# Patient Record
Sex: Male | Born: 1935
Health system: Southern US, Community
[De-identification: ages and names within clinical notes are randomized; demographics above are authoritative.]

## PROBLEM LIST (undated history)

## (undated) DIAGNOSIS — R590 Localized enlarged lymph nodes: Secondary | ICD-10-CM

## (undated) DIAGNOSIS — I421 Obstructive hypertrophic cardiomyopathy: Secondary | ICD-10-CM

## (undated) DIAGNOSIS — E7211 Homocystinuria: Secondary | ICD-10-CM

## (undated) DIAGNOSIS — I639 Cerebral infarction, unspecified: Secondary | ICD-10-CM

## (undated) DIAGNOSIS — I2510 Atherosclerotic heart disease of native coronary artery without angina pectoris: Secondary | ICD-10-CM

## (undated) DIAGNOSIS — Z9889 Other specified postprocedural states: Secondary | ICD-10-CM

## (undated) DIAGNOSIS — N2889 Other specified disorders of kidney and ureter: Secondary | ICD-10-CM

## (undated) DIAGNOSIS — I700 Atherosclerosis of aorta: Secondary | ICD-10-CM

## (undated) DIAGNOSIS — R519 Headache, unspecified: Secondary | ICD-10-CM

## (undated) DIAGNOSIS — R51 Headache: Secondary | ICD-10-CM

## (undated) DIAGNOSIS — I350 Nonrheumatic aortic (valve) stenosis: Secondary | ICD-10-CM

## (undated) DIAGNOSIS — I340 Nonrheumatic mitral (valve) insufficiency: Secondary | ICD-10-CM

## (undated) DIAGNOSIS — T753XXA Motion sickness, initial encounter: Secondary | ICD-10-CM

## (undated) DIAGNOSIS — K264 Chronic or unspecified duodenal ulcer with hemorrhage: Secondary | ICD-10-CM

## (undated) DIAGNOSIS — R7983 Abnormal findings of blood amino-acid level: Secondary | ICD-10-CM

## (undated) DIAGNOSIS — I442 Atrioventricular block, complete: Secondary | ICD-10-CM

## (undated) DIAGNOSIS — R7989 Other specified abnormal findings of blood chemistry: Secondary | ICD-10-CM

## (undated) DIAGNOSIS — C679 Malignant neoplasm of bladder, unspecified: Secondary | ICD-10-CM

## (undated) DIAGNOSIS — G4733 Obstructive sleep apnea (adult) (pediatric): Secondary | ICD-10-CM

## (undated) DIAGNOSIS — R2681 Unsteadiness on feet: Secondary | ICD-10-CM

## (undated) DIAGNOSIS — H8301 Labyrinthitis, right ear: Secondary | ICD-10-CM

## (undated) DIAGNOSIS — I4891 Unspecified atrial fibrillation: Secondary | ICD-10-CM

## (undated) DIAGNOSIS — G4730 Sleep apnea, unspecified: Secondary | ICD-10-CM

## (undated) DIAGNOSIS — R110 Nausea: Secondary | ICD-10-CM

## (undated) DIAGNOSIS — E1140 Type 2 diabetes mellitus with diabetic neuropathy, unspecified: Secondary | ICD-10-CM

## (undated) DIAGNOSIS — R748 Abnormal levels of other serum enzymes: Secondary | ICD-10-CM

## (undated) DIAGNOSIS — S0990XA Unspecified injury of head, initial encounter: Secondary | ICD-10-CM

## (undated) DIAGNOSIS — I739 Peripheral vascular disease, unspecified: Secondary | ICD-10-CM

## (undated) DIAGNOSIS — I951 Orthostatic hypotension: Secondary | ICD-10-CM

## (undated) DIAGNOSIS — I779 Disorder of arteries and arterioles, unspecified: Secondary | ICD-10-CM

## (undated) DIAGNOSIS — R61 Generalized hyperhidrosis: Secondary | ICD-10-CM

## (undated) DIAGNOSIS — K922 Gastrointestinal hemorrhage, unspecified: Secondary | ICD-10-CM

## (undated) DIAGNOSIS — E7800 Pure hypercholesterolemia, unspecified: Secondary | ICD-10-CM

## (undated) DIAGNOSIS — I10 Essential (primary) hypertension: Secondary | ICD-10-CM

## (undated) HISTORY — DX: Atherosclerotic heart disease of native coronary artery without angina pectoris: I25.10

## (undated) HISTORY — DX: Nausea: R11.0

## (undated) HISTORY — DX: Nonrheumatic aortic (valve) stenosis: I35.0

## (undated) HISTORY — DX: Localized enlarged lymph nodes: R59.0

## (undated) HISTORY — DX: Sleep apnea, unspecified: G47.30

## (undated) HISTORY — DX: Labyrinthitis, right ear: H83.01

## (undated) HISTORY — DX: Atherosclerosis of aorta: I70.0

## (undated) HISTORY — PX: INSERT / REPLACE / REMOVE PACEMAKER: SUR710

## (undated) HISTORY — DX: Chronic or unspecified duodenal ulcer with hemorrhage: K26.4

## (undated) HISTORY — DX: Gastrointestinal hemorrhage, unspecified: K92.2

## (undated) HISTORY — DX: Obstructive sleep apnea (adult) (pediatric): G47.33

## (undated) HISTORY — DX: Atrioventricular block, complete: I44.2

## (undated) HISTORY — DX: Obstructive hypertrophic cardiomyopathy: I42.1

## (undated) HISTORY — DX: Malignant neoplasm of bladder, unspecified: C67.9

## (undated) HISTORY — PX: BLADDER SURGERY: SHX569

## (undated) HISTORY — DX: Pure hypercholesterolemia, unspecified: E78.00

## (undated) HISTORY — DX: Unsteadiness on feet: R26.81

## (undated) HISTORY — DX: Unspecified injury of head, initial encounter: S09.90XA

## (undated) HISTORY — DX: Other specified abnormal findings of blood chemistry: R79.89

## (undated) HISTORY — DX: Abnormal levels of other serum enzymes: R74.8

## (undated) HISTORY — DX: Peripheral vascular disease, unspecified: I73.9

## (undated) HISTORY — PX: TONSILLECTOMY: SUR1361

## (undated) HISTORY — DX: Other specified disorders of kidney and ureter: N28.89

## (undated) HISTORY — DX: Nonrheumatic mitral (valve) insufficiency: I34.0

## (undated) HISTORY — PX: KIDNEY SURGERY: SHX687

## (undated) HISTORY — DX: Cerebral infarction, unspecified: I63.9

## (undated) HISTORY — PX: VALVE REPLACEMENT: SUR13

## (undated) HISTORY — DX: Unspecified atrial fibrillation: I48.91

## (undated) HISTORY — DX: Homocystinuria: E72.11

## (undated) HISTORY — DX: Abnormal findings of blood amino-acid level: R79.83

## (undated) HISTORY — DX: Other specified postprocedural states: Z98.890

## (undated) HISTORY — DX: Disorder of arteries and arterioles, unspecified: I77.9

## (undated) HISTORY — DX: Orthostatic hypotension: I95.1

## (undated) HISTORY — DX: Generalized hyperhidrosis: R61

---

## 1999-06-22 ENCOUNTER — Inpatient Hospital Stay (HOSPITAL_COMMUNITY): Admission: AD | Admit: 1999-06-22 | Discharge: 1999-06-24 | Payer: Self-pay | Admitting: Cardiology

## 2002-09-09 ENCOUNTER — Ambulatory Visit (HOSPITAL_COMMUNITY): Admission: RE | Admit: 2002-09-09 | Discharge: 2002-09-09 | Payer: Self-pay | Admitting: Cardiology

## 2002-11-14 ENCOUNTER — Inpatient Hospital Stay (HOSPITAL_COMMUNITY): Admission: AD | Admit: 2002-11-14 | Discharge: 2002-11-20 | Payer: Self-pay | Admitting: Neurosurgery

## 2002-11-15 ENCOUNTER — Encounter: Payer: Self-pay | Admitting: Neurosurgery

## 2002-11-17 ENCOUNTER — Encounter: Payer: Self-pay | Admitting: Neurosurgery

## 2002-12-08 ENCOUNTER — Ambulatory Visit (HOSPITAL_COMMUNITY): Admission: RE | Admit: 2002-12-08 | Discharge: 2002-12-08 | Payer: Self-pay | Admitting: Neurosurgery

## 2002-12-08 ENCOUNTER — Encounter: Payer: Self-pay | Admitting: Neurosurgery

## 2004-08-17 ENCOUNTER — Ambulatory Visit: Payer: Self-pay | Admitting: Internal Medicine

## 2004-08-25 ENCOUNTER — Ambulatory Visit: Payer: Self-pay | Admitting: Cardiology

## 2004-09-18 ENCOUNTER — Ambulatory Visit: Payer: Self-pay | Admitting: Cardiology

## 2004-10-03 ENCOUNTER — Ambulatory Visit: Payer: Self-pay | Admitting: Internal Medicine

## 2004-10-18 ENCOUNTER — Ambulatory Visit: Payer: Self-pay | Admitting: Cardiology

## 2004-11-07 ENCOUNTER — Ambulatory Visit: Payer: Self-pay | Admitting: Cardiology

## 2004-11-29 ENCOUNTER — Ambulatory Visit: Payer: Self-pay | Admitting: Cardiovascular Disease

## 2004-12-27 ENCOUNTER — Ambulatory Visit: Payer: Self-pay | Admitting: *Deleted

## 2005-01-09 ENCOUNTER — Ambulatory Visit: Payer: Self-pay | Admitting: Cardiology

## 2005-01-24 ENCOUNTER — Ambulatory Visit: Payer: Self-pay | Admitting: Internal Medicine

## 2005-02-21 ENCOUNTER — Ambulatory Visit: Payer: Self-pay | Admitting: Cardiology

## 2005-03-27 ENCOUNTER — Ambulatory Visit: Payer: Self-pay | Admitting: Internal Medicine

## 2005-05-01 ENCOUNTER — Ambulatory Visit: Payer: Self-pay | Admitting: Cardiology

## 2005-05-29 ENCOUNTER — Ambulatory Visit: Payer: Self-pay | Admitting: Cardiology

## 2005-06-19 ENCOUNTER — Ambulatory Visit: Payer: Self-pay | Admitting: Cardiology

## 2005-07-18 ENCOUNTER — Ambulatory Visit: Payer: Self-pay | Admitting: Cardiology

## 2005-08-22 ENCOUNTER — Ambulatory Visit: Payer: Self-pay | Admitting: Cardiology

## 2005-09-17 ENCOUNTER — Ambulatory Visit: Payer: Self-pay | Admitting: Cardiology

## 2005-10-10 ENCOUNTER — Ambulatory Visit: Payer: Self-pay | Admitting: *Deleted

## 2005-10-24 ENCOUNTER — Ambulatory Visit: Payer: Self-pay | Admitting: *Deleted

## 2005-11-21 ENCOUNTER — Ambulatory Visit: Payer: Self-pay | Admitting: Cardiology

## 2005-12-12 ENCOUNTER — Ambulatory Visit: Payer: Self-pay | Admitting: Internal Medicine

## 2006-01-02 ENCOUNTER — Ambulatory Visit: Payer: Self-pay | Admitting: Cardiology

## 2006-01-30 ENCOUNTER — Ambulatory Visit: Payer: Self-pay | Admitting: *Deleted

## 2006-02-27 ENCOUNTER — Ambulatory Visit: Payer: Self-pay | Admitting: Cardiology

## 2006-03-27 ENCOUNTER — Ambulatory Visit: Payer: Self-pay | Admitting: Cardiovascular Disease

## 2006-04-24 ENCOUNTER — Ambulatory Visit: Payer: Self-pay | Admitting: Internal Medicine

## 2006-05-03 ENCOUNTER — Ambulatory Visit: Payer: Self-pay | Admitting: Cardiology

## 2006-05-15 ENCOUNTER — Ambulatory Visit: Payer: Self-pay | Admitting: Cardiology

## 2006-05-24 ENCOUNTER — Ambulatory Visit: Payer: Self-pay | Admitting: Cardiology

## 2006-06-21 ENCOUNTER — Ambulatory Visit: Payer: Self-pay | Admitting: Cardiology

## 2006-07-12 ENCOUNTER — Ambulatory Visit: Payer: Self-pay | Admitting: Cardiology

## 2006-07-26 ENCOUNTER — Ambulatory Visit: Payer: Self-pay | Admitting: Cardiology

## 2006-08-26 ENCOUNTER — Ambulatory Visit: Payer: Self-pay | Admitting: Cardiovascular Disease

## 2006-09-18 ENCOUNTER — Ambulatory Visit: Payer: Self-pay | Admitting: Cardiology

## 2006-10-16 ENCOUNTER — Ambulatory Visit: Payer: Self-pay | Admitting: *Deleted

## 2006-11-13 ENCOUNTER — Ambulatory Visit: Payer: Self-pay | Admitting: Internal Medicine

## 2006-11-27 ENCOUNTER — Ambulatory Visit: Payer: Self-pay | Admitting: *Deleted

## 2006-12-25 ENCOUNTER — Ambulatory Visit: Payer: Self-pay | Admitting: Cardiology

## 2007-01-01 ENCOUNTER — Ambulatory Visit: Payer: Self-pay | Admitting: Cardiovascular Disease

## 2007-01-27 ENCOUNTER — Ambulatory Visit: Payer: Self-pay | Admitting: Cardiology

## 2007-03-06 ENCOUNTER — Ambulatory Visit: Payer: Self-pay | Admitting: Cardiology

## 2007-03-21 ENCOUNTER — Ambulatory Visit: Payer: Self-pay | Admitting: Cardiology

## 2007-04-08 ENCOUNTER — Ambulatory Visit: Payer: Self-pay | Admitting: Cardiology

## 2007-05-06 ENCOUNTER — Ambulatory Visit: Payer: Self-pay | Admitting: Internal Medicine

## 2007-06-04 ENCOUNTER — Ambulatory Visit: Payer: Self-pay | Admitting: Cardiology

## 2007-07-02 ENCOUNTER — Ambulatory Visit: Payer: Self-pay | Admitting: Internal Medicine

## 2007-07-16 ENCOUNTER — Ambulatory Visit: Payer: Self-pay | Admitting: Cardiology

## 2007-08-12 ENCOUNTER — Ambulatory Visit: Payer: Self-pay | Admitting: Cardiology

## 2007-08-20 ENCOUNTER — Ambulatory Visit: Payer: Self-pay | Admitting: Cardiology

## 2007-09-09 ENCOUNTER — Ambulatory Visit: Payer: Self-pay | Admitting: Internal Medicine

## 2007-10-06 ENCOUNTER — Ambulatory Visit: Payer: Self-pay | Admitting: Cardiology

## 2007-11-03 ENCOUNTER — Ambulatory Visit: Payer: Self-pay | Admitting: Cardiology

## 2007-12-01 ENCOUNTER — Ambulatory Visit: Payer: Self-pay | Admitting: Cardiovascular Disease

## 2007-12-22 ENCOUNTER — Ambulatory Visit: Payer: Self-pay | Admitting: Cardiology

## 2008-01-19 ENCOUNTER — Ambulatory Visit: Payer: Self-pay | Admitting: Cardiovascular Disease

## 2008-02-09 ENCOUNTER — Ambulatory Visit: Payer: Self-pay | Admitting: Cardiovascular Disease

## 2008-02-16 ENCOUNTER — Ambulatory Visit: Payer: Self-pay | Admitting: Cardiology

## 2008-03-05 ENCOUNTER — Ambulatory Visit: Payer: Self-pay | Admitting: Cardiovascular Disease

## 2008-04-13 ENCOUNTER — Ambulatory Visit: Payer: Self-pay | Admitting: Cardiology

## 2008-05-07 ENCOUNTER — Ambulatory Visit: Payer: Self-pay | Admitting: Cardiology

## 2008-06-04 ENCOUNTER — Ambulatory Visit: Payer: Self-pay | Admitting: Internal Medicine

## 2008-06-28 ENCOUNTER — Ambulatory Visit: Payer: Self-pay | Admitting: Cardiology

## 2008-07-23 ENCOUNTER — Ambulatory Visit: Payer: Self-pay | Admitting: Internal Medicine

## 2008-08-19 ENCOUNTER — Ambulatory Visit: Payer: Self-pay | Admitting: Cardiology

## 2008-08-27 ENCOUNTER — Encounter: Payer: Self-pay | Admitting: Cardiology

## 2008-08-27 ENCOUNTER — Ambulatory Visit: Payer: Self-pay

## 2008-09-15 ENCOUNTER — Ambulatory Visit: Payer: Self-pay | Admitting: Internal Medicine

## 2008-10-13 ENCOUNTER — Ambulatory Visit: Payer: Self-pay | Admitting: Internal Medicine

## 2008-11-08 ENCOUNTER — Ambulatory Visit: Payer: Self-pay | Admitting: Cardiology

## 2008-12-06 ENCOUNTER — Ambulatory Visit: Payer: Self-pay | Admitting: Cardiology

## 2009-01-03 ENCOUNTER — Ambulatory Visit: Payer: Self-pay | Admitting: Cardiology

## 2009-01-28 ENCOUNTER — Ambulatory Visit: Payer: Self-pay | Admitting: Internal Medicine

## 2009-02-25 ENCOUNTER — Ambulatory Visit: Payer: Self-pay | Admitting: Cardiology

## 2009-03-05 DIAGNOSIS — E119 Type 2 diabetes mellitus without complications: Secondary | ICD-10-CM | POA: Insufficient documentation

## 2009-03-05 DIAGNOSIS — I4891 Unspecified atrial fibrillation: Secondary | ICD-10-CM | POA: Insufficient documentation

## 2009-03-05 DIAGNOSIS — I4892 Unspecified atrial flutter: Secondary | ICD-10-CM

## 2009-03-05 DIAGNOSIS — E785 Hyperlipidemia, unspecified: Secondary | ICD-10-CM | POA: Insufficient documentation

## 2009-03-05 DIAGNOSIS — I482 Chronic atrial fibrillation, unspecified: Secondary | ICD-10-CM

## 2009-03-05 DIAGNOSIS — I080 Rheumatic disorders of both mitral and aortic valves: Secondary | ICD-10-CM | POA: Insufficient documentation

## 2009-03-07 ENCOUNTER — Ambulatory Visit: Payer: Self-pay | Admitting: Cardiology

## 2009-03-07 DIAGNOSIS — E663 Overweight: Secondary | ICD-10-CM | POA: Insufficient documentation

## 2009-03-15 ENCOUNTER — Encounter: Payer: Self-pay | Admitting: *Deleted

## 2009-03-25 ENCOUNTER — Ambulatory Visit: Payer: Self-pay | Admitting: Cardiovascular Disease

## 2009-03-25 LAB — CONVERTED CEMR LAB
POC INR: 2.2
Protime: 18.1

## 2009-03-31 ENCOUNTER — Encounter: Payer: Self-pay | Admitting: Cardiology

## 2009-04-20 ENCOUNTER — Encounter: Payer: Self-pay | Admitting: *Deleted

## 2009-04-20 ENCOUNTER — Ambulatory Visit: Payer: Self-pay | Admitting: Internal Medicine

## 2009-04-20 LAB — CONVERTED CEMR LAB
POC INR: 1.7
Prothrombin Time: 15.9 s

## 2009-05-11 ENCOUNTER — Ambulatory Visit: Payer: Self-pay | Admitting: Cardiology

## 2009-05-11 LAB — CONVERTED CEMR LAB
POC INR: 1.8
Prothrombin Time: 16.5 s

## 2009-05-30 ENCOUNTER — Ambulatory Visit: Payer: Self-pay | Admitting: Cardiovascular Disease

## 2009-05-30 LAB — CONVERTED CEMR LAB: POC INR: 2.1

## 2009-06-24 ENCOUNTER — Ambulatory Visit: Payer: Self-pay | Admitting: Internal Medicine

## 2009-06-24 LAB — CONVERTED CEMR LAB: POC INR: 2.1

## 2009-07-22 ENCOUNTER — Ambulatory Visit: Payer: Self-pay | Admitting: Internal Medicine

## 2009-07-22 LAB — CONVERTED CEMR LAB: POC INR: 2.5

## 2009-08-19 ENCOUNTER — Ambulatory Visit: Payer: Self-pay | Admitting: Cardiovascular Disease

## 2009-08-19 LAB — CONVERTED CEMR LAB: POC INR: 3.1

## 2009-08-25 ENCOUNTER — Encounter: Payer: Self-pay | Admitting: Cardiology

## 2009-08-26 ENCOUNTER — Ambulatory Visit: Payer: Self-pay | Admitting: Cardiology

## 2009-08-26 LAB — CONVERTED CEMR LAB: POC INR: 2.5

## 2009-09-05 ENCOUNTER — Encounter: Payer: Self-pay | Admitting: Cardiology

## 2009-09-23 ENCOUNTER — Ambulatory Visit: Payer: Self-pay | Admitting: Cardiology

## 2009-09-23 LAB — CONVERTED CEMR LAB: POC INR: 3

## 2009-09-30 ENCOUNTER — Ambulatory Visit: Payer: Self-pay | Admitting: Cardiovascular Disease

## 2009-09-30 LAB — CONVERTED CEMR LAB: POC INR: 2.9

## 2009-10-13 ENCOUNTER — Ambulatory Visit: Payer: Self-pay | Admitting: Internal Medicine

## 2009-10-13 LAB — CONVERTED CEMR LAB: POC INR: 3.2

## 2009-10-21 ENCOUNTER — Ambulatory Visit: Payer: Self-pay | Admitting: Cardiology

## 2009-11-14 ENCOUNTER — Ambulatory Visit: Payer: Self-pay | Admitting: Cardiology

## 2009-12-02 ENCOUNTER — Ambulatory Visit: Payer: Self-pay | Admitting: Cardiology

## 2009-12-02 LAB — CONVERTED CEMR LAB: POC INR: 2.2

## 2009-12-30 ENCOUNTER — Ambulatory Visit: Payer: Self-pay | Admitting: Cardiovascular Disease

## 2010-01-27 ENCOUNTER — Ambulatory Visit: Payer: Self-pay | Admitting: Internal Medicine

## 2010-02-17 ENCOUNTER — Ambulatory Visit: Payer: Self-pay | Admitting: Cardiology

## 2010-02-24 ENCOUNTER — Ambulatory Visit: Payer: Self-pay | Admitting: Cardiology

## 2010-02-26 ENCOUNTER — Emergency Department: Payer: Self-pay | Admitting: Emergency Medicine

## 2010-03-01 ENCOUNTER — Emergency Department: Payer: Self-pay | Admitting: Emergency Medicine

## 2010-03-05 ENCOUNTER — Emergency Department: Payer: Self-pay

## 2010-03-12 ENCOUNTER — Emergency Department: Payer: Self-pay | Admitting: Emergency Medicine

## 2010-03-24 ENCOUNTER — Ambulatory Visit: Payer: Self-pay | Admitting: Cardiovascular Disease

## 2010-04-20 ENCOUNTER — Ambulatory Visit: Payer: Self-pay | Admitting: Internal Medicine

## 2010-04-20 LAB — CONVERTED CEMR LAB: POC INR: 2.6

## 2010-05-18 ENCOUNTER — Ambulatory Visit: Payer: Self-pay | Admitting: Cardiovascular Disease

## 2010-06-16 ENCOUNTER — Ambulatory Visit: Payer: Self-pay | Admitting: Cardiology

## 2010-06-16 LAB — CONVERTED CEMR LAB: POC INR: 2.6

## 2010-06-30 ENCOUNTER — Ambulatory Visit: Payer: Self-pay | Admitting: Cardiology

## 2010-06-30 LAB — CONVERTED CEMR LAB: POC INR: 2.3

## 2010-07-28 ENCOUNTER — Ambulatory Visit: Payer: Self-pay | Admitting: Cardiology

## 2010-07-28 LAB — CONVERTED CEMR LAB: POC INR: 2.4

## 2010-08-28 ENCOUNTER — Ambulatory Visit: Payer: Self-pay | Admitting: Cardiovascular Disease

## 2010-08-28 ENCOUNTER — Observation Stay: Payer: Self-pay | Admitting: *Deleted

## 2010-09-01 ENCOUNTER — Ambulatory Visit: Payer: Self-pay | Admitting: Cardiology

## 2010-09-01 DIAGNOSIS — Z8679 Personal history of other diseases of the circulatory system: Secondary | ICD-10-CM | POA: Insufficient documentation

## 2010-09-29 ENCOUNTER — Ambulatory Visit: Payer: Self-pay | Admitting: Cardiology

## 2010-09-29 LAB — CONVERTED CEMR LAB: POC INR: 1.9

## 2010-10-11 ENCOUNTER — Ambulatory Visit: Payer: Self-pay | Admitting: Cardiology

## 2010-10-27 ENCOUNTER — Ambulatory Visit: Admission: RE | Admit: 2010-10-27 | Discharge: 2010-10-27 | Payer: Self-pay | Source: Home / Self Care

## 2010-10-27 LAB — CONVERTED CEMR LAB
INR: 2.6
POC INR: 2.6

## 2010-11-15 ENCOUNTER — Encounter: Payer: Self-pay | Admitting: Cardiology

## 2010-11-15 ENCOUNTER — Ambulatory Visit: Payer: Self-pay | Admitting: Ophthalmology

## 2010-11-16 NOTE — Medication Information (Signed)
Summary: rov/ewj  Anticoagulant Therapy  Managed by: Cloyde Reams, RN Referring MD: Willa Rough MD PCP: DR Donnetta Hutching MD: Shirlee Latch MD, Omarri Eich Indication 1: Atrial Fibrillation (ICD-427.31) Indication 2: Cardiomyopathy (ICD-425) Lab Used: [facility] Site: [facility] INR POC 2.2 INR RANGE 2 - 2.5    Bleeding/hemorrhagic complications: no    Recent/future hospitalizations: no    Any changes in medication regimen? yes       Details: Took prednisone 5 day taper for gout.  Completed  Recent/future dental: no  Any missed doses?: no       Is patient compliant with meds? yes       Allergies (verified): 1)  ! * Mycians  Anticoagulation Management History:      The patient is taking warfarin and comes in today for a routine follow up visit.  Positive risk factors for bleeding include an age of 75 years or older and presence of serious comorbidities.  The bleeding index is 'intermediate risk'.  Positive CHADS2 values include History of Diabetes.  Negative CHADS2 values include Age > 52 years old.  The start date was 02/15/1998.  His last INR was 3.  Anticoagulation responsible provider: Shirlee Latch MD, Sumaiya Arruda.  INR POC: 2.2.  Cuvette Lot#: [id number].  Exp: 01/2011.    Anticoagulation Management Assessment/Plan:      The patient's current anticoagulation dose is Coumadin 5 mg tabs: take as directed.  The target INR is 2 - 2.5.  The next INR is due 12/30/2009.  Anticoagulation instructions were given to patient/spouse.  Results were reviewed/authorized by Cloyde Reams, RN.  He was notified by Cloyde Reams RN.         Prior Anticoagulation Instructions: INR 2.2  Continue on same dosage 1 tablet daily except 1/2 tablet on Sundays, Wednesdays, and Fridays.  Recheck in 3 weeks.    Current Anticoagulation Instructions: INR 2.2  Continue on same dosage 1 tablet daily except 1/2 tablet on Sundays, Wednesdays, and Fridays.  Recheck in 4 weeks.

## 2010-11-16 NOTE — Medication Information (Signed)
Summary: rov/ewj  Anticoagulant Therapy  Managed by: Eda Keys, PharmD Referring MD: Willa Rough MD PCP: DR Donnetta Hutching MD: Clifton James MD, Cristal Deer Indication 1: Atrial Fibrillation (ICD-427.31) Indication 2: Cardiomyopathy (ICD-425) Lab Used: [facility] Site: [facility] INR RANGE 2 - 2.5  Dietary changes: no    Health status changes: no    Bleeding/hemorrhagic complications: no    Recent/future hospitalizations: no    Any changes in medication regimen? yes       Details: px dose of abx before dental procedure.  Recent/future dental: no  Any missed doses?: no       Is patient compliant with meds? yes       Allergies: 1)  ! * Mycians  Anticoagulation Management History:      The patient is taking warfarin and comes in today for a routine follow up visit.  Positive risk factors for bleeding include an age of 75 years or older and presence of serious comorbidities.  The bleeding index is 'intermediate risk'.  Positive CHADS2 values include History of Diabetes.  Negative CHADS2 values include Age > 75 years old.  The start date was 02/15/1998.  His last INR was 3.  Anticoagulation responsible provider: Clifton James MD, Cristal Deer.  Cuvette Lot#: [id number].  Exp: 02/2011.    Anticoagulation Management Assessment/Plan:      The patient's current anticoagulation dose is Coumadin 5 mg tabs: take as directed.  The target INR is 2 - 2.5.  The next INR is due 01/27/2010.  Anticoagulation instructions were given to patient/spouse.  Results were reviewed/authorized by Eda Keys, PharmD.  He was notified by Eda Keys.         Prior Anticoagulation Instructions: INR 2.2  Continue on same dosage 1 tablet daily except 1/2 tablet on Sundays, Wednesdays, and Fridays.  Recheck in 4 weeks.    Current Anticoagulation Instructions: INR 2.5  Continue taking 1/2 tablet on Sunday, Wednesday, and Friday, and 1 tablet all other days.  Return to clinic in 4 weeks.

## 2010-11-16 NOTE — Medication Information (Signed)
Summary: rov/ewj  Anticoagulant Therapy  Managed by: Weston Brass, PharmD Referring MD: Willa Rough MD PCP: DR Donnetta Hutching MD: Tenny Craw MD, Gunnar Fusi Indication 1: Atrial Fibrillation (ICD-427.31) Indication 2: Cardiomyopathy (ICD-425) Lab Used: [facility] Site: [facility] INR POC 2.6 INR RANGE 2 - 2.5  Dietary changes: no    Health status changes: no    Bleeding/hemorrhagic complications: no    Recent/future hospitalizations: no    Any changes in medication regimen? no    Recent/future dental: no  Any missed doses?: no       Is patient compliant with meds? yes       Allergies: 1)  ! * Mycians  Anticoagulation Management History:      Positive risk factors for bleeding include an age of 75 years or older and presence of serious comorbidities.  The bleeding index is 'intermediate risk'.  Positive CHADS2 values include History of Diabetes.  Negative CHADS2 values include Age > 71 years old.  The start date was 02/15/1998.  His last INR was 3.  Anticoagulation responsible provider: Tenny Craw MD, Gunnar Fusi.  INR POC: 2.6.  Cuvette Lot#: [id number].  Exp: 07/2011.    Anticoagulation Management Assessment/Plan:      The patient's current anticoagulation dose is Coumadin 5 mg tabs: take as directed.  The target INR is 2 - 2.5.  The next INR is due 06/30/2010.  Anticoagulation instructions were given to patient/spouse.  Results were reviewed/authorized by Weston Brass, PharmD.  He was notified by Kennieth Francois.         Prior Anticoagulation Instructions: INR 1.9  Take 1 tablet tomorrow, then resume same dosage 1 tablet daily except 1/2 tablet on Sundays, Wednesdays, and Fridays.  Recheck in 4 weeks.    Current Anticoagulation Instructions: INR 2.6  Continue taking one tablet every day except for one-half tablet on Sunday, Wendesday, and Friday.  Because you are going on vacation, we will recheck your INR in two weeks.

## 2010-11-16 NOTE — Medication Information (Signed)
Summary: ROV/LB  Anticoagulant Therapy  Managed by: Cloyde Reams, RN Referring MD: Willa Rough MD PCP: DR Donnetta Hutching MD: Antoine Poche MD, Fayrene Fearing Indication 1: Atrial Fibrillation (ICD-427.31) Indication 2: Cardiomyopathy (ICD-425) Lab Used: [facility] Site: [facility] INR POC 2.2 INR RANGE 2 - 2.5  Dietary changes: no    Health status changes: no    Bleeding/hemorrhagic complications: no    Recent/future hospitalizations: no    Any changes in medication regimen? yes       Details: Has not taken his usual vitamins and supplements in the past 3 weeks but is going to restart.  Recent/future dental: no  Any missed doses?: no       Is patient compliant with meds? yes       Allergies (verified): 1)  ! * Mycians  Anticoagulation Management History:      The patient is taking warfarin and comes in today for a routine follow up visit.  Positive risk factors for bleeding include an age of 19 years or older and presence of serious comorbidities.  The bleeding index is 'intermediate risk'.  Positive CHADS2 values include History of Diabetes.  Negative CHADS2 values include Age > 72 years old.  The start date was 02/15/1998.  His last INR was 3.  Anticoagulation responsible provider: Antoine Poche MD, Fayrene Fearing.  INR POC: 2.2.  Cuvette Lot#: [id number].  Exp: 11/2010.    Anticoagulation Management Assessment/Plan:      The patient's current anticoagulation dose is Coumadin 5 mg tabs: take as directed.  The target INR is 2 - 2.5.  The next INR is due 11/14/2009.  Anticoagulation instructions were given to patient/spouse.  Results were reviewed/authorized by Cloyde Reams, RN.  He was notified by Lew Dawes, PharmD Candidate.         Prior Anticoagulation Instructions: INR 3.2  NO COUMADIN TONIGHT THEN START TAKING 1 TABLET EVERYDAY EXCEPT TAKE 0.5 TABLETS ON SUNDAYS, WEDNESDAYS, AND FRIDAYS.  Current Anticoagulation Instructions: INR 2.2  Continue same dose of 1 tablet daily except  Sundays, Wednesdays, and Fridays. Patient is going out of town and will not return until 1/31. Recheck 1/31.

## 2010-11-16 NOTE — Assessment & Plan Note (Addendum)
Summary: 6wk f/u sl   Visit Type:  Follow-up Primary Provider:  [name], MD  CC:  orthostatic hypotension.  History of Present Illness: Patient is seen for followup his episode of orthostatic hypotension.  I saw him last September 01, 2010.  He had an episode and was assessed at [facility].  He had a CT scan and MRI that showed 2 old small strokes on the right side.  I have been trying to get these studies and we will try again.  I will then have them compared to his prior study at [facility].  He is feeling well.  He is now having recurrent symptoms.  I have encouraged him to liberalize his salt intake.  In the meantime he has gained some real weight and fluid weight.  Current Medications (verified): 1)  Co Q-10 30 Mg  Caps (Coenzyme Q10) .[name].. 1 Tab Once Daily 2)  Fish Oil   Oil (Fish Oil) .[name].. 1 Tab Two Times A Day 3)  Vitamin E 600 Unit  Caps (Vitamin E) .[name].. 1 Tab Qd 4)  Grape Seed Capsule .[name].. 1 Two Times A Day 5)  Gelatin Capsule .[name].. 4 Tabs Two Times A Day 6)  Calcium 600/vitamin D 600-400 Mg-Unit Tabs (Calcium Carbonate-Vitamin D) .[name].. 1 Tab Once Daily 7)  Glucosamine Chrondrotin .... Daily 8)  Vitamin B-12 500 Mcg  Tabs (Cyanocobalamin) .[name].. 1 Tab Once Daily 9)  Otc Potassium .[name].. 1 Once Daily 10)  Vitamin C 500 Mg  Tabs (Ascorbic Acid) .[name].. 1 Tab Once Daily 11)  Multivitamins   Tabs (Multiple Vitamin) .[name].. 1 Tab Qd 12)  Simvastatin 40 Mg Tabs (Simvastatin) .... Take One-Half Tablet By Mouth Daily At Bedtime 13)  Metformin Hcl 500 Mg Tabs (Metformin Hcl) .... Take 1 Tab Once Daily 14)  Verapamil Hcl Cr 240 Mg Tbcr (Verapamil Hcl) .[name].. 1 By Mouth Two Times A Day 15)  Coumadin 5 Mg Tabs (Warfarin Sodium) .... Take As Directed 16)  Levothroid 50 Mcg Tabs (Levothyroxine Sodium) .... Once Daily 17)  Lumigan Eye Drops .Marland KitchenMarland Kitchen. 1 Drop Once Daily 18)  Testosterone Shot 200mg  .... Every 3 Weeks 19)  Meclizine Hcl 25 Mg Tabs (Meclizine Hcl) .... As Needed  Allergies (verified): 1)   ! * Mycians  Past History:  Past Medical History:   Hypertrophic obstructive cardiomyopathy. .mild SAM of the mitral valve, but no LVOT obstruction.. echo.. November, 2009  mitral regurgitation, mild...echo... November, 2009.[name]  Coumadin, longterm.  head injury when he slipped on ice in the winter of 2004     to 2005.  He stabilized, and he is back on Coumadin. 5. Hypercholesterolemia, treated. 6. Atrial fibrillation and flutter.  He has a left atrial circuit that     is not ablated.  He was on amiodarone, but we stopped it, and we     use rate control.   7. History of some liver enzymes over time. 8. History of bladder cancer, treated with BCG in the past. 9. Diabetes. 10.TSH elevation on Synthroid historically. 11.Elevated homocystine that was mild. 12.Glaucoma. 13.Gout. 14.Status post laparoscopic surgery with cryoablation of a mass     outside the kidney [facility]. 15.Some atherosclerosis of the aorta by CT scan in the past. Orthostatic hypotension      dehydration... hospitalization.... 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  Review of Systems       Patient denies fever, chills, headache, sweats, rash, vision, change  in hearing, chest pain, cough, nausea vomiting, urinary symptoms.  All other systems are reviewed and are negative.  Vital Signs:  Patient profile:   77 year old male Height:      70 inches Weight:      218.50 pounds BMI:     31.46 Pulse rate:   76 / minute BP sitting:   124 / 76  (left arm) Cuff size:   regular  Vitals Entered By: Haze Boyden, CMA (October 11, 2010 9:45 AM)  Physical Exam  General:  patient is stable today. Eyes:  no xanthelasma. Neck:  no jugular venous distention. Lungs:  lungs are clear.  Respiratory effort is nonlabored. Heart:  cardiac exam reveals S1-S2.  No clicks or significant murmurs. Abdomen:  abdomen is soft. Extremities:  no peripheral edema. Psych:  patient  is oriented to person time and place.  Affect is normal.   Impression & Recommendations:  Problem # 1:  * "2 OLD STROKES" NOVEMBER, 2011 We will continue to look for his CT scan and MRI and carotid Dopplers from [facility].  Problem # 2:  ORTHOSTATIC HYPOTENSION, HX OF (ICD-V12.50) He is not having any recurrent symptoms.  He probably has a little too much salt on board and he will find an intermediate range for his intake.  When the patient was seen last time there was a comment that he appeared to have a reddish in appearance.  Carboxyhemoglobin was checked to be sure that there was no problem from carbon monoxide and this was normal.  No further workup.  Problem # 3:  ATRIAL FIBRILLATION (ICD-427.31)  His updated medication list for this problem includes:    Coumadin 5 Mg Tabs (Warfarin sodium) .[name]... Take as directed Atrial fib rate is controlled.  No change in therapy.  Patient Instructions: 1)  Your physician has requested that you limit the intake of sodium (salt) in your diet. Please see [facility] handout. 2)  Follow up in 3 months.  Appended Document: 6wk f/u sl  Heather to discuss with Pearson Forster, please talk to me about getting [facility] data.

## 2010-11-16 NOTE — Medication Information (Signed)
Summary: [name] [name]  Anticoagulant Therapy  Managed by: Cloyde Reams, RN, BSN Referring MD: Willa Rough MD PCP: DR Donnetta Hutching MD: Riley Kill MD, Maisie Fus Indication 1: Atrial Fibrillation (ICD-427.31) Indication 2: Cardiomyopathy (ICD-425) Lab Used: [facility] Site: [facility] INR POC 2.4 INR RANGE 2 - 2.5  Dietary changes: yes       Details: Diet has varied secondary to being out of town x 2 weeks.   Health status changes: no    Bleeding/hemorrhagic complications: no    Recent/future hospitalizations: no    Any changes in medication regimen? no    Recent/future dental: no  Any missed doses?: no       Is patient compliant with meds? yes       Allergies: 1)  ! * Mycians  Anticoagulation Management History:      The patient is taking warfarin and comes in today for a routine follow up visit.  Positive risk factors for bleeding include an age of 75 years or older and presence of serious comorbidities.  The bleeding index is 'intermediate risk'.  Positive CHADS2 values include History of Diabetes.  Negative CHADS2 values include Age > 39 years old.  The start date was 02/15/1998.  His last INR was 3.  Anticoagulation responsible provider: Riley Kill MD, Maisie Fus.  INR POC: 2.4.  Cuvette Lot#: [id number].  Exp: 08/2011.    Anticoagulation Management Assessment/Plan:      The patient's current anticoagulation dose is Coumadin 5 mg tabs: take as directed.  The target INR is 2 - 2.5.  The next INR is due 09/01/2010.  Anticoagulation instructions were given to patient/spouse.  Results were reviewed/authorized by Cloyde Reams, RN, BSN.  He was notified by Cloyde Reams RN.         Prior Anticoagulation Instructions: INR 2.3 Continue taking 1 tablet on monday, tuesday, thursday, and saturday. Take a half of a tablet on sunday, wednesday, and friday. See you in 4 weeks.  Current Anticoagulation Instructions: INR 2.4  Continue on same dosage 1 tablet daily except 1/2 tablet on  Sundays, Wednesdays, and Fridays.  Recheck in 4 weeks.

## 2010-11-16 NOTE — Medication Information (Signed)
Summary: rov/ewj  Anticoagulant Therapy  Managed by: Bethena Midget, RN, BSN Referring MD: Willa Rough MD PCP: DR Donnetta Hutching MD: Antoine Poche MD, Fayrene Fearing Indication 1: Atrial Fibrillation (ICD-427.31) Indication 2: Cardiomyopathy (ICD-425) Lab Used: [facility] Site: [facility] INR POC 2.1 INR RANGE 2 - 2.5  Dietary changes: no    Health status changes: no    Bleeding/hemorrhagic complications: no    Recent/future hospitalizations: yes       Details: Was admitted to [facility] from Sun-Wed for dizziness.   Any changes in medication regimen? yes       Details: Was given ASA while admitted in hosp.   Recent/future dental: no  Any missed doses?: no       Is patient compliant with meds? yes      Comments: Seeing Dr Myrtis Ser today.   Allergies: 1)  ! * Mycians  Anticoagulation Management History:      The patient is taking warfarin and comes in today for a routine follow up visit.  Positive risk factors for bleeding include an age of 75 years or older and presence of serious comorbidities.  The bleeding index is 'intermediate risk'.  Positive CHADS2 values include History of Diabetes.  Negative CHADS2 values include Age > 82 years old.  The start date was 02/15/1998.  His last INR was 3.  Anticoagulation responsible provider: Antoine Poche MD, Fayrene Fearing.  INR POC: 2.1.  Cuvette Lot#: [id number].  Exp: 09/2011.    Anticoagulation Management Assessment/Plan:      The patient's current anticoagulation dose is Coumadin 5 mg tabs: take as directed.  The target INR is 2 - 2.5.  The next INR is due 09/29/2010.  Anticoagulation instructions were given to patient/spouse.  Results were reviewed/authorized by Bethena Midget, RN, BSN.  He was notified by Bethena Midget, RN, BSN.         Prior Anticoagulation Instructions: INR 2.4  Continue on same dosage 1 tablet daily except 1/2 tablet on Sundays, Wednesdays, and Fridays.  Recheck in 4 weeks.    Current Anticoagulation Instructions: INR  2.1 Continue 5mg s everyday except 2.5mg s on Sundays, Wednesdays,  and Fridays. Recheck in 4 weeks.

## 2010-11-16 NOTE — Assessment & Plan Note (Signed)
Summary: F6M/DFG   Visit Type:  Follow-up Primary [name] [name]:  [name] Manson, MD  CC:  orthostatic hypotension.  History of Present Illness: The patient is seen for followup of atrial fibrillation.  He has a history of hypertrophic obstructive cardiomyopathy.  There is family mitral valve but he has never had significant left ventricular outflow tract obstruction.  Recently he felt poorly and quite dizzy.  He was assessed at [facility] as an inpatient.  This occurred last week.  He describes having had a CT scan and an MRI that showed 2 old small strokes on the right side.  The patient's prior neurosurgical evaluation had been at [facility].  He describes having had an echo and was told that it looked good.  He also describes having had carotid Dopplers.  All of this data will be obtained.  Patient's wife says that he looked very "red with this.  He denies excess carbon monoxide.  Ultimately he received IV fluids and he felt much better.  He is not on a diuretic.  However he feels that he was dehydrated when he went to the hospital.  Historically he has been very careful with his salt intake although hypertension has never been a significant problem.  I have spent an extensive amount of time obtaining information from the patient is a wife about his recent hospitalization.  We're in the process of obtaining all of the records for review.  Current Medications (verified): 1)  Co Q-10 30 Mg  Caps (Coenzyme Q10) .[name].. 1 Tab Once Daily 2)  Fish Oil   Oil (Fish Oil) .[name].. 1 Tab Two Times A Day 3)  Vitamin E 600 Unit  Caps (Vitamin E) .[name].. 1 Tab Qd 4)  Grape Seed Capsule .[name].. 1 Two Times A Day 5)  Gelatin Capsule .[name].. 4 Tabs Two Times A Day 6)  Calcium 600/vitamin D 600-400 Mg-Unit Tabs (Calcium Carbonate-Vitamin D) .[name].. 1 Tab Once Daily 7)  Glucosamine Chrondrotin .... Daily 8)  Vitamin B-12 500 Mcg  Tabs (Cyanocobalamin) .[name].. 1 Tab Once Daily 9)  Otc Potassium .[name].. 1 Once  Daily 10)  Vitamin C 500 Mg  Tabs (Ascorbic Acid) .[name].. 1 Tab Once Daily 11)  Multivitamins   Tabs (Multiple Vitamin) .[name].. 1 Tab Qd 12)  Simvastatin 40 Mg Tabs (Simvastatin) .... Take One-Half Tablet By Mouth Daily At Bedtime 13)  Metformin Hcl 500 Mg Tabs (Metformin Hcl) .... Take 1 Tab Once Daily 14)  Verapamil Hcl Cr 240 Mg Tbcr (Verapamil Hcl) .[name].. 1 By Mouth Two Times A Day 15)  Coumadin 5 Mg Tabs (Warfarin Sodium) .... Take As Directed 16)  Levothroid 50 Mcg Tabs (Levothyroxine Sodium) .... Once Daily 17)  Lumigan Eye Drops .Marland KitchenMarland Kitchen. 1 Drop Once Daily 18)  Testosterone Shot 200mg  .... Every 3 Weeks  Allergies (verified): 1)  ! * Mycians  Past History:  Past Medical History:   Hypertrophic obstructive cardiomyopathy. .mild SAM of the mitral valve, but no LVOT obstruction.. echo.. November, 2009  mitral regurgitation, mild...echo... November, 2009  Coumadin, longterm.  head injury when he slipped on ice in the winter of 2004     to 2005.  He stabilized, and he is back on Coumadin. 5. Hypercholesterolemia, treated. 6. Atrial fibrillation and flutter.  He has a left atrial circuit that     is not ablated.  He was on amiodarone, but we stopped it, and we     use rate control.   7. History of some liver enzymes  over time. 8. History of bladder cancer, treated with BCG in the past. 9. Diabetes. 10.TSH elevation on Synthroid historically. 11.Elevated homocystine that was mild. 12.Glaucoma. 13.Gout. 14.Status post laparoscopic surgery with cryoablation of a mass     outside the kidney followed at [facility]. 15.Some atherosclerosis of the aorta by CT scan in the past.  Orthostatic hypotension      dehydration... hospitalization.... 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, [facility]  Review of Systems       Patient denies fever, chills, headache, sweats, rash, change in vision hearing, chest pain, cough, nausea vomiting, urinary symptoms.  All other systems  are reviewed and are negative.  Vital Signs:  Patient profile:   77 year old male Height:      70 inches Weight:      212 pounds BMI:     30.53 Pulse rate:   102 / minute BP sitting:   126 / 74  (left arm) Cuff size:   regular  Vitals Entered By: Hardin Negus, RMA (September 01, 2010 10:27 AM)  Physical Exam  General:  he looks good today. Head:  head is atraumatic. Eyes:  no xanthelasma. Neck:  no jugular venous distention. Chest Wall:  no chest wall tenderness. Lungs:  lungs are clear.  Respiratory effort is not labored. Heart:  cardiac exam reveals S1-S2.  No clicks or significant murmurs Abdomen:  abdomen is soft. Msk:  no musculoskeletal deformities. Extremities:  no frontal edema. Skin:  no skin rashes. Psych:  patient is oriented to person place affect is normal.   Impression & Recommendations:  Problem # 1:  * "2 OLD STROKES" NOVEMBER, 2011 Was hospitalized recently CT and MRI revealed "2 old strokes.  We will need data from [place] to compare with the prior information we have at [facility].  The patient is on Coumadin and this is the treatment of choice for his atrial fibrillation.  No other medications are needed  Problem # 2:  ORTHOSTATIC HYPOTENSION, HX OF (ICD-V12.50)  The patient had significant orthostatic hypotension recently.  He is not on a diuretic.  He has been careful not to be much salt.  I've encouraged him to liberalize his salt and his fluid intake.  I feel we should not add any other medications at this time.  Because the patient wife repeats multiple times how "red he looks" we will obtain carbon monoxide level.  Orders: Fransico Michael Blood Carbon Monoxide [phone])  Problem # 3:  ATRIAL FIBRILLATION (ICD-427.31)  His updated medication list for this problem includes:    Coumadin 5 Mg Tabs (Warfarin sodium) .[name]... Take as directed  Orders: EKG w/ Interpretation (93000) Atrial fibrillation is chronic.  EKG is done today reviewed by me.  He  has old poor anterior R wave progression.  No significant change is seen.  No change in therapy at this time.  Problem # 4:  * COUMADIN RX Coumadin is discontinued.  No change in therapy.  Patient Instructions: 1)  We have sent in a prescription for Meclizine 25mg  for you to take when needed 2)  Lab today 3)  Follow up in 6 weeks Prescriptions: MECLIZINE HCL 25 MG TABS (MECLIZINE HCL) as needed  #30 x 3   Entered by:   Meredith Staggers, RN   Authorized by:   Talitha Givens, MD, [name]   Signed by:   Meredith Staggers, RN on 09/01/2010   Method used:   Electronically to  [facility]  [address] (retail)       [address], [address]       [place], [place]  [address]       Ph: [phone]       Fax: [phone]   RxID:   [id number]

## 2010-11-16 NOTE — Medication Information (Signed)
Summary: rov/tm  Anticoagulant Therapy  Managed by: Cloyde Reams, RN, BSN Referring MD: Willa Rough MD PCP: DR Donnetta Hutching MD: Ladona Ridgel MD, Sharlot Gowda Indication 1: Atrial Fibrillation (ICD-427.31) Indication 2: Cardiomyopathy (ICD-425) Lab Used: [facility] Site: [facility] INR POC 2.6 INR RANGE 2 - 2.5  Dietary changes: no    Health status changes: no    Bleeding/hemorrhagic complications: no    Recent/future hospitalizations: no    Any changes in medication regimen? no    Recent/future dental: no  Any missed doses?: no       Is patient compliant with meds? yes       Allergies: 1)  ! * Mycians  Anticoagulation Management History:      The patient is taking warfarin and comes in today for a routine follow up visit.  Positive risk factors for bleeding include an age of 35 years or older and presence of serious comorbidities.  The bleeding index is 'intermediate risk'.  Positive CHADS2 values include History of Diabetes.  Negative CHADS2 values include Age > 13 years old.  The start date was 02/15/1998.  His last INR was 3.  Anticoagulation responsible provider: Ladona Ridgel MD, Sharlot Gowda.  INR POC: 2.6.  Cuvette Lot#: [id number].  Exp: 06/2011.    Anticoagulation Management Assessment/Plan:      The patient's current anticoagulation dose is Coumadin 5 mg tabs: take as directed.  The target INR is 2 - 2.5.  The next INR is due 05/18/2010.  Anticoagulation instructions were given to patient/spouse.  Results were reviewed/authorized by Cloyde Reams, RN, BSN.  He was notified by Cloyde Reams RN.         Prior Anticoagulation Instructions: INR 1.8 Today take 5mg s then resume 5mg s everyday except 2.5mg s on Sundays, Wednesdays and Fridays. Recheck in 4 weeks.   Current Anticoagulation Instructions: INR 2.6  Continue on same dosage 5mg  daily except 2.5mg  on Sundays, Wednesdays and Fridays.  Recheck in 4 weeks.

## 2010-11-16 NOTE — Medication Information (Signed)
Summary: rov/jm  Anticoagulant Therapy  Managed by: Lyna Poser, PharmD Referring MD: Willa Rough MD PCP: DR Donnetta Hutching MD: Myrtis Ser MD,Rorie Delmore Indication 1: Atrial Fibrillation (ICD-427.31) Indication 2: Cardiomyopathy (ICD-425) Lab Used: [facility] Site: [facility] INR POC 2.3 INR RANGE 2 - 2.5  Dietary changes: no    Health status changes: no    Bleeding/hemorrhagic complications: no    Recent/future hospitalizations: no    Any changes in medication regimen? no    Recent/future dental: no  Any missed doses?: no       Is patient compliant with meds? yes       Allergies: 1)  ! * Mycians  Anticoagulation Management History:      The patient is taking warfarin and comes in today for a routine follow up visit.  Positive risk factors for bleeding include an age of 9 years or older and presence of serious comorbidities.  The bleeding index is 'intermediate risk'.  Positive CHADS2 values include History of Diabetes.  Negative CHADS2 values include Age > 75 years old.  The start date was 02/15/1998.  His last INR was 3.  Anticoagulation responsible provider: Myrtis Ser MD,Verne Lanuza.  INR POC: 2.3.  Cuvette Lot#: [id number].  Exp: 07/2011.    Anticoagulation Management Assessment/Plan:      The patient's current anticoagulation dose is Coumadin 5 mg tabs: take as directed.  The target INR is 2 - 2.5.  The next INR is due 07/28/2010.  Anticoagulation instructions were given to patient/spouse.  Results were reviewed/authorized by Lyna Poser, PharmD.         Prior Anticoagulation Instructions: INR 2.6  Continue taking one tablet every day except for one-half tablet on Sunday, Wendesday, and Friday.  Because you are going on vacation, we will recheck your INR in two weeks.  Current Anticoagulation Instructions: INR 2.3 Continue taking 1 tablet on monday, tuesday, thursday, and saturday. Take a half of a tablet on sunday, wednesday, and friday. See you in 4 weeks.

## 2010-11-16 NOTE — Medication Information (Signed)
Summary: rov/eh  Anticoagulant Therapy  Managed by: Cloyde Reams, RN Referring MD: Willa Rough MD PCP: DR Donnetta Hutching MD: Shirlee Latch MD, Mishon Blubaugh Indication 1: Atrial Fibrillation (ICD-427.31) Indication 2: Cardiomyopathy (ICD-425) Lab Used: [facility] Site: [facility] INR POC 2.2 INR RANGE 2 - 2.5  Dietary changes: no    Health status changes: no    Bleeding/hemorrhagic complications: no    Recent/future hospitalizations: no    Any changes in medication regimen? yes       Details: Taking prednisone taper, started 4 days ago, tapering off in next 3 days.    Recent/future dental: no  Any missed doses?: no       Is patient compliant with meds? yes       Allergies (verified): 1)  ! * Mycians  Anticoagulation Management History:      The patient is taking warfarin and comes in today for a routine follow up visit.  Positive risk factors for bleeding include an age of 89 years or older and presence of serious comorbidities.  The bleeding index is 'intermediate risk'.  Positive CHADS2 values include History of Diabetes.  Negative CHADS2 values include Age > 13 years old.  The start date was 02/15/1998.  His last INR was 3.  Anticoagulation responsible provider: Shirlee Latch MD, Rejeana Fadness.  INR POC: 2.2.  Cuvette Lot#: [id number].  Exp: 01/2011.    Anticoagulation Management Assessment/Plan:      The patient's current anticoagulation dose is Coumadin 5 mg tabs: take as directed.  The target INR is 2 - 2.5.  The next INR is due 12/05/2009.  Anticoagulation instructions were given to patient/spouse.  Results were reviewed/authorized by Cloyde Reams, RN.  He was notified by Cloyde Reams RN.         Prior Anticoagulation Instructions: INR 2.2  Continue same dose of 1 tablet daily except Sundays, Wednesdays, and Fridays. Patient is going out of town and will not return until 1/31. Recheck 1/31.  Current Anticoagulation Instructions: INR 2.2  Continue on same dosage 1 tablet daily  except 1/2 tablet on Sundays, Wednesdays, and Fridays.  Recheck in 3 weeks.

## 2010-11-16 NOTE — Medication Information (Signed)
Summary: rov/sp  Anticoagulant Therapy  Managed by: Bethena Midget, RN, BSN Referring MD: Willa Rough MD PCP: DR Donnetta Hutching MD: Antoine Poche MD, Fayrene Fearing Indication 1: Atrial Fibrillation (ICD-427.31) Indication 2: Cardiomyopathy (ICD-425) Lab Used: [facility] Site: [facility] INR POC 2.2 INR RANGE 2 - 2.5  Dietary changes: no    Health status changes: no    Bleeding/hemorrhagic complications: no    Recent/future hospitalizations: no    Any changes in medication regimen? no    Recent/future dental: no  Any missed doses?: no       Is patient compliant with meds? yes       Allergies: 1)  ! * Mycians  Anticoagulation Management History:      The patient is taking warfarin and comes in today for a routine follow up visit.  Positive risk factors for bleeding include an age of 47 years or older and presence of serious comorbidities.  The bleeding index is 'intermediate risk'.  Positive CHADS2 values include History of Diabetes.  Negative CHADS2 values include Age > 70 years old.  The start date was 02/15/1998.  His last INR was 3.  Anticoagulation responsible provider: Antoine Poche MD, Fayrene Fearing.  INR POC: 2.2.  Cuvette Lot#: [id number].  Exp: 05/2011.    Anticoagulation Management Assessment/Plan:      The patient's current anticoagulation dose is Coumadin 5 mg tabs: take as directed.  The target INR is 2 - 2.5.  The next INR is due 03/24/2010.  Anticoagulation instructions were given to patient/spouse.  Results were reviewed/authorized by Bethena Midget, RN, BSN.  He was notified by Bethena Midget, RN, BSN.         Prior Anticoagulation Instructions: INR 2.3  Continue same dose of 1 tablet every day except 1/2 tablet on Sunday, Wednesday and Friday   Current Anticoagulation Instructions: INR 2.2 Continue 5mg  daily except 2.5mg s on Sundays, Wednesdays and Fridays. Recheck in 4 weeks.

## 2010-11-16 NOTE — Medication Information (Signed)
Summary: rov/tm  Anticoagulant Therapy  Managed by: Louann Sjogren, PharmD Referring MD: Willa Rough MD PCP: [name], MD Supervising MD: Patty Sermons MD, Maisie Fus Indication 1: Atrial Fibrillation (ICD-427.31) Indication 2: Cardiomyopathy (ICD-425) Lab Used: [facility] Site: [facility] INR POC 2.6 INR RANGE 2 - 2.5  Dietary changes: no    Health status changes: no    Bleeding/hemorrhagic complications: no    Recent/future hospitalizations: no    Any changes in medication regimen? no    Recent/future dental: no  Any missed doses?: no       Is patient compliant with meds? yes       Allergies: 1)  ! * Mycians  Anticoagulation Management History:      The patient is taking warfarin and comes in today for a routine follow up visit.  Positive risk factors for bleeding include an age of 75 years or older and presence of serious comorbidities.  The bleeding index is 'intermediate risk'.  Positive CHADS2 values include History of Diabetes.  Negative CHADS2 values include Age > 75 years old.  The start date was 02/15/1998.  His last INR was 3 and today's INR is 2.6.  Anticoagulation responsible provider: Patty Sermons MD, Maisie Fus.  INR POC: 2.6.  Cuvette Lot#: [id number].  Exp: 11/2011.    Anticoagulation Management Assessment/Plan:      The patient's current anticoagulation dose is Coumadin 5 mg tabs: take as directed.  The target INR is 2 - 2.5.  The next INR is due 11/24/2010.  Anticoagulation instructions were given to patient/spouse.  Results were reviewed/authorized by Louann Sjogren, PharmD.         Prior Anticoagulation Instructions: INR 1.9 Today take 5mg s then Continue 5mg s daily except 2.5mg s on Sundays, Wednesdays and Fridays. Recheck in 4 weeks.   Current Anticoagulation Instructions: INR 2.6 (goal 2-3)  Continue taking 1 tablet on Mondays, Tuesdays, Thursdays, and Saturdays except take 1/2 tablet on Sundays, Wednesdays, and Fridays. Return in 4 weeks for INR  check: Friday, Feb. 10th at 11:15AM.

## 2010-11-16 NOTE — Medication Information (Signed)
Summary: rov/ewj  Anticoagulant Therapy  Managed by: Cloyde Reams, RN, BSN Referring MD: Willa Rough MD PCP: DR Donnetta Hutching MD: Clifton James MD, Cristal Deer Indication 1: Atrial Fibrillation (ICD-427.31) Indication 2: Cardiomyopathy (ICD-425) Lab Used: [facility] Site: [facility] INR POC 1.9 INR RANGE 2 - 2.5  Dietary changes: no    Health status changes: no    Bleeding/hemorrhagic complications: no    Recent/future hospitalizations: no    Any changes in medication regimen? no    Recent/future dental: no  Any missed doses?: no       Is patient compliant with meds? yes       Allergies: 1)  ! * Mycians  Anticoagulation Management History:      The patient is taking warfarin and comes in today for a routine follow up visit.  Positive risk factors for bleeding include an age of 75 years or older and presence of serious comorbidities.  The bleeding index is 'intermediate risk'.  Positive CHADS2 values include History of Diabetes.  Negative CHADS2 values include Age > 75 years old.  The start date was 02/15/1998.  His last INR was 3.  Anticoagulation responsible provider: Clifton James MD, Cristal Deer.  INR POC: 1.9.  Cuvette Lot#: [id number].  Exp: 07/2011.    Anticoagulation Management Assessment/Plan:      The patient's current anticoagulation dose is Coumadin 5 mg tabs: take as directed.  The target INR is 2 - 2.5.  The next INR is due 06/16/2010.  Anticoagulation instructions were given to patient/spouse.  Results were reviewed/authorized by Cloyde Reams, RN, BSN.  He was notified by Cloyde Reams RN.         Prior Anticoagulation Instructions: INR 2.6  Continue on same dosage 5mg  daily except 2.5mg  on Sundays, Wednesdays and Fridays.  Recheck in 4 weeks.   Current Anticoagulation Instructions: INR 1.9  Take 1 tablet tomorrow, then resume same dosage 1 tablet daily except 1/2 tablet on Sundays, Wednesdays, and Fridays.  Recheck in 4 weeks.

## 2010-11-16 NOTE — Medication Information (Signed)
Summary: rov/tm  Anticoagulant Therapy  Managed by: Bethena Midget, RN, BSN Referring MD: Willa Rough MD PCP: DR Donnetta Hutching MD: Clifton James MD, Cristal Deer Indication 1: Atrial Fibrillation (ICD-427.31) Indication 2: Cardiomyopathy (ICD-425) Lab Used: [facility] Site: [facility] INR POC 1.8 INR RANGE 2 - 2.5  Dietary changes: no    Health status changes: no    Bleeding/hemorrhagic complications: no    Recent/future hospitalizations: no    Any changes in medication regimen? yes       Details: Received 9 Rabies injections, completed series.   Recent/future dental: no  Any missed doses?: yes     Details: missed 2.5mg s less last week  Is patient compliant with meds? yes       Allergies: 1)  ! * Mycians  Anticoagulation Management History:      The patient is taking warfarin and comes in today for a routine follow up visit.  Positive risk factors for bleeding include an age of 75 years or older and presence of serious comorbidities.  The bleeding index is 'intermediate risk'.  Positive CHADS2 values include History of Diabetes.  Negative CHADS2 values include Age > 42 years old.  The start date was 02/15/1998.  His last INR was 3.  Anticoagulation responsible provider: Clifton James MD, Cristal Deer.  INR POC: 1.8.  Cuvette Lot#: [id number].  Exp: 05/2011.    Anticoagulation Management Assessment/Plan:      The patient's current anticoagulation dose is Coumadin 5 mg tabs: take as directed.  The target INR is 2 - 2.5.  The next INR is due 04/21/2010.  Anticoagulation instructions were given to patient/spouse.  Results were reviewed/authorized by Bethena Midget, RN, BSN.  He was notified by Bethena Midget, RN, BSN.         Prior Anticoagulation Instructions: INR 2.2 Continue 5mg  daily except 2.5mg s on Sundays, Wednesdays and Fridays. Recheck in 4 weeks.   Current Anticoagulation Instructions: INR 1.8 Today take 5mg s then resume 5mg s everyday except 2.5mg s on Sundays, Wednesdays and  Fridays. Recheck in 4 weeks.

## 2010-11-16 NOTE — Assessment & Plan Note (Signed)
Summary: [name] [name]   Visit Type:  Follow-up Primary Provider:  DR Sullivan Lone  CC:  atrial fibrillation.  History of Present Illness: The patient is seen today for followup of atrial fibrillation.  He also has hypertrophic obstructive cardiomyopathy.  There is mild SAM of the mitral valve but no significant outflow tract obstruction.  Last echo was done in 2009 and it does not need to be repeated at this time.  He had fallen and had a major head injury in the winter of 2004 2005.[name]  He was off Coumadin for a while but eventually be restarted unfortunately he's done well.  His atrial fib is stable clinically.  Current Medications (verified): 1)  Co Q-10 30 Mg  Caps (Coenzyme Q10) .[name].. 1 Tab Once Daily 2)  Fish Oil   Oil (Fish Oil) .[name].. 1 Tab Two Times A Day 3)  Vitamin E 600 Unit  Caps (Vitamin E) .[name].. 1 Tab Qd 4)  Grape Seed Capsule .[name].. 1 Two Times A Day 5)  Gelatin Capsule .[name].. 4 Tabs Two Times A Day 6)  Calcium 600/vitamin D 600-400 Mg-Unit Tabs (Calcium Carbonate-Vitamin D) .[name].. 1 Tab Once Daily 7)  Glucosamine Chrondrotin .... Daily 8)  Vitamin B-12 500 Mcg  Tabs (Cyanocobalamin) .[name].. 1 Tab Once Daily 9)  Otc Potassium .[name].. 1 Once Daily 10)  Vitamin C 500 Mg  Tabs (Ascorbic Acid) .[name].. 1 Tab Once Daily 11)  Multivitamins   Tabs (Multiple Vitamin) .[name].. 1 Tab Qd 12)  Simvastatin 40 Mg Tabs (Simvastatin) .... Take One-Half Tablet By Mouth Daily At Bedtime 13)  Metformin Hcl 500 Mg Tabs (Metformin Hcl) .... Take 1 Tab Once Daily 14)  Verapamil Hcl Cr 240 Mg Tbcr (Verapamil Hcl) .[name].. 1 By Mouth Two Times A Day 15)  Coumadin 5 Mg Tabs (Warfarin Sodium) .... Take As Directed 16)  Levothroid 50 Mcg Tabs (Levothyroxine Sodium) .... Once Daily 17)  Lumigan Eye Drops .Marland KitchenMarland Kitchen. 1 Drop Once Daily  Allergies (verified): 1)  ! * Mycians  Past History:  Past Medical History: Last updated: 08/25/2009   Hypertrophic obstructive cardiomyopathy. .mild SAM of the mitral valve, but no LVOT obstruction.. echo..  November, 2009  mitral regurgitation, mild...echo... November, 2009  Coumadin, longterm.  head injury when he slipped on ice in the winter of 2004     to 2005.  He stabilized, and he is back on Coumadin. 5. Hypercholesterolemia, treated. 6. Atrial fibrillation and flutter.  He has a left atrial circuit that     is not ablated.  He was on amiodarone, but we stopped it, and we     use rate control.   7. History of some liver enzymes over time. 8. History of bladder cancer, treated with BCG in the past. 9. Diabetes. 10.TSH elevation on Synthroid historically. 11.Elevated homocystine that was mild. 12.Glaucoma. 13.Gout. 14.Status post laparoscopic surgery with cryoablation of a mass     outside the kidney followed at [facility]. 15.Some atherosclerosis of the aorta by CT scan in the past.   Review of Systems       Patient denies fever, chills, headache, sweats change in vision, change in hearing, chest pain, cough, nausea vomiting, urinary symptoms.  All of the systems are reviewed and are negative.  Vital Signs:  Patient profile:   77 year old male Height:      70 inches Weight:      211 pounds BMI:     30.38 Pulse rate:   70 / minute BP sitting:  126 / 68  (left arm) Cuff size:   regular  Vitals Entered By: Hardin Negus, RMA (Feb 17, 2010 10:15 AM)  Physical Exam  General:  he is quite stable today. Eyes:  no xanthelasma. Neck:  no jugular venous distention. Lungs:  lungs are clear progress however is not labored. Heart:  cardiac exam reveals S1-S2.  There is a soft murmur.  The rhythm is irregularly irregular. Abdomen:  abdomen is soft. Extremities:  no peripheral edema. Psych:  patient is oriented to person place affect is normal.   Impression & Recommendations:  Problem # 1:  OVERWEIGHT (ICD-278.02) The patient has lost a few pounds since his last visit.  Problem # 2:  * RENAL MASS..S/P CRYOABLATION The patient's renal mass with cryoablation.  He has followup and  this has remained stable.  Problem # 3:  ATRIAL FIBRILLATION (ICD-427.31)  His updated medication list for this problem includes:    Coumadin 5 Mg Tabs (Warfarin sodium) .[name]... Take as directed The patient's atrial fib/flutter is stable.  He is coumadinized.  He has a left atrial circumflex could not be ablated.  We have had him on amiodarone in the past but this was stopped.  His rate is controlled on his current medicines.  No further workup.  Problem # 4:  HYPERLIPIDEMIA (ICD-272.4)  The following medications were removed from the medication list:    Zocor 40 Mg Tabs (Simvastatin) His updated medication list for this problem includes:    Simvastatin 40 Mg Tabs (Simvastatin) .[name]... Take one-half tablet by mouth daily at bedtime Hyperlipidemia is treated.  Patient Instructions: 1)  Your physician recommends that you schedule a follow-up appointment in: WITH DR. Kaiana Marion IN 6 MONTHS 2)  Your physician recommends that you continue on your current medications as directed. Please refer to the Current Medication list given to you today.

## 2010-11-16 NOTE — Medication Information (Signed)
Summary: rov/tm  Anticoagulant Therapy  Managed by: Bethena Midget, RN, BSN Referring MD: Willa Rough MD PCP: [name], MD Supervising MD: Daleen Squibb MD, Maisie Fus Indication 1: Atrial Fibrillation (ICD-427.31) Indication 2: Cardiomyopathy (ICD-425) Lab Used: [facility] Site: [facility] INR POC 1.9 INR RANGE 2 - 2.5  Dietary changes: no    Health status changes: no    Bleeding/hemorrhagic complications: no    Recent/future hospitalizations: no    Any changes in medication regimen? no    Recent/future dental: no  Any missed doses?: no       Is patient compliant with meds? yes       Allergies: 1)  ! * Mycians  Anticoagulation Management History:      The patient is taking warfarin and comes in today for a routine follow up visit.  Positive risk factors for bleeding include an age of 22 years or older and presence of serious comorbidities.  The bleeding index is 'intermediate risk'.  Positive CHADS2 values include History of Diabetes.  Negative CHADS2 values include Age > 17 years old.  The start date was 02/15/1998.  His last INR was 3.  Anticoagulation responsible provider: Daleen Squibb MD, Maisie Fus.  INR POC: 1.9.  Cuvette Lot#: [id number].  Exp: 11/2011.    Anticoagulation Management Assessment/Plan:      The patient's current anticoagulation dose is Coumadin 5 mg tabs: take as directed.  The target INR is 2 - 2.5.  The next INR is due 10/27/2010.  Anticoagulation instructions were given to patient/spouse.  Results were reviewed/authorized by Bethena Midget, RN, BSN.  He was notified by Bethena Midget, RN, BSN.         Prior Anticoagulation Instructions: INR 2.1 Continue 5mg s everyday except 2.5mg s on Sundays, Wednesdays,  and Fridays. Recheck in 4 weeks.   Current Anticoagulation Instructions: INR 1.9 Today take 5mg s then Continue 5mg s daily except 2.5mg s on Sundays, Wednesdays and Fridays. Recheck in 4 weeks.

## 2010-11-16 NOTE — Medication Information (Signed)
Summary: rov/eac  Anticoagulant Therapy  Managed by: Weston Brass, PharmD Referring MD: Willa Rough MD PCP: DR Donnetta Hutching MD: Graciela Husbands MD, Viviann Spare Indication 1: Atrial Fibrillation (ICD-427.31) Indication 2: Cardiomyopathy (ICD-425) Lab Used: [facility] Site: [facility] INR POC 2.3 INR RANGE 2 - 2.5  Dietary changes: no    Health status changes: no    Bleeding/hemorrhagic complications: no    Recent/future hospitalizations: no    Any changes in medication regimen? no    Recent/future dental: no  Any missed doses?: no       Is patient compliant with meds? yes       Allergies: 1)  ! * Mycians  Anticoagulation Management History:      The patient is taking warfarin and comes in today for a routine follow up visit.  Positive risk factors for bleeding include an age of 75 years or older and presence of serious comorbidities.  The bleeding index is 'intermediate risk'.  Positive CHADS2 values include History of Diabetes.  Negative CHADS2 values include Age > 32 years old.  The start date was 02/15/1998.  His last INR was 3.  Anticoagulation responsible provider: Graciela Husbands MD, Viviann Spare.  INR POC: 2.3.  Cuvette Lot#: [id number].  Exp: 02/2011.    Anticoagulation Management Assessment/Plan:      The patient's current anticoagulation dose is Coumadin 5 mg tabs: take as directed.  The target INR is 2 - 2.5.  The next INR is due 02/24/2010.  Anticoagulation instructions were given to patient/spouse.  Results were reviewed/authorized by Weston Brass, PharmD.  He was notified by Weston Brass PharmD.         Prior Anticoagulation Instructions: INR 2.5  Continue taking 1/2 tablet on Sunday, Wednesday, and Friday, and 1 tablet all other days.  Return to clinic in 4 weeks.   Current Anticoagulation Instructions: INR 2.3  Continue same dose of 1 tablet every day except 1/2 tablet on Sunday, Wednesday and Friday

## 2010-11-22 ENCOUNTER — Ambulatory Visit: Payer: Self-pay | Admitting: Ophthalmology

## 2010-11-22 NOTE — Miscellaneous (Signed)
  Clinical Lists Changes  Observations: Added new observation of PAST MED HX:   Hypertrophic obstructive cardiomyopathy. .mild SAM of the mitral valve, but no LVOT obstruction.. echo.. November, 2009  /  EF 55%... echo.[facility].. November, 2011  mitral regurgitation, mild...echo... November, 2009.[name]  Coumadin, longterm.  head injury when he slipped on ice in the winter of 2004     to 2005.  He stabilized, and he is back on Coumadin. 5. Hypercholesterolemia, treated. 6. Atrial fibrillation and flutter.  He has a left atrial circuit that     is not ablated.  He was on amiodarone, but we stopped it, and we     use rate control.   7. History of some liver enzymes over time. 8. History of bladder cancer, treated with BCG in the past. 9. Diabetes. 10.TSH elevation on Synthroid historically. 11.Elevated homocystine that was mild. 12.Glaucoma. 13.Gout. kidney mass       laparoscopic surgery with cryoablation of a mass outside the kidney followed at [facility]. atherosclerosis of the aorta by CT scan in the past.  Orthostatic hypotension      dehydration... hospitalization.... 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 [place]  hospital... November, 2011 ( This data reported to me by the patient from hospitalization.  The diagnosis was dehydration.  I do not have actual radiology reports)  (11/15/2010 16:06) Added new observation of PRIMARY MD: [name], MD (11/15/2010 16:06)       Past History:  Past Medical History:   Hypertrophic obstructive cardiomyopathy. .mild SAM of the mitral valve, but no LVOT obstruction.. echo.. November, 2009  /  EF 55%... echo.[facility].. November, 2011  mitral regurgitation, mild...echo... November, 2009.[name]  Coumadin, longterm.  head injury when he slipped on ice in the winter of 2004     to 2005.  He stabilized, and he is back on Coumadin. 5. Hypercholesterolemia, treated. 6. Atrial fibrillation and flutter.  He has a left  atrial circuit that     is not ablated.  He was on amiodarone, but we stopped it, and we     use rate control.   7. History of some liver enzymes over time. 8. History of bladder cancer, treated with BCG in the past. 9. Diabetes. 10.TSH elevation on Synthroid historically. 11.Elevated homocystine that was mild. 12.Glaucoma. 13.Gout. kidney mass       laparoscopic surgery with cryoablation of a mass outside the kidney followed at [facility]. atherosclerosis of the aorta by CT scan in the past.  Orthostatic hypotension      dehydration... hospitalization.... 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 [place]  hospital... November, 2011 ( This data reported to me by the patient from hospitalization.  The diagnosis was dehydration.  I do not have actual radiology reports)

## 2010-11-24 ENCOUNTER — Encounter (INDEPENDENT_AMBULATORY_CARE_PROVIDER_SITE_OTHER): Payer: MEDICARE

## 2010-11-24 ENCOUNTER — Encounter: Payer: Self-pay | Admitting: Cardiology

## 2010-11-24 DIAGNOSIS — I4891 Unspecified atrial fibrillation: Secondary | ICD-10-CM

## 2010-11-24 DIAGNOSIS — Z7901 Long term (current) use of anticoagulants: Secondary | ICD-10-CM

## 2010-11-30 NOTE — Medication Information (Signed)
Summary: [facility]  Anticoagulant Therapy  Managed by: Weston Brass, PharmD Referring MD: Willa Rough MD PCP: [name], MD Supervising MD: Daleen Squibb MD, Maisie Fus Indication 1: Atrial Fibrillation (ICD-427.31) Indication 2: Cardiomyopathy (ICD-425) Lab Used: [facility]  Site: [facility] INR POC 2.3 INR RANGE 2 - 2.5  Dietary changes: no    Health status changes: yes       Details: had cataract surgery 11/23/10 on right eye  Bleeding/hemorrhagic complications: no    Recent/future hospitalizations: no    Any changes in medication regimen? no    Recent/future dental: no  Any missed doses?: no       Is patient compliant with meds? yes      Comments: Patient had cataract surgery 2/8 and they stopped the metformin and fish oil for the procedure  Allergies: 1)  ! * Mycians  Anticoagulation Management History:      The patient is taking warfarin and comes in today for a routine follow up visit.  Positive risk factors for bleeding include an age of 75 years or older and presence of serious comorbidities.  The bleeding index is 'intermediate risk'.  Positive CHADS2 values include History of Diabetes.  Negative CHADS2 values include Age > 75 years old.  The start date was 02/15/1998.  His last INR was 2.6.  Anticoagulation responsible provider: Daleen Squibb MD, Maisie Fus.  INR POC: 2.3.  Cuvette Lot#: [id number].  Exp: 10/2011.    Anticoagulation Management Assessment/Plan:      The patient's current anticoagulation dose is Coumadin 5 mg tabs: take as directed.  The target INR is 2 - 2.5.  The next INR is due 12/22/2010.  Anticoagulation instructions were given to patient/spouse.  Results were reviewed/authorized by Weston Brass, PharmD.  He was notified by Margot Chimes PharmD Candidate.         Prior Anticoagulation Instructions: INR 2.6 (goal 2-3)  Continue taking 1 tablet on Mondays, Tuesdays, Thursdays, and Saturdays except take 1/2 tablet on Sundays, Wednesdays, and Fridays. Return in  4 weeks for INR check: Friday, Feb. 10th at 11:15AM.  Current Anticoagulation Instructions: INR 2.3  Continue your current dose 1 tablet everyday except on Sundays, Wednesdays, and Fridays when you take 1/2 tablet.  Recheck your INR in 4 weeks.

## 2010-12-22 ENCOUNTER — Encounter: Payer: Self-pay | Admitting: Cardiology

## 2010-12-22 ENCOUNTER — Encounter (INDEPENDENT_AMBULATORY_CARE_PROVIDER_SITE_OTHER): Payer: MEDICARE

## 2010-12-22 DIAGNOSIS — I4891 Unspecified atrial fibrillation: Secondary | ICD-10-CM

## 2010-12-22 DIAGNOSIS — I4892 Unspecified atrial flutter: Secondary | ICD-10-CM

## 2010-12-22 DIAGNOSIS — Z7901 Long term (current) use of anticoagulants: Secondary | ICD-10-CM

## 2010-12-22 LAB — CONVERTED CEMR LAB: POC INR: 2.1

## 2010-12-26 NOTE — Medication Information (Signed)
Summary: rov/tm  Anticoagulant Therapy  Managed by: Cloyde Reams, RN, BSN Referring MD: Willa Rough MD PCP: [name], MD Supervising MD: Riley Kill MD, Maisie Fus Indication 1: Atrial Fibrillation (ICD-427.31) Indication 2: Cardiomyopathy (ICD-425) Lab Used: [facility] Site: [facility] INR POC 2.1 INR RANGE 2 - 2.5  Dietary changes: no    Health status changes: no    Bleeding/hemorrhagic complications: no    Recent/future hospitalizations: no    Any changes in medication regimen? no    Recent/future dental: no  Any missed doses?: no       Is patient compliant with meds? yes       Allergies: 1)  ! * Mycians  Anticoagulation Management History:      The patient is taking warfarin and comes in today for a routine follow up visit.  Positive risk factors for bleeding include an age of 30 years or older and presence of serious comorbidities.  The bleeding index is 'intermediate risk'.  Positive CHADS2 values include History of Diabetes.  Negative CHADS2 values include Age > 45 years old.  The start date was 02/15/1998.  His last INR was 2.6.  Anticoagulation responsible provider: Riley Kill MD, Maisie Fus.  INR POC: 2.1.  Cuvette Lot#: [id number].  Exp: 12/2011.    Anticoagulation Management Assessment/Plan:      The patient's current anticoagulation dose is Coumadin 5 mg tabs: take as directed.  The target INR is 2 - 2.5.  The next INR is due 01/22/2011.  Anticoagulation instructions were given to patient/spouse.  Results were reviewed/authorized by Cloyde Reams, RN, BSN.  He was notified by Cloyde Reams RN.         Prior Anticoagulation Instructions: INR 2.3  Continue your current dose 1 tablet everyday except on Sundays, Wednesdays, and Fridays when you take 1/2 tablet.  Recheck your INR in 4 weeks.    Current Anticoagulation Instructions: INR 2.1  Continue on same dosage 1 tablet daily except 1/2 tablet on Sundays, Wednesdays, and Fridays.  Recheck in 4 weeks.

## 2010-12-28 ENCOUNTER — Ambulatory Visit (INDEPENDENT_AMBULATORY_CARE_PROVIDER_SITE_OTHER): Payer: MEDICARE | Admitting: Cardiology

## 2010-12-28 ENCOUNTER — Encounter: Payer: Self-pay | Admitting: Cardiology

## 2010-12-28 DIAGNOSIS — I4891 Unspecified atrial fibrillation: Secondary | ICD-10-CM

## 2011-01-02 NOTE — Assessment & Plan Note (Signed)
Summary: follow up/per ck out/saf/kl   Visit Type:  Follow-up Primary Provider:  [name], MD  CC:  atrial fibrillation.  History of Present Illness: The patient is seen for follow up of atrial fibrillation.  I saw him last December, 2011.  After that time I reviewed more information from [facility] and updated his past medical history.  He had an event in November, 2011 at [facility].  Most likely was from dehydration.  He has been stable since then.  His 2-D echo there showed good LV function.  He has had no recurrent symptoms.  He recently had cataract surgery.  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  This is to be followed.  Preventive Screening-Counseling & Management  Alcohol-Tobacco     Smoking Status: current  Caffeine-Diet-Exercise     Does Patient Exercise: yes      Drug Use:  no.    Current Medications (verified): 1)  Co Q-10 30 Mg  Caps (Coenzyme Q10) .[name].. 1 Tab Once Daily 2)  Fish Oil   Oil (Fish Oil) .[name].. 1 Tab Two Times A Day 3)  Vitamin E 600 Unit  Caps (Vitamin E) .[name].. 1 Tab Qd 4)  Grape Seed Capsule .[name].. 1 Two Times A Day 5)  Gelatin Capsule .[name].. 4 Tabs Two Times A Day 6)  Calcium 600/vitamin D 600-400 Mg-Unit Tabs (Calcium Carbonate-Vitamin D) .[name].. 1 Tab Once Daily 7)  Glucosamine Chrondrotin .... Two Times A Day 8)  Vitamin B-12 500 Mcg  Tabs (Cyanocobalamin) .[name].. 1 Tab Once Daily 9)  Otc Potassium .[name].. 1 Once Daily 10)  Vitamin C 500 Mg  Tabs (Ascorbic Acid) .[name].. 1 Tab Once Daily 11)  Multivitamins   Tabs (Multiple Vitamin) .[name].. 1 Tab Qd 12)  Simvastatin 40 Mg Tabs (Simvastatin) .... Take One-Half Tablet By Mouth Daily At Bedtime 13)  Metformin Hcl 500 Mg Tabs (Metformin Hcl) .... Take 1 Tab Once Daily 14)  Verapamil Hcl Cr 240 Mg Tbcr (Verapamil Hcl) .[name].. 1 By Mouth Two Times A Day 15)  Coumadin 5 Mg Tabs (Warfarin Sodium) .... Take As Directed 16)  Levothroid 50 Mcg Tabs (Levothyroxine  Sodium) .... Once Daily 17)  Lumigan Eye Drops .Marland KitchenMarland Kitchen. 1 Drop Once Daily 18)  Testosterone Shot 200mg  .... Every 4 Weeks 19)  Meclizine Hcl 25 Mg Tabs (Meclizine Hcl) .... As Needed  Allergies (verified): 1)  ! * Mycians  Past History:  Past Medical History:   Hypertrophic obstructive cardiomyopathy. .mild SAM of the mitral valve, but no LVOT obstruction.. echo.. November, 2009  /  EF 55%... echo.[facility].. November, 2011  mitral regurgitation, mild...echo... November, 2009.[name]  Coumadin, longterm.  head injury when he slipped on ice in the winter of 2004     to 2005.  He stabilized, and he is back on Coumadin. 5. Hypercholesterolemia, treated. 6. Atrial fibrillation and flutter.  He has a left atrial circuit that     is not ablated.  He was on amiodarone, but we stopped it, and we     use rate control.   7. History of some liver enzymes over time. 8. History of bladder cancer, treated with BCG in the past. 9. Diabetes. 10.TSH elevation on Synthroid historically. 11.Elevated homocystine that was mild.. 12.Glaucoma. 13.Gout. kidney mass       laparoscopic surgery with cryoablation of a mass outside the kidney followed at [facility]. atherosclerosis of the aorta by CT scan in the  past. Orthostatic hypotension      dehydration... hospitalization.... 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 [place]  hospital... November, 2011 ( This data reported to me by the patient from hospitalization.  The diagnosis was dehydration.  I do not have actual radiology reports)  Social History: Retired  Married  Tobacco Use - Yes. cigars Alcohol Use - no Regular Exercise - yes Drug Use - no Smoking Status:  current Does Patient Exercise:  yes Drug Use:  no  Review of Systems       Patient denies fever, chills, headache, sweats, rash, change in vision, change in hearing, chest pain, cough, nausea vomiting, urinary symptoms.  All of the systems are reviewed and are negative.  Vital  Signs:  Patient profile:   77 year old male Height:      70 inches Weight:      217 pounds BMI:     31.25 Pulse rate:   98 / minute BP sitting:   120 / 74  (left arm) Cuff size:   regular  Vitals Entered By: Caralee Ates CMA (December 28, 2010 8:36 AM)  Physical Exam  General:  he looks quite good today. Eyes:  no xanthelasma. Neck:  no jugular venous distention. Lungs:  lungs are clear.  Respiratory effort is nonlabored. Heart:  cardiac exam reveals an S1-S2.  Soft systolic murmur. Abdomen:  abdomen soft. Extremities:  no peripheral edema. Psych:  patient is oriented to person time and place affect is normal.   Impression & Recommendations:  Problem # 1:  * "2 OLD STROKES" NOVEMBER, 2011 This was noted by the MRI that was done at [facility].  No further workup is needed.  Patient is on Coumadin.  There was a period of time when we could not have him on Coumadin when he fell and hit his head several years ago.  It is possible that he could have had some small bits at that time.  Fortunately he has no neurologic deficits.  Problem # 2:  ORTHOSTATIC HYPOTENSION, HX OF (ICD-V12.50)  no recurrent symptoms.  No change in therapy.  Problem # 3:  ATRIAL FIBRILLATION (ICD-427.31)  Patient is on Coumadin.  Rate is controlled.  EKG is done today and reviewed by me.  Atrial fib is seen and the rate is controlled.  No change in therapy.  Orders: EKG w/ Interpretation ([id number])  Problem # 4:  * COUMADIN RX Coumadin is to be continued.  No change in therapy.  6 month followup.  Patient Instructions: 1)  Your physician wants you to follow-up in:  6 months.  You will receive a reminder letter in the mail two months in advance. If you don't receive a letter, please call our office to schedule the follow-up appointment.

## 2011-01-17 ENCOUNTER — Other Ambulatory Visit: Payer: Self-pay | Admitting: Cardiology

## 2011-01-22 ENCOUNTER — Ambulatory Visit (INDEPENDENT_AMBULATORY_CARE_PROVIDER_SITE_OTHER): Payer: Medicare Other | Admitting: *Deleted

## 2011-01-22 DIAGNOSIS — I4891 Unspecified atrial fibrillation: Secondary | ICD-10-CM

## 2011-01-22 DIAGNOSIS — I4892 Unspecified atrial flutter: Secondary | ICD-10-CM

## 2011-01-22 LAB — POCT INR: INR: 2.3

## 2011-01-23 ENCOUNTER — Other Ambulatory Visit: Payer: Self-pay | Admitting: *Deleted

## 2011-01-23 MED ORDER — VERAPAMIL HCL ER 240 MG PO TBCR: 240.0000 mg | EXTENDED_RELEASE_TABLET | Freq: Two times a day (BID) | ORAL | Status: DC

## 2011-02-23 ENCOUNTER — Ambulatory Visit (INDEPENDENT_AMBULATORY_CARE_PROVIDER_SITE_OTHER): Payer: Medicare Other | Admitting: *Deleted

## 2011-02-23 DIAGNOSIS — I4891 Unspecified atrial fibrillation: Secondary | ICD-10-CM

## 2011-02-23 DIAGNOSIS — I4892 Unspecified atrial flutter: Secondary | ICD-10-CM

## 2011-02-27 NOTE — Assessment & Plan Note (Signed)
 HEALTHCARE                            CARDIOLOGY OFFICE NOTE   NAME:Leverett, JAREMY NOSAL                    MRN:          [id number]  DATE:08/19/2008                            DOB:          1934/06/09    Mr. Zakrzewski is doing well.  He has atrial fib.  He has hypertrophic  cardiomyopathy.  Fortunately, he is doing well.  He has not had any  syncope or presyncope.  After careful review, I see that his last echo  was in 2005.  I have rechecked the computer and he has not had any in  the [facility] systems since then.  It is time to repeat his echo.  He is  going about full activities.  He is doing water aerobics.  He does not  have chest pain or shortness of breath.  He does not feel as  palpitations.   PAST MEDICAL HISTORY:   ALLERGIES:  MYCIN, ANTIBIOTICS.   MEDICATIONS:  See the flow sheet.   REVIEW OF SYSTEMS:  He is not having any GI or GU symptoms.  He has no  headaches, fevers.  Otherwise, his review of systems is negative.   PHYSICAL EXAMINATION:  VITAL SIGNS:  Blood pressure is 118/74.  His  weight is up a few pounds to 212 and we talked about weight loss.  Heart  rate is 86.  GENERAL:  The patient is oriented to person, time and place.  Affect is  normal.  He is here with his wife today.  HEENT:  No xanthelasma.  He has normal extraocular motion.  NECK:  There are no carotid bruits.  There is no jugular venous  distention.  LUNGS:  Clear.  Respiratory effort is not labored.  CARDIAC:  An S1 with an S2.  There is a soft systolic murmur.  ABDOMEN:  Soft.  EXTREMITIES:  He has no significant peripheral edema.   EKG reveals atrial fib with a controlled ventricular response.   Problems include those listed on my note of August 20, 2007.  Hypertrophic obstructive cardiomyopathy.  It is now time for follow-up 2-  D echo and this will be arranged.  1. Atrial fib and flutter.  His left atrial circuit cannot be ablated.      We eventually  stopped his amiodarone.  He has rate control and he      is doing very well.  We will obtain a 2-D echo and be in touch with      him.  Otherwise, I will see him in 6 months.    Luis Abed, MD, [facility]  Electronically Signed   JDK/MedQ  DD: 08/19/2008  DT: 08/19/2008  Job #: [id number]   cc:   [name]

## 2011-02-27 NOTE — Assessment & Plan Note (Signed)
[facility]                            CARDIOLOGY OFFICE NOTE   NAME:[name] [name], [name] [name]                    MRN:          [id number]  DATE:08/20/2007                            DOB:          11-Jan-1934    [name] is doing very well.  He has traveled to [place], and he  has done well.  He has had no syncope or presyncope.  He does note that  if he has had some extra salt, he may get some swelling during the day.  This comes down at nighttime without the use of any diuretic.  I talked  to him about watching his salt.  We do not want to put him on a diuretic  unless it is absolutely necessary because of his hypertrophic  obstructive cardiomyopathy.  He has atrial fibrillation that is now  chronic, and he is on Coumadin, and stable.   PAST MEDICAL HISTORY:   ALLERGIES:  MYCIN ANTIBIOTICS.   MEDICATIONS:  Zocor, eye drops, calcium, metformin, verapamil, Coumadin,  thyroid, AndroGel, fish oil, and other supplements.   OTHER MEDICAL PROBLEMS:  See the list below.   REVIEW OF SYSTEMS:  He is feeling well, and having no significant  complaints.   PHYSICAL EXAMINATION:  Weight is 206 pounds.  Blood pressure is 126/82.  His pulse is 95 today.  Patient is oriented to person, time, and place.  Affect is normal.  He  is here with his wife today.  HEENT:  Reveals no xanthelasma.  He has normal extraocular motion.  There are no carotid bruits.  There is no jugular venous distention.  LUNGS:  Clear.  Respiratory effort is not labored.  CARDIAC:  Exam reveals an S1 with an S2.  There are no clicks or  significant murmurs.  The rhythm is irregularly irregular.  ABDOMEN:  Soft.  There are no masses or bruits.  He has mild peripheral edema today.   His lipids are reviewed.  EKG reveals atrial fibrillation.  His LDL is  68.  HDL is up to 36.  Triglycerides are mildly elevated, but better  than before at 210.  Total cholesterol is 148.   IMPRESSION:   Problems include:  1. Hypertrophic obstructive cardiomyopathy.  He has some anterior      motion of the mitral valve, but no significant outflow tract      obstruction.  We talked again about being careful with his salt and      fluid intake, because I do not want him to have edema.  However, we      need to be careful not to diurese him too far, and he is not on a      diuretic.  2. History of mitral regurgitation, mild.  3. History of Coumadin, longterm.  4. History of head injury when he slipped on ice in the winter of 2004      to 2005.  He stabilized, and he is back on Coumadin.  5. Hypercholesterolemia, treated.  6. Atrial fibrillation and flutter.  He has a left atrial circuit that      is  not ablated.  He was on amiodarone, but we stopped it, and we      use rate control.  He is stable.  7. History of some liver enzymes over time.  8. History of bladder cancer, treated with BCG in the past.  9. Diabetes.  10.TSH elevation on Synthroid historically.  11.Elevated homocystine that was mild.  12.Glaucoma.  13.Gout.  14.Status post laparoscopic surgery with cryoablation of a mass      outside the kidney followed at [facility].  15.Some atherosclerosis of the aorta by CT scan in the past.   Overall cardiac status is stable.  We will not change his medications.  I will see him back in 6 months for followup.     [name] Abed, MD, [facility]  Electronically Signed    JDK/MedQ  DD: 08/20/2007  DT: 08/20/2007  Job #: [id number]   cc:   [name] [name]

## 2011-02-27 NOTE — Assessment & Plan Note (Signed)
[facility]                            CARDIOLOGY OFFICE NOTE   NAME:[name] [name] KUBITZ                    MRN:          [id number]  DATE:03/06/2007                            DOB:          05/30/1934    [name] [name] is doing very well.  He has been away on a nice  vacation.  He has gained 5-6 pounds since he was here last, he is aware  of this, and he is beginning to lose some weight.  He has not had any  chest pain.  He has had no syncope or presyncope.  He has been active.  He does admit that he has forgotten to take his Verapamil on some  occasions and I encouraged him to try to get 2 doses in each day if  possible.  In fact he may have forgotten to take it this morning and his  resting rate is higher than I would like.  In general however we have  had good rate control with his current medications.  He has hypertrophic  obstructive cardiomyopathy and MR and atrial fibrillation.   PAST MEDICAL HISTORY:  ALLERGIES:  MYCIN ANTIBIOTICS.   MEDICATIONS:  1. Zocor 20.  2. Eye drops.  3. Vitamins.  4. Metformin.  5. Verapamil 240 b.i.d.  6. Coumadin.  7. Thyroid.  8. AndroGel.  9. Fish oil.   OTHER MEDICAL PROBLEMS:  See the list on my note of May 15, 2006.   REVIEW OF SYSTEMS:  He has no complaints.   His review of systems otherwise is negative.   PHYSICAL EXAMINATION:  Weight is 209 pounds, blood pressure 140/88 with  a pulse of 90 (no Verapamil this morning).  The patient is oriented to  person, time, and place and his affect is normal.  HEENT:  Reveals no xanthelasma.  He has normal extraocular motion.  There are no carotid bruits.  There is no jugular venous distention.  LUNGS:  Clear.  Respiratory effort is not labored.  CARDIAC:  Exam reveals that his rhythm is irregularly irregular.  There  are no significant murmurs.  ABDOMEN:  Soft.  He has no peripheral edema.  There are no musculoskeletal deformities.   EKG reveals atrial  fibrillation and his rate is faster than I would like  as he did not take his morning Verapamil.   Problems are listed on my note of May 15, 2006.  He is stable.  He  fortunately has been able to tolerate his Coumadin after it was  restarted after he had a bleed when falling on the ice in the past.  He  is not having symptoms from his heart rate.  He is stable.  He needs  fasting lipid profile and I will see him back in 6 months.     Luis Abed, MD, [facility]  Electronically Signed    JDK/MedQ  DD: 03/06/2007  DT: 03/06/2007  Job #: [id number]   cc:   [name]

## 2011-02-27 NOTE — Assessment & Plan Note (Signed)
[facility]                            CARDIOLOGY OFFICE NOTE   NAME:[name], [name] HUNLEY                    MRN:          [id number]  DATE:02/16/2008                            DOB:          08/13/1934    [name] [name] is doing very well.  He has no symptoms from his atrial  fibrillation.  He has no symptoms from his hypertrophic cardiomyopathy.  He is doing well.  He has had no syncope or presyncope.  He is  tolerating his Coumadin.   PAST MEDICAL HISTORY:  ALLERGIES:  MYCIN ANTIBIOTICS.   MEDICATIONS:  1. Coenzyme Q.  2. Fish oil.  3. Vitamin E.  4. Grape seed capsule gelatin.  5. Glucosamine chondroitin.  6. Zocor.  7. Eye drops.  8. Calcium.  9. Metformin.  10.Verapamil 240 b.i.d.  11.Coumadin.  12.Levothroid.  13.AndroGel.   OTHER MEDICAL PROBLEMS:  See the complete list on the note of August 20, 2007.   REVIEW OF SYSTEMS:  He looks and feels fine and has no significant  problems.   PHYSICAL EXAMINATION:  Blood pressure is 118/74 with a pulse of 95.  The  patient is oriented to person, time and place.  Affect is normal.  He is  here with his wife today.  HEENT:  Reveals no xanthelasma.  He has normal extraocular motion.  There are no carotid bruits.  There is no jugular venous distention.  LUNGS:  Are clear.  Respiratory effort is not labored.  CARDIAC EXAM:  Reveals S1-S2.  There are no clicks or significant  murmurs heard today.  The abdomen is soft.  There is no peripheral edema.   EKG reveals atrial fibrillation.  His rate is reasonably controlled.  He  has no symptoms from his rate.  I have chosen not to adjust his  medicines further.   Problems are listed on the note of August 20, 2007.  He is stable.  No  change in his therapy.  His atrial fibrillation and flutter are under  control.  There is no sign of heart failure.  Six-month follow-up.     Luis Abed, MD, [facility]  Electronically Signed    JDK/MedQ   DD: 02/16/2008  DT: 02/16/2008  Job #: [id number]   cc:   [name]

## 2011-03-02 NOTE — Consult Note (Signed)
NAME:  [name] [name], [name] [name]                       ACCOUNT NO.:  [id number]   MEDICAL RECORD NO.:  [id number]                   PATIENT TYPE:  INP   LOCATION:  3111                                 FACILITY:  [facility]   PHYSICIAN:  Hubbard Hartshorn, M.D. [facility]              DATE OF BIRTH:  1934-08-14   DATE OF CONSULTATION:  11/14/2002  DATE OF DISCHARGE:                                   CONSULTATION   HISTORY OF PRESENT ILLNESS:  The patient is a 77 year old white male with a  history of Holcome, hypertension, and atrial fibrillation for four years,  who was initially treated with amiodarone, but had a recurrent in November  of 2003 and had cardioversion with initial success, but then returned to  atrial fibrillation, so he was placed on verapamil for rate control and  Coumadin.  He was walking his dog today when he slipped on some ice and hit  the back of his head.  He did not have any loss of consciousness, but was  found to have a subarachnoid hemorrhage on CT scan at an outside hospital  with a small occipital fracture as well.  His INR was 1.9.  He was given  vitamin K.  We have been asked to consult for his Coumadin management.  He  has no other cardiac history.   PAST MEDICAL HISTORY:  Significant for:  1. Holcome.  2. Atrial fibrillation for four years.  3. Diabetes.  4. Hypertension.  5. Asthma.  6. GERD.  7. Glaucoma.  8. Bladder cancer.   ALLERGIES:  MYCIN ANTIBIOTIC GROUP.   FAMILY HISTORY:  His father had cardiomyopathy and he had two aunts with  cardiomyopathy of unknown etiology.   SOCIAL HISTORY:  He is married with three children.  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.  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  He quit tobacco in 1995.  He denies any alcohol  use.   REVIEW OF SYSTEMS:  Positive for feet numbness for the past 15 years.   PHYSICAL EXAMINATION:  VITAL SIGNS:  Temperature 98.3 degrees, blood  pressure 110/60,  heart rate 80, respiratory rate 16.  GENERAL APPEARANCE:  No acute distress.  He is lying flat.  NECK:  There is no JVP or hepatojugular reflux.  HEENT:  He does have positive raccoon eyes (hematoma around both orbits).  LUNGS:  Clear to auscultation anterolaterally.  CARDIAC:  His PMI was not palpable.  S1 and S2 irregularly irregular with a  3/6 systolic ejection murmur at the left upper sternal border radiating to  the apex.  ABDOMEN:  Soft, nontender, and nondistended.  No hepatosplenomegaly.  EXTREMITIES:  There is no cyanosis, clubbing, or edema.   LABORATORY DATA:  The EKG showed atrial fibrillation with a rate of 90, an  intraventricular conduction delay, and nondiagnostic ST changes.   ASSESSMENT AND PLAN:  A 77 year old white male with atrial  fibrillation,  Holcome, and hypertension on Coumadin status post a fall with occipital  fracture and subarachnoid hemorrhage.  He was given vitamin K.  The plan by  neurosurgery is to observe for any progression of his intracranial bleed.  I  agree with vitamin K and holding his Coumadin.  The patient is at fairly low  risk for stroke in the short term off Coumadin.  Will discuss with  neurosurgery when they feel safe that he can restart this.  There is no  evidence of that he had a syncopal episode and there is no reason to think  that he is at further risk of falling.                                               Hubbard Hartshorn, M.D. [facility]    JH/MEDQ  D:  11/14/2002  T:  11/14/2002  Job:  [id number]

## 2011-03-02 NOTE — Assessment & Plan Note (Signed)
[facility]                              CARDIOLOGY OFFICE NOTE   NAME:Mckim, KEN BONN                    MRN:          [id number]  DATE:05/15/2006                            DOB:          01/01/1934    HISTORY OF PRESENT ILLNESS:  Mr. Swart is doing great.  He has a good  report from his urology team at [facility].  He is not having any chest pain or  shortness of breath.  He has had no presyncope or syncope.  He had one  episode of dizziness after sitting for 30 minutes and standing rapidly.  I  doubt this is clinically significant.   ALLERGIES:  MYCIN ANTIBIOTIC GROUP.   MEDICATIONS:  1.  Vitamins.  2.  Zocor.  3.  Eye drops.  4.  Metformin.  5.  Verapamil 240 mg b.i.d.  6.  Coumadin as directed.  7.  Levothroid.  8.  AndroGel.  9.  Fish oil.   OTHER MEDICAL PROBLEMS:  See the complete list below.   REVIEW OF SYSTEMS:  He feels fine.  He is having some sweating in the  evening, and he has had this in the past and no workup is need.  Otherwise,  his review of systems is negative.   PHYSICAL EXAMINATION:  VITAL SIGNS:  Blood pressure 118/72, pulse 85.  The  patient is oriented to person, place and time and his affect is normal.  LUNGS:  Clear.  Respiratory effort is not labored.  HEENT:  No xanthelasma.  He has normal extraocular motion.  He has no  carotid bruits.  There is no jugular venous distention.  CARDIAC:  Soft, systolic murmur.  ABDOMEN:  Soft.  No significant peripheral edema.  He does have an unusual  skin sensation which is fullness in the mid abdomen.  I do not believe this  is clinically significant.   PROBLEMS:  1.  Hypertrophic obstructive cardiomyopathy with some anterior motion of the      mitral valve but no significant outflow tract obstruction.  We have to      be sure that he does not become significantly dehydrated.  2.  History of mild MR.  3.  Need for long-term Coumadin.  4.  Status post head injury  while slipping on ice in the winter of 2004 and      2005.  He is stabilized and back on Coumadin.  5.  Hypercholesterolemia.  6.  Atrial fibrillation and flutter.  He has a left atrial circuit that has      not been ablated.  He was on Amiodarone at one point, but we stopped it,      and now use rate control.  7.  History of some liver enzyme increase over time.  8.  History of bladder cancer treated with BCG in the past.  9.  Diabetes.  10. TSH elevation on Synthroid.  11. Elevated homocysteine in the past that was mild.  12. Glaucoma.  13. Gout.  14. Status post laparoscopic surgery with Cryoablation of a mass outside his  kidneys, and this is followed at [facility], and he has been told that he is      disease free of one year.  15. Some atherosclerosis of the aorta by CT scan.   He is stable, and there is no need for any changes or studies.  I will see  him back in nine months.                               Luis Abed, MD, [facility]    JDK/MedQ  DD:  05/15/2006  DT:  05/15/2006  Job #:  [id number]   cc:   Jule Ser Polascik

## 2011-03-02 NOTE — H&P (Signed)
NAME:  [name] [name], [name] [name]                       ACCOUNT NO.:  [id number]   MEDICAL RECORD NO.:  [id number]                   PATIENT TYPE:  INP   LOCATION:  3111                                 FACILITY:  [facility]   PHYSICIAN:  Danae Orleans. Venetia Maxon, M.D.               DATE OF BIRTH:  11/20/1933   DATE OF ADMISSION:  11/14/2002  DATE OF DISCHARGE:                                HISTORY & PHYSICAL   REASON FOR ADMISSION:  Intracerebral hemorrhage following traumatic head  injury.   HISTORY OF PRESENT ILLNESS:  The patient is a 77 year old man who is on  Coumadin for atrial fibrillation with cardiomyopathy, who fell upon the ice  and struck his right occiput today.  He denied loss of consciousness.  He  did complain of dizziness.  He went to [facility], where head CT was  obtained which demonstrates a small occipital ______ skull fracture and  small right on the right and a small left parietal subarachnoid hemorrhage  with bifrontal confusions, left greater than right.  His INR at that time  was 1.9.  He was given Vitamin K 10 mg IM and transferred to [facility].  In addition the patient has a history of type 2 diabetes.  Dr.  Myrtis Ser and Dr. Ladona Ridgel are his cardiologists.   PAST MEDICAL HISTORY:  1. Significant for atrial fibrillation.  2. Cardiomyopathy.  3. Diabetes.  4. Glaucoma.  5. Asthma.  6. Gastroesophageal reflux disease.   CURRENT MEDICATIONS:  1. Verapamil SR 240 mg 1-1/2 p.o. daily.  2. Zocor 20 mg p.o. daily.  3. Coumadin 2.5 mg 1-1/2 Monday, Wednesday, Friday.  4. Bumetanide 0.5 mg daily.  5. AndroGel 5 mg daily.  6. Lumigan one drop each eye daily.  7. Metformin.  8. Hydrochlorothiazide 500 mg daily.  9. Co-Q 10, 50 mg daily.  10.      Vitamin C 500 mg daily.  11.      Vitamin E 400 international units daily.  12.      Fish oil.  13.      Calcium D.  14.      Vitamin D.  15.      Multivitamin.  16.      Gelatin capsules.   ALLERGIES:  1. IVP  DYE.  2. MYCINS.   PHYSICAL EXAMINATION:  VITAL SIGNS:  Temperature is 98.9, pulse is 98,  respiratory rate 14, blood pressure 133/64.  CBG is 199.  GENERAL:  The patient is awake, alert, and conversant.  He complains of  dizziness with extremes of vision.  HEENT:  Pupils are equal, round, and reactive to light.  Extraocular  movements are intact.  He has some ecchymosis of his right periorbital  lesion.  He face is symmetric, both in sensation and motor function.  He has  tenderness over the right side of the occiput.  He has no pronator drift.  He  is full power in both upper and lower extremities bilaterally.  Reflexes  are symmetric, diminished in the upper and lower extremities, 1 at the  knees, absent in the ankles.  Great toes downgoing to plantar stimulation.  CHEST:  Clear to auscultation.  HEART:  Regular rate and rhythm with systolic ejection murmur appreciated.  ABDOMEN:  Soft, obese, nontender.  EXTREMITIES:  Without edema, clubbing, or cyanosis.   IMPRESSION:  The patient is a 78 year old man with status post fall on  Coumadin with small intraventricular hemorrhage.  He is to be observed in  the intensive care unit.  Vitamin K for three days.  No fresh frozen plasma  unless hematoma enlarges.  No Dilantin unless seizure.  Cardiology was  notified that the patient was admitted.                                                  Danae Orleans. Venetia Maxon, M.D.    JDS/MEDQ  D:  11/14/2002  T:  11/14/2002  Job:  [id number]

## 2011-03-05 ENCOUNTER — Ambulatory Visit (INDEPENDENT_AMBULATORY_CARE_PROVIDER_SITE_OTHER): Payer: Medicare Other | Admitting: *Deleted

## 2011-03-05 DIAGNOSIS — I4892 Unspecified atrial flutter: Secondary | ICD-10-CM

## 2011-03-05 DIAGNOSIS — I4891 Unspecified atrial fibrillation: Secondary | ICD-10-CM

## 2011-03-05 NOTE — Patient Instructions (Signed)
Please call Coumadin Clinic with any problems. If will be gone more than 4 weeks, please contact clinic for order to have PT/INR draw in [place].

## 2011-04-13 ENCOUNTER — Ambulatory Visit (INDEPENDENT_AMBULATORY_CARE_PROVIDER_SITE_OTHER): Payer: Medicare Other | Admitting: *Deleted

## 2011-04-13 DIAGNOSIS — I4892 Unspecified atrial flutter: Secondary | ICD-10-CM

## 2011-04-13 DIAGNOSIS — I4891 Unspecified atrial fibrillation: Secondary | ICD-10-CM

## 2011-04-13 LAB — POCT INR: INR: 1.6

## 2011-04-20 ENCOUNTER — Encounter: Payer: Self-pay | Admitting: Cardiology

## 2011-04-27 ENCOUNTER — Encounter: Payer: Self-pay | Admitting: Cardiology

## 2011-04-30 ENCOUNTER — Ambulatory Visit: Payer: Self-pay | Admitting: Family Medicine

## 2011-04-30 ENCOUNTER — Encounter: Payer: Medicare Other | Admitting: *Deleted

## 2011-05-01 ENCOUNTER — Ambulatory Visit (INDEPENDENT_AMBULATORY_CARE_PROVIDER_SITE_OTHER): Payer: Self-pay | Admitting: Cardiovascular Disease

## 2011-05-01 DIAGNOSIS — R0989 Other specified symptoms and signs involving the circulatory and respiratory systems: Secondary | ICD-10-CM

## 2011-05-16 ENCOUNTER — Other Ambulatory Visit: Payer: Self-pay | Admitting: Pharmacist

## 2011-05-16 ENCOUNTER — Ambulatory Visit (INDEPENDENT_AMBULATORY_CARE_PROVIDER_SITE_OTHER): Payer: Medicare Other | Admitting: *Deleted

## 2011-05-16 DIAGNOSIS — I4891 Unspecified atrial fibrillation: Secondary | ICD-10-CM

## 2011-05-16 DIAGNOSIS — I4892 Unspecified atrial flutter: Secondary | ICD-10-CM

## 2011-05-16 MED ORDER — WARFARIN SODIUM 5 MG PO TABS: ORAL_TABLET | ORAL | Status: DC

## 2011-06-05 ENCOUNTER — Ambulatory Visit: Payer: Self-pay | Admitting: Otolaryngology

## 2011-06-13 ENCOUNTER — Encounter: Payer: Medicare Other | Admitting: *Deleted

## 2011-06-15 ENCOUNTER — Telehealth: Payer: Self-pay | Admitting: Cardiology

## 2011-06-15 ENCOUNTER — Ambulatory Visit (INDEPENDENT_AMBULATORY_CARE_PROVIDER_SITE_OTHER): Payer: Medicare Other | Admitting: Cardiology

## 2011-06-15 ENCOUNTER — Encounter: Payer: Self-pay | Admitting: Cardiology

## 2011-06-15 ENCOUNTER — Ambulatory Visit (INDEPENDENT_AMBULATORY_CARE_PROVIDER_SITE_OTHER): Payer: Medicare Other | Admitting: *Deleted

## 2011-06-15 VITALS — BP 122/76 | HR 92 | Ht 69.0 in | Wt 221.0 lb

## 2011-06-15 DIAGNOSIS — I951 Orthostatic hypotension: Secondary | ICD-10-CM

## 2011-06-15 DIAGNOSIS — I4891 Unspecified atrial fibrillation: Secondary | ICD-10-CM

## 2011-06-15 DIAGNOSIS — R2681 Unsteadiness on feet: Secondary | ICD-10-CM | POA: Insufficient documentation

## 2011-06-15 DIAGNOSIS — R269 Unspecified abnormalities of gait and mobility: Secondary | ICD-10-CM

## 2011-06-15 DIAGNOSIS — R61 Generalized hyperhidrosis: Secondary | ICD-10-CM | POA: Insufficient documentation

## 2011-06-15 DIAGNOSIS — E119 Type 2 diabetes mellitus without complications: Secondary | ICD-10-CM

## 2011-06-15 DIAGNOSIS — G4730 Sleep apnea, unspecified: Secondary | ICD-10-CM | POA: Insufficient documentation

## 2011-06-15 DIAGNOSIS — R110 Nausea: Secondary | ICD-10-CM | POA: Insufficient documentation

## 2011-06-15 DIAGNOSIS — I4892 Unspecified atrial flutter: Secondary | ICD-10-CM

## 2011-06-15 DIAGNOSIS — Z8679 Personal history of other diseases of the circulatory system: Secondary | ICD-10-CM

## 2011-06-15 LAB — POCT INR: INR: 2.2

## 2011-06-15 MED ORDER — FLUDROCORTISONE ACETATE 0.1 MG PO TABS: 0.1000 mg | ORAL_TABLET | Freq: Every day | ORAL | Status: DC

## 2011-06-15 MED ORDER — DIGOXIN 125 MCG PO TABS: 125.0000 ug | ORAL_TABLET | Freq: Every day | ORAL | Status: DC

## 2011-06-15 NOTE — Assessment & Plan Note (Signed)
There is no evidence that the patient's symptoms are related to hypoglycemia.

## 2011-06-15 NOTE — Assessment & Plan Note (Signed)
The patient's atrial fibrillation rate may be slightly increased.  He is on verapamil for rate control.  It is possible that this is causing his blood pressure to be somewhat lower than we want.  I decided to lower his verapamil dose and add digoxin for rate control.

## 2011-06-15 NOTE — Telephone Encounter (Signed)
Dr. Myrtis Ser MD aware, pt  will be seen in the office as soon as he gets in, per Dr. Myrtis Ser. Patient aware.

## 2011-06-15 NOTE — Assessment & Plan Note (Signed)
The patient has a new diagnosis of significant sleep apnea.  It is possible that some of his symptoms may improve when he has CPAP.  He noted that he felt great after he slept CPAP on one occasion.

## 2011-06-15 NOTE — Assessment & Plan Note (Signed)
Very careful orthostatic blood pressure check was done today.  There was no evidence of significant orthostasis today.  When he went from lying to sitting there was slight decrease in the systolic pressure.  However when he began to stand his pressure returned to a lying pressure.  It remained that way for 2 minutes and 5 minutes.  When his pressure went down slightly from lying to sitting there was very slight dizziness.  This symptom is not a symptom that he gets of "unsteady gait."  I feel that his unsteady gait is not from orthostasis.  However we have continued to be sure that he remains hydrated so that he doesn't have orthostatic dizziness.  He is not on a diuretic.  He has never had evidence of congestive heart failure.  I will start Florinef 0.1 mg daily.  This is a very small dose.  If he develops evidence of fluid overload, he will stop this.

## 2011-06-15 NOTE — Assessment & Plan Note (Signed)
The etiology of his unsteady gait is unclear to me.  This is a new problem for my evaluation.  We know that he has had some neurologic problems in the past.  He had a significant head injury in the past when he fell on the ice.  He was also noted with his last hospitalization in [facility] for that he had "2 old strokes noted on CT scan and MRI.  I wonder if the gait problem could be neurologic .  I will look into the possibility of full neurology evaluation.

## 2011-06-15 NOTE — Telephone Encounter (Signed)
Spoke with patient's wife, she states in January 2012 pt was admitted to the hospital with dehydration. Two months ago started  having problems with his balance, some  dizziness. Dr. Sullivan Lone ENT did an EKG which it was normal. Two  Weeks ago  ENT did a Cristal test which it was normal. A sleep apnea test also was done, results were given to patient per ENT yesterday in which pt said he stop breathing 13 time in one hour and saturation was 70 %. A C-pap mask  was recommended. This morning patient C/O of nauseas some cold sweats ( not now) and off balance. Patient denies SOB or chest pain. Patient wants to be seen today.

## 2011-06-15 NOTE — Assessment & Plan Note (Signed)
I cannot explain the patient's increased sweating.  I believe that this is not a cardiovascular issue.  This will be followed.

## 2011-06-15 NOTE — Telephone Encounter (Signed)
Dr Shirlee Latch had nurse all re this pt, they called to [place], dr Shirlee Latch was triage to call asap, someone may need to see pt today, dr Shirlee Latch full in [place]

## 2011-06-15 NOTE — Assessment & Plan Note (Signed)
The etiology of the patient's nausea this morning is not clear.  It is not a regular part of his symptomatology.  He does not become nauseated with his slight dizziness if there is any orthostasis.  He does not become dizzy when he has gait problems.  No further evaluation today.  This is a new complaint today to me

## 2011-06-15 NOTE — Telephone Encounter (Signed)
Patient wife calling C/O dizzy , nausea, balance been off. Sweaty.

## 2011-06-15 NOTE — Patient Instructions (Addendum)
Decrease Verapamil HCL  to 240mg  daily.  Start Florinef 0.1mg  daily.  Start Lanoxin(digoxin) --take TWO  0.125mg  tablets every 6 hours for 4 DOSES, then decrease to one 0.125mg  tablet  daily.  Keep the appointment with Dr Myrtis Ser Friday September 21,2012 at 11:15am  Dr Myrtis Ser has referred you to Dr Avie Echevaria at [facility].

## 2011-06-15 NOTE — Progress Notes (Signed)
HPI The patient's wife called today to explain that the patient was having continued problems with sweating, nausea, unsteady gait.  He was added onto my schedule today.  As documented in the chart there is a long-standing history of atrial fibrillation.  His rate has been relatively well controlled.  He does have hypertrophic cardiomyopathy with mild SAM of the mitral valve but he has never had a left ventricular outflow tract gradient.  He recently had a very nice ENT evaluation to be sure that he was not having significant ENT problems as the basis of his unsteady gait.  At times the symptom has been described as dizziness but the patient does not feel dizzy.  He can be up walking and then began to have unsteady gait.  There is not a true presyncope.We know that there has been a component of orthostasis.  Recently when this was documented in January, 2012 decision was made to be sure that he stayed well hydrated and this has been done.  It is also important to note that commonly his symptoms do not occur when he first stands up but rather gait  instability when walking That occurs randomly.  The patient also has a new diagnosis of obstructive sleep apnea.  His sleep study showed marked hypoxia and he will be getting CPAP very soon.  Some of his complex of symptoms may be related to this.  In addition the patient had some nausea this morning.  Etiology is not clear.  He went back to sleep and woke up feeling much improved.  This occurred after her night time with very poor sleep.  Once again his sleep apnea may be playing a role here.  In general the patient has noted that over time he has some night sweats.  This is not a new finding and there's been no evidence of fever with this.  He now has this sweating at times during the day.  He does not become clammy with nor does it make him feel ill.  As part of this evaluation today I have reviewed the patient's old records.  I saw him last in the office on  December 28, 2010.  He has a complex list of medical problems.  I have completely updated the new EMR.  In addition I have reviewed records that I have received from his ENT physician in [place].  As part of his evaluation today I have spent greater than one hour with the chart and the patient Allergies  Allergen Reactions  . Mycinettes     Current Outpatient Prescriptions  Medication Sig Dispense Refill  . Bimatoprost (LUMIGAN OP) Apply 1 drop to eye daily.        . Calcium-Vitamin D 600-200 MG-UNIT per tablet Take 1 tablet by mouth daily.        [name] co-enzyme Q-10 30 MG capsule Take 30 mg by mouth daily.        . fish oil-omega-3 fatty acids 1000 MG capsule Take 2 g by mouth 2 (two) times daily.        . Gelatin Capsules, Empty, CAPS 4 capsules by Does not apply route 2 (two) times daily.        . Glucosamine 500 MG CAPS Take 1 capsule by mouth daily.        [name] GRAPE SEED CR PO Take 1 capsule by mouth 2 (two) times daily.        [name] levothyroxine (SYNTHROID, LEVOTHROID) 50 MCG tablet Take 50 mcg by  mouth daily.        . metFORMIN (GLUCOPHAGE) 500 MG tablet Take 500 mg by mouth daily.        . Multiple Vitamin (MULTIVITAMIN) tablet Take 1 tablet by mouth daily.        [name] POTASSIUM PO Take 1 tablet by mouth daily.        . simvastatin (ZOCOR) 40 MG tablet Take 20 mg by mouth at bedtime.        [name] testosterone cypionate (DEPOTESTOTERONE CYPIONATE) 200 MG/ML injection Inject 200 mg into the muscle every 28 (twenty-eight) days.        . verapamil (CALAN-SR) 240 MG CR tablet Take 1 tablet (240 mg total) by mouth 2 (two) times daily.  180 tablet  3  . vitamin B-12 (CYANOCOBALAMIN) 500 MCG tablet Take 500 mcg by mouth daily.        . vitamin C (ASCORBIC ACID) 500 MG tablet Take 500 mg by mouth daily.        . vitamin E 600 UNIT capsule Take 600 Units by mouth daily.        [name] warfarin (COUMADIN) 5 MG tablet Take as directed by Anticoagulation clinic   90 tablet  0    History   Social History  .  Marital Status: Married    Spouse Name: N/A    Number of Children: N/A  . Years of Education: N/A   Occupational History  . Not on file.   Social History Main Topics  . Smoking status: Current Everyday Smoker    Types: Cigars  . Smokeless tobacco: Not on file  . Alcohol Use: No  . Drug Use: No  . Sexually Active: Not on file   Other Topics Concern  . Not on file   Social History Narrative   Married, retired, gets regular exercise.     No family history on file.  Past Medical History  Diagnosis Date  . Hypertrophic obstructive cardiomyopathy     mild SAM of the mitral valve, but no LVOT obstrcution. echo 11/09 EF 55%. [facility] -cho-11/11. mild regurgitation - echo - ``/09  . Head injury     when slipped on ice 2004-2005. stablaized and back on Coumadin   . Hypercholesterolemia     treated.   . Atrial fibrillation     and aflutter. pt has a left atrial circuit that is not ablated. was on amiodarone-stopped, now use rate control.   . Elevated liver enzymes     over time; hx  . Bladder cancer     hx; treated with BCG in past   . Diabetes mellitus     type not specified  . TSH elevation     on synthroid historically   . Homocystinemia     elevated, mild   . Glaucoma   . Gout   . Kidney mass     laproscopic surgery woth cryoablation of a mass outside kidney followed at [facility].   [name] Atherosclerosis of aorta     by CT scan in past  . Hypotension     orthostatic. dehydration. hospitalized 11/11  . Stroke     "2 old strokes" CT and MRI.  hospital 11/11. diagnosis was dehydration, no acutal reports.   Waymon Budge gait     August, 2012  . Sleep apnea     Significant obstructive sleep apnea diagnosed in August, 2012, the patient is to receive CPAP   . Excessive sweating   . Nausea  No past surgical history on file.  ROS  Patient denies fever, chills, headache, , rash, change in vision, change in hearing, chest pain, Cough, urinary symptoms.  All  other systems are reviewed and are negative other than the history of present illness  PHYSICAL EXAM Patient is here with his wife today.  He is oriented to person time and place.  Affect is normal.  His face is slightly flushed.  He is slightly warm and moist to touch.  He feels good at this time.  Head is atraumatic.  There is no xanthelasma.  There is no jugular venous distention.  Lungs are clear.  Respiratory effort is nonlabored.  Cardiac exam reveals S1-S2.  There is a soft systolic murmur.  The abdomen is soft.  There is no peripheral edema.  There are no musculoskeletal deformities.  There no skin rashes.  The rhythm is irregularly irregular. Filed Vitals:   06/15/11 1450  BP: 144/76  Pulse: 100  Height: 5\' 9"  (1.753 m)  Weight: 221 lb (100.245 kg)    EKG is done today and reviewed by me.  He has old atrial fibrillation.  Her resting rate is 100 which is increased for him. ASSESSMENT & PLAN

## 2011-07-04 ENCOUNTER — Ambulatory Visit: Payer: Medicare Other | Admitting: Cardiology

## 2011-07-04 DIAGNOSIS — I959 Hypotension, unspecified: Secondary | ICD-10-CM | POA: Insufficient documentation

## 2011-07-04 DIAGNOSIS — Z8673 Personal history of transient ischemic attack (TIA), and cerebral infarction without residual deficits: Secondary | ICD-10-CM | POA: Insufficient documentation

## 2011-07-04 DIAGNOSIS — R748 Abnormal levels of other serum enzymes: Secondary | ICD-10-CM | POA: Insufficient documentation

## 2011-07-04 DIAGNOSIS — N2889 Other specified disorders of kidney and ureter: Secondary | ICD-10-CM | POA: Insufficient documentation

## 2011-07-04 DIAGNOSIS — I639 Cerebral infarction, unspecified: Secondary | ICD-10-CM | POA: Insufficient documentation

## 2011-07-04 DIAGNOSIS — I700 Atherosclerosis of aorta: Secondary | ICD-10-CM | POA: Insufficient documentation

## 2011-07-04 DIAGNOSIS — S0990XA Unspecified injury of head, initial encounter: Secondary | ICD-10-CM | POA: Insufficient documentation

## 2011-07-04 DIAGNOSIS — R7989 Other specified abnormal findings of blood chemistry: Secondary | ICD-10-CM | POA: Insufficient documentation

## 2011-07-06 ENCOUNTER — Ambulatory Visit (INDEPENDENT_AMBULATORY_CARE_PROVIDER_SITE_OTHER): Payer: Medicare Other | Admitting: Cardiology

## 2011-07-06 ENCOUNTER — Encounter (INDEPENDENT_AMBULATORY_CARE_PROVIDER_SITE_OTHER): Payer: Medicare Other | Admitting: *Deleted

## 2011-07-06 ENCOUNTER — Encounter: Payer: Self-pay | Admitting: Cardiology

## 2011-07-06 VITALS — BP 120/74 | HR 103 | Wt 219.4 lb

## 2011-07-06 DIAGNOSIS — R269 Unspecified abnormalities of gait and mobility: Secondary | ICD-10-CM

## 2011-07-06 DIAGNOSIS — Z8679 Personal history of other diseases of the circulatory system: Secondary | ICD-10-CM

## 2011-07-06 DIAGNOSIS — R110 Nausea: Secondary | ICD-10-CM

## 2011-07-06 DIAGNOSIS — R2681 Unsteadiness on feet: Secondary | ICD-10-CM

## 2011-07-06 DIAGNOSIS — R61 Generalized hyperhidrosis: Secondary | ICD-10-CM

## 2011-07-06 DIAGNOSIS — I4891 Unspecified atrial fibrillation: Secondary | ICD-10-CM

## 2011-07-06 DIAGNOSIS — I6350 Cerebral infarction due to unspecified occlusion or stenosis of unspecified cerebral artery: Secondary | ICD-10-CM

## 2011-07-06 DIAGNOSIS — I639 Cerebral infarction, unspecified: Secondary | ICD-10-CM

## 2011-07-06 DIAGNOSIS — I4892 Unspecified atrial flutter: Secondary | ICD-10-CM

## 2011-07-06 LAB — POCT INR: INR: 2

## 2011-07-06 NOTE — Patient Instructions (Signed)
Your physician recommends that you schedule a follow-up appointment in:  3 MONTHS WITH DR KATZ Your physician recommends that you continue on your current medications as directed. Please refer to the Current Medication list given to you today. 

## 2011-07-06 NOTE — Assessment & Plan Note (Signed)
The nausea that he was having at the time of his last visit appears to be improved.

## 2011-07-06 NOTE — Progress Notes (Signed)
HPI The patient is seen today for followup of multiple cardiac issues.  See my note of June 15, 2011.  At that time the patient was having spells with sweating, nausea, and unsteady gait.  The exact etiology was not clear to me.  His atrial fib rate has been controlled.  He has hypertrophic cardiomyopathy, but he does not have a significant left ventricular outflow tract gradient.  He had been seen by ENT and it was felt he was not having significant ENT problems.  He was in the process of having his CPAP started for sleep apnea.  This has been started since that visit and he is sleeping better and feeling better.  The etiology of his unstable gait was unclear.  As noted he had had HTN MRI in [place] showing 2 old strokes.  He will be seeing neurology for further evaluation.  He was not orthostatic in the office.  He did have slight decrease in pressure from the lying to sitting position.  There was slight dizziness but this was not the feeling that he gets with his "unsteady gait.  I felt that orthostasis was not the basis of his unsteady gait.  However we have been making sure that he was volume loaded over the past weeks.  I decided at that last visit to put him on a small dose of Florinef.  However I was concerned that he might become fluid overloaded.  He did start the Florinef and in fact he did develop some increased fluid in shortness of breath.  He knew to stop the Florinef and he is stable.  Today he returns for followup. Allergies  Allergen Reactions  . Mycinettes     Current Outpatient Prescriptions  Medication Sig Dispense Refill  . Bimatoprost (LUMIGAN OP) Apply 1 drop to eye daily.        . Calcium-Vitamin D 600-200 MG-UNIT per tablet Take 1 tablet by mouth daily.        [name] co-enzyme Q-10 30 MG capsule Take 30 mg by mouth daily.        . digoxin (LANOXIN) 0.125 MG tablet Take 1 tablet (125 mcg total) by mouth daily.  34 tablet  6  . fish oil-omega-3 fatty acids 1000 MG capsule  Take 2 g by mouth 2 (two) times daily.        . fludrocortisone (FLORINEF) 0.1 MG tablet Take 1 tablet (0.1 mg total) by mouth daily.  30 tablet  6  . Gelatin Capsules, Empty, CAPS 4 capsules by Does not apply route 2 (two) times daily.        . Glucosamine 500 MG CAPS Take 1 capsule by mouth daily.        [name] GRAPE SEED CR PO Take 1 capsule by mouth 2 (two) times daily.        [name] levothyroxine (SYNTHROID, LEVOTHROID) 50 MCG tablet Take 50 mcg by mouth daily.        . metFORMIN (GLUCOPHAGE) 500 MG tablet Take 500 mg by mouth daily.        . Multiple Vitamin (MULTIVITAMIN) tablet Take 1 tablet by mouth daily.        [name] POTASSIUM PO Take 1 tablet by mouth daily.        . simvastatin (ZOCOR) 40 MG tablet Take 20 mg by mouth at bedtime.        [name] testosterone cypionate (DEPOTESTOTERONE CYPIONATE) 200 MG/ML injection Inject 200 mg into the muscle every 28 (twenty-eight) days.        [name]  verapamil (COVERA HS) 240 MG (CO) 24 hr tablet Take 1 tablet (240 mg total) by mouth at bedtime.      . vitamin B-12 (CYANOCOBALAMIN) 500 MCG tablet Take 500 mcg by mouth daily.        . vitamin C (ASCORBIC ACID) 500 MG tablet Take 500 mg by mouth daily.        . vitamin E 600 UNIT capsule Take 600 Units by mouth daily.        [name] warfarin (COUMADIN) 5 MG tablet Take as directed by Anticoagulation clinic   90 tablet  0    History   Social History  . Marital Status: Married    Spouse Name: N/A    Number of Children: N/A  . Years of Education: N/A   Occupational History  . Not on file.   Social History Main Topics  . Smoking status: Former Smoker    Types: Cigars  . Smokeless tobacco: Not on file  . Alcohol Use: No  . Drug Use: No  . Sexually Active: Not on file   Other Topics Concern  . Not on file   Social History Narrative   Married, retired, gets regular exercise.     No family history on file.  Past Medical History  Diagnosis Date  . Hypertrophic obstructive cardiomyopathy     mild SAM of the  mitral valve, but no LVOT obstrcution. echo 11/09 EF 55%. [facility] -cho-11/11. mild regurgitation - echo - ``/09  . Head injury     when slipped on ice 2004-2005. stablaized and back on Coumadin   . Hypercholesterolemia     treated.   . Atrial fibrillation     and aflutter. pt has a left atrial circuit that is not ablated. was on amiodarone-stopped, now use rate control.   . Elevated liver enzymes     over time; hx  . Bladder cancer     hx; treated with BCG in past   . Diabetes mellitus     type not specified  . TSH elevation     on synthroid historically   . Homocystinemia     elevated, mild   . Glaucoma   . Gout   . Kidney mass     laproscopic surgery woth cryoablation of a mass outside kidney followed at [facility]   [name] Atherosclerosis of aorta     by CT scan in past  . Hypotension     orthostatic. dehydration. hospitalized 11/11  . Stroke     "2 old strokes" CT and MRI. [place] [facility] 11/11. diagnosis was dehydration, no acutal reports.   Waymon Budge gait     August, 2012  . Sleep apnea     Significant obstructive sleep apnea diagnosed in August, 2012, the patient is to receive CPAP   . Excessive sweating   . Nausea     No past surgical history on file.  ROS  Patient denies fever, chills, headache, sweats, rash, change in vision, change in hearing, chest pain, cough, nausea vomiting, urinary symptoms.  All other systems are reviewed and are negative.  PHYSICAL EXAM Patient is here with his wife today.  He is oriented to person time and place.  Affect is normal.  There is no jugular venous distention.  Lungs are clear.  Respiratory effort is nonlabored.  Cardiac exam reveals S1 and S2.  There is a soft systolic murmur.  The rhythm is irregularly irregular.  The abdomen is soft.  There is no peripheral  edema. Filed Vitals:   07/06/11 1123  BP: 120/74  Pulse: 103  Weight: 219 lb 6.4 oz (99.519 kg)    EKG is done today and again shows atrial  fibrillation.  ASSESSMENT & PLAN

## 2011-07-06 NOTE — Assessment & Plan Note (Signed)
I have liberalized the patient's salt intake.  He knows to keep himself hydrated.  A small dose of Florinef was too much for him and he was becoming fluid overloaded.  This has been stopped.  He actually has had some mild shortness of breath and this is probably on the basis of excess fluid.  I do not believe he is having significant orthostatic symptoms at this time.

## 2011-07-06 NOTE — Assessment & Plan Note (Signed)
I do not have an etiology for his excess sweating.  I feel it is not cardiac in origin.

## 2011-07-06 NOTE — Assessment & Plan Note (Signed)
We know that the patient's CT and MRI done in [facility] November, 2011 had suggested to old strokes.  Patient will be seen by neurology for more complete evaluation in the near future.

## 2011-07-06 NOTE — Assessment & Plan Note (Signed)
He has not had significant unsteady gait since the last visit.  With Florinef he became fluid overloaded and we'll stop this.  He is scheduled to see neurology for further workup.  He mentions to me that his wife has been diagnosed Tape Worm.  It seems that she may have gotten this with some other travel.  He will be checking with his primary physician to see if any type of testing should be done.  We do not know this could play a role with his neurologic status.

## 2011-07-06 NOTE — Assessment & Plan Note (Signed)
His atrial fibrillation rate is controlled. No change in therapy. 

## 2011-07-11 ENCOUNTER — Emergency Department: Payer: Self-pay | Admitting: Unknown Physician Specialty

## 2011-07-13 ENCOUNTER — Encounter: Payer: Medicare Other | Admitting: *Deleted

## 2011-08-03 ENCOUNTER — Ambulatory Visit (INDEPENDENT_AMBULATORY_CARE_PROVIDER_SITE_OTHER): Payer: Medicare Other | Admitting: *Deleted

## 2011-08-03 DIAGNOSIS — I4892 Unspecified atrial flutter: Secondary | ICD-10-CM

## 2011-08-03 DIAGNOSIS — I4891 Unspecified atrial fibrillation: Secondary | ICD-10-CM

## 2011-08-03 LAB — POCT INR: INR: 2.5

## 2011-08-31 ENCOUNTER — Ambulatory Visit (INDEPENDENT_AMBULATORY_CARE_PROVIDER_SITE_OTHER): Payer: Self-pay | Admitting: Cardiology

## 2011-08-31 ENCOUNTER — Encounter: Payer: Medicare Other | Admitting: *Deleted

## 2011-08-31 DIAGNOSIS — I4892 Unspecified atrial flutter: Secondary | ICD-10-CM

## 2011-08-31 DIAGNOSIS — R0989 Other specified symptoms and signs involving the circulatory and respiratory systems: Secondary | ICD-10-CM

## 2011-08-31 DIAGNOSIS — I4891 Unspecified atrial fibrillation: Secondary | ICD-10-CM

## 2011-10-01 ENCOUNTER — Ambulatory Visit: Payer: Medicare Other | Admitting: Cardiology

## 2011-10-01 ENCOUNTER — Encounter: Payer: Medicare Other | Admitting: *Deleted

## 2011-10-02 ENCOUNTER — Other Ambulatory Visit: Payer: Self-pay | Admitting: Cardiology

## 2011-10-12 ENCOUNTER — Ambulatory Visit (INDEPENDENT_AMBULATORY_CARE_PROVIDER_SITE_OTHER): Payer: Medicare Other | Admitting: *Deleted

## 2011-10-12 ENCOUNTER — Encounter: Payer: Self-pay | Admitting: Cardiology

## 2011-10-12 ENCOUNTER — Ambulatory Visit (INDEPENDENT_AMBULATORY_CARE_PROVIDER_SITE_OTHER): Payer: Medicare Other | Admitting: Cardiology

## 2011-10-12 VITALS — BP 130/80 | HR 90 | Ht 69.0 in | Wt 218.0 lb

## 2011-10-12 DIAGNOSIS — R2681 Unsteadiness on feet: Secondary | ICD-10-CM

## 2011-10-12 DIAGNOSIS — I4891 Unspecified atrial fibrillation: Secondary | ICD-10-CM

## 2011-10-12 DIAGNOSIS — I4892 Unspecified atrial flutter: Secondary | ICD-10-CM

## 2011-10-12 DIAGNOSIS — Z8679 Personal history of other diseases of the circulatory system: Secondary | ICD-10-CM

## 2011-10-12 DIAGNOSIS — R269 Unspecified abnormalities of gait and mobility: Secondary | ICD-10-CM

## 2011-10-12 DIAGNOSIS — I495 Sick sinus syndrome: Secondary | ICD-10-CM

## 2011-10-12 LAB — POCT INR: INR: 1.9

## 2011-10-12 NOTE — Assessment & Plan Note (Signed)
Atrial fibrillation rate is controlled. No change in therapy. 

## 2011-10-12 NOTE — Assessment & Plan Note (Signed)
Etiology of the patient's episodes of unsteady gait with some dizziness and nausea is not clear.This may be multifactorial. I am not pursuing any further cardiac workup. He says that he feels better off testosterone. We will follow him. As mentioned his neurologic workup did not lead to any results.

## 2011-10-12 NOTE — Assessment & Plan Note (Signed)
It is possible that the patient's history of orthostatic hypotension play some role in his intermittent unsteady gait. However his volume and pressures are stable now. No change in therapy.

## 2011-10-12 NOTE — Progress Notes (Signed)
HPI Patient is seen today for followup of atrial fibrillation. I saw him last in September, 2012. He had some problems with unsteady gait and some orthostatic hypotension. At one point I used a small dose of Florinef. He became volume overloaded and this was stopped. He is careful to be sure that he keeps himself hydrated.  I had arranged for him to have a neurology evaluation for the question of his unsteady gait. He was seen on July 16, 2011. There is a copy of the neurology report scanned into the medical record. It mentions some peripheral neuropathy probably related to his diabetes. It mentions the possibility of some other studies that were not arranged. There was no followup made. Later on the same day the patient had more problems with unsteady gait and nausea. He was assessed fully in the emergency room at [facility]. EKG was unchanged and he tells me that an MRI showed no significant change.  The patient also tells me that he decided that some of his problems might be related to testosterone. He stopped taking this and he feels much better. He has not had any recurrent spells.  As part of today's evaluation I have spent an excessive amount of time reviewing the neurology report and reviewing the history with the patient concerning all of the events that occurred on July 16, 2011. Allergies  Allergen Reactions  . Mycinettes     Current Outpatient Prescriptions  Medication Sig Dispense Refill  . Bimatoprost (LUMIGAN OP) Apply 1 drop to eye daily.        . Calcium-Vitamin D 600-200 MG-UNIT per tablet Take 1 tablet by mouth daily.        [name] co-enzyme Q-10 30 MG capsule Take 30 mg by mouth daily.        . digoxin (LANOXIN) 0.125 MG tablet Take 1 tablet (125 mcg total) by mouth daily.  34 tablet  6  . fish oil-omega-3 fatty acids 1000 MG capsule Take 2 g by mouth 2 (two) times daily.        . Gelatin Capsules, Empty, CAPS 4 capsules by Does not apply route 2 (two) times daily.        .  Glucosamine 500 MG CAPS Take 1 capsule by mouth daily.        [name] GRAPE SEED CR PO Take 1 capsule by mouth 2 (two) times daily.        [name] levothyroxine (SYNTHROID, LEVOTHROID) 50 MCG tablet Take 50 mcg by mouth daily.        . metFORMIN (GLUCOPHAGE) 500 MG tablet Take 500 mg by mouth daily.        . Multiple Vitamin (MULTIVITAMIN) tablet Take 1 tablet by mouth daily.        [name] POTASSIUM PO Take 1 tablet by mouth daily.        . simvastatin (ZOCOR) 40 MG tablet Take 20 mg by mouth at bedtime.        . verapamil (COVERA HS) 240 MG (CO) 24 hr tablet Take 1 tablet (240 mg total) by mouth at bedtime.      . vitamin B-12 (CYANOCOBALAMIN) 500 MCG tablet Take 500 mcg by mouth daily.        . vitamin C (ASCORBIC ACID) 500 MG tablet Take 500 mg by mouth daily.        . vitamin E 600 UNIT capsule Take 600 Units by mouth daily.        [name] warfarin (COUMADIN) 5 MG tablet  TAKE AS DIRECTED BY ANTICOAGULATION CLINIC  90 tablet  2    History   Social History  . Marital Status: Married    Spouse Name: N/A    Number of Children: N/A  . Years of Education: N/A   Occupational History  . Not on file.   Social History Main Topics  . Smoking status: Current Some Day Smoker    Types: Cigarettes, Cigars  . Smokeless tobacco: Not on file  . Alcohol Use: No  . Drug Use: No  . Sexually Active: Not on file   Other Topics Concern  . Not on file   Social History Narrative   Married, retired, gets regular exercise.     No family history on file.  Past Medical History  Diagnosis Date  . Hypertrophic obstructive cardiomyopathy     mild SAM of the mitral valve, but no LVOT obstrcution. echo 11/09 EF 55%. [place] [facility] -cho-11/11. mild regurgitation - echo - ``/09  . Head injury     when slipped on ice 2004-2005. stablaized and back on Coumadin   . Hypercholesterolemia     treated.   . Atrial fibrillation     and aflutter. pt has a left atrial circuit that is not ablated. was on amiodarone-stopped, now  use rate control.   . Elevated liver enzymes     over time; hx  . Bladder cancer     hx; treated with BCG in past   . Diabetes mellitus     type not specified  . TSH elevation     on synthroid historically   . Homocystinemia     elevated, mild   . Glaucoma   . Gout   . Kidney mass     laproscopic surgery woth cryoablation of a mass outside kidney followed at [facility].   [name] Atherosclerosis of aorta     by CT scan in past  . Hypotension     orthostatic. dehydration. hospitalized 11/11  . Stroke     "2 old strokes" CT and MRI. [place] [facility] 11/11. diagnosis was dehydration, no acutal reports.   Waymon Budge gait     August, 2012  . Sleep apnea     Significant obstructive sleep apnea diagnosed in August, 2012, the patient is to receive CPAP   . Excessive sweating   . Nausea     No past surgical history on file.  ROS  Patient denies fever, chills, headache, sweats, rash, change in vision, change in hearing, chest pain, cough, urinary symptoms. All other systems are reviewed and are negative.  PHYSICAL EXAM  He actually feels well today. He's here with his wife. He is oriented to person time and place. Affect is normal. There is no jugulovenous distention. Lungs are clear. Respiratory effort is not labored. Cardiac exam reveals S1 and S2. There is a soft systolic murmur. The abdomen is soft. There is no peripheral edema. There no musculoskeletal deformities. There no skin rashes.  Filed Vitals:   10/12/11 0953  BP: 130/80  Pulse: 90  Height: 5\' 9"  (1.753 m)  Weight: 218 lb (98.884 kg)    EKG  EKG is done and reviewed by me. There is old atrial fibrillation. The rate is controlled. There are nonspecific ST-T wave changes.  ASSESSMENT & PLAN

## 2011-10-12 NOTE — Patient Instructions (Signed)
Your physician recommends that you schedule a follow-up appointment in: 3 months.  

## 2011-10-17 ENCOUNTER — Other Ambulatory Visit: Payer: Self-pay

## 2011-10-17 DIAGNOSIS — I4891 Unspecified atrial fibrillation: Secondary | ICD-10-CM

## 2011-10-17 DIAGNOSIS — R2681 Unsteadiness on feet: Secondary | ICD-10-CM

## 2011-10-17 DIAGNOSIS — R110 Nausea: Secondary | ICD-10-CM

## 2011-10-17 DIAGNOSIS — G4730 Sleep apnea, unspecified: Secondary | ICD-10-CM

## 2011-10-17 DIAGNOSIS — R61 Generalized hyperhidrosis: Secondary | ICD-10-CM

## 2011-10-17 DIAGNOSIS — Z8679 Personal history of other diseases of the circulatory system: Secondary | ICD-10-CM

## 2011-10-17 DIAGNOSIS — E119 Type 2 diabetes mellitus without complications: Secondary | ICD-10-CM

## 2011-10-17 MED ORDER — DIGOXIN 125 MCG PO TABS: 125.0000 ug | ORAL_TABLET | Freq: Every day | ORAL | Status: DC

## 2011-11-09 ENCOUNTER — Ambulatory Visit (INDEPENDENT_AMBULATORY_CARE_PROVIDER_SITE_OTHER): Payer: Medicare Other | Admitting: *Deleted

## 2011-11-09 DIAGNOSIS — I4891 Unspecified atrial fibrillation: Secondary | ICD-10-CM

## 2011-11-09 DIAGNOSIS — I4892 Unspecified atrial flutter: Secondary | ICD-10-CM

## 2011-11-09 LAB — POCT INR: INR: 2.1

## 2011-12-04 ENCOUNTER — Ambulatory Visit (INDEPENDENT_AMBULATORY_CARE_PROVIDER_SITE_OTHER): Payer: Medicare Other

## 2011-12-04 DIAGNOSIS — I4891 Unspecified atrial fibrillation: Secondary | ICD-10-CM

## 2011-12-04 DIAGNOSIS — I4892 Unspecified atrial flutter: Secondary | ICD-10-CM

## 2011-12-04 LAB — POCT INR: INR: 2.1

## 2011-12-07 ENCOUNTER — Encounter: Payer: Medicare Other | Admitting: *Deleted

## 2011-12-31 ENCOUNTER — Ambulatory Visit: Payer: Medicare Other | Admitting: Cardiology

## 2011-12-31 ENCOUNTER — Ambulatory Visit (INDEPENDENT_AMBULATORY_CARE_PROVIDER_SITE_OTHER): Payer: Medicare Other

## 2011-12-31 DIAGNOSIS — I4891 Unspecified atrial fibrillation: Secondary | ICD-10-CM

## 2011-12-31 DIAGNOSIS — I4892 Unspecified atrial flutter: Secondary | ICD-10-CM

## 2011-12-31 LAB — POCT INR: INR: 2.4

## 2012-01-09 ENCOUNTER — Other Ambulatory Visit: Payer: Self-pay | Admitting: Cardiology

## 2012-02-12 ENCOUNTER — Encounter: Payer: Self-pay | Admitting: Cardiology

## 2012-02-12 ENCOUNTER — Ambulatory Visit (INDEPENDENT_AMBULATORY_CARE_PROVIDER_SITE_OTHER): Payer: Medicare Other | Admitting: Cardiology

## 2012-02-12 ENCOUNTER — Ambulatory Visit (INDEPENDENT_AMBULATORY_CARE_PROVIDER_SITE_OTHER): Payer: Medicare Other | Admitting: *Deleted

## 2012-02-12 VITALS — BP 104/54 | HR 70 | Ht 69.0 in | Wt 209.0 lb

## 2012-02-12 DIAGNOSIS — I4892 Unspecified atrial flutter: Secondary | ICD-10-CM

## 2012-02-12 DIAGNOSIS — I4891 Unspecified atrial fibrillation: Secondary | ICD-10-CM

## 2012-02-12 DIAGNOSIS — Z8679 Personal history of other diseases of the circulatory system: Secondary | ICD-10-CM

## 2012-02-12 DIAGNOSIS — N2889 Other specified disorders of kidney and ureter: Secondary | ICD-10-CM

## 2012-02-12 DIAGNOSIS — N289 Disorder of kidney and ureter, unspecified: Secondary | ICD-10-CM

## 2012-02-12 LAB — POCT INR: INR: 2.2

## 2012-02-12 NOTE — Assessment & Plan Note (Signed)
He's doing very well with this. In general he's not bothered. Rarely he stands up too rapidly and has some dizziness. He knows to be careful.

## 2012-02-12 NOTE — Progress Notes (Signed)
HPI Patient is doing very well. His atrial fib is under good control. He is losing weight colectomy and he is more active and he is exercising and looks much better. He's not had any chest pain.  Allergies  Allergen Reactions  . Mycinettes     Current Outpatient Prescriptions  Medication Sig Dispense Refill  . Bimatoprost (LUMIGAN OP) Apply 1 drop to eye daily.        . Calcium-Vitamin D 600-200 MG-UNIT per tablet Take 1 tablet by mouth daily.        [name] co-enzyme Q-10 30 MG capsule Take 30 mg by mouth daily.        . digoxin (LANOXIN) 0.125 MG tablet Take 1 tablet (125 mcg total) by mouth daily.  90 tablet  1  . fish oil-omega-3 fatty acids 1000 MG capsule Take 2 g by mouth 2 (two) times daily.        . Gelatin Capsules, Empty, CAPS 4 capsules by Does not apply route 2 (two) times daily.        . Glucosamine 500 MG CAPS Take 1 capsule by mouth daily.        [name] GRAPE SEED CR PO Take 1 capsule by mouth 2 (two) times daily.        [name] levothyroxine (SYNTHROID, LEVOTHROID) 50 MCG tablet Take 50 mcg by mouth daily.        . metFORMIN (GLUCOPHAGE) 500 MG tablet Take 500 mg by mouth daily.        . Multiple Vitamin (MULTIVITAMIN) tablet Take 1 tablet by mouth daily.        [name] POTASSIUM PO Take 1 tablet by mouth daily.        . simvastatin (ZOCOR) 40 MG tablet TAKE ONE-HALF TABLET BY MOUTH EVERY DAY AT BEDTIME  30 tablet  6  . verapamil (COVERA HS) 240 MG (CO) 24 hr tablet Take 1 tablet (240 mg total) by mouth at bedtime.      . vitamin B-12 (CYANOCOBALAMIN) 500 MCG tablet Take 500 mcg by mouth daily.        . vitamin C (ASCORBIC ACID) 500 MG tablet Take 500 mg by mouth daily.        . vitamin E 600 UNIT capsule Take 600 Units by mouth daily.        [name] warfarin (COUMADIN) 5 MG tablet TAKE AS DIRECTED BY ANTICOAGULATION CLINIC  90 tablet  2    History   Social History  . Marital Status: Married    Spouse Name: N/A    Number of Children: N/A  . Years of Education: N/A   Occupational History   . Not on file.   Social History Main Topics  . Smoking status: Current Some Day Smoker    Types: Cigars  . Smokeless tobacco: Not on file  . Alcohol Use: No  . Drug Use: No  . Sexually Active: Not on file   Other Topics Concern  . Not on file   Social History Narrative   Married, retired, gets regular exercise.     No family history on file.  Past Medical History  Diagnosis Date  . Hypertrophic obstructive cardiomyopathy     mild SAM of the mitral valve, but no LVOT obstrcution. echo 11/09 EF 55%. [place] [facility] -cho-11/11. mild regurgitation - echo - ``/09  . Head injury     when slipped on ice 2004-2005. stablaized and back on Coumadin   . Hypercholesterolemia     treated.   [name]  Atrial fibrillation     and aflutter. pt has a left atrial circuit that is not ablated. was on amiodarone-stopped, now use rate control.   . Elevated liver enzymes     over time; hx  . Bladder cancer     hx; treated with BCG in past   . Diabetes mellitus     type not specified  . TSH elevation     on synthroid historically   . Homocystinemia     elevated, mild   . Glaucoma   . Gout   . Kidney mass     laproscopic surgery woth cryoablation of a mass outside kidney followed at [facility].   [name] Atherosclerosis of aorta     by CT scan in past  . Hypotension     orthostatic. dehydration. hospitalized 11/11  . Stroke     "2 old strokes" CT and MRI.  hospital 11/11. diagnosis was dehydration, no acutal reports.   Waymon Budge gait     August, 2012  . Sleep apnea     Significant obstructive sleep apnea diagnosed in August, 2012, the patient is to receive CPAP   . Excessive sweating   . Nausea     No past surgical history on file.  ROS   Patient denies fever, chills, headache, sweats, rash, change in vision, change in hearing, chest pain, cough, nausea vomiting, urinary symptoms. All of the systems are reviewed and are negative.  PHYSICAL EXAM  Patient is oriented to person time and  place. Affect is normal. He's here with his wife. There is no jugulovenous distention. Lungs are clear. Respiratory effort is nonlabored. Cardiac exam reveals S1 and S2. The rhythm is irregularly irregular. The abdomen is soft. There is no peripheral edema.  Filed Vitals:   02/12/12 1545  BP: 104/54  Pulse: 70  Height: 5\' 9"  (1.753 m)  Weight: 209 lb (94.802 kg)     ASSESSMENT & PLAN

## 2012-02-12 NOTE — Assessment & Plan Note (Signed)
Recently he was seen at [facility]. He has now been cleared for a three-year followup. It appears that he is had definitive treatment of the mass in his kidney.

## 2012-02-12 NOTE — Patient Instructions (Signed)
Your physician wants you to follow-up in:  6 months. You will receive a reminder letter in the mail two months in advance. If you don't receive a letter, please call our office to schedule the follow-up appointment.   

## 2012-02-12 NOTE — Assessment & Plan Note (Signed)
Atrial fibrillation rate is controlled. No change in therapy. 

## 2012-02-25 ENCOUNTER — Other Ambulatory Visit: Payer: Self-pay | Admitting: Cardiology

## 2012-03-25 ENCOUNTER — Ambulatory Visit (INDEPENDENT_AMBULATORY_CARE_PROVIDER_SITE_OTHER): Payer: Medicare Other

## 2012-03-25 DIAGNOSIS — I4892 Unspecified atrial flutter: Secondary | ICD-10-CM

## 2012-03-25 DIAGNOSIS — I4891 Unspecified atrial fibrillation: Secondary | ICD-10-CM

## 2012-03-27 ENCOUNTER — Encounter: Payer: Self-pay | Admitting: Cardiology

## 2012-04-19 ENCOUNTER — Other Ambulatory Visit: Payer: Self-pay | Admitting: Cardiology

## 2012-04-21 NOTE — Telephone Encounter (Signed)
Refilled digoxin 

## 2012-04-29 ENCOUNTER — Ambulatory Visit (INDEPENDENT_AMBULATORY_CARE_PROVIDER_SITE_OTHER): Payer: Medicare Other

## 2012-04-29 DIAGNOSIS — I4891 Unspecified atrial fibrillation: Secondary | ICD-10-CM

## 2012-04-29 DIAGNOSIS — I4892 Unspecified atrial flutter: Secondary | ICD-10-CM

## 2012-06-09 DIAGNOSIS — Z87448 Personal history of other diseases of urinary system: Secondary | ICD-10-CM | POA: Insufficient documentation

## 2012-06-09 DIAGNOSIS — N400 Enlarged prostate without lower urinary tract symptoms: Secondary | ICD-10-CM | POA: Insufficient documentation

## 2012-06-09 DIAGNOSIS — Z8603 Personal history of neoplasm of uncertain behavior: Secondary | ICD-10-CM | POA: Insufficient documentation

## 2012-06-10 ENCOUNTER — Ambulatory Visit (INDEPENDENT_AMBULATORY_CARE_PROVIDER_SITE_OTHER): Payer: Medicare Other | Admitting: *Deleted

## 2012-06-10 DIAGNOSIS — I4892 Unspecified atrial flutter: Secondary | ICD-10-CM

## 2012-06-10 DIAGNOSIS — I4891 Unspecified atrial fibrillation: Secondary | ICD-10-CM

## 2012-06-10 LAB — POCT INR: INR: 1.8

## 2012-07-08 ENCOUNTER — Ambulatory Visit (INDEPENDENT_AMBULATORY_CARE_PROVIDER_SITE_OTHER): Payer: Medicare Other | Admitting: *Deleted

## 2012-07-08 DIAGNOSIS — I4892 Unspecified atrial flutter: Secondary | ICD-10-CM

## 2012-07-08 DIAGNOSIS — I4891 Unspecified atrial fibrillation: Secondary | ICD-10-CM

## 2012-07-08 LAB — POCT INR: INR: 1.9

## 2012-07-22 ENCOUNTER — Ambulatory Visit (INDEPENDENT_AMBULATORY_CARE_PROVIDER_SITE_OTHER): Payer: Medicare Other | Admitting: *Deleted

## 2012-07-22 DIAGNOSIS — I4891 Unspecified atrial fibrillation: Secondary | ICD-10-CM

## 2012-07-22 DIAGNOSIS — I4892 Unspecified atrial flutter: Secondary | ICD-10-CM

## 2012-07-22 LAB — POCT INR: INR: 2.2

## 2012-08-08 ENCOUNTER — Other Ambulatory Visit: Payer: Self-pay | Admitting: Cardiology

## 2012-08-18 ENCOUNTER — Ambulatory Visit (INDEPENDENT_AMBULATORY_CARE_PROVIDER_SITE_OTHER): Payer: Medicare Other | Admitting: Cardiology

## 2012-08-18 ENCOUNTER — Ambulatory Visit (INDEPENDENT_AMBULATORY_CARE_PROVIDER_SITE_OTHER): Payer: Medicare Other | Admitting: *Deleted

## 2012-08-18 ENCOUNTER — Encounter: Payer: Self-pay | Admitting: Cardiology

## 2012-08-18 VITALS — BP 110/60 | HR 94 | Ht 69.5 in | Wt 198.4 lb

## 2012-08-18 DIAGNOSIS — R269 Unspecified abnormalities of gait and mobility: Secondary | ICD-10-CM

## 2012-08-18 DIAGNOSIS — Z8679 Personal history of other diseases of the circulatory system: Secondary | ICD-10-CM

## 2012-08-18 DIAGNOSIS — I4891 Unspecified atrial fibrillation: Secondary | ICD-10-CM

## 2012-08-18 DIAGNOSIS — R2681 Unsteadiness on feet: Secondary | ICD-10-CM

## 2012-08-18 DIAGNOSIS — I4892 Unspecified atrial flutter: Secondary | ICD-10-CM

## 2012-08-18 LAB — POCT INR: INR: 2.1

## 2012-08-18 NOTE — Assessment & Plan Note (Signed)
He's not having any problems as long as he stands up carefully after sitting.

## 2012-08-18 NOTE — Patient Instructions (Addendum)
Your physician wants you to follow-up in:  6 months. You will receive a reminder letter in the mail two months in advance. If you don't receive a letter, please call our office to schedule the follow-up appointment.   

## 2012-08-18 NOTE — Progress Notes (Signed)
Patient ID: [name] [name], male   DOB: 05-15-1934, 78 y.o.   MRN: [id number]   HPI   Patient is here for follow up of his atrial fibrillation. He's doing well. He's not having any orthostatic symptoms. He is careful to stand up slowly if he's been sitting for a long period of time. He is exercising the muscles in his legs and getting some improvement in the feeling. He says that he has some generalized vascular screening that was good and he will send the results to me.  Allergies  Allergen Reactions  . Mycinettes     Current Outpatient Prescriptions  Medication Sig Dispense Refill  . Bimatoprost (LUMIGAN OP) Apply 1 drop to eye daily.        . Calcium-Vitamin D 600-200 MG-UNIT per tablet Take 1 tablet by mouth daily.        [name] co-enzyme Q-10 30 MG capsule Take 30 mg by mouth daily.        . digoxin (LANOXIN) 0.125 MG tablet TAKE ONE TABLET BY MOUTH EVERY DAY  90 tablet  3  . fish oil-omega-3 fatty acids 1000 MG capsule Take 2 g by mouth 2 (two) times daily.        . Gelatin Capsules, Empty, CAPS 4 capsules by Does not apply route 2 (two) times daily.        . Glucosamine 500 MG CAPS Take 1 capsule by mouth daily.        [name] GRAPE SEED CR PO Take 1 capsule by mouth 2 (two) times daily.        [name] levothyroxine (SYNTHROID, LEVOTHROID) 50 MCG tablet Take 50 mcg by mouth daily.        . metFORMIN (GLUCOPHAGE) 500 MG tablet Take 500 mg by mouth daily.        . Multiple Vitamin (MULTIVITAMIN) tablet Take 1 tablet by mouth daily.        [name] POTASSIUM PO Take 1 tablet by mouth daily.        . simvastatin (ZOCOR) 40 MG tablet TAKE ONE-HALF TABLET BY MOUTH EVERY DAY AT BEDTIME  30 tablet  6  . verapamil (COVERA HS) 240 MG (CO) 24 hr tablet Take 1 tablet (240 mg total) by mouth at bedtime.      . vitamin B-12 (CYANOCOBALAMIN) 500 MCG tablet Take 500 mcg by mouth daily.        . vitamin C (ASCORBIC ACID) 500 MG tablet Take 500 mg by mouth daily.        . vitamin E 600 UNIT capsule Take 600 Units by  mouth daily.        [name] warfarin (COUMADIN) 5 MG tablet TAKE AS DIRECTED BY ANTICOAGULATION CLINIC  90 tablet  1  . [DISCONTINUED] verapamil (CALAN-SR) 240 MG CR tablet TAKE ONE TABLET BY MOUTH TWICE DAILY  180 tablet  1    History   Social History  . Marital Status: Married    Spouse Name: N/A    Number of Children: N/A  . Years of Education: N/A   Occupational History  . Not on file.   Social History Main Topics  . Smoking status: Current Some Day Smoker    Types: Cigars  . Smokeless tobacco: Not on file  . Alcohol Use: No  . Drug Use: No  . Sexually Active: Not on file   Other Topics Concern  . Not on file   Social History Narrative   Married, retired, gets regular exercise.  History reviewed. No pertinent family history.  Past Medical History  Diagnosis Date  . Hypertrophic obstructive cardiomyopathy     mild SAM of the mitral valve, but no LVOT obstrcution. echo 11/09 EF 55%. [place] [facility] -cho-11/11. mild regurgitation - echo - ``/09  . Head injury     when slipped on ice 2004-2005. stablaized and back on Coumadin   . Hypercholesterolemia     treated.   . Atrial fibrillation     and aflutter. pt has a left atrial circuit that is not ablated. was on amiodarone-stopped, now use rate control.   . Elevated liver enzymes     over time; hx  . Bladder cancer     hx; treated with BCG in past   . Diabetes mellitus     type not specified  . TSH elevation     on synthroid historically   . Homocystinemia     elevated, mild   . Glaucoma(365)   . Gout   . Kidney mass     laproscopic surgery woth cryoablation of a mass outside kidney followed at [facility].   [name] Atherosclerosis of aorta     by CT scan in past  . Hypotension     orthostatic. dehydration. hospitalized 11/11  . Stroke     "2 old strokes" CT and MRI. [place] [facility] 11/11. diagnosis was dehydration, no acutal reports.   Waymon Budge gait     August, 2012  . Sleep apnea     Significant obstructive  sleep apnea diagnosed in August, 2012, the patient is to receive CPAP   . Excessive sweating   . Nausea     History reviewed. No pertinent past surgical history.  Patient Active Problem List  Diagnosis  . AODM  . HYPERLIPIDEMIA  . OVERWEIGHT  . MITRAL REGURGITATION  . HOCM  . ATRIAL FIBRILLATION  . ATRIAL FLUTTER  . ORTHOSTATIC HYPOTENSION, HX OF  . Unsteady gait  . Sleep apnea  . Excessive sweating  . Nausea  . Head injury  . Elevated liver enzymes  . TSH elevation  . Kidney mass  . Atherosclerosis of aorta  . Hypotension  . Stroke    ROS   Patient denies fever, chills, headache, sweats, rash, change in vision, change in hearing, chest pain, cough, nausea vomiting, urinary symptoms. All other systems are reviewed and are negative.  PHYSICAL EXAM  Patient is stable. He is oriented to person time and place. Affect is normal. He's here with his wife. He has lost several pounds. He looks good. He seems committed to try to continue to lose more. There is no jugulovenous distention. Lungs are clear. Respiratory effort is nonlabored. Cardiac exam reveals S1 and S2. There no clicks. There no significant murmurs. The abdomen is soft. There is no peripheral edema. There no musculoskeletal deformities. There are no skin rashes.  Filed Vitals:   08/18/12 1202  BP: 110/60  Pulse: 94  Height: 5' 9.5" (1.765 m)  Weight: 198 lb 6.4 oz (89.994 kg)  SpO2: 99%   EKG is done today and reviewed by me. He has atrial fibrillation with a controlled rate. There are nonspecific ST-T wave changes that are old. He has decreased R wave in lead V2 that is also old.  ASSESSMENT & PLAN

## 2012-08-18 NOTE — Assessment & Plan Note (Signed)
Atrial fibrillation rate is controlled. No change in therapy. 

## 2012-08-18 NOTE — Assessment & Plan Note (Signed)
He's not having any further problems with his gait. No change in therapy.

## 2012-09-02 ENCOUNTER — Other Ambulatory Visit: Payer: Self-pay | Admitting: Cardiology

## 2012-09-02 DIAGNOSIS — R61 Generalized hyperhidrosis: Secondary | ICD-10-CM

## 2012-09-02 DIAGNOSIS — R2681 Unsteadiness on feet: Secondary | ICD-10-CM

## 2012-09-02 DIAGNOSIS — G4730 Sleep apnea, unspecified: Secondary | ICD-10-CM

## 2012-09-02 DIAGNOSIS — R110 Nausea: Secondary | ICD-10-CM

## 2012-09-02 DIAGNOSIS — Z8679 Personal history of other diseases of the circulatory system: Secondary | ICD-10-CM

## 2012-09-02 DIAGNOSIS — I4891 Unspecified atrial fibrillation: Secondary | ICD-10-CM

## 2012-09-02 MED ORDER — VERAPAMIL HCL 240 MG (CO) PO TB24: 240.0000 mg | ORAL_TABLET | Freq: Every day | ORAL | Status: DC

## 2012-09-02 NOTE — Telephone Encounter (Signed)
New Problem:    Patient called in needing a temporary refill of his verapamil (COVERA HS) 240 MG (CO) 24 hr tablet sent into the [facility] in [place] (phone [phone], fax- [phone]).

## 2012-09-02 NOTE — Telephone Encounter (Signed)
F/u   Pt called at 10:58am regarding RX, I will click the reorder button to expedite refill, Pt is in [address] [place], [place] , plz call into [facility]   Pt need Verapamil

## 2012-09-23 ENCOUNTER — Encounter: Payer: Self-pay | Admitting: Cardiology

## 2012-09-23 DIAGNOSIS — I779 Disorder of arteries and arterioles, unspecified: Secondary | ICD-10-CM | POA: Insufficient documentation

## 2012-09-23 DIAGNOSIS — I739 Peripheral vascular disease, unspecified: Secondary | ICD-10-CM

## 2012-09-23 NOTE — Progress Notes (Signed)
    Patient had vascular screening done at [facility] October, 2013. ABI was 0.9 on the right and 1.2 in the left. Carotid screening revealed some calcified plaque but no stenosis. Abdominal aorta screening revealed no aneurysm. I've recorded this data in the problem list under the appropriate areas. No further workup is needed.

## 2012-10-01 ENCOUNTER — Ambulatory Visit (INDEPENDENT_AMBULATORY_CARE_PROVIDER_SITE_OTHER): Payer: Medicare Other | Admitting: *Deleted

## 2012-10-01 DIAGNOSIS — I4892 Unspecified atrial flutter: Secondary | ICD-10-CM

## 2012-10-01 DIAGNOSIS — I4891 Unspecified atrial fibrillation: Secondary | ICD-10-CM

## 2012-10-01 LAB — POCT INR: INR: 2.8

## 2012-10-28 ENCOUNTER — Ambulatory Visit (INDEPENDENT_AMBULATORY_CARE_PROVIDER_SITE_OTHER): Payer: Medicare Other | Admitting: *Deleted

## 2012-10-28 DIAGNOSIS — I4892 Unspecified atrial flutter: Secondary | ICD-10-CM

## 2012-10-28 DIAGNOSIS — I4891 Unspecified atrial fibrillation: Secondary | ICD-10-CM

## 2012-10-28 LAB — POCT INR: INR: 1.8

## 2012-11-25 ENCOUNTER — Ambulatory Visit (INDEPENDENT_AMBULATORY_CARE_PROVIDER_SITE_OTHER): Payer: Medicare Other | Admitting: *Deleted

## 2012-11-25 DIAGNOSIS — I4892 Unspecified atrial flutter: Secondary | ICD-10-CM

## 2012-11-25 DIAGNOSIS — I4891 Unspecified atrial fibrillation: Secondary | ICD-10-CM

## 2012-11-25 LAB — POCT INR: INR: 2.1

## 2012-12-22 ENCOUNTER — Ambulatory Visit (INDEPENDENT_AMBULATORY_CARE_PROVIDER_SITE_OTHER): Payer: Medicare Other | Admitting: *Deleted

## 2012-12-22 DIAGNOSIS — I4892 Unspecified atrial flutter: Secondary | ICD-10-CM

## 2012-12-22 DIAGNOSIS — I4891 Unspecified atrial fibrillation: Secondary | ICD-10-CM

## 2012-12-22 LAB — POCT INR: INR: 2.7

## 2013-01-19 ENCOUNTER — Ambulatory Visit (INDEPENDENT_AMBULATORY_CARE_PROVIDER_SITE_OTHER): Payer: Medicare Other | Admitting: *Deleted

## 2013-01-19 DIAGNOSIS — I4891 Unspecified atrial fibrillation: Secondary | ICD-10-CM

## 2013-01-19 DIAGNOSIS — I4892 Unspecified atrial flutter: Secondary | ICD-10-CM

## 2013-01-19 LAB — POCT INR: INR: 2.4

## 2013-02-09 ENCOUNTER — Other Ambulatory Visit: Payer: Self-pay

## 2013-02-09 MED ORDER — SIMVASTATIN 40 MG PO TABS: 20.0000 mg | ORAL_TABLET | Freq: Every day | ORAL | Status: DC

## 2013-02-09 NOTE — Telephone Encounter (Signed)
Patient Instructions    Your physician wants you to follow-up in: 6 months.   You will receive a reminder letter in the mail two months in advance. If you don't receive a letter, please call our office to schedule the follow-up appointment.      Patient Instructions History Recorded     Previous Visit      Provider Department Encounter #    08/08/2012  8:58 AM Willa Rough, MD [facility] [id number]

## 2013-02-12 ENCOUNTER — Other Ambulatory Visit: Payer: Self-pay

## 2013-02-12 MED ORDER — SIMVASTATIN 40 MG PO TABS: 20.0000 mg | ORAL_TABLET | Freq: Every day | ORAL | Status: DC

## 2013-02-12 NOTE — Telephone Encounter (Signed)
Pt called in wanting his simvastatin refilled and sent to [facility] [place]

## 2013-02-16 ENCOUNTER — Ambulatory Visit (INDEPENDENT_AMBULATORY_CARE_PROVIDER_SITE_OTHER): Payer: Medicare Other | Admitting: *Deleted

## 2013-02-16 DIAGNOSIS — I4891 Unspecified atrial fibrillation: Secondary | ICD-10-CM

## 2013-02-16 DIAGNOSIS — I4892 Unspecified atrial flutter: Secondary | ICD-10-CM

## 2013-03-10 ENCOUNTER — Other Ambulatory Visit: Payer: Self-pay

## 2013-03-10 MED ORDER — DIGOXIN 125 MCG PO TABS: 0.1250 mg | ORAL_TABLET | Freq: Every day | ORAL | Status: DC

## 2013-03-10 NOTE — Telephone Encounter (Signed)
digoxin (LANOXIN) 0.125 MG tablet  TAKE ONE TABLET BY MOUTH EVERY DAY   90 tablet   Patient Instructions  Your physician wants you to follow-up in: 6 months.   You will receive a reminder letter in the mail two months in advance. If you don't receive a letter, please call our office to schedule the follow-up appointment. Patient Instructions History Recorded  Previous Visit  Provider Department Encounter #  08/08/2012  8:58 AM Willa Rough, MD [facility] [id number]

## 2013-03-13 ENCOUNTER — Ambulatory Visit: Payer: Medicare Other | Admitting: Cardiology

## 2013-03-13 ENCOUNTER — Ambulatory Visit (INDEPENDENT_AMBULATORY_CARE_PROVIDER_SITE_OTHER): Payer: Medicare Other | Admitting: *Deleted

## 2013-03-13 DIAGNOSIS — I4891 Unspecified atrial fibrillation: Secondary | ICD-10-CM

## 2013-03-13 DIAGNOSIS — I4892 Unspecified atrial flutter: Secondary | ICD-10-CM

## 2013-03-28 ENCOUNTER — Other Ambulatory Visit: Payer: Self-pay | Admitting: Cardiology

## 2013-04-11 ENCOUNTER — Other Ambulatory Visit: Payer: Self-pay | Admitting: Cardiology

## 2013-04-26 ENCOUNTER — Encounter: Payer: Self-pay | Admitting: Cardiology

## 2013-04-28 ENCOUNTER — Ambulatory Visit: Payer: Medicare Other | Admitting: Cardiology

## 2013-04-28 ENCOUNTER — Ambulatory Visit (INDEPENDENT_AMBULATORY_CARE_PROVIDER_SITE_OTHER): Payer: Medicare Other | Admitting: *Deleted

## 2013-04-28 ENCOUNTER — Ambulatory Visit (INDEPENDENT_AMBULATORY_CARE_PROVIDER_SITE_OTHER): Payer: Medicare Other | Admitting: Cardiology

## 2013-04-28 ENCOUNTER — Encounter: Payer: Self-pay | Admitting: Cardiology

## 2013-04-28 VITALS — BP 118/64 | HR 92 | Ht 69.0 in | Wt 195.0 lb

## 2013-04-28 DIAGNOSIS — I4891 Unspecified atrial fibrillation: Secondary | ICD-10-CM

## 2013-04-28 DIAGNOSIS — Z8679 Personal history of other diseases of the circulatory system: Secondary | ICD-10-CM

## 2013-04-28 DIAGNOSIS — R2681 Unsteadiness on feet: Secondary | ICD-10-CM

## 2013-04-28 DIAGNOSIS — R269 Unspecified abnormalities of gait and mobility: Secondary | ICD-10-CM

## 2013-04-28 DIAGNOSIS — G4733 Obstructive sleep apnea (adult) (pediatric): Secondary | ICD-10-CM | POA: Insufficient documentation

## 2013-04-28 DIAGNOSIS — I4892 Unspecified atrial flutter: Secondary | ICD-10-CM

## 2013-04-28 LAB — POCT INR: INR: 2.2

## 2013-04-28 NOTE — Assessment & Plan Note (Signed)
Atrial flutter rate is controlled. He is on Coumadin. No change in therapy. 

## 2013-04-28 NOTE — Assessment & Plan Note (Signed)
He's not having any significant symptoms from orthostatic hypotension. No change in therapy.

## 2013-04-28 NOTE — Patient Instructions (Signed)
Your physician recommends that you continue on your current medications as directed. Please refer to the Current Medication list given to you today.  Your physician wants you to follow-up in: 6 months. You will receive a reminder letter in the mail two months in advance. If you don't receive a letter, please call our office to schedule the follow-up appointment.  

## 2013-04-28 NOTE — Assessment & Plan Note (Signed)
He is actually doing well in this regard.

## 2013-04-28 NOTE — Assessment & Plan Note (Signed)
The patient has been diagnosed with significant obstructive sleep apnea. He is wearing CPAP and he is doing well with that.

## 2013-04-28 NOTE — Progress Notes (Signed)
HPI  Patient returns for followup of atrial fibrillation. He's doing very well. I saw him last November, 2013. After that time I had reviewed Doppler screening that he had at [facility]. I recorded the data in the problem list. Carotid screening revealed some plaque but no stenosis. There was no abnormal aortic aneurysm. ABIs were reasonable.  He is exercising and has lost some weight and is doing well. He has no symptoms.  Allergies  Allergen Reactions  . Mycinettes     Current Outpatient Prescriptions  Medication Sig Dispense Refill  . Bimatoprost (LUMIGAN OP) Apply 1 drop to eye daily.        . Calcium-Vitamin D 600-200 MG-UNIT per tablet Take 1 tablet by mouth daily.        [name] co-enzyme Q-10 30 MG capsule Take 30 mg by mouth daily.        . digoxin (LANOXIN) 0.125 MG tablet Take 1 tablet (0.125 mg total) by mouth daily.  90 tablet  1  . fish oil-omega-3 fatty acids 1000 MG capsule Take 2 g by mouth 2 (two) times daily.        . Gelatin Capsules, Empty, CAPS 4 capsules by Does not apply route 2 (two) times daily.        . Glucosamine 500 MG CAPS Take 1 capsule by mouth daily.        [name] GRAPE SEED CR PO Take 1 capsule by mouth 2 (two) times daily.        [name] levothyroxine (SYNTHROID, LEVOTHROID) 50 MCG tablet Take 50 mcg by mouth daily.        . metFORMIN (GLUCOPHAGE) 500 MG tablet Take 500 mg by mouth daily.        . Multiple Vitamin (MULTIVITAMIN) tablet Take 1 tablet by mouth daily.        [name] POTASSIUM PO Take 1 tablet by mouth daily.        . simvastatin (ZOCOR) 40 MG tablet Take 0.5 tablets (20 mg total) by mouth at bedtime.  30 tablet  6  . verapamil (CALAN-SR) 240 MG CR tablet TAKE ONE TABLET BY MOUTH TWICE DAILY  180 tablet  0  . vitamin B-12 (CYANOCOBALAMIN) 500 MCG tablet Take 500 mcg by mouth daily.        . vitamin C (ASCORBIC ACID) 500 MG tablet Take 500 mg by mouth daily.        . vitamin E 600 UNIT capsule Take 600 Units by mouth daily.        [name] warfarin  (COUMADIN) 5 MG tablet TAKE AS DIRECTED BY ANTICOAGULATION CLINIC  90 tablet  0   No current facility-administered medications for this visit.    History   Social History  . Marital Status: Married    Spouse Name: N/A    Number of Children: N/A  . Years of Education: N/A   Occupational History  . Not on file.   Social History Main Topics  . Smoking status: Current Some Day Smoker    Types: Cigars  . Smokeless tobacco: Not on file  . Alcohol Use: No  . Drug Use: No  . Sexually Active: Not on file   Other Topics Concern  . Not on file   Social History Narrative   Married, retired, gets regular exercise.     No family history on file.  Past Medical History  Diagnosis Date  . Hypertrophic obstructive cardiomyopathy     mild SAM of the mitral valve,  but no LVOT obstrcution. echo 11/09 EF 55%. [place] [facility] -cho-11/11. mild regurgitation - echo - ``/09  . Head injury     when slipped on ice 2004-2005. stablaized and back on Coumadin   . Hypercholesterolemia     treated.   . Atrial fibrillation     and aflutter. pt has a left atrial circuit that is not ablated. was on amiodarone-stopped, now use rate control.   . Elevated liver enzymes     over time; hx  . Bladder cancer     hx; treated with BCG in past   . Diabetes mellitus     type not specified  . TSH elevation     on synthroid historically   . Homocystinemia     elevated, mild   . Glaucoma   . Gout   . Kidney mass     laproscopic surgery woth cryoablation of a mass outside kidney followed at [facility].   [name] Atherosclerosis of aorta     by CT scan in past  . Hypotension     orthostatic. dehydration. hospitalized 11/11  . Stroke     "2 old strokes" CT and MRI. [place] [facility] 11/11. diagnosis was dehydration, no acutal reports.   Waymon Budge gait     August, 2012  . Sleep apnea     Significant obstructive sleep apnea diagnosed in August, 2012, the patient is to receive CPAP   . Excessive sweating   .  Nausea   . Carotid artery disease     There was calcified plaque but no stenosis by carotid artery screening done at [facility] October, 2013    History reviewed. No pertinent past surgical history.  Patient Active Problem List   Diagnosis Date Noted  . Carotid artery disease   . Head injury   . Elevated liver enzymes   . TSH elevation   . Kidney mass   . Atherosclerosis of aorta   . Hypotension   . Stroke   . Unsteady gait   . Sleep apnea   . Excessive sweating   . Nausea   . ORTHOSTATIC HYPOTENSION, HX OF 09/01/2010  . OVERWEIGHT 03/07/2009  . AODM 03/05/2009  . HYPERLIPIDEMIA 03/05/2009  . MITRAL REGURGITATION 03/05/2009  . HOCM 03/05/2009  . ATRIAL FIBRILLATION 03/05/2009  . ATRIAL FLUTTER 03/05/2009    ROS   Patient denies fever, chills, headache, sweats, rash, change in vision, change in hearing, chest pain, cough, nausea vomiting, urinary symptoms. All other systems are reviewed and are negative.  PHYSICAL EXAM   Patient's here with his wife. He is oriented to person time and place. Affect is normal. No jugular venous distention. Lungs are clear. Respiratory effort is nonlabored. Cardiac exam her vitals S1 and S2. The rhythm is irregularly irregular. The rate is controlled. The abdomen is soft. Is no peripheral edema.  Filed Vitals:   04/28/13 0957  BP: 118/64  Pulse: 92  Height: 5\' 9"  (1.753 m)  Weight: 195 lb (88.451 kg)   EKG is done today and reviewed by me. There is old and a ventricular conduction delay and old decreased R wave in V1. There is a liter fibrillation and the rate is controlled.  ASSESSMENT & PLAN

## 2013-06-16 ENCOUNTER — Ambulatory Visit (INDEPENDENT_AMBULATORY_CARE_PROVIDER_SITE_OTHER): Payer: Medicare Other | Admitting: *Deleted

## 2013-06-16 DIAGNOSIS — I4892 Unspecified atrial flutter: Secondary | ICD-10-CM

## 2013-06-16 DIAGNOSIS — I4891 Unspecified atrial fibrillation: Secondary | ICD-10-CM

## 2013-06-16 LAB — POCT INR: INR: 1.5

## 2013-07-06 ENCOUNTER — Ambulatory Visit (INDEPENDENT_AMBULATORY_CARE_PROVIDER_SITE_OTHER): Payer: Medicare Other | Admitting: *Deleted

## 2013-07-06 DIAGNOSIS — I4891 Unspecified atrial fibrillation: Secondary | ICD-10-CM

## 2013-07-06 DIAGNOSIS — I4892 Unspecified atrial flutter: Secondary | ICD-10-CM

## 2013-07-06 LAB — POCT INR: INR: 2.3

## 2013-07-31 ENCOUNTER — Ambulatory Visit (INDEPENDENT_AMBULATORY_CARE_PROVIDER_SITE_OTHER): Payer: Medicare Other | Admitting: Pharmacist

## 2013-07-31 DIAGNOSIS — I4892 Unspecified atrial flutter: Secondary | ICD-10-CM

## 2013-07-31 DIAGNOSIS — I4891 Unspecified atrial fibrillation: Secondary | ICD-10-CM

## 2013-08-05 ENCOUNTER — Other Ambulatory Visit: Payer: Self-pay | Admitting: Cardiology

## 2013-08-17 ENCOUNTER — Ambulatory Visit: Payer: Self-pay | Admitting: Family Medicine

## 2013-08-31 ENCOUNTER — Encounter (INDEPENDENT_AMBULATORY_CARE_PROVIDER_SITE_OTHER): Payer: Self-pay

## 2013-08-31 ENCOUNTER — Telehealth: Payer: Self-pay | Admitting: Cardiology

## 2013-08-31 ENCOUNTER — Ambulatory Visit (INDEPENDENT_AMBULATORY_CARE_PROVIDER_SITE_OTHER): Payer: Medicare Other | Admitting: *Deleted

## 2013-08-31 DIAGNOSIS — I4892 Unspecified atrial flutter: Secondary | ICD-10-CM

## 2013-08-31 DIAGNOSIS — I4891 Unspecified atrial fibrillation: Secondary | ICD-10-CM

## 2013-08-31 LAB — POCT INR: INR: 1.8

## 2013-08-31 NOTE — Telephone Encounter (Signed)
Walk in pt Form " pt Dropped off " Vascualar Report" For Dr.katz review Larita Fife back on Tuesday  Will Hold Onto until she comes back 08/31/13/KM

## 2013-09-16 ENCOUNTER — Other Ambulatory Visit: Payer: Self-pay | Admitting: Cardiology

## 2013-09-21 ENCOUNTER — Ambulatory Visit (INDEPENDENT_AMBULATORY_CARE_PROVIDER_SITE_OTHER): Payer: Medicare Other | Admitting: Pharmacist

## 2013-09-21 DIAGNOSIS — I4891 Unspecified atrial fibrillation: Secondary | ICD-10-CM

## 2013-09-21 DIAGNOSIS — I4892 Unspecified atrial flutter: Secondary | ICD-10-CM

## 2013-09-21 LAB — POCT INR: INR: 2.2

## 2013-09-30 ENCOUNTER — Other Ambulatory Visit: Payer: Self-pay | Admitting: Cardiology

## 2013-10-16 ENCOUNTER — Ambulatory Visit (INDEPENDENT_AMBULATORY_CARE_PROVIDER_SITE_OTHER): Payer: Medicare Other | Admitting: Pharmacist

## 2013-10-16 DIAGNOSIS — I4891 Unspecified atrial fibrillation: Secondary | ICD-10-CM

## 2013-10-16 DIAGNOSIS — I4892 Unspecified atrial flutter: Secondary | ICD-10-CM

## 2013-10-16 LAB — POCT INR: INR: 2.1

## 2013-11-16 ENCOUNTER — Encounter: Payer: Self-pay | Admitting: Cardiology

## 2013-11-16 ENCOUNTER — Ambulatory Visit (INDEPENDENT_AMBULATORY_CARE_PROVIDER_SITE_OTHER): Payer: Medicare Other | Admitting: Cardiology

## 2013-11-16 ENCOUNTER — Ambulatory Visit (INDEPENDENT_AMBULATORY_CARE_PROVIDER_SITE_OTHER): Payer: Medicare Other | Admitting: *Deleted

## 2013-11-16 VITALS — BP 128/64 | HR 92 | Ht 69.0 in | Wt 196.0 lb

## 2013-11-16 DIAGNOSIS — I421 Obstructive hypertrophic cardiomyopathy: Secondary | ICD-10-CM

## 2013-11-16 DIAGNOSIS — I4892 Unspecified atrial flutter: Secondary | ICD-10-CM

## 2013-11-16 DIAGNOSIS — I4891 Unspecified atrial fibrillation: Secondary | ICD-10-CM

## 2013-11-16 DIAGNOSIS — I080 Rheumatic disorders of both mitral and aortic valves: Secondary | ICD-10-CM

## 2013-11-16 DIAGNOSIS — Z5181 Encounter for therapeutic drug level monitoring: Secondary | ICD-10-CM

## 2013-11-16 DIAGNOSIS — I779 Disorder of arteries and arterioles, unspecified: Secondary | ICD-10-CM

## 2013-11-16 DIAGNOSIS — I739 Peripheral vascular disease, unspecified: Secondary | ICD-10-CM

## 2013-11-16 DIAGNOSIS — Z8679 Personal history of other diseases of the circulatory system: Secondary | ICD-10-CM

## 2013-11-16 LAB — POCT INR: INR: 3.1

## 2013-11-16 NOTE — Assessment & Plan Note (Signed)
He does not have any significant symptoms as long as he stands carefully after sitting

## 2013-11-16 NOTE — Progress Notes (Signed)
HPI  Patient is seen today to followup atrial fibrillation. His rate is controlled. He is anticoagulated. He's doing well. He has some mild orthostasis. However when these careful this is not a problem. We know that his carotids have been screened elsewhere. There is no significant stenosis.  Allergies  Allergen Reactions  . Mycinettes     Current Outpatient Prescriptions  Medication Sig Dispense Refill  . Calcium-Vitamin D 600-200 MG-UNIT per tablet Take 1 tablet by mouth daily.        [name] co-enzyme Q-10 30 MG capsule Take 30 mg by mouth daily.        . digoxin (LANOXIN) 0.125 MG tablet TAKE ONE TABLET BY MOUTH ONCE DAILY  90 tablet  0  . fish oil-omega-3 fatty acids 1000 MG capsule Take 2 g by mouth 2 (two) times daily.        . Gelatin Capsules, Empty, CAPS 4 capsules by Does not apply route 2 (two) times daily.        . Glucosamine 500 MG CAPS Take 1 capsule by mouth daily.        [name] GRAPE SEED CR PO Take 1 capsule by mouth 2 (two) times daily.        [name] latanoprost (XALATAN) 0.005 % ophthalmic solution       . levothyroxine (SYNTHROID, LEVOTHROID) 50 MCG tablet Take 50 mcg by mouth daily.        . metFORMIN (GLUCOPHAGE) 500 MG tablet Take 500 mg by mouth daily.        . Multiple Vitamin (MULTIVITAMIN) tablet Take 1 tablet by mouth daily.        . ONE TOUCH ULTRA TEST test strip       . ONETOUCH DELICA LANCETS 76P MISC       . POTASSIUM PO Take 1 tablet by mouth daily.        . simvastatin (ZOCOR) 40 MG tablet Take 0.5 tablets (20 mg total) by mouth at bedtime.  30 tablet  6  . verapamil (CALAN-SR) 240 MG CR tablet TAKE ONE TABLET BY MOUTH TWICE DAILY *NEEDS TO SCHEDULE A FOLLOW UP VISIT*  180 tablet  0  . vitamin B-12 (CYANOCOBALAMIN) 500 MCG tablet Take 500 mcg by mouth daily.        . vitamin C (ASCORBIC ACID) 500 MG tablet Take 500 mg by mouth daily.        . vitamin E 600 UNIT capsule Take 600 Units by mouth daily.        [name] warfarin (COUMADIN) 5 MG tablet TAKE AS DIRECTED BY  ANTICOAGULATION CLINIC  90 tablet  1   No current facility-administered medications for this visit.    History   Social History  . Marital Status: Married    Spouse Name: N/A    Number of Children: N/A  . Years of Education: N/A   Occupational History  . Not on file.   Social History Main Topics  . Smoking status: Current Some Day Smoker    Types: Cigars  . Smokeless tobacco: Not on file  . Alcohol Use: No  . Drug Use: No  . Sexual Activity: Not on file   Other Topics Concern  . Not on file   Social History Narrative   Married, retired, gets regular exercise.     No family history on file.  Past Medical History  Diagnosis Date  . Hypertrophic obstructive cardiomyopathy     mild SAM of the mitral valve, but  no LVOT obstrcution. echo 11/09 EF 55%. [place] [facility] -cho-11/11. mild regurgitation - echo - ``/09  . Head injury     when slipped on ice 2004-2005. stablaized and back on Coumadin   . Hypercholesterolemia     treated.   . Atrial fibrillation     and aflutter. pt has a left atrial circuit that is not ablated. was on amiodarone-stopped, now use rate control.   . Elevated liver enzymes     over time; hx  . Bladder cancer     hx; treated with BCG in past   . Diabetes mellitus     type not specified  . TSH elevation     on synthroid historically   . Homocystinemia     elevated, mild   . Glaucoma   . Gout   . Kidney mass     laproscopic surgery woth cryoablation of a mass outside kidney followed at [facility].   [name] Atherosclerosis of aorta     by CT scan in past  . Hypotension     orthostatic. dehydration. hospitalized 11/11  . Stroke     "2 old strokes" CT and MRI. [place] [facility] 11/11. diagnosis was dehydration, no acutal reports.   Kerman Passey gait     August, 2012  . Sleep apnea     Significant obstructive sleep apnea diagnosed in August, 2012, the patient is to receive CPAP   . Excessive sweating   . Nausea   . Carotid artery disease     There  was calcified plaque but no stenosis by carotid artery screening done at [facility] October, 2013  . Obstructive sleep apnea     CPAP started successfully 2014    History reviewed. No pertinent past surgical history.  Patient Active Problem List   Diagnosis Date Noted  . Obstructive sleep apnea   . Carotid artery disease   . Head injury   . Elevated liver enzymes   . TSH elevation   . Kidney mass   . Atherosclerosis of aorta   . Hypotension   . Stroke   . Unsteady gait   . Sleep apnea   . Excessive sweating   . Nausea   . ORTHOSTATIC HYPOTENSION, HX OF 09/01/2010  . OVERWEIGHT 03/07/2009  . AODM 03/05/2009  . HYPERLIPIDEMIA 03/05/2009  . MITRAL REGURGITATION 03/05/2009  . HOCM 03/05/2009  . ATRIAL FIBRILLATION 03/05/2009  . ATRIAL FLUTTER 03/05/2009    ROS   Patient denies fever, chills, headache, sweats, rash, change in vision, change in hearing, chest pain, cough, nausea vomiting, urinary symptoms. All other systems are reviewed and are negative.  PHYSICAL EXAM  Patient is here with his wife. He is oriented to person time and place. Affect is normal. There is no jugulovenous distention. Lungs are clear. Respiratory effort is nonlabored. Cardiac exam reveals S1 and S2.  There is a systolic murmur.. The abdomen is soft. There is no peripheral edema.  Filed Vitals:   11/16/13 0942  BP: 128/64  Pulse: 92  Height: 5\' 9"  (1.753 m)  Weight: 196 lb (88.905 kg)    ASSESSMENT & PLAN

## 2013-11-16 NOTE — Assessment & Plan Note (Signed)
In the past we have used the term hypertrophic obstructive cardiomyopathy. However he is actually never had significant obstruction. It is now time to reassess with an echo.

## 2013-11-16 NOTE — Patient Instructions (Signed)

## 2013-11-16 NOTE — Assessment & Plan Note (Signed)
Carotid artery disease is stable. No further workup.

## 2013-11-16 NOTE — Assessment & Plan Note (Signed)
There is a history of mild mitral regurgitation. The patient has not had an echo for greater than 5 years. Two-dimensional echo will be scheduled.

## 2013-11-16 NOTE — Assessment & Plan Note (Signed)
Chronic atrial fibrillation rate is controlled. No change in therapy.

## 2013-11-17 ENCOUNTER — Ambulatory Visit (HOSPITAL_COMMUNITY): Payer: Medicare Other | Attending: Cardiology | Admitting: Radiology

## 2013-11-17 DIAGNOSIS — R011 Cardiac murmur, unspecified: Secondary | ICD-10-CM | POA: Insufficient documentation

## 2013-11-17 DIAGNOSIS — I4892 Unspecified atrial flutter: Secondary | ICD-10-CM | POA: Insufficient documentation

## 2013-11-17 DIAGNOSIS — C639 Malignant neoplasm of male genital organ, unspecified: Secondary | ICD-10-CM | POA: Insufficient documentation

## 2013-11-17 DIAGNOSIS — I059 Rheumatic mitral valve disease, unspecified: Secondary | ICD-10-CM | POA: Insufficient documentation

## 2013-11-17 DIAGNOSIS — I359 Nonrheumatic aortic valve disorder, unspecified: Secondary | ICD-10-CM | POA: Insufficient documentation

## 2013-11-17 DIAGNOSIS — I421 Obstructive hypertrophic cardiomyopathy: Secondary | ICD-10-CM

## 2013-11-17 DIAGNOSIS — F17200 Nicotine dependence, unspecified, uncomplicated: Secondary | ICD-10-CM | POA: Insufficient documentation

## 2013-11-17 DIAGNOSIS — I080 Rheumatic disorders of both mitral and aortic valves: Secondary | ICD-10-CM

## 2013-11-17 DIAGNOSIS — I4891 Unspecified atrial fibrillation: Secondary | ICD-10-CM

## 2013-11-17 DIAGNOSIS — E119 Type 2 diabetes mellitus without complications: Secondary | ICD-10-CM | POA: Insufficient documentation

## 2013-11-17 DIAGNOSIS — I079 Rheumatic tricuspid valve disease, unspecified: Secondary | ICD-10-CM | POA: Insufficient documentation

## 2013-11-17 NOTE — Progress Notes (Signed)
Echocardiogram performed.  

## 2013-11-20 ENCOUNTER — Encounter: Payer: Self-pay | Admitting: Cardiology

## 2013-11-20 DIAGNOSIS — R9430 Abnormal result of cardiovascular function study, unspecified: Secondary | ICD-10-CM | POA: Insufficient documentation

## 2013-11-20 DIAGNOSIS — I421 Obstructive hypertrophic cardiomyopathy: Secondary | ICD-10-CM | POA: Insufficient documentation

## 2013-11-24 NOTE — Progress Notes (Signed)
Quick Note:  LMTCB 2/10 - PE ______

## 2013-12-04 ENCOUNTER — Ambulatory Visit (INDEPENDENT_AMBULATORY_CARE_PROVIDER_SITE_OTHER): Payer: Medicare Other | Admitting: *Deleted

## 2013-12-04 DIAGNOSIS — I4891 Unspecified atrial fibrillation: Secondary | ICD-10-CM

## 2013-12-04 DIAGNOSIS — Z5181 Encounter for therapeutic drug level monitoring: Secondary | ICD-10-CM

## 2013-12-04 DIAGNOSIS — I4892 Unspecified atrial flutter: Secondary | ICD-10-CM

## 2013-12-04 LAB — POCT INR: INR: 2.6

## 2013-12-26 ENCOUNTER — Other Ambulatory Visit: Payer: Self-pay | Admitting: Cardiology

## 2013-12-31 ENCOUNTER — Ambulatory Visit (INDEPENDENT_AMBULATORY_CARE_PROVIDER_SITE_OTHER): Payer: Medicare Other | Admitting: *Deleted

## 2013-12-31 DIAGNOSIS — Z5181 Encounter for therapeutic drug level monitoring: Secondary | ICD-10-CM

## 2013-12-31 DIAGNOSIS — I4891 Unspecified atrial fibrillation: Secondary | ICD-10-CM

## 2013-12-31 DIAGNOSIS — I4892 Unspecified atrial flutter: Secondary | ICD-10-CM

## 2013-12-31 LAB — POCT INR: INR: 2.2

## 2014-01-28 ENCOUNTER — Ambulatory Visit (INDEPENDENT_AMBULATORY_CARE_PROVIDER_SITE_OTHER): Payer: Medicare Other | Admitting: Pharmacist Clinician (PhC)/ Clinical Pharmacy Specialist

## 2014-01-28 DIAGNOSIS — I4892 Unspecified atrial flutter: Secondary | ICD-10-CM

## 2014-01-28 DIAGNOSIS — I4891 Unspecified atrial fibrillation: Secondary | ICD-10-CM

## 2014-01-28 DIAGNOSIS — Z5181 Encounter for therapeutic drug level monitoring: Secondary | ICD-10-CM

## 2014-01-28 LAB — POCT INR: INR: 2.6

## 2014-03-01 ENCOUNTER — Ambulatory Visit (INDEPENDENT_AMBULATORY_CARE_PROVIDER_SITE_OTHER): Payer: Medicare Other | Admitting: *Deleted

## 2014-03-01 DIAGNOSIS — I4892 Unspecified atrial flutter: Secondary | ICD-10-CM

## 2014-03-01 DIAGNOSIS — I4891 Unspecified atrial fibrillation: Secondary | ICD-10-CM

## 2014-03-01 DIAGNOSIS — Z5181 Encounter for therapeutic drug level monitoring: Secondary | ICD-10-CM

## 2014-03-01 LAB — POCT INR: INR: 2.8

## 2014-03-08 ENCOUNTER — Other Ambulatory Visit: Payer: Self-pay | Admitting: Cardiology

## 2014-03-22 ENCOUNTER — Ambulatory Visit (INDEPENDENT_AMBULATORY_CARE_PROVIDER_SITE_OTHER): Payer: Medicare Other

## 2014-03-22 ENCOUNTER — Other Ambulatory Visit: Payer: Self-pay | Admitting: Cardiology

## 2014-03-22 DIAGNOSIS — Z5181 Encounter for therapeutic drug level monitoring: Secondary | ICD-10-CM

## 2014-03-22 DIAGNOSIS — I4892 Unspecified atrial flutter: Secondary | ICD-10-CM

## 2014-03-22 DIAGNOSIS — I4891 Unspecified atrial fibrillation: Secondary | ICD-10-CM

## 2014-03-22 LAB — POCT INR: INR: 2.5

## 2014-04-08 ENCOUNTER — Other Ambulatory Visit: Payer: Self-pay | Admitting: Cardiology

## 2014-04-19 ENCOUNTER — Ambulatory Visit (INDEPENDENT_AMBULATORY_CARE_PROVIDER_SITE_OTHER): Payer: Medicare Other | Admitting: Pharmacist

## 2014-04-19 DIAGNOSIS — I4891 Unspecified atrial fibrillation: Secondary | ICD-10-CM

## 2014-04-19 DIAGNOSIS — Z5181 Encounter for therapeutic drug level monitoring: Secondary | ICD-10-CM

## 2014-04-19 DIAGNOSIS — I4892 Unspecified atrial flutter: Secondary | ICD-10-CM

## 2014-04-19 LAB — POCT INR: INR: 2.4

## 2014-05-21 ENCOUNTER — Ambulatory Visit (INDEPENDENT_AMBULATORY_CARE_PROVIDER_SITE_OTHER): Payer: Medicare Other | Admitting: Pharmacist

## 2014-05-21 ENCOUNTER — Encounter: Payer: Self-pay | Admitting: Cardiology

## 2014-05-21 ENCOUNTER — Ambulatory Visit (INDEPENDENT_AMBULATORY_CARE_PROVIDER_SITE_OTHER): Payer: Medicare Other | Admitting: Cardiology

## 2014-05-21 VITALS — BP 127/68 | HR 87 | Ht 69.0 in | Wt 192.8 lb

## 2014-05-21 DIAGNOSIS — I4891 Unspecified atrial fibrillation: Secondary | ICD-10-CM

## 2014-05-21 DIAGNOSIS — I482 Chronic atrial fibrillation, unspecified: Secondary | ICD-10-CM

## 2014-05-21 DIAGNOSIS — Z5181 Encounter for therapeutic drug level monitoring: Secondary | ICD-10-CM

## 2014-05-21 DIAGNOSIS — I421 Obstructive hypertrophic cardiomyopathy: Secondary | ICD-10-CM

## 2014-05-21 DIAGNOSIS — Z8679 Personal history of other diseases of the circulatory system: Secondary | ICD-10-CM

## 2014-05-21 DIAGNOSIS — I4892 Unspecified atrial flutter: Secondary | ICD-10-CM

## 2014-05-21 LAB — POCT INR: INR: 2.6

## 2014-05-21 NOTE — Assessment & Plan Note (Signed)
The patient has hypertrophic disease. However he is stable. He does not need a followup echo at this time.

## 2014-05-21 NOTE — Assessment & Plan Note (Signed)
He has not had any significant orthostatic symptoms. No change in therapy.

## 2014-05-21 NOTE — Assessment & Plan Note (Signed)
Atrial fibrillation is controlled. No change in therapy. Anticoagulation was continued.

## 2014-05-21 NOTE — Progress Notes (Signed)
Patient ID: [name] [name], male   DOB: 1934/04/04, 80 y.o.   MRN: [id number]    HPI  Patient is seen for cardiology followup of his atrial fibrillation. He is stable. He is anticoagulated. His rate is adequately controlled.  Allergies  Allergen Reactions  . Erythromycin     Other reaction(s): UNKNOWN  . Mycinettes   . Nitrofuran Derivatives Other (See Comments)    Current Outpatient Prescriptions  Medication Sig Dispense Refill  . BETA CAROTENE PO Take 1 capsule by mouth daily.      [name] BIMATOPROST OP Apply 1 drop to eye every evening.      . Calcium-Vitamin D 600-200 MG-UNIT per tablet Take 1 tablet by mouth daily.        [name] co-enzyme Q-10 30 MG capsule Take 30 mg by mouth daily.        . digoxin (LANOXIN) 0.125 MG tablet TAKE ONE TABLET BY MOUTH ONCE DAILY  90 tablet  0  . fish oil-omega-3 fatty acids 1000 MG capsule Take 2 g by mouth 2 (two) times daily.        . Gelatin Capsules, Empty, CAPS 4 capsules by Does not apply route 2 (two) times daily.        . Glucosamine 500 MG CAPS Take 1 capsule by mouth daily.        [name] GRAPE SEED CR PO Take 1 capsule by mouth 2 (two) times daily.        [name] latanoprost (XALATAN) 0.005 % ophthalmic solution Place 1 drop into both eyes at bedtime.       [name] levothyroxine (SYNTHROID, LEVOTHROID) 50 MCG tablet Take 50 mcg by mouth daily.        . metFORMIN (GLUCOPHAGE) 500 MG tablet Take 500 mg by mouth daily.        . Multiple Vitamin (MULTIVITAMIN) tablet Take 1 tablet by mouth daily.        [name] POTASSIUM PO Take 1 tablet by mouth daily.        . simvastatin (ZOCOR) 40 MG tablet TAKE ONE-HALF TABLET BY MOUTH AT BEDTIME  45 tablet  1  . verapamil (CALAN-SR) 240 MG CR tablet Take 1 tablet (240 mg total) by mouth daily.  180 tablet  1  . vitamin B-12 (CYANOCOBALAMIN) 500 MCG tablet Take 500 mcg by mouth daily.        . vitamin C (ASCORBIC ACID) 500 MG tablet Take 500 mg by mouth daily.        . vitamin E 600 UNIT capsule Take 600 Units by mouth daily.         [name] warfarin (COUMADIN) 5 MG tablet 1 pill everyday except 1/2 pill on Sundays and Wednesdays or as directed  90 tablet  1   No current facility-administered medications for this visit.    History   Social History  . Marital Status: Married    Spouse Name: N/A    Number of Children: N/A  . Years of Education: N/A   Occupational History  . Not on file.   Social History Main Topics  . Smoking status: Current Some Day Smoker    Types: Cigars  . Smokeless tobacco: Not on file  . Alcohol Use: No  . Drug Use: No  . Sexual Activity: Not on file   Other Topics Concern  . Not on file   Social History Narrative   Married, retired, gets regular exercise.     No family history on file.  Past  Medical History  Diagnosis Date  . HOCM (hypertrophic obstructive cardiomyopathy)     mild SAM of the mitral valve, but no LVOT obstrcution. echo 11/09 EF 55%. [place] [facility] -cho-11/11. mild regurgitation - echo - ``/09  . Head injury     when slipped on ice 2004-2005. stablaized and back on Coumadin   . Hypercholesterolemia     treated.   . Atrial fibrillation     and aflutter. pt has a left atrial circuit that is not ablated. was on amiodarone-stopped, now use rate control.   . Elevated liver enzymes     over time; hx  . Bladder cancer     hx; treated with BCG in past   . Diabetes mellitus     type not specified  . TSH elevation     on synthroid historically   . Homocystinemia     elevated, mild   . Glaucoma   . Gout   . Kidney mass     laproscopic surgery woth cryoablation of a mass outside kidney followed at [facility].   [name] Atherosclerosis of aorta     by CT scan in past  . Hypotension     orthostatic. dehydration. hospitalized 11/11  . Stroke     "2 old strokes" CT and MRI. [place] [facility] 11/11. diagnosis was dehydration, no acutal reports.   Kerman Passey gait     August, 2012  . Sleep apnea     Significant obstructive sleep apnea diagnosed in August, 2012, the patient is  to receive CPAP   . Excessive sweating   . Nausea   . Carotid artery disease     There was calcified plaque but no stenosis by carotid artery screening done at [facility] October, 2013  . Obstructive sleep apnea     CPAP started successfully 2014  . Ejection fraction     History reviewed. No pertinent past surgical history.  Patient Active Problem List   Diagnosis Date Noted  . Ejection fraction   . HOCM (hypertrophic obstructive cardiomyopathy)   . Encounter for therapeutic drug monitoring 11/16/2013  . Obstructive sleep apnea   . Carotid artery disease   . Head injury   . Elevated liver enzymes   . TSH elevation   . Kidney mass   . Atherosclerosis of aorta   . Hypotension   . Stroke   . Unsteady gait   . Sleep apnea   . Excessive sweating   . Nausea   . ORTHOSTATIC HYPOTENSION, HX OF 09/01/2010  . OVERWEIGHT 03/07/2009  . AODM 03/05/2009  . HYPERLIPIDEMIA 03/05/2009  . MITRAL REGURGITATION 03/05/2009  . ATRIAL FIBRILLATION 03/05/2009  . ATRIAL FLUTTER 03/05/2009    ROS   Patient denies fever, chills, headache, sweats, rash, change in vision, change in hearing, chest pain, cough, nausea or vomiting, urinary symptoms. All other systems are reviewed and are negative.  PHYSICAL EXAM  Patient is oriented to person time and place. Affect is normal. He's here with his wife. Head is atraumatic. He has lost more weight and he looks good. Lungs are clear. Respiratory effort is nonlabored. Cardiac exam reveals an S1. He has a systolic murmur. His rhythm is irregularly irregular. The rate is controlled. Abdomen is soft. There is no peripheral edema.  Filed Vitals:   05/21/14 1040  BP: 127/68  Pulse: 87  Height: 5\' 9"  (1.753 m)  Weight: 192 lb 12.8 oz (87.454 kg)   EKG is done today and reviewed by me. He has diffuse  ST-T wave changes that are old. He has atrial fibrillation that is old with a controlled rate.  ASSESSMENT & PLAN

## 2014-05-21 NOTE — Patient Instructions (Signed)
Your physician recommends that you continue on your current medications as directed. Please refer to the Current Medication list given to you today.  Your physician wants you to follow-up in: 6 months. You will receive a reminder letter in the mail two months in advance. If you don't receive a letter, please call our office to schedule the follow-up appointment.  

## 2014-07-02 ENCOUNTER — Ambulatory Visit (INDEPENDENT_AMBULATORY_CARE_PROVIDER_SITE_OTHER): Payer: Medicare Other | Admitting: Pharmacist

## 2014-07-02 DIAGNOSIS — Z5181 Encounter for therapeutic drug level monitoring: Secondary | ICD-10-CM

## 2014-07-02 DIAGNOSIS — I4891 Unspecified atrial fibrillation: Secondary | ICD-10-CM

## 2014-07-02 DIAGNOSIS — I4892 Unspecified atrial flutter: Secondary | ICD-10-CM

## 2014-07-02 LAB — POCT INR: INR: 2.1

## 2014-07-09 ENCOUNTER — Other Ambulatory Visit: Payer: Self-pay | Admitting: Cardiology

## 2014-07-12 DIAGNOSIS — N2889 Other specified disorders of kidney and ureter: Secondary | ICD-10-CM | POA: Insufficient documentation

## 2014-07-12 DIAGNOSIS — I779 Disorder of arteries and arterioles, unspecified: Secondary | ICD-10-CM | POA: Insufficient documentation

## 2014-07-12 DIAGNOSIS — R0989 Other specified symptoms and signs involving the circulatory and respiratory systems: Secondary | ICD-10-CM | POA: Insufficient documentation

## 2014-07-12 DIAGNOSIS — I421 Obstructive hypertrophic cardiomyopathy: Secondary | ICD-10-CM | POA: Insufficient documentation

## 2014-07-12 DIAGNOSIS — G4733 Obstructive sleep apnea (adult) (pediatric): Secondary | ICD-10-CM | POA: Insufficient documentation

## 2014-07-12 DIAGNOSIS — I639 Cerebral infarction, unspecified: Secondary | ICD-10-CM | POA: Insufficient documentation

## 2014-07-12 DIAGNOSIS — R748 Abnormal levels of other serum enzymes: Secondary | ICD-10-CM | POA: Insufficient documentation

## 2014-07-12 DIAGNOSIS — R7989 Other specified abnormal findings of blood chemistry: Secondary | ICD-10-CM | POA: Insufficient documentation

## 2014-07-12 DIAGNOSIS — I739 Peripheral vascular disease, unspecified: Secondary | ICD-10-CM

## 2014-07-12 DIAGNOSIS — S0990XA Unspecified injury of head, initial encounter: Secondary | ICD-10-CM | POA: Insufficient documentation

## 2014-07-12 DIAGNOSIS — I700 Atherosclerosis of aorta: Secondary | ICD-10-CM | POA: Insufficient documentation

## 2014-08-06 ENCOUNTER — Ambulatory Visit (INDEPENDENT_AMBULATORY_CARE_PROVIDER_SITE_OTHER): Payer: Medicare Other

## 2014-08-06 DIAGNOSIS — I4891 Unspecified atrial fibrillation: Secondary | ICD-10-CM

## 2014-08-06 DIAGNOSIS — I4892 Unspecified atrial flutter: Secondary | ICD-10-CM

## 2014-08-06 DIAGNOSIS — Z5181 Encounter for therapeutic drug level monitoring: Secondary | ICD-10-CM

## 2014-08-06 LAB — POCT INR: INR: 2.2

## 2014-09-17 ENCOUNTER — Ambulatory Visit (INDEPENDENT_AMBULATORY_CARE_PROVIDER_SITE_OTHER): Payer: Medicare Other | Admitting: Pharmacist

## 2014-09-17 DIAGNOSIS — Z5181 Encounter for therapeutic drug level monitoring: Secondary | ICD-10-CM

## 2014-09-17 DIAGNOSIS — I4891 Unspecified atrial fibrillation: Secondary | ICD-10-CM

## 2014-09-17 DIAGNOSIS — I4892 Unspecified atrial flutter: Secondary | ICD-10-CM

## 2014-09-17 LAB — POCT INR: INR: 2.1

## 2014-09-19 ENCOUNTER — Other Ambulatory Visit: Payer: Self-pay | Admitting: Cardiology

## 2014-10-10 ENCOUNTER — Other Ambulatory Visit: Payer: Self-pay | Admitting: Cardiology

## 2014-10-11 ENCOUNTER — Other Ambulatory Visit: Payer: Self-pay

## 2014-10-11 ENCOUNTER — Telehealth: Payer: Self-pay | Admitting: Cardiology

## 2014-10-11 MED ORDER — WARFARIN SODIUM 5 MG PO TABS: ORAL_TABLET | ORAL | Status: DC

## 2014-10-11 NOTE — Telephone Encounter (Signed)
Rx(s) sent to pharmacy electronically.  

## 2014-10-11 NOTE — Telephone Encounter (Signed)
[facility] calling, would like to switch pt prescription from Warfarin to Citron and will need a call to make sure this is ok to do.   May contact [place] at pharmacy at [phone].

## 2014-10-11 NOTE — Telephone Encounter (Signed)
**Note De-Identified  Obfuscation** [name] [name] states that they have already received okay form someone at this office (does not know name of person who gave okay) for the pt to switch to Citron from Warfarin.

## 2014-10-18 ENCOUNTER — Encounter: Payer: Self-pay | Admitting: Cardiology

## 2014-10-29 ENCOUNTER — Ambulatory Visit (INDEPENDENT_AMBULATORY_CARE_PROVIDER_SITE_OTHER): Payer: Medicare Other | Admitting: Pharmacist

## 2014-10-29 DIAGNOSIS — I4892 Unspecified atrial flutter: Secondary | ICD-10-CM | POA: Diagnosis not present

## 2014-10-29 DIAGNOSIS — Z5181 Encounter for therapeutic drug level monitoring: Secondary | ICD-10-CM | POA: Diagnosis not present

## 2014-10-29 DIAGNOSIS — I4891 Unspecified atrial fibrillation: Secondary | ICD-10-CM

## 2014-10-29 LAB — POCT INR: INR: 2.1

## 2014-11-24 DIAGNOSIS — E119 Type 2 diabetes mellitus without complications: Secondary | ICD-10-CM | POA: Diagnosis not present

## 2014-11-24 DIAGNOSIS — B351 Tinea unguium: Secondary | ICD-10-CM | POA: Diagnosis not present

## 2014-11-24 DIAGNOSIS — L851 Acquired keratosis [keratoderma] palmaris et plantaris: Secondary | ICD-10-CM | POA: Diagnosis not present

## 2014-11-30 ENCOUNTER — Other Ambulatory Visit: Payer: Self-pay

## 2014-11-30 MED ORDER — SIMVASTATIN 40 MG PO TABS
20.0000 mg | ORAL_TABLET | Freq: Every day | ORAL | Status: DC
Start: 2014-11-30 — End: 2015-08-26

## 2014-12-13 ENCOUNTER — Encounter: Payer: Self-pay | Admitting: Cardiology

## 2014-12-13 ENCOUNTER — Ambulatory Visit (INDEPENDENT_AMBULATORY_CARE_PROVIDER_SITE_OTHER): Payer: Medicare Other | Admitting: Cardiology

## 2014-12-13 ENCOUNTER — Ambulatory Visit (INDEPENDENT_AMBULATORY_CARE_PROVIDER_SITE_OTHER): Payer: Medicare Other | Admitting: *Deleted

## 2014-12-13 VITALS — BP 118/66 | HR 107 | Ht 69.0 in | Wt 192.0 lb

## 2014-12-13 DIAGNOSIS — Z5181 Encounter for therapeutic drug level monitoring: Secondary | ICD-10-CM

## 2014-12-13 DIAGNOSIS — I482 Chronic atrial fibrillation, unspecified: Secondary | ICD-10-CM

## 2014-12-13 DIAGNOSIS — I4891 Unspecified atrial fibrillation: Secondary | ICD-10-CM

## 2014-12-13 DIAGNOSIS — I421 Obstructive hypertrophic cardiomyopathy: Secondary | ICD-10-CM | POA: Diagnosis not present

## 2014-12-13 DIAGNOSIS — I4892 Unspecified atrial flutter: Secondary | ICD-10-CM

## 2014-12-13 DIAGNOSIS — G250 Essential tremor: Secondary | ICD-10-CM | POA: Insufficient documentation

## 2014-12-13 LAB — POCT INR: INR: 2.3

## 2014-12-13 NOTE — Assessment & Plan Note (Signed)
There is mild aortic stenosis and mild outflow tract obstruction. He does not have symptoms. No further workup.

## 2014-12-13 NOTE — Patient Instructions (Signed)
**Note De-Identified  Obfuscation** Your physician recommends that you continue on your current medications as directed. Please refer to the Current Medication list given to you today.  Your physician wants you to follow-up in: about 7 months. You will receive a reminder letter in the mail two months in advance. If you don't receive a letter, please call our office to schedule the follow-up appointment.

## 2014-12-13 NOTE — Progress Notes (Signed)
Cardiology Office Note   Date:  12/13/2014   ID:  [place], [place] [birth year], MRN [id number]  PCP:  Miguel Aschoff, MD  Cardiologist:  Dola Argyle, MD   Chief Complaint  Patient presents with  . Appointment    Follow-up atrial fibrillation      History of Present Illness: [name] [name] is a 81 y.o. male who presents today to follow up atrial fibrillation. I saw him last August, 2015. He feels fine. His wife has noted a mild tremor of his head. I didn't see it as I'm speaking with him. He thinks that it is not real. He does not have any hand tremor. He is not having any significant palpitations. He has had no syncope or presyncope.    Past Medical History  Diagnosis Date  . HOCM (hypertrophic obstructive cardiomyopathy)     mild SAM of the mitral valve, but no LVOT obstrcution. echo 11/09 EF 55%. [facility] -cho-11/11. mild regurgitation - echo - ``/09  . Head injury     when slipped on ice 2004-2005. stablaized and back on Coumadin   . Hypercholesterolemia     treated.   . Atrial fibrillation     and aflutter. pt has a left atrial circuit that is not ablated. was on amiodarone-stopped, now use rate control.   . Elevated liver enzymes     over time; hx  . Bladder cancer     hx; treated with BCG in past   . Diabetes mellitus     type not specified  . TSH elevation     on synthroid historically   . Homocystinemia     elevated, mild   . Glaucoma   . Gout   . Kidney mass     laproscopic surgery woth cryoablation of a mass outside kidney followed at [facility].   [name] Atherosclerosis of aorta     by CT scan in past  . Hypotension     orthostatic. dehydration. hospitalized 11/11  . Stroke     "2 old strokes" CT and MRI. [place] [facility] 11/11. diagnosis was dehydration, no acutal reports.   Kerman Passey gait     August, 2012  . Sleep apnea     Significant obstructive sleep apnea diagnosed in August, 2012, the patient is to receive CPAP   . Excessive  sweating   . Nausea   . Carotid artery disease     There was calcified plaque but no stenosis by carotid artery screening done at [facility] October, 2013  . Obstructive sleep apnea     CPAP started successfully 2014  . Ejection fraction     History reviewed. No pertinent past surgical history.  Patient Active Problem List   Diagnosis Date Noted  . Benign head tremor 12/13/2014  . Ejection fraction   . HOCM (hypertrophic obstructive cardiomyopathy)   . Encounter for therapeutic drug monitoring 11/16/2013  . Obstructive sleep apnea   . Carotid artery disease   . Head injury   . Elevated liver enzymes   . TSH elevation   . Kidney mass   . Atherosclerosis of aorta   . Hypotension   . Stroke   . Unsteady gait   . Sleep apnea   . Excessive sweating   . Nausea   . ORTHOSTATIC HYPOTENSION, HX OF 09/01/2010  . OVERWEIGHT 03/07/2009  . AODM 03/05/2009  . HYPERLIPIDEMIA 03/05/2009  . MITRAL REGURGITATION 03/05/2009  . ATRIAL FIBRILLATION 03/05/2009  . ATRIAL FLUTTER 03/05/2009  Current Outpatient Prescriptions  Medication Sig Dispense Refill  . BETA CAROTENE PO Take 1 capsule by mouth daily.    [name] BIMATOPROST OP Apply 1 drop to eye every evening.    . Calcium-Vitamin D 600-200 MG-UNIT per tablet Take 1 tablet by mouth daily.      [name] co-enzyme Q-10 30 MG capsule Take 30 mg by mouth daily.      . digoxin (LANOXIN) 0.125 MG tablet TAKE ONE TABLET BY MOUTH ONCE DAILY 90 tablet 1  . fish oil-omega-3 fatty acids 1000 MG capsule Take 2 g by mouth 2 (two) times daily.      . Gelatin Capsules, Empty, CAPS 4 capsules by Does not apply route 2 (two) times daily.      . Glucosamine 500 MG CAPS Take 1 capsule by mouth daily.      [name] latanoprost (XALATAN) 0.005 % ophthalmic solution Place 1 drop into both eyes at bedtime.     [name] levothyroxine (SYNTHROID, LEVOTHROID) 50 MCG tablet Take 50 mcg by mouth daily.      . metFORMIN (GLUCOPHAGE) 500 MG tablet Take 500 mg by mouth  daily.      . Multiple Vitamin (MULTIVITAMIN) tablet Take 1 tablet by mouth daily.      [name] POTASSIUM PO Take 1 tablet by mouth daily.      . simvastatin (ZOCOR) 40 MG tablet Take 0.5 tablets (20 mg total) by mouth at bedtime. 45 tablet 3  . verapamil (CALAN-SR) 240 MG CR tablet Take 1 tablet (240 mg total) by mouth daily. 180 tablet 1  . vitamin B-12 (CYANOCOBALAMIN) 500 MCG tablet Take 500 mcg by mouth daily.      . vitamin C (ASCORBIC ACID) 500 MG tablet Take 500 mg by mouth daily.      . vitamin E 600 UNIT capsule Take 600 Units by mouth daily.      [name] warfarin (COUMADIN) 5 MG tablet 1 pill everyday except 1/2 pill on Sundays and Wednesdays or as directed 90 tablet 1   No current facility-administered medications for this visit.    Allergies:   Erythromycin; Mycinettes; and Nitrofuran derivatives    Social History:  The patient  reports that he has been smoking Cigars.  He does not have any smokeless tobacco history on file. He reports that he does not drink alcohol or use illicit drugs.   Family History:  There is no significant family history of coronary disease or atrial fibrillation.   ROS:  Please see the history of present illness.      Patient denies fever, chills, headache, sweats, rash, change in vision, change in hearing, chest pain, cough, nausea or vomiting, urinary symptoms. All other systems are reviewed and are negative.   PHYSICAL EXAM: VS:  BP 118/66 mmHg  Pulse 107  Ht 5\' 9"  (1.753 m)  Wt 192 lb (87.091 kg)  BMI 28.34 kg/m2 , Patient is oriented to person, place and affect is normal. He is here with his wife. He does have a mild intermittent tremor of his head. There is no tremor seen elsewhere. Head is atraumatic. Sclera and conjunctiva are normal. There is no jugulovenous distention. Lungs are clear. Respiratory effort is nonlabored. Cardiac exam reveals S1 and S2. There is a systolic murmur. The abdomen is soft. There is no peripheral edema. There are no  musculoskeletal deformities. There are no skin rashes.  EKG:   EKG is not done today.   Recent Labs: No results found for requested labs within  last 365 days.    Lipid Panel No results found for: CHOL, TRIG, HDL, CHOLHDL, VLDL, LDLCALC, LDLDIRECT    Wt Readings from Last 3 Encounters:  12/13/14 192 lb (87.091 kg)  05/21/14 192 lb 12.8 oz (87.454 kg)  11/16/13 196 lb (88.905 kg)      Current medicines are reviewed  The patient has a very good understanding of his medications.     ASSESSMENT AND PLAN:

## 2014-12-13 NOTE — Assessment & Plan Note (Signed)
He appears to have a mild head tremor. This is new for him. His wife is well aware. The patient cannot sense it. He will follow-up with his primary physician.

## 2014-12-13 NOTE — Assessment & Plan Note (Signed)
He is anticoagulated. His rate is controlled. No further workup.

## 2014-12-22 DIAGNOSIS — H4011X1 Primary open-angle glaucoma, mild stage: Secondary | ICD-10-CM | POA: Diagnosis not present

## 2014-12-28 DIAGNOSIS — Z111 Encounter for screening for respiratory tuberculosis: Secondary | ICD-10-CM | POA: Diagnosis not present

## 2014-12-28 DIAGNOSIS — H4011X1 Primary open-angle glaucoma, mild stage: Secondary | ICD-10-CM | POA: Diagnosis not present

## 2015-01-18 DIAGNOSIS — Z872 Personal history of diseases of the skin and subcutaneous tissue: Secondary | ICD-10-CM | POA: Diagnosis not present

## 2015-01-18 DIAGNOSIS — L821 Other seborrheic keratosis: Secondary | ICD-10-CM | POA: Diagnosis not present

## 2015-01-18 DIAGNOSIS — Z1283 Encounter for screening for malignant neoplasm of skin: Secondary | ICD-10-CM | POA: Diagnosis not present

## 2015-01-18 DIAGNOSIS — L918 Other hypertrophic disorders of the skin: Secondary | ICD-10-CM | POA: Diagnosis not present

## 2015-01-18 DIAGNOSIS — L570 Actinic keratosis: Secondary | ICD-10-CM | POA: Diagnosis not present

## 2015-01-24 ENCOUNTER — Ambulatory Visit (INDEPENDENT_AMBULATORY_CARE_PROVIDER_SITE_OTHER): Payer: Medicare Other | Admitting: *Deleted

## 2015-01-24 DIAGNOSIS — I482 Chronic atrial fibrillation, unspecified: Secondary | ICD-10-CM

## 2015-01-24 DIAGNOSIS — Z5181 Encounter for therapeutic drug level monitoring: Secondary | ICD-10-CM | POA: Diagnosis not present

## 2015-01-24 DIAGNOSIS — I4891 Unspecified atrial fibrillation: Secondary | ICD-10-CM

## 2015-01-24 DIAGNOSIS — I4892 Unspecified atrial flutter: Secondary | ICD-10-CM

## 2015-01-24 LAB — POCT INR: INR: 4.6

## 2015-02-07 ENCOUNTER — Ambulatory Visit (INDEPENDENT_AMBULATORY_CARE_PROVIDER_SITE_OTHER): Payer: Medicare Other | Admitting: *Deleted

## 2015-02-07 DIAGNOSIS — I10 Essential (primary) hypertension: Secondary | ICD-10-CM | POA: Diagnosis not present

## 2015-02-07 DIAGNOSIS — I4892 Unspecified atrial flutter: Secondary | ICD-10-CM

## 2015-02-07 DIAGNOSIS — E119 Type 2 diabetes mellitus without complications: Secondary | ICD-10-CM | POA: Diagnosis not present

## 2015-02-07 DIAGNOSIS — I4891 Unspecified atrial fibrillation: Secondary | ICD-10-CM

## 2015-02-07 DIAGNOSIS — I482 Chronic atrial fibrillation, unspecified: Secondary | ICD-10-CM

## 2015-02-07 DIAGNOSIS — I2510 Atherosclerotic heart disease of native coronary artery without angina pectoris: Secondary | ICD-10-CM | POA: Diagnosis not present

## 2015-02-07 DIAGNOSIS — Z5181 Encounter for therapeutic drug level monitoring: Secondary | ICD-10-CM

## 2015-02-07 LAB — POCT INR: INR: 2.7

## 2015-02-21 ENCOUNTER — Ambulatory Visit (INDEPENDENT_AMBULATORY_CARE_PROVIDER_SITE_OTHER): Payer: Medicare Other

## 2015-02-21 DIAGNOSIS — I482 Chronic atrial fibrillation, unspecified: Secondary | ICD-10-CM

## 2015-02-21 DIAGNOSIS — I4892 Unspecified atrial flutter: Secondary | ICD-10-CM

## 2015-02-21 DIAGNOSIS — Z5181 Encounter for therapeutic drug level monitoring: Secondary | ICD-10-CM

## 2015-02-21 LAB — POCT INR: INR: 2

## 2015-02-23 DIAGNOSIS — B351 Tinea unguium: Secondary | ICD-10-CM | POA: Diagnosis not present

## 2015-02-23 DIAGNOSIS — E119 Type 2 diabetes mellitus without complications: Secondary | ICD-10-CM | POA: Diagnosis not present

## 2015-03-15 ENCOUNTER — Ambulatory Visit (INDEPENDENT_AMBULATORY_CARE_PROVIDER_SITE_OTHER): Payer: Medicare Other | Admitting: *Deleted

## 2015-03-15 DIAGNOSIS — I4891 Unspecified atrial fibrillation: Secondary | ICD-10-CM

## 2015-03-15 DIAGNOSIS — I4892 Unspecified atrial flutter: Secondary | ICD-10-CM

## 2015-03-15 DIAGNOSIS — I482 Chronic atrial fibrillation, unspecified: Secondary | ICD-10-CM

## 2015-03-15 DIAGNOSIS — Z5181 Encounter for therapeutic drug level monitoring: Secondary | ICD-10-CM

## 2015-03-15 LAB — POCT INR: INR: 2.4

## 2015-03-22 ENCOUNTER — Other Ambulatory Visit: Payer: Self-pay | Admitting: Cardiology

## 2015-03-23 ENCOUNTER — Other Ambulatory Visit: Payer: Self-pay | Admitting: *Deleted

## 2015-03-23 MED ORDER — WARFARIN SODIUM 5 MG PO TABS
ORAL_TABLET | ORAL | Status: DC
Start: 2015-03-23 — End: 2015-11-01

## 2015-04-12 ENCOUNTER — Ambulatory Visit (INDEPENDENT_AMBULATORY_CARE_PROVIDER_SITE_OTHER): Payer: Medicare Other | Admitting: *Deleted

## 2015-04-12 DIAGNOSIS — I4892 Unspecified atrial flutter: Secondary | ICD-10-CM

## 2015-04-12 DIAGNOSIS — Z5181 Encounter for therapeutic drug level monitoring: Secondary | ICD-10-CM | POA: Diagnosis not present

## 2015-04-12 DIAGNOSIS — I482 Chronic atrial fibrillation, unspecified: Secondary | ICD-10-CM

## 2015-04-12 DIAGNOSIS — I4891 Unspecified atrial fibrillation: Secondary | ICD-10-CM | POA: Diagnosis not present

## 2015-04-12 LAB — POCT INR: INR: 2.6

## 2015-05-03 DIAGNOSIS — H2512 Age-related nuclear cataract, left eye: Secondary | ICD-10-CM | POA: Diagnosis not present

## 2015-05-05 DIAGNOSIS — H2512 Age-related nuclear cataract, left eye: Secondary | ICD-10-CM | POA: Diagnosis not present

## 2015-05-06 ENCOUNTER — Ambulatory Visit (INDEPENDENT_AMBULATORY_CARE_PROVIDER_SITE_OTHER): Payer: Medicare Other

## 2015-05-06 ENCOUNTER — Encounter: Payer: Self-pay | Admitting: *Deleted

## 2015-05-06 DIAGNOSIS — I4892 Unspecified atrial flutter: Secondary | ICD-10-CM | POA: Diagnosis not present

## 2015-05-06 DIAGNOSIS — Z5181 Encounter for therapeutic drug level monitoring: Secondary | ICD-10-CM

## 2015-05-06 DIAGNOSIS — I482 Chronic atrial fibrillation, unspecified: Secondary | ICD-10-CM

## 2015-05-06 DIAGNOSIS — I4891 Unspecified atrial fibrillation: Secondary | ICD-10-CM | POA: Diagnosis not present

## 2015-05-06 LAB — POCT INR: INR: 2.8

## 2015-05-06 NOTE — Discharge Instructions (Signed)

## 2015-05-11 ENCOUNTER — Ambulatory Visit
Admission: RE | Admit: 2015-05-11 | Discharge: 2015-05-11 | Disposition: A | Discharge status: Home / Self Care | Payer: Medicare Other | Source: Ambulatory Visit | Attending: Ophthalmology | Admitting: Ophthalmology

## 2015-05-11 ENCOUNTER — Encounter: Payer: Self-pay | Admitting: Anesthesiology

## 2015-05-11 ENCOUNTER — Ambulatory Visit: Payer: Medicare Other | Admitting: Anesthesiology

## 2015-05-11 ENCOUNTER — Encounter
Admission: RE | Disposition: A | Discharge status: Home / Self Care | Payer: Self-pay | Source: Ambulatory Visit | Attending: Ophthalmology

## 2015-05-11 DIAGNOSIS — E079 Disorder of thyroid, unspecified: Secondary | ICD-10-CM | POA: Insufficient documentation

## 2015-05-11 DIAGNOSIS — G4730 Sleep apnea, unspecified: Secondary | ICD-10-CM | POA: Diagnosis not present

## 2015-05-11 DIAGNOSIS — Z85528 Personal history of other malignant neoplasm of kidney: Secondary | ICD-10-CM | POA: Insufficient documentation

## 2015-05-11 DIAGNOSIS — Z87891 Personal history of nicotine dependence: Secondary | ICD-10-CM | POA: Insufficient documentation

## 2015-05-11 DIAGNOSIS — H269 Unspecified cataract: Secondary | ICD-10-CM | POA: Diagnosis present

## 2015-05-11 DIAGNOSIS — H409 Unspecified glaucoma: Secondary | ICD-10-CM | POA: Diagnosis not present

## 2015-05-11 DIAGNOSIS — Z7901 Long term (current) use of anticoagulants: Secondary | ICD-10-CM | POA: Diagnosis not present

## 2015-05-11 DIAGNOSIS — I4891 Unspecified atrial fibrillation: Secondary | ICD-10-CM | POA: Diagnosis not present

## 2015-05-11 DIAGNOSIS — E1140 Type 2 diabetes mellitus with diabetic neuropathy, unspecified: Secondary | ICD-10-CM | POA: Diagnosis not present

## 2015-05-11 DIAGNOSIS — H2512 Age-related nuclear cataract, left eye: Secondary | ICD-10-CM | POA: Diagnosis not present

## 2015-05-11 DIAGNOSIS — Z8551 Personal history of malignant neoplasm of bladder: Secondary | ICD-10-CM | POA: Diagnosis not present

## 2015-05-11 DIAGNOSIS — I429 Cardiomyopathy, unspecified: Secondary | ICD-10-CM | POA: Diagnosis not present

## 2015-05-11 DIAGNOSIS — I10 Essential (primary) hypertension: Secondary | ICD-10-CM | POA: Diagnosis not present

## 2015-05-11 DIAGNOSIS — Z881 Allergy status to other antibiotic agents status: Secondary | ICD-10-CM | POA: Diagnosis not present

## 2015-05-11 DIAGNOSIS — Z79899 Other long term (current) drug therapy: Secondary | ICD-10-CM | POA: Diagnosis not present

## 2015-05-11 HISTORY — DX: Type 2 diabetes mellitus with diabetic neuropathy, unspecified: E11.40

## 2015-05-11 HISTORY — DX: Headache: R51

## 2015-05-11 HISTORY — DX: Essential (primary) hypertension: I10

## 2015-05-11 HISTORY — PX: CATARACT EXTRACTION W/PHACO: SHX586

## 2015-05-11 HISTORY — DX: Headache, unspecified: R51.9

## 2015-05-11 HISTORY — DX: Motion sickness, initial encounter: T75.3XXA

## 2015-05-11 LAB — GLUCOSE, CAPILLARY
GLUCOSE-CAPILLARY: 128 mg/dL — AB (ref 65–99)
GLUCOSE-CAPILLARY: 123 mg/dL — AB (ref 65–99)

## 2015-05-11 SURGERY — PHACOEMULSIFICATION, CATARACT, WITH IOL INSERTION
Anesthesia: Monitor Anesthesia Care | Laterality: Left | Wound class: Clean

## 2015-05-11 MED ORDER — ARMC OPHTHALMIC DILATING GEL
1.0000 "application " | OPHTHALMIC | Status: DC | PRN
  Administered 2015-05-11 (×2): 1 via OPHTHALMIC

## 2015-05-11 MED ORDER — BSS IO SOLN
INTRAOCULAR | Status: DC | PRN
  Administered 2015-05-11: 65 mL via OPHTHALMIC

## 2015-05-11 MED ORDER — TIMOLOL MALEATE 0.5 % OP SOLN
OPHTHALMIC | Status: DC | PRN
  Administered 2015-05-11: 1 [drp] via OPHTHALMIC

## 2015-05-11 MED ORDER — TETRACAINE HCL 0.5 % OP SOLN
1.0000 [drp] | OPHTHALMIC | Status: DC | PRN
  Administered 2015-05-11: 1 [drp] via OPHTHALMIC

## 2015-05-11 MED ORDER — MIDAZOLAM HCL 2 MG/2ML IJ SOLN
INTRAMUSCULAR | Status: DC | PRN
  Administered 2015-05-11: 2 mg via INTRAVENOUS

## 2015-05-11 MED ORDER — POVIDONE-IODINE 5 % OP SOLN
1.0000 "application " | OPHTHALMIC | Status: DC | PRN
  Administered 2015-05-11: 1 via OPHTHALMIC

## 2015-05-11 MED ORDER — NA HYALUR & NA CHOND-NA HYALUR 0.4-0.35 ML IO KIT
PACK | INTRAOCULAR | Status: DC | PRN
  Administered 2015-05-11: 1 mL via INTRAOCULAR

## 2015-05-11 MED ORDER — LACTATED RINGERS IV SOLN: INTRAVENOUS | Status: DC

## 2015-05-11 MED ORDER — CEFUROXIME OPHTHALMIC INJECTION 1 MG/0.1 ML
INJECTION | OPHTHALMIC | Status: DC | PRN
  Administered 2015-05-11: 0.1 mL via INTRACAMERAL

## 2015-05-11 MED ORDER — BRIMONIDINE TARTRATE 0.2 % OP SOLN
OPHTHALMIC | Status: DC | PRN
  Administered 2015-05-11: 1 [drp] via OPHTHALMIC

## 2015-05-11 MED ORDER — FENTANYL CITRATE (PF) 100 MCG/2ML IJ SOLN
INTRAMUSCULAR | Status: DC | PRN
  Administered 2015-05-11: 50 ug via INTRAVENOUS

## 2015-05-11 SURGICAL SUPPLY — 28 items
CANNULA ANT/CHMB 27G (MISCELLANEOUS) ×1 IMPLANT
CANNULA ANT/CHMB 27GA (MISCELLANEOUS) ×3 IMPLANT
GLOVE SURG LX 7.5 STRW (GLOVE) ×2
GLOVE SURG LX STRL 7.5 STRW (GLOVE) ×1 IMPLANT
GLOVE SURG TRIUMPH 8.0 PF LTX (GLOVE) ×3 IMPLANT
GOWN STRL REUS W/ TWL LRG LVL3 (GOWN DISPOSABLE) ×2 IMPLANT
GOWN STRL REUS W/TWL LRG LVL3 (GOWN DISPOSABLE) ×6
LENS IOL ACRSF IQ PC 19.0 (Intraocular Lens) IMPLANT
LENS IOL ACRYSOF IQ POST 19.0 (Intraocular Lens) ×3 IMPLANT
MARKER SKIN SURG W/RULER VIO (MISCELLANEOUS) ×3 IMPLANT
NDL FILTER BLUNT 18X1 1/2 (NEEDLE) ×1 IMPLANT
NDL RETROBULBAR .5 NSTRL (NEEDLE) IMPLANT
NEEDLE FILTER BLUNT 18X 1/2SAF (NEEDLE) ×2
NEEDLE FILTER BLUNT 18X1 1/2 (NEEDLE) ×1 IMPLANT
PACK CATARACT BRASINGTON (MISCELLANEOUS) ×3 IMPLANT
PACK EYE AFTER SURG (MISCELLANEOUS) ×3 IMPLANT
PACK OPTHALMIC (MISCELLANEOUS) ×3 IMPLANT
RING MALYGIN 7.0 (MISCELLANEOUS) IMPLANT
SUT ETHILON 10-0 CS-B-6CS-B-6 (SUTURE)
SUT VICRYL  9 0 (SUTURE)
SUT VICRYL 9 0 (SUTURE) IMPLANT
SUTURE EHLN 10-0 CS-B-6CS-B-6 (SUTURE) IMPLANT
SYR 3ML LL SCALE MARK (SYRINGE) ×3 IMPLANT
SYR 5ML LL (SYRINGE) IMPLANT
SYR TB 1ML LUER SLIP (SYRINGE) ×3 IMPLANT
WATER STERILE IRR 250ML POUR (IV SOLUTION) ×3 IMPLANT
WATER STERILE IRR 500ML POUR (IV SOLUTION) IMPLANT
WIPE NON LINTING 3.25X3.25 (MISCELLANEOUS) ×3 IMPLANT

## 2015-05-11 NOTE — H&P (Signed)
  The History and Physical notes were scanned in.  The patient remains stable and unchanged from the H&P.   Previous H&P reviewed, patient examined, and there are no changes.  [name] [name] 05/11/2015 7:29 AM

## 2015-05-11 NOTE — Anesthesia Preprocedure Evaluation (Addendum)
Anesthesia Evaluation  Patient identified by MRN, date of birth, ID band Patient awake    Reviewed: Allergy & Precautions, H&P , NPO status , Patient's Chart, lab work & pertinent test results, reviewed documented beta blocker date and time   Airway Mallampati: II  TM Distance: >3 FB Neck ROM: full    Dental no notable dental hx.    Pulmonary sleep apnea and Continuous Positive Airway Pressure Ventilation , former smoker,  breath sounds clear to auscultation  Pulmonary exam normal       Cardiovascular Exercise Tolerance: Good hypertension, + dysrhythmias Atrial Fibrillation Rhythm:regular Rate:Normal     Neuro/Psych  Headaches, CVA, No Residual Symptoms negative psych ROS   GI/Hepatic negative GI ROS, Neg liver ROS,   Endo/Other  negative endocrine ROSdiabetes, Type 2  Renal/GU negative Renal ROS  negative genitourinary   Musculoskeletal   Abdominal   Peds  Hematology negative hematology ROS (+)   Anesthesia Other Findings   Reproductive/Obstetrics negative OB ROS                            Anesthesia Physical Anesthesia Plan  ASA: III  Anesthesia Plan: MAC   Post-op Pain Management:    Induction:   Airway Management Planned:   Additional Equipment:   Intra-op Plan:   Post-operative Plan:   Informed Consent: I have reviewed the patients History and Physical, chart, labs and discussed the procedure including the risks, benefits and alternatives for the proposed anesthesia with the patient or authorized representative who has indicated his/her understanding and acceptance.   Dental Advisory Given  Plan Discussed with: CRNA  Anesthesia Plan Comments:       Anesthesia Quick Evaluation

## 2015-05-11 NOTE — Anesthesia Postprocedure Evaluation (Signed)
  Anesthesia Post-op Note  Patient: [name] [name]  Procedure(s) Performed: Procedure(s) with comments: CATARACT EXTRACTION PHACO AND INTRAOCULAR LENS PLACEMENT (IOC) (Left) - DIABETIC - oral meds, CPAP  Anesthesia type:MAC  Patient location: PACU  Post pain: Pain level controlled  Post assessment: Post-op Vital signs reviewed, Patient's Cardiovascular Status Stable, Respiratory Function Stable, Patent Airway and No signs of Nausea or vomiting  Post vital signs: Reviewed and stable  Last Vitals:  Filed Vitals:   05/11/15 0853  BP: 124/73  Pulse: 74  Temp: 36.7 C  Resp: 16    Level of consciousness: awake, alert  and patient cooperative  Complications: No apparent anesthesia complications

## 2015-05-11 NOTE — Anesthesia Procedure Notes (Signed)
Procedure Name: MAC Performed by: Rumeal Cullipher Pre-anesthesia Checklist: Patient identified, Emergency Drugs available, Suction available, Timeout performed and Patient being monitored Patient Re-evaluated:Patient Re-evaluated prior to inductionOxygen Delivery Method: Nasal cannula Placement Confirmation: positive ETCO2       

## 2015-05-11 NOTE — Op Note (Signed)
OPERATIVE NOTE  [name] [name] [id number] 05/11/2015   PREOPERATIVE DIAGNOSIS:  Nuclear sclerotic cataract left eye. H25.12   POSTOPERATIVE DIAGNOSIS:    Nuclear sclerotic cataract left eye.     PROCEDURE:  Phacoemusification with posterior chamber intraocular lens placement of the left eye   LENS:   Implant Name Type Inv. Item Serial No. Manufacturer Lot No. LRB No. Used  IMPLANT LENS - [id number] Intraocular Lens IMPLANT LENS [id number] ALCON   Left 1     [id number] 20.0 D   ULTRASOUND TIME: 17  % of 1 minutes 31 seconds, CDE 15.8  SURGEON:  Wyonia Hough, MD   ANESTHESIA:  Topical with tetracaine drops and 2% Xylocaine jelly.   COMPLICATIONS:  None.   DESCRIPTION OF PROCEDURE:  The patient was identified in the holding room and transported to the operating room and placed in the supine position under the operating microscope.  The left eye was identified as the operative eye and it was prepped and draped in the usual sterile ophthalmic fashion.   A 1 millimeter clear-corneal paracentesis was made at the 1:30 position.  The anterior chamber was filled with Viscoat viscoelastic.  A 2.4 millimeter keratome was used to make a near-clear corneal incision at the 10:30 position.  .  A curvilinear capsulorrhexis was made with a cystotome and capsulorrhexis forceps.  Balanced salt solution was used to hydrodissect and hydrodelineate the nucleus.   Phacoemulsification was then used in stop and chop fashion to remove the lens nucleus and epinucleus.  The remaining cortex was then removed using the irrigation and aspiration handpiece. Provisc was then placed into the capsular bag to distend it for lens placement.  A lens was then injected into the capsular bag.  The remaining viscoelastic was aspirated.   Wounds were hydrated with balanced salt solution.  The anterior chamber was inflated to a physiologic pressure with balanced salt solution.  No wound leaks were noted. Cefuroxime 0.1 ml  of a 10mg /ml solution was injected into the anterior chamber for a dose of 1 mg of intracameral antibiotic at the completion of the case.   Timolol and Brimonidine drops were applied to the eye.  The patient was taken to the recovery room in stable condition without complications of anesthesia or surgery.  [name] [name] 05/11/2015, 8:46 AM

## 2015-05-11 NOTE — Transfer of Care (Signed)
Immediate Anesthesia Transfer of Care Note  Patient: [name] [name]  Procedure(s) Performed: Procedure(s) with comments: CATARACT EXTRACTION PHACO AND INTRAOCULAR LENS PLACEMENT (IOC) (Left) - DIABETIC - oral meds, CPAP  Patient Location: PACU  Anesthesia Type: MAC  Level of Consciousness: awake, alert  and patient cooperative  Airway and Oxygen Therapy: Patient Spontanous Breathing and Patient connected to supplemental oxygen  Post-op Assessment: Post-op Vital signs reviewed, Patient's Cardiovascular Status Stable, Respiratory Function Stable, Patent Airway and No signs of Nausea or vomiting  Post-op Vital Signs: Reviewed and stable  Complications: No apparent anesthesia complications

## 2015-05-12 ENCOUNTER — Encounter: Payer: Self-pay | Admitting: Ophthalmology

## 2015-05-18 ENCOUNTER — Other Ambulatory Visit: Payer: Self-pay | Admitting: Family Medicine

## 2015-05-25 DIAGNOSIS — E119 Type 2 diabetes mellitus without complications: Secondary | ICD-10-CM | POA: Diagnosis not present

## 2015-05-25 DIAGNOSIS — B351 Tinea unguium: Secondary | ICD-10-CM | POA: Diagnosis not present

## 2015-05-25 DIAGNOSIS — L851 Acquired keratosis [keratoderma] palmaris et plantaris: Secondary | ICD-10-CM | POA: Diagnosis not present

## 2015-06-03 ENCOUNTER — Ambulatory Visit (INDEPENDENT_AMBULATORY_CARE_PROVIDER_SITE_OTHER): Payer: Medicare Other

## 2015-06-03 DIAGNOSIS — I4891 Unspecified atrial fibrillation: Secondary | ICD-10-CM | POA: Diagnosis not present

## 2015-06-03 DIAGNOSIS — I4892 Unspecified atrial flutter: Secondary | ICD-10-CM

## 2015-06-03 DIAGNOSIS — I482 Chronic atrial fibrillation, unspecified: Secondary | ICD-10-CM

## 2015-06-03 DIAGNOSIS — Z5181 Encounter for therapeutic drug level monitoring: Secondary | ICD-10-CM

## 2015-06-03 LAB — POCT INR: INR: 3.1

## 2015-06-06 ENCOUNTER — Ambulatory Visit: Payer: Self-pay | Admitting: Family Medicine

## 2015-06-13 ENCOUNTER — Emergency Department
Admission: EM | Admit: 2015-06-13 | Discharge: 2015-06-13 | Disposition: A | Discharge status: Home / Self Care | Payer: Medicare Other | Attending: Emergency Medicine | Admitting: Emergency Medicine

## 2015-06-13 ENCOUNTER — Ambulatory Visit (INDEPENDENT_AMBULATORY_CARE_PROVIDER_SITE_OTHER): Payer: Medicare Other | Admitting: Family Medicine

## 2015-06-13 ENCOUNTER — Encounter: Payer: Self-pay | Admitting: Family Medicine

## 2015-06-13 ENCOUNTER — Encounter: Payer: Self-pay | Admitting: Emergency Medicine

## 2015-06-13 VITALS — BP 138/70 | HR 72 | Temp 98.1°F | Resp 16 | Wt 196.8 lb

## 2015-06-13 DIAGNOSIS — R0989 Other specified symptoms and signs involving the circulatory and respiratory systems: Secondary | ICD-10-CM

## 2015-06-13 DIAGNOSIS — J029 Acute pharyngitis, unspecified: Secondary | ICD-10-CM | POA: Diagnosis not present

## 2015-06-13 DIAGNOSIS — I10 Essential (primary) hypertension: Secondary | ICD-10-CM | POA: Diagnosis not present

## 2015-06-13 DIAGNOSIS — R42 Dizziness and giddiness: Secondary | ICD-10-CM | POA: Insufficient documentation

## 2015-06-13 DIAGNOSIS — Z79899 Other long term (current) drug therapy: Secondary | ICD-10-CM | POA: Insufficient documentation

## 2015-06-13 DIAGNOSIS — F458 Other somatoform disorders: Secondary | ICD-10-CM | POA: Insufficient documentation

## 2015-06-13 DIAGNOSIS — I159 Secondary hypertension, unspecified: Secondary | ICD-10-CM

## 2015-06-13 DIAGNOSIS — M25569 Pain in unspecified knee: Secondary | ICD-10-CM | POA: Insufficient documentation

## 2015-06-13 DIAGNOSIS — Z87891 Personal history of nicotine dependence: Secondary | ICD-10-CM | POA: Insufficient documentation

## 2015-06-13 DIAGNOSIS — E1140 Type 2 diabetes mellitus with diabetic neuropathy, unspecified: Secondary | ICD-10-CM | POA: Insufficient documentation

## 2015-06-13 DIAGNOSIS — E119 Type 2 diabetes mellitus without complications: Secondary | ICD-10-CM

## 2015-06-13 DIAGNOSIS — Z125 Encounter for screening for malignant neoplasm of prostate: Secondary | ICD-10-CM | POA: Insufficient documentation

## 2015-06-13 DIAGNOSIS — Z7901 Long term (current) use of anticoagulants: Secondary | ICD-10-CM | POA: Insufficient documentation

## 2015-06-13 DIAGNOSIS — J189 Pneumonia, unspecified organism: Secondary | ICD-10-CM

## 2015-06-13 DIAGNOSIS — H409 Unspecified glaucoma: Secondary | ICD-10-CM | POA: Insufficient documentation

## 2015-06-13 DIAGNOSIS — E291 Testicular hypofunction: Secondary | ICD-10-CM | POA: Insufficient documentation

## 2015-06-13 DIAGNOSIS — E7800 Pure hypercholesterolemia, unspecified: Secondary | ICD-10-CM | POA: Insufficient documentation

## 2015-06-13 DIAGNOSIS — R6889 Other general symptoms and signs: Secondary | ICD-10-CM | POA: Insufficient documentation

## 2015-06-13 DIAGNOSIS — D090 Carcinoma in situ of bladder: Secondary | ICD-10-CM | POA: Insufficient documentation

## 2015-06-13 DIAGNOSIS — E039 Hypothyroidism, unspecified: Secondary | ICD-10-CM

## 2015-06-13 DIAGNOSIS — E1159 Type 2 diabetes mellitus with other circulatory complications: Secondary | ICD-10-CM | POA: Insufficient documentation

## 2015-06-13 DIAGNOSIS — C649 Malignant neoplasm of unspecified kidney, except renal pelvis: Secondary | ICD-10-CM | POA: Insufficient documentation

## 2015-06-13 DIAGNOSIS — I2510 Atherosclerotic heart disease of native coronary artery without angina pectoris: Secondary | ICD-10-CM | POA: Insufficient documentation

## 2015-06-13 DIAGNOSIS — I152 Hypertension secondary to endocrine disorders: Secondary | ICD-10-CM | POA: Insufficient documentation

## 2015-06-13 DIAGNOSIS — R5383 Other fatigue: Secondary | ICD-10-CM | POA: Insufficient documentation

## 2015-06-13 DIAGNOSIS — M1990 Unspecified osteoarthritis, unspecified site: Secondary | ICD-10-CM | POA: Insufficient documentation

## 2015-06-13 HISTORY — DX: Pneumonia, unspecified organism: J18.9

## 2015-06-13 LAB — POCT GLYCOSYLATED HEMOGLOBIN (HGB A1C): HEMOGLOBIN A1C: 6.5

## 2015-06-13 MED ORDER — LEVOTHYROXINE SODIUM 50 MCG PO TABS: 50.0000 ug | ORAL_TABLET | Freq: Every day | ORAL | Status: DC

## 2015-06-13 MED ORDER — LIDOCAINE VISCOUS 2 % MT SOLN
15.0000 mL | Freq: Once | OROMUCOSAL | Status: AC
  Administered 2015-06-13: 15 mL via OROMUCOSAL
  Filled 2015-06-13: qty 15

## 2015-06-13 MED ORDER — LIDOCAINE VISCOUS 2 % MT SOLN: 20.0000 mL | OROMUCOSAL | Status: DC | PRN

## 2015-06-13 NOTE — Discharge Instructions (Signed)
Globus Syndrome Globus Syndrome is a feeling of a lump or a sensation of something caught in your throat. Eating food or drinking fluids does not seem to get rid of it. Yet it is not noticeable during the actual act of swallowing food or liquids. Usually there is nothing physically wrong. It is troublesome because it is an unpleasant sensation which is sometimes difficult to ignore and at times may seem to worsen. The syndrome is quite common. It is estimated 45% of the population experiences features of the condition at some stage during their lives. The symptoms are usually temporary. The largest group of people who feel the need to seek medical treatment is females between the ages of 44 to 62.  CAUSES  Globus Syndrome appears to be triggered by or aggravated by stress, anxiety and depression.  Tension related to stress could product abnormal muscle spasms in the esophagus which would account for the sensation of a lump or ball in your throat.  Frequent swallowing or drying of the throat caused by anxiety or other strong emotions can also produce this uncomfortable sensation in your throat.  Fear and sadness can be expressed by the body in many ways. For instance, if you had a relative with throat cancer you might become overly concerned about your own health and develop uncomfortable sensations in your throat.  The reaction to a crisis or a trauma event in your life can take the form of a lump in your throat. It is as if you are indirectly saying you can not handle or "swallow" one more thing. DIAGNOSIS  Usually your caregiver will know what is wrong by talking to you and examining you. If the condition persists for several days, more testing may be done to make sure there is not another problem present. This is usually not the case. TREATMENT   Reassurance is often the best treatment available. Usually the problem leaves without treatment over several days.  Sometimes anti-anxiety medications  may be prescribed.  Counseling or talk therapy can also help with strong underlying emotions.  Note that in most cases this is not something that keeps coming back and you should not be concerned or worried. Document Released: 12/22/2003 Document Revised: 12/24/2011 Document Reviewed: 05/20/2008 [facility] Patient Information 2015 [place], [place]. This information is not intended to replace advice given to you by your health care provider. Make sure you discuss any questions you have with your health care provider.  Pharyngitis Pharyngitis is a sore throat (pharynx). There is redness, pain, and swelling of your throat. HOME CARE   Drink enough fluids to keep your pee (urine) clear or pale yellow.  Only take medicine as told by your doctor.  You may get sick again if you do not take medicine as told. Finish your medicines, even if you start to feel better.  Do not take aspirin.  Rest.  Rinse your mouth (gargle) with salt water ( tsp of salt per 1 qt of water) every 1-2 hours. This will help the pain.  If you are not at risk for choking, you can suck on hard candy or sore throat lozenges. GET HELP IF:  You have large, tender lumps on your neck.  You have a rash.  You cough up green, yellow-brown, or bloody spit. GET HELP RIGHT AWAY IF:   You have a stiff neck.  You drool or cannot swallow liquids.  You throw up (vomit) or are not able to keep medicine or liquids down.  You have  very bad pain that does not go away with medicine.  You have problems breathing (not from a stuffy nose). MAKE SURE YOU:   Understand these instructions.  Will watch your condition.  Will get help right away if you are not doing well or get worse. Document Released: 03/19/2008 Document Revised: 07/22/2013 Document Reviewed: 06/08/2013 [facility] Patient Information 2015 [place], [place]. This information is not intended to replace advice given to you by your health care provider. Make sure you  discuss any questions you have with your health care provider.  Use the lidocaine gargle as needed for sore throat pain relief. Follow-up with Dr. Rosanna Randy for further care & management. Consider dosing an OTC allergy medicine (antihistamine) for sinus drainage relief.

## 2015-06-13 NOTE — Progress Notes (Signed)
Patient ID: [name] [name], male   DOB: [birth year], 81 y.o.   MRN: [id number]       Patient: [name] [name] Male    DOB: 07-Dec-1933   81 y.o.   MRN: [id number] Visit Date: 06/13/2015  Today's Provider: [name], MD   Chief Complaint  Patient presents with  . Hypertension    follow-up, last OV was on 02/07/2015  . Hypothyroidism    follow-up, on levothyroxine 50 mcg   . Diabetes    follow-up, last A1c was 6.4 on 02/07/2015  . Shortness of Breath    went to the ER this am, PA did not think it was viral. pt had some sputum coughed up color yellowish/green   Subjective:    Hypertension This is a chronic problem. The current episode started more than 1 year ago. The problem is controlled. Associated symptoms include shortness of breath. Pertinent negatives include no anxiety, blurred vision, chest pain, headaches or palpitations.  Diabetes He presents for his follow-up diabetic visit. He has type 2 diabetes mellitus. His disease course has been stable. There are no hypoglycemic associated symptoms. Pertinent negatives for hypoglycemia include no dizziness, headaches or hunger. Pertinent negatives for diabetes include no blurred vision, no chest pain, no polydipsia and no polyphagia. (Cataract surgery on left eye ) There are no hypoglycemic complications. His weight is stable. He is following a generally healthy diet. Meal planning includes avoidance of concentrated sweets. His breakfast blood glucose is taken between 6-7 am. His breakfast blood glucose range is generally 110-130 mg/dl.  Shortness of Breath This is a new problem. The current episode started today. The problem has been unchanged (no breathibng problems but i am having trouble swallowing" pt stated). Associated symptoms include a fever and a sore throat. Pertinent negatives include no chest pain, headaches, hemoptysis, leg swelling or wheezing. Associated symptoms comments: Temp was 99.3 last night. Treatments tried:  went to the ER this am. they prescriibed lidocane for the sore throat        Allergies  Allergen Reactions  . Macrolides And Ketolides Other (See Comments)  . Mycinettes   . Nitrofuran Derivatives Other (See Comments)  . Nitrofurantoin Other (See Comments)  . Erythromycin Itching and Rash    Other reaction(s): UNKNOWN  And red skin   Previous Medications   BETA CAROTENE PO    Take 1 capsule by mouth daily.   BIMATOPROST OP    Apply 1 drop to eye every evening.   CALCIUM-VITAMIN D 600-200 MG-UNIT PER TABLET    Take 1 tablet by mouth daily.     CO-ENZYME Q-10 30 MG CAPSULE    Take 30 mg by mouth daily.     DIGOXIN (LANOXIN) 0.125 MG TABLET    TAKE ONE TABLET BY MOUTH ONCE DAILY   FISH OIL-OMEGA-3 FATTY ACIDS 1000 MG CAPSULE    Take 2 g by mouth 2 (two) times daily.     GELATIN CAPSULES, EMPTY, CAPS    4 capsules by Does not apply route 2 (two) times daily.     GLUCOSAMINE 500 MG CAPS    Take 1 capsule by mouth daily.     LATANOPROST (XALATAN) 0.005 % OPHTHALMIC SOLUTION    Place 1 drop into both eyes at bedtime.    LEVOTHYROXINE (SYNTHROID, LEVOTHROID) 50 MCG TABLET    TAKE ONE TABLET BY MOUTH ONCE DAILY   LIDOCAINE (XYLOCAINE) 2 % SOLUTION    Use as directed 20 mLs in the mouth or throat  as needed for mouth pain.   METFORMIN (GLUCOPHAGE) 500 MG TABLET    Take 500 mg by mouth daily.     MULTIPLE VITAMIN (MULTIVITAMIN) TABLET    Take 1 tablet by mouth daily.     POTASSIUM PO    Take 1 tablet by mouth daily.     SIMVASTATIN (ZOCOR) 40 MG TABLET    Take 0.5 tablets (20 mg total) by mouth at bedtime.   VERAPAMIL (CALAN-SR) 240 MG CR TABLET    TAKE ONE TABLET BY MOUTH ONCE DAILY   VITAMIN B-12 (CYANOCOBALAMIN) 500 MCG TABLET    Take 500 mcg by mouth daily.     VITAMIN C (ASCORBIC ACID) 500 MG TABLET    Take 500 mg by mouth daily.     VITAMIN E 600 UNIT CAPSULE    Take 600 Units by mouth daily.     WARFARIN (COUMADIN) 5 MG TABLET    1 pill everyday except 1/2 pill on Sundays and Wednesdays  or as directed    Review of Systems  Constitutional: Positive for fever.  HENT: Positive for sore throat.   Eyes: Negative for blurred vision.  Respiratory: Positive for shortness of breath. Negative for hemoptysis and wheezing.   Cardiovascular: Negative for chest pain, palpitations and leg swelling.  Endocrine: Negative for polydipsia and polyphagia.  Allergic/Immunologic: Negative.   Neurological: Negative for dizziness and headaches.  Psychiatric/Behavioral: Negative.     Social History  Substance Use Topics  . Smoking status: Former Smoker    Types: Cigars    Quit date: 10/02/1974  . Smokeless tobacco: Not on file  . Alcohol Use: Yes     Comment: rare - 1 glass wine/month   Objective:   BP 138/70 mmHg  Pulse 72  Temp(Src) 98.1 F (36.7 C) (Oral)  Resp 16  Wt 196 lb 12.8 oz (89.268 kg)  Physical Exam  Constitutional: He is oriented to person, place, and time. He appears well-developed and well-nourished.  HENT:  Head: Normocephalic and atraumatic.  Right Ear: External ear normal.  Left Ear: External ear normal.  Nose: Nose normal.  Eyes: Conjunctivae are normal.  Neck: Neck supple.  Cardiovascular: Normal rate, regular rhythm and normal heart sounds.   Pulmonary/Chest: Effort normal and breath sounds normal.  Neurological: He is alert and oriented to person, place, and time.  Skin: Skin is warm and dry.  Psychiatric: He has a normal mood and affect. His behavior is normal. Judgment and thought content normal.        Assessment & Plan:     1. Secondary hypertension, unspecified Controlled  2. Hypothyroidism, unspecified hypothyroidism type  - levothyroxine (SYNTHROID, LEVOTHROID) 50 MCG tablet; Take 1 tablet (50 mcg total) by mouth daily.  Dispense: 90 tablet; Refill: 3  3. Type 2 diabetes mellitus without complication Good control. - POCT glycosylated hemoglobin (Hb A1C)--6.5 today 4.Viral Pharyngitis  I have done the exam and reviewed the above  chart and it is accurate to the best of my knowledge.       Milo Solana Cranford Mon, MD  [facility]

## 2015-06-13 NOTE — ED Provider Notes (Signed)
[facility] Emergency Department Provider Note ____________________________________________  Time seen: 0855  I have reviewed the triage vital signs and the nursing notes.  HISTORY  Chief Complaint  Sore Throat  HPI [name] [name] is a 81 y.o. male reports to the ED with complaints of sore throat since Saturday. He describes symptoms have worsened today. He denies fever, chills, sweats, or difficulty breathing.He reports onset of right-sided sore throat over the weekend. This morning he had a difficult time coughing up phlegm, feeling as if it were stuck in his throat. He denies choking, gagging, or dysphagia. He describes raspy mouth-breathing, but unobstructed nasal breathing. He is without fevers, chills, sweat, or URI symptoms.    Past Medical History  Diagnosis Date  . HOCM (hypertrophic obstructive cardiomyopathy)     mild SAM of the mitral valve, but no LVOT obstrcution. echo 11/09 EF 55%. [facility] -cho-11/11. mild regurgitation - echo - ``/09  . Head injury     when slipped on ice 2004-2005. stablaized and back on Coumadin   . Hypercholesterolemia     treated.   . Atrial fibrillation     and aflutter. pt has a left atrial circuit that is not ablated. was on amiodarone-stopped, now use rate control.   . Elevated liver enzymes     over time; hx  . Bladder cancer     hx; treated with BCG in past   . Diabetes mellitus     type not specified  . TSH elevation     on synthroid historically   . Homocystinemia     elevated, mild   . Glaucoma   . Gout   . Kidney mass     laproscopic surgery woth cryoablation of a mass outside kidney followed at [facility].   [name] Atherosclerosis of aorta     by CT scan in past  . Hypotension     orthostatic. dehydration. hospitalized 11/11  . Stroke     "2 old strokes" CT and MRI. [place] [facility] 11/11. diagnosis was dehydration, no acutal reports.   [place] Passey gait     August, 2012  . Sleep apnea    Significant obstructive sleep apnea diagnosed in August, 2012, the patient is to receive CPAP   . Excessive sweating   . Nausea   . Carotid artery disease     There was calcified plaque but no stenosis by carotid artery screening done at [facility] October, 2013  . Obstructive sleep apnea     CPAP started successfully 2014  . Ejection fraction   . Hypertension   . Dysrhythmia     A-Fib - Cardio visit 12/13/14 - chart review  . Headache     migraines - distant past  . Diabetic neuropathy     feet  . Motion sickness     ocean boats    Patient Active Problem List   Diagnosis Date Noted  . Benign head tremor 12/13/2014  . Ejection fraction   . HOCM (hypertrophic obstructive cardiomyopathy)   . Encounter for therapeutic drug monitoring 11/16/2013  . Obstructive sleep apnea   . Carotid artery disease   . Head injury   . Elevated liver enzymes   . TSH elevation   . Kidney mass   . Atherosclerosis of aorta   . Hypotension   . Stroke   . Unsteady gait   . Sleep apnea   . Excessive sweating   . Nausea   . ORTHOSTATIC HYPOTENSION, HX OF 09/01/2010  .  OVERWEIGHT 03/07/2009  . AODM 03/05/2009  . HYPERLIPIDEMIA 03/05/2009  . MITRAL REGURGITATION 03/05/2009  . Chronic atrial fibrillation 03/05/2009  . ATRIAL FLUTTER 03/05/2009    Past Surgical History  Procedure Laterality Date  . Tonsillectomy    . Bladder surgery    . Kidney surgery      "froze mass"  . Cataract extraction w/phaco Left 05/11/2015    Procedure: CATARACT EXTRACTION PHACO AND INTRAOCULAR LENS PLACEMENT (IOC);  Surgeon: Leandrew Koyanagi, MD;  Location: [place];  Service: Ophthalmology;  Laterality: Left;  DIABETIC - oral meds, CPAP    Current Outpatient Rx  Name  Route  Sig  Dispense  Refill  . digoxin (LANOXIN) 0.125 MG tablet      TAKE ONE TABLET BY MOUTH ONCE DAILY   90 tablet   1   . latanoprost (XALATAN) 0.005 % ophthalmic solution   Both Eyes   Place 1 drop into  both eyes at bedtime.          [name] levothyroxine (SYNTHROID, LEVOTHROID) 50 MCG tablet      TAKE ONE TABLET BY MOUTH ONCE DAILY   30 tablet   12   . metFORMIN (GLUCOPHAGE) 500 MG tablet   Oral   Take 500 mg by mouth daily.           . simvastatin (ZOCOR) 40 MG tablet   Oral   Take 0.5 tablets (20 mg total) by mouth at bedtime.   45 tablet   3   . verapamil (CALAN-SR) 240 MG CR tablet      TAKE ONE TABLET BY MOUTH ONCE DAILY   180 tablet   0   . warfarin (COUMADIN) 5 MG tablet      1 pill everyday except 1/2 pill on Sundays and Wednesdays or as directed   90 tablet   1   . BETA CAROTENE PO   Oral   Take 1 capsule by mouth daily.         [name] BIMATOPROST OP   Ophthalmic   Apply 1 drop to eye every evening.         . Calcium-Vitamin D 600-200 MG-UNIT per tablet   Oral   Take 1 tablet by mouth daily.           [name] co-enzyme Q-10 30 MG capsule   Oral   Take 30 mg by mouth daily.           . fish oil-omega-3 fatty acids 1000 MG capsule   Oral   Take 2 g by mouth 2 (two) times daily.           . Gelatin Capsules, Empty, CAPS   Does not apply   4 capsules by Does not apply route 2 (two) times daily.           . Glucosamine 500 MG CAPS   Oral   Take 1 capsule by mouth daily.           [name] lidocaine (XYLOCAINE) 2 % solution   Mouth/Throat   Use as directed 20 mLs in the mouth or throat as needed for mouth pain.   100 mL   0   . Multiple Vitamin (MULTIVITAMIN) tablet   Oral   Take 1 tablet by mouth daily.           [name] POTASSIUM PO   Oral   Take 1 tablet by mouth daily.           . vitamin B-12 (  CYANOCOBALAMIN) 500 MCG tablet   Oral   Take 500 mcg by mouth daily.           . vitamin C (ASCORBIC ACID) 500 MG tablet   Oral   Take 500 mg by mouth daily.           . vitamin E 600 UNIT capsule   Oral   Take 600 Units by mouth daily.            Allergies Erythromycin; Mycinettes; and Nitrofuran derivatives  No family history on  file.  Social History Social History  Substance Use Topics  . Smoking status: Former Smoker    Types: Cigars    Quit date: 10/02/1974  . Smokeless tobacco: None  . Alcohol Use: Yes     Comment: rare - 1 glass wine/month   Review of Systems  Constitutional: Negative for fever. Eyes: Negative for visual changes. ENT: Positive for sore throat. Cardiovascular: Negative for chest pain. Respiratory: Negative for shortness of breath. Gastrointestinal: Negative for abdominal pain, vomiting and diarrhea. Genitourinary: Negative for dysuria. Musculoskeletal: Negative for back pain. Skin: Negative for rash. Neurological: Negative for headaches, focal weakness or numbness. ____________________________________________  PHYSICAL EXAM:  VITAL SIGNS: ED Triage Vitals  Enc Vitals Group     BP 06/13/15 0828 171/78 mmHg     Pulse Rate 06/13/15 0828 98     Resp 06/13/15 0828 20     Temp --      Temp src --      SpO2 06/13/15 0828 96 %     Weight 06/13/15 0828 190 lb (86.183 kg)     Height 06/13/15 0828 5\' 9"  (1.753 m)     Head Cir --      Peak Flow --      Pain Score 06/13/15 0830 7     Pain Loc --      Pain Edu? --      Excl. in [place]? --    Constitutional: Alert and oriented. Well appearing and in no distress. Eyes: Conjunctivae are normal. PERRL. Normal extraocular movements. Ears: Canals clear and TMs intact.   Head: Normocephalic and atraumatic.   Nose: No congestion/rhinnorhea.   Mouth/Throat: Mucous membranes are moist. Uvula midline and tonsils absent. Posterior oropharynx with cobblestone appearance.    Neck: Supple. No thyromegaly. Hematological/Lymphatic/Immunilogical: No cervical lymphadenopathy. Cardiovascular: Normal rate, regular rhythm.  Respiratory: Normal respiratory effort. No wheezes/rales/rhonchi. Gastrointestinal: Soft and nontender. No distention. Musculoskeletal: Nontender with normal range of motion in all extremities.  Neurologic:  Normal gait  without ataxia. Normal speech and language. No gross focal neurologic deficits are appreciated. Skin:  Skin is warm, dry and intact. No rash noted. Psychiatric: Mood and affect are normal. Patient exhibits appropriate insight and judgment. ____________________________________________  PROCEDURES  2% viscous lidocaine gargle. ____________________________________________  INITIAL IMPRESSION / ASSESSMENT AND PLAN / ED COURSE  Pharyngitis without indication of infectious process. Globus sensation without dysphagia or aspiration. Will treat with allergic etiology in mind. Suggest OTC allergy medicine along with prescription for 2% viscous lidocaine for pain relief. Follow-up with Dr. Rosanna Randy this afternoon as scheduled.  ____________________________________________  FINAL CLINICAL IMPRESSION(S) / ED DIAGNOSES  Final diagnoses:  Globus sensation  Sore throat     Melvenia Needles, PA-C 06/13/15 [place], MD 06/13/15 [phone]

## 2015-06-13 NOTE — ED Notes (Signed)
Pt presents with sore throat since Saturday, worse today. States feels throat is very swollen and having trouble swallowing.

## 2015-06-30 ENCOUNTER — Ambulatory Visit (INDEPENDENT_AMBULATORY_CARE_PROVIDER_SITE_OTHER): Payer: Medicare Other | Admitting: *Deleted

## 2015-06-30 ENCOUNTER — Telehealth: Payer: Self-pay

## 2015-06-30 DIAGNOSIS — I482 Chronic atrial fibrillation, unspecified: Secondary | ICD-10-CM

## 2015-06-30 DIAGNOSIS — I4892 Unspecified atrial flutter: Secondary | ICD-10-CM | POA: Diagnosis not present

## 2015-06-30 DIAGNOSIS — I4891 Unspecified atrial fibrillation: Secondary | ICD-10-CM | POA: Diagnosis not present

## 2015-06-30 DIAGNOSIS — Z5181 Encounter for therapeutic drug level monitoring: Secondary | ICD-10-CM

## 2015-06-30 LAB — POCT INR: INR: 2.4

## 2015-06-30 NOTE — Telephone Encounter (Signed)
**Note De-Identified  Obfuscation** The pt was in the office today for a coumadin check and asked to schedule his f/u with Dr Ron Parker while he was here. He was advised that Dr Ron Parker is retiring at the end of the month and that we will need to discuss his f/u with Dr Ron Parker before scheduling.  At his last OV with Dr Ron Parker on 2/29 the pt was advised to f/u in late August to early September.  He is advised that I will forward this message to Dr Ron Parker and call him back with Dr Ron Parker recommendation concerning his f/u. He verbalized understanding. Please advise.

## 2015-07-01 NOTE — Telephone Encounter (Signed)
Please arrange for him to come into the office on September 21. He could come either 8:45 in the morning or at 11:45 in the morning

## 2015-07-01 NOTE — Telephone Encounter (Signed)
The pt accepted the 8:45 am appointment with Dr Ron Parker on 07/06/15.

## 2015-07-06 ENCOUNTER — Encounter: Payer: Self-pay | Admitting: Cardiology

## 2015-07-06 ENCOUNTER — Ambulatory Visit (INDEPENDENT_AMBULATORY_CARE_PROVIDER_SITE_OTHER): Payer: Medicare Other | Admitting: Cardiology

## 2015-07-06 VITALS — BP 118/60 | HR 84 | Ht 69.0 in | Wt 198.0 lb

## 2015-07-06 DIAGNOSIS — I481 Persistent atrial fibrillation: Secondary | ICD-10-CM

## 2015-07-06 DIAGNOSIS — Z7901 Long term (current) use of anticoagulants: Secondary | ICD-10-CM | POA: Diagnosis not present

## 2015-07-06 DIAGNOSIS — I421 Obstructive hypertrophic cardiomyopathy: Secondary | ICD-10-CM | POA: Diagnosis not present

## 2015-07-06 DIAGNOSIS — I4819 Other persistent atrial fibrillation: Secondary | ICD-10-CM

## 2015-07-06 NOTE — Assessment & Plan Note (Signed)
Historically the patient does have SAM of the mitral valve. His most recent echo in 2015 revealed a mild resting gradient. This is difficult to separate from his mild aortic stenosis. I've chosen not to do a follow-up echo at this time. When the patient is seen by the cardiologist taking over his care from me in 6 months, echo can be considered at that time.

## 2015-07-06 NOTE — Assessment & Plan Note (Signed)
The patient continues on Coumadin. No change in therapy.

## 2015-07-06 NOTE — Progress Notes (Signed)
Cardiology Office Note   Date:  07/06/2015   ID:  [name] [name], [name] [name] 06-03-1934, MRN [id number]  PCP:  [name], MD  Cardiologist:  Dola Argyle, MD   Chief Complaint  Patient presents with  . Appointment    Follow-up atrial fibrillation      History of Present Illness: [name] [name] is a 81 y.o. male who presents today to follow-up atrial fibrillation. He also has a history of some hypertrophic obstructive cardiomyopathy and mild aortic stenosis. He has never had significant symptoms from this. He is active. He volunteers regularly at [facility]. He is not having any chest pain or shortness of breath.  Past Medical History  Diagnosis Date  . HOCM (hypertrophic obstructive cardiomyopathy)     mild SAM of the mitral valve, but no LVOT obstrcution. echo 11/09 EF 55%. [place] [facility] -cho-11/11. mild regurgitation - echo - ``/09  . Head injury     when slipped on ice 2004-2005. stablaized and back on Coumadin   . Hypercholesterolemia     treated.   . Atrial fibrillation     and aflutter. pt has a left atrial circuit that is not ablated. was on amiodarone-stopped, now use rate control.   . Elevated liver enzymes     over time; hx  . Bladder cancer     hx; treated with BCG in past   . Diabetes mellitus     type not specified  . TSH elevation     on synthroid historically   . Homocystinemia     elevated, mild   . Glaucoma   . Gout   . Kidney mass     laproscopic surgery woth cryoablation of a mass outside kidney followed at [facility].   [name] Atherosclerosis of aorta     by CT scan in past  . Hypotension     orthostatic. dehydration. hospitalized 11/11  . Stroke     "2 old strokes" CT and MRI. [place] [facility] 11/11. diagnosis was dehydration, no acutal reports.   Kerman Passey gait     August, 2012  . Sleep apnea     Significant obstructive sleep apnea diagnosed in August, 2012, the patient is to receive CPAP   . Excessive sweating   . Nausea   .  Carotid artery disease     There was calcified plaque but no stenosis by carotid artery screening done at [facility] October, 2013  . Obstructive sleep apnea     CPAP started successfully 2014  . Ejection fraction   . Hypertension   . Dysrhythmia     A-Fib - Cardio visit 12/13/14 - chart review  . Headache     migraines - distant past  . Diabetic neuropathy     feet  . Motion sickness     ocean boats    Past Surgical History  Procedure Laterality Date  . Tonsillectomy    . Bladder surgery    . Kidney surgery      "froze mass"  . Cataract extraction w/phaco Left 05/11/2015    Procedure: CATARACT EXTRACTION PHACO AND INTRAOCULAR LENS PLACEMENT (IOC);  Surgeon: Leandrew Koyanagi, MD;  Location: [place];  Service: Ophthalmology;  Laterality: Left;  DIABETIC - oral meds, CPAP    Patient Active Problem List   Diagnosis Date Noted  . Arteriosclerosis of coronary artery 06/13/2015  . Carcinoma in situ of bladder 06/13/2015  . Diabetes mellitus, type 2 06/13/2015  . Diabetic neuropathy 06/13/2015  .  Dizziness 06/13/2015  . Polypharmacy 06/13/2015  . Essential (primary) hypertension 06/13/2015  . Fatigue 06/13/2015  . Glaucoma 06/13/2015  . Hypercholesteremia 06/13/2015  . Male hypogonadism 06/13/2015  . Adult hypothyroidism 06/13/2015  . Gonalgia 06/13/2015  . Does not feel right 06/13/2015  . Arthritis, degenerative 06/13/2015  . PNA (pneumonia) 06/13/2015  . Encounter for screening for malignant neoplasm of prostate 06/13/2015  . Adenocarcinoma, renal cell 06/13/2015  . Benign head tremor 12/13/2014  . Aortic atherosclerosis 07/12/2014  . Abnormal liver enzymes 07/12/2014  . Carotid arterial disease 07/12/2014  . Cerebral vascular accident 07/12/2014  . Decreased cardiac output 07/12/2014  . Cardiomyopathy, hypertrophic obstructive 07/12/2014  . Elevated TSH 07/12/2014  . Head injuries 07/12/2014  . Obstructive apnea 07/12/2014  . Kidney  lump 07/12/2014  . Ejection fraction   . HOCM (hypertrophic obstructive cardiomyopathy)   . Encounter for therapeutic drug monitoring 11/16/2013  . Obstructive sleep apnea   . Carotid artery disease   . Benign prostatic hypertrophy without urinary obstruction 06/09/2012  . H/O urinary disorder 06/09/2012  . History of neoplasm of bladder 06/09/2012  . Head injury   . Elevated liver enzymes   . TSH elevation   . Kidney mass   . Atherosclerosis of aorta   . Hypotension   . Stroke   . Unsteady gait   . Sleep apnea   . Excessive sweating   . Nausea   . ORTHOSTATIC HYPOTENSION, HX OF 09/01/2010  . H/O cardiovascular disorder 09/01/2010  . Overweight(278.02) 03/07/2009  . AODM 03/05/2009  . HYPERLIPIDEMIA 03/05/2009  . MITRAL REGURGITATION 03/05/2009  . Chronic atrial fibrillation 03/05/2009  . Atrial flutter 03/05/2009  . A-fib 03/05/2009  . Mitral and aortic incompetence 03/05/2009      Current Outpatient Prescriptions  Medication Sig Dispense Refill  . BETA CAROTENE PO Take 1 capsule by mouth daily.    [name] BIMATOPROST OP Apply 1 drop to eye every evening.    . Calcium-Vitamin D 600-200 MG-UNIT per tablet Take 1 tablet by mouth daily.      [name] co-enzyme Q-10 30 MG capsule Take 30 mg by mouth daily.      . digoxin (LANOXIN) 0.125 MG tablet TAKE ONE TABLET BY MOUTH ONCE DAILY 90 tablet 1  . fish oil-omega-3 fatty acids 1000 MG capsule Take 2 g by mouth 2 (two) times daily.      . Gelatin Capsules, Empty, CAPS 4 capsules by Does not apply route 2 (two) times daily.      . Glucosamine 500 MG CAPS Take 1 capsule by mouth daily.      [name] glucose blood test strip     . latanoprost (XALATAN) 0.005 % ophthalmic solution Place 1 drop into both eyes at bedtime.     [name] levothyroxine (SYNTHROID, LEVOTHROID) 50 MCG tablet Take 1 tablet (50 mcg total) by mouth daily. 90 tablet 3  . lidocaine (XYLOCAINE) 2 % solution Use as directed 20 mLs in the mouth or throat as needed for mouth pain. 100 mL 0    . metFORMIN (GLUCOPHAGE) 500 MG tablet Take 500 mg by mouth daily.      . Multiple Vitamin (MULTIVITAMIN) tablet Take 1 tablet by mouth daily.      . ONE TOUCH ULTRA TEST test strip     . ONETOUCH DELICA LANCETS 67M MISC     . POTASSIUM PO Take 1 tablet by mouth daily.      . simvastatin (ZOCOR) 40 MG tablet Take 0.5 tablets (20 mg  total) by mouth at bedtime. 45 tablet 3  . verapamil (CALAN-SR) 240 MG CR tablet TAKE ONE TABLET BY MOUTH ONCE DAILY 180 tablet 0  . vitamin B-12 (CYANOCOBALAMIN) 500 MCG tablet Take 500 mcg by mouth daily.      . vitamin C (ASCORBIC ACID) 500 MG tablet Take 500 mg by mouth daily.      . vitamin E 600 UNIT capsule Take 600 Units by mouth daily.      [name] warfarin (COUMADIN) 5 MG tablet 1 pill everyday except 1/2 pill on Sundays and Wednesdays or as directed 90 tablet 1   No current facility-administered medications for this visit.    Allergies:   Macrolides and ketolides; Mycinettes; Nitrofuran derivatives; Nitrofurantoin; and Erythromycin    Social History:  The patient  reports that he quit smoking about 40 years ago. His smoking use included Cigars. He does not have any smokeless tobacco history on file. He reports that he drinks alcohol. He reports that he does not use illicit drugs.   Family History:  The patient's family history is negative for Heart attack, Hypertension, and Stroke.    ROS:  Please see the history of present illness.     Patient denies fever, chills, headache, sweats, rash, change in vision, change in hearing, chest pain, cough, nausea vomiting, urinary symptoms. All other systems are reviewed and are negative.   PHYSICAL EXAM: VS:  BP 118/60 mmHg  Pulse 84  Ht 5\' 9"  (1.753 m)  Wt 198 lb (89.812 kg)  BMI 29.23 kg/m2 , Patient is oriented to person time and place. Affect is normal. Head is atraumatic. Sclera and conjunctiva are normal. There is no jugular venous distention. Lungs are clear. Respiratory effort is not labored. Cardiac  exam reveals a 3/6 crescendo decrescendo systolic murmur. The abdomen is soft. There is no peripheral edema. There are no musculoskeletal deformities. There are no skin rashes. Neurologic is grossly intact.   EKG:   EKG is done today and reviewed by me. There is atrial fibrillation with a controlled rate. There is an interventricular conduction delay. There are diffuse ST-T wave changes that are unchanged from the past.   Recent Labs: No results found for requested labs within last 365 days.    Lipid Panel No results found for: CHOL, TRIG, HDL, CHOLHDL, VLDL, LDLCALC, LDLDIRECT    Wt Readings from Last 3 Encounters:  07/06/15 198 lb (89.812 kg)  06/13/15 196 lb 12.8 oz (89.268 kg)  06/13/15 190 lb (86.183 kg)      Current medicines are reviewed       ASSESSMENT AND PLAN:

## 2015-07-06 NOTE — Assessment & Plan Note (Signed)
The patient has chronic atrial fibrillation. Rate is controlled. He is anticoagulated. 

## 2015-07-06 NOTE — Patient Instructions (Signed)
**Note De-Identified  Obfuscation** Medication Instructions:  Same-no change  Labwork: None  Testing/Procedures: None  Follow-Up: Your physician wants you to follow-up in: 6 months with Dr Acie Fredrickson in the [place] office. You will receive a reminder letter in the mail two months in advance. If you don't receive a letter, please call our office to schedule the follow-up appointment.

## 2015-07-20 ENCOUNTER — Ambulatory Visit (INDEPENDENT_AMBULATORY_CARE_PROVIDER_SITE_OTHER): Payer: Medicare Other

## 2015-07-20 DIAGNOSIS — I4891 Unspecified atrial fibrillation: Secondary | ICD-10-CM | POA: Diagnosis not present

## 2015-07-20 DIAGNOSIS — I482 Chronic atrial fibrillation, unspecified: Secondary | ICD-10-CM

## 2015-07-20 DIAGNOSIS — Z5181 Encounter for therapeutic drug level monitoring: Secondary | ICD-10-CM | POA: Diagnosis not present

## 2015-07-20 DIAGNOSIS — I4892 Unspecified atrial flutter: Secondary | ICD-10-CM | POA: Diagnosis not present

## 2015-07-20 LAB — POCT INR: INR: 2.2

## 2015-08-09 DIAGNOSIS — Z8551 Personal history of malignant neoplasm of bladder: Secondary | ICD-10-CM | POA: Diagnosis not present

## 2015-08-17 ENCOUNTER — Ambulatory Visit (INDEPENDENT_AMBULATORY_CARE_PROVIDER_SITE_OTHER): Payer: Medicare Other

## 2015-08-17 DIAGNOSIS — Z5181 Encounter for therapeutic drug level monitoring: Secondary | ICD-10-CM

## 2015-08-17 DIAGNOSIS — I4891 Unspecified atrial fibrillation: Secondary | ICD-10-CM | POA: Diagnosis not present

## 2015-08-17 DIAGNOSIS — I482 Chronic atrial fibrillation, unspecified: Secondary | ICD-10-CM

## 2015-08-17 DIAGNOSIS — I4892 Unspecified atrial flutter: Secondary | ICD-10-CM

## 2015-08-17 LAB — POCT INR: INR: 2.1

## 2015-08-24 DIAGNOSIS — B351 Tinea unguium: Secondary | ICD-10-CM | POA: Diagnosis not present

## 2015-08-24 DIAGNOSIS — E119 Type 2 diabetes mellitus without complications: Secondary | ICD-10-CM | POA: Diagnosis not present

## 2015-08-26 ENCOUNTER — Other Ambulatory Visit: Payer: Self-pay

## 2015-08-26 MED ORDER — SIMVASTATIN 40 MG PO TABS: 20.0000 mg | ORAL_TABLET | Freq: Every day | ORAL | Status: DC

## 2015-09-21 ENCOUNTER — Telehealth: Payer: Self-pay | Admitting: Cardiology

## 2015-09-21 ENCOUNTER — Other Ambulatory Visit: Payer: Self-pay | Admitting: Cardiology

## 2015-09-21 NOTE — Telephone Encounter (Signed)
Pt needs refill of verapamil called to [address] [place], only has two pills left has been out of town-was referred to Dr Fletcher Anon but appt not until March, if any questions call pt  [phone]

## 2015-09-28 ENCOUNTER — Ambulatory Visit (INDEPENDENT_AMBULATORY_CARE_PROVIDER_SITE_OTHER): Payer: Medicare Other

## 2015-09-28 DIAGNOSIS — I4891 Unspecified atrial fibrillation: Secondary | ICD-10-CM | POA: Diagnosis not present

## 2015-09-28 DIAGNOSIS — I4892 Unspecified atrial flutter: Secondary | ICD-10-CM

## 2015-09-28 DIAGNOSIS — Z5181 Encounter for therapeutic drug level monitoring: Secondary | ICD-10-CM | POA: Diagnosis not present

## 2015-09-28 DIAGNOSIS — I482 Chronic atrial fibrillation, unspecified: Secondary | ICD-10-CM

## 2015-09-28 LAB — POCT INR: INR: 2

## 2015-10-04 ENCOUNTER — Other Ambulatory Visit: Payer: Self-pay | Admitting: Family Medicine

## 2015-10-31 ENCOUNTER — Telehealth: Payer: Self-pay

## 2015-10-31 NOTE — Telephone Encounter (Signed)
OK to reschedule 6 week follow-up appt to 11/02/15.  Attempted to call and notify pt, LMOM OK to reschedule call [place] office to r/s.

## 2015-10-31 NOTE — Telephone Encounter (Signed)
Patient wants to go out of town .  Can He move his Coum check up a week to 18th?  Please call or let me know if ok and I will call to reschedule.

## 2015-11-01 ENCOUNTER — Other Ambulatory Visit: Payer: Self-pay | Admitting: Cardiology

## 2015-11-02 ENCOUNTER — Ambulatory Visit (INDEPENDENT_AMBULATORY_CARE_PROVIDER_SITE_OTHER): Payer: PPO | Admitting: *Deleted

## 2015-11-02 DIAGNOSIS — I482 Chronic atrial fibrillation, unspecified: Secondary | ICD-10-CM

## 2015-11-02 DIAGNOSIS — Z5181 Encounter for therapeutic drug level monitoring: Secondary | ICD-10-CM | POA: Diagnosis not present

## 2015-11-02 DIAGNOSIS — I4891 Unspecified atrial fibrillation: Secondary | ICD-10-CM

## 2015-11-02 DIAGNOSIS — I4892 Unspecified atrial flutter: Secondary | ICD-10-CM | POA: Diagnosis not present

## 2015-11-02 LAB — POCT INR: INR: 2

## 2015-11-23 DIAGNOSIS — B351 Tinea unguium: Secondary | ICD-10-CM | POA: Diagnosis not present

## 2015-11-23 DIAGNOSIS — E119 Type 2 diabetes mellitus without complications: Secondary | ICD-10-CM | POA: Diagnosis not present

## 2015-11-23 DIAGNOSIS — L851 Acquired keratosis [keratoderma] palmaris et plantaris: Secondary | ICD-10-CM | POA: Diagnosis not present

## 2015-12-14 ENCOUNTER — Ambulatory Visit (INDEPENDENT_AMBULATORY_CARE_PROVIDER_SITE_OTHER): Payer: PPO

## 2015-12-14 DIAGNOSIS — I4892 Unspecified atrial flutter: Secondary | ICD-10-CM | POA: Diagnosis not present

## 2015-12-14 DIAGNOSIS — Z5181 Encounter for therapeutic drug level monitoring: Secondary | ICD-10-CM

## 2015-12-14 DIAGNOSIS — I4891 Unspecified atrial fibrillation: Secondary | ICD-10-CM

## 2015-12-14 DIAGNOSIS — I482 Chronic atrial fibrillation, unspecified: Secondary | ICD-10-CM

## 2015-12-14 LAB — POCT INR: INR: 1.9

## 2015-12-15 ENCOUNTER — Other Ambulatory Visit: Payer: Self-pay | Admitting: Family Medicine

## 2015-12-28 DIAGNOSIS — H401131 Primary open-angle glaucoma, bilateral, mild stage: Secondary | ICD-10-CM | POA: Diagnosis not present

## 2016-01-02 ENCOUNTER — Encounter: Payer: Self-pay | Admitting: Cardiovascular Disease

## 2016-01-02 ENCOUNTER — Ambulatory Visit (INDEPENDENT_AMBULATORY_CARE_PROVIDER_SITE_OTHER): Payer: PPO | Admitting: Cardiovascular Disease

## 2016-01-02 VITALS — BP 110/62 | HR 72 | Ht 69.0 in | Wt 195.0 lb

## 2016-01-02 DIAGNOSIS — I482 Chronic atrial fibrillation, unspecified: Secondary | ICD-10-CM

## 2016-01-02 DIAGNOSIS — I350 Nonrheumatic aortic (valve) stenosis: Secondary | ICD-10-CM

## 2016-01-02 DIAGNOSIS — I2510 Atherosclerotic heart disease of native coronary artery without angina pectoris: Secondary | ICD-10-CM | POA: Diagnosis not present

## 2016-01-02 DIAGNOSIS — I10 Essential (primary) hypertension: Secondary | ICD-10-CM | POA: Diagnosis not present

## 2016-01-02 NOTE — Progress Notes (Signed)
Cardiology Office Note   Date:  01/02/2016   ID:  [name] [name], [name] [name] 1934/02/03, MRN [id number]  PCP:  [name], MD  Cardiologist:   Kathlyn Sacramento, MD   Chief Complaint  Patient presents with  . other    Former patient of Dr. Ron Parker; pt. establish care locally. Meds reviewed by the patient verblaly. "doing well."       History of Present Illness: [name] [name] is a 82 y.o. male who presents for For a follow-up visit and to establish with me as he is a former patient of Dr. Ron Parker. He has known history of chronic atrial fibrillation on long-term anticoagulation ,mild hypertrophic obstructive cardiomyopathy and mild aortic stenosis.  He is active. He volunteers regularly at [facility].  He has been doing very well and denies any chest pain, palpitations, dizziness or syncope. He reports stable exertional dyspnea.  Past Medical History  Diagnosis Date  . HOCM (hypertrophic obstructive cardiomyopathy) (HCC)     mild SAM of the mitral valve, but no LVOT obstrcution. echo 11/09 EF 55%. [place] [facility] -cho-11/11. mild regurgitation - echo - ``/09  . Head injury     when slipped on ice 2004-2005. stablaized and back on Coumadin   . Hypercholesterolemia     treated.   . Atrial fibrillation ([place])     and aflutter. pt has a left atrial circuit that is not ablated. was on amiodarone-stopped, now use rate control.   . Elevated liver enzymes     over time; hx  . Bladder cancer (HCC)     hx; treated with BCG in past   . Diabetes mellitus     type not specified  . TSH elevation     on synthroid historically   . Homocystinemia ([place])     elevated, mild   . Glaucoma   . Gout   . Kidney mass     laproscopic surgery woth cryoablation of a mass outside kidney followed at [facility].   [name] Atherosclerosis of aorta ([place])     by CT scan in past  . Hypotension     orthostatic. dehydration. hospitalized 11/11  . Stroke [facility])     "2 old strokes" CT and MRI. [place]  [facility] 11/11. diagnosis was dehydration, no acutal reports.   Kerman Passey gait     August, 2012  . Sleep apnea     Significant obstructive sleep apnea diagnosed in August, 2012, the patient is to receive CPAP   . Excessive sweating   . Nausea   . Carotid artery disease (HCC)     There was calcified plaque but no stenosis by carotid artery screening done at [facility] October, 2013  . Obstructive sleep apnea     CPAP started successfully 2014  . Ejection fraction   . Hypertension   . Dysrhythmia     A-Fib - Cardio visit 12/13/14 - chart review  . Headache     migraines - distant past  . Diabetic neuropathy (HCC)     feet  . Motion sickness     ocean boats    Past Surgical History  Procedure Laterality Date  . Tonsillectomy    . Bladder surgery    . Kidney surgery      "froze mass"  . Cataract extraction w/phaco Left 05/11/2015    Procedure: CATARACT EXTRACTION PHACO AND INTRAOCULAR LENS PLACEMENT (IOC);  Surgeon: Leandrew Koyanagi, MD;  Location: [place];  Service: Ophthalmology;  Laterality: Left;  DIABETIC - oral meds, CPAP     Current Outpatient Prescriptions  Medication Sig Dispense Refill  . BETA CAROTENE PO Take 1 capsule by mouth daily.    [name] BIMATOPROST OP Apply 1 drop to eye every evening.    . Calcium-Vitamin D 600-200 MG-UNIT per tablet Take 1 tablet by mouth daily.      [name] co-enzyme Q-10 30 MG capsule Take 30 mg by mouth daily.      . digoxin (LANOXIN) 0.125 MG tablet TAKE ONE TABLET BY MOUTH ONCE DAILY 90 tablet 1  . fish oil-omega-3 fatty acids 1000 MG capsule Take 2 g by mouth 2 (two) times daily.      . Gelatin Capsules, Empty, CAPS 4 capsules by Does not apply route 2 (two) times daily.      . Glucosamine 500 MG CAPS Take 1 capsule by mouth daily.      [name] glucose blood test strip     . latanoprost (XALATAN) 0.005 % ophthalmic solution Place 1 drop into both eyes at bedtime.     [name] levothyroxine (SYNTHROID, LEVOTHROID) 50 MCG  tablet Take 1 tablet (50 mcg total) by mouth daily. 90 tablet 3  . lidocaine (XYLOCAINE) 2 % solution Use as directed 20 mLs in the mouth or throat as needed for mouth pain. 100 mL 0  . metFORMIN (GLUCOPHAGE) 500 MG tablet TAKE ONE TABLET BY MOUTH ONCE DAILY 90 tablet 0  . Multiple Vitamin (MULTIVITAMIN) tablet Take 1 tablet by mouth daily.      . ONE TOUCH ULTRA TEST test strip     . ONETOUCH DELICA LANCETS 99991111 MISC USE TO TEST BLOOD SUGAR ONCE DAILY 100 each 12  . POTASSIUM PO Take 1 tablet by mouth daily.      . simvastatin (ZOCOR) 40 MG tablet Take 0.5 tablets (20 mg total) by mouth at bedtime. 45 tablet 3  . verapamil (CALAN-SR) 240 MG CR tablet TAKE ONE TABLET BY MOUTH ONCE DAILY 180 tablet 1  . vitamin B-12 (CYANOCOBALAMIN) 500 MCG tablet Take 500 mcg by mouth daily.      . vitamin C (ASCORBIC ACID) 500 MG tablet Take 500 mg by mouth daily.      . vitamin E 600 UNIT capsule Take 600 Units by mouth daily.      [name] warfarin (COUMADIN) 5 MG tablet TAKE ONE TABLET BY MOUTH ONCE DAILY EXCEPT TAKE ONE-HALF TABLET BY MOUTH ON SUNDAYS AND WEDNESDAYS AS DIRECTED 90 tablet 0   No current facility-administered medications for this visit.    Allergies:   Macrolides and ketolides; Mycinettes; Nitrofuran derivatives; Nitrofurantoin; and Erythromycin    Social History:  The patient  reports that he quit smoking about 41 years ago. His smoking use included Cigars. He does not have any smokeless tobacco history on file. He reports that he drinks alcohol. He reports that he does not use illicit drugs.   Family History:  The patient's family history includes Arrhythmia in his brother and father. There is no history of Heart attack, Hypertension, or Stroke.    ROS:  Please see the history of present illness.   Otherwise, review of systems are positive for none.   All other systems are reviewed and negative.    PHYSICAL EXAM: VS:  BP 110/62 mmHg  Pulse 72  Ht 5\' 9"  (1.753 m)  Wt 195 lb (88.451 kg)   BMI 28.78 kg/m2 , BMI Body mass index is 28.78 kg/(m^2). GEN: Well nourished, well developed, in no acute distress  HEENT: normal Neck: no JVD, carotid bruits, or masses Cardiac: Irregularly irregular; no rubs, or gallops,no edema . There is 3/6 crescendo decrescendo murmur in the aortic area which is mid peaking with mildly diminished S2. Respiratory:  clear to auscultation bilaterally, normal work of breathing GI: soft, nontender, nondistended, + BS MS: no deformity or atrophy Skin: warm and dry, no rash Neuro:  Strength and sensation are intact Psych: euthymic mood, full affect   EKG:  EKG is ordered today. The ekg ordered today demonstrates atrial fibrillation, left ventricular hypertrophy with repolarization abnormalities.   Recent Labs: No results found for requested labs within last 365 days.    Lipid Panel No results found for: CHOL, TRIG, HDL, CHOLHDL, VLDL, LDLCALC, LDLDIRECT    Wt Readings from Last 3 Encounters:  01/02/16 195 lb (88.451 kg)  07/06/15 198 lb (89.812 kg)  06/13/15 196 lb 12.8 oz (89.268 kg)        ASSESSMENT AND PLAN:  1.  Chronic atrial fibrillation: He is doing well overall with no significant symptoms related to this. Continue rate control with verapamil and small dose digoxin. He is on long-term anticoagulation with warfarin for many years. He is going to have a visit with Dr. Rosanna Randy in the near future. He will probably get labs done there. We need to make sure that his renal function and CBC are unremarkable given that he is on anticoagulation and on digoxin.  2. Aortic stenosis: He had previous echocardiogram in 2015 which showed mild aortic stenosis. The murmur today is suggestive of at least moderate aortic stenosis and thus I requested an echocardiogram.  3. Type 2 diabetes: Managed by Dr. Rosanna Randy  4. Hyperlipidemia currently on simvastatin 20 mg daily. Recommend a target LDL of less than 70 given that he is diabetic.    Disposition:    FU with me in 6 months  Signed,  Kathlyn Sacramento, MD  01/02/2016 6:28 PM    [place]

## 2016-01-02 NOTE — Patient Instructions (Signed)
Medication Instructions:  Your physician recommends that you continue on your current medications as directed. Please refer to the Current Medication list given to you today.  Labwork: none  Testing/Procedures: Your physician has requested that you have an echocardiogram. Echocardiography is a painless test that uses sound waves to create images of your heart. It provides your doctor with information about the size and shape of your heart and how well your heart's chambers and valves are working. This procedure takes approximately one hour. There are no restrictions for this procedure.    Follow-Up: Your physician wants you to follow-up in: six months with Dr. Arida.  You will receive a reminder letter in the mail two months in advance. If you don't receive a letter, please call our office to schedule the follow-up appointment.   Any Other Special Instructions Will Be Listed Below (If Applicable).     If you need a refill on your cardiac medications before your next appointment, please call your pharmacy.  Echocardiogram An echocardiogram, or echocardiography, uses sound waves (ultrasound) to produce an image of your heart. The echocardiogram is simple, painless, obtained within a short period of time, and offers valuable information to your health care provider. The images from an echocardiogram can provide information such as:  Evidence of coronary artery disease (CAD).  Heart size.  Heart muscle function.  Heart valve function.  Aneurysm detection.  Evidence of a past heart attack.  Fluid buildup around the heart.  Heart muscle thickening.  Assess heart valve function. LET YOUR HEALTH CARE PROVIDER KNOW ABOUT:  Any allergies you have.  All medicines you are taking, including vitamins, herbs, eye drops, creams, and over-the-counter medicines.  Previous problems you or members of your family have had with the use of anesthetics.  Any blood disorders you  have.  Previous surgeries you have had.  Medical conditions you have.  Possibility of pregnancy, if this applies. BEFORE THE PROCEDURE  No special preparation is needed. Eat and drink normally.  PROCEDURE   In order to produce an image of your heart, gel will be applied to your chest and a wand-like tool (transducer) will be moved over your chest. The gel will help transmit the sound waves from the transducer. The sound waves will harmlessly bounce off your heart to allow the heart images to be captured in real-time motion. These images will then be recorded.  You may need an IV to receive a medicine that improves the quality of the pictures. AFTER THE PROCEDURE You may return to your normal schedule including diet, activities, and medicines, unless your health care provider tells you otherwise.   This information is not intended to replace advice given to you by your health care provider. Make sure you discuss any questions you have with your health care provider.   Document Released: 09/28/2000 Document Revised: 10/22/2014 Document Reviewed: 06/08/2013 Elsevier Interactive Patient Education 2016 [employer] Inc[employer]  

## 2016-01-04 ENCOUNTER — Other Ambulatory Visit: Payer: Self-pay | Admitting: Family Medicine

## 2016-01-04 ENCOUNTER — Other Ambulatory Visit: Payer: Self-pay | Admitting: Cardiology

## 2016-01-06 DIAGNOSIS — H401131 Primary open-angle glaucoma, bilateral, mild stage: Secondary | ICD-10-CM | POA: Diagnosis not present

## 2016-01-06 LAB — HM DIABETES EYE EXAM

## 2016-01-20 ENCOUNTER — Other Ambulatory Visit: Payer: Self-pay

## 2016-01-20 ENCOUNTER — Ambulatory Visit (INDEPENDENT_AMBULATORY_CARE_PROVIDER_SITE_OTHER): Payer: PPO

## 2016-01-20 DIAGNOSIS — I350 Nonrheumatic aortic (valve) stenosis: Secondary | ICD-10-CM

## 2016-01-21 ENCOUNTER — Ambulatory Visit (INDEPENDENT_AMBULATORY_CARE_PROVIDER_SITE_OTHER): Payer: PPO | Admitting: Family Medicine

## 2016-01-21 ENCOUNTER — Encounter: Payer: Self-pay | Admitting: Family Medicine

## 2016-01-21 VITALS — BP 138/52 | HR 98 | Temp 99.5°F | Resp 18 | Wt 192.2 lb

## 2016-01-21 DIAGNOSIS — R509 Fever, unspecified: Secondary | ICD-10-CM

## 2016-01-21 DIAGNOSIS — B349 Viral infection, unspecified: Secondary | ICD-10-CM | POA: Diagnosis not present

## 2016-01-21 LAB — POCT INFLUENZA A/B
INFLUENZA B, POC: NEGATIVE
Influenza A, POC: NEGATIVE

## 2016-01-21 MED ORDER — OSELTAMIVIR PHOSPHATE 75 MG PO CAPS: 75.0000 mg | ORAL_CAPSULE | Freq: Two times a day (BID) | ORAL | Status: DC

## 2016-01-21 NOTE — Patient Instructions (Addendum)
Discussed use of Delsym and mucinex  for cough.

## 2016-01-21 NOTE — Progress Notes (Signed)
Subjective:     Patient ID: [name] [name], male   DOB: 02-13-1934, 82 y.o.   MRN: [id number]  HPI  Chief Complaint  Patient presents with  . Fever    Patient comes in office today with conerns of flu like symptoms for less than 24hrs. Patient reports that his temperature last night got up to a high of 100.1, he also states associated he had body aches and productive cough. Patient has taken otc Tylenol for fever reducer and body ache  States he had the flu shot this season. Accompanied by his wife today.   Review of Systems     Objective:   Physical Exam  Constitutional: He appears well-developed and well-nourished. No distress.  Ears: T.M's intact without inflammation/excessive cerumen in the right ear Throat: tonsils absent Neck: no cervical adenopathy Lungs: clear     Assessment:    1. Fever, unspecified fever cause - POCT Influenza A/B  2. Viral syndrome: will cover for influenza - oseltamivir (TAMIFLU) 75 MG capsule; Take 1 capsule (75 mg total) by mouth 2 (two) times daily.  Dispense: 10 capsule; Refill: 0    Plan:    Discussed use of Delsym and Mucinex.

## 2016-01-22 ENCOUNTER — Other Ambulatory Visit: Payer: Self-pay | Admitting: Family Medicine

## 2016-01-24 DIAGNOSIS — Z1283 Encounter for screening for malignant neoplasm of skin: Secondary | ICD-10-CM | POA: Diagnosis not present

## 2016-01-24 DIAGNOSIS — L821 Other seborrheic keratosis: Secondary | ICD-10-CM | POA: Diagnosis not present

## 2016-01-25 ENCOUNTER — Ambulatory Visit (INDEPENDENT_AMBULATORY_CARE_PROVIDER_SITE_OTHER): Payer: PPO

## 2016-01-25 DIAGNOSIS — I4892 Unspecified atrial flutter: Secondary | ICD-10-CM | POA: Diagnosis not present

## 2016-01-25 DIAGNOSIS — I4891 Unspecified atrial fibrillation: Secondary | ICD-10-CM

## 2016-01-25 DIAGNOSIS — I482 Chronic atrial fibrillation, unspecified: Secondary | ICD-10-CM

## 2016-01-25 DIAGNOSIS — Z5181 Encounter for therapeutic drug level monitoring: Secondary | ICD-10-CM

## 2016-01-25 LAB — POCT INR: INR: 2.1

## 2016-02-09 ENCOUNTER — Telehealth: Payer: Self-pay | Admitting: Cardiovascular Disease

## 2016-02-09 NOTE — Telephone Encounter (Signed)
Stable echo . Aortic stenosis is worse than before but still moderate. Continue same medications.         Left message on machine for patient to contact the office.

## 2016-02-12 DIAGNOSIS — H6121 Impacted cerumen, right ear: Secondary | ICD-10-CM | POA: Diagnosis not present

## 2016-02-13 NOTE — Telephone Encounter (Signed)
Reviewed results and recommendations w/pt who verbalized understanding. Pt had no further questions.

## 2016-02-22 DIAGNOSIS — B351 Tinea unguium: Secondary | ICD-10-CM | POA: Diagnosis not present

## 2016-02-22 DIAGNOSIS — E119 Type 2 diabetes mellitus without complications: Secondary | ICD-10-CM | POA: Diagnosis not present

## 2016-02-27 ENCOUNTER — Other Ambulatory Visit: Payer: Self-pay | Admitting: Cardiology

## 2016-03-07 ENCOUNTER — Ambulatory Visit (INDEPENDENT_AMBULATORY_CARE_PROVIDER_SITE_OTHER): Payer: PPO | Admitting: *Deleted

## 2016-03-07 DIAGNOSIS — Z5181 Encounter for therapeutic drug level monitoring: Secondary | ICD-10-CM | POA: Diagnosis not present

## 2016-03-07 DIAGNOSIS — I482 Chronic atrial fibrillation, unspecified: Secondary | ICD-10-CM

## 2016-03-07 DIAGNOSIS — I4891 Unspecified atrial fibrillation: Secondary | ICD-10-CM | POA: Diagnosis not present

## 2016-03-07 DIAGNOSIS — I4892 Unspecified atrial flutter: Secondary | ICD-10-CM | POA: Diagnosis not present

## 2016-03-07 LAB — POCT INR: INR: 1.9

## 2016-04-25 ENCOUNTER — Ambulatory Visit (INDEPENDENT_AMBULATORY_CARE_PROVIDER_SITE_OTHER): Payer: PPO | Admitting: *Deleted

## 2016-04-25 DIAGNOSIS — I4891 Unspecified atrial fibrillation: Secondary | ICD-10-CM

## 2016-04-25 DIAGNOSIS — Z5181 Encounter for therapeutic drug level monitoring: Secondary | ICD-10-CM

## 2016-04-25 DIAGNOSIS — I4892 Unspecified atrial flutter: Secondary | ICD-10-CM

## 2016-04-25 DIAGNOSIS — I482 Chronic atrial fibrillation, unspecified: Secondary | ICD-10-CM

## 2016-04-25 LAB — POCT INR: INR: 2.2

## 2016-05-30 ENCOUNTER — Ambulatory Visit: Payer: PPO | Admitting: Family Medicine

## 2016-05-30 DIAGNOSIS — L851 Acquired keratosis [keratoderma] palmaris et plantaris: Secondary | ICD-10-CM | POA: Diagnosis not present

## 2016-05-30 DIAGNOSIS — B351 Tinea unguium: Secondary | ICD-10-CM | POA: Diagnosis not present

## 2016-05-30 DIAGNOSIS — E119 Type 2 diabetes mellitus without complications: Secondary | ICD-10-CM | POA: Diagnosis not present

## 2016-05-30 DIAGNOSIS — K1120 Sialoadenitis, unspecified: Secondary | ICD-10-CM | POA: Diagnosis not present

## 2016-06-13 ENCOUNTER — Ambulatory Visit (INDEPENDENT_AMBULATORY_CARE_PROVIDER_SITE_OTHER): Payer: PPO | Admitting: *Deleted

## 2016-06-13 DIAGNOSIS — I4891 Unspecified atrial fibrillation: Secondary | ICD-10-CM | POA: Diagnosis not present

## 2016-06-13 DIAGNOSIS — I482 Chronic atrial fibrillation, unspecified: Secondary | ICD-10-CM

## 2016-06-13 DIAGNOSIS — Z5181 Encounter for therapeutic drug level monitoring: Secondary | ICD-10-CM

## 2016-06-13 DIAGNOSIS — I4892 Unspecified atrial flutter: Secondary | ICD-10-CM | POA: Diagnosis not present

## 2016-06-13 LAB — POCT INR: INR: 2.1

## 2016-06-14 ENCOUNTER — Other Ambulatory Visit: Payer: Self-pay | Admitting: Family Medicine

## 2016-06-14 DIAGNOSIS — E039 Hypothyroidism, unspecified: Secondary | ICD-10-CM

## 2016-06-26 ENCOUNTER — Other Ambulatory Visit: Payer: Self-pay | Admitting: Cardiovascular Disease

## 2016-06-26 NOTE — Telephone Encounter (Signed)
Review for refill, Thank you. 

## 2016-07-09 ENCOUNTER — Encounter: Payer: Self-pay | Admitting: Cardiovascular Disease

## 2016-07-09 ENCOUNTER — Other Ambulatory Visit
Admission: RE | Admit: 2016-07-09 | Discharge: 2016-07-09 | Disposition: A | Discharge status: Home / Self Care | Payer: PPO | Source: Ambulatory Visit | Attending: Cardiovascular Disease | Admitting: Cardiovascular Disease

## 2016-07-09 ENCOUNTER — Ambulatory Visit (INDEPENDENT_AMBULATORY_CARE_PROVIDER_SITE_OTHER): Payer: PPO | Admitting: Cardiovascular Disease

## 2016-07-09 VITALS — BP 120/58 | HR 76 | Ht 69.0 in | Wt 193.5 lb

## 2016-07-09 DIAGNOSIS — E785 Hyperlipidemia, unspecified: Secondary | ICD-10-CM | POA: Diagnosis not present

## 2016-07-09 DIAGNOSIS — I481 Persistent atrial fibrillation: Secondary | ICD-10-CM | POA: Diagnosis not present

## 2016-07-09 DIAGNOSIS — I421 Obstructive hypertrophic cardiomyopathy: Secondary | ICD-10-CM | POA: Diagnosis not present

## 2016-07-09 DIAGNOSIS — H35371 Puckering of macula, right eye: Secondary | ICD-10-CM | POA: Diagnosis not present

## 2016-07-09 DIAGNOSIS — I359 Nonrheumatic aortic valve disorder, unspecified: Secondary | ICD-10-CM | POA: Diagnosis not present

## 2016-07-09 DIAGNOSIS — I482 Chronic atrial fibrillation, unspecified: Secondary | ICD-10-CM

## 2016-07-09 DIAGNOSIS — I4819 Other persistent atrial fibrillation: Secondary | ICD-10-CM

## 2016-07-09 LAB — BASIC METABOLIC PANEL
Anion gap: 6 (ref 5–15)
BUN: 18 mg/dL (ref 6–20)
CALCIUM: 8.8 mg/dL — AB (ref 8.9–10.3)
CO2: 25 mmol/L (ref 22–32)
CREATININE: 1.04 mg/dL (ref 0.61–1.24)
Chloride: 107 mmol/L (ref 101–111)
GFR calc Af Amer: >60 mL/min (ref >60)
GFR calc non Af Amer: >60 mL/min (ref >60)
GLUCOSE: 103 mg/dL — AB (ref 65–99)
Potassium: 4.3 mmol/L (ref 3.5–5.1)
SODIUM: 138 mmol/L (ref 135–145)

## 2016-07-09 LAB — CBC
HEMATOCRIT: 39.9 % — AB (ref 40.0–52.0)
HEMOGLOBIN: 14 g/dL (ref 13.0–18.0)
MCH: 31.8 pg (ref 26.0–34.0)
MCHC: 35 g/dL (ref 32.0–36.0)
MCV: 90.6 fL (ref 80.0–100.0)
Platelets: 182 10*3/uL (ref 150–440)
RBC: 4.4 MIL/uL (ref 4.40–5.90)
RDW: 14.1 % (ref 11.5–14.5)
WBC: 9.3 10*3/uL (ref 3.8–10.6)

## 2016-07-09 LAB — DIGOXIN LEVEL: Digoxin Level: 0.5 ng/mL — ABNORMAL LOW (ref 0.8–2.0)

## 2016-07-09 LAB — TSH: TSH: 2.764 u[IU]/mL (ref 0.350–4.500)

## 2016-07-09 NOTE — Progress Notes (Signed)
Cardiology Office Note   Date:  07/09/2016   ID:  [name], [name] 1934-07-02, MRN [id number]  PCP:  [name] Durie, MD  Cardiologist:   Kathlyn Sacramento, MD   Chief Complaint  Patient presents with  . other    6 month follow up. Meds reviewed by the pt. verbally. "doing well."       History of Present Illness: [name] [name] is a 82 y.o. male who presents for for a follow-up visit regarding chronic atrial fibrillation, hypertrophic obstructive cardiomyopathy and aortic stenosis.   He is active. He volunteers regularly at [facility].  He has been doing very well and denies any chest pain, palpitations, dizziness or syncope. He reports stable exertional dyspnea. Echocardiogram in April 2017 showed an ejection fraction of 65-70%, significant LVOT obstruction with a peak gradient of 80-85 mmHg and moderate aortic stenosis. Left atrium was severely dilated with moderate pulmonary hypertension. There was mild mitral stenosis.  Past Medical History:  Diagnosis Date  . Atherosclerosis of aorta ([place])    by CT scan in past  . Atrial fibrillation ([place])    and aflutter. pt has a left atrial circuit that is not ablated. was on amiodarone-stopped, now use rate control.   . Bladder cancer ([place])    hx; treated with BCG in past   . Carotid artery disease ([place])    There was calcified plaque but no stenosis by carotid artery screening done at [facility] October, 2013  . Diabetes mellitus    type not specified  . Diabetic neuropathy (HCC)    feet  . Dysrhythmia    A-Fib - Cardio visit 12/13/14 - chart review  . Ejection fraction   . Elevated liver enzymes    over time; hx  . Excessive sweating   . Glaucoma   . Gout   . Head injury    when slipped on ice 2004-2005. stablaized and back on Coumadin   . Headache    migraines - distant past  . HOCM (hypertrophic obstructive cardiomyopathy) (HCC)    mild SAM of the mitral valve, but no LVOT obstrcution.  echo 11/09 EF 55%. [facility] -cho-11/11. mild regurgitation - echo - ``/09  . Homocystinemia ([place])    elevated, mild   . Hypercholesterolemia    treated.   . Hypertension   . Hypotension    orthostatic. dehydration. hospitalized 11/11  . Kidney mass    laproscopic surgery woth cryoablation of a mass outside kidney followed at [facility].   . Motion sickness    ocean boats  . Nausea   . Obstructive sleep apnea    CPAP started successfully 2014  . Sleep apnea    Significant obstructive sleep apnea diagnosed in August, 2012, the patient is to receive CPAP   . Stroke [facility])    "2 old strokes" CT and MRI. [place] [facility] 11/11. diagnosis was dehydration, no acutal reports.   . TSH elevation    on synthroid historically   . Unsteady gait    August, 2012    Past Surgical History:  Procedure Laterality Date  . BLADDER SURGERY    . CATARACT EXTRACTION W/PHACO Left 05/11/2015   Procedure: CATARACT EXTRACTION PHACO AND INTRAOCULAR LENS PLACEMENT (IOC);  Surgeon: Leandrew Koyanagi, MD;  Location: [place];  Service: Ophthalmology;  Laterality: Left;  DIABETIC - oral meds, CPAP  . KIDNEY SURGERY     "froze mass"  . TONSILLECTOMY       Current Outpatient  Prescriptions  Medication Sig Dispense Refill  . BETA CAROTENE PO Take 1 capsule by mouth daily.    [name] BIMATOPROST OP Apply 1 drop to eye every evening.    . Calcium-Vitamin D 600-200 MG-UNIT per tablet Take 1 tablet by mouth daily.      [name] co-enzyme Q-10 30 MG capsule Take 30 mg by mouth daily.      . digoxin (LANOXIN) 0.125 MG tablet TAKE ONE TABLET BY MOUTH ONCE DAILY 90 tablet 3  . fish oil-omega-3 fatty acids 1000 MG capsule Take 2 g by mouth 2 (two) times daily.      . Gelatin Capsules, Empty, CAPS 4 capsules by Does not apply route 2 (two) times daily.      . Glucosamine 500 MG CAPS Take 1 capsule by mouth daily.      [name] glucose blood test strip     . latanoprost (XALATAN) 0.005 % ophthalmic solution Place 1 drop  into both eyes at bedtime.     [name] levothyroxine (SYNTHROID, LEVOTHROID) 50 MCG tablet TAKE ONE TABLET BY MOUTH ONCE DAILY 90 tablet 3  . lidocaine (XYLOCAINE) 2 % solution Use as directed 20 mLs in the mouth or throat as needed for mouth pain. 100 mL 0  . metFORMIN (GLUCOPHAGE) 500 MG tablet TAKE ONE TABLET BY MOUTH ONCE DAILY 90 tablet 3  . Multiple Vitamin (MULTIVITAMIN) tablet Take 1 tablet by mouth daily.      . ONE TOUCH ULTRA TEST test strip USE ONE STRIP TO CHECK GLUCOSE ONCE DAILY 100 each 12  . ONETOUCH DELICA LANCETS 99991111 MISC USE TO TEST BLOOD SUGAR ONCE DAILY 100 each 12  . oseltamivir (TAMIFLU) 75 MG capsule Take 1 capsule (75 mg total) by mouth 2 (two) times daily. 10 capsule 0  . POTASSIUM PO Take 1 tablet by mouth daily.      . simvastatin (ZOCOR) 40 MG tablet Take 0.5 tablets (20 mg total) by mouth at bedtime. 45 tablet 3  . verapamil (CALAN-SR) 240 MG CR tablet TAKE ONE TABLET BY MOUTH ONCE DAILY 180 tablet 1  . vitamin B-12 (CYANOCOBALAMIN) 500 MCG tablet Take 500 mcg by mouth daily.      . vitamin C (ASCORBIC ACID) 500 MG tablet Take 500 mg by mouth daily.      . vitamin E 600 UNIT capsule Take 600 Units by mouth daily.      [name] warfarin (COUMADIN) 5 MG tablet TAKE ONE TABLET BY MOUTH ONCE DAILY EXCEPT  1/2  TABLET  BY  MOUTH  ON  SUNDAY  AND  WEDNESDAY  AS  DIRECTED. 90 tablet 0   No current facility-administered medications for this visit.     Allergies:   Macrolides and ketolides; Mycinettes; Nitrofuran derivatives; Nitrofurantoin; and Erythromycin    Social History:  The patient  reports that he quit smoking about 41 years ago. His smoking use included Cigars. He has never used smokeless tobacco. He reports that he drinks alcohol. He reports that he does not use drugs.   Family History:  The patient's family history includes Arrhythmia in his brother and father.    ROS:  Please see the history of present illness.   Otherwise, review of systems are positive for none.    All other systems are reviewed and negative.    PHYSICAL EXAM: VS:  BP (!) 120/58 (BP Location: Left Arm, Patient Position: Sitting, Cuff Size: Normal)   Pulse 76   Ht 5\' 9"  (1.753 m)   Wt  193 lb 8 oz (87.8 kg)   BMI 28.57 kg/m  , BMI Body mass index is 28.57 kg/m. GEN: Well nourished, well developed, in no acute distress  HEENT: normal  Neck: no JVD, carotid bruits, or masses Cardiac: Irregularly irregular; no rubs, or gallops,no edema . There is 3/6 crescendo decrescendo murmur in the aortic area which is mid peaking with mildly diminished S2. Respiratory:  clear to auscultation bilaterally, normal work of breathing GI: soft, nontender, nondistended, + BS MS: no deformity or atrophy  Skin: warm and dry, no rash Neuro:  Strength and sensation are intact Psych: euthymic mood, full affect   EKG:  EKG is ordered today. The ekg ordered today demonstrates atrial fibrillation, left ventricular hypertrophy with repolarization abnormalities.   Recent Labs: 07/09/2016: BUN 18; Creatinine, Ser 1.04; Hemoglobin 14.0; Platelets 182; Potassium 4.3; Sodium 138; TSH 2.764    Lipid Panel No results found for: CHOL, TRIG, HDL, CHOLHDL, VLDL, LDLCALC, LDLDIRECT    Wt Readings from Last 3 Encounters:  07/09/16 193 lb 8 oz (87.8 kg)  01/21/16 192 lb 3.2 oz (87.2 kg)  01/02/16 195 lb (88.5 kg)        ASSESSMENT AND PLAN:  1.  Chronic atrial fibrillation: He is doing well overall with no significant symptoms related to this. Continue rate control with verapamil and small dose digoxin. He is on long-term anticoagulation with warfarin for many years. I requested routine labs today including digoxin level.  2. Aortic stenosis:  Moderate on most recent echocardiogram. Repeat echo in 2 year.   3. Hypertrophic obstructive cardiomyopathy: Significant LVOT gradient was noted on most recent echocardiogram. I might consider increasing the dose of verapamil in the future to 180 mg twice  daily   4. Hyperlipidemia currently on simvastatin 20 mg daily. Recommend a target LDL of less than 70 given that he is diabetic.    Disposition:   FU with me in 6 months  Signed,  Kathlyn Sacramento, MD  07/09/2016 4:38 PM    [employer]

## 2016-07-09 NOTE — Patient Instructions (Signed)
Medication Instructions:  Your physician recommends that you continue on your current medications as directed. Please refer to the Current Medication list given to you today.   Labwork: BMET, CBC, digoxin, and TSH  Testing/Procedures: none  Follow-Up: Your physician wants you to follow-up in: six months with Dr. Fletcher Anon.  You will receive a reminder letter in the mail two months in advance. If you don't receive a letter, please call our office to schedule the follow-up appointment.   Any Other Special Instructions Will Be Listed Below (If Applicable).     If you need a refill on your cardiac medications before your next appointment, please call your pharmacy.

## 2016-07-12 ENCOUNTER — Telehealth: Payer: Self-pay | Admitting: Cardiovascular Disease

## 2016-07-12 ENCOUNTER — Other Ambulatory Visit: Payer: Self-pay

## 2016-07-12 DIAGNOSIS — I482 Chronic atrial fibrillation, unspecified: Secondary | ICD-10-CM

## 2016-07-12 DIAGNOSIS — I4891 Unspecified atrial fibrillation: Secondary | ICD-10-CM

## 2016-07-12 NOTE — Telephone Encounter (Signed)
Pharmacist calling to change Rx of digoxin (LANOXIN) 0.125 MG tablet. They are changing manufactures and need approval  [facility] on [address] [phone] f [phone]

## 2016-07-12 NOTE — Telephone Encounter (Signed)
Please see note below. 

## 2016-07-12 NOTE — Telephone Encounter (Signed)
[facility] Jaegers pharmacist,  states there is a Best boy for digoxin and needs MD approval.  Reviewed w/Ryan Idolina Primer, PA-C who is agreeable. Pt will need labs 2 days after starting taking it. Informed Colletta [place] of Ryan's approval.   Left message on pt home VM.  Lab order placed.

## 2016-07-13 NOTE — Telephone Encounter (Signed)
Spoke with patient and let him know that we would need to check his digoxin level 2 days after starting the new prescription due to it being a new manufacturer. He verbalized understanding and stated that he had enough of the old to last until Sunday. Scheduled him to come in on 07/18/16 at 09:50AM. He verbalized understanding of our conversation and had no further questions at this time.

## 2016-07-16 ENCOUNTER — Other Ambulatory Visit: Payer: PPO

## 2016-07-18 ENCOUNTER — Other Ambulatory Visit (INDEPENDENT_AMBULATORY_CARE_PROVIDER_SITE_OTHER): Payer: PPO | Admitting: *Deleted

## 2016-07-18 ENCOUNTER — Other Ambulatory Visit: Payer: PPO

## 2016-07-18 DIAGNOSIS — I482 Chronic atrial fibrillation, unspecified: Secondary | ICD-10-CM

## 2016-07-19 ENCOUNTER — Encounter: Payer: Self-pay | Admitting: Family Medicine

## 2016-07-19 LAB — DIGOXIN LEVEL: DIGOXIN, SERUM: 0.5 ng/mL (ref 0.5–0.9)

## 2016-07-25 ENCOUNTER — Ambulatory Visit (INDEPENDENT_AMBULATORY_CARE_PROVIDER_SITE_OTHER): Payer: PPO

## 2016-07-25 DIAGNOSIS — I4892 Unspecified atrial flutter: Secondary | ICD-10-CM

## 2016-07-25 DIAGNOSIS — I482 Chronic atrial fibrillation, unspecified: Secondary | ICD-10-CM

## 2016-07-25 DIAGNOSIS — I4891 Unspecified atrial fibrillation: Secondary | ICD-10-CM | POA: Diagnosis not present

## 2016-07-25 DIAGNOSIS — Z5181 Encounter for therapeutic drug level monitoring: Secondary | ICD-10-CM | POA: Diagnosis not present

## 2016-07-25 LAB — POCT INR: INR: 2.1

## 2016-08-06 ENCOUNTER — Other Ambulatory Visit: Payer: Self-pay | Admitting: Cardiology

## 2016-08-07 DIAGNOSIS — Z08 Encounter for follow-up examination after completed treatment for malignant neoplasm: Secondary | ICD-10-CM | POA: Diagnosis not present

## 2016-08-07 DIAGNOSIS — Z8551 Personal history of malignant neoplasm of bladder: Secondary | ICD-10-CM | POA: Diagnosis not present

## 2016-08-14 ENCOUNTER — Telehealth: Payer: Self-pay | Admitting: Family Medicine

## 2016-08-14 NOTE — Telephone Encounter (Signed)
Called Pt to schedule AWV with [facility] for both husband and wife [id number] & [id number] Nov 22 in P.M.  - knb

## 2016-08-29 ENCOUNTER — Ambulatory Visit (INDEPENDENT_AMBULATORY_CARE_PROVIDER_SITE_OTHER): Payer: PPO

## 2016-08-29 DIAGNOSIS — I4891 Unspecified atrial fibrillation: Secondary | ICD-10-CM

## 2016-08-29 DIAGNOSIS — I4892 Unspecified atrial flutter: Secondary | ICD-10-CM | POA: Diagnosis not present

## 2016-08-29 DIAGNOSIS — Z5181 Encounter for therapeutic drug level monitoring: Secondary | ICD-10-CM

## 2016-08-29 DIAGNOSIS — I482 Chronic atrial fibrillation, unspecified: Secondary | ICD-10-CM

## 2016-08-29 LAB — POCT INR: INR: 2.5

## 2016-08-30 DIAGNOSIS — E119 Type 2 diabetes mellitus without complications: Secondary | ICD-10-CM | POA: Diagnosis not present

## 2016-08-30 DIAGNOSIS — B351 Tinea unguium: Secondary | ICD-10-CM | POA: Diagnosis not present

## 2016-08-31 ENCOUNTER — Other Ambulatory Visit: Payer: Self-pay | Admitting: Cardiology

## 2016-09-14 ENCOUNTER — Other Ambulatory Visit: Payer: Self-pay

## 2016-09-14 MED ORDER — VERAPAMIL HCL ER 240 MG PO TBCR: 240.0000 mg | EXTENDED_RELEASE_TABLET | Freq: Every day | ORAL | 3 refills | Status: DC

## 2016-10-16 ENCOUNTER — Other Ambulatory Visit: Payer: Self-pay | Admitting: Cardiovascular Disease

## 2016-10-16 NOTE — Telephone Encounter (Signed)
Review for refill. 

## 2016-10-17 ENCOUNTER — Ambulatory Visit (INDEPENDENT_AMBULATORY_CARE_PROVIDER_SITE_OTHER): Payer: PPO

## 2016-10-17 DIAGNOSIS — I4892 Unspecified atrial flutter: Secondary | ICD-10-CM

## 2016-10-17 DIAGNOSIS — I4891 Unspecified atrial fibrillation: Secondary | ICD-10-CM

## 2016-10-17 DIAGNOSIS — Z5181 Encounter for therapeutic drug level monitoring: Secondary | ICD-10-CM

## 2016-10-17 DIAGNOSIS — I482 Chronic atrial fibrillation, unspecified: Secondary | ICD-10-CM

## 2016-10-17 LAB — POCT INR: INR: 2.3

## 2016-10-30 ENCOUNTER — Encounter: Payer: Self-pay | Admitting: Family Medicine

## 2016-10-30 ENCOUNTER — Ambulatory Visit: Payer: PPO | Admitting: Family Medicine

## 2016-10-30 ENCOUNTER — Ambulatory Visit (INDEPENDENT_AMBULATORY_CARE_PROVIDER_SITE_OTHER): Payer: PPO

## 2016-10-30 VITALS — BP 129/60 | HR 84 | Temp 97.9°F | Ht 69.0 in | Wt 187.8 lb

## 2016-10-30 VITALS — BP 132/68 | HR 84 | Temp 97.9°F | Resp 16 | Ht 69.0 in | Wt 187.0 lb

## 2016-10-30 DIAGNOSIS — H9193 Unspecified hearing loss, bilateral: Secondary | ICD-10-CM | POA: Diagnosis not present

## 2016-10-30 DIAGNOSIS — I10 Essential (primary) hypertension: Secondary | ICD-10-CM | POA: Diagnosis not present

## 2016-10-30 DIAGNOSIS — H9313 Tinnitus, bilateral: Secondary | ICD-10-CM | POA: Diagnosis not present

## 2016-10-30 DIAGNOSIS — E119 Type 2 diabetes mellitus without complications: Secondary | ICD-10-CM | POA: Diagnosis not present

## 2016-10-30 DIAGNOSIS — Z0000 Encounter for general adult medical examination without abnormal findings: Secondary | ICD-10-CM

## 2016-10-30 DIAGNOSIS — R5383 Other fatigue: Secondary | ICD-10-CM | POA: Diagnosis not present

## 2016-10-30 DIAGNOSIS — C649 Malignant neoplasm of unspecified kidney, except renal pelvis: Secondary | ICD-10-CM

## 2016-10-30 DIAGNOSIS — H9319 Tinnitus, unspecified ear: Secondary | ICD-10-CM | POA: Insufficient documentation

## 2016-10-30 DIAGNOSIS — E7800 Pure hypercholesterolemia, unspecified: Secondary | ICD-10-CM

## 2016-10-30 DIAGNOSIS — I482 Chronic atrial fibrillation, unspecified: Secondary | ICD-10-CM

## 2016-10-30 LAB — POCT URINALYSIS DIPSTICK
Bilirubin, UA: NEGATIVE
Blood, UA: NEGATIVE
GLUCOSE UA: NEGATIVE
KETONES UA: NEGATIVE
Leukocytes, UA: NEGATIVE
Nitrite, UA: NEGATIVE
Protein, UA: NEGATIVE
SPEC GRAV UA: 1.02
Urobilinogen, UA: 0.2
pH, UA: 6

## 2016-10-30 LAB — POCT UA - MICROALBUMIN: Microalbumin Ur, POC: 50 mg/L

## 2016-10-30 NOTE — Progress Notes (Signed)
Subjective:   Rajvir M Enamorado is a 83 y.o. male who presents for an Initial Medicare Annual Wellness Visit.  Review of Systems  N/A  Cardiac Risk Factors include: advanced age (>6men, >56 women);diabetes mellitus;dyslipidemia;male gender;smoking/ tobacco exposure    Objective:    Today's Vitals   10/30/16 0939  BP: 129/60  Pulse: 84  Temp: 97.9 F (36.6 C)  TempSrc: Oral  Weight: 187 lb 12.8 oz (85.2 kg)  Height: 5\' 9"  (1.753 m)  PainSc: 0-No pain   Body mass index is 27.73 kg/m.  Current Medications (verified) Outpatient Encounter Prescriptions as of 10/30/2016  Medication Sig  . Calcium-Vitamin D 600-200 MG-UNIT per tablet Take 1 tablet by mouth daily.    [name] co-enzyme Q-10 30 MG capsule Take 30 mg by mouth daily.    . digoxin (LANOXIN) 0.125 MG tablet TAKE ONE TABLET BY MOUTH ONCE DAILY  . fish oil-omega-3 fatty acids 1000 MG capsule Take 2 g by mouth 2 (two) times daily.    . Gelatin Capsules, Empty, CAPS 2 capsules by Does not apply route 2 (two) times daily.   . Glucosamine 500 MG CAPS Take 1 capsule by mouth daily.    [name] glucose blood test strip   . latanoprost (XALATAN) 0.005 % ophthalmic solution Place 1 drop into both eyes at bedtime.   [name] levothyroxine (SYNTHROID, LEVOTHROID) 50 MCG tablet TAKE ONE TABLET BY MOUTH ONCE DAILY  . metFORMIN (GLUCOPHAGE) 500 MG tablet TAKE ONE TABLET BY MOUTH ONCE DAILY  . ONE TOUCH ULTRA TEST test strip USE ONE STRIP TO CHECK GLUCOSE ONCE DAILY  . ONETOUCH DELICA LANCETS 99991111 MISC USE TO TEST BLOOD SUGAR ONCE DAILY  . POTASSIUM PO Take 1 tablet by mouth daily.    . simvastatin (ZOCOR) 40 MG tablet TAKE ONE-HALF TABLET BY MOUTH AT BEDTIME  . verapamil (CALAN-SR) 240 MG CR tablet Take 1 tablet (240 mg total) by mouth daily.  . vitamin B-12 (CYANOCOBALAMIN) 500 MCG tablet Take 500 mcg by mouth daily.    . vitamin E 600 UNIT capsule Take 600 Units by mouth daily.    [name] warfarin (COUMADIN) 5 MG tablet Take as directed by Coumadin  Clinic (Patient taking differently: Take as directed by Coumadin Clinic)  . BETA CAROTENE PO Take 1 capsule by mouth daily.  [name] BIMATOPROST OP Apply 1 drop to eye every evening.  . lidocaine (XYLOCAINE) 2 % solution Use as directed 20 mLs in the mouth or throat as needed for mouth pain. (Patient not taking: Reported on 10/30/2016)  . Multiple Vitamin (MULTIVITAMIN) tablet Take 1 tablet by mouth daily.    [name] oseltamivir (TAMIFLU) 75 MG capsule Take 1 capsule (75 mg total) by mouth 2 (two) times daily. (Patient not taking: Reported on 10/30/2016)  . vitamin C (ASCORBIC ACID) 500 MG tablet Take 500 mg by mouth daily.     No facility-administered encounter medications on file as of 10/30/2016.     Allergies (verified) Macrolides and ketolides; Mycinettes; Nitrofuran derivatives; Nitrofurantoin; and Erythromycin   History: Past Medical History:  Diagnosis Date  . Atherosclerosis of aorta ([place])    by CT scan in past  . Atrial fibrillation ([place])    and aflutter. pt has a left atrial circuit that is not ablated. was on amiodarone-stopped, now use rate control.   . Bladder cancer ([place])    hx; treated with BCG in past   . Carotid artery disease ([place])    There was calcified plaque but no stenosis by  carotid artery screening done at [facility] October, 2013  . Diabetes mellitus    type not specified  . Diabetic neuropathy (HCC)    feet  . Dysrhythmia    A-Fib - Cardio visit 12/13/14 - chart review  . Ejection fraction   . Elevated liver enzymes    over time; hx  . Excessive sweating   . Glaucoma   . Gout   . Head injury    when slipped on ice 2004-2005. stablaized and back on Coumadin   . Headache    migraines - distant past  . HOCM (hypertrophic obstructive cardiomyopathy) (HCC)    mild SAM of the mitral valve, but no LVOT obstrcution. echo 11/09 EF 55%. [facility] -cho-11/11. mild regurgitation - echo - ``/09  . Homocystinemia ([place])    elevated, mild   .  Hypercholesterolemia    treated.   . Hypertension   . Hypotension    orthostatic. dehydration. hospitalized 11/11  . Kidney mass    laproscopic surgery woth cryoablation of a mass outside kidney followed at [facility].   . Motion sickness    ocean boats  . Nausea   . Obstructive sleep apnea    CPAP started successfully 2014  . Sleep apnea    Significant obstructive sleep apnea diagnosed in August, 2012, the patient is to receive CPAP   . Stroke [facility])    "2 old strokes" CT and MRI. [place] [facility] 11/11. diagnosis was dehydration, no acutal reports.   . TSH elevation    on synthroid historically   . Unsteady gait    August, 2012   Past Surgical History:  Procedure Laterality Date  . BLADDER SURGERY    . CATARACT EXTRACTION W/PHACO Left 05/11/2015   Procedure: CATARACT EXTRACTION PHACO AND INTRAOCULAR LENS PLACEMENT (IOC);  Surgeon: Leandrew Koyanagi, MD;  Location: [place];  Service: Ophthalmology;  Laterality: Left;  DIABETIC - oral meds, CPAP  . KIDNEY SURGERY     "froze mass"  . TONSILLECTOMY     Family History  Problem Relation Age of Onset  . Arrhythmia Father     A-Fib  . Arrhythmia Brother     A-Fib  . Heart attack Neg Hx   . Hypertension Neg Hx   . Stroke Neg Hx    Social History   Occupational History  . Not on file.   Social History Main Topics  . Smoking status: Former Smoker    Types: Cigars, Cigarettes    Quit date: 10/02/1974  . Smokeless tobacco: Never Used     Comment: still smokes cigars  . Alcohol use Yes     Comment: rare - 1 glass wine  . Drug use: No  . Sexual activity: Not on file   Tobacco Counseling Counseling given: Not Answered   Activities of Daily Living In your present state of health, do you have any difficulty performing the following activities: 10/30/2016  Hearing? Y  Vision? N  Difficulty concentrating or making decisions? N  Walking or climbing stairs? N  Dressing or bathing? N  Doing errands, shopping? N    Preparing Food and eating ? N  Using the Toilet? N  In the past six months, have you accidently leaked urine? Y  Do you have problems with loss of bowel control? N  Managing your Medications? N  Managing your Finances? N  Housekeeping or managing your Housekeeping? N  Some recent data might be hidden    Immunizations and Health Maintenance Immunization History  Administered Date(s) Administered  . Influenza-Unspecified 08/15/2013  . Pneumococcal Conjugate-13 04/08/2014  . Pneumococcal Polysaccharide-23 08/07/2003  . Td 11/23/2009  . Zoster 12/27/2008   Health Maintenance Due  Topic Date Due  . FOOT EXAM  11/28/1943  . URINE MICROALBUMIN  11/28/1943  . HEMOGLOBIN A1C  12/13/2015  . INFLUENZA VACCINE  05/15/2016    Patient Care Team: [name], MD as PCP - General (Unknown Physician Specialty) Penni Bombard, MD (Neurology) Margaretha Sheffield, MD (Otolaryngology) Leandrew Koyanagi, MD as Referring Physician (Ophthalmology)  Indicate any recent Medical Services you may have received from other than [facility] providers in the past year (date may be approximate).    Assessment:   This is a routine wellness examination for [place].   Hearing/Vision screen Vision Screening Comments: Pt sees Dr Wallace Going every 6 months for vision checks.   Dietary issues and exercise activities discussed: Current Exercise Habits: Home exercise routine, Type of exercise: walking, Time (Minutes): 15 (to 20 mins), Frequency (Times/Week): 7, Weekly Exercise (Minutes/Week): 105, Intensity: Mild  Goals    . Increase water intake          Starting 10/30/16, I will increase my water intake to 4 glasses a day.      Depression Screen PHQ 2/9 Scores 10/30/2016 06/13/2015  PHQ - 2 Score 0 0    Fall Risk Fall Risk  10/30/2016 06/13/2015  Falls in the past year? No Yes  Number falls in past yr: - 2 or more  Injury with Fall? - No  Risk Factor Category  - High Fall Risk    Cognitive  Function:        Screening Tests Health Maintenance  Topic Date Due  . FOOT EXAM  11/28/1943  . URINE MICROALBUMIN  [birth year]28/1943  . HEMOGLOBIN A1C  12/13/2015  . INFLUENZA VACCINE  05/15/2016  . OPHTHALMOLOGY EXAM  07/10/2017  . TETANUS/TDAP  11/24/2019  . ZOSTAVAX  Completed  . PNA vac Low Risk Adult  Completed        Plan:  I have personally reviewed and addressed the Medicare Annual Wellness questionnaire and have noted the following in the patient's chart:  A. Medical and social history B. Use of alcohol, tobacco or illicit drugs  C. Current medications and supplements D. Functional ability and status E.  Nutritional status F.  Physical activity G. Advance directives H. List of other physicians I.  Hospitalizations, surgeries, and ER visits in previous 12 months J.  [place] such as hearing and vision if needed, cognitive and depression L. Referrals and appointments - none  In addition, I have reviewed and discussed with patient certain preventive protocols, quality metrics, and best practice recommendations. A written personalized care plan for preventive services as well as general preventive health recommendations were provided to patient.  See attached scanned questionnaire for additional information.   Signed,  Fabio Neighbors, LPN Nurse Health Advisor   MD Recommendations: Follow up on Hgb A1c and urine microalbumin.  I have reviewed the health advisors note, was  available for consultation and I agree with documentation and plan. Miguel Aschoff MD [facility]

## 2016-10-30 NOTE — Patient Instructions (Addendum)

## 2016-10-30 NOTE — Progress Notes (Signed)
Patient: [name] [name], Male    DOB: [birth year], 83 y.o.   MRN: [id number] Visit Date: 10/30/2016  Today's Provider: [name], MD   Chief Complaint  Patient presents with  . Annual Exam   Subjective:    Annual physical exam [name] [name] is a 83 y.o. male who presents today for health maintenance and complete physical. He feels well. He reports exercising daily. Walks around his yard, and does yard work. He reports he is sleeping fairly well. He admits to feeling older recently. He is slowing down some. Nothing specific. No depression. Lats colonoscopy- 05/28/2002- Normal.   -----------------------------------------------------------------   Review of Systems  Constitutional: Positive for fatigue. Negative for activity change, appetite change, chills, diaphoresis, fever and unexpected weight change.  HENT: Positive for rhinorrhea, sneezing and tinnitus. Negative for congestion, dental problem, drooling, ear discharge, ear pain, facial swelling, hearing loss, mouth sores, nosebleeds, postnasal drip, sinus pain, sinus pressure, sore throat, trouble swallowing and voice change.   Eyes: Positive for photophobia. Negative for pain, discharge, redness, itching and visual disturbance.  Respiratory: Positive for cough. Negative for apnea, choking, chest tightness, shortness of breath, wheezing and stridor.   Cardiovascular: Negative.   Gastrointestinal: Negative.   Endocrine: Negative.   Genitourinary: Negative.   Musculoskeletal: Positive for arthralgias. Negative for back pain, gait problem, joint swelling, myalgias, neck pain and neck stiffness.  Skin: Negative.   Allergic/Immunologic: Negative.   Neurological: Negative.   Hematological: Negative.   Psychiatric/Behavioral: Negative.     Social History      He  reports that he quit smoking about 42 years ago. His smoking use included Cigars and Cigarettes. He has never used smokeless tobacco. He reports  that he drinks alcohol. He reports that he does not use drugs.       Social History   Social History  . Marital status: Married    Spouse name: N/A  . Number of children: N/A  . Years of education: N/A   Social History Main Topics  . Smoking status: Former Smoker    Types: Cigars, Cigarettes    Quit date: 10/02/1974  . Smokeless tobacco: Never Used     Comment: still smokes cigars  . Alcohol use Yes     Comment: rare - 1 glass wine  . Drug use: No  . Sexual activity: Not Asked   Other Topics Concern  . None   Social History Narrative   Married, retired, gets regular exercise.     Past Medical History:  Diagnosis Date  . Atherosclerosis of aorta ([place])    by CT scan in past  . Atrial fibrillation ([place])    and aflutter. pt has a left atrial circuit that is not ablated. was on amiodarone-stopped, now use rate control.   . Bladder cancer ([place])    hx; treated with BCG in past   . Carotid artery disease ([place])    There was calcified plaque but no stenosis by carotid artery screening done at [facility] October, 2013  . Diabetes mellitus    type not specified  . Diabetic neuropathy (HCC)    feet  . Dysrhythmia    A-Fib - Cardio visit 12/13/14 - chart review  . Ejection fraction   . Elevated liver enzymes    over time; hx  . Excessive sweating   . Glaucoma   . Gout   . Head injury    when slipped on ice 2004-2005. stablaized and  back on Coumadin   . Headache    migraines - distant past  . HOCM (hypertrophic obstructive cardiomyopathy) (HCC)    mild SAM of the mitral valve, but no LVOT obstrcution. echo 11/09 EF 55%. [place] [facility] -cho-11/11. mild regurgitation - echo - ``/09  . Homocystinemia ([place])    elevated, mild   . Hypercholesterolemia    treated.   . Hypertension   . Hypotension    orthostatic. dehydration. hospitalized 11/11  . Kidney mass    laproscopic surgery woth cryoablation of a mass outside kidney followed at [facility].   . Motion  sickness    ocean boats  . Nausea   . Obstructive sleep apnea    CPAP started successfully 2014  . Sleep apnea    Significant obstructive sleep apnea diagnosed in August, 2012, the patient is to receive CPAP   . Stroke [facility])    "2 old strokes" CT and MRI. [place] [facility] 11/11. diagnosis was dehydration, no acutal reports.   . TSH elevation    on synthroid historically   . Unsteady gait    August, 2012     Patient Active Problem List   Diagnosis Date Noted  . On warfarin therapy 07/06/2015  . Arteriosclerosis of coronary artery 06/13/2015  . Carcinoma in situ of bladder 06/13/2015  . Diabetes mellitus, type 2 ([place]) 06/13/2015  . Diabetic neuropathy ([place]) 06/13/2015  . Dizziness 06/13/2015  . Polypharmacy 06/13/2015  . Essential (primary) hypertension 06/13/2015  . Fatigue 06/13/2015  . Glaucoma 06/13/2015  . Hypercholesteremia 06/13/2015  . Male hypogonadism 06/13/2015  . Adult hypothyroidism 06/13/2015  . Gonalgia 06/13/2015  . Does not feel right 06/13/2015  . Arthritis, degenerative 06/13/2015  . PNA (pneumonia) 06/13/2015  . Encounter for screening for malignant neoplasm of prostate 06/13/2015  . Adenocarcinoma, renal cell ([place]) 06/13/2015  . Benign head tremor 12/13/2014  . Aortic atherosclerosis ([place]) 07/12/2014  . Abnormal liver enzymes 07/12/2014  . Carotid arterial disease ([place]) 07/12/2014  . Cerebral vascular accident ([place]) 07/12/2014  . Decreased cardiac output 07/12/2014  . Cardiomyopathy, hypertrophic obstructive ([place]) 07/12/2014  . Elevated TSH 07/12/2014  . Head injuries 07/12/2014  . Obstructive apnea 07/12/2014  . Kidney lump 07/12/2014  . Ejection fraction   . HOCM (hypertrophic obstructive cardiomyopathy) ([place])   . Encounter for therapeutic drug monitoring 11/16/2013  . Obstructive sleep apnea   . Carotid artery disease ([place])   . Benign prostatic hypertrophy without urinary obstruction 06/09/2012  . H/O urinary disorder 06/09/2012  . History  of neoplasm of bladder 06/09/2012  . Head injury   . Elevated liver enzymes   . TSH elevation   . Kidney mass   . Atherosclerosis of aorta ([place])   . Hypotension   . Stroke ([place])   . Unsteady gait   . Sleep apnea   . Excessive sweating   . Nausea   . ORTHOSTATIC HYPOTENSION, HX OF 09/01/2010  . H/O cardiovascular disorder 09/01/2010  . Overweight(278.02) 03/07/2009  . AODM 03/05/2009  . HYPERLIPIDEMIA 03/05/2009  . MITRAL REGURGITATION 03/05/2009  . Chronic atrial fibrillation ([place]) 03/05/2009  . Atrial flutter ([place]) 03/05/2009  . A-fib ([place]) 03/05/2009  . Mitral and aortic incompetence 03/05/2009    Past Surgical History:  Procedure Laterality Date  . BLADDER SURGERY    . CATARACT EXTRACTION W/PHACO Left 05/11/2015   Procedure: CATARACT EXTRACTION PHACO AND INTRAOCULAR LENS PLACEMENT (IOC);  Surgeon: Leandrew Koyanagi, MD;  Location: [facility];  Service: Ophthalmology;  Laterality: Left;  DIABETIC -  oral meds, CPAP  . KIDNEY SURGERY     "froze mass"  . TONSILLECTOMY      Family History        Family Status  Relation Status  . Father Deceased  . Mother Deceased  . Brother Deceased  . Neg Hx         His family history includes Arrhythmia in his brother and father.     Allergies  Allergen Reactions  . Macrolides And Ketolides Other (See Comments)  . Mycinettes   . Nitrofuran Derivatives Other (See Comments)  . Nitrofurantoin Other (See Comments)  . Erythromycin Itching and Rash    Other reaction(s): UNKNOWN  And red skin     Current Outpatient Prescriptions:  .  BETA CAROTENE PO, Take 1 capsule by mouth daily., Disp: , Rfl:  .  BIMATOPROST OP, Apply 1 drop to eye every evening., Disp: , Rfl:  .  Calcium-Vitamin D 600-200 MG-UNIT per tablet, Take 1 tablet by mouth daily.  , Disp: , Rfl:  .  co-enzyme Q-10 30 MG capsule, Take 30 mg by mouth daily.  , Disp: , Rfl:  .  digoxin (LANOXIN) 0.125 MG tablet, TAKE ONE TABLET BY MOUTH ONCE DAILY, Disp: 90  tablet, Rfl: 3 .  fish oil-omega-3 fatty acids 1000 MG capsule, Take 2 g by mouth 2 (two) times daily.  , Disp: , Rfl:  .  Gelatin Capsules, Empty, CAPS, 2 capsules by Does not apply route 2 (two) times daily. , Disp: , Rfl:  .  Glucosamine 500 MG CAPS, Take 1 capsule by mouth daily.  , Disp: , Rfl:  .  glucose blood test strip, , Disp: , Rfl:  .  latanoprost (XALATAN) 0.005 % ophthalmic solution, Place 1 drop into both eyes at bedtime. , Disp: , Rfl:  .  levothyroxine (SYNTHROID, LEVOTHROID) 50 MCG tablet, TAKE ONE TABLET BY MOUTH ONCE DAILY, Disp: 90 tablet, Rfl: 3 .  lidocaine (XYLOCAINE) 2 % solution, Use as directed 20 mLs in the mouth or throat as needed for mouth pain., Disp: 100 mL, Rfl: 0 .  metFORMIN (GLUCOPHAGE) 500 MG tablet, TAKE ONE TABLET BY MOUTH ONCE DAILY, Disp: 90 tablet, Rfl: 3 .  Multiple Vitamin (MULTIVITAMIN) tablet, Take 1 tablet by mouth daily.  , Disp: , Rfl:  .  ONE TOUCH ULTRA TEST test strip, USE ONE STRIP TO CHECK GLUCOSE ONCE DAILY, Disp: 100 each, Rfl: 12 .  ONETOUCH DELICA LANCETS 99991111 MISC, USE TO TEST BLOOD SUGAR ONCE DAILY, Disp: 100 each, Rfl: 12 .  oseltamivir (TAMIFLU) 75 MG capsule, Take 1 capsule (75 mg total) by mouth 2 (two) times daily., Disp: 10 capsule, Rfl: 0 .  POTASSIUM PO, Take 1 tablet by mouth daily.  , Disp: , Rfl:  .  simvastatin (ZOCOR) 40 MG tablet, TAKE ONE-HALF TABLET BY MOUTH AT BEDTIME, Disp: 45 tablet, Rfl: 3 .  verapamil (CALAN-SR) 240 MG CR tablet, Take 1 tablet (240 mg total) by mouth daily., Disp: 180 tablet, Rfl: 3 .  vitamin B-12 (CYANOCOBALAMIN) 500 MCG tablet, Take 500 mcg by mouth daily.  , Disp: , Rfl:  .  vitamin C (ASCORBIC ACID) 500 MG tablet, Take 500 mg by mouth daily.  , Disp: , Rfl:  .  vitamin E 600 UNIT capsule, Take 600 Units by mouth daily.  , Disp: , Rfl:  .  warfarin (COUMADIN) 5 MG tablet, Take as directed by [facility] (Patient taking differently: Take as directed by [facility]), Disp: 90  tablet, Rfl: 1     Patient Care Team: [name], MD as PCP - General (Unknown Physician Specialty) Margaretha Sheffield, MD (Otolaryngology) Leandrew Koyanagi, MD as Referring Physician (Ophthalmology) Wellington Hampshire, MD as Consulting Physician (Cardiology) Sharlotte Alamo, DPM as Consulting Physician (Podiatry) Abbie Sons, MD as Consulting Physician (Urology) Carmon Ginsberg, PA as Referring Physician (Family Medicine)      Objective:   Vitals: BP 132/68 (BP Location: Left Arm, Patient Position: Sitting, Cuff Size: Large)   Pulse 84   Temp 97.9 F (36.6 C) (Oral)   Resp 16   Ht 5\' 9"  (1.753 m)   Wt 187 lb (84.8 kg)   BMI 27.62 kg/m    Physical Exam  Constitutional: He appears well-developed and well-nourished. No distress.  HENT:  Head: Normocephalic and atraumatic.  Right Ear: Tympanic membrane and external ear normal.  Left Ear: Tympanic membrane and external ear normal.  Nose: Nose normal.  Mouth/Throat: Oropharynx is clear and moist. No oropharyngeal exudate.  Eyes: Conjunctivae and EOM are normal. Pupils are equal, round, and reactive to light. Right eye exhibits no discharge. Left eye exhibits no discharge. No scleral icterus.  Neck: Normal range of motion. Neck supple. No tracheal deviation present. No thyromegaly present.  Cardiovascular: Normal rate, regular rhythm and normal heart sounds.   Pulmonary/Chest: Effort normal and breath sounds normal. No respiratory distress.  Abdominal: Soft. Bowel sounds are normal. He exhibits no distension. There is no tenderness.  Lymphadenopathy:    He has no cervical adenopathy.  Skin: He is not diaphoretic.  Psychiatric: He has a normal mood and affect. His behavior is normal. Judgment and thought content normal.     Depression Screen PHQ 2/9 Scores 10/30/2016 06/13/2015  PHQ - 2 Score 0 0      Assessment & Plan:     Routine Health Maintenance and Physical Exam  Exercise Activities and Dietary recommendations Goals    .  Increase water intake          Starting 10/30/16, I will increase my water intake to 4 glasses a day.       Immunization History  Administered Date(s) Administered  . Influenza-Unspecified 08/15/2013  . Pneumococcal Conjugate-13 04/08/2014  . Pneumococcal Polysaccharide-23 08/07/2003  . Td 11/23/2009  . Zoster 12/27/2008    Health Maintenance  Topic Date Due  . URINE MICROALBUMIN  11/28/1945  . HEMOGLOBIN A1C  12/13/2015  . OPHTHALMOLOGY EXAM  07/10/2017  . FOOT EXAM  08/30/2017  . TETANUS/TDAP  11/24/2019  . INFLUENZA VACCINE  Completed  . ZOSTAVAX  Completed  . PNA vac Low Risk Adult  Completed     Discussed health benefits of physical activity, and encouraged him to engage in regular exercise appropriate for his age and condition.    -------------------------------------------------------------------- 1. Annual physical exam Stable. As above. Results for orders placed or performed in visit on 10/30/16  POCT urinalysis dipstick  Result Value Ref Range   Color, UA Amber    Clarity, UA Clear    Glucose, UA Negative    Bilirubin, UA Negative    Ketones, UA Negative    Spec Grav, UA 1.020    Blood, UA Negative    pH, UA 6.0    Protein, UA Negative    Urobilinogen, UA 0.2    Nitrite, UA Negative    Leukocytes, UA Negative Negative  POCT UA - Microalbumin  Result Value Ref Range   Microalbumin Ur, POC 50 mg/L  2. Other fatigue   3. Chronic atrial fibrillation ([place])   4. Essential (primary) hypertension Stable. - Comprehensive metabolic panel  5. Type 2 diabetes mellitus without complication, without long-term current use of insulin ([place]) FU pending lab results. -- Hemoglobin A1c - Urine Microalbumin  6. Hypercholesteremia FU pending lab results. - CBC with Differential/Platelet - Lipid panel - TSH  7. Renal cell carcinoma, unspecified laterality ([place]) Refer back to uro. - POCT urinalysis dipstick - Ambulatory referral to Urology  8.  Tinnitus of both ears Refer to ENT. - Ambulatory referral to ENT  9. Bilateral hearing loss, unspecified hearing loss type Refer to ENT. - Ambulatory referral to ENT    Patient seen and examined by Miguel Aschoff, MD, and note scribed by Renaldo Fiddler, CMA. I have done the exam and reviewed the above chart and it is accurate to the best of my knowledge. Development worker, community has been used in this note in any air is in the dictation or transcription are unintentional.  [name], MD  [place]

## 2016-12-05 ENCOUNTER — Ambulatory Visit (INDEPENDENT_AMBULATORY_CARE_PROVIDER_SITE_OTHER): Payer: PPO

## 2016-12-05 DIAGNOSIS — E119 Type 2 diabetes mellitus without complications: Secondary | ICD-10-CM | POA: Diagnosis not present

## 2016-12-05 DIAGNOSIS — I482 Chronic atrial fibrillation, unspecified: Secondary | ICD-10-CM

## 2016-12-05 DIAGNOSIS — I4891 Unspecified atrial fibrillation: Secondary | ICD-10-CM

## 2016-12-05 DIAGNOSIS — L851 Acquired keratosis [keratoderma] palmaris et plantaris: Secondary | ICD-10-CM | POA: Diagnosis not present

## 2016-12-05 DIAGNOSIS — Z5181 Encounter for therapeutic drug level monitoring: Secondary | ICD-10-CM

## 2016-12-05 DIAGNOSIS — B351 Tinea unguium: Secondary | ICD-10-CM | POA: Diagnosis not present

## 2016-12-05 DIAGNOSIS — I4892 Unspecified atrial flutter: Secondary | ICD-10-CM | POA: Diagnosis not present

## 2016-12-05 LAB — POCT INR: INR: 2.3

## 2016-12-10 DIAGNOSIS — I10 Essential (primary) hypertension: Secondary | ICD-10-CM | POA: Diagnosis not present

## 2016-12-10 DIAGNOSIS — H6123 Impacted cerumen, bilateral: Secondary | ICD-10-CM | POA: Diagnosis not present

## 2016-12-10 DIAGNOSIS — E119 Type 2 diabetes mellitus without complications: Secondary | ICD-10-CM | POA: Diagnosis not present

## 2016-12-10 DIAGNOSIS — G4733 Obstructive sleep apnea (adult) (pediatric): Secondary | ICD-10-CM | POA: Diagnosis not present

## 2016-12-10 DIAGNOSIS — H903 Sensorineural hearing loss, bilateral: Secondary | ICD-10-CM | POA: Diagnosis not present

## 2016-12-10 DIAGNOSIS — E7800 Pure hypercholesterolemia, unspecified: Secondary | ICD-10-CM | POA: Diagnosis not present

## 2016-12-11 DIAGNOSIS — H401131 Primary open-angle glaucoma, bilateral, mild stage: Secondary | ICD-10-CM | POA: Diagnosis not present

## 2016-12-11 LAB — CBC WITH DIFFERENTIAL/PLATELET
BASOS: 1 %
Basophils Absolute: 0.1 10*3/uL (ref 0.0–0.2)
EOS (ABSOLUTE): 0.3 10*3/uL (ref 0.0–0.4)
EOS: 4 %
HEMATOCRIT: 43.6 % (ref 37.5–51.0)
Hemoglobin: 14.8 g/dL (ref 13.0–17.7)
IMMATURE GRANULOCYTES: 0 %
Immature Grans (Abs): 0 10*3/uL (ref 0.0–0.1)
Lymphocytes Absolute: 3 10*3/uL (ref 0.7–3.1)
Lymphs: 36 %
MCH: 31.6 pg (ref 26.6–33.0)
MCHC: 33.9 g/dL (ref 31.5–35.7)
MCV: 93 fL (ref 79–97)
MONOS ABS: 0.7 10*3/uL (ref 0.1–0.9)
Monocytes: 8 %
NEUTROS PCT: 51 %
Neutrophils Absolute: 4.4 10*3/uL (ref 1.4–7.0)
Platelets: 189 10*3/uL (ref 150–379)
RBC: 4.68 x10E6/uL (ref 4.14–5.80)
RDW: 13.8 % (ref 12.3–15.4)
WBC: 8.5 10*3/uL (ref 3.4–10.8)

## 2016-12-11 LAB — COMPREHENSIVE METABOLIC PANEL
A/G RATIO: 1.5 (ref 1.2–2.2)
ALBUMIN: 4.4 g/dL (ref 3.5–4.7)
ALT: 27 IU/L (ref 0–44)
AST: 38 IU/L (ref 0–40)
Alkaline Phosphatase: 69 IU/L (ref 39–117)
BUN / CREAT RATIO: 16 (ref 10–24)
BUN: 16 mg/dL (ref 8–27)
Bilirubin Total: 0.5 mg/dL (ref 0.0–1.2)
CALCIUM: 9.7 mg/dL (ref 8.6–10.2)
CO2: 25 mmol/L (ref 18–29)
CREATININE: 1 mg/dL (ref 0.76–1.27)
Chloride: 99 mmol/L (ref 96–106)
GFR, EST AFRICAN AMERICAN: 81 mL/min/{1.73_m2} (ref >59)
GFR, EST NON AFRICAN AMERICAN: 70 mL/min/{1.73_m2} (ref >59)
GLOBULIN, TOTAL: 3 g/dL (ref 1.5–4.5)
Glucose: 128 mg/dL — ABNORMAL HIGH (ref 65–99)
POTASSIUM: 4.5 mmol/L (ref 3.5–5.2)
SODIUM: 140 mmol/L (ref 134–144)
TOTAL PROTEIN: 7.4 g/dL (ref 6.0–8.5)

## 2016-12-11 LAB — LIPID PANEL
Chol/HDL Ratio: 4.1 ratio units (ref 0.0–5.0)
Cholesterol, Total: 168 mg/dL (ref 100–199)
HDL: 41 mg/dL (ref >39)
LDL Calculated: 96 mg/dL (ref 0–99)
Triglycerides: 155 mg/dL — ABNORMAL HIGH (ref 0–149)
VLDL Cholesterol Cal: 31 mg/dL (ref 5–40)

## 2016-12-11 LAB — HEMOGLOBIN A1C
ESTIMATED AVERAGE GLUCOSE: 143 mg/dL
HEMOGLOBIN A1C: 6.6 % — AB (ref 4.8–5.6)

## 2016-12-11 LAB — TSH: TSH: 3.68 u[IU]/mL (ref 0.450–4.500)

## 2016-12-17 DIAGNOSIS — H401131 Primary open-angle glaucoma, bilateral, mild stage: Secondary | ICD-10-CM | POA: Diagnosis not present

## 2016-12-27 ENCOUNTER — Other Ambulatory Visit: Payer: Self-pay | Admitting: Family Medicine

## 2016-12-31 DIAGNOSIS — Z85528 Personal history of other malignant neoplasm of kidney: Secondary | ICD-10-CM | POA: Diagnosis not present

## 2016-12-31 DIAGNOSIS — Z8551 Personal history of malignant neoplasm of bladder: Secondary | ICD-10-CM | POA: Diagnosis not present

## 2017-01-02 ENCOUNTER — Other Ambulatory Visit: Payer: Self-pay | Admitting: Cardiovascular Disease

## 2017-01-16 ENCOUNTER — Ambulatory Visit (INDEPENDENT_AMBULATORY_CARE_PROVIDER_SITE_OTHER): Payer: PPO

## 2017-01-16 DIAGNOSIS — Z5181 Encounter for therapeutic drug level monitoring: Secondary | ICD-10-CM

## 2017-01-16 DIAGNOSIS — I4891 Unspecified atrial fibrillation: Secondary | ICD-10-CM | POA: Diagnosis not present

## 2017-01-16 DIAGNOSIS — I482 Chronic atrial fibrillation, unspecified: Secondary | ICD-10-CM

## 2017-01-16 DIAGNOSIS — I4892 Unspecified atrial flutter: Secondary | ICD-10-CM | POA: Diagnosis not present

## 2017-01-16 LAB — POCT INR: INR: 2.3

## 2017-01-18 DIAGNOSIS — G4733 Obstructive sleep apnea (adult) (pediatric): Secondary | ICD-10-CM | POA: Diagnosis not present

## 2017-02-19 DIAGNOSIS — Z86018 Personal history of other benign neoplasm: Secondary | ICD-10-CM | POA: Diagnosis not present

## 2017-02-19 DIAGNOSIS — Z872 Personal history of diseases of the skin and subcutaneous tissue: Secondary | ICD-10-CM | POA: Diagnosis not present

## 2017-02-19 DIAGNOSIS — L821 Other seborrheic keratosis: Secondary | ICD-10-CM | POA: Diagnosis not present

## 2017-02-19 DIAGNOSIS — L570 Actinic keratosis: Secondary | ICD-10-CM | POA: Diagnosis not present

## 2017-02-21 ENCOUNTER — Encounter: Payer: Self-pay | Admitting: Physician Assistant

## 2017-02-21 ENCOUNTER — Ambulatory Visit (INDEPENDENT_AMBULATORY_CARE_PROVIDER_SITE_OTHER): Payer: PPO | Admitting: Physician Assistant

## 2017-02-21 VITALS — BP 118/72 | HR 98 | Temp 99.0°F | Resp 20 | Wt 184.0 lb

## 2017-02-21 DIAGNOSIS — J40 Bronchitis, not specified as acute or chronic: Secondary | ICD-10-CM

## 2017-02-21 MED ORDER — HYDROCODONE-HOMATROPINE 5-1.5 MG/5ML PO SYRP: 5.0000 mL | ORAL_SOLUTION | Freq: Two times a day (BID) | ORAL | 0 refills | Status: DC | PRN

## 2017-02-21 MED ORDER — PREDNISONE 10 MG PO TABS: ORAL_TABLET | ORAL | 0 refills | Status: DC

## 2017-02-21 NOTE — Progress Notes (Signed)
Patient: [name] [name] Male    DOB: 03-16-1934   83 y.o.   MRN: [id number] Visit Date: 02/21/2017  Today's Provider: Mar Daring, PA-C   Chief Complaint  Patient presents with  . URI   Subjective:    URI   This is a new problem. Episode onset: x 3 days. The maximum temperature recorded prior to his arrival was 102 - 102.9 F (last night). Associated symptoms include abdominal pain (tenderness from coughing), coughing (productive of clear and thick sputum), rhinorrhea, sneezing and a sore throat (slight). Pertinent negatives include no chest pain, congestion, ear pain, headaches, nausea, neck pain, plugged ear sensation, sinus pain, swollen glands, vomiting or wheezing. Treatments tried: Pt is on Doxycycline BID x 2 weeks for tick bite.  Pt also took Tylenol, which helped break his fever.     Allergies  Allergen Reactions  . Macrolides And Ketolides Other (See Comments)  . Mycinettes   . Nitrofuran Derivatives Other (See Comments)  . Nitrofurantoin Other (See Comments)  . Erythromycin Itching and Rash    Other reaction(s): UNKNOWN  And red skin     Current Outpatient Prescriptions:  .  BETA CAROTENE PO, Take 1 capsule by mouth daily., Disp: , Rfl:  .  Calcium-Vitamin D 600-200 MG-UNIT per tablet, Take 1 tablet by mouth daily.  , Disp: , Rfl:  .  co-enzyme Q-10 30 MG capsule, Take 30 mg by mouth daily.  , Disp: , Rfl:  .  digoxin (LANOXIN) 0.125 MG tablet, TAKE ONE TABLET BY MOUTH ONCE DAILY, Disp: 90 tablet, Rfl: 3 .  fish oil-omega-3 fatty acids 1000 MG capsule, Take 2 g by mouth 2 (two) times daily.  , Disp: , Rfl:  .  Glucosamine 500 MG CAPS, Take 1 capsule by mouth daily.  , Disp: , Rfl:  .  glucose blood test strip, , Disp: , Rfl:  .  latanoprost (XALATAN) 0.005 % ophthalmic solution, Place 1 drop into both eyes at bedtime. , Disp: , Rfl:  .  levothyroxine (SYNTHROID, LEVOTHROID) 50 MCG tablet, TAKE ONE TABLET BY MOUTH ONCE DAILY, Disp: 90 tablet, Rfl:  3 .  metFORMIN (GLUCOPHAGE) 500 MG tablet, TAKE ONE TABLET BY MOUTH ONCE DAILY, Disp: 90 tablet, Rfl: 3 .  ONE TOUCH ULTRA TEST test strip, USE ONE STRIP TO CHECK GLUCOSE ONCE DAILY, Disp: 100 each, Rfl: 12 .  ONETOUCH DELICA LANCETS 65K MISC, USE TO TEST BLOOD SUGAR ONCE DAILY, Disp: 100 each, Rfl: 12 .  POTASSIUM PO, Take 1 tablet by mouth daily.  , Disp: , Rfl:  .  simvastatin (ZOCOR) 40 MG tablet, TAKE ONE-HALF TABLET BY MOUTH AT BEDTIME, Disp: 45 tablet, Rfl: 3 .  verapamil (CALAN-SR) 240 MG CR tablet, Take 1 tablet (240 mg total) by mouth daily., Disp: 180 tablet, Rfl: 3 .  vitamin B-12 (CYANOCOBALAMIN) 500 MCG tablet, Take 500 mcg by mouth daily.  , Disp: , Rfl:  .  vitamin C (ASCORBIC ACID) 500 MG tablet, Take 500 mg by mouth daily.  , Disp: , Rfl:  .  vitamin E 600 UNIT capsule, Take 600 Units by mouth daily.  , Disp: , Rfl:  .  warfarin (COUMADIN) 5 MG tablet, Take as directed by [facility] (Patient taking differently: Take as directed by [facility]), Disp: 90 tablet, Rfl: 1 .  doxycycline (VIBRA-TABS) 100 MG tablet, Take 1 tablet by mouth 2 (two) times daily. X 2 weeks for tick bite, Disp: , Rfl:  .  Gelatin Capsules, Empty, CAPS, 2 capsules by Does not apply route 2 (two) times daily. , Disp: , Rfl:   Review of Systems  HENT: Positive for postnasal drip, rhinorrhea, sinus pressure, sneezing and sore throat (slight). Negative for congestion, ear pain and sinus pain.   Respiratory: Positive for cough (productive of clear and thick sputum). Negative for chest tightness, shortness of breath and wheezing.   Cardiovascular: Negative for chest pain, palpitations and leg swelling.  Gastrointestinal: Positive for abdominal pain (tenderness from coughing). Negative for nausea and vomiting.  Musculoskeletal: Negative for neck pain.  Neurological: Negative for dizziness and headaches.    Social History  Substance Use Topics  . Smoking status: Former Smoker    Types: Cigars,  Cigarettes    Quit date: 10/02/1974  . Smokeless tobacco: Never Used     Comment: still smokes cigars  . Alcohol use Yes     Comment: rare - 1 beer   Objective:   BP 118/72 (BP Location: Left Arm, Patient Position: Sitting, Cuff Size: Large)   Pulse 98   Temp 99 F (37.2 C) (Oral)   Resp 20   Wt 184 lb (83.5 kg)   SpO2 94%   BMI 27.17 kg/m  Vitals:   02/21/17 1509  BP: 118/72  Pulse: 98  Resp: 20  Temp: 99 F (37.2 C)  TempSrc: Oral  SpO2: 94%  Weight: 184 lb (83.5 kg)     Physical Exam  Constitutional: He appears well-developed and well-nourished. No distress.  HENT:  Head: Normocephalic and atraumatic.  Right Ear: Hearing, tympanic membrane, external ear and ear canal normal. Tympanic membrane is not erythematous and not bulging. No middle ear effusion.  Left Ear: Hearing, tympanic membrane, external ear and ear canal normal. Tympanic membrane is not erythematous and not bulging.  No middle ear effusion.  Nose: Mucosal edema and rhinorrhea present. Right sinus exhibits no maxillary sinus tenderness and no frontal sinus tenderness. Left sinus exhibits no maxillary sinus tenderness and no frontal sinus tenderness.  Mouth/Throat: Uvula is midline, oropharynx is clear and moist and mucous membranes are normal. No oropharyngeal exudate, posterior oropharyngeal edema or posterior oropharyngeal erythema.  Eyes: Conjunctivae and EOM are normal. Pupils are equal, round, and reactive to light. Right eye exhibits no discharge. Left eye exhibits no discharge.  Neck: Normal range of motion. Neck supple. No tracheal deviation present. No Brudzinski's sign and no Kernig's sign noted. No thyromegaly present.  Cardiovascular: Normal rate, regular rhythm and normal heart sounds.  Exam reveals no gallop and no friction rub.   No murmur heard. Pulmonary/Chest: Effort normal. No stridor. No respiratory distress. He has wheezes (scattered wheezes heard throughout. No consolidation). He has no  rales.  Lymphadenopathy:    He has no cervical adenopathy.  Skin: Skin is warm and dry. He is not diaphoretic.  Vitals reviewed.      Assessment & Plan:     1. Bronchitis Worsening symptoms that have not responded to OTC medications. Will have him continue doxycycline, which he is on for tick bite for 12 more days. Prednisone given for wheezing. Worsening symptoms that has not responded to OTC medications. Will give Hycodan cough syrup as below for nighttime cough. Drowsiness precautions given to patient. Stay well hydrated. Use delsym, robitussin OR mucinex for daytime cough. - predniSONE (DELTASONE) 10 MG tablet; Take 6 tabs PO on day 1&2, 5 tabs PO on day 3&4, 4 tabs PO on day 5&6, 3 tabs PO on day 7&8, 2 tabs PO  on day 9&10, 1 tab PO on day 11&12.  Dispense: 42 tablet; Refill: 0 - HYDROcodone-homatropine (HYCODAN) 5-1.5 MG/5ML syrup; Take 5 mLs by mouth every 12 (twelve) hours as needed for cough.  Dispense: 180 mL; Refill: 0       Mar Daring, PA-C  [facility]

## 2017-02-21 NOTE — Patient Instructions (Signed)

## 2017-03-06 ENCOUNTER — Ambulatory Visit (INDEPENDENT_AMBULATORY_CARE_PROVIDER_SITE_OTHER): Payer: PPO

## 2017-03-06 DIAGNOSIS — I482 Chronic atrial fibrillation, unspecified: Secondary | ICD-10-CM

## 2017-03-06 DIAGNOSIS — I4891 Unspecified atrial fibrillation: Secondary | ICD-10-CM | POA: Diagnosis not present

## 2017-03-06 DIAGNOSIS — I4892 Unspecified atrial flutter: Secondary | ICD-10-CM | POA: Diagnosis not present

## 2017-03-06 DIAGNOSIS — B351 Tinea unguium: Secondary | ICD-10-CM | POA: Diagnosis not present

## 2017-03-06 DIAGNOSIS — E119 Type 2 diabetes mellitus without complications: Secondary | ICD-10-CM | POA: Diagnosis not present

## 2017-03-06 DIAGNOSIS — Z5181 Encounter for therapeutic drug level monitoring: Secondary | ICD-10-CM

## 2017-03-06 DIAGNOSIS — L851 Acquired keratosis [keratoderma] palmaris et plantaris: Secondary | ICD-10-CM | POA: Diagnosis not present

## 2017-03-06 LAB — POCT INR: INR: 5.6

## 2017-03-13 ENCOUNTER — Ambulatory Visit (INDEPENDENT_AMBULATORY_CARE_PROVIDER_SITE_OTHER): Payer: PPO

## 2017-03-13 DIAGNOSIS — I482 Chronic atrial fibrillation, unspecified: Secondary | ICD-10-CM

## 2017-03-13 DIAGNOSIS — I4891 Unspecified atrial fibrillation: Secondary | ICD-10-CM

## 2017-03-13 DIAGNOSIS — Z5181 Encounter for therapeutic drug level monitoring: Secondary | ICD-10-CM | POA: Diagnosis not present

## 2017-03-13 DIAGNOSIS — I4892 Unspecified atrial flutter: Secondary | ICD-10-CM

## 2017-03-13 LAB — POCT INR: INR: 2.2

## 2017-03-21 ENCOUNTER — Ambulatory Visit
Admission: RE | Admit: 2017-03-21 | Discharge: 2017-03-21 | Disposition: A | Discharge status: Home / Self Care | Payer: PPO | Source: Ambulatory Visit | Attending: Physician Assistant | Admitting: Physician Assistant

## 2017-03-21 ENCOUNTER — Ambulatory Visit (INDEPENDENT_AMBULATORY_CARE_PROVIDER_SITE_OTHER): Payer: PPO | Admitting: Physician Assistant

## 2017-03-21 ENCOUNTER — Encounter: Payer: Self-pay | Admitting: Physician Assistant

## 2017-03-21 ENCOUNTER — Telehealth: Payer: Self-pay | Admitting: *Deleted

## 2017-03-21 VITALS — BP 112/74 | HR 103 | Temp 98.2°F | Resp 16 | Wt 183.0 lb

## 2017-03-21 DIAGNOSIS — R509 Fever, unspecified: Secondary | ICD-10-CM

## 2017-03-21 DIAGNOSIS — J90 Pleural effusion, not elsewhere classified: Secondary | ICD-10-CM

## 2017-03-21 DIAGNOSIS — R1011 Right upper quadrant pain: Secondary | ICD-10-CM | POA: Diagnosis not present

## 2017-03-21 DIAGNOSIS — R05 Cough: Secondary | ICD-10-CM

## 2017-03-21 DIAGNOSIS — R059 Cough, unspecified: Secondary | ICD-10-CM

## 2017-03-21 DIAGNOSIS — W57XXXA Bitten or stung by nonvenomous insect and other nonvenomous arthropods, initial encounter: Secondary | ICD-10-CM

## 2017-03-21 DIAGNOSIS — Z8679 Personal history of other diseases of the circulatory system: Secondary | ICD-10-CM | POA: Insufficient documentation

## 2017-03-21 MED ORDER — LEVOFLOXACIN 500 MG PO TABS: 500.0000 mg | ORAL_TABLET | Freq: Every day | ORAL | 0 refills | Status: DC

## 2017-03-21 MED ORDER — WARFARIN SODIUM 5 MG PO TABS: ORAL_TABLET | ORAL | 1 refills | Status: DC

## 2017-03-21 NOTE — Addendum Note (Signed)
Addended by: Fenton Malling on: 03/21/2017 02:09 PM   Modules accepted: Orders

## 2017-03-21 NOTE — Progress Notes (Signed)
Patient: [name] [name] Male    DOB: Kareem 11, 1935   83 y.o.   MRN: [id number] Visit Date: 03/21/2017  Today's Provider: Mar Daring, PA-C   Chief Complaint  Patient presents with  . Cough   Subjective:    Cough  This is a new problem. Episode onset: x 2-3 weeks. The problem has been waxing and waning (pt reports he was feeling improved, but sx worsened last night). The cough is productive of sputum (clear). Associated symptoms include chills, a fever (102 this morning. Temperature is 98.2 in office, after taking 2 Tylenol), nasal congestion, postnasal drip, rhinorrhea and sweats. Pertinent negatives include no chest pain, ear congestion, ear pain, headaches, heartburn, hemoptysis, myalgias, sore throat, shortness of breath or wheezing. Associated symptoms comments: Pt is also c/o early satiety and decreased appetite. Nothing aggravates the symptoms. He has tried nothing for the symptoms. His past medical history is significant for bronchitis and environmental allergies.   Patient also has remote history of tick bites. He found 2 small deer tick on him approximately 4 weeks ago (see previous visit note for 02/21/17) but was treated for 2 weeks with doxycycline.    Allergies  Allergen Reactions  . Macrolides And Ketolides Other (See Comments)  . Mycinettes   . Nitrofuran Derivatives Other (See Comments)  . Nitrofurantoin Other (See Comments)  . Erythromycin Itching and Rash    Other reaction(s): UNKNOWN  And red skin     Current Outpatient Prescriptions:  .  BETA CAROTENE PO, Take 1 capsule by mouth daily., Disp: , Rfl:  .  Calcium-Vitamin D 600-200 MG-UNIT per tablet, Take 1 tablet by mouth daily.  , Disp: , Rfl:  .  co-enzyme Q-10 30 MG capsule, Take 30 mg by mouth daily.  , Disp: , Rfl:  .  digoxin (LANOXIN) 0.125 MG tablet, TAKE ONE TABLET BY MOUTH ONCE DAILY, Disp: 90 tablet, Rfl: 3 .  Glucosamine 500 MG CAPS, Take 1 capsule by mouth daily.  , Disp: , Rfl:  .   glucose blood test strip, , Disp: , Rfl:  .  latanoprost (XALATAN) 0.005 % ophthalmic solution, Place 1 drop into both eyes at bedtime. , Disp: , Rfl:  .  levothyroxine (SYNTHROID, LEVOTHROID) 50 MCG tablet, TAKE ONE TABLET BY MOUTH ONCE DAILY, Disp: 90 tablet, Rfl: 3 .  metFORMIN (GLUCOPHAGE) 500 MG tablet, TAKE ONE TABLET BY MOUTH ONCE DAILY, Disp: 90 tablet, Rfl: 3 .  ONE TOUCH ULTRA TEST test strip, USE ONE STRIP TO CHECK GLUCOSE ONCE DAILY, Disp: 100 each, Rfl: 12 .  ONETOUCH DELICA LANCETS 70W MISC, USE TO TEST BLOOD SUGAR ONCE DAILY, Disp: 100 each, Rfl: 12 .  POTASSIUM PO, Take 1 tablet by mouth daily.  , Disp: , Rfl:  .  simvastatin (ZOCOR) 40 MG tablet, TAKE ONE-HALF TABLET BY MOUTH AT BEDTIME, Disp: 45 tablet, Rfl: 3 .  verapamil (CALAN-SR) 240 MG CR tablet, Take 1 tablet (240 mg total) by mouth daily., Disp: 180 tablet, Rfl: 3 .  vitamin B-12 (CYANOCOBALAMIN) 500 MCG tablet, Take 500 mcg by mouth daily.  , Disp: , Rfl:  .  vitamin C (ASCORBIC ACID) 500 MG tablet, Take 500 mg by mouth daily.  , Disp: , Rfl:  .  warfarin (COUMADIN) 5 MG tablet, Take as directed by [facility] (Patient taking differently: Take as directed by [facility]), Disp: 90 tablet, Rfl: 1 .  fish oil-omega-3 fatty acids 1000 MG capsule, Take 2 g by  mouth 2 (two) times daily.  , Disp: , Rfl:  .  Gelatin Capsules, Empty, CAPS, 2 capsules by Does not apply route 2 (two) times daily. , Disp: , Rfl:   Review of Systems  Constitutional: Positive for appetite change (Reports feeling of satiety sooner than he used to), chills and fever (102 this morning. Temperature is 98.2 in office, after taking 2 Tylenol).  HENT: Positive for postnasal drip and rhinorrhea. Negative for congestion, ear pain, sinus pain, sinus pressure, sneezing, sore throat and tinnitus.   Respiratory: Positive for cough. Negative for hemoptysis, chest tightness, shortness of breath and wheezing.   Cardiovascular: Negative for chest pain,  palpitations and leg swelling.  Gastrointestinal: Negative for abdominal pain, constipation, diarrhea, heartburn and nausea.  Musculoskeletal: Negative for myalgias.  Allergic/Immunologic: Positive for environmental allergies.  Neurological: Negative for dizziness and headaches.    Social History  Substance Use Topics  . Smoking status: Former Smoker    Types: Cigars, Cigarettes    Quit date: 10/02/1974  . Smokeless tobacco: Never Used     Comment: still smokes cigars  . Alcohol use Yes     Comment: rare - 1 beer   Objective:   BP 112/74 (BP Location: Right Arm, Patient Position: Sitting, Cuff Size: Large)   Pulse (!) 103   Temp 98.2 F (36.8 C) (Oral)   Resp 16   Wt 183 lb (83 kg)   SpO2 94%   BMI 27.02 kg/m  Vitals:   03/21/17 1134  BP: 112/74  Pulse: (!) 103  Resp: 16  Temp: 98.2 F (36.8 C)  TempSrc: Oral  SpO2: 94%  Weight: 183 lb (83 kg)     Physical Exam  Constitutional: He appears well-developed and well-nourished. No distress.  HENT:  Head: Normocephalic and atraumatic.  Right Ear: Hearing, tympanic membrane, external ear and ear canal normal. Tympanic membrane is not erythematous and not bulging. No middle ear effusion.  Left Ear: Hearing, tympanic membrane, external ear and ear canal normal. Tympanic membrane is not erythematous and not bulging.  No middle ear effusion.  Nose: Nose normal. No mucosal edema or rhinorrhea. Right sinus exhibits no maxillary sinus tenderness and no frontal sinus tenderness. Left sinus exhibits no maxillary sinus tenderness and no frontal sinus tenderness.  Mouth/Throat: Uvula is midline, oropharynx is clear and moist and mucous membranes are normal. No oropharyngeal exudate, posterior oropharyngeal edema or posterior oropharyngeal erythema.  Eyes: Conjunctivae and EOM are normal. Pupils are equal, round, and reactive to light. Right eye exhibits no discharge. Left eye exhibits no discharge.  Neck: Normal range of motion. Neck  supple. No JVD present. No tracheal deviation present. No Brudzinski's sign and no Kernig's sign noted. No thyromegaly present.  Cardiovascular: Normal rate.  An irregularly irregular rhythm present. Exam reveals no gallop and no friction rub.   Murmur heard. Pulmonary/Chest: Effort normal and breath sounds normal. No stridor. No respiratory distress. He has no wheezes. He has no rales.  Abdominal: Soft. Normal appearance and bowel sounds are normal. He exhibits no shifting dullness, no distension, no fluid wave, no abdominal bruit, no ascites, no pulsatile midline mass and no mass. There is no hepatosplenomegaly. There is tenderness in the right upper quadrant. There is no rebound, no guarding and no CVA tenderness.  Lymphadenopathy:    He has no cervical adenopathy.  Skin: Skin is warm and dry. He is not diaphoretic.  Vitals reviewed.       Assessment & Plan:     1.  Cough Productive of clear sputum. No SOB sensation. Improved from previous visit. Patient states he does not feel this is a respiratory infection, unlike last time. Will check labs as below for WBC elevation. CXR to R/O pneumonia. I will f/u pending results.  - CBC with Differential - Comprehensive Metabolic Panel (CMET) - Lipase - DG Chest 2 View; Future  2. Fever, unspecified fever cause Unknown cause. Only had one fever of 102 with chills that broke this morning, patient reports awakening sweating and no fever since. Will check labs as below. Tick borne titers I am checking due to recent tick exposure even though patient was treated with doxycycline to make sure not a tick borne cause. CXR to R/O pneumonia. CMP to check alk phos for obstruction elevation and lipase to check pancreas since he had RUQ pain on exam. No bowel changes per patient. I will f/u with him pending lab results. If labs are normal may consider RUQ [place] to further evaluate for gallbladder involvement since he is having early satiety and RUQ pain. Gastritis is  also on differential and may consider trial PPI.  - CBC with Differential - Comprehensive Metabolic Panel (CMET) - Lipase - B. Burgdorfi Antibodies - [place] mtn spotted fvr abs pnl(IgG+IgM) - DG Chest 2 View; Future  3. Tick bite, initial encounter See above medical treatment plan. - B. Burgdorfi Antibodies - [place] mtn spotted fvr abs pnl(IgG+IgM)  4. RUQ pain See above medical treatment plan. - CBC with Differential - Comprehensive Metabolic Panel (CMET) - Lipase       Mar Daring, PA-C  [facility]

## 2017-03-21 NOTE — Patient Instructions (Signed)
Fever, Adult °A fever is an increase in the body’s temperature. It is usually defined as a temperature of 100°F (38°C) or higher. Brief mild or moderate fevers generally have no long-term effects, and they often do not require treatment. Moderate or high fevers may make you feel uncomfortable and can sometimes be a sign of a serious illness or disease. The sweating that may occur with repeated or prolonged fever may also cause dehydration. °Fever is confirmed by taking a temperature with a thermometer. A measured temperature can vary with: °· Age. °· Time of day. °· Location of the thermometer: °¨ Mouth (oral). °¨ Rectum (rectal). °¨ Ear (tympanic). °¨ Underarm (axillary). °¨ Forehead (temporal). °Follow these instructions at home: °Pay attention to any changes in your symptoms. Take these actions to help with your condition: °· Take over-the counter and prescription medicines only as told by your health care provider. Follow the dosing instructions carefully. °· If you were prescribed an antibiotic medicine, take it as told by your health care provider. Do not stop taking the antibiotic even if you start to feel better. °· Rest as needed. °· Drink enough fluid to keep your urine clear or pale yellow. This helps to prevent dehydration. °· Sponge yourself or bathe with room-temperature water to help reduce your body temperature as needed. Do not use ice water. °· Do not overbundle yourself in blankets or heavy clothes. °Contact a health care provider if: °· You vomit. °· You cannot eat or drink without vomiting. °· You have diarrhea. °· You have pain when you urinate. °· Your symptoms do not improve with treatment. °· You develop new symptoms. °· You develop excessive weakness. °Get help right away if: °· You have shortness of breath or have trouble breathing. °· You are dizzy or you faint. °· You are disoriented or confused. °· You develop signs of dehydration, such as a dry mouth, decreased urination, or  paleness. °· You develop severe pain in your abdomen. °· You have persistent vomiting or diarrhea. °· You develop a skin rash. °· Your symptoms suddenly get worse. °This information is not intended to replace advice given to you by your health care provider. Make sure you discuss any questions you have with your health care provider. °Document Released: 03/27/2001 Document Revised: 03/08/2016 Document Reviewed: 11/25/2014 °Elsevier Interactive Patient Education © 2017 Elsevier Inc. ° °

## 2017-03-21 NOTE — Telephone Encounter (Signed)
Refill sent to pharmacy as requested

## 2017-03-22 ENCOUNTER — Other Ambulatory Visit: Payer: Self-pay | Admitting: Family Medicine

## 2017-03-22 ENCOUNTER — Telehealth: Payer: Self-pay | Admitting: Family Medicine

## 2017-03-22 NOTE — Telephone Encounter (Signed)
Have results come back yet? Please advise. Thanks!

## 2017-03-22 NOTE — Telephone Encounter (Signed)
Pt 's wife called wanting to know [facility] lab results,  Her call back is [phone]  Thanks teri

## 2017-03-22 NOTE — Telephone Encounter (Signed)
Yes, they were notified of xray results yesterday. I will let them know labs have not come in yet.

## 2017-03-22 NOTE — Telephone Encounter (Signed)
Blood work is still pending. Did they get called about the xray results? If not it showed pleural effusion bilaterally and I have sent in [place]. He may need his INR checked sooner to adjust as this may make his warfarin more strong.

## 2017-03-24 ENCOUNTER — Other Ambulatory Visit: Payer: Self-pay | Admitting: Family Medicine

## 2017-03-25 ENCOUNTER — Other Ambulatory Visit: Payer: Self-pay

## 2017-03-25 ENCOUNTER — Telehealth: Payer: Self-pay

## 2017-03-25 DIAGNOSIS — E119 Type 2 diabetes mellitus without complications: Secondary | ICD-10-CM

## 2017-03-25 LAB — CBC WITH DIFFERENTIAL/PLATELET
BASOS ABS: 0 10*3/uL (ref 0.0–0.2)
Basos: 1 %
EOS (ABSOLUTE): 0.4 10*3/uL (ref 0.0–0.4)
Eos: 5 %
Hematocrit: 41 % (ref 37.5–51.0)
Hemoglobin: 13.8 g/dL (ref 13.0–17.7)
Immature Grans (Abs): 0.1 10*3/uL (ref 0.0–0.1)
Immature Granulocytes: 1 %
Lymphocytes Absolute: 2.7 10*3/uL (ref 0.7–3.1)
Lymphs: 33 %
MCH: 31.4 pg (ref 26.6–33.0)
MCHC: 33.7 g/dL (ref 31.5–35.7)
MCV: 93 fL (ref 79–97)
MONOCYTES: 8 %
Monocytes Absolute: 0.6 10*3/uL (ref 0.1–0.9)
NEUTROS ABS: 4.3 10*3/uL (ref 1.4–7.0)
NEUTROS PCT: 52 %
PLATELETS: 264 10*3/uL (ref 150–379)
RBC: 4.4 x10E6/uL (ref 4.14–5.80)
RDW: 14.7 % (ref 12.3–15.4)
WBC: 8.2 10*3/uL (ref 3.4–10.8)

## 2017-03-25 LAB — ROCKY MTN SPOTTED FVR ABS PNL(IGG+IGM)
RMSF IGG: NEGATIVE
RMSF IGM: 1.08 {index} — AB (ref 0.00–0.89)

## 2017-03-25 LAB — COMPREHENSIVE METABOLIC PANEL
ALK PHOS: 65 IU/L (ref 39–117)
ALT: 21 IU/L (ref 0–44)
AST: 29 IU/L (ref 0–40)
Albumin/Globulin Ratio: 1.2 (ref 1.2–2.2)
Albumin: 3.8 g/dL (ref 3.5–4.7)
BILIRUBIN TOTAL: 1.2 mg/dL (ref 0.0–1.2)
BUN/Creatinine Ratio: 16 (ref 10–24)
BUN: 15 mg/dL (ref 8–27)
CO2: 24 mmol/L (ref 18–29)
CREATININE: 0.93 mg/dL (ref 0.76–1.27)
Calcium: 9.1 mg/dL (ref 8.6–10.2)
Chloride: 101 mmol/L (ref 96–106)
GFR calc Af Amer: 89 mL/min/{1.73_m2} (ref >59)
GFR calc non Af Amer: 77 mL/min/{1.73_m2} (ref >59)
GLUCOSE: 202 mg/dL — AB (ref 65–99)
Globulin, Total: 3.1 g/dL (ref 1.5–4.5)
Potassium: 4.6 mmol/L (ref 3.5–5.2)
Sodium: 140 mmol/L (ref 134–144)
TOTAL PROTEIN: 6.9 g/dL (ref 6.0–8.5)

## 2017-03-25 LAB — B. BURGDORFI ANTIBODIES: Lyme IgG/IgM Ab: <0.91 {ISR} (ref 0.00–0.90)

## 2017-03-25 LAB — LIPASE: LIPASE: 50 U/L (ref 13–78)

## 2017-03-25 MED ORDER — ONETOUCH DELICA LANCETS 33G MISC: 12 refills | Status: DC

## 2017-03-25 MED ORDER — GLUCOSE BLOOD VI STRP: ORAL_STRIP | 12 refills | Status: DC

## 2017-03-25 NOTE — Telephone Encounter (Signed)
Pt advised. States he is aware of tx plan, as Dr. Rosanna Randy spoke with him yesterday about this. He also requested strips and lancets to be resent to pharmacy. Associated supplies and resent to [facility]. Renaldo Fiddler, CMA

## 2017-03-25 NOTE — Telephone Encounter (Signed)
lmtcb Emily Drozdowski, CMA  

## 2017-03-25 NOTE — Telephone Encounter (Signed)
-----   Message from Mar Daring, [place] sent at 03/25/2017 11:30 AM EDT ----- Labs are fairly unremarkable and stable. RMSF titer did test as equivocal which means you could have contracted RMSF initially but was treated with doxycycline appropriately. Lyme titer is still pending. I will notify you of results of that once they are received. I have spoken with Dr. Rosanna Randy and he agrees that continuing the levaquin for the pleural effusions and then following up in 2 weeks to recheck CXR and possibly rechecking RMSF labs. Please call if symptoms worsen however.

## 2017-03-26 NOTE — Telephone Encounter (Signed)
-----   Message from Mar Daring, [place] sent at 03/25/2017  4:56 PM EDT ----- Lyme is negative

## 2017-03-26 NOTE — Telephone Encounter (Signed)
Patient advised as below. Patient verbalizes understanding and is in agreement with treatment plan.  

## 2017-03-27 ENCOUNTER — Ambulatory Visit (INDEPENDENT_AMBULATORY_CARE_PROVIDER_SITE_OTHER): Payer: PPO

## 2017-03-27 DIAGNOSIS — I4891 Unspecified atrial fibrillation: Secondary | ICD-10-CM | POA: Diagnosis not present

## 2017-03-27 DIAGNOSIS — Z5181 Encounter for therapeutic drug level monitoring: Secondary | ICD-10-CM

## 2017-03-27 DIAGNOSIS — I482 Chronic atrial fibrillation, unspecified: Secondary | ICD-10-CM

## 2017-03-27 DIAGNOSIS — I4892 Unspecified atrial flutter: Secondary | ICD-10-CM | POA: Diagnosis not present

## 2017-03-27 LAB — POCT INR: INR: 3.6

## 2017-03-30 LAB — CBC AND DIFFERENTIAL
HEMATOCRIT: 39 — AB (ref 41–53)
HEMOGLOBIN: 13.5 (ref 13.5–17.5)
NEUTROS ABS: 5
PLATELETS: 319 (ref 150–399)
WBC: 9.5

## 2017-03-30 LAB — BASIC METABOLIC PANEL
BUN: 13 (ref 4–21)
Creatinine: 1 (ref 0.6–1.3)
Glucose: 111
Potassium: 4.2 (ref 3.4–5.3)
SODIUM: 140 (ref 137–147)

## 2017-03-30 LAB — HEPATIC FUNCTION PANEL
ALK PHOS: 84 (ref 25–125)
ALT: 27 (ref 10–40)
AST: 31 (ref 14–40)
Bilirubin, Total: 0.7

## 2017-03-30 LAB — LIPID PANEL
CHOLESTEROL: 137 (ref 0–200)
HDL: 36 (ref 35–70)
LDL CALC: 70
TRIGLYCERIDES: 153 (ref 40–160)

## 2017-03-30 LAB — HEMOGLOBIN A1C: Hemoglobin A1C: 7.4

## 2017-04-08 ENCOUNTER — Ambulatory Visit
Admission: RE | Admit: 2017-04-08 | Discharge: 2017-04-08 | Disposition: A | Discharge status: Home / Self Care | Payer: PPO | Source: Ambulatory Visit | Attending: Family Medicine | Admitting: Family Medicine

## 2017-04-08 ENCOUNTER — Ambulatory Visit (INDEPENDENT_AMBULATORY_CARE_PROVIDER_SITE_OTHER): Payer: PPO | Admitting: Family Medicine

## 2017-04-08 ENCOUNTER — Ambulatory Visit (INDEPENDENT_AMBULATORY_CARE_PROVIDER_SITE_OTHER): Payer: PPO | Admitting: *Deleted

## 2017-04-08 VITALS — BP 108/56 | HR 90 | Temp 97.7°F | Resp 14 | Wt 181.0 lb

## 2017-04-08 DIAGNOSIS — R5383 Other fatigue: Secondary | ICD-10-CM | POA: Insufficient documentation

## 2017-04-08 DIAGNOSIS — I482 Chronic atrial fibrillation, unspecified: Secondary | ICD-10-CM

## 2017-04-08 DIAGNOSIS — C649 Malignant neoplasm of unspecified kidney, except renal pelvis: Secondary | ICD-10-CM

## 2017-04-08 DIAGNOSIS — E119 Type 2 diabetes mellitus without complications: Secondary | ICD-10-CM | POA: Diagnosis not present

## 2017-04-08 DIAGNOSIS — I700 Atherosclerosis of aorta: Secondary | ICD-10-CM

## 2017-04-08 DIAGNOSIS — G4733 Obstructive sleep apnea (adult) (pediatric): Secondary | ICD-10-CM | POA: Diagnosis not present

## 2017-04-08 DIAGNOSIS — I4891 Unspecified atrial fibrillation: Secondary | ICD-10-CM

## 2017-04-08 DIAGNOSIS — R05 Cough: Secondary | ICD-10-CM | POA: Diagnosis not present

## 2017-04-08 DIAGNOSIS — I517 Cardiomegaly: Secondary | ICD-10-CM | POA: Insufficient documentation

## 2017-04-08 DIAGNOSIS — R0609 Other forms of dyspnea: Secondary | ICD-10-CM | POA: Diagnosis not present

## 2017-04-08 DIAGNOSIS — I4892 Unspecified atrial flutter: Secondary | ICD-10-CM

## 2017-04-08 DIAGNOSIS — I10 Essential (primary) hypertension: Secondary | ICD-10-CM | POA: Diagnosis not present

## 2017-04-08 DIAGNOSIS — J90 Pleural effusion, not elsewhere classified: Secondary | ICD-10-CM | POA: Diagnosis not present

## 2017-04-08 DIAGNOSIS — H9193 Unspecified hearing loss, bilateral: Secondary | ICD-10-CM | POA: Diagnosis not present

## 2017-04-08 LAB — POCT INR: INR: 3.6

## 2017-04-08 NOTE — Progress Notes (Signed)
[name] [name]  MRN: [id number] DOB: 1934/04/06  Subjective:  HPI  Patient is here to follow up from last office visit on 03/21/17 when he saw Fenton Malling, PA. Lab work was checked at that time-CBC, MetC, Lipase, RMSF, Lymes. All was stable except question with RMSF. Chest Xray was done and showed small bilateral pleural effusion and patient was started on Levaquin. Patient was to follow up today and repeat chest xray and possibly repeating RMSF levels. Patient states he is feeling better, he does still gets tired faster than usual but every day he is getting better. Slight cough, slight dyspnea. No fever or chills.  Wt Readings from Last 3 Encounters:  04/08/17 181 lb (82.1 kg)  03/21/17 183 lb (83 kg)  02/21/17 184 lb (83.5 kg)    Patient Active Problem List   Diagnosis Date Noted  . H/O: rheumatic fever 03/21/2017  . Tinnitus 10/30/2016  . On warfarin therapy 07/06/2015  . Arteriosclerosis of coronary artery 06/13/2015  . Carcinoma in situ of bladder 06/13/2015  . Diabetes mellitus, type 2 ([place]) 06/13/2015  . Diabetic neuropathy ([place]) 06/13/2015  . Dizziness 06/13/2015  . Essential (primary) hypertension 06/13/2015  . Fatigue 06/13/2015  . Glaucoma 06/13/2015  . Hypercholesteremia 06/13/2015  . Male hypogonadism 06/13/2015  . Adult hypothyroidism 06/13/2015  . Arthritis, degenerative 06/13/2015  . PNA (pneumonia) 06/13/2015  . Adenocarcinoma, renal cell ([place]) 06/13/2015  . Benign head tremor 12/13/2014  . Carotid arterial disease ([place]) 07/12/2014  . Decreased cardiac output 07/12/2014  . Cardiomyopathy, hypertrophic obstructive ([place]) 07/12/2014  . Head injuries 07/12/2014  . HOCM (hypertrophic obstructive cardiomyopathy) ([place])   . Encounter for therapeutic drug monitoring 11/16/2013  . Obstructive sleep apnea   . Benign prostatic hypertrophy without urinary obstruction 06/09/2012  . History of neoplasm of bladder 06/09/2012  . Head injury   . Elevated  liver enzymes   . Atherosclerosis of aorta ([place])   . Hypotension   . Stroke ([place])   . Unsteady gait   . Excessive sweating   . Nausea   . ORTHOSTATIC HYPOTENSION, HX OF 09/01/2010  . Overweight(278.02) 03/07/2009  . MITRAL REGURGITATION 03/05/2009  . Chronic atrial fibrillation ([place]) 03/05/2009  . Atrial flutter ([place]) 03/05/2009  . Mitral and aortic incompetence 03/05/2009    Past Medical History:  Diagnosis Date  . Atherosclerosis of aorta ([place])    by CT scan in past  . Atrial fibrillation ([place])    and aflutter. pt has a left atrial circuit that is not ablated. was on amiodarone-stopped, now use rate control.   . Bladder cancer ([place])    hx; treated with BCG in past   . Carotid artery disease ([place])    There was calcified plaque but no stenosis by carotid artery screening done at [facility] October, 2013  . Diabetes mellitus    type not specified  . Diabetic neuropathy (H[facility])    feet  . Dysrhythmia    A-Fib - Cardio visit 12/13/14 - chart review  . Ejection fraction   . Elevated liver enzymes    over time; hx  . Excessive sweating   . Glaucoma   . Gout   . Head injury    when slipped on ice 2004-2005. stablaized and back on Coumadin   . Headache    migraines - distant past  . HOCM (hypertrophic obstructive cardiomyopathy) (H[facility])    mild SAM of the mitral valve, but no LVOT obstrcution. echo 11/09 EF 55%. [facility] -cho-11/11. mild  regurgitation - echo - ``/09  . Homocystinemia ([place])    elevated, mild   . Hypercholesterolemia    treated.   . Hypertension   . Hypotension    orthostatic. dehydration. hospitalized 11/11  . Kidney mass    laproscopic surgery woth cryoablation of a mass outside kidney followed at [facility].   . Motion sickness    ocean boats  . Nausea   . Obstructive sleep apnea    CPAP started successfully 2014  . Sleep apnea    Significant obstructive sleep apnea diagnosed in August, 2012, the patient is to receive CPAP   . Stroke  [facility])    "2 old strokes" CT and MRI. [facility] 11/11. diagnosis was dehydration, no acutal reports.   . TSH elevation    on synthroid historically   . Unsteady gait    August, 2012    Social History   Social History  . Marital status: Married    Spouse name: N/A  . Number of children: N/A  . Years of education: N/A   Occupational History  . Not on file.   Social History Main Topics  . Smoking status: Former Smoker    Types: Cigars, Cigarettes    Quit date: 10/02/1974  . Smokeless tobacco: Never Used     Comment: still smokes cigars  . Alcohol use Yes     Comment: rare - 1 beer  . Drug use: No  . Sexual activity: Not on file   Other Topics Concern  . Not on file   Social History Narrative   Married, retired, gets regular exercise.     Outpatient Encounter Prescriptions as of 04/08/2017  Medication Sig Note  . BETA CAROTENE PO Take 1 capsule by mouth daily. 05/21/2014: Received from: [place]:   . Calcium-Vitamin D 600-200 MG-UNIT per tablet Take 1 tablet by mouth daily.     [name] co-enzyme Q-10 30 MG capsule Take 30 mg by mouth daily.     . digoxin (LANOXIN) 0.125 MG tablet TAKE ONE TABLET BY MOUTH ONCE DAILY   . fish oil-omega-3 fatty acids 1000 MG capsule Take 2 g by mouth 2 (two) times daily.     . Gelatin Capsules, Empty, CAPS 2 capsules by Does not apply route 2 (two) times daily.    . Glucosamine 500 MG CAPS Take 1 capsule by mouth daily.     [name] glucose blood (ONE TOUCH ULTRA TEST) test strip USE ONE STRIP TO CHECK GLUCOSE ONCE DAILY   . glucose blood test strip  06/13/2015: strips and lancets.  DX: E11.9 Received from: [facility]  . latanoprost (XALATAN) 0.005 % ophthalmic solution Place 1 drop into both eyes at bedtime.  11/16/2013: Received from: External Pharmacy  . levothyroxine (SYNTHROID, LEVOTHROID) 50 MCG tablet TAKE ONE TABLET BY MOUTH ONCE DAILY   . metFORMIN (GLUCOPHAGE) 500 MG tablet TAKE ONE TABLET BY MOUTH ONCE  DAILY   . ONETOUCH DELICA LANCETS 81X MISC USE ONE LANCET TO CHECK BLOOD GLUCOSE ONCE DAILY   . POTASSIUM PO Take 1 tablet by mouth daily.     . simvastatin (ZOCOR) 40 MG tablet TAKE ONE-HALF TABLET BY MOUTH AT BEDTIME   . verapamil (CALAN-SR) 240 MG CR tablet Take 1 tablet (240 mg total) by mouth daily.   . vitamin B-12 (CYANOCOBALAMIN) 500 MCG tablet Take 500 mcg by mouth daily.     . vitamin C (ASCORBIC ACID) 500 MG tablet Take 500 mg by mouth daily.     [name]  warfarin (COUMADIN) 5 MG tablet Take as directed by [facility] Clinic   . [DISCONTINUED] levofloxacin (LEVAQUIN) 500 MG tablet Take 1 tablet (500 mg total) by mouth daily.    No facility-administered encounter medications on file as of 04/08/2017.     Allergies  Allergen Reactions  . Macrolides And Ketolides Other (See Comments)  . Mycinettes   . Nitrofuran Derivatives Other (See Comments)  . Nitrofurantoin Other (See Comments)  . Erythromycin Itching and Rash    Other reaction(s): UNKNOWN  And red skin    Review of Systems  Constitutional: Positive for malaise/fatigue. Negative for chills and fever.  HENT: Positive for hearing loss and tinnitus.   Respiratory: Positive for cough and shortness of breath. Negative for sputum production and wheezing.   Cardiovascular: Negative.   Gastrointestinal: Negative.        Feels full after eating  Musculoskeletal:       Muscle cramps  Neurological: Negative.  Negative for weakness.    Objective:  BP (!) 108/56   Pulse 90   Temp 97.7 F (36.5 C)   Resp 14   Wt 181 lb (82.1 kg)   SpO2 96%   BMI 26.73 kg/m   Physical Exam  Constitutional: He is oriented to person, place, and time and well-developed, well-nourished, and in no distress.  HENT:  Head: Normocephalic and atraumatic.  Eyes: Conjunctivae are normal. Pupils are equal, round, and reactive to light.  Neck: Normal range of motion. Neck supple.  Cardiovascular: Normal rate, regular rhythm, normal heart sounds and intact  distal pulses.  Exam reveals no gallop.   No murmur heard. Pulmonary/Chest: Effort normal and breath sounds normal. No respiratory distress. He has no wheezes.  Neurological: He is alert and oriented to person, place, and time.  Psychiatric: Mood, memory, affect and judgment normal.    Assessment and Plan :  1. Pleural effusion Repeat chest xra today. Patient is feeling better. Since patient is feeling better will not repeat RMSF lab test at this time. Patient advised and agreed with treatment plan. - DG Chest 2 View; Future  2. Chronic atrial fibrillation [facility]) Follows cardiologist.  3. Essential (primary) hypertension Stable.  4. Type 2 diabetes mellitus without complication, without long-term current use of insulin ([place]) Had lab work done through [place] and this is updated in the chart.A1C 2 weeks ago was 7.4.  5. Bilateral hearing loss, unspecified hearing loss type Had work up and getting hearing aids in august 2018. 6.Renal cell carcinoma 7.ASCVD  HPI, Exam and A&P transcribed by Theressa Millard, RMA under direction and in the presence of Miguel Aschoff, MD.

## 2017-04-08 NOTE — Progress Notes (Signed)
Spoke with Eino Farber, PharmD in coumadin clinic. She completed the patient's instructions according to recorded dose.

## 2017-04-09 NOTE — Progress Notes (Signed)
Advised  ED 

## 2017-04-18 ENCOUNTER — Encounter: Payer: Self-pay | Admitting: Family Medicine

## 2017-04-24 ENCOUNTER — Ambulatory Visit (INDEPENDENT_AMBULATORY_CARE_PROVIDER_SITE_OTHER): Payer: PPO | Admitting: *Deleted

## 2017-04-24 DIAGNOSIS — I4891 Unspecified atrial fibrillation: Secondary | ICD-10-CM

## 2017-04-24 DIAGNOSIS — I4892 Unspecified atrial flutter: Secondary | ICD-10-CM | POA: Diagnosis not present

## 2017-04-24 DIAGNOSIS — Z5181 Encounter for therapeutic drug level monitoring: Secondary | ICD-10-CM

## 2017-04-24 DIAGNOSIS — I482 Chronic atrial fibrillation, unspecified: Secondary | ICD-10-CM

## 2017-04-24 LAB — POCT INR: INR: 3.1

## 2017-04-29 ENCOUNTER — Ambulatory Visit: Payer: PPO | Admitting: Family Medicine

## 2017-04-29 ENCOUNTER — Ambulatory Visit: Payer: PPO | Admitting: Cardiovascular Disease

## 2017-04-29 ENCOUNTER — Ambulatory Visit (INDEPENDENT_AMBULATORY_CARE_PROVIDER_SITE_OTHER): Payer: PPO | Admitting: Nurse Practitioner

## 2017-04-29 ENCOUNTER — Encounter: Payer: Self-pay | Admitting: Nurse Practitioner

## 2017-04-29 VITALS — BP 110/66 | HR 85 | Ht 69.0 in | Wt 182.8 lb

## 2017-04-29 DIAGNOSIS — I421 Obstructive hypertrophic cardiomyopathy: Secondary | ICD-10-CM | POA: Diagnosis not present

## 2017-04-29 DIAGNOSIS — I10 Essential (primary) hypertension: Secondary | ICD-10-CM | POA: Diagnosis not present

## 2017-04-29 DIAGNOSIS — I350 Nonrheumatic aortic (valve) stenosis: Secondary | ICD-10-CM | POA: Diagnosis not present

## 2017-04-29 DIAGNOSIS — E782 Mixed hyperlipidemia: Secondary | ICD-10-CM

## 2017-04-29 DIAGNOSIS — I482 Chronic atrial fibrillation, unspecified: Secondary | ICD-10-CM

## 2017-04-29 MED ORDER — SIMVASTATIN 40 MG PO TABS: 20.0000 mg | ORAL_TABLET | Freq: Every day | ORAL | 5 refills | Status: DC

## 2017-04-29 NOTE — Patient Instructions (Signed)
Medication Instructions:  Your physician recommends that you continue on your current medications as directed. Please refer to the Current Medication list given to you today.   Labwork: none  Testing/Procedures: nond  Follow-Up: Your physician wants you to follow-up in: 6 months with Dr. Fletcher Anon.  You will receive a reminder letter in the mail two months in advance. If you don't receive a letter, please call our office to schedule the follow-up appointment.   Any Other Special Instructions Will Be Listed Below (If Applicable).     If you need a refill on your cardiac medications before your next appointment, please call your pharmacy.

## 2017-04-29 NOTE — Progress Notes (Signed)
Office Visit    Patient Name: [name] [name] Date of Encounter: 04/29/2017  Primary Care Provider:  [name], MD Primary Cardiologist:  Jerilynn Mages. Fletcher Anon, MD   Chief Complaint    83 y/o ? with a h/o chronic afib, AS, HOCM, HTN, HL, DMII, OSA on CPAP, and hypothyroidism, who presents for routine follow-up.  Past Medical History    Past Medical History:  Diagnosis Date  . Atherosclerosis of aorta ([place])    by CT scan in past  . Atrial fibrillation ([place])    and aflutter. pt has a left atrial circuit that is not ablated. was on amiodarone-stopped, now use rate control.   . Bladder cancer ([place])    hx; treated with BCG in past   . Carotid artery disease ([place])    There was calcified plaque but no stenosis by carotid artery screening done at [facility] October, 2013  . Diabetes mellitus    type not specified  . Diabetic neuropathy (HCC)    feet  . Elevated liver enzymes    over time; hx  . Excessive sweating   . Glaucoma   . Gout   . Head injury    when slipped on ice 2004-2005. stablaized and back on Coumadin   . Headache    migraines - distant past  . HOCM (hypertrophic obstructive cardiomyopathy) ([place])    a. 01/2016 Echo: EF 65-70%, no rwma, LVOT gradient of 80-60mmHg, mod AS, SAM, sev dil LA, PASP 43mmHg.  [name] Homocystinemia ([place])    elevated, mild   . Hypercholesterolemia    treated.   . Hypertension   . Kidney mass    laproscopic surgery woth cryoablation of a mass outside kidney followed at [facility].   . Moderate aortic stenosis    a. 01/2016 echo: mod AS.   [name] Motion sickness    ocean boats  . Nausea   . Obstructive sleep apnea    CPAP started successfully 2014  . Orthostatic hypotension    a. orthostatic. dehydration. hospitalized 11/11  . Sleep apnea    Significant obstructive sleep apnea diagnosed in August, 2012, the patient is to receive CPAP   . Stroke [facility])    "2 old strokes" CT and MRI. [place] [facility] 11/11. diagnosis was  dehydration, no acutal reports.   . TSH elevation    on synthroid historically   . Unsteady gait    August, 2012   Past Surgical History:  Procedure Laterality Date  . BLADDER SURGERY    . CATARACT EXTRACTION W/PHACO Left 05/11/2015   Procedure: CATARACT EXTRACTION PHACO AND INTRAOCULAR LENS PLACEMENT (IOC);  Surgeon: Leandrew Koyanagi, MD;  Location: [place];  Service: Ophthalmology;  Laterality: Left;  DIABETIC - oral meds, CPAP  . KIDNEY SURGERY     "froze mass"  . TONSILLECTOMY      Allergies  Allergies  Allergen Reactions  . Macrolides And Ketolides Other (See Comments)  . Mycinettes   . Nitrofuran Derivatives Other (See Comments)  . Nitrofurantoin Other (See Comments)  . Erythromycin Itching and Rash    Other reaction(s): UNKNOWN  And red skin    History of Present Illness    83 y/o ? with the above complex PMH including chronic rate-controlled Afib, moderate AS, HOCM, HTN, HL, DM II, OSA, and hypothyroidism.  He was last seen in clinic in 06/2016 @ which time he was doing well.  Last echo was in 01/2016 showing nl EF with moderate AS and LVOT  gradient of 80-50mmHg.  Since his last visit, he has continued to do well.  He and his wife remain reasonably active and travel often.  They recently drove to and from [place], stopping frequently along the way and enjoying their time.  He did note some DOE once in [place] @ 7K feet elevation, esp with walking up stairs, but has since done well.  He remains active in and around his yard and walks often, though doesn't necessarily exercise per se.  He denies chest pain, palpitations, dyspnea, pnd, orthopnea, n, v, dizziness, syncope, edema, weight gain, or early satiety. His wife sometimes notes that he coughs after eating and then lying down.  He denies any issues with swallowing.  Home Medications    Prior to Admission medications   Medication Sig Start Date End Date Taking? Authorizing Provider  BETA CAROTENE PO Take 1  capsule by mouth daily.   Yes [provider]  Calcium-Vitamin D 600-200 MG-UNIT per tablet Take 1 tablet by mouth daily.     Yes [provider]  co-enzyme Q-10 30 MG capsule Take 30 mg by mouth daily.     Yes [provider]  digoxin (LANOXIN) 0.125 MG tablet TAKE ONE TABLET BY MOUTH ONCE DAILY 01/02/17  Yes Wellington Hampshire, MD  fish oil-omega-3 fatty acids 1000 MG capsule Take 2 g by mouth 2 (two) times daily.     Yes [provider]  Gelatin Capsules, Empty, CAPS 2 capsules by Does not apply route 2 (two) times daily.    Yes [provider]  Glucosamine 500 MG CAPS Take 1 capsule by mouth daily.     Yes [provider]  glucose blood (ONE TOUCH ULTRA TEST) test strip USE ONE STRIP TO CHECK GLUCOSE ONCE DAILY 03/25/17  Yes [name], MD  glucose blood test strip  12/08/14  Yes [provider]  latanoprost (XALATAN) 0.005 % ophthalmic solution Place 1 drop into both eyes at bedtime.  11/13/13  Yes [provider]  levothyroxine (SYNTHROID, LEVOTHROID) 50 MCG tablet TAKE ONE TABLET BY MOUTH ONCE DAILY 06/15/16  Yes [name], MD  metFORMIN (GLUCOPHAGE) 500 MG tablet TAKE ONE TABLET BY MOUTH ONCE DAILY 12/27/16  Yes [name], MD  [facility] DELICA LANCETS 93X MISC USE ONE LANCET TO CHECK BLOOD GLUCOSE ONCE DAILY 03/25/17  Yes [name], MD  POTASSIUM PO Take 1 tablet by mouth daily.     Yes [provider]  simvastatin (ZOCOR) 40 MG tablet Take 0.5 tablets (20 mg total) by mouth at bedtime. 04/29/17  Yes Wellington Hampshire, MD  verapamil (CALAN-SR) 240 MG CR tablet Take 1 tablet (240 mg total) by mouth daily. 09/14/16  Yes Wellington Hampshire, MD  vitamin B-12 (CYANOCOBALAMIN) 500 MCG tablet Take 500 mcg by mouth daily.     Yes [provider]  vitamin C (ASCORBIC ACID) 500 MG tablet Take 500 mg by mouth daily.     Yes [provider]  warfarin (COUMADIN) 5 MG  tablet Take as directed by Coumadin Clinic 03/21/17  Yes Wellington Hampshire, MD    Review of Systems    He denies chest pain, palpitations, dyspnea, pnd, orthopnea, n, v, dizziness, syncope, edema, weight gain, or early satiety.  All other systems reviewed and are otherwise negative except as noted above.  Physical Exam    VS:  BP 110/66 (BP Location: Left Arm, Patient Position: Sitting, Cuff Size: Normal)  Pulse 85   Ht 5\' 9"  (1.753 m)   Wt 182 lb 12 oz (82.9 kg)   BMI 26.99 kg/m  , BMI Body mass index is 26.99 kg/m. GEN: Well nourished, well developed, in no acute distress.  HEENT: normal.  Neck: Supple, no JVD, radiated murmur noted in neck, no masses. Cardiac: RRR, 2/6 SEM RUSB, 3/6 SEM LLSB  apex, no rubs, or gallops. No clubbing, cyanosis, edema.  Radials/DP/PT 2+ and equal bilaterally.  Respiratory:  Respirations regular and unlabored, clear to auscultation bilaterally. GI: Soft, nontender, nondistended, BS + x 4. MS: no deformity or atrophy. Skin: warm and dry, no rash. Neuro:  Strength and sensation are intact. Psych: Normal affect.  Accessory Clinical Findings    ECG - afib, 85, inc LBBB, LVH, lateral ST dep - no acute changes.  Assessment & Plan    1.  Chronic atrial fibrillation:  Well rate-controlled on verapamil and digoxin.  Asymptomatic.  Dig level was normal in 06/2016.  INR followed in coumadin clinic.  No changes today.  2.  Moderate AS:  Stable by echo in 01/2016.  Asymptomatic.  Plan to f/u in 01/2018.  3.  HOCM:  Peak LVOT gradient of 80-28mmHg by echo 01/2016.  He has been stable and w/o significant dyspnea.  No h/o presyncope/syncope.  Cont verapamil.  F/u echo 01/2018.  4.  Essential HTN: stable on verapamil.  5.  HL:  LDL 70 (03/2017) on simvastatin.  6.  Dispo:  F/u in 6 months.   Murray Hodgkins, NP 04/29/2017, 1:57 PM

## 2017-05-08 ENCOUNTER — Ambulatory Visit (INDEPENDENT_AMBULATORY_CARE_PROVIDER_SITE_OTHER): Payer: PPO

## 2017-05-08 DIAGNOSIS — I482 Chronic atrial fibrillation, unspecified: Secondary | ICD-10-CM

## 2017-05-08 DIAGNOSIS — I4891 Unspecified atrial fibrillation: Secondary | ICD-10-CM

## 2017-05-08 DIAGNOSIS — I4892 Unspecified atrial flutter: Secondary | ICD-10-CM

## 2017-05-08 DIAGNOSIS — Z5181 Encounter for therapeutic drug level monitoring: Secondary | ICD-10-CM | POA: Diagnosis not present

## 2017-05-08 LAB — POCT INR: INR: 1.8

## 2017-05-29 ENCOUNTER — Ambulatory Visit (INDEPENDENT_AMBULATORY_CARE_PROVIDER_SITE_OTHER): Payer: PPO

## 2017-05-29 DIAGNOSIS — I482 Chronic atrial fibrillation, unspecified: Secondary | ICD-10-CM

## 2017-05-29 DIAGNOSIS — Z5181 Encounter for therapeutic drug level monitoring: Secondary | ICD-10-CM

## 2017-05-29 LAB — POCT INR: INR: 2.2

## 2017-06-04 ENCOUNTER — Other Ambulatory Visit: Payer: Self-pay | Admitting: Cardiovascular Disease

## 2017-06-04 ENCOUNTER — Other Ambulatory Visit: Payer: Self-pay | Admitting: Family Medicine

## 2017-06-04 DIAGNOSIS — E039 Hypothyroidism, unspecified: Secondary | ICD-10-CM

## 2017-06-05 NOTE — Telephone Encounter (Signed)
Please review for refill, thanks ! 

## 2017-06-24 DIAGNOSIS — E119 Type 2 diabetes mellitus without complications: Secondary | ICD-10-CM | POA: Diagnosis not present

## 2017-06-24 DIAGNOSIS — H35371 Puckering of macula, right eye: Secondary | ICD-10-CM | POA: Diagnosis not present

## 2017-06-24 DIAGNOSIS — B351 Tinea unguium: Secondary | ICD-10-CM | POA: Diagnosis not present

## 2017-06-24 LAB — HM DIABETES EYE EXAM

## 2017-06-26 ENCOUNTER — Ambulatory Visit (INDEPENDENT_AMBULATORY_CARE_PROVIDER_SITE_OTHER): Payer: PPO

## 2017-06-26 DIAGNOSIS — I482 Chronic atrial fibrillation, unspecified: Secondary | ICD-10-CM

## 2017-06-26 DIAGNOSIS — I4892 Unspecified atrial flutter: Secondary | ICD-10-CM | POA: Diagnosis not present

## 2017-06-26 DIAGNOSIS — Z5181 Encounter for therapeutic drug level monitoring: Secondary | ICD-10-CM | POA: Diagnosis not present

## 2017-06-26 LAB — POCT INR: INR: 4.3

## 2017-07-03 ENCOUNTER — Encounter: Payer: Self-pay | Admitting: Family Medicine

## 2017-07-10 ENCOUNTER — Ambulatory Visit (INDEPENDENT_AMBULATORY_CARE_PROVIDER_SITE_OTHER): Payer: PPO

## 2017-07-10 DIAGNOSIS — I482 Chronic atrial fibrillation, unspecified: Secondary | ICD-10-CM

## 2017-07-10 DIAGNOSIS — I4892 Unspecified atrial flutter: Secondary | ICD-10-CM | POA: Diagnosis not present

## 2017-07-10 DIAGNOSIS — Z5181 Encounter for therapeutic drug level monitoring: Secondary | ICD-10-CM | POA: Diagnosis not present

## 2017-07-10 LAB — POCT INR: INR: 3.3

## 2017-07-12 DIAGNOSIS — H903 Sensorineural hearing loss, bilateral: Secondary | ICD-10-CM | POA: Diagnosis not present

## 2017-07-12 DIAGNOSIS — H6060 Unspecified chronic otitis externa, unspecified ear: Secondary | ICD-10-CM | POA: Diagnosis not present

## 2017-07-12 DIAGNOSIS — H9209 Otalgia, unspecified ear: Secondary | ICD-10-CM | POA: Diagnosis not present

## 2017-07-12 DIAGNOSIS — H6120 Impacted cerumen, unspecified ear: Secondary | ICD-10-CM | POA: Diagnosis not present

## 2017-07-24 ENCOUNTER — Ambulatory Visit (INDEPENDENT_AMBULATORY_CARE_PROVIDER_SITE_OTHER): Payer: PPO

## 2017-07-24 DIAGNOSIS — Z5181 Encounter for therapeutic drug level monitoring: Secondary | ICD-10-CM

## 2017-07-24 DIAGNOSIS — I482 Chronic atrial fibrillation, unspecified: Secondary | ICD-10-CM

## 2017-07-24 DIAGNOSIS — I4892 Unspecified atrial flutter: Secondary | ICD-10-CM | POA: Diagnosis not present

## 2017-07-24 LAB — POCT INR: INR: 2.8

## 2017-08-02 ENCOUNTER — Telehealth: Payer: Self-pay | Admitting: Cardiovascular Disease

## 2017-08-02 NOTE — Telephone Encounter (Signed)
No, I can't figure out why he would need unless he is requesting brand name.  This goes to Dr. Tyrell Antonio assist

## 2017-08-02 NOTE — Telephone Encounter (Signed)
[name] [name] from [employer] calling to ask if we can do a PA  On patients warfarin   Please advise.

## 2017-08-02 NOTE — Telephone Encounter (Signed)
This is Dr Jacklynn Ganong pt. Will forward to NL Triage.

## 2017-08-02 NOTE — Telephone Encounter (Signed)
Please review for PA on Warfarin.

## 2017-08-05 ENCOUNTER — Telehealth: Payer: Self-pay | Admitting: Family Medicine

## 2017-08-05 ENCOUNTER — Ambulatory Visit (INDEPENDENT_AMBULATORY_CARE_PROVIDER_SITE_OTHER): Payer: PPO

## 2017-08-05 ENCOUNTER — Ambulatory Visit (INDEPENDENT_AMBULATORY_CARE_PROVIDER_SITE_OTHER): Payer: PPO | Admitting: Family Medicine

## 2017-08-05 VITALS — BP 102/64 | HR 88 | Temp 98.2°F | Resp 18 | Wt 183.0 lb

## 2017-08-05 DIAGNOSIS — E119 Type 2 diabetes mellitus without complications: Secondary | ICD-10-CM

## 2017-08-05 DIAGNOSIS — I10 Essential (primary) hypertension: Secondary | ICD-10-CM

## 2017-08-05 DIAGNOSIS — Z5181 Encounter for therapeutic drug level monitoring: Secondary | ICD-10-CM

## 2017-08-05 DIAGNOSIS — R05 Cough: Secondary | ICD-10-CM

## 2017-08-05 DIAGNOSIS — I4892 Unspecified atrial flutter: Secondary | ICD-10-CM | POA: Diagnosis not present

## 2017-08-05 DIAGNOSIS — I482 Chronic atrial fibrillation, unspecified: Secondary | ICD-10-CM

## 2017-08-05 DIAGNOSIS — R059 Cough, unspecified: Secondary | ICD-10-CM

## 2017-08-05 DIAGNOSIS — H9193 Unspecified hearing loss, bilateral: Secondary | ICD-10-CM | POA: Diagnosis not present

## 2017-08-05 DIAGNOSIS — I700 Atherosclerosis of aorta: Secondary | ICD-10-CM

## 2017-08-05 LAB — POCT INR: INR: 2.5

## 2017-08-05 LAB — POCT GLYCOSYLATED HEMOGLOBIN (HGB A1C): Hemoglobin A1C: 6.5

## 2017-08-05 MED ORDER — LEVOFLOXACIN 500 MG PO TABS: 500.0000 mg | ORAL_TABLET | Freq: Every day | ORAL | 0 refills | Status: DC

## 2017-08-05 MED ORDER — WARFARIN SODIUM 5 MG PO TABS: ORAL_TABLET | ORAL | 1 refills | Status: DC

## 2017-08-05 NOTE — Telephone Encounter (Signed)
Pt has INR check today - I will discuss w/ him at that time.

## 2017-08-05 NOTE — Telephone Encounter (Signed)
Error/MW °

## 2017-08-05 NOTE — Telephone Encounter (Signed)
Spoke w/ pt.  He reports that he does not request brand name coumadin, but his ins co has denied payment in the past if a PA was not obtained. Attempted to contact [employer], but the # we have listed "will not go through as dialed". Will resubmit refill to [facility].

## 2017-08-05 NOTE — Progress Notes (Signed)
[name] [name]  MRN: [id number] DOB: 03-22-1934  Subjective:  HPI   Patient is here for follow up. Last A1C was done at [facility] clinic. Patient has not been back to [place], he only has to go once a year. He is checking his sugar in the morning and readings are usually around 130s-140s. Numbness in feet chronic. Lab Results  Component Value Date   HGBA1C 7.4 03/30/2017   Wt Readings from Last 3 Encounters:  08/05/17 183 lb (83 kg)  04/29/17 182 lb 12 oz (82.9 kg)  04/08/17 181 lb (82.1 kg)   Last routine lab work done on 03/30/17. Patient is having sneezing, runny nose and cough since yesterday. He worked in the yard yesterday moving flowers from outside to inside and this started.  Patient got hearing aids since last visit.  Patient Active Problem List   Diagnosis Date Noted  . H/O: rheumatic fever 03/21/2017  . Tinnitus 10/30/2016  . On warfarin therapy 07/06/2015  . Arteriosclerosis of coronary artery 06/13/2015  . Carcinoma in situ of bladder 06/13/2015  . Diabetes mellitus, type 2 ([place]) 06/13/2015  . Diabetic neuropathy ([place]) 06/13/2015  . Dizziness 06/13/2015  . Essential (primary) hypertension 06/13/2015  . Fatigue 06/13/2015  . Glaucoma 06/13/2015  . Hypercholesteremia 06/13/2015  . Male hypogonadism 06/13/2015  . Adult hypothyroidism 06/13/2015  . Arthritis, degenerative 06/13/2015  . PNA (pneumonia) 06/13/2015  . Adenocarcinoma, renal cell ([place]) 06/13/2015  . Benign head tremor 12/13/2014  . Carotid arterial disease ([place]) 07/12/2014  . Decreased cardiac output 07/12/2014  . Cardiomyopathy, hypertrophic obstructive ([place]) 07/12/2014  . Head injuries 07/12/2014  . HOCM (hypertrophic obstructive cardiomyopathy) ([place])   . Encounter for therapeutic drug monitoring 11/16/2013  . Obstructive sleep apnea   . Benign prostatic hypertrophy without urinary obstruction 06/09/2012  . History of neoplasm of bladder 06/09/2012  . Head injury   . Elevated liver enzymes   .  Atherosclerosis of aorta ([place])   . Hypotension   . Stroke ([place])   . Unsteady gait   . Excessive sweating   . Nausea   . ORTHOSTATIC HYPOTENSION, HX OF 09/01/2010  . Overweight(278.02) 03/07/2009  . MITRAL REGURGITATION 03/05/2009  . Chronic atrial fibrillation ([place]) 03/05/2009  . Atrial flutter ([place]) 03/05/2009  . Mitral and aortic incompetence 03/05/2009    Past Medical History:  Diagnosis Date  . Atherosclerosis of aorta ([place])    by CT scan in past  . Atrial fibrillation ([place])    and aflutter. pt has a left atrial circuit that is not ablated. was on amiodarone-stopped, now use rate control.   . Bladder cancer ([place])    hx; treated with BCG in past   . Carotid artery disease ([place])    There was calcified plaque but no stenosis by carotid artery screening done at [facility] October, 2013  . Diabetes mellitus    type not specified  . Diabetic neuropathy (HCC)    feet  . Elevated liver enzymes    over time; hx  . Excessive sweating   . Glaucoma   . Gout   . Head injury    when slipped on ice 2004-2005. stablaized and back on Coumadin   . Headache    migraines - distant past  . HOCM (hypertrophic obstructive cardiomyopathy) ([place])    a. 01/2016 Echo: EF 65-70%, no rwma, LVOT gradient of 80-57mmHg, mod AS, SAM, sev dil LA, PASP 63mmHg.  [name] Homocystinemia ([place])    elevated, mild   . Hypercholesterolemia  treated.   . Hypertension   . Kidney mass    laproscopic surgery woth cryoablation of a mass outside kidney followed at [facility].   . Moderate aortic stenosis    a. 01/2016 echo: mod AS.   [name] Motion sickness    ocean boats  . Nausea   . Obstructive sleep apnea    CPAP started successfully 2014  . Orthostatic hypotension    a. orthostatic. dehydration. hospitalized 11/11  . Sleep apnea    Significant obstructive sleep apnea diagnosed in August, 2012, the patient is to receive CPAP   . Stroke [facility])    "2 old strokes" CT and MRI. [place] [facility] 11/11.  diagnosis was dehydration, no acutal reports.   . TSH elevation    on synthroid historically   . Unsteady gait    August, 2012    Social History   Social History  . Marital status: Married    Spouse name: N/A  . Number of children: N/A  . Years of education: N/A   Occupational History  . Not on file.   Social History Main Topics  . Smoking status: Former Smoker    Types: Cigars, Cigarettes    Quit date: 10/02/1974  . Smokeless tobacco: Never Used     Comment: still smokes cigars once a day  . Alcohol use Yes     Comment: rare - 1 beer  . Drug use: No  . Sexual activity: Not on file   Other Topics Concern  . Not on file   Social History Narrative   Married, retired, gets regular exercise.     Outpatient Encounter Prescriptions as of 08/05/2017  Medication Sig Note  . BETA CAROTENE PO Take 1 capsule by mouth daily. 05/21/2014: Received from: [facility]:   . Calcium-Vitamin D 600-200 MG-UNIT per tablet Take 1 tablet by mouth daily.     [name] co-enzyme Q-10 30 MG capsule Take 30 mg by mouth daily.     . digoxin (LANOXIN) 0.125 MG tablet TAKE ONE TABLET BY MOUTH ONCE DAILY   . fish oil-omega-3 fatty acids 1000 MG capsule Take 2 g by mouth 2 (two) times daily.     . Gelatin Capsules, Empty, CAPS 2 capsules by Does not apply route 2 (two) times daily.    . Glucosamine 500 MG CAPS Take 1 capsule by mouth daily.     [name] glucose blood (ONE TOUCH ULTRA TEST) test strip USE ONE STRIP TO CHECK GLUCOSE ONCE DAILY   . glucose blood test strip  06/13/2015: strips and lancets.  DX: E11.9 Received from: [facility]  . latanoprost (XALATAN) 0.005 % ophthalmic solution Place 1 drop into both eyes at bedtime.  11/16/2013: Received from: External Pharmacy  . levothyroxine (SYNTHROID, LEVOTHROID) 50 MCG tablet TAKE ONE TABLET BY MOUTH ONCE DAILY   . metFORMIN (GLUCOPHAGE) 500 MG tablet TAKE ONE TABLET BY MOUTH ONCE DAILY   . ONETOUCH DELICA LANCETS 94R MISC USE ONE  LANCET TO CHECK BLOOD GLUCOSE ONCE DAILY   . POTASSIUM PO Take 1 tablet by mouth daily.     . simvastatin (ZOCOR) 40 MG tablet Take 0.5 tablets (20 mg total) by mouth at bedtime.   . verapamil (CALAN-SR) 240 MG CR tablet Take 1 tablet (240 mg total) by mouth daily.   . vitamin B-12 (CYANOCOBALAMIN) 500 MCG tablet Take 500 mcg by mouth daily.     . vitamin C (ASCORBIC ACID) 500 MG tablet Take 500 mg by mouth daily.     [name]  warfarin (COUMADIN) 5 MG tablet TAKE AS DIRECTED BY  COUMADIN  CLINIC    No facility-administered encounter medications on file as of 08/05/2017.     Allergies  Allergen Reactions  . Macrolides And Ketolides Other (See Comments)  . Mycinettes   . Nitrofuran Derivatives Other (See Comments)  . Nitrofurantoin Other (See Comments)  . Erythromycin Itching and Rash    Other reaction(s): UNKNOWN  And red skin    Review of Systems  Constitutional: Negative.   HENT: Positive for congestion and hearing loss.        Runny nose, sneezing  Eyes: Negative.   Respiratory: Positive for cough and sputum production.   Cardiovascular: Negative.   Gastrointestinal: Negative.   Musculoskeletal: Negative.   Neurological: Negative.   Endo/Heme/Allergies: Negative.   Psychiatric/Behavioral: Negative.     Objective:  BP 102/64   Pulse 88   Temp 98.2 F (36.8 C)   Resp 18   Wt 183 lb (83 kg)   SpO2 97%   BMI 27.02 kg/m   Physical Exam  Constitutional: He is oriented to person, place, and time and well-developed, well-nourished, and in no distress.  HENT:  Head: Normocephalic and atraumatic.  Right Ear: External ear normal.  Left Ear: External ear normal.  Nose: Nose normal.  Mouth/Throat: Oropharynx is clear and moist.  Eyes: Pupils are equal, round, and reactive to light. Conjunctivae are normal. No scleral icterus.  Neck: Normal range of motion. Neck supple. No thyromegaly present.  Cardiovascular: Normal rate, regular rhythm, normal heart sounds and intact distal  pulses.  Exam reveals no gallop.   No murmur heard. Pulmonary/Chest: Effort normal and breath sounds normal. No respiratory distress. He has no wheezes.  Abdominal: Soft.  Neurological: He is alert and oriented to person, place, and time. Gait normal. GCS score is 15.  Skin: Skin is warm and dry.  Psychiatric: Mood, memory, affect and judgment normal.   Assessment and Plan :  1. Type 2 diabetes mellitus without complication, without long-term current use of insulin (HCC) 6.5. Much better. Continue working on habits and continue current medication. - POCT HgB A1C  2. Chronic atrial fibrillation [facility]) Follows cardiologist. INR checked with cardiologist regularly.  3. Essential (primary) hypertension Stable.  4. Bilateral hearing loss, unspecified hearing loss type Doing well with hearing aids.  5. Atherosclerosis of aorta (HCC) Risk factors treated. 6. Cough I think this is allergy related from working in the yard. Advised patient to try Loratadine, Mucinex for the cough. Follow as needed. I do not think antibiotic is needed at this time but since patient is leaving out of town for 1 week will provide RX for Levaquin just in case. Patient advised that best not to take antibiotic due to him been on warfarin. 7.RCC HPI, Exam and A&P transcribed by Tiffany Kocher, RMA under direction and in the presence of Miguel Aschoff, MD. I have done the exam and reviewed the chart and it is accurate to the best of my knowledge. Development worker, community has been used and  any errors in dictation or transcription are unintentional. Miguel Aschoff M.D. [facility]

## 2017-08-21 ENCOUNTER — Encounter: Payer: PPO | Admitting: Urology

## 2017-08-21 ENCOUNTER — Encounter: Payer: Self-pay | Admitting: Urology

## 2017-08-27 NOTE — Progress Notes (Signed)
The patient was scheduled for an office visit and is due for annual surveillance cystoscopy.  He was unable to wait to have procedure done today and rescheduled.

## 2017-08-28 ENCOUNTER — Ambulatory Visit (INDEPENDENT_AMBULATORY_CARE_PROVIDER_SITE_OTHER): Payer: PPO

## 2017-08-28 DIAGNOSIS — Z5181 Encounter for therapeutic drug level monitoring: Secondary | ICD-10-CM | POA: Diagnosis not present

## 2017-08-28 DIAGNOSIS — I482 Chronic atrial fibrillation, unspecified: Secondary | ICD-10-CM

## 2017-08-28 DIAGNOSIS — I4892 Unspecified atrial flutter: Secondary | ICD-10-CM

## 2017-08-28 LAB — POCT INR: INR: 1.8

## 2017-08-30 ENCOUNTER — Ambulatory Visit: Payer: PPO | Admitting: Urology

## 2017-08-30 VITALS — BP 124/81 | HR 88 | Ht 69.0 in | Wt 183.3 lb

## 2017-08-30 DIAGNOSIS — Z8603 Personal history of neoplasm of uncertain behavior: Secondary | ICD-10-CM | POA: Diagnosis not present

## 2017-08-30 MED ORDER — CIPROFLOXACIN HCL 500 MG PO TABS
500.0000 mg | ORAL_TABLET | Freq: Once | ORAL | Status: AC
  Administered 2017-08-30: 500 mg via ORAL

## 2017-08-30 MED ORDER — LIDOCAINE HCL 2 % EX GEL
1.0000 "application " | Freq: Once | CUTANEOUS | Status: AC
  Administered 2017-08-30: 1 via URETHRAL

## 2017-08-31 LAB — MICROSCOPIC EXAMINATION
EPITHELIAL CELLS (NON RENAL): NONE SEEN /HPF (ref 0–10)
RBC MICROSCOPIC, UA: NONE SEEN /HPF (ref 0–?)

## 2017-08-31 LAB — URINALYSIS, COMPLETE
BILIRUBIN UA: NEGATIVE
Glucose, UA: NEGATIVE
LEUKOCYTES UA: NEGATIVE
NITRITE UA: NEGATIVE
PH UA: 6 (ref 5.0–7.5)
RBC UA: NEGATIVE
Specific Gravity, UA: 1.025 (ref 1.005–1.030)
Urobilinogen, Ur: 0.2 mg/dL (ref 0.2–1.0)

## 2017-08-31 NOTE — Progress Notes (Signed)
   08/31/17  CC:  Chief Complaint  Patient presents with  . Cysto    HAF:[id number] in situ of the bladder diagnosed in 2002. Received BCG and has had no recurrences. He has no complaints. Denies irritative voiding symptoms or hematuria.   Blood pressure 124/81, pulse 88, height 5\' 9"  (1.753 m), weight 183 lb 4.8 oz (83.1 kg).  Cystoscopy Procedure Note  Patient identification was confirmed, informed consent was obtained, and patient was prepped using Betadine solution.  Lidocaine jelly was administered per urethral meatus.    Preoperative abx where received prior to procedure.     Pre-Procedure: - Inspection reveals a normal caliber ureteral meatus.  Procedure: The flexible cystoscope was introduced without difficulty - No urethral strictures/lesions are present. - Enlarged prostate, moderate lateral lobes - Normal bladder neck - Bilateral ureteral orifices identified - Bladder mucosa  reveals no ulcers, tumors, or lesions - No bladder stones - No trabeculation  Retroflexion shows no abnormalities   Post-Procedure: - Patient tolerated the procedure well  Assessment/ Plan: F/U cysto 1 year

## 2017-09-03 ENCOUNTER — Other Ambulatory Visit: Payer: Self-pay | Admitting: Cardiovascular Disease

## 2017-09-18 ENCOUNTER — Other Ambulatory Visit: Payer: Self-pay

## 2017-09-18 ENCOUNTER — Encounter: Payer: Self-pay | Admitting: Emergency Medicine

## 2017-09-18 ENCOUNTER — Inpatient Hospital Stay
Admission: EM | Admit: 2017-09-18 | Discharge: 2017-09-21 | DRG: 194 | Disposition: A | Discharge status: Home Health | Payer: PPO | Attending: Internal Medicine | Admitting: Internal Medicine

## 2017-09-18 ENCOUNTER — Ambulatory Visit (INDEPENDENT_AMBULATORY_CARE_PROVIDER_SITE_OTHER): Payer: PPO

## 2017-09-18 ENCOUNTER — Telehealth: Payer: Self-pay | Admitting: Cardiovascular Disease

## 2017-09-18 ENCOUNTER — Emergency Department: Payer: PPO

## 2017-09-18 DIAGNOSIS — Z79890 Hormone replacement therapy (postmenopausal): Secondary | ICD-10-CM | POA: Diagnosis not present

## 2017-09-18 DIAGNOSIS — I4892 Unspecified atrial flutter: Secondary | ICD-10-CM | POA: Diagnosis present

## 2017-09-18 DIAGNOSIS — J189 Pneumonia, unspecified organism: Principal | ICD-10-CM | POA: Diagnosis present

## 2017-09-18 DIAGNOSIS — E1140 Type 2 diabetes mellitus with diabetic neuropathy, unspecified: Secondary | ICD-10-CM | POA: Diagnosis not present

## 2017-09-18 DIAGNOSIS — Z5181 Encounter for therapeutic drug level monitoring: Secondary | ICD-10-CM

## 2017-09-18 DIAGNOSIS — F17210 Nicotine dependence, cigarettes, uncomplicated: Secondary | ICD-10-CM | POA: Diagnosis present

## 2017-09-18 DIAGNOSIS — E785 Hyperlipidemia, unspecified: Secondary | ICD-10-CM | POA: Diagnosis present

## 2017-09-18 DIAGNOSIS — I482 Chronic atrial fibrillation, unspecified: Secondary | ICD-10-CM

## 2017-09-18 DIAGNOSIS — G4733 Obstructive sleep apnea (adult) (pediatric): Secondary | ICD-10-CM | POA: Diagnosis not present

## 2017-09-18 DIAGNOSIS — J90 Pleural effusion, not elsewhere classified: Secondary | ICD-10-CM | POA: Diagnosis not present

## 2017-09-18 DIAGNOSIS — Z8673 Personal history of transient ischemic attack (TIA), and cerebral infarction without residual deficits: Secondary | ICD-10-CM

## 2017-09-18 DIAGNOSIS — Z8551 Personal history of malignant neoplasm of bladder: Secondary | ICD-10-CM | POA: Diagnosis not present

## 2017-09-18 DIAGNOSIS — E119 Type 2 diabetes mellitus without complications: Secondary | ICD-10-CM | POA: Diagnosis not present

## 2017-09-18 DIAGNOSIS — E7800 Pure hypercholesterolemia, unspecified: Secondary | ICD-10-CM | POA: Diagnosis present

## 2017-09-18 DIAGNOSIS — I2510 Atherosclerotic heart disease of native coronary artery without angina pectoris: Secondary | ICD-10-CM | POA: Diagnosis present

## 2017-09-18 DIAGNOSIS — Z7901 Long term (current) use of anticoagulants: Secondary | ICD-10-CM

## 2017-09-18 DIAGNOSIS — H409 Unspecified glaucoma: Secondary | ICD-10-CM | POA: Diagnosis present

## 2017-09-18 DIAGNOSIS — R0602 Shortness of breath: Secondary | ICD-10-CM | POA: Diagnosis not present

## 2017-09-18 DIAGNOSIS — I10 Essential (primary) hypertension: Secondary | ICD-10-CM | POA: Diagnosis not present

## 2017-09-18 DIAGNOSIS — I4891 Unspecified atrial fibrillation: Secondary | ICD-10-CM | POA: Diagnosis present

## 2017-09-18 DIAGNOSIS — I700 Atherosclerosis of aorta: Secondary | ICD-10-CM | POA: Diagnosis present

## 2017-09-18 DIAGNOSIS — Z7984 Long term (current) use of oral hypoglycemic drugs: Secondary | ICD-10-CM | POA: Diagnosis not present

## 2017-09-18 DIAGNOSIS — I421 Obstructive hypertrophic cardiomyopathy: Secondary | ICD-10-CM | POA: Diagnosis not present

## 2017-09-18 DIAGNOSIS — Z79899 Other long term (current) drug therapy: Secondary | ICD-10-CM

## 2017-09-18 DIAGNOSIS — R0902 Hypoxemia: Secondary | ICD-10-CM | POA: Diagnosis not present

## 2017-09-18 DIAGNOSIS — I152 Hypertension secondary to endocrine disorders: Secondary | ICD-10-CM | POA: Diagnosis present

## 2017-09-18 DIAGNOSIS — E039 Hypothyroidism, unspecified: Secondary | ICD-10-CM | POA: Diagnosis not present

## 2017-09-18 LAB — CBC
HEMATOCRIT: 40.4 % (ref 40.0–52.0)
HEMOGLOBIN: 13.7 g/dL (ref 13.0–18.0)
MCH: 31.2 pg (ref 26.0–34.0)
MCHC: 33.8 g/dL (ref 32.0–36.0)
MCV: 92.1 fL (ref 80.0–100.0)
Platelets: 199 10*3/uL (ref 150–440)
RBC: 4.39 MIL/uL — ABNORMAL LOW (ref 4.40–5.90)
RDW: 14.1 % (ref 11.5–14.5)
WBC: 11 10*3/uL — ABNORMAL HIGH (ref 3.8–10.6)

## 2017-09-18 LAB — BASIC METABOLIC PANEL
Anion gap: 12 (ref 5–15)
BUN: 21 mg/dL — AB (ref 6–20)
CHLORIDE: 98 mmol/L — AB (ref 101–111)
CO2: 23 mmol/L (ref 22–32)
Calcium: 8.9 mg/dL (ref 8.9–10.3)
Creatinine, Ser: 1.19 mg/dL (ref 0.61–1.24)
GFR calc Af Amer: >60 mL/min (ref >60)
GFR calc non Af Amer: 55 mL/min — ABNORMAL LOW (ref >60)
GLUCOSE: 253 mg/dL — AB (ref 65–99)
POTASSIUM: 4.1 mmol/L (ref 3.5–5.1)
Sodium: 133 mmol/L — ABNORMAL LOW (ref 135–145)

## 2017-09-18 LAB — LACTIC ACID, PLASMA: Lactic Acid, Venous: 1.8 mmol/L (ref 0.5–1.9)

## 2017-09-18 LAB — URINALYSIS, ROUTINE W REFLEX MICROSCOPIC
Bacteria, UA: NONE SEEN
Bilirubin Urine: NEGATIVE
GLUCOSE, UA: 50 mg/dL — AB
KETONES UR: NEGATIVE mg/dL
LEUKOCYTES UA: NEGATIVE
NITRITE: NEGATIVE
PH: 5 (ref 5.0–8.0)
Protein, ur: 100 mg/dL — AB
SPECIFIC GRAVITY, URINE: 1.018 (ref 1.005–1.030)
SQUAMOUS EPITHELIAL / LPF: NONE SEEN

## 2017-09-18 LAB — PROTIME-INR
INR: 1.95
Prothrombin Time: 22.1 seconds — ABNORMAL HIGH (ref 11.4–15.2)

## 2017-09-18 LAB — INFLUENZA PANEL BY PCR (TYPE A & B)
INFLBPCR: NEGATIVE
Influenza A By PCR: NEGATIVE

## 2017-09-18 LAB — POCT INR: INR: 2.3

## 2017-09-18 LAB — TROPONIN I: Troponin I: 0.03 ng/mL (ref <0.03)

## 2017-09-18 LAB — GLUCOSE, CAPILLARY: GLUCOSE-CAPILLARY: 239 mg/dL — AB (ref 65–99)

## 2017-09-18 MED ORDER — DIGOXIN 125 MCG PO TABS
125.0000 ug | ORAL_TABLET | Freq: Every day | ORAL | Status: DC
  Administered 2017-09-19 – 2017-09-20 (×2): 125 ug via ORAL
  Filled 2017-09-18 (×3): qty 1

## 2017-09-18 MED ORDER — ONDANSETRON HCL 4 MG/2ML IJ SOLN: 4.0000 mg | Freq: Four times a day (QID) | INTRAMUSCULAR | Status: DC | PRN

## 2017-09-18 MED ORDER — ONDANSETRON HCL 4 MG PO TABS: 4.0000 mg | ORAL_TABLET | Freq: Four times a day (QID) | ORAL | Status: DC | PRN

## 2017-09-18 MED ORDER — DEXTROSE 5 % IV SOLN
2.0000 g | INTRAVENOUS | Status: DC
  Filled 2017-09-18: qty 2

## 2017-09-18 MED ORDER — GUAIFENESIN-DM 100-10 MG/5ML PO SYRP
5.0000 mL | ORAL_SOLUTION | ORAL | Status: DC | PRN
  Administered 2017-09-18: 5 mL via ORAL
  Filled 2017-09-18 (×2): qty 5

## 2017-09-18 MED ORDER — ACETAMINOPHEN 325 MG PO TABS
650.0000 mg | ORAL_TABLET | Freq: Four times a day (QID) | ORAL | Status: DC | PRN
  Administered 2017-09-19 (×2): 650 mg via ORAL
  Filled 2017-09-18 (×2): qty 2

## 2017-09-18 MED ORDER — VANCOMYCIN HCL IN DEXTROSE 1-5 GM/200ML-% IV SOLN
1000.0000 mg | Freq: Once | INTRAVENOUS | Status: AC
  Administered 2017-09-18: 1000 mg via INTRAVENOUS
  Filled 2017-09-18: qty 200

## 2017-09-18 MED ORDER — VANCOMYCIN HCL IN DEXTROSE 1-5 GM/200ML-% IV SOLN: 1000.0000 mg | Freq: Once | INTRAVENOUS | Status: DC

## 2017-09-18 MED ORDER — LEVOFLOXACIN IN D5W 750 MG/150ML IV SOLN: 750.0000 mg | Freq: Once | INTRAVENOUS | Status: DC

## 2017-09-18 MED ORDER — SIMVASTATIN 20 MG PO TABS
20.0000 mg | ORAL_TABLET | Freq: Every day | ORAL | Status: DC
  Administered 2017-09-19 – 2017-09-20 (×2): 20 mg via ORAL
  Filled 2017-09-18 (×2): qty 1

## 2017-09-18 MED ORDER — LEVOTHYROXINE SODIUM 50 MCG PO TABS
50.0000 ug | ORAL_TABLET | Freq: Every day | ORAL | Status: DC
  Administered 2017-09-19 – 2017-09-21 (×3): 50 ug via ORAL
  Filled 2017-09-18 (×3): qty 1

## 2017-09-18 MED ORDER — INSULIN ASPART 100 UNIT/ML ~~LOC~~ SOLN
0.0000 [IU] | Freq: Three times a day (TID) | SUBCUTANEOUS | Status: DC
  Administered 2017-09-19 – 2017-09-20 (×4): 2 [IU] via SUBCUTANEOUS
  Administered 2017-09-20 – 2017-09-21 (×3): 1 [IU] via SUBCUTANEOUS
  Filled 2017-09-18 (×7): qty 1

## 2017-09-18 MED ORDER — DEXTROSE 5 % IV SOLN
2.0000 g | Freq: Once | INTRAVENOUS | Status: AC
  Administered 2017-09-18: 2 g via INTRAVENOUS
  Filled 2017-09-18: qty 2

## 2017-09-18 MED ORDER — LATANOPROST 0.005 % OP SOLN
1.0000 [drp] | Freq: Every day | OPHTHALMIC | Status: DC
  Administered 2017-09-19 – 2017-09-20 (×2): 1 [drp] via OPHTHALMIC
  Filled 2017-09-18: qty 2.5

## 2017-09-18 MED ORDER — VERAPAMIL HCL ER 240 MG PO TBCR
240.0000 mg | EXTENDED_RELEASE_TABLET | Freq: Every day | ORAL | Status: DC
  Administered 2017-09-19 – 2017-09-20 (×2): 240 mg via ORAL
  Filled 2017-09-18 (×3): qty 1

## 2017-09-18 MED ORDER — IPRATROPIUM-ALBUTEROL 0.5-2.5 (3) MG/3ML IN SOLN: 3.0000 mL | RESPIRATORY_TRACT | Status: DC | PRN

## 2017-09-18 MED ORDER — ACETAMINOPHEN 650 MG RE SUPP: 650.0000 mg | Freq: Four times a day (QID) | RECTAL | Status: DC | PRN

## 2017-09-18 MED ORDER — VANCOMYCIN HCL IN DEXTROSE 750-5 MG/150ML-% IV SOLN
750.0000 mg | Freq: Two times a day (BID) | INTRAVENOUS | Status: DC
  Administered 2017-09-19: 06:00:00 750 mg via INTRAVENOUS
  Filled 2017-09-18 (×2): qty 150

## 2017-09-18 MED ORDER — INSULIN ASPART 100 UNIT/ML ~~LOC~~ SOLN
0.0000 [IU] | Freq: Every day | SUBCUTANEOUS | Status: DC
  Administered 2017-09-18: 2 [IU] via SUBCUTANEOUS
  Filled 2017-09-18: qty 1

## 2017-09-18 NOTE — ED Notes (Signed)
When pt falls asleep, oxygen levels drop to high 80's. Raised nasal cannula to 6 L. Will continue to monitor.

## 2017-09-18 NOTE — H&P (Signed)
[facility] NAME: [name] [name]    MR#:  [id number]  DATE OF BIRTH:  05-Aug-1934  DATE OF ADMISSION:  09/18/2017  PRIMARY CARE PHYSICIAN: [name], MD   REQUESTING/REFERRING PHYSICIAN: Clearnce Hasten, MD  CHIEF COMPLAINT:   Chief Complaint  Patient presents with  . Shortness of Breath    HISTORY OF PRESENT ILLNESS:  [name] [name]  is a 83 y.o. male who presents with several days of increasing cough, with acute onset shortness of breath today.  When he arrived to the ED he was initially hypoxic, requiring oxygen via nasal cannula.  Chest x-ray shows likely pneumonia.  Hospitalist were called for admission  PAST MEDICAL HISTORY:   Past Medical History:  Diagnosis Date  . Atherosclerosis of aorta ([place])    by CT scan in past  . Atrial fibrillation ([place])    and aflutter. pt has a left atrial circuit that is not ablated. was on amiodarone-stopped, now use rate control.   . Bladder cancer ([place])    hx; treated with BCG in past   . Carotid artery disease ([place])    There was calcified plaque but no stenosis by carotid artery screening done at [facility] October, 2013  . Diabetes mellitus    type not specified  . Diabetic neuropathy (HCC)    feet  . Elevated liver enzymes    over time; hx  . Excessive sweating   . Glaucoma   . Gout   . Head injury    when slipped on ice 2004-2005. stablaized and back on Coumadin   . Headache    migraines - distant past  . HOCM (hypertrophic obstructive cardiomyopathy) ([place])    a. 01/2016 Echo: EF 65-70%, no rwma, LVOT gradient of 80-39mmHg, mod AS, SAM, sev dil LA, PASP 26mmHg.  [name] Homocystinemia ([place])    elevated, mild   . Hypercholesterolemia    treated.   . Hypertension   . Kidney mass    laproscopic surgery woth cryoablation of a mass outside kidney followed at [facility].   . Moderate aortic stenosis    a. 01/2016 echo: mod AS.   [name] Motion sickness    ocean boats  . Nausea   . Obstructive sleep apnea    CPAP started successfully 2014  . Orthostatic hypotension    a. orthostatic. dehydration. hospitalized 11/11  . Sleep apnea    Significant obstructive sleep apnea diagnosed in August, 2012, the patient is to receive CPAP   . Stroke [facility])    "2 old strokes" CT and MRI.  hospital 11/11. diagnosis was dehydration, no acutal reports.   . TSH elevation    on synthroid historically   . Unsteady gait    August, 2012    PAST SURGICAL HISTORY:   Past Surgical History:  Procedure Laterality Date  . BLADDER SURGERY    . CATARACT EXTRACTION W/PHACO Left 05/11/2015   Procedure: CATARACT EXTRACTION PHACO AND INTRAOCULAR LENS PLACEMENT (IOC);  Surgeon: Leandrew Koyanagi, MD;  Location: [facility];  Service: Ophthalmology;  Laterality: Left;  DIABETIC - oral meds, CPAP  . KIDNEY SURGERY     "froze mass"  . TONSILLECTOMY      SOCIAL HISTORY:   Social History   Tobacco Use  . Smoking status: Current Every Day Smoker    Types: Cigars, Cigarettes    Last attempt to quit: 10/02/1974    Years since quitting: 42.9  . Smokeless tobacco: Never  Used  . Tobacco comment: still smokes cigars once a day  Substance Use Topics  . Alcohol use: Yes    Comment: rare - 1 beer    FAMILY HISTORY:   Family History  Problem Relation Age of Onset  . Arrhythmia Father        A-Fib  . Arrhythmia Brother        A-Fib  . Heart attack Neg Hx   . Hypertension Neg Hx   . Stroke Neg Hx     DRUG ALLERGIES:   Allergies  Allergen Reactions  . Macrolides And Ketolides Other (See Comments)  . Mycinettes   . Nitrofuran Derivatives Other (See Comments)  . Nitrofurantoin Other (See Comments)  . Erythromycin Itching and Rash    Other reaction(s): UNKNOWN  And red skin    MEDICATIONS AT HOME:   Prior to Admission medications   Medication Sig Start Date End Date Taking? Authorizing Provider  Calcium-Vitamin D 600-200 MG-UNIT per  tablet Take 1 tablet by mouth daily.     Yes [provider]  co-enzyme Q-10 30 MG capsule Take 30 mg by mouth daily.     Yes [provider]  digoxin (LANOXIN) 0.125 MG tablet TAKE ONE TABLET BY MOUTH ONCE DAILY 01/02/17  Yes Wellington Hampshire, MD  fish oil-omega-3 fatty acids 1000 MG capsule Take 2 g by mouth 2 (two) times daily.     Yes [provider]  latanoprost (XALATAN) 0.005 % ophthalmic solution Place 1 drop into both eyes at bedtime.  11/13/13  Yes [provider]  levothyroxine (SYNTHROID, LEVOTHROID) 50 MCG tablet TAKE ONE TABLET BY MOUTH ONCE DAILY 06/05/17  Yes [name], MD  metFORMIN (GLUCOPHAGE) 500 MG tablet TAKE ONE TABLET BY MOUTH ONCE DAILY 12/27/16  Yes [name], MD  POTASSIUM PO Take 1 tablet by mouth daily.     Yes [provider]  simvastatin (ZOCOR) 40 MG tablet Take 0.5 tablets (20 mg total) by mouth at bedtime. 04/29/17  Yes Wellington Hampshire, MD  verapamil (CALAN-SR) 240 MG CR tablet Take 1 tablet (240 mg total) by mouth daily. 09/14/16  Yes Wellington Hampshire, MD  vitamin B-12 (CYANOCOBALAMIN) 500 MCG tablet Take 500 mcg by mouth daily.     Yes [provider]  vitamin C (ASCORBIC ACID) 500 MG tablet Take 500 mg by mouth daily.     Yes [provider]  warfarin (COUMADIN) 2.5 MG tablet Take 2.5 mg by mouth as directed. On Sundays and Wednesdays   Yes [provider]  warfarin (COUMADIN) 5 MG tablet TAKE AS DIRECTED BY  COUMADIN  CLINIC Patient taking differently: Take 5 mg by mouth daily. TAKE AS DIRECTED BY  COUMADIN  CLINIC 08/05/17  Yes Wellington Hampshire, MD  Gelatin Capsules, Empty, CAPS 2 capsules by Does not apply route 2 (two) times daily.     [provider]  Glucosamine 500 MG CAPS Take 1 capsule by mouth daily.      [provider]  glucose blood (ONE TOUCH ULTRA TEST) test strip USE ONE STRIP TO CHECK GLUCOSE ONCE DAILY 03/25/17   [name], MD  glucose blood test strip  12/08/14   [provider]  [facility] DELICA LANCETS 62B MISC USE ONE LANCET TO CHECK BLOOD GLUCOSE ONCE DAILY 03/25/17   [name], MD  verapamil (CALAN-SR) 240 MG CR tablet TAKE ONE TABLET BY MOUTH ONCE DAILY Patient not taking:  Reported on 09/18/2017 09/03/17   Wellington Hampshire, MD    REVIEW OF SYSTEMS:  Review of Systems  Constitutional: Positive for malaise/fatigue. Negative for chills, fever and weight loss.  HENT: Negative for ear pain, hearing loss and tinnitus.   Eyes: Negative for blurred vision, double vision, pain and redness.  Respiratory: Positive for cough and shortness of breath. Negative for hemoptysis.   Cardiovascular: Negative for chest pain, palpitations, orthopnea and leg swelling.  Gastrointestinal: Negative for abdominal pain, constipation, diarrhea, nausea and vomiting.  Genitourinary: Negative for dysuria, frequency and hematuria.  Musculoskeletal: Negative for back pain, joint pain and neck pain.  Skin:       No acne, rash, or lesions  Neurological: Positive for weakness. Negative for dizziness, tremors and focal weakness.  Endo/Heme/Allergies: Negative for polydipsia. Does not bruise/bleed easily.  Psychiatric/Behavioral: Negative for depression. The patient is not nervous/anxious and does not have insomnia.      VITAL SIGNS:   Vitals:   09/18/17 2030 09/18/17 2103 09/18/17 2130 09/18/17 2200  BP: (!) 146/64 (!) 151/52 (!) 137/59 (!) 147/62  Pulse: 98 95 94 97  Resp: 19 (!) 37 (!) 34 (!) 32  Temp:      TempSrc:      SpO2: 94% 93% 93% 91%  Weight:      Height:       Wt Readings from Last 3 Encounters:  09/18/17 79.4 kg (175 lb)  08/30/17 83.1 kg (183 lb 4.8 oz)  08/21/17 79.4 kg (175 lb)    PHYSICAL EXAMINATION:  Physical Exam  Vitals reviewed. Constitutional: He is oriented to person, place, and time. He appears well-developed and well-nourished. No distress.  HENT:  Head: Normocephalic  and atraumatic.  Mouth/Throat: Oropharynx is clear and moist.  Eyes: Conjunctivae and EOM are normal. Pupils are equal, round, and reactive to light. No scleral icterus.  Neck: Normal range of motion. Neck supple. No JVD present. No thyromegaly present.  Cardiovascular: Normal rate, regular rhythm and intact distal pulses. Exam reveals no gallop and no friction rub.  No murmur heard. Respiratory: Effort normal. No respiratory distress. He has wheezes. He has no rales.  Rhonchi  GI: Soft. Bowel sounds are normal. He exhibits no distension. There is no tenderness.  Musculoskeletal: Normal range of motion. He exhibits no edema.  No arthritis, no gout  Lymphadenopathy:    He has no cervical adenopathy.  Neurological: He is alert and oriented to person, place, and time. No cranial nerve deficit.  No dysarthria, no aphasia  Skin: Skin is warm and dry. No rash noted. No erythema.  Psychiatric: He has a normal mood and affect. His behavior is normal. Judgment and thought content normal.    LABORATORY PANEL:   CBC Recent Labs  Lab 09/18/17 2022  WBC 11.0*  HGB 13.7  HCT 40.4  PLT 199   ------------------------------------------------------------------------------------------------------------------  Chemistries  Recent Labs  Lab 09/18/17 2022  NA 133*  K 4.1  CL 98*  CO2 23  GLUCOSE 253*  BUN 21*  CREATININE 1.19  CALCIUM 8.9   ------------------------------------------------------------------------------------------------------------------  Cardiac Enzymes Recent Labs  Lab 09/18/17 2022  TROPONINI 0.03*   ------------------------------------------------------------------------------------------------------------------  RADIOLOGY:  Dg Chest Portable 1 View  Result Date: 09/18/2017 CLINICAL DATA:  Shortness of breath.  History of atrial fibrillation EXAM: PORTABLE CHEST 1 VIEW COMPARISON:  April 08, 2017 FINDINGS: There is fibrotic change in the lung bases. There is no  frank edema or consolidation. Heart is upper normal in size with pulmonary  vascularity within normal limits. No adenopathy. No evident bone lesions. There is aortic atherosclerosis. IMPRESSION: Fibrotic change in the bases. No edema or consolidation. Heart upper normal in size. There is aortic atherosclerosis. Aortic Atherosclerosis (ICD10-I70.0). Electronically Signed   By: Lowella Grip III M.D.   On: 09/18/2017 20:38    EKG:   Orders placed or performed during the hospital encounter of 09/18/17  . ED EKG  . ED EKG  . EKG 12-Lead  . EKG 12-Lead  . ED EKG 12-Lead  . ED EKG 12-Lead    IMPRESSION AND PLAN:  Principal Problem:   CAP (community acquired pneumonia) -IV antibiotics, lactic acid normal, blood pressure stable, cultures sent from the ED Active Problems:   Atrial fibrillation and flutter (HCC) -home dose rate controlling medications   Arteriosclerosis of coronary artery -troponin mildly elevated, suspect this is due to demand from his tachycardia from his A. fib, we will trend his cardiac enzymes   Diabetes mellitus, type 2 (HCC) -sliding scale insulin with corresponding glucose checks   Essential (primary) hypertension -continue home meds   Obstructive sleep apnea -BiPAP nightly   Adult hypothyroidism -home dose thyroid replacement  All the records are reviewed and case discussed with ED provider. Management plans discussed with the patient and/or family.  DVT PROPHYLAXIS: Systemic anticoagulation  GI PROPHYLAXIS: None  ADMISSION STATUS: Inpatient  CODE STATUS: Full Code Status History    This patient does not have a recorded code status. Please follow your organizational policy for patients in this situation.    Advance Directive Documentation     Most Recent Value  Type of Advance Directive  Living will, Healthcare Power of Attorney  Pre-existing out of facility DNR order (yellow form or pink MOST form)  No data  "MOST" Form in Place?  No data      TOTAL  TIME TAKING CARE OF THIS PATIENT: 45 minutes.   Jannifer Franklin, Zona Pedro Mitchell 09/18/2017, 10:13 PM  [employer]  [phone]  CC: Primary care physician; [name], MD  Note:  This document was prepared using Dragon voice recognition software and may include unintentional dictation errors.

## 2017-09-18 NOTE — Patient Instructions (Signed)
Please continue dosage of  1 tablet everyday except 1/2 tablet on Sundays and Fridays.   Recheck in 4 weeks.

## 2017-09-18 NOTE — ED Notes (Signed)
2 IV attempts unsuccessful. Veins blew both time. Was able to collect blood at this time.

## 2017-09-18 NOTE — ED Provider Notes (Signed)
[facility] Emergency Department Provider Note  ____________________________________________   First MD Initiated Contact with Patient 09/18/17 2020     (approximate)  I have reviewed the triage vital signs and the nursing notes.   HISTORY  Chief Complaint Shortness of Breath   HPI [name] [name] is a 83 y.o. male with a history of atherosclerosis of the aorta as well as atrial flutter on Coumadin who is presenting to the emergency department with 1 day of chills as well as cough and Reiger's.  He states that he also had a fever at home of 100.  Denying any pain.  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   Past Medical History:  Diagnosis Date  . Atherosclerosis of aorta ([place])    by CT scan in past  . Atrial fibrillation ([place])    and aflutter. pt has a left atrial circuit that is not ablated. was on amiodarone-stopped, now use rate control.   . Bladder cancer ([place])    hx; treated with BCG in past   . Carotid artery disease ([place])    There was calcified plaque but no stenosis by carotid artery screening done at [facility] October, 2013  . Diabetes mellitus    type not specified  . Diabetic neuropathy (HCC)    feet  . Elevated liver enzymes    over time; hx  . Excessive sweating   . Glaucoma   . Gout   . Head injury    when slipped on ice 2004-2005. stablaized and back on Coumadin   . Headache    migraines - distant past  . HOCM (hypertrophic obstructive cardiomyopathy) ([place])    a. 01/2016 Echo: EF 65-70%, no rwma, LVOT gradient of 80-23mmHg, mod AS, SAM, sev dil LA, PASP 39mmHg.  [name] Homocystinemia ([place])    elevated, mild   . Hypercholesterolemia    treated.   . Hypertension   . Kidney mass    laproscopic surgery woth cryoablation of a mass outside kidney followed at [facility].   . Moderate aortic stenosis    a. 01/2016 echo: mod AS.   [name] Motion sickness    ocean boats  . Nausea   .  Obstructive sleep apnea    CPAP started successfully 2014  . Orthostatic hypotension    a. orthostatic. dehydration. hospitalized 11/11  . Sleep apnea    Significant obstructive sleep apnea diagnosed in August, 2012, the patient is to receive CPAP   . Stroke [facility])    "2 old strokes" CT and MRI. [place] [facility] 11/11. diagnosis was dehydration, no acutal reports.   . TSH elevation    on synthroid historically   . Unsteady gait    August, 2012    Patient Active Problem List   Diagnosis Date Noted  . H/O: rheumatic fever 03/21/2017  . Tinnitus 10/30/2016  . On warfarin therapy 07/06/2015  . Arteriosclerosis of coronary artery 06/13/2015  . Carcinoma in situ of bladder 06/13/2015  . Diabetes mellitus, type 2 ([place]) 06/13/2015  . Diabetic neuropathy ([place]) 06/13/2015  . Dizziness 06/13/2015  . Essential (primary) hypertension 06/13/2015  . Fatigue 06/13/2015  . Glaucoma 06/13/2015  . Hypercholesteremia 06/13/2015  . Male hypogonadism 06/13/2015  . Adult hypothyroidism 06/13/2015  . Arthritis, degenerative 06/13/2015  . PNA (pneumonia) 06/13/2015  . Adenocarcinoma, renal cell ([place]) 06/13/2015  . Benign head tremor 12/13/2014  . Carotid arterial disease ([place]) 07/12/2014  . Decreased cardiac output 07/12/2014  .  Cardiomyopathy, hypertrophic obstructive ([place]) 07/12/2014  . Head injuries 07/12/2014  . HOCM (hypertrophic obstructive cardiomyopathy) ([place])   . Encounter for therapeutic drug monitoring 11/16/2013  . Obstructive sleep apnea   . Benign prostatic hypertrophy without urinary obstruction 06/09/2012  . History of neoplasm of bladder 06/09/2012  . Head injury   . Elevated liver enzymes   . Atherosclerosis of aorta ([place])   . Hypotension   . Stroke ([place])   . Unsteady gait   . Excessive sweating   . Nausea   . ORTHOSTATIC HYPOTENSION, HX OF 09/01/2010  . Overweight(278.02) 03/07/2009  . MITRAL REGURGITATION 03/05/2009  . Chronic atrial fibrillation ([place]) 03/05/2009  .  Atrial flutter ([place]) 03/05/2009  . Mitral and aortic incompetence 03/05/2009    Past Surgical History:  Procedure Laterality Date  . BLADDER SURGERY    . CATARACT EXTRACTION W/PHACO Left 05/11/2015   Procedure: CATARACT EXTRACTION PHACO AND INTRAOCULAR LENS PLACEMENT (IOC);  Surgeon: Leandrew Koyanagi, MD;  Location: [facility];  Service: Ophthalmology;  Laterality: Left;  DIABETIC - oral meds, CPAP  . KIDNEY SURGERY     "froze mass"  . TONSILLECTOMY      Prior to Admission medications   Medication Sig Start Date End Date Taking? Authorizing Provider  Calcium-Vitamin D 600-200 MG-UNIT per tablet Take 1 tablet by mouth daily.      [provider]  co-enzyme Q-10 30 MG capsule Take 30 mg by mouth daily.      [provider]  digoxin (LANOXIN) 0.125 MG tablet TAKE ONE TABLET BY MOUTH ONCE DAILY 01/02/17   Wellington Hampshire, MD  fish oil-omega-3 fatty acids 1000 MG capsule Take 2 g by mouth 2 (two) times daily.      [provider]  Gelatin Capsules, Empty, CAPS 2 capsules by Does not apply route 2 (two) times daily.     [provider]  Glucosamine 500 MG CAPS Take 1 capsule by mouth daily.      [provider]  glucose blood (ONE TOUCH ULTRA TEST) test strip USE ONE STRIP TO CHECK GLUCOSE ONCE DAILY 03/25/17   [name], MD  glucose blood test strip  12/08/14   [provider]  latanoprost (XALATAN) 0.005 % ophthalmic solution Place 1 drop into both eyes at bedtime.  11/13/13   [provider]  levothyroxine (SYNTHROID, LEVOTHROID) 50 MCG tablet TAKE ONE TABLET BY MOUTH ONCE DAILY 06/05/17   [name], MD  metFORMIN (GLUCOPHAGE) 500 MG tablet TAKE ONE TABLET BY MOUTH ONCE DAILY 12/27/16   [name], MD  [facility] DELICA LANCETS 93O MISC USE ONE LANCET TO CHECK BLOOD GLUCOSE ONCE DAILY 03/25/17   [name], MD  POTASSIUM PO Take 1 tablet by mouth daily.      [provider]  simvastatin (ZOCOR) 40 MG tablet Take 0.5 tablets (20 mg total) by mouth at bedtime. 04/29/17   Wellington Hampshire, MD  verapamil (CALAN-SR) 240 MG CR tablet Take 1 tablet (240 mg total) by mouth daily. 09/14/16   Wellington Hampshire, MD  verapamil (CALAN-SR) 240 MG CR tablet TAKE ONE TABLET BY MOUTH ONCE DAILY 09/03/17   Wellington Hampshire, MD  vitamin B-12 (CYANOCOBALAMIN) 500 MCG tablet Take 500 mcg by mouth daily.      [provider]  vitamin C (ASCORBIC ACID) 500 MG tablet Take 500 mg by mouth daily.      [provider]  warfarin (COUMADIN)  5 MG tablet TAKE AS DIRECTED BY  COUMADIN  CLINIC 08/05/17   Wellington Hampshire, MD    Allergies Macrolides and ketolides; Mycinettes; Nitrofuran derivatives; Nitrofurantoin; and Erythromycin  Family History  Problem Relation Age of Onset  . Arrhythmia Father        A-Fib  . Arrhythmia Brother        A-Fib  . Heart attack Neg Hx   . Hypertension Neg Hx   . Stroke Neg Hx     Social History Social History   Tobacco Use  . Smoking status: Current Every Day Smoker    Types: Cigars, Cigarettes    Last attempt to quit: 10/02/1974    Years since quitting: 42.9  . Smokeless tobacco: Never Used  . Tobacco comment: still smokes cigars once a day  Substance Use Topics  . Alcohol use: Yes    Comment: rare - 1 beer  . Drug use: No    Review of Systems  Constitutional: As above Eyes: No visual changes. ENT: No sore throat. Cardiovascular: Denies chest pain. Respiratory: As above  gastrointestinal: No abdominal pain.  No nausea, no vomiting.  No diarrhea.  No constipation. Genitourinary: Negative for dysuria. Musculoskeletal: Negative for back pain. Skin: Negative for rash. Neurological: Negative for headaches, focal weakness or numbness.   ____________________________________________   PHYSICAL EXAM:  VITAL SIGNS: ED Triage Vitals  Enc Vitals Group     BP 09/18/17 2019 (!) 146/53     Pulse Rate  09/18/17 2017 (!) 101     Resp 09/18/17 2019 (!) 22     Temp 09/18/17 2019 99.4 F (37.4 C)     Temp Source 09/18/17 2019 Oral     SpO2 09/18/17 2017 (!) 86 %     Weight 09/18/17 2017 175 lb (79.4 kg)     Height 09/18/17 2017 5\' 9"  (1.753 m)     Head Circumference --      Peak Flow --      Pain Score --      Pain Loc --      Pain Edu? --      Excl. in [place]? --     Constitutional: Alert and oriented. Well appearing and in no acute distress. Eyes: Conjunctivae are normal.  Head: Atraumatic. Nose: No congestion/rhinnorhea. Mouth/Throat: Mucous membranes are moist.  Neck: No stridor.   Cardiovascular: Normal rate, irregularly irregular rhythm. Grossly normal heart sounds.   Respiratory: Normal respiratory effort.  No retractions.  Right lower field rails.  Wearing nasal cannula. Gastrointestinal: Soft and nontender. No distention. No CVA tenderness. Musculoskeletal: No lower extremity tenderness nor edema.  No joint effusions. Neurologic:  Normal speech and language. No gross focal neurologic deficits are appreciated. Skin:  Skin is warm, dry and intact. No rash noted. Psychiatric: Mood and affect are normal. Speech and behavior are normal.  ____________________________________________   LABS (all labs ordered are listed, but only abnormal results are displayed)  Labs Reviewed  CBC - Abnormal; Notable for the following components:      Result Value   WBC 11.0 (*)    RBC 4.39 (*)    All other components within normal limits  TROPONIN I - Abnormal; Notable for the following components:   Troponin I 0.03 (*)    All other components within normal limits  BASIC METABOLIC PANEL - Abnormal; Notable for the following components:   Sodium 133 (*)    Chloride 98 (*)    Glucose, Bld 253 (*)  BUN 21 (*)    GFR calc non Af Amer 55 (*)    All other components within normal limits  PROTIME-INR - Abnormal; Notable for the following components:   Prothrombin Time 22.1 (*)    All other  components within normal limits  CULTURE, BLOOD (ROUTINE X 2)  CULTURE, BLOOD (ROUTINE X 2)  URINALYSIS, ROUTINE W REFLEX MICROSCOPIC  LACTIC ACID, PLASMA  LACTIC ACID, PLASMA  INFLUENZA PANEL BY PCR (TYPE A & B)   ____________________________________________  EKG  ED ECG REPORT I, Doran Stabler, the attending physician, personally viewed and interpreted this ECG.   Date: 09/18/2017  EKG Time: 2019  Rate: 96  Rhythm: atrial fibrillation, rate 96  Axis: Normal  Intervals:none  ST&T Change: T wave inversions in 1, aVL, V4 through V6 with minimal depressions in V4 through V6 as well as 2 3 and aVF. No significant change from previous on the record ____________________________________________  RADIOLOGY  Fibrotic change in the bases no edema or consolidation noted on the official read ____________________________________________   PROCEDURES  Procedure(s) performed:   Procedures  Critical Care performed:   ____________________________________________   INITIAL IMPRESSION / ASSESSMENT AND PLAN / ED COURSE  Pertinent labs & imaging results that were available during my care of the patient were reviewed by me and considered in my medical decision making (see chart for details).  Differential includes, but is not limited to, viral syndrome, bronchitis including COPD exacerbation, pneumonia, reactive airway disease including asthma, CHF including exacerbation with or without pulmonary/interstitial edema, pneumothorax, ACS, thoracic trauma, and pulmonary embolism.  As part of my medical decision making, I reviewed the following data within the [facility] chart reviewed  ----------------------------------------- 9:22 PM on 09/18/2017 -----------------------------------------  Sepsis alert called.  Suspecting pneumonia.  However, flu swab ordered as well.  Patient is in the window for Tamiflu.  Patient with multiple allergies including macrolides.   We will not give a fluoroquinolone because the patient is on Coumadin.  Therefore the patient will receive Vanco and cefepime.  Will be treated for suspected pneumonia.  Patient hypoxic to 86% initially and now satting well on nasal cannula family as well as the patient understanding of the diagnosis and the plan.  Signed out to the hospitalist, Dr. Jannifer Franklin.        ____________________________________________   FINAL CLINICAL IMPRESSION(S) / ED DIAGNOSES  Community-acquired pneumonia.  Hypoxia.    NEW MEDICATIONS STARTED DURING THIS VISIT:  This SmartLink is deprecated. Use AVSMEDLIST instead to display the medication list for a patient.   Note:  This document was prepared using Dragon voice recognition software and may include unintentional dictation errors.     Orbie Pyo, MD 09/18/17 2123

## 2017-09-18 NOTE — ED Notes (Signed)
Urinal placed on side rail. Family at bedside. Pt attempting to sleep.

## 2017-09-18 NOTE — ED Triage Notes (Addendum)
Pt ambulatory to STAT desk without difficulty or distress noted; c/o SHOB today; pulse ox 89% RA; taken immed to room 6 via w/c to be placed on card monitor for EKG and further evaluationreports prod cough beige sputum; denies any c/o pain; +smoker; denies hx COPD; placed on O2 at 4l/minn via [place] to bring sat to 95%

## 2017-09-18 NOTE — Telephone Encounter (Signed)
Left message on pt's cell VM to contact the office. Attempted to reach patient at home number as well. No answer, no voice mail.

## 2017-09-18 NOTE — Progress Notes (Addendum)
Pharmacy Antibiotic Note  [name] [name] is a 83 y.o. male admitted on 09/18/2017 with pneumonia.  Pharmacy has been consulted for vanc/cefepime dosing.  Plan: Patient received vanc 1g and cefepime 2g IV x 1 in ED  Will f/u w/ vanc 750 mg IV q12h w/ 8 hr stack dose. Will draw vanc trough 12/7 @ 1700 prior to 4th dose. Will continue cefepime 2g IV q24h Will start levaquin 750 mg q48h per CrCl 20 - 49 ml/min QTc 448 on EKG  Ke 0.0448 T1/2 15 ~ increasing to 12 hrs and decreasing dose to 750 mg Css 18 mcg/mL Goal trough 15 - 20 mcg/mL  Height: 5\' 9"  (175.3 cm) Weight: 175 lb (79.4 kg) IBW/kg (Calculated) : 70.7  Temp (24hrs), Avg:98.7 F (37.1 C), Min:98 F (36.7 C), Max:99.4 F (37.4 C)  Recent Labs  Lab 09/18/17 2022 09/18/17 2049  WBC 11.0*  --   CREATININE 1.19  --   LATICACIDVEN  --  1.8    Estimated Creatinine Clearance: 48.7 mL/min (by C-G formula based on SCr of 1.19 mg/dL).    Allergies  Allergen Reactions  . Macrolides And Ketolides Other (See Comments)  . Mycinettes   . Nitrofuran Derivatives Other (See Comments)  . Nitrofurantoin Other (See Comments)  . Erythromycin Itching and Rash    Other reaction(s): UNKNOWN  And red skin    Thank you for allowing pharmacy to be a part of this patient's care.  Tobie Lords, PharmD, BCPS Clinical Pharmacist 09/19/2017

## 2017-09-18 NOTE — ED Notes (Addendum)
Pt just had massive coughing fit. Became red in face, felt like it was hard to get his breath. Placed a non-rebreather on pt at 10L d/t oxygen being mid 80's while coughing. Oxygen came up to 94%. Will continue to monitor.   Pt states he wears cpap at night at home.

## 2017-09-18 NOTE — Telephone Encounter (Signed)
Patient wife came by to discuss symptoms and wanted an appt ASAP Scheduled 12/7 with [facility] Anon but would like sooner  STAT if patient feels like he/she is going to faint   1) Are you dizzy now? Not sure, has seemed shaky and dizzy all day   2) Do you feel faint or have you passed out? No  3) Do you have any other symptoms? SOB, shaky, lethargic, a little dehydrated  4) Have you checked your HR and BP (record if available)?

## 2017-09-18 NOTE — ED Notes (Signed)
Called pharmacy to have maxipime sent to ED to give as first antibiotic.

## 2017-09-18 NOTE — ED Notes (Signed)
Date and time results received: 09/18/17 2110   Test: troponin Critical Value: 0.03  Name of Provider Notified: Dr. Clearnce Hasten

## 2017-09-18 NOTE — ED Notes (Addendum)
Pt wife states yesterday pt was having some difficulty breathing. Today pt had EMS come out to house d/t breathing harder. EMS checked pt out and stated everything was ok per family. Wife concerned about pt so brought him to ED. Wife states they worked together all day and pt seemed more weak and having difficulty breathing. Pt denies hx of COPD. Does NOT wear oxygen at home. States he feels more sleepy than normal. Alert, oriented, moving all extremities. Was 84% RA initially in room, placed on 2 L. Came up to 86%. Raised to 4 L nasal cannula, oxygen came up to 95%. Pt talking in complete sentences. Tachypenic.

## 2017-09-19 ENCOUNTER — Other Ambulatory Visit: Payer: Self-pay

## 2017-09-19 LAB — BASIC METABOLIC PANEL
ANION GAP: 9 (ref 5–15)
BUN: 20 mg/dL (ref 6–20)
CHLORIDE: 103 mmol/L (ref 101–111)
CO2: 21 mmol/L — ABNORMAL LOW (ref 22–32)
Calcium: 8.4 mg/dL — ABNORMAL LOW (ref 8.9–10.3)
Creatinine, Ser: 0.99 mg/dL (ref 0.61–1.24)
Glucose, Bld: 157 mg/dL — ABNORMAL HIGH (ref 65–99)
POTASSIUM: 3.8 mmol/L (ref 3.5–5.1)
SODIUM: 133 mmol/L — AB (ref 135–145)

## 2017-09-19 LAB — CBC
HEMATOCRIT: 35.3 % — AB (ref 40.0–52.0)
HEMOGLOBIN: 12.4 g/dL — AB (ref 13.0–18.0)
MCH: 31.7 pg (ref 26.0–34.0)
MCHC: 35.2 g/dL (ref 32.0–36.0)
MCV: 90.2 fL (ref 80.0–100.0)
Platelets: 169 10*3/uL (ref 150–440)
RBC: 3.91 MIL/uL — AB (ref 4.40–5.90)
RDW: 14.2 % (ref 11.5–14.5)
WBC: 9.8 10*3/uL (ref 3.8–10.6)

## 2017-09-19 LAB — PROTIME-INR
INR: 1.91
PROTHROMBIN TIME: 21.7 s — AB (ref 11.4–15.2)

## 2017-09-19 LAB — GLUCOSE, CAPILLARY
GLUCOSE-CAPILLARY: 174 mg/dL — AB (ref 65–99)
GLUCOSE-CAPILLARY: 181 mg/dL — AB (ref 65–99)
GLUCOSE-CAPILLARY: 129 mg/dL — AB (ref 65–99)
Glucose-Capillary: 151 mg/dL — ABNORMAL HIGH (ref 65–99)

## 2017-09-19 LAB — TROPONIN I
TROPONIN I: 0.15 ng/mL — AB (ref <0.03)
TROPONIN I: 0.11 ng/mL — AB (ref <0.03)
Troponin I: 0.1 ng/mL (ref <0.03)

## 2017-09-19 MED ORDER — LEVOFLOXACIN IN D5W 750 MG/150ML IV SOLN
750.0000 mg | INTRAVENOUS | Status: DC
  Administered 2017-09-19: 05:00:00 750 mg via INTRAVENOUS
  Filled 2017-09-19: qty 150

## 2017-09-19 MED ORDER — WARFARIN SODIUM 5 MG PO TABS
5.0000 mg | ORAL_TABLET | ORAL | Status: DC
  Administered 2017-09-19 – 2017-09-20 (×2): 5 mg via ORAL
  Filled 2017-09-19 (×3): qty 1

## 2017-09-19 MED ORDER — WARFARIN - PHARMACIST DOSING INPATIENT
Freq: Every day | Status: DC
  Administered 2017-09-20 (×2)

## 2017-09-19 MED ORDER — WARFARIN SODIUM 2.5 MG PO TABS: 2.5000 mg | ORAL_TABLET | ORAL | Status: DC

## 2017-09-19 MED ORDER — WARFARIN - PHARMACIST DOSING INPATIENT: Freq: Every day | Status: DC

## 2017-09-19 MED ORDER — LEVOFLOXACIN IN D5W 750 MG/150ML IV SOLN
750.0000 mg | INTRAVENOUS | Status: DC
  Administered 2017-09-20 – 2017-09-21 (×2): 750 mg via INTRAVENOUS
  Filled 2017-09-19 (×2): qty 150

## 2017-09-19 NOTE — Progress Notes (Addendum)
ANTICOAGULATION CONSULT NOTE - Initial Consult  Pharmacy Consult for warfarin Indication: atrial fibrillation  Allergies  Allergen Reactions  . Macrolides And Ketolides Other (See Comments)  . Mycinettes   . Nitrofuran Derivatives Other (See Comments)  . Nitrofurantoin Other (See Comments)  . Erythromycin Itching and Rash    Other reaction(s): UNKNOWN  And red skin    Patient Measurements: Height: 5\' 9"  (175.3 cm) Weight: 175 lb (79.4 kg) IBW/kg (Calculated) : 70.7 Heparin Dosing Weight: 79.4 kg  Vital Signs: Temp: 98 F (36.7 C) (12/05 2317) Temp Source: Oral (12/05 2317) BP: 161/80 (12/05 2317) Pulse Rate: 109 (12/05 2317)  Labs: Recent Labs    09/18/17 1002 09/18/17 2022  HGB  --  13.7  HCT  --  40.4  PLT  --  199  LABPROT  --  22.1*  INR 2.3 1.95  CREATININE  --  1.19  TROPONINI  --  0.03*    Estimated Creatinine Clearance: 48.7 mL/min (by C-G formula based on SCr of 1.19 mg/dL).   Medical History: Past Medical History:  Diagnosis Date  . Atherosclerosis of aorta ([place])    by CT scan in past  . Atrial fibrillation ([place])    and aflutter. pt has a left atrial circuit that is not ablated. was on amiodarone-stopped, now use rate control.   . Bladder cancer ([place])    hx; treated with BCG in past   . Carotid artery disease ([place])    There was calcified plaque but no stenosis by carotid artery screening done at [facility] October, 2013  . Diabetes mellitus    type not specified  . Diabetic neuropathy (HCC)    feet  . Elevated liver enzymes    over time; hx  . Excessive sweating   . Glaucoma   . Gout   . Head injury    when slipped on ice 2004-2005. stablaized and back on Coumadin   . Headache    migraines - distant past  . HOCM (hypertrophic obstructive cardiomyopathy) ([place])    a. 01/2016 Echo: EF 65-70%, no rwma, LVOT gradient of 80-69mmHg, mod AS, SAM, sev dil LA, PASP 29mmHg.  [name] Homocystinemia ([place])    elevated, mild   .  Hypercholesterolemia    treated.   . Hypertension   . Kidney mass    laproscopic surgery woth cryoablation of a mass outside kidney followed at [facility].   . Moderate aortic stenosis    a. 01/2016 echo: mod AS.   [name] Motion sickness    ocean boats  . Nausea   . Obstructive sleep apnea    CPAP started successfully 2014  . Orthostatic hypotension    a. orthostatic. dehydration. hospitalized 11/11  . Sleep apnea    Significant obstructive sleep apnea diagnosed in August, 2012, the patient is to receive CPAP   . Stroke [facility])    "2 old strokes" CT and MRI. [place] [facility] 11/11. diagnosis was dehydration, no acutal reports.   . TSH elevation    on synthroid historically   . Unsteady gait    August, 2012    Medications:  Scheduled:  . digoxin  125 mcg Oral Daily  . insulin aspart  0-5 Units Subcutaneous QHS  . insulin aspart  0-9 Units Subcutaneous TID WC  . latanoprost  1 drop Both Eyes QHS  . levothyroxine  50 mcg Oral QAC breakfast  . simvastatin  20 mg Oral QHS  . verapamil  240 mg Oral Daily  . [START ON  09/22/2017] warfarin  2.5 mg Oral Once per day on Sun Wed  . warfarin  5 mg Oral Once per day on Mon Tue Thu Fri Sat    Assessment: Patient admitted for SOB is anticoagulated PTA w/ warfarin for afib. Warfarin regimen: Warfarin 5 mg Mon-Tues-Thurs-Fri-Sat Warfarin 2.5 mg Sun-Wed  12/5 @ 1000 INR 2.3 12/5 @ 2022 INR 1.95 INR currently slightly subtherapeutic from goal -- monitor INR closely as patient is on levaquin 750 mg q48h for CAP  Goal of Therapy:  INR 2-3 Monitor platelets by anticoagulation protocol: Yes   Plan:  Will check another pt/INR w/ am labs. Will start patient back on her warfarin regimen as above. Will monitor daily CBC's and adjust per INR trends.  Tobie Lords, PharmD, BCPS Clinical Pharmacist 09/19/2017

## 2017-09-19 NOTE — Progress Notes (Signed)
[facility] [place] NAME: [name] [name]    MR#:  [id number]  DATE OF BIRTH:  Jun 12, 1934  SUBJECTIVE:  CHIEF COMPLAINT:  Pt is off bipap, on 6 lit o2 , still febrile, family bedside  REVIEW OF SYSTEMS:  CONSTITUTIONAL: No fever, fatigue or weakness.  EYES: No blurred or double vision.  EARS, NOSE, AND THROAT: No tinnitus or ear pain.  RESPIRATORY: improving  cough, shortness of breath, no wheezing or hemoptysis.  CARDIOVASCULAR: No chest pain, orthopnea, edema.  GASTROINTESTINAL: No nausea, vomiting, diarrhea or abdominal pain.  GENITOURINARY: No dysuria, hematuria.  ENDOCRINE: No polyuria, nocturia,  HEMATOLOGY: No anemia, easy bruising or bleeding SKIN: No rash or lesion. MUSCULOSKELETAL: No joint pain or arthritis.   NEUROLOGIC: No tingling, numbness, weakness.  PSYCHIATRY: No anxiety or depression.   DRUG ALLERGIES:   Allergies  Allergen Reactions  . Macrolides And Ketolides Other (See Comments)  . Mycinettes   . Nitrofuran Derivatives Other (See Comments)  . Nitrofurantoin Other (See Comments)  . Erythromycin Itching and Rash    Other reaction(s): UNKNOWN  And red skin    VITALS:  Blood pressure 119/60, pulse 77, temperature (!) 101.8 F (38.8 C), temperature source Oral, resp. rate 20, height 5\' 9"  (1.753 m), weight 79.4 kg (175 lb), SpO2 100 %.  PHYSICAL EXAMINATION:  GENERAL:  83 y.o.-year-old patient lying in the bed with no acute distress.  EYES: Pupils equal, round, reactive to light and accommodation. No scleral icterus. Extraocular muscles intact.  HEENT: Head atraumatic, normocephalic. Oropharynx and nasopharynx clear.  NECK:  Supple, no jugular venous distention. No thyroid enlargement, no tenderness.  LUNGS: Moderate breath sounds bilaterally, no wheezing, rales,rhonchi or crepitation. No use of accessory muscles of respiration.  CARDIOVASCULAR: S1, S2 normal. No murmurs, rubs, or gallops.  ABDOMEN:  Soft, nontender, nondistended. Bowel sounds present. No organomegaly or mass.  EXTREMITIES: No pedal edema, cyanosis, or clubbing.  NEUROLOGIC: Cranial nerves II through XII are intact. Muscle strength 5/5 in all extremities. Sensation intact. Gait not checked.  PSYCHIATRIC: The patient is alert and oriented x 3.  SKIN: No obvious rash, lesion, or ulcer.    LABORATORY PANEL:   CBC Recent Labs  Lab 09/19/17 0510  WBC 9.8  HGB 12.4*  HCT 35.3*  PLT 169   ------------------------------------------------------------------------------------------------------------------  Chemistries  Recent Labs  Lab 09/19/17 0510  NA 133*  K 3.8  CL 103  CO2 21*  GLUCOSE 157*  BUN 20  CREATININE 0.99  CALCIUM 8.4*   ------------------------------------------------------------------------------------------------------------------  Cardiac Enzymes Recent Labs  Lab 09/19/17 1105  TROPONINI 0.11*   ------------------------------------------------------------------------------------------------------------------  RADIOLOGY:  Dg Chest Portable 1 View  Result Date: 09/18/2017 CLINICAL DATA:  Shortness of breath.  History of atrial fibrillation EXAM: PORTABLE CHEST 1 VIEW COMPARISON:  April 08, 2017 FINDINGS: There is fibrotic change in the lung bases. There is no frank edema or consolidation. Heart is upper normal in size with pulmonary vascularity within normal limits. No adenopathy. No evident bone lesions. There is aortic atherosclerosis. IMPRESSION: Fibrotic change in the bases. No edema or consolidation. Heart upper normal in size. There is aortic atherosclerosis. Aortic Atherosclerosis (ICD10-I70.0). Electronically Signed   By: Lowella Grip III M.D.   On: 09/18/2017 20:38    EKG:   Orders placed or performed during the hospital encounter of 09/18/17  . ED EKG  . ED EKG  . EKG 12-Lead  . EKG 12-Lead  . ED EKG 12-Lead  . ED  EKG 12-Lead    ASSESSMENT AND PLAN:      # CAP  (community acquired pneumonia) - Clinically better. Off BiPAP  Still febrile though Incentive spirometry Mean of oxygen as tolerated Continue IV antibiotic levofloxacin Hemodynamically stable  lactic acid normal, blood pressure stable Blood cultures negative so far      #Atrial fibrillation and flutter ([place]) -continue digoxin. Check digoxin levels On Coumadin. PT/INR follow-up  INR 1.91 today    # Arteriosclerosis of coronary artery -troponin mildly elevated, suspect this is due to demand from his tachycardia from his A. Fib  Troponin 0.10-0.15-0.11 Patient is asymptomatic today    #  Diabetes mellitus, type 2 ([place]) -sliding scale insulin with corresponding glucose checks   # Essential (primary) hypertension -continue home meds  #  Obstructive sleep apnea -BiPAP nightly    # hypothyroidism -continue Synthroid   PT consult    All the records are reviewed and case discussed with Care Management/Social Workerr. Management plans discussed with the patient, family and they are in agreement.  CODE STATUS:   TOTAL TIME TAKING CARE OF THIS PATIENT: 35  minutes.   POSSIBLE D/C IN 1-2 DAYS, DEPENDING ON CLINICAL CONDITION.  Note: This dictation was prepared with Dragon dictation along with smaller phrase technology. Any transcriptional errors that result from this process are unintentional.   Nicholes Mango M.D on 09/19/2017 at 2:33 PM  Between 7am to 6pm - Pager - [phone] After 6pm go to [email or web address] - password [email or web address] [facility]  [place] [facility]  Office  [phone]  CC: Primary care physician; [name], MD

## 2017-09-19 NOTE — Progress Notes (Signed)
Inpatient Diabetes Program Recommendations  AACE/ADA: New Consensus Statement on Inpatient Glycemic Control (2015)  Target Ranges:  Prepandial:   less than 140 mg/dL      Peak postprandial:   less than 180 mg/dL (1-2 hours)      Critically ill patients:  140 - 180 mg/dL   Lab Results  Component Value Date   GLUCAP 174 (H) 09/19/2017   HGBA1C 6.5 08/05/2017    Review of Glycemic Control  Results for [name], [name] (MRN [id number]) as of 09/19/2017 08:53  Ref. Range 09/18/2017 23:14 09/19/2017 07:56  Glucose-Capillary Latest Ref Range: 65 - 99 mg/dL 239 (H) 174 (H)    Diabetes history: Type 2 Outpatient Diabetes medications: Glucophage 500mg  qday  Current orders for Inpatient glycemic control: Novolog 0-9 units tid, Novolog 0-5 units qhs  Inpatient Diabetes Program Recommendations: Consider adding low dose basal insulin- consider Lantus 8 units qhs (0.1 unit/kg)- fasting blood sugar 174mg /dl  Gentry Fitz, RN, BA, [place], CDE Diabetes Coordinator Inpatient Diabetes Program  [phone] (Team Pager) [phone] ([place]) 09/19/2017 8:55 AM

## 2017-09-19 NOTE — Progress Notes (Signed)
Noticed cough post medication administration; questioned patient and wife; patient stated that he occasionally coughs with meds; wife states that he often coughs, and with food; might benefit from speech/swallow evaluation; Barbaraann Faster, RN 8:50 PM 09/19/2017

## 2017-09-19 NOTE — Progress Notes (Signed)
Pharmacy Antibiotic Note  [name] [name] is a 83 y.o. male admitted on 09/18/2017 with pneumonia.  Pharmacy has been consulted for vanc/cefepime dosing.  Currently ordered Vancomycin 750mg  IV q12h, Cefepime 2g IV q24h, and Levaquin 750mg  IV q48h.  Plan: Patient with pneumonia and no risk factors for HCAP after discussion with Dr. Margaretmary Eddy will discontinue Vancomycin and Cefepime. Will continue with Levaquin but will increase to q24h dosing as renal function has improved.  Height: 5\' 9"  (175.3 cm) Weight: 175 lb (79.4 kg) IBW/kg (Calculated) : 70.7  Temp (24hrs), Avg:99.9 F (37.7 C), Min:98 F (36.7 C), Max:101.8 F (38.8 C)  Recent Labs  Lab 09/18/17 2022 09/18/17 2049 09/19/17 0510  WBC 11.0*  --  9.8  CREATININE 1.19  --  0.99  LATICACIDVEN  --  1.8  --     Estimated Creatinine Clearance: 58.5 mL/min (by C-G formula based on SCr of 0.99 mg/dL).    Allergies  Allergen Reactions  . Macrolides And Ketolides Other (See Comments)  . Mycinettes   . Nitrofuran Derivatives Other (See Comments)  . Nitrofurantoin Other (See Comments)  . Erythromycin Itching and Rash    Other reaction(s): UNKNOWN  And red skin    Thank you for allowing pharmacy to be a part of this patient's care.  Paulina Fusi, PharmD, BCPS 09/19/2017 2:11 PM

## 2017-09-19 NOTE — Telephone Encounter (Signed)
Pt admitted w/diagnosis of pneumonia. Will cancel tomorrow's appt.

## 2017-09-19 NOTE — Progress Notes (Signed)
Pt taken off bipap and placed on 6lpm [place], sats 95%, tolerating well at this time. Will continue to monitor

## 2017-09-20 ENCOUNTER — Inpatient Hospital Stay: Payer: PPO

## 2017-09-20 ENCOUNTER — Ambulatory Visit: Payer: PPO | Admitting: Cardiovascular Disease

## 2017-09-20 ENCOUNTER — Telehealth: Payer: Self-pay | Admitting: Family Medicine

## 2017-09-20 DIAGNOSIS — J189 Pneumonia, unspecified organism: Principal | ICD-10-CM

## 2017-09-20 LAB — BASIC METABOLIC PANEL
ANION GAP: 9 (ref 5–15)
BUN: 20 mg/dL (ref 6–20)
CALCIUM: 8.4 mg/dL — AB (ref 8.9–10.3)
CO2: 22 mmol/L (ref 22–32)
Chloride: 100 mmol/L — ABNORMAL LOW (ref 101–111)
Creatinine, Ser: 0.95 mg/dL (ref 0.61–1.24)
GFR calc non Af Amer: >60 mL/min (ref >60)
Glucose, Bld: 134 mg/dL — ABNORMAL HIGH (ref 65–99)
POTASSIUM: 4.1 mmol/L (ref 3.5–5.1)
SODIUM: 131 mmol/L — AB (ref 135–145)

## 2017-09-20 LAB — GLUCOSE, CAPILLARY
GLUCOSE-CAPILLARY: 135 mg/dL — AB (ref 65–99)
GLUCOSE-CAPILLARY: 121 mg/dL — AB (ref 65–99)
GLUCOSE-CAPILLARY: 114 mg/dL — AB (ref 65–99)
Glucose-Capillary: 180 mg/dL — ABNORMAL HIGH (ref 65–99)

## 2017-09-20 LAB — CBC
HCT: 36.5 % — ABNORMAL LOW (ref 40.0–52.0)
Hemoglobin: 12.7 g/dL — ABNORMAL LOW (ref 13.0–18.0)
MCH: 31.5 pg (ref 26.0–34.0)
MCHC: 34.9 g/dL (ref 32.0–36.0)
MCV: 90.3 fL (ref 80.0–100.0)
Platelets: 156 10*3/uL (ref 150–440)
RBC: 4.04 MIL/uL — AB (ref 4.40–5.90)
RDW: 14.1 % (ref 11.5–14.5)
WBC: 8.4 10*3/uL (ref 3.8–10.6)

## 2017-09-20 LAB — PROTIME-INR
INR: 1.87
PROTHROMBIN TIME: 21.4 s — AB (ref 11.4–15.2)

## 2017-09-20 MED ORDER — WARFARIN SODIUM 1 MG PO TABS
1.0000 mg | ORAL_TABLET | Freq: Once | ORAL | Status: AC
  Administered 2017-09-20: 18:00:00 1 mg via ORAL
  Filled 2017-09-20: qty 1

## 2017-09-20 MED ORDER — SODIUM CHLORIDE 0.9 % IV SOLN
INTRAVENOUS | Status: AC
  Administered 2017-09-20: 15:00:00 via INTRAVENOUS

## 2017-09-20 NOTE — Progress Notes (Signed)
*   [facility]  CT chest high-resolution ordered earlier was reviewed:  Impression: There is slight groundglass changes in mid zones as well as bibasilar pleural effusions, likely consistent with the patient's known history of hypertrophic obstructive cardiomyopathy and atrial fibrillation.  There are scattered interstitial changes, which appears mild, no significant interstitial lung disease is found. There do appear to be some enlarged mediastinal lymph nodes, though this is difficult to tell without the benefit of contrast.  Possible emphysematous changes.  - Can continue with antibiotics for a short course. -Wean down oxygen. -Outpatient follow-up for mediastinal lymphadenopathy and COPD.   Marda Stalker, MD.  [facility] Office Number: [phone]  Patricia Pesa, M.D.  Merton Border, M.D  09/20/2017

## 2017-09-20 NOTE — Evaluation (Signed)
Physical Therapy Evaluation Patient Details Name: [name] [name] MRN: [id number] DOB: [birth year] Today's Date: 09/20/2017   History of Present Illness  83 y.o. male who presents with several days of increasing cough, with acute onset shortness of breath.  Chest x-ray shows pneumonia.  Pt hypoxic on arrival, has required O2 since (down to 3 liters at time of eval from 6)  Clinical Impression  Pt is very confident with his ability to move/ambulate.  He did not have any overt LOBs, but did have some general unsteadiness that apparently is not his baseline.  Attempted to do some light activity in bed on room air but sats dropped relatively quickly from mid/upper 90s to ~90 and PT reapplied 3 liters O2.  Pt should be safe to go home once medically stable, but is not at his baseline and will benefit from HHPT.    Follow Up Recommendations Home health PT    Equipment Recommendations  None recommended by PT    Recommendations for Other Services       Precautions / Restrictions Precautions Precautions: Fall Restrictions Weight Bearing Restrictions: No      Mobility  Bed Mobility Overal bed mobility: Independent             General bed mobility comments: Pt able to get to EOB w/o hesitation  Transfers Overall transfer level: Independent Equipment used: None             General transfer comment: Safe and confident with sit to stand transition  Ambulation/Gait Ambulation/Gait assistance: Supervision Ambulation Distance (Feet): 200 Feet Assistive device: None       General Gait Details: Pt was able to walk with consistent cadence, though he did have occasional unsteadiness (including one stagger step) that did not require PT assist to arrest.  Pt on 3 liters O2 during ambulation (sats stayed in the mid 90s t/o)  Stairs            Wheelchair Mobility    Modified Rankin (Stroke Patients Only)       Balance Overall balance assessment: Modified Independent                                            Pertinent Vitals/Pain Pain Assessment: No/denies pain    Home Living Family/patient expects to be discharged to:: Private residence Living Arrangements: Spouse/significant other Available Help at Discharge: Family           Home Equipment: Kasandra Knudsen - single point      Prior Function Level of Independence: Independent         Comments: Pt able to be very active, no limitations     Hand Dominance        Extremity/Trunk Assessment   Upper Extremity Assessment Upper Extremity Assessment: Overall WFL for tasks assessed    Lower Extremity Assessment Lower Extremity Assessment: Overall WFL for tasks assessed       Communication   Communication: No difficulties  Cognition Arousal/Alertness: Awake/alert Behavior During Therapy: WFL for tasks assessed/performed Overall Cognitive Status: Within Functional Limits for tasks assessed                                        General Comments      Exercises     Assessment/Plan  PT Assessment Patient needs continued PT services  PT Problem List Decreased balance;Decreased coordination;Decreased activity tolerance;Decreased safety awareness;Cardiopulmonary status limiting activity       PT Treatment Interventions Gait training;Functional mobility training;Therapeutic activities;Therapeutic exercise;Balance training;Neuromuscular re-education;Patient/family education    PT Goals (Current goals can be found in the Care Plan section)  Acute Rehab PT Goals Patient Stated Goal: go home PT Goal Formulation: With patient Time For Goal Achievement: 10/04/17 Potential to Achieve Goals: Good    Frequency Min 2X/week   Barriers to discharge        Co-evaluation               AM-PAC PT "6 Clicks" Daily Activity  Outcome Measure Difficulty turning over in bed (including adjusting bedclothes, sheets and blankets)?: None Difficulty moving  from lying on back to sitting on the side of the bed? : None Difficulty sitting down on and standing up from a chair with arms (e.g., wheelchair, bedside commode, etc,.)?: None Help needed moving to and from a bed to chair (including a wheelchair)?: None Help needed walking in hospital room?: None Help needed climbing 3-5 steps with a railing? : A Little 6 Click Score: 23    End of Session Equipment Utilized During Treatment: Gait belt;Oxygen(3 liters, down to 2 liters post ambulation (sats stable)) Activity Tolerance: Patient tolerated treatment well Patient left: with chair alarm set;with call bell/phone within reach   PT Visit Diagnosis: Muscle weakness (generalized) (M62.81);Unsteadiness on feet (R26.81)    Time: 9983-3825 PT Time Calculation (min) (ACUTE ONLY): 26 min   Charges:   PT Evaluation $PT Eval Low Complexity: 1 Low     PT G Codes:   PT G-Codes **NOT FOR INPATIENT CLASS** Functional Assessment Tool Used: AM-PAC 6 Clicks Basic Mobility Functional Limitation: Mobility: Walking and moving around Mobility: Walking and Moving Around Current Status (K5397): At least 1 percent but less than 20 percent impaired, limited or restricted Mobility: Walking and Moving Around Goal Status [phone]): 0 percent impaired, limited or restricted    Kreg Shropshire, DPT 09/20/2017, 1:19 PM

## 2017-09-20 NOTE — Progress Notes (Signed)
Pharmacy Antibiotic Note  [name] [name] is a 83 y.o. male admitted on 09/18/2017 with pneumonia.  Pharmacy has been consulted for Levaquin.   Plan: 12/6: Patient with pneumonia and no risk factors for HCAP after discussion with Dr. Margaretmary Eddy will discontinue Vancomycin and Cefepime. Will continue with Levaquin but will increase to q24h dosing as renal function has improved.  12/7 Continue Levaquin 750 mg Q24h   Height: 5\' 9"  (175.3 cm) Weight: 175 lb (79.4 kg) IBW/kg (Calculated) : 70.7  Temp (24hrs), Avg:99.6 F (37.6 C), Min:98.3 F (36.8 C), Max:101.8 F (38.8 C)  Recent Labs  Lab 09/18/17 2022 09/18/17 2049 09/19/17 0510 09/20/17 0720  WBC 11.0*  --  9.8 8.4  CREATININE 1.19  --  0.99 0.95  LATICACIDVEN  --  1.8  --   --     Estimated Creatinine Clearance: 61 mL/min (by C-G formula based on SCr of 0.95 mg/dL).    Allergies  Allergen Reactions  . Macrolides And Ketolides Other (See Comments)  . Mycinettes   . Nitrofuran Derivatives Other (See Comments)  . Nitrofurantoin Other (See Comments)  . Erythromycin Itching and Rash    Other reaction(s): UNKNOWN  And red skin    Thank you for allowing pharmacy to be a part of this patient's care.  Chinita Greenland PharmD Clinical Pharmacist 09/20/2017

## 2017-09-20 NOTE — Care Management Important Message (Signed)
Important Message  Patient Details  Name: ADONIJAH BAENA MRN: [id number] Date of Birth: 1934-09-10   Medicare Important Message Given:  Yes  Signed IM notice given   Katrina Stack, RN 09/20/2017, 11:57 AM

## 2017-09-20 NOTE — Progress Notes (Signed)
[place] [facility] [place] NAME: [name] [name]    MR#:  [id number]  DATE OF BIRTH:  09/19/1934  SUBJECTIVE:  CHIEF COMPLAINT:  Pt is off bipap, on 3 lit o2 , still febrile, out of bed to chair family bedside  REVIEW OF SYSTEMS:  CONSTITUTIONAL: No fever, fatigue or weakness.  EYES: No blurred or double vision.  EARS, NOSE, AND THROAT: No tinnitus or ear pain.  RESPIRATORY: improving  cough, shortness of breath, no wheezing or hemoptysis.  CARDIOVASCULAR: No chest pain, orthopnea, edema.  GASTROINTESTINAL: No nausea, vomiting, diarrhea or abdominal pain.  GENITOURINARY: No dysuria, hematuria.  ENDOCRINE: No polyuria, nocturia,  HEMATOLOGY: No anemia, easy bruising or bleeding SKIN: No rash or lesion. MUSCULOSKELETAL: No joint pain or arthritis.   NEUROLOGIC: No tingling, numbness, weakness.  PSYCHIATRY: No anxiety or depression.   DRUG ALLERGIES:   Allergies  Allergen Reactions  . Macrolides And Ketolides Other (See Comments)  . Mycinettes   . Nitrofuran Derivatives Other (See Comments)  . Nitrofurantoin Other (See Comments)  . Erythromycin Itching and Rash    Other reaction(s): UNKNOWN  And red skin    VITALS:  Blood pressure (!) 93/45, pulse 85, temperature 99.2 F (37.3 C), resp. rate 18, height 5\' 9"  (1.753 m), weight 79.4 kg (175 lb), SpO2 96 %.  PHYSICAL EXAMINATION:  GENERAL:  83 y.o.-year-old patient lying in the bed with no acute distress.  EYES: Pupils equal, round, reactive to light and accommodation. No scleral icterus. Extraocular muscles intact.  HEENT: Head atraumatic, normocephalic. Oropharynx and nasopharynx clear.  NECK:  Supple, no jugular venous distention. No thyroid enlargement, no tenderness.  LUNGS: Moderate breath sounds bilaterally, no wheezing, rales,rhonchi or crepitation. No use of accessory muscles of respiration.  CARDIOVASCULAR: S1, S2 normal. No murmurs, rubs, or gallops.  ABDOMEN: Soft,  nontender, nondistended. Bowel sounds present. No organomegaly or mass.  EXTREMITIES: No pedal edema, cyanosis, or clubbing.  NEUROLOGIC: Cranial nerves II through XII are intact. Muscle strength 5/5 in all extremities. Sensation intact. Gait not checked.  PSYCHIATRIC: The patient is alert and oriented x 3.  SKIN: No obvious rash, lesion, or ulcer.    LABORATORY PANEL:   CBC Recent Labs  Lab 09/20/17 0720  WBC 8.4  HGB 12.7*  HCT 36.5*  PLT 156   ------------------------------------------------------------------------------------------------------------------  Chemistries  Recent Labs  Lab 09/20/17 0720  NA 131*  K 4.1  CL 100*  CO2 22  GLUCOSE 134*  BUN 20  CREATININE 0.95  CALCIUM 8.4*   ------------------------------------------------------------------------------------------------------------------  Cardiac Enzymes Recent Labs  Lab 09/19/17 1105  TROPONINI 0.11*   ------------------------------------------------------------------------------------------------------------------  RADIOLOGY:  Dg Chest Portable 1 View  Result Date: 09/18/2017 CLINICAL DATA:  Shortness of breath.  History of atrial fibrillation EXAM: PORTABLE CHEST 1 VIEW COMPARISON:  April 08, 2017 FINDINGS: There is fibrotic change in the lung bases. There is no frank edema or consolidation. Heart is upper normal in size with pulmonary vascularity within normal limits. No adenopathy. No evident bone lesions. There is aortic atherosclerosis. IMPRESSION: Fibrotic change in the bases. No edema or consolidation. Heart upper normal in size. There is aortic atherosclerosis. Aortic Atherosclerosis (ICD10-I70.0). Electronically Signed   By: Lowella Grip III M.D.   On: 09/18/2017 20:38    EKG:   Orders placed or performed during the hospital encounter of 09/18/17  . ED EKG  . ED EKG  . EKG 12-Lead  . EKG 12-Lead  . ED EKG 12-Lead  .  ED EKG 12-Lead    ASSESSMENT AND PLAN:      # CAP  (community acquired pneumonia) - Clinically better. Off BiPAP  Still febrile though Incentive spirometry wean of oxygen as tolerated Continue IV antibiotic levofloxacin Hemodynamically stable  lactic acid normal, blood pressure stable Blood cultures negative so far  #Hypertrophic obstructive cardiomyopathy Outpatient follow-up with cardiology  #Interstitial lung disease with mediastinal lymph nodes outpatient follow-up with pulmonology Appreciate pulmonology recommendations     #Atrial fibrillation and flutter ([place]) -continue digoxin. Check digoxin levels On Coumadin. PT/INR follow-up  INR 1.91 today    # Arteriosclerosis of coronary artery -troponin mildly elevated, suspect this is due to demand from his tachycardia from his A. Fib  Troponin 0.10-0.15-0.11 Patient is asymptomatic today  #  Diabetes mellitus, type 2 ([place]) -sliding scale insulin with corresponding glucose checks   # Essential (primary) hypertension -continue home meds  #  Obstructive sleep apnea -BiPAP nightly    # hypothyroidism -continue Synthroid   PT consult-home health PT     All the records are reviewed and case discussed with Care Management/Social Workerr. Management plans discussed with the patient, family and they are in agreement.  CODE STATUS:   TOTAL TIME TAKING CARE OF THIS PATIENT: 35  minutes.   POSSIBLE D/C IN 1-2 DAYS, DEPENDING ON CLINICAL CONDITION.  Note: This dictation was prepared with Dragon dictation along with smaller phrase technology. Any transcriptional errors that result from this process are unintentional.   Nicholes Mango M.D on 09/20/2017 at 4:42 PM  Between 7am to 6pm - Pager - [phone] After 6pm go to [email or web address] - password [email or web address] [facility]  [place] [facility]  Office  [phone]  CC: Primary care physician; [name], MD

## 2017-09-20 NOTE — Care Management (Signed)
Patient currently on 3-4 liters of oxygen. Physical therapy is pending.

## 2017-09-20 NOTE — Progress Notes (Signed)
ANTICOAGULATION CONSULT NOTE follow up  Pharmacy Consult for warfarin Indication: atrial fibrillation  Allergies  Allergen Reactions  . Macrolides And Ketolides Other (See Comments)  . Mycinettes   . Nitrofuran Derivatives Other (See Comments)  . Nitrofurantoin Other (See Comments)  . Erythromycin Itching and Rash    Other reaction(s): UNKNOWN  And red skin    Patient Measurements: Height: 5\' 9"  (175.3 cm) Weight: 175 lb (79.4 kg) IBW/kg (Calculated) : 70.7 Heparin Dosing Weight: 79.4 kg  Vital Signs: Temp: 98.4 F (36.9 C) (12/07 0434) Temp Source: Oral (12/07 0434) BP: 108/58 (12/07 0840) Pulse Rate: 90 (12/07 0840)  Labs: Recent Labs    09/18/17 2022 09/19/17 0237 09/19/17 0510 09/19/17 1105 09/20/17 0720  HGB 13.7  --  12.4*  --  12.7*  HCT 40.4  --  35.3*  --  36.5*  PLT 199  --  169  --  156  LABPROT 22.1*  --  21.7*  --  21.4*  INR 1.95  --  1.91  --  1.87  CREATININE 1.19  --  0.99  --  0.95  TROPONINI 0.03* 0.10* 0.15* 0.11*  --     Estimated Creatinine Clearance: 61 mL/min (by C-G formula based on SCr of 0.95 mg/dL).   Medical History: Past Medical History:  Diagnosis Date  . Atherosclerosis of aorta ([place])    by CT scan in past  . Atrial fibrillation ([place])    and aflutter. pt has a left atrial circuit that is not ablated. was on amiodarone-stopped, now use rate control.   . Bladder cancer ([place])    hx; treated with BCG in past   . Carotid artery disease ([place])    There was calcified plaque but no stenosis by carotid artery screening done at [facility] October, 2013  . Diabetes mellitus    type not specified  . Diabetic neuropathy (HCC)    feet  . Elevated liver enzymes    over time; hx  . Excessive sweating   . Glaucoma   . Gout   . Head injury    when slipped on ice 2004-2005. stablaized and back on Coumadin   . Headache    migraines - distant past  . HOCM (hypertrophic obstructive cardiomyopathy) ([place])    a. 01/2016  Echo: EF 65-70%, no rwma, LVOT gradient of 80-37mmHg, mod AS, SAM, sev dil LA, PASP 16mmHg.  [name] Homocystinemia ([place])    elevated, mild   . Hypercholesterolemia    treated.   . Hypertension   . Kidney mass    laproscopic surgery woth cryoablation of a mass outside kidney followed at [facility].   . Moderate aortic stenosis    a. 01/2016 echo: mod AS.   [name] Motion sickness    ocean boats  . Nausea   . Obstructive sleep apnea    CPAP started successfully 2014  . Orthostatic hypotension    a. orthostatic. dehydration. hospitalized 11/11  . Sleep apnea    Significant obstructive sleep apnea diagnosed in August, 2012, the patient is to receive CPAP   . Stroke [facility])    "2 old strokes" CT and MRI. [place] hospital 11/11. diagnosis was dehydration, no acutal reports.   . TSH elevation    on synthroid historically   . Unsteady gait    August, 2012    Medications:  Scheduled:  . digoxin  125 mcg Oral Daily  . insulin aspart  0-5 Units Subcutaneous QHS  . insulin aspart  0-9 Units Subcutaneous  TID WC  . latanoprost  1 drop Both Eyes QHS  . levothyroxine  50 mcg Oral QAC breakfast  . simvastatin  20 mg Oral QHS  . verapamil  240 mg Oral Daily  . [START ON 09/22/2017] warfarin  2.5 mg Oral Once per day on Sun Wed  . warfarin  5 mg Oral Once per day on Mon Tue Thu Fri Sat  . Warfarin - Pharmacist Dosing Inpatient   Does not apply q1800    Assessment: Patient admitted for SOB is anticoagulated PTA w/ warfarin for afib. Warfarin regimen: Warfarin 5 mg Mon-Tues-Thurs-Fri-Sat Warfarin 2.5 mg Sun-Wed  12/5 @ 1000 INR 2.3 12/5 @ 2022 INR 1.95  12/6  INR  1.91  Warfarin 5mg  12/7  INR  1.87   Goal of Therapy:  INR 2-3 Monitor platelets by anticoagulation protocol: Yes   Plan:  Will increase Warfarin to 6 mg for tonight only (5 mg and 1 mg tablet ordered). Then continue home regimen. Patient is on levaquin 750 mg for CAP Will monitor INR daily and CBC's per protocol and adjust per INR  trends.   Chinita Greenland PharmD Clinical Pharmacist 09/20/2017

## 2017-09-20 NOTE — Consult Note (Signed)
   [facility] CM Inpatient Consult   09/20/2017  [name] [name] 1934-02-24 [id number]   Patient screened for [place] Management program services. Patient eligible, however transition of care will be conducted by Primary Care Provider and referral will be sent from MD office if needs persist. Spoke with patient and family. [employer], water quality given. Needs identified this admit are  Hx of afib, stroke, and pneumonia. Notification sent to Provider office contact personnel [facility] LPN.  Made inpatient RNCM aware of the above. Please contact for further questions:  Gustie Bobb RN, [facility] Liaison  [phone]) [place] [phone]) Toll free office

## 2017-09-20 NOTE — Consult Note (Signed)
[facility] Consultation   ASSESSMENT/PLAN   Dyspnea.  Chest x-ray does show some interstitial prominence versus vascular prominence.  Does have a history of hypertrophic obstructive cardiomyopathy, atrial fibrillation.  Will obtain noncontrast high resolution CAT scan to get a further view of the parenchymal lung markings.  Would recommend treating as infectious, agree with Levaquin, supplemental oxygen as needed, short acting bronchodilators as needed.  Patient at this time looks comfortable.  Name: [name] [name] MRN: [id number] DOB: [birth year]    ADMISSION DATE:  09/18/2017 CONSULTATION DATE:  09/21/2107  REFERRING MD :  Dr. Margaretmary Eddy  CHIEF COMPLAINT: Shortness of breath   HISTORY OF PRESENT ILLNESS: [name] [name] is a very pleasant 83 year old Caucasian gentleman with a past medical history remarkable for hypertrophic obstructive cardiomyopathy, chronic atrial fibrillation, bladder cancer, treated with BCG in the past, carotid artery disease, diabetes, neuropathy, hypertension, hyperlipidemia, hypothyroidism, CVA, obstructive sleep apnea, presented with increasing shortness of breath, cough, yellow productive sputum, fever, shaking chills, was admitted to the hospital for probable pneumonia.  On chest x-ray however is also noted to have prominent interstitial markings, consulted to evaluate for possible interstitial lung disease.  PAST MEDICAL HISTORY :  Past Medical History:  Diagnosis Date  . Atherosclerosis of aorta ([place])    by CT scan in past  . Atrial fibrillation ([place])    and aflutter. pt has a left atrial circuit that is not ablated. was on amiodarone-stopped, now use rate control.   . Bladder cancer ([place])    hx; treated with BCG in past   . Carotid artery disease ([place])    There was calcified plaque but no stenosis by carotid artery screening done at [facility] October, 2013  . Diabetes mellitus    type not specified  . Diabetic  neuropathy (HCC)    feet  . Elevated liver enzymes    over time; hx  . Excessive sweating   . Glaucoma   . Gout   . Head injury    when slipped on ice 2004-2005. stablaized and back on Coumadin   . Headache    migraines - distant past  . HOCM (hypertrophic obstructive cardiomyopathy) ([place])    a. 01/2016 Echo: EF 65-70%, no rwma, LVOT gradient of 80-61mmHg, mod AS, SAM, sev dil LA, PASP 58mmHg.  [name] Homocystinemia ([place])    elevated, mild   . Hypercholesterolemia    treated.   . Hypertension   . Kidney mass    laproscopic surgery woth cryoablation of a mass outside kidney followed at [facility].   . Moderate aortic stenosis    a. 01/2016 echo: mod AS.   [name] Motion sickness    ocean boats  . Nausea   . Obstructive sleep apnea    CPAP started successfully 2014  . Orthostatic hypotension    a. orthostatic. dehydration. hospitalized 11/11  . Sleep apnea    Significant obstructive sleep apnea diagnosed in August, 2012, the patient is to receive CPAP   . Stroke [facility])    "2 old strokes" CT and MRI. [place] hospital 11/11. diagnosis was dehydration, no acutal reports.   . TSH elevation    on synthroid historically   . Unsteady gait    August, 2012   Past Surgical History:  Procedure Laterality Date  . BLADDER SURGERY    . CATARACT EXTRACTION W/PHACO Left 05/11/2015   Procedure: CATARACT EXTRACTION PHACO AND INTRAOCULAR LENS PLACEMENT (IOC);  Surgeon: Leandrew Koyanagi, MD;  Location: [facility];  Service:  Ophthalmology;  Laterality: Left;  DIABETIC - oral meds, CPAP  . KIDNEY SURGERY     "froze mass"  . TONSILLECTOMY     Prior to Admission medications   Medication Sig Start Date End Date Taking? Authorizing Provider  Calcium-Vitamin D 600-200 MG-UNIT per tablet Take 1 tablet by mouth daily.     Yes [provider]  co-enzyme Q-10 30 MG capsule Take 30 mg by mouth daily.     Yes [provider]  digoxin (LANOXIN) 0.125 MG tablet TAKE ONE TABLET BY MOUTH ONCE  DAILY 01/02/17  Yes Wellington Hampshire, MD  fish oil-omega-3 fatty acids 1000 MG capsule Take 2 g by mouth 2 (two) times daily.     Yes [provider]  latanoprost (XALATAN) 0.005 % ophthalmic solution Place 1 drop into both eyes at bedtime.  11/13/13  Yes [provider]  levothyroxine (SYNTHROID, LEVOTHROID) 50 MCG tablet TAKE ONE TABLET BY MOUTH ONCE DAILY 06/05/17  Yes [name], MD  metFORMIN (GLUCOPHAGE) 500 MG tablet TAKE ONE TABLET BY MOUTH ONCE DAILY 12/27/16  Yes [name], MD  POTASSIUM PO Take 1 tablet by mouth daily.     Yes [provider]  simvastatin (ZOCOR) 40 MG tablet Take 0.5 tablets (20 mg total) by mouth at bedtime. 04/29/17  Yes Wellington Hampshire, MD  verapamil (CALAN-SR) 240 MG CR tablet Take 1 tablet (240 mg total) by mouth daily. 09/14/16  Yes Wellington Hampshire, MD  vitamin B-12 (CYANOCOBALAMIN) 500 MCG tablet Take 500 mcg by mouth daily.     Yes [provider]  vitamin C (ASCORBIC ACID) 500 MG tablet Take 500 mg by mouth daily.     Yes [provider]  warfarin (COUMADIN) 2.5 MG tablet Take 2.5 mg by mouth as directed. On Sundays and Wednesdays   Yes [provider]  warfarin (COUMADIN) 5 MG tablet TAKE AS DIRECTED BY  COUMADIN  CLINIC Patient taking differently: Take 5 mg by mouth daily. TAKE AS DIRECTED BY  COUMADIN  CLINIC 08/05/17  Yes Wellington Hampshire, MD  Gelatin Capsules, Empty, CAPS 2 capsules by Does not apply route 2 (two) times daily.     [provider]  Glucosamine 500 MG CAPS Take 1 capsule by mouth daily.      [provider]  glucose blood (ONE TOUCH ULTRA TEST) test strip USE ONE STRIP TO CHECK GLUCOSE ONCE DAILY 03/25/17   [name], MD  glucose blood test strip  12/08/14   [provider]  [facility] DELICA LANCETS 94W MISC USE ONE LANCET TO CHECK BLOOD GLUCOSE ONCE DAILY 03/25/17   [name], MD  verapamil (CALAN-SR) 240 MG CR  tablet TAKE ONE TABLET BY MOUTH ONCE DAILY Patient not taking: Reported on 09/18/2017 09/03/17   Wellington Hampshire, MD   Allergies  Allergen Reactions  . Macrolides And Ketolides Other (See Comments)  . Mycinettes   . Nitrofuran Derivatives Other (See Comments)  . Nitrofurantoin Other (See Comments)  . Erythromycin Itching and Rash    Other reaction(s): UNKNOWN  And red skin    FAMILY HISTORY:  Family History  Problem Relation Age of Onset  . Arrhythmia Father        A-Fib  . Arrhythmia Brother        A-Fib  . Heart attack Neg Hx   . Hypertension Neg Hx   . Stroke Neg Hx    SOCIAL HISTORY:  reports that he has been smoking cigars and cigarettes.  he has never used smokeless tobacco. He reports that he drinks alcohol. He reports that he does not use drugs.  REVIEW OF SYSTEMS:   Constitutional: Feels well. Cardiovascular: No chest pain.  Pulmonary: Denies dyspnea.   The remainder of systems were reviewed and were found to be negative other than what is documented in the HPI.    VITAL SIGNS: Temp:  [98.3 F (36.8 C)-101.8 F (38.8 C)] 98.4 F (36.9 C) (12/07 0434) Pulse Rate:  [77-90] 90 (12/07 0840) Resp:  [16-20] 18 (12/07 0434) BP: (108-119)/(53-60) 108/58 (12/07 0840) SpO2:  [98 %-100 %] 98 % (12/07 0840) HEMODYNAMICS:   VENTILATOR SETTINGS:   INTAKE / OUTPUT:  Intake/Output Summary (Last 24 hours) at 09/20/2017 1133 Last data filed at 09/19/2017 2200 Gross per 24 hour  Intake -  Output 420 ml  Net -420 ml    Physical Examination:   VS: BP (!) 108/58 (BP Location: Right Arm)   Pulse 90   Temp 98.4 F (36.9 C) (Oral)   Resp 18   Ht 5\' 9"  (1.753 m)   Wt 79.4 kg (175 lb)   SpO2 98%   BMI 25.84 kg/m   General Appearance: No distress  Neuro:without focal findings, mental status, speech normal,. HEENT: PERRLA, EOM intact, no ptosis, no other lesions noticed;  Pulmonary: Scant crackles appreciated bilateral posterior lung sounds Cardiovascular  irregularly irregular rhythm, controlled ventricular response, harsh crescendo murmur appreciated at the mitral post, systolic Abdomen: Benign, Soft, non-tender, No masses, hepatosplenomegaly, No lymphadenopathy Renal:  No costovertebral tenderness  Endoc: No evident thyromegaly, no signs of acromegaly. Skin:   warm, no rashes, no ecchymosis  Extremities: normal, no cyanosis, clubbing, no edema, warm with normal capillary refill.    LABS: Reviewed   LABORATORY PANEL:   CBC Recent Labs  Lab 09/20/17 0720  WBC 8.4  HGB 12.7*  HCT 36.5*  PLT 156    Chemistries  Recent Labs  Lab 09/20/17 0720  NA 131*  K 4.1  CL 100*  CO2 22  GLUCOSE 134*  BUN 20  CREATININE 0.95  CALCIUM 8.4*    Recent Labs  Lab 09/18/17 2314 09/19/17 0756 09/19/17 1126 09/19/17 1636 09/19/17 2114 09/20/17 0752  GLUCAP 239* 174* 181* 151* 129* 135*   No results for input(s): PHART, PCO2ART, PO2ART in the last 168 hours. No results for input(s): AST, ALT, ALKPHOS, BILITOT, ALBUMIN in the last 168 hours.  Cardiac Enzymes Recent Labs  Lab 09/19/17 1105  TROPONINI 0.11*    RADIOLOGY:  Dg Chest Portable 1 View  Result Date: 09/18/2017 CLINICAL DATA:  Shortness of breath.  History of atrial fibrillation EXAM: PORTABLE CHEST 1 VIEW COMPARISON:  April 08, 2017 FINDINGS: There is fibrotic change in the lung bases. There is no frank edema or consolidation. Heart is upper normal in size with pulmonary vascularity within normal limits. No adenopathy. No evident bone lesions. There is aortic atherosclerosis. IMPRESSION: Fibrotic change in the bases. No edema or consolidation. Heart upper normal in size. There is aortic atherosclerosis. Aortic Atherosclerosis (ICD10-I70.0). Electronically Signed   By: Lowella Grip III M.D.   On: 09/18/2017 20:38    09/20/2017, 11:33 AM

## 2017-09-20 NOTE — Telephone Encounter (Signed)
Pt's wife stated she wanted to make sure Dr. Rosanna Randy knew pt was in the hospital with double pneumonia and they were going to do a CT scan. Thanks TNP

## 2017-09-21 LAB — PROTIME-INR
INR: 1.82
PROTHROMBIN TIME: 20.9 s — AB (ref 11.4–15.2)

## 2017-09-21 LAB — GLUCOSE, CAPILLARY
GLUCOSE-CAPILLARY: 102 mg/dL — AB (ref 65–99)
Glucose-Capillary: 141 mg/dL — ABNORMAL HIGH (ref 65–99)

## 2017-09-21 MED ORDER — ALBUTEROL SULFATE HFA 108 (90 BASE) MCG/ACT IN AERS: 2.0000 | INHALATION_SPRAY | Freq: Four times a day (QID) | RESPIRATORY_TRACT | 1 refills | Status: DC | PRN

## 2017-09-21 MED ORDER — GUAIFENESIN-DM 100-10 MG/5ML PO SYRP: 10.0000 mL | ORAL_SOLUTION | Freq: Four times a day (QID) | ORAL | 0 refills | Status: DC | PRN

## 2017-09-21 MED ORDER — LEVOFLOXACIN 500 MG PO TABS: 500.0000 mg | ORAL_TABLET | Freq: Every day | ORAL | 0 refills | Status: AC

## 2017-09-21 MED ORDER — WARFARIN SODIUM 1 MG PO TABS
1.0000 mg | ORAL_TABLET | Freq: Once | ORAL | Status: DC
  Filled 2017-09-21: qty 1

## 2017-09-21 MED ORDER — LEVOFLOXACIN 750 MG PO TABS: 750.0000 mg | ORAL_TABLET | Freq: Every day | ORAL | Status: DC

## 2017-09-21 MED ORDER — ACETAMINOPHEN 325 MG PO TABS: 650.0000 mg | ORAL_TABLET | Freq: Four times a day (QID) | ORAL | Status: DC | PRN

## 2017-09-21 NOTE — Discharge Summary (Addendum)
[facility] NAME: [name] [name]    MR#:  [id number]  DATE OF BIRTH:  03-19-1934  DATE OF ADMISSION:  09/18/2017 ADMITTING PHYSICIAN: Lance Coon, MD  DATE OF DISCHARGE:  09/21/17  PRIMARY CARE PHYSICIAN: [name], MD    ADMISSION DIAGNOSIS:  Hypoxia [R09.02] Community acquired pneumonia of right lung, unspecified part of lung [J18.9]  DISCHARGE DIAGNOSIS:  Principal Problem:   CAP (community acquired pneumonia) Active Problems:   Atrial fibrillation and flutter ([place])   Obstructive sleep apnea   Arteriosclerosis of coronary artery   Diabetes mellitus, type 2 ([place])   Essential (primary) hypertension   Adult hypothyroidism   SECONDARY DIAGNOSIS:   Past Medical History:  Diagnosis Date  . Atherosclerosis of aorta ([place])    by CT scan in past  . Atrial fibrillation ([place])    and aflutter. pt has a left atrial circuit that is not ablated. was on amiodarone-stopped, now use rate control.   . Bladder cancer ([place])    hx; treated with BCG in past   . Carotid artery disease ([place])    There was calcified plaque but no stenosis by carotid artery screening done at [facility] October, 2013  . Diabetes mellitus    type not specified  . Diabetic neuropathy (HCC)    feet  . Elevated liver enzymes    over time; hx  . Excessive sweating   . Glaucoma   . Gout   . Head injury    when slipped on ice 2004-2005. stablaized and back on Coumadin   . Headache    migraines - distant past  . HOCM (hypertrophic obstructive cardiomyopathy) ([place])    a. 01/2016 Echo: EF 65-70%, no rwma, LVOT gradient of 80-55mmHg, mod AS, SAM, sev dil LA, PASP 62mmHg.  [name] Homocystinemia ([place])    elevated, mild   . Hypercholesterolemia    treated.   . Hypertension   . Kidney mass    laproscopic surgery woth cryoablation of a mass outside kidney followed at [facility].   . Moderate aortic stenosis    a. 01/2016 echo: mod AS.    [name] Motion sickness    ocean boats  . Nausea   . Obstructive sleep apnea    CPAP started successfully 2014  . Orthostatic hypotension    a. orthostatic. dehydration. hospitalized 11/11  . Sleep apnea    Significant obstructive sleep apnea diagnosed in August, 2012, the patient is to receive CPAP   . Stroke [facility])    "2 old strokes" CT and MRI. [place] [facility] 11/11. diagnosis was dehydration, no acutal reports.   . TSH elevation    on synthroid historically   . Unsteady gait    August, 2012    HOSPITAL COURSE:   HPI  [name] [name]  is a 83 y.o. male who presents with several days of increasing cough, with acute onset shortness of breath today.  When he arrived to the ED he was initially hypoxic, requiring oxygen via nasal cannula.  Chest x-ray shows likely pneumonia.  Hospitalist were called for admission  # CAP (community acquired pneumonia) - Clinically better. Off BiPAP  AFEBRILE Incentive spirometry Weaned  off oxygen to RA  IV antibiotic levofloxacin to PO Hemodynamically stable  lactic acid normal, blood pressure stable Blood cultures negative so far  #Hypertrophic obstructive cardiomyopathy Outpatient follow-up with cardiology  #Interstitial lung disease with mediastinal lymph nodes outpatient follow-up with pulmonology Appreciate pulmonology recommendations   #  Atrial fibrillation and flutter ([place]) -continue digoxin.  On Coumadin. PT/INR follow-up in 2 days his doctor's office  INR 1.82 today   #Arteriosclerosis of coronary artery -troponin mildly elevated, suspect this is due to demand from his tachycardia from his A. Fib  Troponin 0.10-0.15-0.11 Patient is asymptomatic today  #Diabetes mellitus, type 2 ([place]) -sliding scale insulin with corresponding glucose checks  #Essential (primary) hypertension -continue home meds  #Obstructive sleep apnea -BiPAP nightly provided during the hospital course, outpatient follow-up with  pulmonology  # hypothyroidism -continue Synthroid   PT consult-home health PT     DISCHARGE CONDITIONS:   stable  CONSULTS OBTAINED:     PROCEDURES  None   DRUG ALLERGIES:   Allergies  Allergen Reactions  . Macrolides And Ketolides Other (See Comments)  . Mycinettes   . Nitrofuran Derivatives Other (See Comments)  . Nitrofurantoin Other (See Comments)  . Erythromycin Itching and Rash    Other reaction(s): UNKNOWN  And red skin    DISCHARGE MEDICATIONS:   Allergies as of 09/21/2017      Reactions   Macrolides And Ketolides Other (See Comments)   Mycinettes    Nitrofuran Derivatives Other (See Comments)   Nitrofurantoin Other (See Comments)   Erythromycin Itching, Rash   Other reaction(s): UNKNOWN  And red skin      Medication List    TAKE these medications   acetaminophen 325 MG tablet Commonly known as:  TYLENOL Take 2 tablets (650 mg total) by mouth every 6 (six) hours as needed for mild pain (or Fever >/= 101).   albuterol 108 (90 Base) MCG/ACT inhaler Commonly known as:  PROVENTIL HFA;VENTOLIN HFA Inhale 2 puffs into the lungs every 6 (six) hours as needed for wheezing or shortness of breath.   Calcium-Vitamin D 600-200 MG-UNIT tablet Take 1 tablet by mouth daily.   co-enzyme Q-10 30 MG capsule Take 30 mg by mouth daily.   digoxin 0.125 MG tablet Commonly known as:  LANOXIN TAKE ONE TABLET BY MOUTH ONCE DAILY   fish oil-omega-3 fatty acids 1000 MG capsule Take 2 g by mouth 2 (two) times daily.   Gelatin Capsules (Empty) Caps 2 capsules by Does not apply route 2 (two) times daily.   Glucosamine 500 MG Caps Take 1 capsule by mouth daily.   glucose blood test strip   glucose blood test strip Commonly known as:  ONE TOUCH ULTRA TEST USE ONE STRIP TO CHECK GLUCOSE ONCE DAILY   guaiFENesin-dextromethorphan 100-10 MG/5ML syrup Commonly known as:  ROBITUSSIN DM Take 10 mLs by mouth every 6 (six) hours as needed for cough.    latanoprost 0.005 % ophthalmic solution Commonly known as:  XALATAN Place 1 drop into both eyes at bedtime.   levofloxacin 500 MG tablet Commonly known as:  LEVAQUIN Take 1 tablet (500 mg total) by mouth daily for 5 days.   levothyroxine 50 MCG tablet Commonly known as:  SYNTHROID, LEVOTHROID TAKE ONE TABLET BY MOUTH ONCE DAILY   metFORMIN 500 MG tablet Commonly known as:  GLUCOPHAGE TAKE ONE TABLET BY MOUTH ONCE DAILY   ONETOUCH DELICA LANCETS 11B Misc USE ONE LANCET TO CHECK BLOOD GLUCOSE ONCE DAILY   POTASSIUM PO Take 1 tablet by mouth daily.   simvastatin 40 MG tablet Commonly known as:  ZOCOR Take 0.5 tablets (20 mg total) by mouth at bedtime.   verapamil 240 MG CR tablet Commonly known as:  CALAN-SR Take 1 tablet (240 mg total) by mouth daily. What changed:  Another medication  with the same name was removed. Continue taking this medication, and follow the directions you see here.   vitamin B-12 500 MCG tablet Commonly known as:  CYANOCOBALAMIN Take 500 mcg by mouth daily.   vitamin C 500 MG tablet Commonly known as:  ASCORBIC ACID Take 500 mg by mouth daily.   warfarin 5 MG tablet Commonly known as:  COUMADIN Take as directed. If you are unsure how to take this medication, talk to your nurse or doctor. Original instructions:  TAKE AS DIRECTED BY  COUMADIN  CLINIC What changed:    how much to take  how to take this  when to take this  additional instructions  Another medication with the same name was removed. Continue taking this medication, and follow the directions you see here.        DISCHARGE INSTRUCTIONS:  Follow-up with primary care physician in a week Follow-up with cardiology Dr. Fletcher Anon in 2 weeks Follow up with pulmonology Dr. Jamal Collin in  3-4 weeks Continue home health PT  Repeat PT/INR in 2 days at Coumadin clinic for further management of Coumadin by primary care physician OR cardiology  DIET:  Cardiac diet and Diabetic  diet  DISCHARGE CONDITION:  Stable  ACTIVITY:  Activity as tolerated  OXYGEN:  Home Oxygen: No.   Oxygen Delivery: room air  DISCHARGE LOCATION:  home   If you experience worsening of your admission symptoms, develop shortness of breath, life threatening emergency, suicidal or homicidal thoughts you must seek medical attention immediately by calling 911 or calling your MD immediately  if symptoms less severe.  You Must read complete instructions/literature along with all the possible adverse reactions/side effects for all the Medicines you take and that have been prescribed to you. Take any new Medicines after you have completely understood and accpet all the possible adverse reactions/side effects.   Please note  You were cared for by a hospitalist during your hospital stay. If you have any questions about your discharge medications or the care you received while you were in the hospital after you are discharged, you can call the unit and asked to speak with the hospitalist on call if the hospitalist that took care of you is not available. Once you are discharged, your primary care physician will handle any further medical issues. Please note that NO REFILLS for any discharge medications will be authorized once you are discharged, as it is imperative that you return to your primary care physician (or establish a relationship with a primary care physician if you do not have one) for your aftercare needs so that they can reassess your need for medications and monitor your lab values.     Today  Chief Complaint  Patient presents with  . Shortness of Breath   Patient is feeling much better today. Ambulated in the hallway on room air without any shortness of breath. Wants to go home. Family at bedside. No fever  ROS:  CONSTITUTIONAL: Denies fevers, chills. Denies any fatigue, weakness.  EYES: Denies blurry vision, double vision, eye pain. EARS, NOSE, THROAT: Denies tinnitus, ear  pain, hearing loss. RESPIRATORY: Denies cough, wheeze, shortness of breath.  CARDIOVASCULAR: Denies chest pain, palpitations, edema.  GASTROINTESTINAL: Denies nausea, vomiting, diarrhea, abdominal pain. Denies bright red blood per rectum. GENITOURINARY: Denies dysuria, hematuria. ENDOCRINE: Denies nocturia or thyroid problems. HEMATOLOGIC AND LYMPHATIC: Denies easy bruising or bleeding. SKIN: Denies rash or lesion. MUSCULOSKELETAL: Denies pain in neck, back, shoulder, knees, hips or arthritic symptoms.  NEUROLOGIC: Denies paralysis,  paresthesias.  PSYCHIATRIC: Denies anxiety or depressive symptoms.   VITAL SIGNS:  Blood pressure 105/63, pulse 94, temperature 97.7 F (36.5 C), temperature source Oral, resp. rate 18, height 5\' 9"  (1.753 m), weight 79.4 kg (175 lb), SpO2 98 %.  I/O:    Intake/Output Summary (Last 24 hours) at 09/21/2017 1229 Last data filed at 09/21/2017 0959 Gross per 24 hour  Intake 2121 ml  Output 250 ml  Net 1871 ml    PHYSICAL EXAMINATION:  GENERAL:  83 y.o.-year-old patient lying in the bed with no acute distress.  EYES: Pupils equal, round, reactive to light and accommodation. No scleral icterus. Extraocular muscles intact.  HEENT: Head atraumatic, normocephalic. Oropharynx and nasopharynx clear.  NECK:  Supple, no jugular venous distention. No thyroid enlargement, no tenderness.  LUNGS: Normal breath sounds bilaterally, no wheezing, rales,rhonchi or crepitation. No use of accessory muscles of respiration.  CARDIOVASCULAR: S1, S2 normal. No murmurs, rubs, or gallops.  ABDOMEN: Soft, non-tender, non-distended. Bowel sounds present.  EXTREMITIES: No pedal edema, cyanosis, or clubbing.  NEUROLOGIC: Cranial nerves II through XII are intact. Muscle strength 5/5 in all extremities. Sensation intact. Gait not checked.  PSYCHIATRIC: The patient is alert and oriented x 3.  SKIN: No obvious rash, lesion, or ulcer.   DATA REVIEW:   CBC Recent Labs  Lab  09/20/17 0720  WBC 8.4  HGB 12.7*  HCT 36.5*  PLT 156    Chemistries  Recent Labs  Lab 09/20/17 0720  NA 131*  K 4.1  CL 100*  CO2 22  GLUCOSE 134*  BUN 20  CREATININE 0.95  CALCIUM 8.4*    Cardiac Enzymes Recent Labs  Lab 09/19/17 1105  TROPONINI 0.11*    Microbiology Results  Results for orders placed or performed during the hospital encounter of 09/18/17  Blood Culture (routine x 2)     Status: None (Preliminary result)   Collection Time: 09/18/17  8:49 PM  Result Value Ref Range Status   Specimen Description BLOOD BLOOD RIGHT FOREARM  Final   Special Requests   Final    BOTTLES DRAWN AEROBIC AND ANAEROBIC Blood Culture adequate volume   Culture NO GROWTH 3 DAYS  Final   Report Status PENDING  Incomplete  Blood Culture (routine x 2)     Status: None (Preliminary result)   Collection Time: 09/18/17  8:49 PM  Result Value Ref Range Status   Specimen Description BLOOD LEFT ANTECUBITAL  Final   Special Requests   Final    BOTTLES DRAWN AEROBIC AND ANAEROBIC Blood Culture adequate volume   Culture NO GROWTH 3 DAYS  Final   Report Status PENDING  Incomplete    RADIOLOGY:  Dg Chest Portable 1 View  Result Date: 09/18/2017 CLINICAL DATA:  Shortness of breath.  History of atrial fibrillation EXAM: PORTABLE CHEST 1 VIEW COMPARISON:  April 08, 2017 FINDINGS: There is fibrotic change in the lung bases. There is no frank edema or consolidation. Heart is upper normal in size with pulmonary vascularity within normal limits. No adenopathy. No evident bone lesions. There is aortic atherosclerosis. IMPRESSION: Fibrotic change in the bases. No edema or consolidation. Heart upper normal in size. There is aortic atherosclerosis. Aortic Atherosclerosis (ICD10-I70.0). Electronically Signed   By: Lowella Grip III M.D.   On: 09/18/2017 20:38    EKG:   Orders placed or performed during the hospital encounter of 09/18/17  . ED EKG  . ED EKG  . EKG 12-Lead  . EKG 12-Lead  .  ED  EKG 12-Lead  . ED EKG 12-Lead      Management plans discussed with the patient, wife and daughter at bedside and they are in agreement.  CODE STATUS:     Code Status Orders  (From admission, onward)        Start     Ordered   09/18/17 2315  Full code  Continuous     09/18/17 2314    Code Status History    Date Active Date Inactive Code Status Order ID Comments User Context   This patient has a current code status but no historical code status.    Advance Directive Documentation     Most Recent Value  Type of Advance Directive  Living will, Healthcare Power of Attorney  Pre-existing out of facility DNR order (yellow form or pink MOST form)  No data  "MOST" Form in Place?  No data      TOTAL TIME TAKING CARE OF THIS PATIENT: 45  minutes.   Note: This dictation was prepared with Dragon dictation along with smaller phrase technology. Any transcriptional errors that result from this process are unintentional.   @MEC @  on 09/21/2017 at 12:29 PM  Between 7am to 6pm - Pager - [phone]  After 6pm go to [email or web address] - password [email or web address] [facility]  [facility]  Office  [phone]  CC: Primary care physician; [name], MD

## 2017-09-21 NOTE — Discharge Instructions (Signed)
Follow-up with primary care physician in a week Follow-up with cardiology Dr. Fletcher Anon in 2 weeks Follow up with pulmonology Dr. Jamal Collin in  3-4 weeks Continue home health PT Repeat PT/INR in 2 days at Coumadin clinic for further management of Coumadin by primary care physician OR cardiology

## 2017-09-21 NOTE — Progress Notes (Signed)
ANTICOAGULATION CONSULT NOTE follow up  Pharmacy Consult for warfarin Indication: atrial fibrillation  Allergies  Allergen Reactions  . Macrolides And Ketolides Other (See Comments)  . Mycinettes   . Nitrofuran Derivatives Other (See Comments)  . Nitrofurantoin Other (See Comments)  . Erythromycin Itching and Rash    Other reaction(s): UNKNOWN  And red skin    Patient Measurements: Height: 5\' 9"  (175.3 cm) Weight: 175 lb (79.4 kg) IBW/kg (Calculated) : 70.7 Heparin Dosing Weight: 79.4 kg  Vital Signs: Temp: 97.7 F (36.5 C) (12/08 0934) Temp Source: Oral (12/08 0934) BP: 105/63 (12/08 0934) Pulse Rate: 94 (12/08 0934)  Labs: Recent Labs    09/18/17 2022 09/19/17 0237 09/19/17 0510 09/19/17 1105 09/20/17 0720 09/21/17 0426  HGB 13.7  --  12.4*  --  12.7*  --   HCT 40.4  --  35.3*  --  36.5*  --   PLT 199  --  169  --  156  --   LABPROT 22.1*  --  21.7*  --  21.4* 20.9*  INR 1.95  --  1.91  --  1.87 1.82  CREATININE 1.19  --  0.99  --  0.95  --   TROPONINI 0.03* 0.10* 0.15* 0.11*  --   --     Estimated Creatinine Clearance: 61 mL/min (by C-G formula based on SCr of 0.95 mg/dL).   Medical History: Past Medical History:  Diagnosis Date  . Atherosclerosis of aorta ([place])    by CT scan in past  . Atrial fibrillation ([place])    and aflutter. pt has a left atrial circuit that is not ablated. was on amiodarone-stopped, now use rate control.   . Bladder cancer ([place])    hx; treated with BCG in past   . Carotid artery disease ([place])    There was calcified plaque but no stenosis by carotid artery screening done at [facility] October, 2013  . Diabetes mellitus    type not specified  . Diabetic neuropathy (HCC)    feet  . Elevated liver enzymes    over time; hx  . Excessive sweating   . Glaucoma   . Gout   . Head injury    when slipped on ice 2004-2005. stablaized and back on Coumadin   . Headache    migraines - distant past  . HOCM (hypertrophic  obstructive cardiomyopathy) ([place])    a. 01/2016 Echo: EF 65-70%, no rwma, LVOT gradient of 80-49mmHg, mod AS, SAM, sev dil LA, PASP 62mmHg.  [name] Homocystinemia ([place])    elevated, mild   . Hypercholesterolemia    treated.   . Hypertension   . Kidney mass    laproscopic surgery woth cryoablation of a mass outside kidney followed at [facility].   . Moderate aortic stenosis    a. 01/2016 echo: mod AS.   [name] Motion sickness    ocean boats  . Nausea   . Obstructive sleep apnea    CPAP started successfully 2014  . Orthostatic hypotension    a. orthostatic. dehydration. hospitalized 11/11  . Sleep apnea    Significant obstructive sleep apnea diagnosed in August, 2012, the patient is to receive CPAP   . Stroke [facility])    "2 old strokes" CT and MRI. [place] hospital 11/11. diagnosis was dehydration, no acutal reports.   . TSH elevation    on synthroid historically   . Unsteady gait    August, 2012    Medications:  Scheduled:  . digoxin  125 mcg  Oral Daily  . insulin aspart  0-5 Units Subcutaneous QHS  . insulin aspart  0-9 Units Subcutaneous TID WC  . latanoprost  1 drop Both Eyes QHS  . levothyroxine  50 mcg Oral QAC breakfast  . simvastatin  20 mg Oral QHS  . verapamil  240 mg Oral Daily  . [START ON 09/22/2017] warfarin  2.5 mg Oral Once per day on Sun Wed  . warfarin  5 mg Oral Once per day on Mon Tue Thu Fri Sat  . Warfarin - Pharmacist Dosing Inpatient   Does not apply q1800    Assessment: Patient admitted for SOB is anticoagulated PTA w/ warfarin for afib. Warfarin regimen: Warfarin 5 mg Mon-Tues-Thurs-Fri-Sat Warfarin 2.5 mg Sun-Wed  12/5 @ 1000 INR 2.3 12/5 @ 2022 INR 1.95  12/6  INR  1.91  Warfarin 5mg  12/7  INR  1.87  Warfarin 6mg  12/8  INR  1.82   Goal of Therapy:  INR 2-3 Monitor platelets by anticoagulation protocol: Yes   Plan:  Will increase Warfarin to 6 mg again tonight (5 mg and 1 mg tablet ordered). Then continue home regimen. Patient is on levaquin 750 mg  for CAP Will monitor INR daily and CBC's per protocol and adjust per INR trends.   Olivia Canter, [facility] Clinical Pharmacist 09/21/2017

## 2017-09-21 NOTE — Progress Notes (Signed)
Discharge instructions along with home medications and follow up gone over with patient and wife. Both verbalize that they understood instructions. 2 prescriptions given to patient. IV's removed. Pt being discharged home on room air, no distress noted. Ammie Dalton, RN

## 2017-09-21 NOTE — Care Management Note (Signed)
Case Management Note  Patient Details  Name: [name] [name] MRN: [id number] Date of Birth: 15-Dec-1933  Subjective/Objective:  Discussed discharge planning with [name]. She chose [place]. A referral for HH=PT was called to Melene Muller at [facility].                  Action/Plan:   Expected Discharge Date:  09/21/17               Expected Discharge Plan:  [place]  In-House Referral:     Discharge planning Services  CM Consult  Post Acute Care Choice:  Home Health Choice offered to:  Spouse  DME Arranged:  N/A DME Agency:  NA  HH Arranged:  PT [place] Agency:  [place]  Status of Service:  Completed, signed off  If discussed at [place] of Stay Meetings, dates discussed:    Additional Comments:  [name], RN 09/21/2017, 12:23 PM

## 2017-09-23 LAB — CULTURE, BLOOD (ROUTINE X 2)
CULTURE: NO GROWTH
Culture: NO GROWTH
SPECIAL REQUESTS: ADEQUATE
SPECIAL REQUESTS: ADEQUATE

## 2017-09-30 ENCOUNTER — Ambulatory Visit (INDEPENDENT_AMBULATORY_CARE_PROVIDER_SITE_OTHER): Payer: PPO

## 2017-09-30 ENCOUNTER — Encounter: Payer: Self-pay | Admitting: Family Medicine

## 2017-09-30 ENCOUNTER — Ambulatory Visit: Payer: PPO | Admitting: Family Medicine

## 2017-09-30 VITALS — BP 108/42 | HR 80 | Temp 97.7°F | Resp 18 | Wt 186.0 lb

## 2017-09-30 DIAGNOSIS — I482 Chronic atrial fibrillation, unspecified: Secondary | ICD-10-CM

## 2017-09-30 DIAGNOSIS — I10 Essential (primary) hypertension: Secondary | ICD-10-CM

## 2017-09-30 DIAGNOSIS — I4891 Unspecified atrial fibrillation: Secondary | ICD-10-CM

## 2017-09-30 DIAGNOSIS — Z09 Encounter for follow-up examination after completed treatment for conditions other than malignant neoplasm: Secondary | ICD-10-CM

## 2017-09-30 DIAGNOSIS — Z5181 Encounter for therapeutic drug level monitoring: Secondary | ICD-10-CM | POA: Diagnosis not present

## 2017-09-30 DIAGNOSIS — C649 Malignant neoplasm of unspecified kidney, except renal pelvis: Secondary | ICD-10-CM | POA: Diagnosis not present

## 2017-09-30 DIAGNOSIS — E119 Type 2 diabetes mellitus without complications: Secondary | ICD-10-CM | POA: Diagnosis not present

## 2017-09-30 DIAGNOSIS — J189 Pneumonia, unspecified organism: Secondary | ICD-10-CM | POA: Diagnosis not present

## 2017-09-30 DIAGNOSIS — I4892 Unspecified atrial flutter: Secondary | ICD-10-CM | POA: Diagnosis not present

## 2017-09-30 DIAGNOSIS — D090 Carcinoma in situ of bladder: Secondary | ICD-10-CM | POA: Diagnosis not present

## 2017-09-30 LAB — POCT INR: INR: 3

## 2017-09-30 NOTE — Patient Instructions (Signed)
Please take 1/2 tablet tonight, then continue dosage of  1 tablet everyday except 1/2 tablet on Sundays and Fridays.   Recheck in 3 weeks after you get back in town.

## 2017-09-30 NOTE — Patient Instructions (Signed)
Cut Verapamil in half.

## 2017-09-30 NOTE — Progress Notes (Signed)
[name] [name]  MRN: [id number] DOB: [birth year]  Subjective:  HPI  Patient is here for hospital follow up. [place] stay was 12/5-12/8/18. Diagnoses was Pneumonia. Patient had to be on oxygen supply when he was first admitted and then was weaned off. Patient was put on Levaquin.  Chest Xray and lab work was done, also blood culture-which was negative. Patient was given 2 or 3 antibiotics by IV and then discharged on Levaquin by mouth. Patient has finished this medication. Cough is better and breathing is better. Still feels fatigue, weakness, slight cough with some productivity. He has not had to use albuterol inhaler, took Robitussin 2 times since the hospital stay.  He was suppose to follow up with cardiologist and pulmonologist after hospital stay but could not get an appointment with them until late January 2019.  Patient Active Problem List   Diagnosis Date Noted  . H/O: rheumatic fever 03/21/2017  . Tinnitus 10/30/2016  . On warfarin therapy 07/06/2015  . Arteriosclerosis of coronary artery 06/13/2015  . Carcinoma in situ of bladder 06/13/2015  . Diabetes mellitus, type 2 ([place]) 06/13/2015  . Diabetic neuropathy ([place]) 06/13/2015  . Dizziness 06/13/2015  . Essential (primary) hypertension 06/13/2015  . Fatigue 06/13/2015  . Glaucoma 06/13/2015  . Hypercholesteremia 06/13/2015  . Male hypogonadism 06/13/2015  . Adult hypothyroidism 06/13/2015  . Arthritis, degenerative 06/13/2015  . CAP (community acquired pneumonia) 06/13/2015  . Adenocarcinoma, renal cell ([place]) 06/13/2015  . Benign head tremor 12/13/2014  . Carotid arterial disease ([place]) 07/12/2014  . Decreased cardiac output 07/12/2014  . Cardiomyopathy, hypertrophic obstructive ([place]) 07/12/2014  . Head injuries 07/12/2014  . HOCM (hypertrophic obstructive cardiomyopathy) ([place])   . Encounter for therapeutic drug monitoring 11/16/2013  . Obstructive sleep apnea   . Benign prostatic hypertrophy without urinary obstruction  06/09/2012  . History of neoplasm of bladder 06/09/2012  . Head injury   . Elevated liver enzymes   . Atherosclerosis of aorta ([place])   . Hypotension   . Stroke ([place])   . Unsteady gait   . Excessive sweating   . Nausea   . ORTHOSTATIC HYPOTENSION, HX OF 09/01/2010  . Overweight(278.02) 03/07/2009  . MITRAL REGURGITATION 03/05/2009  . Chronic atrial fibrillation ([place]) 03/05/2009  . Atrial fibrillation and flutter ([place]) 03/05/2009  . Mitral and aortic incompetence 03/05/2009    Past Medical History:  Diagnosis Date  . Atherosclerosis of aorta ([place])    by CT scan in past  . Atrial fibrillation ([place])    and aflutter. pt has a left atrial circuit that is not ablated. was on amiodarone-stopped, now use rate control.   . Bladder cancer ([place])    hx; treated with BCG in past   . Carotid artery disease ([place])    There was calcified plaque but no stenosis by carotid artery screening done at [facility] October, 2013  . Diabetes mellitus    type not specified  . Diabetic neuropathy (HCC)    feet  . Elevated liver enzymes    over time; hx  . Excessive sweating   . Glaucoma   . Gout   . Head injury    when slipped on ice 2004-2005. stablaized and back on Coumadin   . Headache    migraines - distant past  . HOCM (hypertrophic obstructive cardiomyopathy) ([place])    a. 01/2016 Echo: EF 65-70%, no rwma, LVOT gradient of 80-76mmHg, mod AS, SAM, sev dil LA, PASP 75mmHg.  [name] Homocystinemia ([place])    elevated, mild   .  Hypercholesterolemia    treated.   . Hypertension   . Kidney mass    laproscopic surgery woth cryoablation of a mass outside kidney followed at [facility].   . Moderate aortic stenosis    a. 01/2016 echo: mod AS.   [name] Motion sickness    ocean boats  . Nausea   . Obstructive sleep apnea    CPAP started successfully 2014  . Orthostatic hypotension    a. orthostatic. dehydration. hospitalized 11/11  . Sleep apnea    Significant obstructive sleep apnea diagnosed in  August, 2012, the patient is to receive CPAP   . Stroke [facility])    "2 old strokes" CT and MRI. [place] [facility] 11/11. diagnosis was dehydration, no acutal reports.   . TSH elevation    on synthroid historically   . Unsteady gait    August, 2012    Social History   Socioeconomic History  . Marital status: Married    Spouse name: Not on file  . Number of children: Not on file  . Years of education: Not on file  . Highest education level: Not on file  Social Needs  . Financial resource strain: Not hard at all  . Food insecurity - worry: Never true  . Food insecurity - inability: Never true  . Transportation needs - medical: No  . Transportation needs - non-medical: No  Occupational History  . Not on file  Tobacco Use  . Smoking status: Former Smoker    Types: Cigars, Cigarettes    Last attempt to quit: 10/02/1974    Years since quitting: 43.0  . Smokeless tobacco: Never Used  . Tobacco comment: still smokes cigars once a day  Substance and Sexual Activity  . Alcohol use: Yes    Comment: rare - 1 beer  . Drug use: No  . Sexual activity: Yes  Other Topics Concern  . Not on file  Social History Narrative   Married, retired, gets regular exercise.     Outpatient Encounter Medications as of 09/30/2017  Medication Sig Note  . acetaminophen (TYLENOL) 325 MG tablet Take 2 tablets (650 mg total) by mouth every 6 (six) hours as needed for mild pain (or Fever >/= 101).   [name] albuterol (PROVENTIL HFA;VENTOLIN HFA) 108 (90 Base) MCG/ACT inhaler Inhale 2 puffs into the lungs every 6 (six) hours as needed for wheezing or shortness of breath.   . Calcium-Vitamin D 600-200 MG-UNIT per tablet Take 1 tablet by mouth daily.     [name] co-enzyme Q-10 30 MG capsule Take 30 mg by mouth daily.     . digoxin (LANOXIN) 0.125 MG tablet TAKE ONE TABLET BY MOUTH ONCE DAILY   . fish oil-omega-3 fatty acids 1000 MG capsule Take 2 g by mouth 2 (two) times daily.     . Gelatin Capsules, Empty, CAPS 2  capsules by Does not apply route 2 (two) times daily.    . Glucosamine 500 MG CAPS Take 1 capsule by mouth daily.     [name] glucose blood (ONE TOUCH ULTRA TEST) test strip USE ONE STRIP TO CHECK GLUCOSE ONCE DAILY   . glucose blood test strip  06/13/2015: strips and lancets.  DX: E11.9 Received from: [employer]  . guaiFENesin-dextromethorphan (ROBITUSSIN DM) 100-10 MG/5ML syrup Take 10 mLs by mouth every 6 (six) hours as needed for cough.   . latanoprost (XALATAN) 0.005 % ophthalmic solution Place 1 drop into both eyes at bedtime.    [name] levothyroxine (SYNTHROID, LEVOTHROID) 50 MCG tablet  TAKE ONE TABLET BY MOUTH ONCE DAILY   . metFORMIN (GLUCOPHAGE) 500 MG tablet TAKE ONE TABLET BY MOUTH ONCE DAILY   . ONETOUCH DELICA LANCETS 42P MISC USE ONE LANCET TO CHECK BLOOD GLUCOSE ONCE DAILY   . POTASSIUM PO Take 1 tablet by mouth daily.     . simvastatin (ZOCOR) 40 MG tablet Take 0.5 tablets (20 mg total) by mouth at bedtime.   . verapamil (CALAN-SR) 240 MG CR tablet Take 1 tablet (240 mg total) by mouth daily.   . vitamin B-12 (CYANOCOBALAMIN) 500 MCG tablet Take 500 mcg by mouth daily.     . vitamin C (ASCORBIC ACID) 500 MG tablet Take 500 mg by mouth daily.     [name] warfarin (COUMADIN) 5 MG tablet TAKE AS DIRECTED BY  COUMADIN  CLINIC (Patient taking differently: Take 5 mg by mouth daily. TAKE AS DIRECTED BY  COUMADIN  CLINIC)    No facility-administered encounter medications on file as of 09/30/2017.     Allergies  Allergen Reactions  . Macrolides And Ketolides Other (See Comments)  . Mycinettes   . Nitrofuran Derivatives Other (See Comments)  . Nitrofurantoin Other (See Comments)  . Erythromycin Itching and Rash    Other reaction(s): UNKNOWN  And red skin    Review of Systems  Constitutional: Positive for malaise/fatigue. Negative for chills and fever.  Eyes: Negative.   Respiratory: Positive for cough, sputum production and shortness of breath.   Cardiovascular: Negative.     Gastrointestinal: Negative.   Musculoskeletal: Positive for joint pain (right knee and hip).  Skin: Negative.   Neurological: Positive for weakness. Negative for dizziness, tingling, tremors and headaches.  Endo/Heme/Allergies: Negative.   Psychiatric/Behavioral: Negative.     Objective:  BP (!) 108/42   Pulse 80   Temp 97.7 F (36.5 C)   Resp 18   Wt 186 lb (84.4 kg)   SpO2 97%   BMI 27.47 kg/m   Physical Exam  Constitutional: He is oriented to person, place, and time and well-developed, well-nourished, and in no distress.  HENT:  Head: Normocephalic and atraumatic.  Right Ear: External ear normal.  Left Ear: External ear normal.  Nose: Nose normal.  Eyes: Conjunctivae are normal. No scleral icterus.  Neck: No thyromegaly present.  Cardiovascular: Normal rate, regular rhythm and normal heart sounds.  Pulmonary/Chest: Effort normal and breath sounds normal.  Abdominal: Soft.  Musculoskeletal: He exhibits no edema.  Neurological: He is alert and oriented to person, place, and time. Gait normal. GCS score is 15.  Skin: Skin is warm and dry.  Psychiatric: Mood, memory, affect and judgment normal.    Assessment and Plan :  CAP Recheck 4-6 weeks and will repeat CXR then. Afib HTN Cut verapamil dose in half.  TIIDM ASCVD Cardiomyopathy Renal Cell Carcinoma OSA  I have done the exam and reviewed the chart and it is accurate to the best of my knowledge. Development worker, community has been used and  any errors in dictation or transcription are unintentional. Miguel Aschoff M.D. [facility]

## 2017-10-03 DIAGNOSIS — E119 Type 2 diabetes mellitus without complications: Secondary | ICD-10-CM | POA: Diagnosis not present

## 2017-10-03 DIAGNOSIS — L851 Acquired keratosis [keratoderma] palmaris et plantaris: Secondary | ICD-10-CM | POA: Diagnosis not present

## 2017-10-03 DIAGNOSIS — B351 Tinea unguium: Secondary | ICD-10-CM | POA: Diagnosis not present

## 2017-10-21 ENCOUNTER — Ambulatory Visit (INDEPENDENT_AMBULATORY_CARE_PROVIDER_SITE_OTHER): Payer: PPO

## 2017-10-21 DIAGNOSIS — I4892 Unspecified atrial flutter: Secondary | ICD-10-CM

## 2017-10-21 DIAGNOSIS — Z5181 Encounter for therapeutic drug level monitoring: Secondary | ICD-10-CM

## 2017-10-21 DIAGNOSIS — I4891 Unspecified atrial fibrillation: Secondary | ICD-10-CM

## 2017-10-21 DIAGNOSIS — I482 Chronic atrial fibrillation, unspecified: Secondary | ICD-10-CM

## 2017-10-21 LAB — POCT INR: INR: 3

## 2017-10-21 NOTE — Patient Instructions (Signed)
Please take 1/2 tablet tonight, then start new dosage of 1 tablet everyday except 1/2 tablet on SUNDAYS, [place]. Recheck in 2 weeks.

## 2017-10-23 ENCOUNTER — Telehealth: Payer: Self-pay | Admitting: Family Medicine

## 2017-10-29 ENCOUNTER — Ambulatory Visit
Admission: RE | Admit: 2017-10-29 | Discharge: 2017-10-29 | Disposition: A | Discharge status: Home / Self Care | Payer: PPO | Source: Ambulatory Visit | Attending: Family Medicine | Admitting: Family Medicine

## 2017-10-29 ENCOUNTER — Encounter: Payer: Self-pay | Admitting: Family Medicine

## 2017-10-29 ENCOUNTER — Ambulatory Visit (INDEPENDENT_AMBULATORY_CARE_PROVIDER_SITE_OTHER): Payer: PPO | Admitting: Family Medicine

## 2017-10-29 VITALS — BP 110/52 | HR 86 | Temp 97.9°F | Resp 16 | Wt 187.6 lb

## 2017-10-29 DIAGNOSIS — R011 Cardiac murmur, unspecified: Secondary | ICD-10-CM | POA: Diagnosis not present

## 2017-10-29 DIAGNOSIS — I482 Chronic atrial fibrillation, unspecified: Secondary | ICD-10-CM

## 2017-10-29 DIAGNOSIS — E119 Type 2 diabetes mellitus without complications: Secondary | ICD-10-CM

## 2017-10-29 DIAGNOSIS — I10 Essential (primary) hypertension: Secondary | ICD-10-CM | POA: Diagnosis not present

## 2017-10-29 DIAGNOSIS — J189 Pneumonia, unspecified organism: Secondary | ICD-10-CM

## 2017-10-29 NOTE — Progress Notes (Signed)
[name] [name]  MRN: [id number] DOB: 03/21/1934  Subjective:  HPI  Patient is here for 1 month follow up. Last office visit was on 09/30/17 for hospital follow up on pneumonia. Patient was also told on last visit to decrease Verapamil dose in half due to b/p reading at that time. Patient is doing this regimen, he has noticed that his b/p is getting higher every morning, this morning it was 148/80-90. BP Readings from Last 3 Encounters:  10/29/17 (!) 110/52  09/30/17 (!) 108/42  09/21/17 105/63   Patient is due for follow up on chest xray for pneumonia. Patient states he is doing better but still gives out of breath faster than before, some chest tightness but no pain present. Cough is off and on not constant. No fever, or chills.  Patient Active Problem List   Diagnosis Date Noted  . H/O: rheumatic fever 03/21/2017  . Tinnitus 10/30/2016  . On warfarin therapy 07/06/2015  . Arteriosclerosis of coronary artery 06/13/2015  . Carcinoma in situ of bladder 06/13/2015  . Diabetes mellitus, type 2 ([place]) 06/13/2015  . Diabetic neuropathy ([place]) 06/13/2015  . Dizziness 06/13/2015  . Essential (primary) hypertension 06/13/2015  . Fatigue 06/13/2015  . Glaucoma 06/13/2015  . Hypercholesteremia 06/13/2015  . Male hypogonadism 06/13/2015  . Adult hypothyroidism 06/13/2015  . Arthritis, degenerative 06/13/2015  . CAP (community acquired pneumonia) 06/13/2015  . Adenocarcinoma, renal cell ([place]) 06/13/2015  . Benign head tremor 12/13/2014  . Carotid arterial disease ([place]) 07/12/2014  . Decreased cardiac output 07/12/2014  . Cardiomyopathy, hypertrophic obstructive ([place]) 07/12/2014  . Head injuries 07/12/2014  . HOCM (hypertrophic obstructive cardiomyopathy) ([place])   . Encounter for therapeutic drug monitoring 11/16/2013  . Obstructive sleep apnea   . Benign prostatic hypertrophy without urinary obstruction 06/09/2012  . History of neoplasm of bladder 06/09/2012  . Head injury   .  Atherosclerosis of aorta ([place])   . Hypotension   . Stroke ([place])   . Unsteady gait   . Excessive sweating   . Nausea   . ORTHOSTATIC HYPOTENSION, HX OF 09/01/2010  . Overweight(278.02) 03/07/2009  . MITRAL REGURGITATION 03/05/2009  . Chronic atrial fibrillation ([place]) 03/05/2009  . Atrial fibrillation and flutter ([place]) 03/05/2009  . Mitral and aortic incompetence 03/05/2009    Past Medical History:  Diagnosis Date  . Atherosclerosis of aorta ([place])    by CT scan in past  . Atrial fibrillation ([place])    and aflutter. pt has a left atrial circuit that is not ablated. was on amiodarone-stopped, now use rate control.   . Bladder cancer ([place])    hx; treated with BCG in past   . Carotid artery disease ([place])    There was calcified plaque but no stenosis by carotid artery screening done at [facility] October, 2013  . Diabetes mellitus    type not specified  . Diabetic neuropathy (HCC)    feet  . Elevated liver enzymes    over time; hx  . Excessive sweating   . Glaucoma   . Gout   . Head injury    when slipped on ice 2004-2005. stablaized and back on Coumadin   . Headache    migraines - distant past  . HOCM (hypertrophic obstructive cardiomyopathy) ([place])    a. 01/2016 Echo: EF 65-70%, no rwma, LVOT gradient of 80-55mmHg, mod AS, SAM, sev dil LA, PASP 63mmHg.  [name] Homocystinemia ([place])    elevated, mild   . Hypercholesterolemia    treated.   [name]  Hypertension   . Kidney mass    laproscopic surgery woth cryoablation of a mass outside kidney followed at [facility].   . Moderate aortic stenosis    a. 01/2016 echo: mod AS.   [name] Motion sickness    ocean boats  . Nausea   . Obstructive sleep apnea    CPAP started successfully 2014  . Orthostatic hypotension    a. orthostatic. dehydration. hospitalized 11/11  . Sleep apnea    Significant obstructive sleep apnea diagnosed in August, 2012, the patient is to receive CPAP   . Stroke [facility])    "2 old strokes" CT and MRI. [place]  hospital 11/11. diagnosis was dehydration, no acutal reports.   . TSH elevation    on synthroid historically   . Unsteady gait    August, 2012    Social History   Socioeconomic History  . Marital status: Married    Spouse name: Not on file  . Number of children: Not on file  . Years of education: Not on file  . Highest education level: Not on file  Social Needs  . Financial resource strain: Not hard at all  . Food insecurity - worry: Never true  . Food insecurity - inability: Never true  . Transportation needs - medical: No  . Transportation needs - non-medical: No  Occupational History  . Not on file  Tobacco Use  . Smoking status: Former Smoker    Types: Cigars, Cigarettes    Last attempt to quit: 10/02/1974    Years since quitting: 43.1  . Smokeless tobacco: Never Used  . Tobacco comment: still smokes cigars once a day  Substance and Sexual Activity  . Alcohol use: Yes    Comment: rare - 1 beer  . Drug use: No  . Sexual activity: Yes  Other Topics Concern  . Not on file  Social History Narrative   Married, retired, gets regular exercise.     Outpatient Encounter Medications as of 10/29/2017  Medication Sig Note  . acetaminophen (TYLENOL) 325 MG tablet Take 2 tablets (650 mg total) by mouth every 6 (six) hours as needed for mild pain (or Fever >/= 101).   [name] albuterol (PROVENTIL HFA;VENTOLIN HFA) 108 (90 Base) MCG/ACT inhaler Inhale 2 puffs into the lungs every 6 (six) hours as needed for wheezing or shortness of breath.   . Calcium-Vitamin D 600-200 MG-UNIT per tablet Take 1 tablet by mouth daily.     [name] co-enzyme Q-10 30 MG capsule Take 30 mg by mouth daily.     . digoxin (LANOXIN) 0.125 MG tablet TAKE ONE TABLET BY MOUTH ONCE DAILY   . fish oil-omega-3 fatty acids 1000 MG capsule Take 2 g by mouth 2 (two) times daily.     . Gelatin Capsules, Empty, CAPS 2 capsules by Does not apply route 2 (two) times daily.    . Glucosamine 500 MG CAPS Take 1 capsule by mouth  daily.     [name] glucose blood (ONE TOUCH ULTRA TEST) test strip USE ONE STRIP TO CHECK GLUCOSE ONCE DAILY   . glucose blood test strip  06/13/2015: strips and lancets.  DX: E11.9 Received from: [employer]  . latanoprost (XALATAN) 0.005 % ophthalmic solution Place 1 drop into both eyes at bedtime.    [name] levothyroxine (SYNTHROID, LEVOTHROID) 50 MCG tablet TAKE ONE TABLET BY MOUTH ONCE DAILY   . metFORMIN (GLUCOPHAGE) 500 MG tablet TAKE ONE TABLET BY MOUTH ONCE DAILY   . ONETOUCH DELICA LANCETS 85U MISC  USE ONE LANCET TO CHECK BLOOD GLUCOSE ONCE DAILY   . POTASSIUM PO Take 1 tablet by mouth daily.     . simvastatin (ZOCOR) 40 MG tablet Take 0.5 tablets (20 mg total) by mouth at bedtime.   . verapamil (CALAN-SR) 240 MG CR tablet Take 1 tablet (240 mg total) by mouth daily.   . vitamin B-12 (CYANOCOBALAMIN) 500 MCG tablet Take 500 mcg by mouth daily.     . vitamin C (ASCORBIC ACID) 500 MG tablet Take 500 mg by mouth daily.     [name] warfarin (COUMADIN) 5 MG tablet TAKE AS DIRECTED BY  COUMADIN  CLINIC (Patient taking differently: Take 5 mg by mouth daily. TAKE AS DIRECTED BY  COUMADIN  CLINIC)   . [DISCONTINUED] guaiFENesin-dextromethorphan (ROBITUSSIN DM) 100-10 MG/5ML syrup Take 10 mLs by mouth every 6 (six) hours as needed for cough.    No facility-administered encounter medications on file as of 10/29/2017.     Allergies  Allergen Reactions  . Macrolides And Ketolides Other (See Comments)  . Mycinettes   . Nitrofuran Derivatives Other (See Comments)  . Nitrofurantoin Other (See Comments)  . Erythromycin Itching and Rash    Other reaction(s): UNKNOWN  And red skin    Review of Systems  Constitutional: Positive for malaise/fatigue (better).  Respiratory: Positive for cough (off and on) and shortness of breath (gives out of breath).   Cardiovascular: Negative.        Chest tightness  Gastrointestinal: Negative.   Musculoskeletal: Negative.   Skin: Negative.   Neurological:  Positive for dizziness (with quick movements) and weakness (better).  Endo/Heme/Allergies: Negative.   Psychiatric/Behavioral: Negative.     Objective:  BP (!) 110/52   Pulse 86   Temp 97.9 F (36.6 C)   Resp 16   Wt 187 lb 9.6 oz (85.1 kg)   SpO2 96%   BMI 27.70 kg/m   Physical Exam  Constitutional: He is oriented to person, place, and time and well-developed, well-nourished, and in no distress.  HENT:  Head: Normocephalic and atraumatic.  Eyes: Conjunctivae are normal. Pupils are equal, round, and reactive to light. No scleral icterus.  Neck: No thyromegaly present.  Cardiovascular: Normal rate, regular rhythm and intact distal pulses. Exam reveals no gallop.  Murmur heard.  Systolic murmur is present with a grade of 3/6. Pulmonary/Chest: Effort normal and breath sounds normal. No respiratory distress. He has no wheezes.  Abdominal: Soft.  Musculoskeletal: He exhibits no edema or tenderness.  Neurological: He is alert and oriented to person, place, and time. Gait normal. GCS score is 15.  Skin: Skin is warm and dry.  Psychiatric: Mood, memory, affect and judgment normal.    Assessment and Plan :  1. Community acquired pneumonia, unspecified laterality Feeling better. Re check chest xray. - DG Chest 2 View; Future  2. Chronic atrial fibrillation (HCC) Stable.  3. Type 2 diabetes mellitus without complication, without long-term current use of insulin ([place]) Check on next visit.  4. Essential (primary) hypertension Stable.  5. Heart murmur, systolic 6.Renal cell carcinoma  HPI, Exam and A&P transcribed by Tiffany Kocher, RMA under direction and in the presence of Miguel Aschoff, MD. I have done the exam and reviewed the chart and it is accurate to the best of my knowledge. Development worker, community has been used and  any errors in dictation or transcription are unintentional. Miguel Aschoff M.D. [facility]

## 2017-11-06 ENCOUNTER — Inpatient Hospital Stay: Payer: PPO | Admitting: Internal Medicine

## 2017-11-08 ENCOUNTER — Ambulatory Visit: Payer: PPO | Admitting: Pulmonary Disease

## 2017-11-08 ENCOUNTER — Encounter: Payer: Self-pay | Admitting: Pulmonary Disease

## 2017-11-08 VITALS — BP 128/68 | HR 90 | Ht 69.0 in | Wt 183.0 lb

## 2017-11-08 DIAGNOSIS — I482 Chronic atrial fibrillation, unspecified: Secondary | ICD-10-CM

## 2017-11-08 DIAGNOSIS — I421 Obstructive hypertrophic cardiomyopathy: Secondary | ICD-10-CM

## 2017-11-08 DIAGNOSIS — R918 Other nonspecific abnormal finding of lung field: Secondary | ICD-10-CM

## 2017-11-08 DIAGNOSIS — Z8701 Personal history of pneumonia (recurrent): Secondary | ICD-10-CM | POA: Diagnosis not present

## 2017-11-08 NOTE — Patient Instructions (Signed)
Recommend repeat CT scan of chest in 6 months or so.  This may be ordered by Dr. Alben Spittle office.  Follow-up as needed

## 2017-11-11 ENCOUNTER — Ambulatory Visit (INDEPENDENT_AMBULATORY_CARE_PROVIDER_SITE_OTHER): Payer: PPO | Admitting: Family Medicine

## 2017-11-11 ENCOUNTER — Other Ambulatory Visit: Payer: Self-pay

## 2017-11-11 ENCOUNTER — Ambulatory Visit (INDEPENDENT_AMBULATORY_CARE_PROVIDER_SITE_OTHER): Payer: PPO

## 2017-11-11 VITALS — BP 132/68 | HR 72 | Temp 97.5°F | Ht 69.0 in | Wt 189.2 lb

## 2017-11-11 VITALS — BP 132/68 | HR 72 | Temp 97.5°F | Resp 16 | Wt 189.0 lb

## 2017-11-11 DIAGNOSIS — I4892 Unspecified atrial flutter: Secondary | ICD-10-CM

## 2017-11-11 DIAGNOSIS — E119 Type 2 diabetes mellitus without complications: Secondary | ICD-10-CM | POA: Diagnosis not present

## 2017-11-11 DIAGNOSIS — I4891 Unspecified atrial fibrillation: Secondary | ICD-10-CM | POA: Diagnosis not present

## 2017-11-11 DIAGNOSIS — I482 Chronic atrial fibrillation, unspecified: Secondary | ICD-10-CM

## 2017-11-11 DIAGNOSIS — Z5181 Encounter for therapeutic drug level monitoring: Secondary | ICD-10-CM | POA: Diagnosis not present

## 2017-11-11 DIAGNOSIS — I10 Essential (primary) hypertension: Secondary | ICD-10-CM

## 2017-11-11 DIAGNOSIS — E7800 Pure hypercholesterolemia, unspecified: Secondary | ICD-10-CM | POA: Diagnosis not present

## 2017-11-11 DIAGNOSIS — Z0000 Encounter for general adult medical examination without abnormal findings: Secondary | ICD-10-CM

## 2017-11-11 DIAGNOSIS — R5383 Other fatigue: Secondary | ICD-10-CM

## 2017-11-11 LAB — POCT INR: INR: 2.7

## 2017-11-11 NOTE — Patient Instructions (Signed)
Please take 1/2 tablet tonight, then continue dosage of 1 tablet everyday except 1/2 tablet on SUNDAYS, [name] [name]. Recheck in 2 weeks.

## 2017-11-11 NOTE — Progress Notes (Signed)
Subjective:   [name] [name] is a 84 y.o. male who presents for Medicare Annual/Subsequent preventive examination.  Review of Systems:  N/A  Cardiac Risk Factors include: advanced age (>81men, >7 women);diabetes mellitus;hypertension;male gender     Objective:    Vitals: BP 132/68 (BP Location: Left Arm)   Pulse 72   Temp (!) 97.5 F (36.4 C) (Oral)   Ht 5\' 9"  (1.753 m)   Wt 189 lb 3.2 oz (85.8 kg)   BMI 27.94 kg/m   Body mass index is 27.94 kg/m.  Advanced Directives 11/11/2017 09/18/2017 09/18/2017 10/30/2016 10/30/2016 06/13/2015 06/13/2015  Does Patient Have a Medical Advance Directive? Yes Yes Yes Yes Yes No Yes  Type of Paramedic of [place];Living will Living will;Healthcare Power of Attorney Living will;Healthcare Power of [place];Living will Living will;Healthcare Power of Attorney - -  Does patient want to make changes to medical advance directive? - No - Patient declined No - Patient declined - - - -  Copy of [place] in Chart? Yes No - copy requested No - copy requested - No - copy requested - -  Would patient like information on creating a medical advance directive? - - - - - - -    Tobacco Social History   Tobacco Use  Smoking Status Former Smoker  . Types: Cigars, Cigarettes  . Last attempt to quit: 10/02/1974  . Years since quitting: 43.1  Smokeless Tobacco Never Used  Tobacco Comment   still smokes cigars once a day     Counseling given: Not Answered Comment: still smokes cigars once a day   Clinical Intake:  Pre-visit preparation completed: Yes  Pain : No/denies pain Pain Score: 0-No pain     Nutritional Status: BMI 25 -29 Overweight Nutritional Risks: None Diabetes: Yes, type 2 CBG done?: No Did pt. bring in CBG monitor from home?: No  How often do you need to have someone help you when you read instructions, pamphlets, or other written materials from your doctor  or pharmacy?: 1 - Never  Interpreter Needed?: No  Information entered by :: [facility], LPN  Past Medical History:  Diagnosis Date  . Atherosclerosis of aorta ([place])    by CT scan in past  . Atrial fibrillation ([place])    and aflutter. pt has a left atrial circuit that is not ablated. was on amiodarone-stopped, now use rate control.   . Bladder cancer ([place])    hx; treated with BCG in past   . Carotid artery disease ([place])    There was calcified plaque but no stenosis by carotid artery screening done at [facility] October, 2013  . Diabetes mellitus    type not specified  . Diabetic neuropathy (H[facility])    feet  . Elevated liver enzymes    over time; hx  . Excessive sweating   . Glaucoma   . Gout   . Head injury    when slipped on ice 2004-2005. stablaized and back on Coumadin   . Headache    migraines - distant past  . HOCM (hypertrophic obstructive cardiomyopathy) ([place])    a. 01/2016 Echo: EF 65-70%, no rwma, LVOT gradient of 80-29mmHg, mod AS, SAM, sev dil LA, PASP 51mmHg.  [name] Homocystinemia ([place])    elevated, mild   . Hypercholesterolemia    treated.   . Hypertension   . Kidney mass    laproscopic surgery woth cryoablation of a mass outside kidney followed at  [facility].   . Moderate aortic stenosis    a. 01/2016 echo: mod AS.   [name] Motion sickness    ocean boats  . Nausea   . Obstructive sleep apnea    CPAP started successfully 2014  . Orthostatic hypotension    a. orthostatic. dehydration. hospitalized 11/11  . Sleep apnea    Significant obstructive sleep apnea diagnosed in August, 2012, the patient is to receive CPAP   . Stroke [facility])    "2 old strokes" CT and MRI. [facility] 11/11. diagnosis was dehydration, no acutal reports.   . TSH elevation    on synthroid historically   . Unsteady gait    August, 2012   Past Surgical History:  Procedure Laterality Date  . BLADDER SURGERY    . CATARACT EXTRACTION W/PHACO Left 05/11/2015   Procedure: CATARACT  EXTRACTION PHACO AND INTRAOCULAR LENS PLACEMENT (IOC);  Surgeon: Leandrew Koyanagi, MD;  Location: [facility];  Service: Ophthalmology;  Laterality: Left;  DIABETIC - oral meds, CPAP  . KIDNEY SURGERY     "froze mass"  . TONSILLECTOMY     Family History  Problem Relation Age of Onset  . Arrhythmia Father        A-Fib  . Arrhythmia Brother        A-Fib  . Heart attack Neg Hx   . Hypertension Neg Hx   . Stroke Neg Hx    Social History   Socioeconomic History  . Marital status: Married    Spouse name: None  . Number of children: 1  . Years of education: None  . Highest education level: Bachelor's degree (e.g., BA, AB, BS)  Social Needs  . Financial resource strain: Not hard at all  . Food insecurity - worry: Never true  . Food insecurity - inability: Never true  . Transportation needs - medical: No  . Transportation needs - non-medical: No  Occupational History  . Occupation: retired  Tobacco Use  . Smoking status: Former Smoker    Types: Cigars, Cigarettes    Last attempt to quit: 10/02/1974    Years since quitting: 43.1  . Smokeless tobacco: Never Used  . Tobacco comment: still smokes cigars once a day  Substance and Sexual Activity  . Alcohol use: No    Frequency: Never  . Drug use: No  . Sexual activity: Yes  Other Topics Concern  . None  Social History Narrative   Married, retired, gets regular exercise.     Outpatient Encounter Medications as of 11/11/2017  Medication Sig  . acetaminophen (TYLENOL) 325 MG tablet Take 2 tablets (650 mg total) by mouth every 6 (six) hours as needed for mild pain (or Fever >/= 101).  [name] albuterol (PROVENTIL HFA;VENTOLIN HFA) 108 (90 Base) MCG/ACT inhaler Inhale 2 puffs into the lungs every 6 (six) hours as needed for wheezing or shortness of breath.  . Calcium-Vitamin D 600-200 MG-UNIT per tablet Take 1 tablet by mouth daily.    [name] co-enzyme Q-10 30 MG capsule Take 30 mg by mouth daily.    . digoxin (LANOXIN) 0.125 MG  tablet TAKE ONE TABLET BY MOUTH ONCE DAILY  . fish oil-omega-3 fatty acids 1000 MG capsule Take 2 g by mouth 2 (two) times daily.    . Gelatin Capsules, Empty, CAPS 2 capsules by Does not apply route 2 (two) times daily.   . Glucosamine 500 MG CAPS Take 1 capsule by mouth daily.    [name] glucose blood (ONE TOUCH ULTRA TEST) test strip  USE ONE STRIP TO CHECK GLUCOSE ONCE DAILY  . latanoprost (XALATAN) 0.005 % ophthalmic solution Place 1 drop into both eyes at bedtime.   [name] levothyroxine (SYNTHROID, LEVOTHROID) 50 MCG tablet TAKE ONE TABLET BY MOUTH ONCE DAILY  . metFORMIN (GLUCOPHAGE) 500 MG tablet TAKE ONE TABLET BY MOUTH ONCE DAILY  . ONETOUCH DELICA LANCETS 71G MISC USE ONE LANCET TO CHECK BLOOD GLUCOSE ONCE DAILY  . POTASSIUM PO Take 1 tablet by mouth daily.    . simvastatin (ZOCOR) 40 MG tablet Take 0.5 tablets (20 mg total) by mouth at bedtime.  . verapamil (CALAN-SR) 240 MG CR tablet Take 1 tablet (240 mg total) by mouth daily.  . vitamin B-12 (CYANOCOBALAMIN) 500 MCG tablet Take 500 mcg by mouth daily.    [name] warfarin (COUMADIN) 5 MG tablet TAKE AS DIRECTED BY  COUMADIN  CLINIC (Patient taking differently: Take 5 mg by mouth daily. TAKE AS DIRECTED BY  COUMADIN  CLINIC)   No facility-administered encounter medications on file as of 11/11/2017.     Activities of Daily Living In your present state of health, do you have any difficulty performing the following activities: 11/11/2017 09/18/2017  Hearing? Tempie Donning  Comment Pt wears bilateral hearing aids.  -  Vision? N N  Difficulty concentrating or making decisions? N N  Walking or climbing stairs? N N  Dressing or bathing? N N  Doing errands, shopping? N N  Preparing Food and eating ? N -  Using the Toilet? N -  In the past six months, have you accidently leaked urine? N -  Do you have problems with loss of bowel control? N -  Managing your Medications? N -  Managing your Finances? N -  Housekeeping or managing your Housekeeping? N -  Some  recent data might be hidden    Patient Care Team: [name], MD as PCP - General (Unknown Physician Specialty) Margaretha Sheffield, MD (Otolaryngology) Leandrew Koyanagi, MD as Referring Physician (Ophthalmology) Wellington Hampshire, MD as Consulting Physician (Cardiology) Sharlotte Alamo, DPM as Consulting Physician (Podiatry) Bernardo Heater, Ronda Fairly, MD as Consulting Physician (Urology) Carmon Ginsberg, [place] as Referring Physician (Family Medicine) Wilhelmina Mcardle, MD as Consulting Physician (Pulmonary Disease) Jannet Mantis, MD as Consulting Physician (Dermatology)   Assessment:   This is a routine wellness examination for [name] [name].  Exercise Activities and Dietary recommendations Current Exercise Habits: The patient does not participate in regular exercise at present, Intensity: Mild, Exercise limited by: None identified(Pt states he will start walking when the weather gets warmer. )  Goals    . DIET - INCREASE WATER INTAKE     Recommend increasing water intake to 6-8 glasses a day and cutting back on diet sodas.        Fall Risk Fall Risk  11/11/2017 10/30/2016 06/13/2015  Falls in the past year? Yes No Yes  Number falls in past yr: 1 - 2 or more  Injury with Fall? No - No  Comment bruising - -  Risk Factor Category  - - High Fall Risk  Follow up Falls prevention discussed - -   Is the patient's home free of loose throw rugs in walkways, pet beds, electrical cords, etc?   yes      Grab bars in the bathroom? no, pt to place one in shower soon      Handrails on the stairs?   yes      Adequate lighting?   yes  Timed Get Up and Go  Performed: N/A  Depression Screen PHQ 2/9 Scores 11/11/2017 11/11/2017 10/30/2016 06/13/2015  PHQ - 2 Score 0 0 0 0  PHQ- 9 Score 0 - - -    Cognitive Function     6CIT Screen 11/11/2017 10/30/2016  What Year? 0 points 0 points  What month? 0 points 0 points  What time? 0 points 0 points  Count back from 20 0 points 0 points  Months in reverse  2 points 0 points  Repeat phrase 2 points 0 points  Total Score 4 0    Immunization History  Administered Date(s) Administered  . Influenza, High Dose Seasonal PF 07/17/2017  . Influenza-Unspecified 08/15/2013  . Pneumococcal Conjugate-13 04/08/2014  . Pneumococcal Polysaccharide-23 08/07/2003  . Td 11/23/2009  . Zoster 12/27/2008    Qualifies for Shingles Vaccine? Due for Shingles vaccine. Declined my offer to administer today. Education has been provided regarding the importance of this vaccine. Pt has been advised to call her insurance company to determine her out of pocket expense. Advised she may also receive this vaccine at her local pharmacy or Health Dept. Verbalized acceptance and understanding.  Screening Tests Health Maintenance  Topic Date Due  . FOOT EXAM  08/30/2017  . URINE MICROALBUMIN  10/30/2017  . HEMOGLOBIN A1C  02/03/2018  . OPHTHALMOLOGY EXAM  06/24/2018  . TETANUS/TDAP  11/24/2019  . INFLUENZA VACCINE  Completed  . PNA vac Low Risk Adult  Completed   Cancer Screenings: Lung: Low Dose CT Chest recommended if Age 41-80 years, 30 pack-year currently smoking OR have quit w/in 15years. Patient does not qualify. Colorectal: N/A  Additional Screenings:  Hepatitis B/HIV/Syphillis: Pt declines today.  Hepatitis C Screening: Pt declines today.     Plan:  I have personally reviewed and addressed the Medicare Annual Wellness questionnaire and have noted the following in the patient's chart:  A. Medical and social history B. Use of alcohol, tobacco or illicit drugs  C. Current medications and supplements D. Functional ability and status E.  Nutritional status F.  Physical activity G. Advance directives H. List of other physicians I.  Hospitalizations, surgeries, and ER visits in previous 12 months J.  [place] such as hearing and vision if needed, cognitive and depression L. Referrals and appointments - none  In addition, I have reviewed and  discussed with patient certain preventive protocols, quality metrics, and best practice recommendations. A written personalized care plan for preventive services as well as general preventive health recommendations were provided to patient.  See attached scanned questionnaire for additional information.   Signed,  Fabio Neighbors, LPN Nurse Health Advisor   Nurse Recommendations: Pt needs a urine microalbumin and diabetic foot exam.

## 2017-11-11 NOTE — Patient Instructions (Signed)
[name] [name] , Thank you for taking time to come for your Medicare Wellness Visit. I appreciate your ongoing commitment to your health goals. Please review the following plan we discussed and let me know if I can assist you in the future.   Screening recommendations/referrals: Colonoscopy: No longer required Recommended yearly ophthalmology/optometry visit for glaucoma screening and checkup Recommended yearly dental visit for hygiene and checkup  Vaccinations: Influenza vaccine: Up to date Pneumococcal vaccine: Up to date Tdap vaccine: Up to date Shingles vaccine: Pt received Zostavax in 2010. Will check on insurance first before receiving the Shingrix.     Advanced directives: Already on file.  Conditions/risks identified: Recommend increasing water intake to 6-8 glasses a day and cutting back on diet sodas.   Next appointment: 1:30 PM today  Preventive Care 65 Years and Older, Male Preventive care refers to lifestyle choices and visits with your health care provider that can promote health and wellness. What does preventive care include?  A yearly physical exam. This is also called an annual well check.  Dental exams once or twice a year.  Routine eye exams. Ask your health care provider how often you should have your eyes checked.  Personal lifestyle choices, including:  Daily care of your teeth and gums.  Regular physical activity.  Eating a healthy diet.  Avoiding tobacco and drug use.  Limiting alcohol use.  Practicing safe sex.  Taking low doses of aspirin every day.  Taking vitamin and mineral supplements as recommended by your health care provider. What happens during an annual well check? The services and screenings done by your health care provider during your annual well check will depend on your age, overall health, lifestyle risk factors, and family history of disease. Counseling  Your health care provider may ask you questions about your:  Alcohol  use.  Tobacco use.  Drug use.  Emotional well-being.  Home and relationship well-being.  Sexual activity.  Eating habits.  History of falls.  Memory and ability to understand (cognition).  Work and work Statistician. Screening  You may have the following tests or measurements:  Height, weight, and BMI.  Blood pressure.  Lipid and cholesterol levels. These may be checked every 5 years, or more frequently if you are over 82 years old.  Skin check.  Lung cancer screening. You may have this screening every year starting at age 82 if you have a 30-pack-year history of smoking and currently smoke or have quit within the past 15 years.  Fecal occult blood test (FOBT) of the stool. You may have this test every year starting at age 82.  Flexible sigmoidoscopy or colonoscopy. You may have a sigmoidoscopy every 5 years or a colonoscopy every 10 years starting at age 82.  Prostate cancer screening. Recommendations will vary depending on your family history and other risks.  Hepatitis C blood test.  Hepatitis B blood test.  Sexually transmitted disease (STD) testing.  Diabetes screening. This is done by checking your blood sugar (glucose) after you have not eaten for a while (fasting). You may have this done every 1-3 years.  Abdominal aortic aneurysm (AAA) screening. You may need this if you are a current or former smoker.  Osteoporosis. You may be screened starting at age 82 if you are at high risk. Talk with your health care provider about your test results, treatment options, and if necessary, the need for more tests. Vaccines  Your health care provider may recommend certain vaccines, such as:  Influenza vaccine.  This is recommended every year.  Tetanus, diphtheria, and acellular pertussis (Tdap, Td) vaccine. You may need a Td booster every 10 years.  Zoster vaccine. You may need this after age 82.  Pneumococcal 13-valent conjugate (PCV13) vaccine. One dose is  recommended after age 82.  Pneumococcal polysaccharide (PPSV23) vaccine. One dose is recommended after age 82. Talk to your health care provider about which screenings and vaccines you need and how often you need them. This information is not intended to replace advice given to you by your health care provider. Make sure you discuss any questions you have with your health care provider. Document Released: 10/28/2015 Document Revised: 06/20/2016 Document Reviewed: 08/02/2015 Elsevier Interactive Patient Education  2017 [place] Prevention in the Home Falls can cause injuries. They can happen to people of all ages. There are many things you can do to make your home safe and to help prevent falls. What can I do on the outside of my home?  Regularly fix the edges of walkways and driveways and fix any cracks.  Remove anything that might make you trip as you walk through a door, such as a raised step or threshold.  Trim any bushes or trees on the path to your home.  Use bright outdoor lighting.  Clear any walking paths of anything that might make someone trip, such as rocks or tools.  Regularly check to see if handrails are loose or broken. Make sure that both sides of any steps have handrails.  Any raised decks and porches should have guardrails on the edges.  Have any leaves, snow, or ice cleared regularly.  Use sand or salt on walking paths during winter.  Clean up any spills in your garage right away. This includes oil or grease spills. What can I do in the bathroom?  Use night lights.  Install grab bars by the toilet and in the tub and shower. Do not use towel bars as grab bars.  Use non-skid mats or decals in the tub or shower.  If you need to sit down in the shower, use a plastic, non-slip stool.  Keep the floor dry. Clean up any water that spills on the floor as soon as it happens.  Remove soap buildup in the tub or shower regularly.  Attach bath mats  securely with double-sided non-slip rug tape.  Do not have throw rugs and other things on the floor that can make you trip. What can I do in the bedroom?  Use night lights.  Make sure that you have a light by your bed that is easy to reach.  Do not use any sheets or blankets that are too big for your bed. They should not hang down onto the floor.  Have a firm chair that has side arms. You can use this for support while you get dressed.  Do not have throw rugs and other things on the floor that can make you trip. What can I do in the kitchen?  Clean up any spills right away.  Avoid walking on wet floors.  Keep items that you use a lot in easy-to-reach places.  If you need to reach something above you, use a strong step stool that has a grab bar.  Keep electrical cords out of the way.  Do not use floor polish or wax that makes floors slippery. If you must use wax, use non-skid floor wax.  Do not have throw rugs and other things on the floor that can make  you trip. What can I do with my stairs?  Do not leave any items on the stairs.  Make sure that there are handrails on both sides of the stairs and use them. Fix handrails that are broken or loose. Make sure that handrails are as long as the stairways.  Check any carpeting to make sure that it is firmly attached to the stairs. Fix any carpet that is loose or worn.  Avoid having throw rugs at the top or bottom of the stairs. If you do have throw rugs, attach them to the floor with carpet tape.  Make sure that you have a light switch at the top of the stairs and the bottom of the stairs. If you do not have them, ask someone to add them for you. What else can I do to help prevent falls?  Wear shoes that:  Do not have high heels.  Have rubber bottoms.  Are comfortable and fit you well.  Are closed at the toe. Do not wear sandals.  If you use a stepladder:  Make sure that it is fully opened. Do not climb a closed  stepladder.  Make sure that both sides of the stepladder are locked into place.  Ask someone to hold it for you, if possible.  Clearly mark and make sure that you can see:  Any grab bars or handrails.  First and last steps.  Where the edge of each step is.  Use tools that help you move around (mobility aids) if they are needed. These include:  Canes.  Walkers.  Scooters.  Crutches.  Turn on the lights when you go into a dark area. Replace any light bulbs as soon as they burn out.  Set up your furniture so you have a clear path. Avoid moving your furniture around.  If any of your floors are uneven, fix them.  If there are any pets around you, be aware of where they are.  Review your medicines with your doctor. Some medicines can make you feel dizzy. This can increase your chance of falling. Ask your doctor what other things that you can do to help prevent falls. This information is not intended to replace advice given to you by your health care provider. Make sure you discuss any questions you have with your health care provider. Document Released: 07/28/2009 Document Revised: 03/08/2016 Document Reviewed: 11/05/2014 Elsevier Interactive Patient Education  2017 [employer].

## 2017-11-11 NOTE — Progress Notes (Signed)
[name] [name]  MRN: [id number] DOB: 18-Dec-1933  Subjective:  HPI   The patient is an 84 year old male who presents for follow up of chronic illness.  He was last seen 10/29/17.    Hypertension BP Readings from Last 3 Encounters:  11/11/17 132/68  11/11/17 132/68  11/08/17 128/68    Diabetes Patient's last A1C was done on 08/05/17 and it was 6.5.  He checks his glucose at home and his readings have been running about 130-150's.    The patient and his wife would like to discuss the visit the patient had with pulmonology.  They said that he was telling them that he was not sure he had pneumonia and thinks it may have been CHF.  He also said that we will need to order a follow up CT in 6 months to check the nodules in his lungs. Clinically improving.   Patient Active Problem List   Diagnosis Date Noted  . Heart murmur, systolic 28/41/3244  . H/O: rheumatic fever 03/21/2017  . Tinnitus 10/30/2016  . On warfarin therapy 07/06/2015  . Arteriosclerosis of coronary artery 06/13/2015  . Carcinoma in situ of bladder 06/13/2015  . Diabetes mellitus, type 2 ([place]) 06/13/2015  . Diabetic neuropathy ([place]) 06/13/2015  . Dizziness 06/13/2015  . Essential (primary) hypertension 06/13/2015  . Fatigue 06/13/2015  . Glaucoma 06/13/2015  . Hypercholesteremia 06/13/2015  . Male hypogonadism 06/13/2015  . Adult hypothyroidism 06/13/2015  . Arthritis, degenerative 06/13/2015  . CAP (community acquired pneumonia) 06/13/2015  . Adenocarcinoma, renal cell ([place]) 06/13/2015  . Benign head tremor 12/13/2014  . Carotid arterial disease ([place]) 07/12/2014  . Decreased cardiac output 07/12/2014  . Cardiomyopathy, hypertrophic obstructive ([place]) 07/12/2014  . Head injuries 07/12/2014  . HOCM (hypertrophic obstructive cardiomyopathy) ([place])   . Encounter for therapeutic drug monitoring 11/16/2013  . Obstructive sleep apnea   . Benign prostatic hypertrophy without urinary obstruction 06/09/2012  .  History of neoplasm of bladder 06/09/2012  . Head injury   . Atherosclerosis of aorta ([place])   . Hypotension   . Stroke ([place])   . Unsteady gait   . Excessive sweating   . Nausea   . ORTHOSTATIC HYPOTENSION, HX OF 09/01/2010  . Overweight(278.02) 03/07/2009  . MITRAL REGURGITATION 03/05/2009  . Chronic atrial fibrillation ([place]) 03/05/2009  . Atrial fibrillation and flutter ([place]) 03/05/2009  . Mitral and aortic incompetence 03/05/2009    Past Medical History:  Diagnosis Date  . Atherosclerosis of aorta ([place])    by CT scan in past  . Atrial fibrillation ([place])    and aflutter. pt has a left atrial circuit that is not ablated. was on amiodarone-stopped, now use rate control.   . Bladder cancer ([place])    hx; treated with BCG in past   . Carotid artery disease ([place])    There was calcified plaque but no stenosis by carotid artery screening done at [facility] October, 2013  . Diabetes mellitus    type not specified  . Diabetic neuropathy (HCC)    feet  . Elevated liver enzymes    over time; hx  . Excessive sweating   . Glaucoma   . Gout   . Head injury    when slipped on ice 2004-2005. stablaized and back on Coumadin   . Headache    migraines - distant past  . HOCM (hypertrophic obstructive cardiomyopathy) ([place])    a. 01/2016 Echo: EF 65-70%, no rwma, LVOT gradient of 80-78mmHg, mod AS, SAM, sev  dil LA, PASP 3mmHg.  [name] Homocystinemia ([place])    elevated, mild   . Hypercholesterolemia    treated.   . Hypertension   . Kidney mass    laproscopic surgery woth cryoablation of a mass outside kidney followed at [facility].   . Moderate aortic stenosis    a. 01/2016 echo: mod AS.   [name] Motion sickness    ocean boats  . Nausea   . Obstructive sleep apnea    CPAP started successfully 2014  . Orthostatic hypotension    a. orthostatic. dehydration. hospitalized 11/11  . Sleep apnea    Significant obstructive sleep apnea diagnosed in August, 2012, the patient is to receive CPAP    . Stroke [facility])    "2 old strokes" CT and MRI. [facility] 11/11. diagnosis was dehydration, no acutal reports.   . TSH elevation    on synthroid historically   . Unsteady gait    August, 2012    Social History   Socioeconomic History  . Marital status: Married    Spouse name: Not on file  . Number of children: 1  . Years of education: Not on file  . Highest education level: Bachelor's degree (e.g., BA, AB, BS)  Social Needs  . Financial resource strain: Not hard at all  . Food insecurity - worry: Never true  . Food insecurity - inability: Never true  . Transportation needs - medical: No  . Transportation needs - non-medical: No  Occupational History  . Occupation: retired  Tobacco Use  . Smoking status: Former Smoker    Types: Cigars, Cigarettes    Last attempt to quit: 10/02/1974    Years since quitting: 43.1  . Smokeless tobacco: Never Used  . Tobacco comment: still smokes cigars once a day  Substance and Sexual Activity  . Alcohol use: No    Frequency: Never  . Drug use: No  . Sexual activity: Yes  Other Topics Concern  . Not on file  Social History Narrative   Married, retired, gets regular exercise.     Outpatient Encounter Medications as of 11/11/2017  Medication Sig  . acetaminophen (TYLENOL) 325 MG tablet Take 2 tablets (650 mg total) by mouth every 6 (six) hours as needed for mild pain (or Fever >/= 101).  [name] albuterol (PROVENTIL HFA;VENTOLIN HFA) 108 (90 Base) MCG/ACT inhaler Inhale 2 puffs into the lungs every 6 (six) hours as needed for wheezing or shortness of breath.  . Calcium-Vitamin D 600-200 MG-UNIT per tablet Take 1 tablet by mouth daily.    [name] co-enzyme Q-10 30 MG capsule Take 30 mg by mouth daily.    . digoxin (LANOXIN) 0.125 MG tablet TAKE ONE TABLET BY MOUTH ONCE DAILY  . fish oil-omega-3 fatty acids 1000 MG capsule Take 2 g by mouth 2 (two) times daily.    . Gelatin Capsules, Empty, CAPS 2 capsules by Does not apply route 2 (two) times  daily.   . Glucosamine 500 MG CAPS Take 1 capsule by mouth daily.    [name] glucose blood (ONE TOUCH ULTRA TEST) test strip USE ONE STRIP TO CHECK GLUCOSE ONCE DAILY  . latanoprost (XALATAN) 0.005 % ophthalmic solution Place 1 drop into both eyes at bedtime.   [name] levothyroxine (SYNTHROID, LEVOTHROID) 50 MCG tablet TAKE ONE TABLET BY MOUTH ONCE DAILY  . metFORMIN (GLUCOPHAGE) 500 MG tablet TAKE ONE TABLET BY MOUTH ONCE DAILY  . ONETOUCH DELICA LANCETS 53G MISC USE ONE LANCET TO CHECK BLOOD GLUCOSE ONCE DAILY  .  POTASSIUM PO Take 1 tablet by mouth daily.    . simvastatin (ZOCOR) 40 MG tablet Take 0.5 tablets (20 mg total) by mouth at bedtime.  . verapamil (CALAN-SR) 240 MG CR tablet Take 1 tablet (240 mg total) by mouth daily.  . vitamin B-12 (CYANOCOBALAMIN) 500 MCG tablet Take 500 mcg by mouth daily.    [name] warfarin (COUMADIN) 5 MG tablet TAKE AS DIRECTED BY  COUMADIN  CLINIC (Patient taking differently: Take 5 mg by mouth daily. TAKE AS DIRECTED BY  COUMADIN  CLINIC)   No facility-administered encounter medications on file as of 11/11/2017.     Allergies  Allergen Reactions  . Macrolides And Ketolides Other (See Comments)  . Mycinettes   . Nitrofuran Derivatives Other (See Comments)  . Nitrofurantoin Other (See Comments)  . Erythromycin Itching and Rash    Other reaction(s): UNKNOWN  And red skin    Review of Systems  Constitutional: Negative.   HENT: Negative.   Eyes: Negative.   Respiratory: Negative.   Cardiovascular: Negative.   Gastrointestinal: Negative.   Genitourinary: Negative.   Musculoskeletal: Negative.   Skin: Negative.   Neurological: Negative.   Endo/Heme/Allergies: Negative.   Psychiatric/Behavioral: Negative.      MMSE - Mini Mental State Exam 11/11/2017  Orientation to time 4  Orientation to Place 5  Registration 3  Attention/ Calculation 5  Recall 0  Language- name 2 objects 2  Language- repeat 1  Language- follow 3 step command 3  Language- read & follow  direction 1  Write a sentence 1  Copy design 1  Total score 26     Objective:  BP 132/68   Pulse 72   Temp (!) 97.5 F (36.4 C) (Oral)   Resp 16   Wt 189 lb (85.7 kg)   BMI 27.91 kg/m   Physical Exam  Constitutional: He is oriented to person, place, and time and well-developed, well-nourished, and in no distress.  HENT:  Head: Normocephalic and atraumatic.  Right Ear: External ear normal.  Left Ear: External ear normal.  Nose: Nose normal.  Mouth/Throat: Oropharynx is clear and moist.  Eyes: Conjunctivae and EOM are normal. Pupils are equal, round, and reactive to light.  Neck: Normal range of motion. Neck supple.  Cardiovascular: Normal rate, regular rhythm, normal heart sounds and intact distal pulses.  Pulmonary/Chest: Effort normal and breath sounds normal.  Abdominal: Soft. Bowel sounds are normal.  Genitourinary: Rectum normal, prostate normal and penis normal.  Musculoskeletal: Normal range of motion.  Neurological: He is alert and oriented to person, place, and time. GCS score is 15.  Skin: Skin is warm and dry.  Psychiatric: Mood, memory, affect and judgment normal.    Assessment and Plan :  1. Type 2 diabetes mellitus without complication, without long-term current use of insulin (HCC)  - Comprehensive metabolic panel - HgB O3J  2. Essential (primary) hypertension  - Comprehensive metabolic panel  3. Chronic atrial fibrillation (HCC)  - CBC with Differential/Platelet  4. Hypercholesteremia  - Comprehensive metabolic panel - Lipid Panel With LDL/HDL Ratio  5. Other fatigue  - CBC with Differential/Platelet - TSH 6.h/o Renal Cell Carcinoma 7.COPD vs CHF 8.Pulmonary Nodules  Clinically pt recovering./improving.  I have done the exam and reviewed the chart and it is accurate to the best of my knowledge. Development worker, community has been used and  any errors in dictation or transcription are unintentional. Miguel Aschoff M.D. [facility]

## 2017-11-12 DIAGNOSIS — I482 Chronic atrial fibrillation: Secondary | ICD-10-CM | POA: Diagnosis not present

## 2017-11-12 DIAGNOSIS — I10 Essential (primary) hypertension: Secondary | ICD-10-CM | POA: Diagnosis not present

## 2017-11-12 DIAGNOSIS — R5383 Other fatigue: Secondary | ICD-10-CM | POA: Diagnosis not present

## 2017-11-12 DIAGNOSIS — E119 Type 2 diabetes mellitus without complications: Secondary | ICD-10-CM | POA: Diagnosis not present

## 2017-11-12 DIAGNOSIS — E7800 Pure hypercholesterolemia, unspecified: Secondary | ICD-10-CM | POA: Diagnosis not present

## 2017-11-13 LAB — COMPREHENSIVE METABOLIC PANEL
A/G RATIO: 1.2 (ref 1.2–2.2)
ALK PHOS: 68 IU/L (ref 39–117)
ALT: 23 IU/L (ref 0–44)
AST: 35 IU/L (ref 0–40)
Albumin: 4.3 g/dL (ref 3.5–4.7)
BILIRUBIN TOTAL: 0.7 mg/dL (ref 0.0–1.2)
BUN / CREAT RATIO: 17 (ref 10–24)
BUN: 17 mg/dL (ref 8–27)
CHLORIDE: 101 mmol/L (ref 96–106)
CO2: 22 mmol/L (ref 20–29)
Calcium: 9.6 mg/dL (ref 8.6–10.2)
Creatinine, Ser: 1 mg/dL (ref 0.76–1.27)
GFR calc Af Amer: 81 mL/min/{1.73_m2} (ref >59)
GFR calc non Af Amer: 70 mL/min/{1.73_m2} (ref >59)
GLUCOSE: 130 mg/dL — AB (ref 65–99)
Globulin, Total: 3.6 g/dL (ref 1.5–4.5)
POTASSIUM: 4.6 mmol/L (ref 3.5–5.2)
Sodium: 142 mmol/L (ref 134–144)
Total Protein: 7.9 g/dL (ref 6.0–8.5)

## 2017-11-13 LAB — CBC WITH DIFFERENTIAL/PLATELET
BASOS ABS: 0.1 10*3/uL (ref 0.0–0.2)
Basos: 1 %
EOS (ABSOLUTE): 0.4 10*3/uL (ref 0.0–0.4)
Eos: 5 %
Hematocrit: 39 % (ref 37.5–51.0)
Hemoglobin: 13.1 g/dL (ref 13.0–17.7)
Immature Grans (Abs): 0 10*3/uL (ref 0.0–0.1)
Immature Granulocytes: 0 %
LYMPHS ABS: 2.7 10*3/uL (ref 0.7–3.1)
Lymphs: 36 %
MCH: 30.3 pg (ref 26.6–33.0)
MCHC: 33.6 g/dL (ref 31.5–35.7)
MCV: 90 fL (ref 79–97)
MONOS ABS: 0.7 10*3/uL (ref 0.1–0.9)
Monocytes: 9 %
NEUTROS ABS: 3.7 10*3/uL (ref 1.4–7.0)
Neutrophils: 49 %
PLATELETS: 232 10*3/uL (ref 150–379)
RBC: 4.33 x10E6/uL (ref 4.14–5.80)
RDW: 15.1 % (ref 12.3–15.4)
WBC: 7.5 10*3/uL (ref 3.4–10.8)

## 2017-11-13 LAB — LIPID PANEL WITH LDL/HDL RATIO
Cholesterol, Total: 147 mg/dL (ref 100–199)
HDL: 41 mg/dL (ref >39)
LDL Calculated: 82 mg/dL (ref 0–99)
LDL/HDL RATIO: 2 ratio (ref 0.0–3.6)
TRIGLYCERIDES: 120 mg/dL (ref 0–149)
VLDL Cholesterol Cal: 24 mg/dL (ref 5–40)

## 2017-11-13 LAB — HEMOGLOBIN A1C
Est. average glucose Bld gHb Est-mCnc: 134 mg/dL
HEMOGLOBIN A1C: 6.3 % — AB (ref 4.8–5.6)

## 2017-11-13 LAB — TSH: TSH: 4.85 u[IU]/mL — AB (ref 0.450–4.500)

## 2017-11-14 ENCOUNTER — Encounter: Payer: Self-pay | Admitting: Cardiovascular Disease

## 2017-11-14 ENCOUNTER — Ambulatory Visit: Payer: PPO | Admitting: Cardiovascular Disease

## 2017-11-14 VITALS — BP 124/60 | HR 93 | Ht 69.0 in | Wt 197.5 lb

## 2017-11-14 DIAGNOSIS — I421 Obstructive hypertrophic cardiomyopathy: Secondary | ICD-10-CM | POA: Diagnosis not present

## 2017-11-14 DIAGNOSIS — I482 Chronic atrial fibrillation, unspecified: Secondary | ICD-10-CM

## 2017-11-14 DIAGNOSIS — R0602 Shortness of breath: Secondary | ICD-10-CM | POA: Diagnosis not present

## 2017-11-14 DIAGNOSIS — I359 Nonrheumatic aortic valve disorder, unspecified: Secondary | ICD-10-CM | POA: Diagnosis not present

## 2017-11-14 MED ORDER — METOPROLOL SUCCINATE ER 50 MG PO TB24: 50.0000 mg | ORAL_TABLET | Freq: Every day | ORAL | 3 refills | Status: DC

## 2017-11-14 MED ORDER — METOPROLOL TARTRATE 50 MG PO TABS: 50.0000 mg | ORAL_TABLET | Freq: Two times a day (BID) | ORAL | 3 refills | Status: DC

## 2017-11-14 NOTE — Progress Notes (Signed)
Cardiology Office Note   Date:  11/14/2017   ID:  [name], [name] May 16, 1934, MRN [id number]  PCP:  [name] [name]., MD  Cardiologist:   [name] Sacramento, MD   Chief Complaint  Patient presents with  . other    12 month follow up. Patient c/o SOB when walking long distance. Patient denies chest pain. Meds reviewed verbally with patient.       History of Present Illness: [name] [name] is a 84 y.o. male who presents for for a follow-up visit regarding chronic atrial fibrillation, hypertrophic obstructive cardiomyopathy and aortic stenosis.   He is active. He volunteers regularly at [name].  Echocardiogram in April 2017 showed an ejection fraction of 65-70%, significant LVOT obstruction with a peak gradient of 80-85 mmHg and moderate aortic stenosis. Left atrium was severely dilated with moderate pulmonary hypertension. There was mild mitral stenosis. He was hospitalized in December with pneumonia.  He improved with antibiotics.  I reviewed his chest x-ray and he had bilateral infiltrates with underlying heart failure cannot be completely excluded.  Nonetheless, his symptoms included productive cough and leukocytosis and was more consistent with pneumonia.  Repeat chest x-ray after hospitalization was back to normal. He had issues with tachycardia and hypotension while hospitalized. He feels better overall although he has not recovered completely.  No chest pain.  Past Medical History:  Diagnosis Date  . Atherosclerosis of aorta ([place])    by CT scan in past  . Atrial fibrillation ([name] [name])    and aflutter. pt has a left atrial circuit that is not ablated. was on amiodarone-stopped, now use rate control.   . Bladder cancer ([name] [name])    hx; treated with BCG in past   . Carotid artery disease ([name])    There was calcified plaque but no stenosis by carotid artery screening done at [facility] October, 2013  . Diabetes mellitus    type not specified  .  Diabetic neuropathy (HCC)    feet  . Elevated liver enzymes    over time; hx  . Excessive sweating   . Glaucoma   . Gout   . Head injury    when slipped on ice 2004-2005. stablaized and back on Coumadin   . Headache    migraines - distant past  . HOCM (hypertrophic obstructive cardiomyopathy) ([place])    a. 01/2016 Echo: EF 65-70%, no rwma, LVOT gradient of 80-91mmHg, mod AS, SAM, sev dil LA, PASP 71mmHg.  [name] [name] Homocystinemia ([place])    elevated, mild   . Hypercholesterolemia    treated.   . Hypertension   . Kidney mass    laproscopic surgery woth cryoablation of a mass outside kidney followed at [name].   . Moderate aortic stenosis    a. 01/2016 echo: mod AS.   [name] [name] Motion sickness    ocean boats  . Nausea   . Obstructive sleep apnea    CPAP started successfully 2014  . Orthostatic hypotension    a. orthostatic. dehydration. hospitalized 11/11  . Sleep apnea    Significant obstructive sleep apnea diagnosed in August, 2012, the patient is to receive CPAP   . Stroke [name] [name])    "2 old strokes" CT and MRI. [name] [name] 11/11. diagnosis was dehydration, no acutal reports.   . TSH elevation    on synthroid historically   . Unsteady gait    August, 2012    Past Surgical History:  Procedure Laterality Date  . BLADDER SURGERY    .  CATARACT EXTRACTION W/PHACO Left 05/11/2015   Procedure: CATARACT EXTRACTION PHACO AND INTRAOCULAR LENS PLACEMENT (IOC);  Surgeon: [name] Koyanagi, MD;  Location: [place];  Service: Ophthalmology;  Laterality: Left;  DIABETIC - oral meds, CPAP  . KIDNEY SURGERY     "froze mass"  . TONSILLECTOMY       Current Outpatient Medications  Medication Sig Dispense Refill  . acetaminophen (TYLENOL) 325 MG tablet Take 2 tablets (650 mg total) by mouth every 6 (six) hours as needed for mild pain (or Fever >/= 101).    [name] [name] albuterol (PROVENTIL HFA;VENTOLIN HFA) 108 (90 Base) MCG/ACT inhaler Inhale 2 puffs into the lungs every 6 (six) hours as needed for  wheezing or shortness of breath. 1 Inhaler 1  . Calcium-Vitamin D 600-200 MG-UNIT per tablet Take 1 tablet by mouth daily.      [name] [name] co-enzyme Q-10 30 MG capsule Take 30 mg by mouth daily.      . digoxin (LANOXIN) 0.125 MG tablet TAKE ONE TABLET BY MOUTH ONCE DAILY 90 tablet 3  . fish oil-omega-3 fatty acids 1000 MG capsule Take 2 g by mouth 2 (two) times daily.      . Gelatin Capsules, Empty, CAPS 2 capsules by Does not apply route 2 (two) times daily.     . Glucosamine 500 MG CAPS Take 1 capsule by mouth daily.      [name] [name] glucose blood (ONE TOUCH ULTRA TEST) test strip USE ONE STRIP TO CHECK GLUCOSE ONCE DAILY 100 each 12  . latanoprost (XALATAN) 0.005 % ophthalmic solution Place 1 drop into both eyes at bedtime.     [name] [name] levothyroxine (SYNTHROID, LEVOTHROID) 50 MCG tablet TAKE ONE TABLET BY MOUTH ONCE DAILY 90 tablet 3  . metFORMIN (GLUCOPHAGE) 500 MG tablet TAKE ONE TABLET BY MOUTH ONCE DAILY 90 tablet 3  . ONETOUCH DELICA LANCETS 59D MISC USE ONE LANCET TO CHECK BLOOD GLUCOSE ONCE DAILY 100 each 12  . POTASSIUM PO Take 1 tablet by mouth daily.      . simvastatin (ZOCOR) 40 MG tablet Take 0.5 tablets (20 mg total) by mouth at bedtime. 45 tablet 5  . vitamin B-12 (CYANOCOBALAMIN) 500 MCG tablet Take 500 mcg by mouth daily.      [name] [name] warfarin (COUMADIN) 5 MG tablet TAKE AS DIRECTED BY  COUMADIN  CLINIC (Patient taking differently: Take 5 mg by mouth daily. TAKE AS DIRECTED BY  COUMADIN  CLINIC) 90 tablet 1   No current facility-administered medications for this visit.     Allergies:   Macrolides and ketolides; Mycinettes; Nitrofuran derivatives; Nitrofurantoin; and Erythromycin    Social History:  The patient  reports that he quit smoking about 43 years ago. His smoking use included cigars and cigarettes. he has never used smokeless tobacco. He reports that he does not drink alcohol or use drugs.   Family History:  The patient's family history includes Arrhythmia in his brother and father.    ROS:   Please see the history of present illness.   Otherwise, review of systems are positive for none.   All other systems are reviewed and negative.    PHYSICAL EXAM: VS:  BP 124/60 (BP Location: Left Arm, Patient Position: Sitting, Cuff Size: Normal)   Pulse 93   Ht 5\' 9"  (1.753 m)   Wt 197 lb 8 oz (89.6 kg)   BMI 29.17 kg/m  , BMI Body mass index is 29.17 kg/m. GEN: Well nourished, well developed, in no acute distress  HEENT: normal  Neck: no JVD, carotid bruits, or masses Cardiac: Irregularly irregular; no rubs, or gallops,no edema . There is 3/6 crescendo decrescendo murmur in the aortic area which is mid peaking with mildly diminished S2. Respiratory:  clear to auscultation bilaterally, normal work of breathing GI: soft, nontender, nondistended, + BS MS: no deformity or atrophy  Skin: warm and dry, no rash Neuro:  Strength and sensation are intact Psych: euthymic mood, full affect   EKG:  EKG is ordered today. The ekg ordered today demonstrates atrial fibrillation, left ventricular hypertrophy with repolarization abnormalities.   Recent Labs: 11/12/2017: ALT 23; BUN 17; Creatinine, Ser 1.00; Hemoglobin 13.1; Platelets 232; Potassium 4.6; Sodium 142; TSH 4.850    Lipid Panel    Component Value Date/Time   CHOL 147 11/12/2017 0832   TRIG 120 11/12/2017 0832   HDL 41 11/12/2017 0832   CHOLHDL 4.1 12/10/2016 0953   LDLCALC 82 11/12/2017 0832      Wt Readings from Last 3 Encounters:  11/14/17 197 lb 8 oz (89.6 kg)  11/11/17 189 lb (85.7 kg)  11/11/17 189 lb 3.2 oz (85.8 kg)        ASSESSMENT AND PLAN:  1.  Chronic atrial fibrillation: Recent worsening of exertional dyspnea after hospitalization for pneumonia.  Ventricular rate is somewhat on the high side.  I elected to switch verapamil to metoprolol tartrate 50 mg twice daily.  Continue digoxin for now. He is on long-term anticoagulation with warfarin for many years.  2. Aortic stenosis:  Moderate on most recent  echocardiogram.  I requested a follow-up echocardiogram.  3. Hypertrophic obstructive cardiomyopathy: Significant LVOT gradient was noted on most recent echocardiogram.  His ventricular rate is on the high side which does not help with this condition.  I am hoping to be able to get his heart rate down more with metoprolol .  4. Hyperlipidemia currently on simvastatin 20 mg daily with a target LDL of less than 70 and diabetes.    Disposition:   FU with me in 1 months  Signed,  [name] Sacramento, MD  11/14/2017 2:41 PM    [place]

## 2017-11-14 NOTE — Patient Instructions (Addendum)
Medication Instructions:  Your physician has recommended you make the following change in your medication:  STOP taking verapamil START taking metoprolol 50mg  twice daily   Labwork: none  Testing/Procedures: Your physician has requested that you have an echocardiogram. Echocardiography is a painless test that uses sound waves to create images of your heart. It provides your doctor with information about the size and shape of your heart and how well your heart's chambers and valves are working. This procedure takes approximately one hour. There are no restrictions for this procedure.    Follow-Up: Your physician recommends that you schedule a follow-up appointment in: 1 month with Dr. Fletcher Anon.    Any Other Special Instructions Will Be Listed Below (If Applicable).     If you need a refill on your cardiac medications before your next appointment, please call your pharmacy.  Echocardiogram An echocardiogram, or echocardiography, uses sound waves (ultrasound) to produce an image of your heart. The echocardiogram is simple, painless, obtained within a short period of time, and offers valuable information to your health care provider. The images from an echocardiogram can provide information such as:  Evidence of coronary artery disease (CAD).  Heart size.  Heart muscle function.  Heart valve function.  Aneurysm detection.  Evidence of a past heart attack.  Fluid buildup around the heart.  Heart muscle thickening.  Assess heart valve function.  Tell a health care provider about:  Any allergies you have.  All medicines you are taking, including vitamins, herbs, eye drops, creams, and over-the-counter medicines.  Any problems you or family members have had with anesthetic medicines.  Any blood disorders you have.  Any surgeries you have had.  Any medical conditions you have.  Whether you are pregnant or may be pregnant. What happens before the procedure? No special  preparation is needed. Eat and drink normally. What happens during the procedure?  In order to produce an image of your heart, gel will be applied to your chest and a wand-like tool (transducer) will be moved over your chest. The gel will help transmit the sound waves from the transducer. The sound waves will harmlessly bounce off your heart to allow the heart images to be captured in real-time motion. These images will then be recorded.  You may need an IV to receive a medicine that improves the quality of the pictures. What happens after the procedure? You may return to your normal schedule including diet, activities, and medicines, unless your health care provider tells you otherwise. This information is not intended to replace advice given to you by your health care provider. Make sure you discuss any questions you have with your health care provider. Document Released: 09/28/2000 Document Revised: 05/19/2016 Document Reviewed: 06/08/2013 Elsevier Interactive Patient Education  2017 [employer].

## 2017-11-15 ENCOUNTER — Encounter: Payer: Self-pay | Admitting: Pulmonary Disease

## 2017-11-15 NOTE — Progress Notes (Signed)
PULMONARY CONSULT NOTE  Requesting MD/Service: post hospital Date of initial consultation: 11/08/80 Reason for consultation: Unclear  PT PROFILE: 84 y.o. male former smoker who continues to smoke one cigar daily admitted to [facility] 12/05-12/08/18 with cough, SOB and discharged with diagnosis of PNA.  DATA: HRCT chest 09/20/17: no significant pulmonary fibrosi. Small bilateral pleural effusions. Scattered pulmonary nodules measure 6 mm or less in size. Enlarged pulmonary arteries, suggestive of pulmonary arterial Hypertension. Mediastinal and upper abdominal adenopathy   HPI:  84 year old gentleman with a history of hypertrophic cardiomyopathy recently hospitalized as documented above.  He presented with exertional dyspnea and cough.  He never had fever.  He states that he is now back to his baseline.  It is a day for exercise.  He denies exertional dyspnea.  He has no chest pain.  He has no fevers.  He denies lower extremity edema and calf tenderness.  He has never had hemoptysis.   He is followed by Dr. Velva Harman for chronic atrial fibrillation and hypertrophic obstructive cardiomyopathy.  He does have a history of obstructive sleep apnea.  He has been on CPAP for 5 years care of Dr. Ayesha Mohair.   Past Medical History:  Diagnosis Date  . Atherosclerosis of aorta ([place])    by CT scan in past  . Atrial fibrillation ([place])    and aflutter. pt has a left atrial circuit that is not ablated. was on amiodarone-stopped, now use rate control.   . Bladder cancer ([place])    hx; treated with BCG in past   . Carotid artery disease ([place])    There was calcified plaque but no stenosis by carotid artery screening done at [facility] October, 2013  . Diabetes mellitus    type not specified  . Diabetic neuropathy (HCC)    feet  . Elevated liver enzymes    over time; hx  . Excessive sweating   . Glaucoma   . Gout   . Head injury    when slipped on ice 2004-2005. stablaized and back on Coumadin    . Headache    migraines - distant past  . HOCM (hypertrophic obstructive cardiomyopathy) ([place])    a. 01/2016 Echo: EF 65-70%, no rwma, LVOT gradient of 80-31mmHg, mod AS, SAM, sev dil LA, PASP 5mmHg.  [name] Homocystinemia ([place])    elevated, mild   . Hypercholesterolemia    treated.   . Hypertension   . Kidney mass    laproscopic surgery woth cryoablation of a mass outside kidney followed at [facility].   . Moderate aortic stenosis    a. 01/2016 echo: mod AS.   [name] Motion sickness    ocean boats  . Nausea   . Obstructive sleep apnea    CPAP started successfully 2014  . Orthostatic hypotension    a. orthostatic. dehydration. hospitalized 11/11  . Sleep apnea    Significant obstructive sleep apnea diagnosed in August, 2012, the patient is to receive CPAP   . Stroke [facility])    "2 old strokes" CT and MRI. [place] [facility] 11/11. diagnosis was dehydration, no acutal reports.   . TSH elevation    on synthroid historically   . Unsteady gait    August, 2012    Past Surgical History:  Procedure Laterality Date  . BLADDER SURGERY    . CATARACT EXTRACTION W/PHACO Left 05/11/2015   Procedure: CATARACT EXTRACTION PHACO AND INTRAOCULAR LENS PLACEMENT (IOC);  Surgeon: Leandrew Koyanagi, MD;  Location: [place];  Service: Ophthalmology;  Laterality: Left;  DIABETIC - oral meds, CPAP  . KIDNEY SURGERY     "froze mass"  . TONSILLECTOMY      MEDICATIONS: I have reviewed all medications and confirmed regimen as documented  Social History   Socioeconomic History  . Marital status: Married    Spouse name: Not on file  . Number of children: 1  . Years of education: Not on file  . Highest education level: Bachelor's degree (e.g., BA, AB, BS)  Social Needs  . Financial resource strain: Not hard at all  . Food insecurity - worry: Never true  . Food insecurity - inability: Never true  . Transportation needs - medical: No  . Transportation needs - non-medical: No  Occupational History  .  Occupation: retired  Tobacco Use  . Smoking status: Former Smoker    Types: Cigars, Cigarettes    Last attempt to quit: 10/02/1974    Years since quitting: 43.1  . Smokeless tobacco: Never Used  . Tobacco comment: still smokes cigars once a day  Substance and Sexual Activity  . Alcohol use: No    Frequency: Never  . Drug use: No  . Sexual activity: Yes  Other Topics Concern  . Not on file  Social History Narrative   Married, retired, gets regular exercise.     Family History  Problem Relation Age of Onset  . Arrhythmia Father        A-Fib  . Arrhythmia Brother        A-Fib  . Heart attack Neg Hx   . Hypertension Neg Hx   . Stroke Neg Hx     ROS: No fever, myalgias/arthralgias, unexplained weight loss or weight gain No new focal weakness or sensory deficits No otalgia, hearing loss, visual changes, nasal and sinus symptoms, mouth and throat problems No neck pain or adenopathy No abdominal pain, N/V/D, diarrhea, change in bowel pattern No dysuria, change in urinary pattern   Vitals:   11/08/17 0958  BP: 128/68  Pulse: 90  SpO2: 97%  Weight: 83 kg (183 lb)  Height: 5\' 9"  (1.753 m)  Room air   EXAM:  Gen: WDWN, No overt respiratory distress HEENT: NCAT, sclera white, oropharynx normal Neck: Supple without LAN, thyromegaly, JVD Lungs: breath sounds full without wheezes or other adventitious sounds Cardiovascular: Regular rate, mildly irregular, III/VI systolic murmur radiating towards neck Abdomen: Soft, nontender, normal BS Ext: without clubbing, cyanosis, edema Neuro: CNs grossly intact, motor and sensory intact Skin: Limited exam, no lesions noted  DATA:   BMP Latest Ref Rng & Units 11/12/2017 09/20/2017 09/19/2017  Glucose 65 - 99 mg/dL 130(H) 134(H) 157(H)  BUN 8 - 27 mg/dL 17 20 20   Creatinine 0.76 - 1.27 mg/dL 1.00 0.95 0.99  BUN/Creat Ratio 10 - 24 17 - -  Sodium 134 - 144 mmol/L 142 131(L) 133(L)  Potassium 3.5 - 5.2 mmol/L 4.6 4.1 3.8  Chloride  96 - 106 mmol/L 101 100(L) 103  CO2 20 - 29 mmol/L 22 22 21(L)  Calcium 8.6 - 10.2 mg/dL 9.6 8.4(L) 8.4(L)    CBC Latest Ref Rng & Units 11/12/2017 09/20/2017 09/19/2017  WBC 3.4 - 10.8 x10E3/uL 7.5 8.4 9.8  Hemoglobin 13.0 - 17.7 g/dL 13.1 12.7(L) 12.4(L)  Hematocrit 37.5 - 51.0 % 39.0 36.5(L) 35.3(L)  Platelets 150 - 379 x10E3/uL 232 156 169    CXR 10/29/17: No acute cardiac or pulmonary findings  IMPRESSION:     ICD-10-CM   1. Multiple pulmonary nodules R91.8   2. Hypertrophic obstructive  cardiomyopathy ([place]) I42.1   3. Chronic atrial fibrillation (HCC) I48.2   4.   Recent pneumonia Z87.01    It is not entirely clear to me whether he truly had pneumonia was admitted in December with an exacerbation of CHF on the basis of hypertrophic obstructive cardiomyopathy and atrial fibrillation.  Nonetheless, he is now back to his baseline that his exercise tolerance is excellent.  There are indeed a few scattered pulmonary nodules noted incidentally on th CT scan of the chest performed 09/20/18.  I think these are unlikely to represent malignancy.  Some of the nodules appear to be calcified suggesting a granulomatous process.   On the CT scan, there is also a suggestion of mediastinal adenopathy.  However, this is a noncontrasted study and it is extremely difficult to make that determination definitively.   PLAN:  We discussed the CT scan findings as noted above.  I reassured him that the findings are unlikely to represent malignancy.  At the most, I suggested that we could repeat a CT chest 3-6 months.  If the findings remain stable in that period of time, I do not think any further evaluation is indicated.  He does not appear to need any further pulmonary evaluation other than that.  I suggested that he could get the above CT scan scheduled through his primary care physician (Dr. Rosanna Randy)  Follow-up as needed   Merton Border, MD PCCM service Mobile [phone] Pager  [phone] 11/15/2017 4:32 PM

## 2017-11-25 ENCOUNTER — Ambulatory Visit (INDEPENDENT_AMBULATORY_CARE_PROVIDER_SITE_OTHER): Payer: PPO

## 2017-11-25 ENCOUNTER — Ambulatory Visit (INDEPENDENT_AMBULATORY_CARE_PROVIDER_SITE_OTHER): Payer: PPO | Admitting: Family Medicine

## 2017-11-25 VITALS — BP 122/56 | HR 88 | Temp 97.9°F | Resp 18 | Wt 191.0 lb

## 2017-11-25 DIAGNOSIS — J189 Pneumonia, unspecified organism: Secondary | ICD-10-CM

## 2017-11-25 DIAGNOSIS — I482 Chronic atrial fibrillation, unspecified: Secondary | ICD-10-CM

## 2017-11-25 DIAGNOSIS — R0602 Shortness of breath: Secondary | ICD-10-CM | POA: Diagnosis not present

## 2017-11-25 DIAGNOSIS — I4892 Unspecified atrial flutter: Secondary | ICD-10-CM | POA: Diagnosis not present

## 2017-11-25 DIAGNOSIS — Z5181 Encounter for therapeutic drug level monitoring: Secondary | ICD-10-CM

## 2017-11-25 DIAGNOSIS — I4891 Unspecified atrial fibrillation: Secondary | ICD-10-CM

## 2017-11-25 DIAGNOSIS — G4733 Obstructive sleep apnea (adult) (pediatric): Secondary | ICD-10-CM

## 2017-11-25 LAB — POCT INR: INR: 1.9

## 2017-11-25 NOTE — Progress Notes (Signed)
Patient: [name] [name] Male    DOB: 1933/11/11   83 y.o.   MRN: [id number] Visit Date: 11/25/2017  Today's Provider: [name], MD   Chief Complaint  Patient presents with  . Shortness of Breath   Subjective:    HPI Pt walked in today with shortness of breath. He denies chest pain. He does say that he has a burning sensation in his chest when he takes a breath outside when it is cold. Wife seems to have noticed this more than he has. No sweats, fevers, chills, or weakness      Allergies  Allergen Reactions  . Macrolides And Ketolides Other (See Comments)  . Mycinettes   . Nitrofuran Derivatives Other (See Comments)  . Nitrofurantoin Other (See Comments)  . Erythromycin Itching and Rash    Other reaction(s): UNKNOWN  And red skin     Current Outpatient Medications:  .  acetaminophen (TYLENOL) 325 MG tablet, Take 2 tablets (650 mg total) by mouth every 6 (six) hours as needed for mild pain (or Fever >/= 101)., Disp: , Rfl:  .  albuterol (PROVENTIL HFA;VENTOLIN HFA) 108 (90 Base) MCG/ACT inhaler, Inhale 2 puffs into the lungs every 6 (six) hours as needed for wheezing or shortness of breath., Disp: 1 Inhaler, Rfl: 1 .  Calcium-Vitamin D 600-200 MG-UNIT per tablet, Take 1 tablet by mouth daily.  , Disp: , Rfl:  .  co-enzyme Q-10 30 MG capsule, Take 30 mg by mouth daily.  , Disp: , Rfl:  .  digoxin (LANOXIN) 0.125 MG tablet, TAKE ONE TABLET BY MOUTH ONCE DAILY, Disp: 90 tablet, Rfl: 3 .  fish oil-omega-3 fatty acids 1000 MG capsule, Take 2 g by mouth 2 (two) times daily.  , Disp: , Rfl:  .  Gelatin Capsules, Empty, CAPS, 2 capsules by Does not apply route 2 (two) times daily. , Disp: , Rfl:  .  Glucosamine 500 MG CAPS, Take 1 capsule by mouth daily.  , Disp: , Rfl:  .  glucose blood (ONE TOUCH ULTRA TEST) test strip, USE ONE STRIP TO CHECK GLUCOSE ONCE DAILY, Disp: 100 each, Rfl: 12 .  latanoprost (XALATAN) 0.005 % ophthalmic solution, Place 1 drop into both  eyes at bedtime. , Disp: , Rfl:  .  levothyroxine (SYNTHROID, LEVOTHROID) 50 MCG tablet, TAKE ONE TABLET BY MOUTH ONCE DAILY, Disp: 90 tablet, Rfl: 3 .  metFORMIN (GLUCOPHAGE) 500 MG tablet, TAKE ONE TABLET BY MOUTH ONCE DAILY, Disp: 90 tablet, Rfl: 3 .  metoprolol tartrate (LOPRESSOR) 50 MG tablet, Take 1 tablet (50 mg total) by mouth 2 (two) times daily., Disp: 180 tablet, Rfl: 3 .  ONETOUCH DELICA LANCETS 26S MISC, USE ONE LANCET TO CHECK BLOOD GLUCOSE ONCE DAILY, Disp: 100 each, Rfl: 12 .  POTASSIUM PO, Take 1 tablet by mouth daily.  , Disp: , Rfl:  .  simvastatin (ZOCOR) 40 MG tablet, Take 0.5 tablets (20 mg total) by mouth at bedtime., Disp: 45 tablet, Rfl: 5 .  vitamin B-12 (CYANOCOBALAMIN) 500 MCG tablet, Take 500 mcg by mouth daily.  , Disp: , Rfl:  .  warfarin (COUMADIN) 5 MG tablet, TAKE AS DIRECTED BY  COUMADIN  CLINIC (Patient taking differently: Take 5 mg by mouth daily. TAKE AS DIRECTED BY  COUMADIN  CLINIC), Disp: 90 tablet, Rfl: 1  Review of Systems  Constitutional: Negative.   HENT: Negative.   Eyes: Negative.   Respiratory: Positive for shortness of breath.   Cardiovascular:  Negative.   Gastrointestinal: Negative.   Endocrine: Negative.   Genitourinary: Negative.   Musculoskeletal: Negative.   Skin: Negative.   Allergic/Immunologic: Negative.   Neurological: Negative.   Hematological: Negative.   Psychiatric/Behavioral: Negative.     Social History   Tobacco Use  . Smoking status: Former Smoker    Types: Cigars, Cigarettes    Last attempt to quit: 10/02/1974    Years since quitting: 43.1  . Smokeless tobacco: Never Used  . Tobacco comment: still smokes cigars once a day  Substance Use Topics  . Alcohol use: No    Frequency: Never   Objective:   BP (!) 122/56   Pulse 88   Temp 97.9 F (36.6 C) (Oral)   Resp 18   Wt 191 lb (86.6 kg)   SpO2 99%   BMI 28.21 kg/m  Vitals:   11/25/17 1242  BP: (!) 122/56  Pulse: 88  Resp: 18  Temp: 97.9 F (36.6 C)   TempSrc: Oral  SpO2: 99%  Weight: 191 lb (86.6 kg)     Physical Exam  Constitutional: He is oriented to person, place, and time. He appears well-developed and well-nourished.  HENT:  Head: Normocephalic and atraumatic.  Right Ear: External ear normal.  Left Ear: External ear normal.  Mouth/Throat: Oropharynx is clear and moist.  Eyes: Conjunctivae are normal. No scleral icterus.  Neck: No thyromegaly present.  Cardiovascular: Normal rate, regular rhythm and normal heart sounds.  Pulmonary/Chest: Effort normal and breath sounds normal.  Abdominal: Soft.  Musculoskeletal: He exhibits no edema.  Neurological: He is alert and oriented to person, place, and time.  Skin: Skin is warm and dry.  Psychiatric: He has a normal mood and affect. His behavior is normal. Judgment and thought content normal.        Assessment & Plan:     1. Shortness of breath Description fits mild reactive airway disease vs CHF or CAD.Add Breo inhaler. RTC 2-3 weeks. - EKG 12-Lead 2.Known Afib 3.Recent Pneumonia  I have done the exam and reviewed the chart and it is accurate to the best of my knowledge. Development worker, community has been used and  any errors in dictation or transcription are unintentional. Miguel Aschoff M.D. [place], MD  [facility]

## 2017-11-25 NOTE — Patient Instructions (Signed)
Please take extra 1/2 tablet tonight, then continue dosage of 1 tablet everyday except 1/2 tablet on SUNDAYS, [name] [name]. Please be consistent with your greens intake.  Recheck in 2 weeks.

## 2017-11-26 NOTE — Telephone Encounter (Signed)
Visit completed.

## 2017-11-29 ENCOUNTER — Encounter: Payer: Self-pay | Admitting: Family Medicine

## 2017-12-04 ENCOUNTER — Ambulatory Visit (INDEPENDENT_AMBULATORY_CARE_PROVIDER_SITE_OTHER): Payer: PPO

## 2017-12-04 DIAGNOSIS — I482 Chronic atrial fibrillation, unspecified: Secondary | ICD-10-CM

## 2017-12-04 DIAGNOSIS — I4891 Unspecified atrial fibrillation: Secondary | ICD-10-CM

## 2017-12-04 DIAGNOSIS — Z5181 Encounter for therapeutic drug level monitoring: Secondary | ICD-10-CM

## 2017-12-04 DIAGNOSIS — C649 Malignant neoplasm of unspecified kidney, except renal pelvis: Secondary | ICD-10-CM | POA: Diagnosis not present

## 2017-12-04 DIAGNOSIS — I4892 Unspecified atrial flutter: Secondary | ICD-10-CM | POA: Diagnosis not present

## 2017-12-04 LAB — POCT INR: INR: 3.4

## 2017-12-04 NOTE — Patient Instructions (Signed)
Please skip coumadin tonight and have some greens tonigth, then continue dosage of 1 tablet everyday except 1/2 tablet on SUNDAYS, [place]. Please be consistent with your greens intake.  Recheck in 2 weeks.

## 2017-12-09 ENCOUNTER — Ambulatory Visit: Payer: Self-pay | Admitting: Family Medicine

## 2017-12-12 ENCOUNTER — Other Ambulatory Visit: Payer: Self-pay

## 2017-12-12 ENCOUNTER — Ambulatory Visit (INDEPENDENT_AMBULATORY_CARE_PROVIDER_SITE_OTHER): Payer: PPO

## 2017-12-12 DIAGNOSIS — I482 Chronic atrial fibrillation, unspecified: Secondary | ICD-10-CM

## 2017-12-12 DIAGNOSIS — R0602 Shortness of breath: Secondary | ICD-10-CM | POA: Diagnosis not present

## 2017-12-12 LAB — ECHOCARDIOGRAM COMPLETE
AOASC: 36 cm
AV area mean vel ind: 1.46 cm2/m2
AV vel: 2.95
AVA: 2.95 cm2
AVAREAMEANV: 2.97 cm2
AVAREAVTIIND: 1.45 cm2/m2
AVCELMEANRAT: 0.86
AVG: 23 mmHg
CHL CUP AV VALUE AREA INDEX: 1.45
CHL CUP DOP CALC LVOT VTI: 51.7 cm
CHL CUP LVOT MV VTI INDEX: 1.68 cm2/m2
CHL CUP LVOT MV VTI: 3.41
CHL CUP MV DEC (S): 204
CHL CUP TV REG PEAK VELOCITY: 323 cm/s
DOP CAL AO MEAN VELOCITY: 203 cm/s
E decel time: 204 msec
EERAT: 34.05
FS: 58 % — AB (ref 28–44)
IVS/LV PW RATIO, ED: 1.13
LA ID, A-P, ES: 57 mm
LA diam end sys: 57 mm
LA diam index: 2.81 cm/m2
LA vol A4C: 164 ml
LA vol index: 81.8 mL/m2
LA vol: 166 mL
LV PW d: 16 mm — AB (ref 0.6–1.1)
LV TDI E'MEDIAL: 6.31
LV e' LATERAL: 5.55 cm/s
LVEEAVG: 34.05
LVEEMED: 34.05
LVOT area: 3.46 cm2
LVOT diameter: 21 mm
LVOTSV: 179 mL
LVOTVTI: 0.85 cm
Lateral S' vel: 7.94 cm/s
MV Annulus VTI: 52.4 cm
MV M vel: 120
MVA2D: 1.97 cm2
MVAP: 3.73 cm2
MVG: 8 mmHg
MVPG: 14 mmHg
MVPKEVEL: 189 m/s
P 1/2 time: 60 ms
TAPSE: 11.7 mm
TDI e' lateral: 5.55
TR max vel: 323 cm/s
VTI: 60.7 cm

## 2017-12-16 ENCOUNTER — Ambulatory Visit (INDEPENDENT_AMBULATORY_CARE_PROVIDER_SITE_OTHER): Payer: PPO

## 2017-12-16 ENCOUNTER — Telehealth: Payer: Self-pay | Admitting: Family Medicine

## 2017-12-16 DIAGNOSIS — I4891 Unspecified atrial fibrillation: Secondary | ICD-10-CM | POA: Diagnosis not present

## 2017-12-16 DIAGNOSIS — Z5181 Encounter for therapeutic drug level monitoring: Secondary | ICD-10-CM

## 2017-12-16 DIAGNOSIS — I4892 Unspecified atrial flutter: Secondary | ICD-10-CM

## 2017-12-16 DIAGNOSIS — I482 Chronic atrial fibrillation, unspecified: Secondary | ICD-10-CM

## 2017-12-16 LAB — POCT INR: INR: 2.3

## 2017-12-16 NOTE — Patient Instructions (Signed)
Please continue dosage of 1 tablet everyday except 1/2 tablet on SUNDAYS, [place]. Please be consistent with your greens intake.  Recheck in 3 weeks.

## 2017-12-16 NOTE — Telephone Encounter (Signed)
Pt informed

## 2017-12-16 NOTE — Telephone Encounter (Signed)
Tried to cal pt LMTCB. [facility] sent this over on Friday, Dr.Gilbert was not here but I have faxed it back over today with Dr. Alben Spittle approval.

## 2017-12-16 NOTE — Telephone Encounter (Signed)
Pt requesting a refill of levothyroxine 50 MCG.  States the manufacture has changed and pharmacy will not refill until Dr. Rosanna Randy gives the okay.  Pt is out of medication.   [place]

## 2017-12-20 ENCOUNTER — Ambulatory Visit: Payer: PPO | Admitting: Cardiovascular Disease

## 2017-12-20 ENCOUNTER — Encounter: Payer: Self-pay | Admitting: Cardiovascular Disease

## 2017-12-20 VITALS — BP 130/62 | HR 81 | Ht 69.0 in | Wt 189.0 lb

## 2017-12-20 DIAGNOSIS — I359 Nonrheumatic aortic valve disorder, unspecified: Secondary | ICD-10-CM | POA: Diagnosis not present

## 2017-12-20 DIAGNOSIS — I482 Chronic atrial fibrillation, unspecified: Secondary | ICD-10-CM

## 2017-12-20 DIAGNOSIS — E785 Hyperlipidemia, unspecified: Secondary | ICD-10-CM | POA: Diagnosis not present

## 2017-12-20 DIAGNOSIS — I421 Obstructive hypertrophic cardiomyopathy: Secondary | ICD-10-CM

## 2017-12-20 NOTE — Patient Instructions (Signed)
Medication Instructions: Continue same medications.   Labwork: None.   Procedures/Testing: None.   Follow-Up: 6 months with Dr. Khaya Theissen.   Any Additional Special Instructions Will Be Listed Below (If Applicable).     If you need a refill on your cardiac medications before your next appointment, please call your pharmacy.   

## 2017-12-20 NOTE — Progress Notes (Signed)
Cardiology Office Note   Date:  12/20/2017   ID:  [name], [name] 05/25/1934, MRN [id number]  PCP:  [name], MD  Cardiologist:   Kathlyn [place], MD   Chief Complaint  Patient presents with  . Other    1 month follow up from ECHO. Patient c/o SOB if he over does himself.  Meds reviewed verbally with patient.       History of Present Illness: [name] [name] is a 84 y.o. male who presents for for a follow-up visit regarding chronic atrial fibrillation, hypertrophic obstructive cardiomyopathy and aortic stenosis.   He is active. He volunteers regularly at [facility].  Echocardiogram in April 2017 showed an ejection fraction of 65-70%, significant LVOT obstruction with a peak gradient of 80-85 mmHg and moderate aortic stenosis. Left atrium was severely dilated with moderate pulmonary hypertension. There was mild mitral stenosis. He was hospitalized in December, 2018 with pneumonia.  He improved with antibiotics.  He had issues with tachycardia and hypotension while hospitalized.  During last visit, I switch him from verapamil to metoprolol as his ventricular rate was not optimally controlled.  He had an echocardiogram done which showed normal LV systolic function, stable moderate aortic stenosis, mild mitral stenosis and mild pulmonary hypertension with peak systolic pressure of 47 mmHg.  He is feeling better but reports that shortness of breath is not back to baseline.  Past Medical History:  Diagnosis Date  . Atherosclerosis of aorta ([place])    by CT scan in past  . Atrial fibrillation ([place])    and aflutter. pt has a left atrial circuit that is not ablated. was on amiodarone-stopped, now use rate control.   . Bladder cancer ([place])    hx; treated with BCG in past   . Carotid artery disease ([place])    There was calcified plaque but no stenosis by carotid artery screening done at [facility] October, 2013  . Diabetes mellitus    type not  specified  . Diabetic neuropathy (HCC)    feet  . Elevated liver enzymes    over time; hx  . Excessive sweating   . Glaucoma   . Gout   . Head injury    when slipped on ice 2004-2005. stablaized and back on Coumadin   . Headache    migraines - distant past  . HOCM (hypertrophic obstructive cardiomyopathy) ([place])    a. 01/2016 Echo: EF 65-70%, no rwma, LVOT gradient of 80-50mmHg, mod AS, SAM, sev dil LA, PASP 80mmHg.  [name] Homocystinemia ([place])    elevated, mild   . Hypercholesterolemia    treated.   . Hypertension   . Kidney mass    laproscopic surgery woth cryoablation of a mass outside kidney followed at [facility].   . Moderate aortic stenosis    a. 01/2016 echo: mod AS.   [name] Motion sickness    ocean boats  . Nausea   . Obstructive sleep apnea    CPAP started successfully 2014  . Orthostatic hypotension    a. orthostatic. dehydration. hospitalized 11/11  . Sleep apnea    Significant obstructive sleep apnea diagnosed in August, 2012, the patient is to receive CPAP   . Stroke [facility])    "2 old strokes" CT and MRI. [place] [facility] 11/11. diagnosis was dehydration, no acutal reports.   . TSH elevation    on synthroid historically   . Unsteady gait    August, 2012    Past Surgical History:  Procedure Laterality Date  . BLADDER SURGERY    . CATARACT EXTRACTION W/PHACO Left 05/11/2015   Procedure: CATARACT EXTRACTION PHACO AND INTRAOCULAR LENS PLACEMENT (IOC);  Surgeon: Leandrew Koyanagi, MD;  Location: [place];  Service: Ophthalmology;  Laterality: Left;  DIABETIC - oral meds, CPAP  . KIDNEY SURGERY     "froze mass"  . TONSILLECTOMY       Current Outpatient Medications  Medication Sig Dispense Refill  . acetaminophen (TYLENOL) 325 MG tablet Take 2 tablets (650 mg total) by mouth every 6 (six) hours as needed for mild pain (or Fever >/= 101).    [name] albuterol (PROVENTIL HFA;VENTOLIN HFA) 108 (90 Base) MCG/ACT inhaler Inhale 2 puffs into the lungs every 6 (six) hours as  needed for wheezing or shortness of breath. 1 Inhaler 1  . Calcium-Vitamin D 600-200 MG-UNIT per tablet Take 1 tablet by mouth daily.      [name] co-enzyme Q-10 30 MG capsule Take 30 mg by mouth daily.      . digoxin (LANOXIN) 0.125 MG tablet TAKE ONE TABLET BY MOUTH ONCE DAILY 90 tablet 3  . fish oil-omega-3 fatty acids 1000 MG capsule Take 2 g by mouth 2 (two) times daily.      . Gelatin Capsules, Empty, CAPS 2 capsules by Does not apply route 2 (two) times daily.     . Glucosamine 500 MG CAPS Take 1 capsule by mouth daily.      [name] glucose blood (ONE TOUCH ULTRA TEST) test strip USE ONE STRIP TO CHECK GLUCOSE ONCE DAILY 100 each 12  . latanoprost (XALATAN) 0.005 % ophthalmic solution Place 1 drop into both eyes at bedtime.     [name] levothyroxine (SYNTHROID, LEVOTHROID) 50 MCG tablet TAKE ONE TABLET BY MOUTH ONCE DAILY 90 tablet 3  . metFORMIN (GLUCOPHAGE) 500 MG tablet TAKE ONE TABLET BY MOUTH ONCE DAILY 90 tablet 3  . metoprolol tartrate (LOPRESSOR) 50 MG tablet Take 1 tablet (50 mg total) by mouth 2 (two) times daily. 180 tablet 3  . ONETOUCH DELICA LANCETS 56E MISC USE ONE LANCET TO CHECK BLOOD GLUCOSE ONCE DAILY 100 each 12  . POTASSIUM PO Take 1 tablet by mouth daily.      . simvastatin (ZOCOR) 40 MG tablet Take 0.5 tablets (20 mg total) by mouth at bedtime. 45 tablet 5  . vitamin B-12 (CYANOCOBALAMIN) 500 MCG tablet Take 500 mcg by mouth daily.      [name] warfarin (COUMADIN) 5 MG tablet TAKE AS DIRECTED BY  COUMADIN  CLINIC (Patient taking differently: Take 5 mg by mouth daily. TAKE AS DIRECTED BY  COUMADIN  CLINIC) 90 tablet 1   No current facility-administered medications for this visit.     Allergies:   Macrolides and ketolides; Mycinettes; Nitrofuran derivatives; Nitrofurantoin; and Erythromycin    Social History:  The patient  reports that he quit smoking about 43 years ago. His smoking use included cigars and cigarettes. he has never used smokeless tobacco. He reports that he does not drink  alcohol or use drugs.   Family History:  The patient's family history includes Arrhythmia in his brother and father.    ROS:  Please see the history of present illness.   Otherwise, review of systems are positive for none.   All other systems are reviewed and negative.    PHYSICAL EXAM: VS:  BP 130/62 (BP Location: Left Arm, Patient Position: Sitting, Cuff Size: Normal)   Pulse 81   Ht 5\' 9"  (1.753 m)  Wt 189 lb (85.7 kg)   BMI 27.91 kg/m  , BMI Body mass index is 27.91 kg/m. GEN: Well nourished, well developed, in no acute distress  HEENT: normal  Neck: no JVD, carotid bruits, or masses Cardiac: Irregularly irregular; no rubs, or gallops,no edema . There is 3/6 crescendo decrescendo murmur in the aortic area which is mid peaking with mildly diminished S2. Respiratory:  clear to auscultation bilaterally, normal work of breathing GI: soft, nontender, nondistended, + BS MS: no deformity or atrophy  Skin: warm and dry, no rash Neuro:  Strength and sensation are intact Psych: euthymic mood, full affect   EKG:  EKG is ordered today. The ekg ordered today demonstrates atrial fibrillation, left ventricular hypertrophy with repolarization abnormalities.  Ventricular rate is 81 bpm.   Recent Labs: 11/12/2017: ALT 23; BUN 17; Creatinine, Ser 1.00; Hemoglobin 13.1; Platelets 232; Potassium 4.6; Sodium 142; TSH 4.850    Lipid Panel    Component Value Date/Time   CHOL 147 11/12/2017 0832   TRIG 120 11/12/2017 0832   HDL 41 11/12/2017 0832   CHOLHDL 4.1 12/10/2016 0953   LDLCALC 82 11/12/2017 0832      Wt Readings from Last 3 Encounters:  12/20/17 189 lb (85.7 kg)  11/25/17 191 lb (86.6 kg)  11/14/17 197 lb 8 oz (89.6 kg)        ASSESSMENT AND PLAN:  1.  Chronic atrial fibrillation: Ventricular rate is more controlled after switching verapamil to metoprolol.  The dose can be increased in the future if needed.  Continue digoxin for now. He is on long-term anticoagulation  with warfarin for many years.  2. Aortic stenosis: Moderate on most recent echocardiogram.  This has been stable.  3. Hypertrophic obstructive cardiomyopathy: Significant LVOT gradient noted in the past.  The goal is to improve his resting heart rate and increase metoprolol as tolerated.  4. Hyperlipidemia currently on simvastatin 20 mg daily with a target LDL of less than 70 and diabetes.    Disposition:   FU with me in 6 months  Signed,  Kathlyn [place], MD  12/20/2017 1:36 PM    [place]

## 2017-12-23 DIAGNOSIS — H401131 Primary open-angle glaucoma, bilateral, mild stage: Secondary | ICD-10-CM | POA: Diagnosis not present

## 2017-12-24 ENCOUNTER — Other Ambulatory Visit: Payer: Self-pay | Admitting: Cardiovascular Disease

## 2017-12-25 ENCOUNTER — Other Ambulatory Visit: Payer: Self-pay | Admitting: Family Medicine

## 2017-12-30 DIAGNOSIS — H401131 Primary open-angle glaucoma, bilateral, mild stage: Secondary | ICD-10-CM | POA: Diagnosis not present

## 2018-01-06 ENCOUNTER — Ambulatory Visit (INDEPENDENT_AMBULATORY_CARE_PROVIDER_SITE_OTHER): Payer: PPO

## 2018-01-06 DIAGNOSIS — I482 Chronic atrial fibrillation, unspecified: Secondary | ICD-10-CM

## 2018-01-06 DIAGNOSIS — Z5181 Encounter for therapeutic drug level monitoring: Secondary | ICD-10-CM

## 2018-01-06 DIAGNOSIS — E119 Type 2 diabetes mellitus without complications: Secondary | ICD-10-CM | POA: Diagnosis not present

## 2018-01-06 DIAGNOSIS — I4891 Unspecified atrial fibrillation: Secondary | ICD-10-CM | POA: Diagnosis not present

## 2018-01-06 DIAGNOSIS — B351 Tinea unguium: Secondary | ICD-10-CM | POA: Diagnosis not present

## 2018-01-06 DIAGNOSIS — I4892 Unspecified atrial flutter: Secondary | ICD-10-CM | POA: Diagnosis not present

## 2018-01-06 LAB — POCT INR: INR: 3.7

## 2018-01-06 NOTE — Patient Instructions (Signed)
Please skip coumadin today, then continue dosage of 1 tablet everyday except 1/2 tablet on SUNDAYS, [place]. Please be consistent with your greens intake - pick a day each week to have your greens and have them on that same day each week. Recheck in 2 weeks.

## 2018-01-22 ENCOUNTER — Ambulatory Visit (INDEPENDENT_AMBULATORY_CARE_PROVIDER_SITE_OTHER): Payer: PPO

## 2018-01-22 DIAGNOSIS — I4892 Unspecified atrial flutter: Secondary | ICD-10-CM

## 2018-01-22 DIAGNOSIS — I4891 Unspecified atrial fibrillation: Secondary | ICD-10-CM | POA: Diagnosis not present

## 2018-01-22 DIAGNOSIS — Z5181 Encounter for therapeutic drug level monitoring: Secondary | ICD-10-CM | POA: Diagnosis not present

## 2018-01-22 DIAGNOSIS — I482 Chronic atrial fibrillation, unspecified: Secondary | ICD-10-CM

## 2018-01-22 LAB — POCT INR: INR: 3.1

## 2018-01-22 NOTE — Patient Instructions (Signed)
Please skip coumadin today, then START NEW dosage of 1/2 tablet everyday except 1 tablet on SUNDAYS, [place]. Please be consistent with your greens intake - pick a day each week to have your greens and have them on that same day each week. Recheck in 2 weeks.

## 2018-02-05 ENCOUNTER — Ambulatory Visit (INDEPENDENT_AMBULATORY_CARE_PROVIDER_SITE_OTHER): Payer: PPO

## 2018-02-05 DIAGNOSIS — I4892 Unspecified atrial flutter: Secondary | ICD-10-CM

## 2018-02-05 DIAGNOSIS — Z5181 Encounter for therapeutic drug level monitoring: Secondary | ICD-10-CM | POA: Diagnosis not present

## 2018-02-05 DIAGNOSIS — I4891 Unspecified atrial fibrillation: Secondary | ICD-10-CM | POA: Diagnosis not present

## 2018-02-05 DIAGNOSIS — I482 Chronic atrial fibrillation, unspecified: Secondary | ICD-10-CM

## 2018-02-05 LAB — POCT INR: INR: 2.2

## 2018-02-05 NOTE — Patient Instructions (Signed)
Please continue dosage of 1/2 tablet everyday except 1 tablet on SUNDAYS, [name] [name]. Please be consistent with your greens intake - pick a day each week to have your greens and have them on that same day each week. Recheck in 3 weeks.

## 2018-02-13 ENCOUNTER — Telehealth: Payer: Self-pay | Admitting: Cardiovascular Disease

## 2018-02-13 ENCOUNTER — Encounter: Payer: Self-pay | Admitting: Family Medicine

## 2018-02-13 ENCOUNTER — Ambulatory Visit (INDEPENDENT_AMBULATORY_CARE_PROVIDER_SITE_OTHER): Payer: PPO | Admitting: Family Medicine

## 2018-02-13 VITALS — BP 96/62 | HR 75 | Temp 97.5°F | Resp 16

## 2018-02-13 DIAGNOSIS — I952 Hypotension due to drugs: Secondary | ICD-10-CM | POA: Diagnosis not present

## 2018-02-13 DIAGNOSIS — I482 Chronic atrial fibrillation, unspecified: Secondary | ICD-10-CM

## 2018-02-13 DIAGNOSIS — E039 Hypothyroidism, unspecified: Secondary | ICD-10-CM | POA: Diagnosis not present

## 2018-02-13 NOTE — Patient Instructions (Signed)
Decrease Metoprolol to 25mg  twice daily (break the pills in half)  We will call about labs in the next few days  Watch blood pressure and pulse

## 2018-02-13 NOTE — Telephone Encounter (Signed)
Spoke with pt's wife who states the pt had pneumonia in dec but still is having c/o SOB and no energy. She reports he has no appetite and recently has started seeing yellow when out in the sun. Wife thinks it could be related to digoxin and requesting that pt be seen. Appointment scheduled for 02/19/18 at 1030 with Murray Hodgkins.

## 2018-02-13 NOTE — Progress Notes (Signed)
Patient: [name] [name] Male    DOB: Sep 25, 1934   84 y.o.   MRN: [id number] Visit Date: 02/13/2018  Today's Provider: Lavon Paganini, MD   I, Martha Clan, CMA, am acting as scribe for Lavon Paganini, MD.  Chief Complaint  Patient presents with  . Hypotension   Subjective:    HPI   Pt is c/o fatigue and weakness. This has been present for several months, since having pneumonia in December, 2018. Pt saw Dr. Fletcher Anon on 11/14/2017 for his a-fib, SOB, aortic valve disorder, and cardiomyopathy. Dr. Fletcher Anon D/C pt's verapamil at that time, and started him on Metoprolol. Pt states his sx have been worsening since making that med change. He is also c/o SOB, visual changes (I.e seeing yellow, but states this has been present for 3-4 years ago.  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  Also has h/o ocular migraines), decreased appetite, early satiety dizziness, and polydipsia.  Pt's wife is concerned because she believes these sx could be due to digoxin toxicity. His wife did research, and learned that the digoxin could interact with metformin and calcium.   BP Readings from Last 3 Encounters:  02/13/18 96/62  12/20/17 130/62  11/25/17 (!) 122/56    Allergies  Allergen Reactions  . Macrolides And Ketolides Other (See Comments)  . Mycinettes   . Nitrofuran Derivatives Other (See Comments)  . Nitrofurantoin Other (See Comments)  . Erythromycin Itching and Rash    Other reaction(s): UNKNOWN  And red skin     Current Outpatient Medications:  .  Calcium-Vitamin D 600-200 MG-UNIT per tablet, Take 1 tablet by mouth daily.  , Disp: , Rfl:  .  co-enzyme Q-10 30 MG capsule, Take 30 mg by mouth daily.  , Disp: , Rfl:  .  digoxin (LANOXIN) 0.125 MG tablet, TAKE 1 TABLET BY MOUTH ONCE DAILY, Disp: 90 tablet, Rfl: 3 .  fish oil-omega-3 fatty acids 1000 MG capsule, Take 2 g by mouth 2 (two) times daily.  , Disp: , Rfl:  .  Gelatin Capsules, Empty, CAPS, 2 capsules by Does not apply  route 2 (two) times daily. , Disp: , Rfl:  .  Glucosamine 500 MG CAPS, Take 1 capsule by mouth daily.  , Disp: , Rfl:  .  glucose blood (ONE TOUCH ULTRA TEST) test strip, USE ONE STRIP TO CHECK GLUCOSE ONCE DAILY, Disp: 100 each, Rfl: 12 .  latanoprost (XALATAN) 0.005 % ophthalmic solution, Place 1 drop into both eyes at bedtime. , Disp: , Rfl:  .  levothyroxine (SYNTHROID, LEVOTHROID) 50 MCG tablet, TAKE ONE TABLET BY MOUTH ONCE DAILY, Disp: 90 tablet, Rfl: 3 .  metFORMIN (GLUCOPHAGE) 500 MG tablet, TAKE 1 TABLET BY MOUTH ONCE DAILY, Disp: 90 tablet, Rfl: 3 .  metoprolol tartrate (LOPRESSOR) 50 MG tablet, Take 1 tablet (50 mg total) by mouth 2 (two) times daily., Disp: 180 tablet, Rfl: 3 .  ONETOUCH DELICA LANCETS 19F MISC, USE ONE LANCET TO CHECK BLOOD GLUCOSE ONCE DAILY, Disp: 100 each, Rfl: 12 .  POTASSIUM PO, Take 1 tablet by mouth daily.  , Disp: , Rfl:  .  simvastatin (ZOCOR) 40 MG tablet, Take 0.5 tablets (20 mg total) by mouth at bedtime., Disp: 45 tablet, Rfl: 5 .  vitamin B-12 (CYANOCOBALAMIN) 500 MCG tablet, Take 500 mcg by mouth daily.  , Disp: , Rfl:  .  warfarin (COUMADIN) 5 MG tablet, TAKE AS DIRECTED BY  COUMADIN  CLINIC (Patient taking differently: Take 5  mg by mouth daily. TAKE AS DIRECTED BY  COUMADIN  CLINIC), Disp: 90 tablet, Rfl: 1 .  albuterol (PROVENTIL HFA;VENTOLIN HFA) 108 (90 Base) MCG/ACT inhaler, Inhale 2 puffs into the lungs every 6 (six) hours as needed for wheezing or shortness of breath. (Patient not taking: Reported on 02/13/2018), Disp: 1 Inhaler, Rfl: 1  Review of Systems  Constitutional: Positive for activity change, appetite change and fatigue. Negative for chills, diaphoresis, fever and unexpected weight change.  Eyes: Positive for visual disturbance.  Respiratory: Positive for shortness of breath.   Cardiovascular: Negative for chest pain, palpitations and leg swelling.  Endocrine: Positive for polydipsia.  Neurological: Positive for dizziness and weakness.  Negative for syncope.    Social History   Tobacco Use  . Smoking status: Former Smoker    Types: Cigars, Cigarettes    Last attempt to quit: 10/02/1974    Years since quitting: 43.3  . Smokeless tobacco: Never Used  . Tobacco comment: still smokes cigars once a day  Substance Use Topics  . Alcohol use: No    Frequency: Never   Objective:   BP 96/62 (BP Location: Right Arm, Patient Position: Sitting, Cuff Size: Large)   Pulse 75   Temp (!) 97.5 F (36.4 C) (Oral)   Resp 16   SpO2 97%  Vitals:   02/13/18 1342  BP: 96/62  Pulse: 75  Resp: 16  Temp: (!) 97.5 F (36.4 C)  TempSrc: Oral  SpO2: 97%     Physical Exam  Constitutional: He is oriented to person, place, and time. He appears well-developed and well-nourished. No distress.  HENT:  Head: Normocephalic and atraumatic.  Eyes: Conjunctivae are normal. No scleral icterus.  Neck: Neck supple. No thyromegaly present.  Cardiovascular: Intact distal pulses.  Irregularly irregular  Pulmonary/Chest: Effort normal and breath sounds normal. No respiratory distress. He has no wheezes. He has no rales.  Musculoskeletal: He exhibits no edema.  Lymphadenopathy:    He has no cervical adenopathy.  Neurological: He is alert and oriented to person, place, and time. No cranial nerve deficit.  Skin: Skin is warm and dry. Capillary refill takes less than 2 seconds. No rash noted.  Psychiatric: He has a normal mood and affect. His behavior is normal.  Vitals reviewed.     Assessment & Plan:   Problem List Items Addressed This Visit      Cardiovascular and Mediastinum   Chronic atrial fibrillation ([place])    Managed by cardiology Anticoagulated Currently rate controlled Given his A. fib, we will carefully reduce his metoprolol dose to avoid RVR      Relevant Orders   Digoxin level   Hypotension - Primary    Currently hypotensive and fatigued No symptoms to suggest any illness that is causing this Likely related to  medications Decrease metoprolol to 25 mg twice daily At his upcoming follow-up with cardiology next week, could consider discontinuing pending pulse control with known chronic A. fib      Relevant Orders   Digoxin level     Endocrine   Adult hypothyroidism    TSH slightly elevated when last checked in 10/2017 Synthroid dose was not adjusted at this time  Now that patient is symptomatic with fatigue, will recheck TSH and consider dose adjustment Given chronic a fib, would want to avoid low TSH      Relevant Orders   TSH       Return if symptoms worsen or fail to improve.   The entirety of the  information documented in the History of Present Illness, Review of Systems and Physical Exam were personally obtained by me. Portions of this information were initially documented by Raquel Sarna Ratchford, CMA and reviewed by me for thoroughness and accuracy.    Virginia Crews, MD, MPH [facility] 02/13/2018 3:02 PM

## 2018-02-13 NOTE — Telephone Encounter (Signed)
New Message:   Please call,questions about his Lanoxin.

## 2018-02-13 NOTE — Assessment & Plan Note (Signed)
TSH slightly elevated when last checked in 10/2017 Synthroid dose was not adjusted at this time  Now that patient is symptomatic with fatigue, will recheck TSH and consider dose adjustment Given chronic a fib, would want to avoid low TSH

## 2018-02-13 NOTE — Assessment & Plan Note (Signed)
Managed by cardiology Anticoagulated Currently rate controlled Given his A. fib, we will carefully reduce his metoprolol dose to avoid RVR

## 2018-02-13 NOTE — Assessment & Plan Note (Signed)
Currently hypotensive and fatigued No symptoms to suggest any illness that is causing this Likely related to medications Decrease metoprolol to 25 mg twice daily At his upcoming follow-up with cardiology next week, could consider discontinuing pending pulse control with known chronic A. fib

## 2018-02-14 ENCOUNTER — Telehealth: Payer: Self-pay

## 2018-02-14 LAB — DIGOXIN LEVEL

## 2018-02-14 LAB — TSH: TSH: 3.12 u[IU]/mL (ref 0.450–4.500)

## 2018-02-14 NOTE — Telephone Encounter (Signed)
Pt's wife advised. She expresses understanding.

## 2018-02-14 NOTE — Telephone Encounter (Signed)
-----   Message from Virginia Crews, MD sent at 02/14/2018 11:23 AM EDT ----- Digoxin level is actually low, so there is no risk of digoxin toxicity.  Thyroid function is currently normal.  No medication changes  Bacigalupo, Dionne Bucy, MD, MPH [facility] 02/14/2018 11:23 AM

## 2018-02-19 ENCOUNTER — Encounter: Payer: Self-pay | Admitting: Nurse Practitioner

## 2018-02-19 ENCOUNTER — Ambulatory Visit: Payer: PPO | Admitting: Nurse Practitioner

## 2018-02-19 ENCOUNTER — Ambulatory Visit (INDEPENDENT_AMBULATORY_CARE_PROVIDER_SITE_OTHER): Payer: PPO

## 2018-02-19 VITALS — BP 100/60 | HR 91 | Ht 69.0 in | Wt 186.0 lb

## 2018-02-19 DIAGNOSIS — I482 Chronic atrial fibrillation, unspecified: Secondary | ICD-10-CM

## 2018-02-19 DIAGNOSIS — I4892 Unspecified atrial flutter: Secondary | ICD-10-CM

## 2018-02-19 DIAGNOSIS — R0602 Shortness of breath: Secondary | ICD-10-CM | POA: Diagnosis not present

## 2018-02-19 DIAGNOSIS — I4891 Unspecified atrial fibrillation: Secondary | ICD-10-CM | POA: Diagnosis not present

## 2018-02-19 DIAGNOSIS — I350 Nonrheumatic aortic (valve) stenosis: Secondary | ICD-10-CM

## 2018-02-19 DIAGNOSIS — Z5181 Encounter for therapeutic drug level monitoring: Secondary | ICD-10-CM

## 2018-02-19 DIAGNOSIS — I421 Obstructive hypertrophic cardiomyopathy: Secondary | ICD-10-CM

## 2018-02-19 LAB — POCT INR: INR: 1.9

## 2018-02-19 NOTE — Patient Instructions (Signed)
Please take 1 whole tablet tomorrow (whole tablet 3 days this week), then continue dosage of 1/2 tablet everyday except 1 tablet on SUNDAYS, [place]. Please be consistent with your greens intake - pick a day each week to have your greens and have them on that same day each week. Recheck in 3 weeks (after you return from the beach).

## 2018-02-19 NOTE — Progress Notes (Signed)
Office Visit    Patient Name: [name] [name] Date of Encounter: 02/19/2018  Primary Care Provider:  [name], MD Primary Cardiologist:  No primary care provider on file.  Chief Complaint    84 y/o ? with a history of chronic atrial fibrillation, aortic stenosis, hypertrophic obstructive cardia myopathy, hypertension, hyperlipidemia, type 2 diabetes, sleep apnea, and hypothyroidism, who presents for follow-up related to dyspnea and fatigue.  Past Medical History    Past Medical History:  Diagnosis Date  . Atherosclerosis of aorta ([place])    by CT scan in past  . Atrial fibrillation ([place])    and aflutter. pt has a left atrial circuit that is not ablated. was on amiodarone-stopped, now use rate control.   . Bladder cancer ([place])    hx; treated with BCG in past   . Carotid artery disease ([place])    There was calcified plaque but no stenosis by carotid artery screening done at [facility] October, 2013  . Diabetes mellitus    type not specified  . Diabetic neuropathy ([facility])    feet  . Elevated liver enzymes    over time; hx  . Excessive sweating   . Glaucoma   . Gout   . Head injury    when slipped on ice 2004-2005. stabilized and back on Coumadin   . Headache    migraines - distant past  . HOCM (hypertrophic obstructive cardiomyopathy) ([place])    a. 01/2016 Echo: EF 65-70%, no rwma, LVOT gradient of 80-4mmHg, mod AS, SAM, sev dil LA, PASP 58mmHg; b. 11/2017 Echo: EF 55-60%, near cavity obliteration in systole, mod AS, mild MS, mod to sev dil LA, nl RV fxn, PASP 55mmHg.  [name] Homocystinemia ([place])    elevated, mild   . Hypercholesterolemia    treated.   . Hypertension   . Kidney mass    laproscopic surgery woth cryoablation of a mass outside kidney followed at [facility].   . Moderate aortic stenosis    a. 01/2016 echo: mod AS.   [name] Motion sickness    ocean boats  . Nausea   . Obstructive sleep apnea    CPAP started successfully 2014  . Orthostatic  hypotension    a. orthostatic. dehydration. hospitalized 11/11  . Sleep apnea    Significant obstructive sleep apnea diagnosed in August, 2012, the patient is to receive CPAP   . Stroke [facility])    "2 old strokes" CT and MRI. [facility] 11/11. diagnosis was dehydration, no acutal reports.   . TSH elevation    on synthroid historically   . Unsteady gait    August, 2012   Past Surgical History:  Procedure Laterality Date  . BLADDER SURGERY    . CATARACT EXTRACTION W/PHACO Left 05/11/2015   Procedure: CATARACT EXTRACTION PHACO AND INTRAOCULAR LENS PLACEMENT (IOC);  Surgeon: Leandrew Koyanagi, MD;  Location: [facility];  Service: Ophthalmology;  Laterality: Left;  DIABETIC - oral meds, CPAP  . KIDNEY SURGERY     "froze mass"  . TONSILLECTOMY      Allergies  Allergies  Allergen Reactions  . Macrolides And Ketolides Other (See Comments)  . Mycinettes   . Nitrofuran Derivatives Other (See Comments)  . Nitrofurantoin Other (See Comments)  . Erythromycin Itching and Rash    Other reaction(s): UNKNOWN  And red skin    History of Present Illness    84 year old male with the above complex past medical history including chronic atrial fibrillation, moderate aortic  stenosis, HOCM,, hyperlipidemia, hypertension, type 2 diabetes mellitus, sleep apnea, and hypothyroidism.  In late 2018, he was admitted for respiratory failure and pneumonia.  He says his recovery from that has been very slow.  He has been experiencing fatigue and some degree of dyspnea on exertion ever since then.  When he was seen in clinic in January, he was switched off of verapamil and onto metoprolol in the setting of mildly elevated heart rates.  This was followed by echocardiogram in February which showed normal LV systolic function, stable moderate aortic stenosis, and near cavity obliteration in systole related to his HOCM.  When he followed up in March, heart rates were better and he was symptomatically  stable, but noted that dyspnea was not back to baseline.  Since his last visit, he is continued to have dyspnea on exertion, especially when walking up hills.  His wife thinks this is somewhat worse, the patient says it is pretty much stable.  He has not had any significant weight gain and denies chest pain, PND, orthopnea, dizziness, syncope, edema, or early satiety.  Recently, he has also noted low blood pressures.  He was seen by primary care last week and metoprolol dose was reduced from 50 mg twice daily to 25 twice daily.  Since then, blood pressures have been trending in the low 100s at home.  He has had some fatigue with this.  Home Medications    Prior to Admission medications   Medication Sig Start Date End Date Taking? Authorizing Provider  albuterol (PROVENTIL HFA;VENTOLIN HFA) 108 (90 Base) MCG/ACT inhaler Inhale 2 puffs into the lungs every 6 (six) hours as needed for wheezing or shortness of breath. 09/21/17  Yes Gouru, Illene Silver, MD  Calcium-Vitamin D 600-200 MG-UNIT per tablet Take 1 tablet by mouth daily.     Yes [provider]  co-enzyme Q-10 30 MG capsule Take 30 mg by mouth daily.     Yes [provider]  digoxin (LANOXIN) 0.125 MG tablet TAKE 1 TABLET BY MOUTH ONCE DAILY 12/25/17  Yes Wellington Hampshire, MD  fish oil-omega-3 fatty acids 1000 MG capsule Take 2 g by mouth 2 (two) times daily.     Yes [provider]  Gelatin Capsules, Empty, CAPS 2 capsules by Does not apply route 2 (two) times daily.    Yes [provider]  Glucosamine 500 MG CAPS Take 1 capsule by mouth daily.     Yes [provider]  glucose blood (ONE TOUCH ULTRA TEST) test strip USE ONE STRIP TO CHECK GLUCOSE ONCE DAILY 03/25/17  Yes [name], MD  latanoprost (XALATAN) 0.005 % ophthalmic solution Place 1 drop into both eyes at bedtime.  11/13/13  Yes [provider]  levothyroxine (SYNTHROID, LEVOTHROID) 50 MCG tablet TAKE ONE TABLET BY MOUTH ONCE  DAILY 06/05/17  Yes [name], MD  metFORMIN (GLUCOPHAGE) 500 MG tablet TAKE 1 TABLET BY MOUTH ONCE DAILY 12/25/17  Yes [name], MD  metoprolol tartrate (LOPRESSOR) 50 MG tablet Take 1/2 tablet (25 mg) by mouth twice daily   Yes [provider]  [facility] DELICA LANCETS 74J MISC USE ONE LANCET TO CHECK BLOOD GLUCOSE ONCE DAILY 03/25/17  Yes [name], MD  POTASSIUM PO Take 1 tablet by mouth daily.     Yes [provider]  simvastatin (ZOCOR) 40 MG tablet Take 0.5 tablets (20 mg total) by mouth at bedtime. 04/29/17  Yes Wellington Hampshire, MD  vitamin B-12 (CYANOCOBALAMIN) 500 MCG tablet Take 500 mcg by mouth daily.     Yes [provider]  warfarin (COUMADIN) 5 MG tablet TAKE AS DIRECTED BY  COUMADIN  CLINIC Patient taking differently: Take 5 mg by mouth daily. TAKE AS DIRECTED BY  COUMADIN  CLINIC 08/05/17  Yes Wellington Hampshire, MD    Review of Systems    Fatigue and dyspnea on exertion as outlined above.  Based on patient's description, the sound to be stable he denies chest pain, PND, orthopnea, dizziness, syncope, edema, or early satiety.  All other systems reviewed and are otherwise negative except as noted above.  Physical Exam    VS:  BP 100/60 (BP Location: Left Arm, Patient Position: Sitting, Cuff Size: Normal)   Pulse 91   Ht 5\' 9"  (1.753 m)   Wt 186 lb (84.4 kg)   BMI 27.47 kg/m  , BMI Body mass index is 27.47 kg/m. GEN: Well nourished, well developed, in no acute distress.  HEENT: normal.  Neck: Supple, no JVD, carotid bruits, or masses. Cardiac: Irregularly irregular, 2/6 systolic murmur at the right upper sternal border, 3/6 systolic murmur at the left lower sternal border to apex, no rubs, or gallops. No clubbing, cyanosis, edema.  Radials/DP/PT 2+ and equal bilaterally.  Respiratory:  Respirations regular and unlabored, clear to auscultation bilaterally. GI: Soft, nontender, nondistended, BS + x 4. MS: no  deformity or atrophy. Skin: warm and dry, no rash. Neuro:  Strength and sensation are intact. Psych: Normal affect.  Accessory Clinical Findings    ECG -atrial fibrillation, 91, leftward axis, LVH.  Assessment & Plan    1.  Hypertrophic obstructive cardiomyopathy: Stable by recent echo.  He has been experiencing shortness of breath ever since pneumonia in December 2018.  His volume status has been stable and he has not had any presyncope or syncope.  His wife is concerned regarding his ongoing dyspnea and expressed an interest for potential further evaluation and mgmt of HOCM @ [facility], if Dr. Fletcher Anon was agreeable.  We discussed the role that A. fib, especially A. fib with higher rates, is likely playing in his symptoms.  With soft blood pressures, I am not able to go back up on his metoprolol today.  I advised that I would discuss potential referral with Dr. Fletcher Anon.  I encouraged him to ensure adequate hydration throughout the day.  Given fatigue and dyspnea, I will follow-up a CBC and basic metabolic panel today.  He recently had a normal digoxin level and TSH.  2.  Permanent atrial fibrillation: Rates are in the low 90s in the setting of recent reduction in metoprolol dose.  This was reduced secondary to relative hypotension.  Blood pressures 100/60 today.  Continue current dose of metoprolol and digoxin.  Anticoagulated with Coumadin and followed in our Coumadin clinic.  3.  Moderate aortic stenosis: Stable on recent echo.  4.  Hyperlipidemia: He remains on simvastatin 20 mg.  5.  Type 2 diabetes mellitus: On metformin.  Followed by primary care.  6.  Dispo:  We discussed his case for 40 mins today.  F/u labs today.  F/u here in 2-3 months and I will reach out to Dr. Fletcher Anon re: potential referral to [facility]/additional HOCM w/u.  Murray Hodgkins, NP 02/19/2018, 1:18 PM

## 2018-02-19 NOTE — Patient Instructions (Addendum)
Medication Instructions: - Your physician recommends that you continue on your current medications as directed. Please refer to the Current Medication list given to you today.  Labwork: - Your physician recommends that you have lab work today: BMP/ CBC  Procedures/Testing: - none ordered  Follow-Up: - Your physician recommends that you schedule a follow-up appointment in: 3 months with Dr. Fletcher Anon.    Any Additional Special Instructions Will Be Listed Below (If Applicable). - you have been given an application for a disability parking placard today    If you need a refill on your cardiac medications before your next appointment, please call your pharmacy.

## 2018-02-20 LAB — CBC WITH DIFFERENTIAL/PLATELET
BASOS: 1 %
Basophils Absolute: 0.1 10*3/uL (ref 0.0–0.2)
EOS (ABSOLUTE): 0.3 10*3/uL (ref 0.0–0.4)
EOS: 4 %
HEMATOCRIT: 38.4 % (ref 37.5–51.0)
HEMOGLOBIN: 12.6 g/dL — AB (ref 13.0–17.7)
Immature Grans (Abs): 0 10*3/uL (ref 0.0–0.1)
Immature Granulocytes: 0 %
LYMPHS ABS: 2.5 10*3/uL (ref 0.7–3.1)
Lymphs: 31 %
MCH: 29.8 pg (ref 26.6–33.0)
MCHC: 32.8 g/dL (ref 31.5–35.7)
MCV: 91 fL (ref 79–97)
MONOCYTES: 7 %
Monocytes Absolute: 0.5 10*3/uL (ref 0.1–0.9)
NEUTROS ABS: 4.5 10*3/uL (ref 1.4–7.0)
Neutrophils: 57 %
Platelets: 230 10*3/uL (ref 150–379)
RBC: 4.23 x10E6/uL (ref 4.14–5.80)
RDW: 16.1 % — ABNORMAL HIGH (ref 12.3–15.4)
WBC: 8 10*3/uL (ref 3.4–10.8)

## 2018-02-20 LAB — BASIC METABOLIC PANEL
BUN / CREAT RATIO: 26 — AB (ref 10–24)
BUN: 25 mg/dL (ref 8–27)
CALCIUM: 9.5 mg/dL (ref 8.6–10.2)
CO2: 19 mmol/L — ABNORMAL LOW (ref 20–29)
CREATININE: 0.95 mg/dL (ref 0.76–1.27)
Chloride: 101 mmol/L (ref 96–106)
GFR calc non Af Amer: 74 mL/min/{1.73_m2} (ref >59)
GFR, EST AFRICAN AMERICAN: 86 mL/min/{1.73_m2} (ref >59)
GLUCOSE: 234 mg/dL — AB (ref 65–99)
Potassium: 4.7 mmol/L (ref 3.5–5.2)
Sodium: 136 mmol/L (ref 134–144)

## 2018-02-21 ENCOUNTER — Telehealth: Payer: Self-pay

## 2018-02-21 NOTE — Telephone Encounter (Signed)
-----   Message from Theora Gianotti, NP sent at 02/20/2018  7:57 AM EDT ----- Kidney fxn, electrolytes, and blood counts stable.  I spoke with Dr. Fletcher Anon who agreed w/ referral to Dr. Derinda Sis @ [facility] for further eval of HOCM given increase in symptoms.  Please help arrange referral.

## 2018-02-21 NOTE — Telephone Encounter (Signed)
Pt and pt's wife aware of lab results with verbal understanding. Adv them that Nira Conn, RN will be working on the pt's referral to Dr.Wang @ [facility] pt verbalized understanding and voiced appreciation for the call.

## 2018-03-11 NOTE — Telephone Encounter (Signed)
I called and spoke with the patient and his wife. I have called and spoken with Dr. Leland Her office at [facility] and the patient is scheduled for an appointment on Monday 04/21/18 at 8:00 am with Dr. Mina Marble. I have advised the patient's wife to call [phone] if she needs to reschedule his appointment.  The patient's wife is agreeable and verbalizes understanding.   Records need to be faxed to [place] at [phone].  A copy of the patient's echo needs to put on a disc and mailed to: [place]  [place], [place] [address]

## 2018-03-11 NOTE — Telephone Encounter (Signed)
Patient has not received a call back about the referral appt. Please advise.

## 2018-03-12 ENCOUNTER — Ambulatory Visit (INDEPENDENT_AMBULATORY_CARE_PROVIDER_SITE_OTHER): Payer: PPO

## 2018-03-12 DIAGNOSIS — I482 Chronic atrial fibrillation, unspecified: Secondary | ICD-10-CM

## 2018-03-12 DIAGNOSIS — I4892 Unspecified atrial flutter: Secondary | ICD-10-CM

## 2018-03-12 DIAGNOSIS — I4891 Unspecified atrial fibrillation: Secondary | ICD-10-CM | POA: Diagnosis not present

## 2018-03-12 DIAGNOSIS — L720 Epidermal cyst: Secondary | ICD-10-CM | POA: Diagnosis not present

## 2018-03-12 DIAGNOSIS — Z5181 Encounter for therapeutic drug level monitoring: Secondary | ICD-10-CM

## 2018-03-12 DIAGNOSIS — L821 Other seborrheic keratosis: Secondary | ICD-10-CM | POA: Diagnosis not present

## 2018-03-12 DIAGNOSIS — Z872 Personal history of diseases of the skin and subcutaneous tissue: Secondary | ICD-10-CM | POA: Diagnosis not present

## 2018-03-12 DIAGNOSIS — L578 Other skin changes due to chronic exposure to nonionizing radiation: Secondary | ICD-10-CM | POA: Diagnosis not present

## 2018-03-12 DIAGNOSIS — Z86018 Personal history of other benign neoplasm: Secondary | ICD-10-CM | POA: Diagnosis not present

## 2018-03-12 LAB — POCT INR: INR: 1.6 — AB (ref 2.0–3.0)

## 2018-03-12 NOTE — Patient Instructions (Signed)
Please take 2 tablets today, then continue dosage of 1/2 tablet everyday except 1 tablet on SUNDAYS, [place]. Please be consistent with your greens intake - pick a day each week to have your greens and have them on that same day each week. Recheck in 2 weeks.

## 2018-03-19 NOTE — Telephone Encounter (Signed)
The patient's records were faxed to Dr. Mina Marble at [phone]. Confirmation received.  A copy of his most recent echo was received on a disc yesterday. I gave this to Sabrina to send by Fed[employer]Ex to Dr. Mina Marble. She was given the mailing address to send this to as well.

## 2018-03-20 NOTE — Telephone Encounter (Signed)
Delivered disc of the patient's echo to supply chain for mailing to [employer]. Address given to supply chain along with disc was: [place] [place], [place] [address]

## 2018-03-24 ENCOUNTER — Ambulatory Visit (INDEPENDENT_AMBULATORY_CARE_PROVIDER_SITE_OTHER): Payer: PPO

## 2018-03-24 DIAGNOSIS — I482 Chronic atrial fibrillation, unspecified: Secondary | ICD-10-CM

## 2018-03-24 DIAGNOSIS — Z5181 Encounter for therapeutic drug level monitoring: Secondary | ICD-10-CM | POA: Diagnosis not present

## 2018-03-24 DIAGNOSIS — I4892 Unspecified atrial flutter: Secondary | ICD-10-CM | POA: Diagnosis not present

## 2018-03-24 DIAGNOSIS — I4891 Unspecified atrial fibrillation: Secondary | ICD-10-CM | POA: Diagnosis not present

## 2018-03-24 LAB — POCT INR: INR: 2.4 (ref 2.0–3.0)

## 2018-03-24 NOTE — Patient Instructions (Signed)
Please take continue dosage of 1/2 tablet everyday except 1 tablet on SUNDAYS, [place]. Please be consistent with your greens intake - pick a day each week to have your greens and have them on that same day each week. Recheck in 3 weeks.

## 2018-04-14 ENCOUNTER — Ambulatory Visit (INDEPENDENT_AMBULATORY_CARE_PROVIDER_SITE_OTHER): Payer: PPO

## 2018-04-14 DIAGNOSIS — I4891 Unspecified atrial fibrillation: Secondary | ICD-10-CM

## 2018-04-14 DIAGNOSIS — I482 Chronic atrial fibrillation, unspecified: Secondary | ICD-10-CM

## 2018-04-14 DIAGNOSIS — Z5181 Encounter for therapeutic drug level monitoring: Secondary | ICD-10-CM

## 2018-04-14 DIAGNOSIS — I4892 Unspecified atrial flutter: Secondary | ICD-10-CM | POA: Diagnosis not present

## 2018-04-14 LAB — POCT INR: INR: 3 (ref 2.0–3.0)

## 2018-04-14 NOTE — Patient Instructions (Signed)
Please skip coumadin tonight, then continue dosage of 1/2 tablet everyday except 1 tablet on SUNDAYS, [place]. Please be consistent with your greens intake - pick a day each week to have your greens and have them on that same day each week. Recheck in 3 weeks.

## 2018-04-21 DIAGNOSIS — Q248 Other specified congenital malformations of heart: Secondary | ICD-10-CM | POA: Diagnosis not present

## 2018-04-21 DIAGNOSIS — I4891 Unspecified atrial fibrillation: Secondary | ICD-10-CM | POA: Insufficient documentation

## 2018-04-21 DIAGNOSIS — I342 Nonrheumatic mitral (valve) stenosis: Secondary | ICD-10-CM | POA: Diagnosis not present

## 2018-04-21 DIAGNOSIS — I482 Chronic atrial fibrillation: Secondary | ICD-10-CM | POA: Diagnosis not present

## 2018-04-21 DIAGNOSIS — I350 Nonrheumatic aortic (valve) stenosis: Secondary | ICD-10-CM | POA: Diagnosis not present

## 2018-04-30 ENCOUNTER — Telehealth: Payer: Self-pay | Admitting: Family Medicine

## 2018-04-30 ENCOUNTER — Ambulatory Visit: Payer: PPO

## 2018-04-30 DIAGNOSIS — I4892 Unspecified atrial flutter: Secondary | ICD-10-CM | POA: Diagnosis not present

## 2018-04-30 DIAGNOSIS — I482 Chronic atrial fibrillation, unspecified: Secondary | ICD-10-CM

## 2018-04-30 DIAGNOSIS — Z5181 Encounter for therapeutic drug level monitoring: Secondary | ICD-10-CM | POA: Diagnosis not present

## 2018-04-30 DIAGNOSIS — I4891 Unspecified atrial fibrillation: Secondary | ICD-10-CM | POA: Diagnosis not present

## 2018-04-30 LAB — POCT INR: INR: 3.3 — AB (ref 2.0–3.0)

## 2018-04-30 NOTE — Telephone Encounter (Signed)
pt called to let Dr. Rosanna Randy know that Dr. Sophronia Simas has sent [facility] for a procedure (heart cath).  He just wanted to make sure that Dr. Darnell Level was aware of this.  Thanks [employer]

## 2018-04-30 NOTE — Patient Instructions (Signed)
Please skip coumadin tonight, then continue dosage of 1/2 tablet everyday except 1 tablet on SUNDAYS, [place]. Please be consistent with your greens intake - pick a day each week to have your greens and have them on that same day each week.  In preparation for colonoscopy on Wed, August 7, please hold your coumadin x 5 days prior to your procedure. IF OK'D BY PERFORMING MD, RESUME COUMADIN ON NIGHT OF PROCEDURE W/ EXTRA 1/2 TABLET FOR 2 DAYS, then resume previous dosing. Recheck 1 week after cath.

## 2018-04-30 NOTE — Telephone Encounter (Signed)
Please review. Thanks!  

## 2018-05-02 ENCOUNTER — Other Ambulatory Visit: Payer: Self-pay | Admitting: Family Medicine

## 2018-05-04 ENCOUNTER — Other Ambulatory Visit: Payer: Self-pay | Admitting: Cardiovascular Disease

## 2018-05-12 DIAGNOSIS — B351 Tinea unguium: Secondary | ICD-10-CM | POA: Diagnosis not present

## 2018-05-12 DIAGNOSIS — Q248 Other specified congenital malformations of heart: Secondary | ICD-10-CM | POA: Diagnosis not present

## 2018-05-12 DIAGNOSIS — E119 Type 2 diabetes mellitus without complications: Secondary | ICD-10-CM | POA: Diagnosis not present

## 2018-05-15 HISTORY — PX: MECHANICAL AORTIC AND MITRAL VALVE REPLACEMENT: SHX2012

## 2018-05-21 DIAGNOSIS — Z5181 Encounter for therapeutic drug level monitoring: Secondary | ICD-10-CM | POA: Diagnosis not present

## 2018-05-21 DIAGNOSIS — Z7901 Long term (current) use of anticoagulants: Secondary | ICD-10-CM | POA: Diagnosis not present

## 2018-05-21 DIAGNOSIS — Q248 Other specified congenital malformations of heart: Secondary | ICD-10-CM | POA: Diagnosis not present

## 2018-05-21 DIAGNOSIS — I272 Pulmonary hypertension, unspecified: Secondary | ICD-10-CM | POA: Diagnosis not present

## 2018-05-21 DIAGNOSIS — I422 Other hypertrophic cardiomyopathy: Secondary | ICD-10-CM | POA: Diagnosis not present

## 2018-05-21 DIAGNOSIS — I482 Chronic atrial fibrillation: Secondary | ICD-10-CM | POA: Diagnosis not present

## 2018-05-21 DIAGNOSIS — I2510 Atherosclerotic heart disease of native coronary artery without angina pectoris: Secondary | ICD-10-CM | POA: Diagnosis not present

## 2018-05-21 DIAGNOSIS — I350 Nonrheumatic aortic (valve) stenosis: Secondary | ICD-10-CM | POA: Diagnosis not present

## 2018-05-23 ENCOUNTER — Ambulatory Visit: Payer: PPO | Admitting: Cardiovascular Disease

## 2018-05-23 ENCOUNTER — Encounter: Payer: Self-pay | Admitting: Cardiovascular Disease

## 2018-05-23 VITALS — BP 110/60 | HR 100 | Ht 69.0 in | Wt 189.5 lb

## 2018-05-23 DIAGNOSIS — E785 Hyperlipidemia, unspecified: Secondary | ICD-10-CM

## 2018-05-23 DIAGNOSIS — I421 Obstructive hypertrophic cardiomyopathy: Secondary | ICD-10-CM | POA: Diagnosis not present

## 2018-05-23 DIAGNOSIS — I350 Nonrheumatic aortic (valve) stenosis: Secondary | ICD-10-CM

## 2018-05-23 DIAGNOSIS — I482 Chronic atrial fibrillation, unspecified: Secondary | ICD-10-CM

## 2018-05-23 NOTE — Patient Instructions (Signed)
Medication Instructions: Your physician recommends that you continue on your current medications as directed. Please refer to the Current Medication list given to you today.  If you need a refill on your cardiac medications before your next appointment, please call your pharmacy.   Follow-Up: Your physician wants you to follow-up in 3 months with Dr. Arida.   Thank you for choosing [facility]!    

## 2018-05-23 NOTE — Progress Notes (Signed)
Cardiology Office Note   Date:  05/23/2018   ID:  [name] [name], [name] [name] Mar 23, 1934, MRN [id number]  PCP:  [name], MD  Cardiologist:   Kathlyn Sacramento, MD   Chief Complaint  Patient presents with  . other    3 month follow up. Meds reviewed by the pt. verbally. Pt. c/o shortness of breath. Pt. was at [facility] for a cardiac cath & is now scheduled for open heart surgery that is scheduled for next Friday, Aug. 16, 2019.       History of Present Illness: [name] [name] is a 84 y.o. male who presents for for a follow-up visit regarding chronic atrial fibrillation, hypertrophic obstructive cardiomyopathy and aortic stenosis.   He is active. He volunteers regularly at [facility].  He was hospitalized in December, 2018 with pneumonia.  He improved with antibiotics.  He had issues with tachycardia and hypotension while hospitalized.  The patient had worsening shortness of breath and fatigue since that time.  He was switched from verapamil to metoprolol for better rate control.  Echocardiogram showed normal LV systolic function,  moderate aortic stenosis, mild mitral stenosis and mild pulmonary hypertension with peak systolic pressure of 47 mmHg.  LVOT gradient could not be estimated.  Due to patient's continued symptoms, we elected to refer him to Dr. Jacelyn Grip at [facility].  The patient underwent a right and left cardiac catheterization.  It showed no significant coronary artery disease.  I do not have the full cath report.  He did undergo cardiac MRI which showed hyperdynamic LV systolic function with severe septal hypertrophy, severely dilated left atrium, severe aortic stenosis and severe mitral regurgitation.  The patient is scheduled for surgery next week at [facility] including myomectomy, aortic valve replacement and possible mitral valve repair or replacement.  Past Medical History:  Diagnosis Date  . Atherosclerosis of aorta ([place])    by CT scan in past  . Atrial fibrillation  ([place])    and aflutter. pt has a left atrial circuit that is not ablated. was on amiodarone-stopped, now use rate control.   . Bladder cancer ([place])    hx; treated with BCG in past   . Carotid artery disease ([place])    There was calcified plaque but no stenosis by carotid artery screening done at [facility] October, 2013  . Diabetes mellitus    type not specified  . Diabetic neuropathy (HCC)    feet  . Elevated liver enzymes    over time; hx  . Excessive sweating   . Glaucoma   . Gout   . Head injury    when slipped on ice 2004-2005. stabilized and back on Coumadin   . Headache    migraines - distant past  . HOCM (hypertrophic obstructive cardiomyopathy) ([place])    a. 01/2016 Echo: EF 65-70%, no rwma, LVOT gradient of 80-61mmHg, mod AS, SAM, sev dil LA, PASP 10mmHg; b. 11/2017 Echo: EF 55-60%, near cavity obliteration in systole, mod AS, mild MS, mod to sev dil LA, nl RV fxn, PASP 56mmHg.  [name] Homocystinemia ([place])    elevated, mild   . Hypercholesterolemia    treated.   . Hypertension   . Kidney mass    laproscopic surgery woth cryoablation of a mass outside kidney followed at [facility].   . Moderate aortic stenosis    a. 01/2016 echo: mod AS.   [name] Motion sickness    ocean boats  . Nausea   . Obstructive sleep apnea  CPAP started successfully 2014  . Orthostatic hypotension    a. orthostatic. dehydration. hospitalized 11/11  . Sleep apnea    Significant obstructive sleep apnea diagnosed in August, 2012, the patient is to receive CPAP   . Stroke [facility])    "2 old strokes" CT and MRI. [place] hospital 11/11. diagnosis was dehydration, no acutal reports.   . TSH elevation    on synthroid historically   . Unsteady gait    August, 2012    Past Surgical History:  Procedure Laterality Date  . BLADDER SURGERY    . CATARACT EXTRACTION W/PHACO Left 05/11/2015   Procedure: CATARACT EXTRACTION PHACO AND INTRAOCULAR LENS PLACEMENT (IOC);  Surgeon: Leandrew Koyanagi, MD;   Location: [facility];  Service: Ophthalmology;  Laterality: Left;  DIABETIC - oral meds, CPAP  . KIDNEY SURGERY     "froze mass"  . TONSILLECTOMY       Current Outpatient Medications  Medication Sig Dispense Refill  . albuterol (PROVENTIL HFA;VENTOLIN HFA) 108 (90 Base) MCG/ACT inhaler Inhale 2 puffs into the lungs every 6 (six) hours as needed for wheezing or shortness of breath. 1 Inhaler 1  . Calcium-Vitamin D 600-200 MG-UNIT per tablet Take 1 tablet by mouth daily.      [name] co-enzyme Q-10 30 MG capsule Take 30 mg by mouth daily.      . digoxin (LANOXIN) 0.125 MG tablet TAKE 1 TABLET BY MOUTH ONCE DAILY 90 tablet 3  . fish oil-omega-3 fatty acids 1000 MG capsule Take 2 g by mouth 2 (two) times daily.      . Gelatin Capsules, Empty, CAPS 2 capsules by Does not apply route 2 (two) times daily.     . Glucosamine 500 MG CAPS Take 1 capsule by mouth daily.      [name] glucose blood (ONE TOUCH ULTRA TEST) test strip USE ONE STRIP TO CHECK GLUCOSE ONCE DAILY 100 each 12  . latanoprost (XALATAN) 0.005 % ophthalmic solution Place 1 drop into both eyes at bedtime.     [name] levothyroxine (SYNTHROID, LEVOTHROID) 50 MCG tablet TAKE ONE TABLET BY MOUTH ONCE DAILY 90 tablet 3  . metFORMIN (GLUCOPHAGE) 500 MG tablet TAKE 1 TABLET BY MOUTH ONCE DAILY 90 tablet 3  . metoprolol tartrate (LOPRESSOR) 50 MG tablet Take 1/2 tablet (25 mg) by mouth twice daily    . ONE TOUCH ULTRA TEST test strip USE TO TEST BLOOD SUGAR ONCE DAILY 50 each 25  . ONETOUCH DELICA LANCETS 62B MISC USE ONE LANCET TO CHECK BLOOD GLUCOSE ONCE DAILY 100 each 12  . ONETOUCH DELICA LANCETS 76E MISC USE TO TEST BLOOD SUGAR ONCE DAILY 100 each 12  . POTASSIUM PO Take 1 tablet by mouth daily.      . simvastatin (ZOCOR) 40 MG tablet TAKE 1/2 (ONE-HALF) TABLET BY MOUTH AT BEDTIME 45 tablet 3  . vitamin B-12 (CYANOCOBALAMIN) 500 MCG tablet Take 500 mcg by mouth daily.      [name] warfarin (COUMADIN) 5 MG tablet TAKE AS DIRECTED BY  COUMADIN  CLINIC  (Patient not taking: Reported on 05/23/2018) 90 tablet 1   No current facility-administered medications for this visit.     Allergies:   Macrolides and ketolides; Mycinettes; Nitrofuran derivatives; Nitrofurantoin; and Erythromycin    Social History:  The patient  reports that he quit smoking about 43 years ago. His smoking use included cigars and cigarettes. He has never used smokeless tobacco. He reports that he does not drink alcohol or use drugs.   Family  History:  The patient's family history includes Arrhythmia in his brother and father.    ROS:  Please see the history of present illness.   Otherwise, review of systems are positive for none.   All other systems are reviewed and negative.    PHYSICAL EXAM: VS:  Ht 5\' 9"  (1.753 m)   Wt 189 lb 8 oz (86 kg)   BMI 27.98 kg/m  , BMI Body mass index is 27.98 kg/m. GEN: Well nourished, well developed, in no acute distress  HEENT: normal  Neck: no JVD, carotid bruits, or masses Cardiac: Irregularly irregular; no rubs, or gallops,no edema . There is 3/6 crescendo decrescendo murmur in the aortic area which is mid peaking with mildly diminished S2. Respiratory:  clear to auscultation bilaterally, normal work of breathing GI: soft, nontender, nondistended, + BS MS: no deformity or atrophy  Skin: warm and dry, no rash Neuro:  Strength and sensation are intact Psych: euthymic mood, full affect   EKG:  EKG is ordered today. The ekg ordered today demonstrates atrial fibrillation, left ventricular hypertrophy with repolarization abnormalities.  Ventricular rate is 100 bpm.   Recent Labs: 11/12/2017: ALT 23 02/13/2018: TSH 3.120 02/19/2018: BUN 25; Creatinine, Ser 0.95; Hemoglobin 12.6; Platelets 230; Potassium 4.7; Sodium 136    Lipid Panel    Component Value Date/Time   CHOL 147 11/12/2017 0832   TRIG 120 11/12/2017 0832   HDL 41 11/12/2017 0832   CHOLHDL 4.1 12/10/2016 0953   LDLCALC 82 11/12/2017 0832      Wt Readings from  Last 3 Encounters:  05/23/18 189 lb 8 oz (86 kg)  02/19/18 186 lb (84.4 kg)  12/20/17 189 lb (85.7 kg)        ASSESSMENT AND PLAN:  1.  Chronic atrial fibrillation: Continue rate control with metoprolol and digoxin.  The patient was switched from warfarin to Eliquis by [facility] in anticipation for surgery.  I suspect this is to decrease the duration of being off anticoagulation.    2. Aortic stenosis: Severe on recent cardiac MRI: The patient is scheduled for replacement next week at [facility].  3. Hypertrophic obstructive cardiomyopathy: Significant LVOT gradient with severe mitral regurgitation due to systolic anterior motion.  The patient is scheduled for myomectomy and possible mitral valve surgery next week at [facility].  4. Hyperlipidemia currently on simvastatin 20 mg daily with a target LDL of less than 70 and diabetes.   Disposition:   FU with me in 3 months  Signed,  Kathlyn Sacramento, MD  05/23/2018 10:58 AM    [place]

## 2018-06-02 DIAGNOSIS — J310 Chronic rhinitis: Secondary | ICD-10-CM | POA: Diagnosis not present

## 2018-06-02 DIAGNOSIS — D72829 Elevated white blood cell count, unspecified: Secondary | ICD-10-CM | POA: Diagnosis not present

## 2018-06-02 DIAGNOSIS — E785 Hyperlipidemia, unspecified: Secondary | ICD-10-CM | POA: Diagnosis not present

## 2018-06-02 DIAGNOSIS — Z85528 Personal history of other malignant neoplasm of kidney: Secondary | ICD-10-CM | POA: Diagnosis not present

## 2018-06-02 DIAGNOSIS — Q899 Congenital malformation, unspecified: Secondary | ICD-10-CM | POA: Diagnosis not present

## 2018-06-02 DIAGNOSIS — I4891 Unspecified atrial fibrillation: Secondary | ICD-10-CM | POA: Diagnosis not present

## 2018-06-02 DIAGNOSIS — I442 Atrioventricular block, complete: Secondary | ICD-10-CM | POA: Diagnosis not present

## 2018-06-02 DIAGNOSIS — I4892 Unspecified atrial flutter: Secondary | ICD-10-CM | POA: Diagnosis not present

## 2018-06-02 DIAGNOSIS — N170 Acute kidney failure with tubular necrosis: Secondary | ICD-10-CM | POA: Diagnosis not present

## 2018-06-02 DIAGNOSIS — Z7901 Long term (current) use of anticoagulants: Secondary | ICD-10-CM | POA: Insufficient documentation

## 2018-06-02 DIAGNOSIS — I340 Nonrheumatic mitral (valve) insufficiency: Secondary | ICD-10-CM | POA: Diagnosis not present

## 2018-06-02 DIAGNOSIS — E1165 Type 2 diabetes mellitus with hyperglycemia: Secondary | ICD-10-CM | POA: Diagnosis not present

## 2018-06-02 DIAGNOSIS — I493 Ventricular premature depolarization: Secondary | ICD-10-CM | POA: Diagnosis not present

## 2018-06-02 DIAGNOSIS — R54 Age-related physical debility: Secondary | ICD-10-CM | POA: Diagnosis not present

## 2018-06-02 DIAGNOSIS — I080 Rheumatic disorders of both mitral and aortic valves: Secondary | ICD-10-CM | POA: Diagnosis not present

## 2018-06-02 DIAGNOSIS — I482 Chronic atrial fibrillation: Secondary | ICD-10-CM | POA: Diagnosis not present

## 2018-06-02 DIAGNOSIS — R918 Other nonspecific abnormal finding of lung field: Secondary | ICD-10-CM | POA: Diagnosis not present

## 2018-06-02 DIAGNOSIS — E119 Type 2 diabetes mellitus without complications: Secondary | ICD-10-CM | POA: Diagnosis not present

## 2018-06-02 DIAGNOSIS — E569 Vitamin deficiency, unspecified: Secondary | ICD-10-CM | POA: Diagnosis not present

## 2018-06-02 DIAGNOSIS — R570 Cardiogenic shock: Secondary | ICD-10-CM | POA: Diagnosis not present

## 2018-06-02 DIAGNOSIS — R410 Disorientation, unspecified: Secondary | ICD-10-CM | POA: Diagnosis not present

## 2018-06-02 DIAGNOSIS — E873 Alkalosis: Secondary | ICD-10-CM | POA: Diagnosis not present

## 2018-06-02 DIAGNOSIS — H40029 Open angle with borderline findings, high risk, unspecified eye: Secondary | ICD-10-CM | POA: Diagnosis not present

## 2018-06-02 DIAGNOSIS — Q248 Other specified congenital malformations of heart: Secondary | ICD-10-CM | POA: Diagnosis not present

## 2018-06-02 DIAGNOSIS — F05 Delirium due to known physiological condition: Secondary | ICD-10-CM | POA: Diagnosis not present

## 2018-06-02 DIAGNOSIS — R001 Bradycardia, unspecified: Secondary | ICD-10-CM | POA: Diagnosis not present

## 2018-06-02 DIAGNOSIS — I270 Primary pulmonary hypertension: Secondary | ICD-10-CM | POA: Diagnosis not present

## 2018-06-02 DIAGNOSIS — I421 Obstructive hypertrophic cardiomyopathy: Secondary | ICD-10-CM | POA: Diagnosis not present

## 2018-06-02 DIAGNOSIS — I050 Rheumatic mitral stenosis: Secondary | ICD-10-CM | POA: Diagnosis not present

## 2018-06-02 DIAGNOSIS — F17210 Nicotine dependence, cigarettes, uncomplicated: Secondary | ICD-10-CM | POA: Diagnosis not present

## 2018-06-02 DIAGNOSIS — R531 Weakness: Secondary | ICD-10-CM | POA: Diagnosis not present

## 2018-06-02 DIAGNOSIS — I509 Heart failure, unspecified: Secondary | ICD-10-CM | POA: Diagnosis not present

## 2018-06-02 DIAGNOSIS — R52 Pain, unspecified: Secondary | ICD-10-CM | POA: Diagnosis not present

## 2018-06-02 DIAGNOSIS — G4733 Obstructive sleep apnea (adult) (pediatric): Secondary | ICD-10-CM | POA: Diagnosis not present

## 2018-06-02 DIAGNOSIS — I2722 Pulmonary hypertension due to left heart disease: Secondary | ICD-10-CM | POA: Diagnosis not present

## 2018-06-02 DIAGNOSIS — I5020 Unspecified systolic (congestive) heart failure: Secondary | ICD-10-CM | POA: Diagnosis not present

## 2018-06-02 DIAGNOSIS — R0603 Acute respiratory distress: Secondary | ICD-10-CM | POA: Diagnosis not present

## 2018-06-02 DIAGNOSIS — I110 Hypertensive heart disease with heart failure: Secondary | ICD-10-CM | POA: Diagnosis not present

## 2018-06-02 DIAGNOSIS — I350 Nonrheumatic aortic (valve) stenosis: Secondary | ICD-10-CM | POA: Diagnosis not present

## 2018-06-02 DIAGNOSIS — J9600 Acute respiratory failure, unspecified whether with hypoxia or hypercapnia: Secondary | ICD-10-CM | POA: Diagnosis not present

## 2018-06-02 DIAGNOSIS — M6250 Muscle wasting and atrophy, not elsewhere classified, unspecified site: Secondary | ICD-10-CM | POA: Diagnosis not present

## 2018-06-02 DIAGNOSIS — I422 Other hypertrophic cardiomyopathy: Secondary | ICD-10-CM | POA: Diagnosis not present

## 2018-06-02 DIAGNOSIS — Z954 Presence of other heart-valve replacement: Secondary | ICD-10-CM | POA: Diagnosis not present

## 2018-06-02 DIAGNOSIS — G4700 Insomnia, unspecified: Secondary | ICD-10-CM | POA: Diagnosis not present

## 2018-06-02 DIAGNOSIS — Z9181 History of falling: Secondary | ICD-10-CM | POA: Diagnosis not present

## 2018-06-02 DIAGNOSIS — Z950 Presence of cardiac pacemaker: Secondary | ICD-10-CM | POA: Diagnosis not present

## 2018-06-02 DIAGNOSIS — E039 Hypothyroidism, unspecified: Secondary | ICD-10-CM | POA: Diagnosis not present

## 2018-06-02 DIAGNOSIS — R0989 Other specified symptoms and signs involving the circulatory and respiratory systems: Secondary | ICD-10-CM | POA: Diagnosis not present

## 2018-06-02 DIAGNOSIS — J811 Chronic pulmonary edema: Secondary | ICD-10-CM | POA: Diagnosis not present

## 2018-06-02 DIAGNOSIS — R2681 Unsteadiness on feet: Secondary | ICD-10-CM | POA: Diagnosis not present

## 2018-06-02 DIAGNOSIS — J90 Pleural effusion, not elsewhere classified: Secondary | ICD-10-CM | POA: Diagnosis not present

## 2018-06-02 DIAGNOSIS — D62 Acute posthemorrhagic anemia: Secondary | ICD-10-CM | POA: Diagnosis not present

## 2018-06-02 DIAGNOSIS — Z8551 Personal history of malignant neoplasm of bladder: Secondary | ICD-10-CM | POA: Diagnosis not present

## 2018-06-02 DIAGNOSIS — I97120 Postprocedural cardiac arrest following cardiac surgery: Secondary | ICD-10-CM | POA: Diagnosis not present

## 2018-06-02 DIAGNOSIS — I97190 Other postprocedural cardiac functional disturbances following cardiac surgery: Secondary | ICD-10-CM | POA: Diagnosis not present

## 2018-06-02 DIAGNOSIS — I495 Sick sinus syndrome: Secondary | ICD-10-CM | POA: Diagnosis not present

## 2018-06-02 DIAGNOSIS — J9811 Atelectasis: Secondary | ICD-10-CM | POA: Diagnosis not present

## 2018-06-02 DIAGNOSIS — I5043 Acute on chronic combined systolic (congestive) and diastolic (congestive) heart failure: Secondary | ICD-10-CM | POA: Diagnosis not present

## 2018-06-02 DIAGNOSIS — I679 Cerebrovascular disease, unspecified: Secondary | ICD-10-CM | POA: Diagnosis not present

## 2018-06-08 DIAGNOSIS — Z952 Presence of prosthetic heart valve: Secondary | ICD-10-CM | POA: Insufficient documentation

## 2018-06-08 DIAGNOSIS — Z9889 Other specified postprocedural states: Secondary | ICD-10-CM | POA: Insufficient documentation

## 2018-06-09 DIAGNOSIS — R531 Weakness: Secondary | ICD-10-CM | POA: Insufficient documentation

## 2018-06-09 DIAGNOSIS — J90 Pleural effusion, not elsewhere classified: Secondary | ICD-10-CM | POA: Insufficient documentation

## 2018-06-09 DIAGNOSIS — R6889 Other general symptoms and signs: Secondary | ICD-10-CM | POA: Insufficient documentation

## 2018-06-09 DIAGNOSIS — Z789 Other specified health status: Secondary | ICD-10-CM | POA: Insufficient documentation

## 2018-06-09 DIAGNOSIS — Z7409 Other reduced mobility: Secondary | ICD-10-CM | POA: Insufficient documentation

## 2018-06-15 DIAGNOSIS — J310 Chronic rhinitis: Secondary | ICD-10-CM | POA: Diagnosis not present

## 2018-06-15 DIAGNOSIS — E119 Type 2 diabetes mellitus without complications: Secondary | ICD-10-CM | POA: Diagnosis not present

## 2018-06-15 DIAGNOSIS — E569 Vitamin deficiency, unspecified: Secondary | ICD-10-CM | POA: Diagnosis not present

## 2018-06-15 DIAGNOSIS — J9811 Atelectasis: Secondary | ICD-10-CM | POA: Diagnosis not present

## 2018-06-15 DIAGNOSIS — Z9181 History of falling: Secondary | ICD-10-CM | POA: Diagnosis not present

## 2018-06-15 DIAGNOSIS — Z5181 Encounter for therapeutic drug level monitoring: Secondary | ICD-10-CM | POA: Diagnosis not present

## 2018-06-15 DIAGNOSIS — H40029 Open angle with borderline findings, high risk, unspecified eye: Secondary | ICD-10-CM | POA: Diagnosis not present

## 2018-06-15 DIAGNOSIS — Z952 Presence of prosthetic heart valve: Secondary | ICD-10-CM | POA: Diagnosis not present

## 2018-06-15 DIAGNOSIS — E785 Hyperlipidemia, unspecified: Secondary | ICD-10-CM | POA: Diagnosis not present

## 2018-06-15 DIAGNOSIS — R531 Weakness: Secondary | ICD-10-CM | POA: Diagnosis not present

## 2018-06-15 DIAGNOSIS — R2681 Unsteadiness on feet: Secondary | ICD-10-CM | POA: Diagnosis not present

## 2018-06-15 DIAGNOSIS — I422 Other hypertrophic cardiomyopathy: Secondary | ICD-10-CM | POA: Diagnosis not present

## 2018-06-15 DIAGNOSIS — G4700 Insomnia, unspecified: Secondary | ICD-10-CM | POA: Diagnosis not present

## 2018-06-15 DIAGNOSIS — G4733 Obstructive sleep apnea (adult) (pediatric): Secondary | ICD-10-CM | POA: Diagnosis not present

## 2018-06-15 DIAGNOSIS — I4891 Unspecified atrial fibrillation: Secondary | ICD-10-CM | POA: Diagnosis not present

## 2018-06-15 DIAGNOSIS — I482 Chronic atrial fibrillation: Secondary | ICD-10-CM | POA: Diagnosis not present

## 2018-06-15 DIAGNOSIS — Z9889 Other specified postprocedural states: Secondary | ICD-10-CM | POA: Diagnosis not present

## 2018-06-15 DIAGNOSIS — E1165 Type 2 diabetes mellitus with hyperglycemia: Secondary | ICD-10-CM | POA: Diagnosis not present

## 2018-06-15 DIAGNOSIS — Z8679 Personal history of other diseases of the circulatory system: Secondary | ICD-10-CM | POA: Diagnosis not present

## 2018-06-15 DIAGNOSIS — H409 Unspecified glaucoma: Secondary | ICD-10-CM | POA: Diagnosis not present

## 2018-06-15 DIAGNOSIS — M6250 Muscle wasting and atrophy, not elsewhere classified, unspecified site: Secondary | ICD-10-CM | POA: Diagnosis not present

## 2018-06-15 DIAGNOSIS — E039 Hypothyroidism, unspecified: Secondary | ICD-10-CM | POA: Diagnosis not present

## 2018-06-15 DIAGNOSIS — Z954 Presence of other heart-valve replacement: Secondary | ICD-10-CM | POA: Diagnosis not present

## 2018-06-15 DIAGNOSIS — R52 Pain, unspecified: Secondary | ICD-10-CM | POA: Diagnosis not present

## 2018-06-15 DIAGNOSIS — Z950 Presence of cardiac pacemaker: Secondary | ICD-10-CM | POA: Diagnosis not present

## 2018-06-15 DIAGNOSIS — D649 Anemia, unspecified: Secondary | ICD-10-CM | POA: Diagnosis not present

## 2018-06-15 DIAGNOSIS — I5020 Unspecified systolic (congestive) heart failure: Secondary | ICD-10-CM | POA: Diagnosis not present

## 2018-06-15 DIAGNOSIS — R197 Diarrhea, unspecified: Secondary | ICD-10-CM | POA: Diagnosis not present

## 2018-06-17 ENCOUNTER — Other Ambulatory Visit
Admission: RE | Admit: 2018-06-17 | Discharge: 2018-06-17 | Disposition: A | Discharge status: Home / Self Care | Payer: PPO | Source: Ambulatory Visit | Attending: Family Medicine | Admitting: Family Medicine

## 2018-06-17 DIAGNOSIS — E039 Hypothyroidism, unspecified: Secondary | ICD-10-CM | POA: Diagnosis not present

## 2018-06-17 DIAGNOSIS — E785 Hyperlipidemia, unspecified: Secondary | ICD-10-CM | POA: Diagnosis not present

## 2018-06-17 DIAGNOSIS — Z8679 Personal history of other diseases of the circulatory system: Secondary | ICD-10-CM | POA: Diagnosis not present

## 2018-06-17 DIAGNOSIS — E119 Type 2 diabetes mellitus without complications: Secondary | ICD-10-CM | POA: Diagnosis not present

## 2018-06-17 DIAGNOSIS — Z5181 Encounter for therapeutic drug level monitoring: Secondary | ICD-10-CM | POA: Insufficient documentation

## 2018-06-17 DIAGNOSIS — D649 Anemia, unspecified: Secondary | ICD-10-CM | POA: Diagnosis not present

## 2018-06-17 DIAGNOSIS — Z950 Presence of cardiac pacemaker: Secondary | ICD-10-CM | POA: Diagnosis not present

## 2018-06-17 DIAGNOSIS — I482 Chronic atrial fibrillation: Secondary | ICD-10-CM | POA: Insufficient documentation

## 2018-06-17 DIAGNOSIS — H409 Unspecified glaucoma: Secondary | ICD-10-CM | POA: Diagnosis not present

## 2018-06-17 LAB — PROTIME-INR
INR: 2.7
PROTHROMBIN TIME: 28.5 s — AB (ref 11.4–15.2)

## 2018-06-18 ENCOUNTER — Other Ambulatory Visit: Payer: Self-pay | Admitting: *Deleted

## 2018-06-24 ENCOUNTER — Other Ambulatory Visit: Payer: Self-pay | Admitting: *Deleted

## 2018-06-24 DIAGNOSIS — Z8679 Personal history of other diseases of the circulatory system: Secondary | ICD-10-CM | POA: Diagnosis not present

## 2018-06-24 DIAGNOSIS — H409 Unspecified glaucoma: Secondary | ICD-10-CM | POA: Diagnosis not present

## 2018-06-24 DIAGNOSIS — E119 Type 2 diabetes mellitus without complications: Secondary | ICD-10-CM | POA: Diagnosis not present

## 2018-06-24 DIAGNOSIS — E039 Hypothyroidism, unspecified: Secondary | ICD-10-CM | POA: Diagnosis not present

## 2018-06-24 DIAGNOSIS — R197 Diarrhea, unspecified: Secondary | ICD-10-CM | POA: Diagnosis not present

## 2018-06-24 DIAGNOSIS — G4733 Obstructive sleep apnea (adult) (pediatric): Secondary | ICD-10-CM | POA: Diagnosis not present

## 2018-06-24 DIAGNOSIS — D649 Anemia, unspecified: Secondary | ICD-10-CM | POA: Diagnosis not present

## 2018-06-24 DIAGNOSIS — E785 Hyperlipidemia, unspecified: Secondary | ICD-10-CM | POA: Diagnosis not present

## 2018-06-24 NOTE — Patient Outreach (Signed)
[facility]) Care Management  06/24/2018  Cevin Rubinstein Waltz Aug 25, 1934 [id number]   Attended the discharge planning meeting at [facility]. Social worker Janett Billow reports patient plans to discharge tomorrow.   Met with patient and spouse at the bedside.  Explained [facility] Education officer, community.  Spouse explained they planned to go to see cardiothoracic surgeon tomorrow and if all went well patient planned to discharge home.  She stated their daughter was available and supportive.  Spouse stated the patient has done well with therapy but he has had a poor appetite and a low grade temperature.  Spouse stated they did not have transportation or medication cost issues at this time.  Spouse stated she would appreciate the support of a [place] calling them at [facility] discharge.  Spouse stated the best number to reach them is the home number [phone].  Referral sent for [facility] Nurse to complete transition of care program with patient.   Plan to make [facility] UM Team member patient accepted CM services.  Rutherford Limerick RN, BSN [place] Acute Care Coordinator [phone]) Business Mobile [phone]) Toll free office

## 2018-06-25 DIAGNOSIS — J9811 Atelectasis: Secondary | ICD-10-CM | POA: Diagnosis not present

## 2018-06-25 DIAGNOSIS — Z952 Presence of prosthetic heart valve: Secondary | ICD-10-CM | POA: Diagnosis not present

## 2018-06-25 DIAGNOSIS — Z9889 Other specified postprocedural states: Secondary | ICD-10-CM | POA: Diagnosis not present

## 2018-06-25 DIAGNOSIS — Z954 Presence of other heart-valve replacement: Secondary | ICD-10-CM | POA: Diagnosis not present

## 2018-06-26 ENCOUNTER — Telehealth: Payer: Self-pay | Admitting: *Deleted

## 2018-06-26 ENCOUNTER — Other Ambulatory Visit: Payer: Self-pay

## 2018-06-26 DIAGNOSIS — E039 Hypothyroidism, unspecified: Secondary | ICD-10-CM | POA: Diagnosis not present

## 2018-06-26 DIAGNOSIS — M1990 Unspecified osteoarthritis, unspecified site: Secondary | ICD-10-CM | POA: Diagnosis not present

## 2018-06-26 DIAGNOSIS — I4891 Unspecified atrial fibrillation: Secondary | ICD-10-CM | POA: Diagnosis not present

## 2018-06-26 DIAGNOSIS — Z7984 Long term (current) use of oral hypoglycemic drugs: Secondary | ICD-10-CM | POA: Diagnosis not present

## 2018-06-26 DIAGNOSIS — E785 Hyperlipidemia, unspecified: Secondary | ICD-10-CM | POA: Diagnosis not present

## 2018-06-26 DIAGNOSIS — Z7982 Long term (current) use of aspirin: Secondary | ICD-10-CM | POA: Diagnosis not present

## 2018-06-26 DIAGNOSIS — F17210 Nicotine dependence, cigarettes, uncomplicated: Secondary | ICD-10-CM | POA: Diagnosis not present

## 2018-06-26 DIAGNOSIS — Z952 Presence of prosthetic heart valve: Secondary | ICD-10-CM | POA: Diagnosis not present

## 2018-06-26 DIAGNOSIS — I2510 Atherosclerotic heart disease of native coronary artery without angina pectoris: Secondary | ICD-10-CM | POA: Diagnosis not present

## 2018-06-26 DIAGNOSIS — Z48812 Encounter for surgical aftercare following surgery on the circulatory system: Secondary | ICD-10-CM | POA: Diagnosis not present

## 2018-06-26 DIAGNOSIS — Z950 Presence of cardiac pacemaker: Secondary | ICD-10-CM | POA: Diagnosis not present

## 2018-06-26 DIAGNOSIS — E119 Type 2 diabetes mellitus without complications: Secondary | ICD-10-CM | POA: Diagnosis not present

## 2018-06-26 DIAGNOSIS — Z7901 Long term (current) use of anticoagulants: Secondary | ICD-10-CM | POA: Diagnosis not present

## 2018-06-26 NOTE — Patient Outreach (Signed)
[facility]) Care Management  06/26/2018  Dorion Petillo Wasko Jun 30, 1934 [id number]  Susscssful initial telephone outreach encounter to [facility], the above patient's wife and HCPOA. Patient was referred to [facility] Case Management for Transition of Care and Community Case Management after recent hospitalization 8/19-9/1 for MVR/AVR with sternal plating and removal of septal myectomy. He received a permanent pacemaker on 06/11/18 for complete heart block. Patient was discharged to home for self-care with [place] provided by [facility] inplace. Patient has a history of but not limited to bladder and renal cell carcinoma, HTN, DM2, Stroke, Chronic Afib, CAD, and hyperlipidemia. HIPAA identifiers verified by wife. [facility] Community Case Management services were again discussed with wife and she acknowledged giving verbal consent to Janci Minor, RN CM for participation in the [place] Case Management program.   Wife reports patient is doing well since his discharge home from [facility] yesterday. States he is having no pain from his recent surgery. He is ambulating independently with his Rolator. He had his first surgical follow-up visit yesterday. "Surgeon was pleased with how well he is doing". "His blood levels were too high so they have adjusted his coumadin and I have made an appointment with the coumadin clinic for next week". Patient is taking coumadin related to his AVR/MVR.  Medications: --Wife states patient has all medications and takes as prescribed. Patient able to take medications independently. --Medication review completed with wife utilizing SNF discharge AVS and RN CM updated medication list in Epic.  Follow-up Appointments: --Appointments reviewed in EMR. --All upcomming appointments reviewed with wife during this encounter.  --Wife reports follow-up appointments as follows.  9/16 [facility]  9/17 PCP  10/16 Cardiology with Ignacia Bayley, NP  Safety/Mobility/Falls: --Wife denies new or recent falls --Assistive devices used by patient includes a Rolator her received yesterday from [facility]. --[facility] PT completed initial evaluation today and is scheduled to return for therapy on Monday 9/16. OT to evaluated tomorrow.  Social/Community Resources Needed: --Wife currently denies needs for additional  community resources including transportation, meals, medication assistance, housing.  Advanced Directives: --Patient states he currently has Advanced Directives in place. Basic Advanced Directives education discussed with patient and packet will be delivered and reviewed during initial home visit as agreed upon with patient during this encounter.  Patient denied additional issues or concerns other than stated above. RN Case Managers contact information was provided verbally along with the main [facility] CM office number and the 24 hour nurse advice line.  Plan: --RN CM will follow-up with patient in one week via initial home visit scheduled for Monday 06/30/18 at 1:00. --Will send [place] letter to PCP stating [place] Case Management involvement post hospitalization  Rusty Villella E. Rollene Rotunda RN, BSN [facility] Care Management Coordinator [phone]

## 2018-06-26 NOTE — Telephone Encounter (Signed)
Pts spouse called, pt came home yesterday from rehab. He was discharged from [facility] on 9/1 to rehab s/p AVR/MVR replacement and PPM insertion. INR range 2.5-3.5 per spouse and she states his INRs were good at rehab. Yesterday he had a f/u appt at [facility] and his INR was 4.4, they instructed him to skip last night's dosage. Today she is calling for INR instructions. Discussed pt with Erasmo Downer D and we will have pt tak 5mg  QD except 2.5mg  on Tues, Thurs and Sat. With appt on Monday.

## 2018-06-27 ENCOUNTER — Telehealth: Payer: Self-pay | Admitting: Family Medicine

## 2018-06-27 NOTE — Telephone Encounter (Signed)
[name] [name] with [facility] called needing verbal orders for PT 2 times a week for 5 weeks .  CB#  [phone] can leave voice mail  Thanks  Con Memos

## 2018-06-30 ENCOUNTER — Other Ambulatory Visit: Payer: Self-pay

## 2018-06-30 ENCOUNTER — Ambulatory Visit: Payer: PPO

## 2018-06-30 ENCOUNTER — Encounter: Payer: Self-pay | Admitting: Emergency Medicine

## 2018-06-30 ENCOUNTER — Inpatient Hospital Stay
Admission: EM | Admit: 2018-06-30 | Discharge: 2018-07-05 | DRG: 378 | Disposition: A | Discharge status: Home Health | Payer: PPO | Attending: Internal Medicine | Admitting: Internal Medicine

## 2018-06-30 ENCOUNTER — Ambulatory Visit: Payer: Self-pay

## 2018-06-30 DIAGNOSIS — H409 Unspecified glaucoma: Secondary | ICD-10-CM | POA: Diagnosis present

## 2018-06-30 DIAGNOSIS — Z23 Encounter for immunization: Secondary | ICD-10-CM | POA: Diagnosis not present

## 2018-06-30 DIAGNOSIS — G4733 Obstructive sleep apnea (adult) (pediatric): Secondary | ICD-10-CM | POA: Diagnosis present

## 2018-06-30 DIAGNOSIS — E785 Hyperlipidemia, unspecified: Secondary | ICD-10-CM | POA: Diagnosis not present

## 2018-06-30 DIAGNOSIS — D649 Anemia, unspecified: Secondary | ICD-10-CM | POA: Diagnosis not present

## 2018-06-30 DIAGNOSIS — D6859 Other primary thrombophilia: Secondary | ICD-10-CM | POA: Diagnosis not present

## 2018-06-30 DIAGNOSIS — Z952 Presence of prosthetic heart valve: Secondary | ICD-10-CM

## 2018-06-30 DIAGNOSIS — Z7984 Long term (current) use of oral hypoglycemic drugs: Secondary | ICD-10-CM

## 2018-06-30 DIAGNOSIS — T45511A Poisoning by anticoagulants, accidental (unintentional), initial encounter: Secondary | ICD-10-CM | POA: Diagnosis not present

## 2018-06-30 DIAGNOSIS — Z888 Allergy status to other drugs, medicaments and biological substances status: Secondary | ICD-10-CM

## 2018-06-30 DIAGNOSIS — I4891 Unspecified atrial fibrillation: Secondary | ICD-10-CM | POA: Diagnosis present

## 2018-06-30 DIAGNOSIS — E039 Hypothyroidism, unspecified: Secondary | ICD-10-CM | POA: Diagnosis not present

## 2018-06-30 DIAGNOSIS — I4892 Unspecified atrial flutter: Secondary | ICD-10-CM

## 2018-06-30 DIAGNOSIS — Z7982 Long term (current) use of aspirin: Secondary | ICD-10-CM

## 2018-06-30 DIAGNOSIS — F17290 Nicotine dependence, other tobacco product, uncomplicated: Secondary | ICD-10-CM | POA: Diagnosis present

## 2018-06-30 DIAGNOSIS — Z881 Allergy status to other antibiotic agents status: Secondary | ICD-10-CM | POA: Diagnosis not present

## 2018-06-30 DIAGNOSIS — K921 Melena: Secondary | ICD-10-CM

## 2018-06-30 DIAGNOSIS — Z5181 Encounter for therapeutic drug level monitoring: Secondary | ICD-10-CM

## 2018-06-30 DIAGNOSIS — E7211 Homocystinuria: Secondary | ICD-10-CM | POA: Diagnosis not present

## 2018-06-30 DIAGNOSIS — D62 Acute posthemorrhagic anemia: Secondary | ICD-10-CM | POA: Diagnosis not present

## 2018-06-30 DIAGNOSIS — R791 Abnormal coagulation profile: Secondary | ICD-10-CM

## 2018-06-30 DIAGNOSIS — I700 Atherosclerosis of aorta: Secondary | ICD-10-CM | POA: Diagnosis present

## 2018-06-30 DIAGNOSIS — Z8673 Personal history of transient ischemic attack (TIA), and cerebral infarction without residual deficits: Secondary | ICD-10-CM | POA: Diagnosis not present

## 2018-06-30 DIAGNOSIS — K922 Gastrointestinal hemorrhage, unspecified: Secondary | ICD-10-CM

## 2018-06-30 DIAGNOSIS — Z8551 Personal history of malignant neoplasm of bladder: Secondary | ICD-10-CM

## 2018-06-30 DIAGNOSIS — K264 Chronic or unspecified duodenal ulcer with hemorrhage: Secondary | ICD-10-CM | POA: Diagnosis not present

## 2018-06-30 DIAGNOSIS — Z79890 Hormone replacement therapy (postmenopausal): Secondary | ICD-10-CM

## 2018-06-30 DIAGNOSIS — I10 Essential (primary) hypertension: Secondary | ICD-10-CM | POA: Diagnosis present

## 2018-06-30 DIAGNOSIS — I2510 Atherosclerotic heart disease of native coronary artery without angina pectoris: Secondary | ICD-10-CM | POA: Diagnosis not present

## 2018-06-30 DIAGNOSIS — E1169 Type 2 diabetes mellitus with other specified complication: Secondary | ICD-10-CM | POA: Diagnosis not present

## 2018-06-30 DIAGNOSIS — K269 Duodenal ulcer, unspecified as acute or chronic, without hemorrhage or perforation: Secondary | ICD-10-CM | POA: Diagnosis not present

## 2018-06-30 DIAGNOSIS — I421 Obstructive hypertrophic cardiomyopathy: Secondary | ICD-10-CM | POA: Diagnosis not present

## 2018-06-30 DIAGNOSIS — Z950 Presence of cardiac pacemaker: Secondary | ICD-10-CM

## 2018-06-30 DIAGNOSIS — Z7901 Long term (current) use of anticoagulants: Secondary | ICD-10-CM

## 2018-06-30 DIAGNOSIS — I482 Chronic atrial fibrillation, unspecified: Secondary | ICD-10-CM

## 2018-06-30 DIAGNOSIS — E1140 Type 2 diabetes mellitus with diabetic neuropathy, unspecified: Secondary | ICD-10-CM | POA: Diagnosis present

## 2018-06-30 DIAGNOSIS — I442 Atrioventricular block, complete: Secondary | ICD-10-CM | POA: Diagnosis not present

## 2018-06-30 DIAGNOSIS — Z79899 Other long term (current) drug therapy: Secondary | ICD-10-CM

## 2018-06-30 DIAGNOSIS — R531 Weakness: Secondary | ICD-10-CM | POA: Diagnosis not present

## 2018-06-30 DIAGNOSIS — E7800 Pure hypercholesterolemia, unspecified: Secondary | ICD-10-CM | POA: Diagnosis not present

## 2018-06-30 LAB — ABO/RH: ABO/RH(D): A POS

## 2018-06-30 LAB — HEMOGLOBIN
Hemoglobin: 8.3 g/dL — ABNORMAL LOW (ref 13.0–18.0)
Hemoglobin: 8.7 g/dL — ABNORMAL LOW (ref 13.0–18.0)

## 2018-06-30 LAB — COMPREHENSIVE METABOLIC PANEL
ALT: 36 U/L (ref 0–44)
ANION GAP: 9 (ref 5–15)
AST: 48 U/L — ABNORMAL HIGH (ref 15–41)
Albumin: 3.3 g/dL — ABNORMAL LOW (ref 3.5–5.0)
Alkaline Phosphatase: 81 U/L (ref 38–126)
BUN: 15 mg/dL (ref 8–23)
CHLORIDE: 101 mmol/L (ref 98–111)
CO2: 25 mmol/L (ref 22–32)
Calcium: 8.8 mg/dL — ABNORMAL LOW (ref 8.9–10.3)
Creatinine, Ser: 0.88 mg/dL (ref 0.61–1.24)
Glucose, Bld: 152 mg/dL — ABNORMAL HIGH (ref 70–99)
POTASSIUM: 3.8 mmol/L (ref 3.5–5.1)
SODIUM: 135 mmol/L (ref 135–145)
Total Bilirubin: 0.5 mg/dL (ref 0.3–1.2)
Total Protein: 7.8 g/dL (ref 6.5–8.1)

## 2018-06-30 LAB — CBC WITH DIFFERENTIAL/PLATELET
Basophils Absolute: 0.1 10*3/uL (ref 0–0.1)
Basophils Relative: 1 %
EOS ABS: 0.4 10*3/uL (ref 0–0.7)
Eosinophils Relative: 4 %
HCT: 24.6 % — ABNORMAL LOW (ref 40.0–52.0)
Hemoglobin: 8.4 g/dL — ABNORMAL LOW (ref 13.0–18.0)
LYMPHS ABS: 1.8 10*3/uL (ref 1.0–3.6)
LYMPHS PCT: 18 %
MCH: 32 pg (ref 26.0–34.0)
MCHC: 34.2 g/dL (ref 32.0–36.0)
MCV: 93.4 fL (ref 80.0–100.0)
MONO ABS: 0.7 10*3/uL (ref 0.2–1.0)
Monocytes Relative: 7 %
Neutro Abs: 6.8 10*3/uL — ABNORMAL HIGH (ref 1.4–6.5)
Neutrophils Relative %: 70 %
Platelets: 295 10*3/uL (ref 150–440)
RBC: 2.63 MIL/uL — ABNORMAL LOW (ref 4.40–5.90)
RDW: 16.8 % — AB (ref 11.5–14.5)
WBC: 9.9 10*3/uL (ref 3.8–10.6)

## 2018-06-30 LAB — PROTIME-INR
INR: 5.3 — AB
PROTHROMBIN TIME: 48.2 s — AB (ref 11.4–15.2)

## 2018-06-30 LAB — POCT INR: INR: 7.2 — AB (ref 2.0–3.0)

## 2018-06-30 LAB — GLUCOSE, CAPILLARY
GLUCOSE-CAPILLARY: 82 mg/dL (ref 70–99)
GLUCOSE-CAPILLARY: 83 mg/dL (ref 70–99)

## 2018-06-30 LAB — PREPARE RBC (CROSSMATCH)

## 2018-06-30 MED ORDER — INFLUENZA VAC SPLIT HIGH-DOSE 0.5 ML IM SUSY
0.5000 mL | PREFILLED_SYRINGE | INTRAMUSCULAR | Status: AC
  Administered 2018-07-05: 0.5 mL via INTRAMUSCULAR
  Filled 2018-06-30 (×3): qty 0.5

## 2018-06-30 MED ORDER — FAMOTIDINE IN NACL 20-0.9 MG/50ML-% IV SOLN
20.0000 mg | Freq: Two times a day (BID) | INTRAVENOUS | Status: DC
  Administered 2018-06-30: 20 mg via INTRAVENOUS
  Filled 2018-06-30: qty 50

## 2018-06-30 MED ORDER — BISACODYL 5 MG PO TBEC: 5.0000 mg | DELAYED_RELEASE_TABLET | Freq: Every day | ORAL | Status: DC | PRN

## 2018-06-30 MED ORDER — SENNOSIDES-DOCUSATE SODIUM 8.6-50 MG PO TABS: 1.0000 | ORAL_TABLET | Freq: Every evening | ORAL | Status: DC | PRN

## 2018-06-30 MED ORDER — LATANOPROST 0.005 % OP SOLN
1.0000 [drp] | Freq: Every day | OPHTHALMIC | Status: DC
  Administered 2018-06-30 – 2018-07-04 (×5): 1 [drp] via OPHTHALMIC
  Filled 2018-06-30: qty 2.5

## 2018-06-30 MED ORDER — SODIUM CHLORIDE 0.9 % IV SOLN
8.0000 mg/h | INTRAVENOUS | Status: AC
  Administered 2018-06-30 – 2018-07-02 (×3): 8 mg/h via INTRAVENOUS
  Filled 2018-06-30 (×4): qty 80
  Filled 2018-06-30: qty 40
  Filled 2018-06-30 (×2): qty 80

## 2018-06-30 MED ORDER — PANTOPRAZOLE SODIUM 40 MG IV SOLR
40.0000 mg | Freq: Once | INTRAVENOUS | Status: AC
  Administered 2018-06-30: 40 mg via INTRAVENOUS
  Filled 2018-06-30: qty 40

## 2018-06-30 MED ORDER — LEVOTHYROXINE SODIUM 50 MCG PO TABS
50.0000 ug | ORAL_TABLET | Freq: Every day | ORAL | Status: DC
  Administered 2018-07-01 – 2018-07-05 (×3): 50 ug via ORAL
  Filled 2018-06-30 (×3): qty 1

## 2018-06-30 MED ORDER — ACETAMINOPHEN 325 MG PO TABS: 650.0000 mg | ORAL_TABLET | Freq: Four times a day (QID) | ORAL | Status: DC | PRN

## 2018-06-30 MED ORDER — ONDANSETRON HCL 4 MG/2ML IJ SOLN: 4.0000 mg | Freq: Four times a day (QID) | INTRAMUSCULAR | Status: DC | PRN

## 2018-06-30 MED ORDER — INSULIN ASPART 100 UNIT/ML ~~LOC~~ SOLN
0.0000 [IU] | Freq: Three times a day (TID) | SUBCUTANEOUS | Status: DC
  Administered 2018-07-03: 2 [IU] via SUBCUTANEOUS
  Administered 2018-07-03 – 2018-07-05 (×3): 1 [IU] via SUBCUTANEOUS
  Filled 2018-06-30 (×4): qty 1

## 2018-06-30 MED ORDER — PANTOPRAZOLE SODIUM 40 MG IV SOLR
40.0000 mg | Freq: Two times a day (BID) | INTRAVENOUS | Status: DC
  Administered 2018-07-04 – 2018-07-05 (×4): 40 mg via INTRAVENOUS
  Filled 2018-06-30 (×4): qty 40

## 2018-06-30 MED ORDER — SODIUM CHLORIDE 0.9 % IV SOLN
INTRAVENOUS | Status: DC
  Administered 2018-07-01: 11:00:00 via INTRAVENOUS

## 2018-06-30 MED ORDER — INSULIN ASPART 100 UNIT/ML ~~LOC~~ SOLN
0.0000 [IU] | Freq: Every day | SUBCUTANEOUS | Status: DC
  Administered 2018-07-04: 2 [IU] via SUBCUTANEOUS

## 2018-06-30 MED ORDER — ONDANSETRON HCL 4 MG PO TABS: 4.0000 mg | ORAL_TABLET | Freq: Four times a day (QID) | ORAL | Status: DC | PRN

## 2018-06-30 MED ORDER — HYDROCODONE-ACETAMINOPHEN 5-325 MG PO TABS: 1.0000 | ORAL_TABLET | ORAL | Status: DC | PRN

## 2018-06-30 MED ORDER — ALBUTEROL SULFATE (2.5 MG/3ML) 0.083% IN NEBU: 2.5000 mg | INHALATION_SOLUTION | RESPIRATORY_TRACT | Status: DC | PRN

## 2018-06-30 MED ORDER — SIMVASTATIN 20 MG PO TABS
20.0000 mg | ORAL_TABLET | Freq: Every day | ORAL | Status: DC
  Administered 2018-06-30 – 2018-07-04 (×3): 20 mg via ORAL
  Filled 2018-06-30 (×3): qty 1
  Filled 2018-06-30: qty 2
  Filled 2018-06-30: qty 1

## 2018-06-30 MED ORDER — ACETAMINOPHEN 650 MG RE SUPP: 650.0000 mg | Freq: Four times a day (QID) | RECTAL | Status: DC | PRN

## 2018-06-30 MED ORDER — SODIUM CHLORIDE 0.9 % IV SOLN
10.0000 mL/h | Freq: Once | INTRAVENOUS | Status: AC
  Administered 2018-06-30: 10 mL/h via INTRAVENOUS

## 2018-06-30 NOTE — Progress Notes (Signed)
Advanced Care Plan.  Purpose of Encounter: CODE STATUS. Parties in Attendance: The patient, his wife and daughter, [name] and me. Patient's Decisional Capacity: Yes Medical Story: [name] [name]  is a 84 y.o. male with a known history of multiple medical problems including A. Fib, HOCM, atherosclerosis of aorta, bladder cancer, carotid arterial disease, hypertension, diabetes, hyperlipidemia, sleep apnea and stroke etc. patient is being admitted for GI bleeding and acute blood loss anemia.  I discussed with the patient about his current condition, prognosis and CODE STATUS.  The patient want to be resuscitated and intubated to get him back. Plan:  Code Status: Full code Time spent discussing advance care planning  17-18 minutes.

## 2018-06-30 NOTE — H&P (Signed)
[place] [facility] [place] NAME: [name] [name]    MR#:  [id number]  DATE OF BIRTH:  1934-04-09  DATE OF ADMISSION:  06/30/2018  PRIMARY CARE PHYSICIAN: [name], MD   REQUESTING/REFERRING PHYSICIAN: Dr. Joni Fears.  CHIEF COMPLAINT:   Chief Complaint  Patient presents with  . Abnormal Lab  . Melena   Melena for 2 days. HISTORY OF PRESENT ILLNESS:  [name] [name]  is a 84 y.o. male with a known history of multiple medical problems including A. Fib, HOCM, atherosclerosis of aorta, bladder cancer, carotid arterial disease, hypertension, diabetes, hyperlipidemia, sleep apnea and stroke etc.  The patient had recent open heart surgery with 2 mechanical heart valve placement and a pacemaker placement on August 20.  He has been on Coumadin for more than 20 years due to A. fib.  He never has a history of GI bleeding.  He has had a large amount melena for the past 2 days.  He also complains of a generalized weakness.  He was found elevated INR more than 5, hemoglobin decreased to 8.4 from 11.8.  He denies any other symptoms.  Dr. Joni Fears contact with the [facility] cardiovascular surgeon, who suggested hold Coumadin for now but need to continue Coumadin at therapeutic range for mechanical vales. PAST MEDICAL HISTORY:   Past Medical History:  Diagnosis Date  . Atherosclerosis of aorta ([place])    by CT scan in past  . Atrial fibrillation ([place])    and aflutter. pt has a left atrial circuit that is not ablated. was on amiodarone-stopped, now use rate control.   . Bladder cancer ([place])    hx; treated with BCG in past   . Carotid artery disease ([place])    There was calcified plaque but no stenosis by carotid artery screening done at [facility] October, 2013  . Diabetes mellitus    type not specified  . Diabetic neuropathy (HCC)    feet  . Elevated liver enzymes    over time; hx  . Excessive sweating   . Glaucoma   . Gout   . Head  injury    when slipped on ice 2004-2005. stabilized and back on Coumadin   . Headache    migraines - distant past  . HOCM (hypertrophic obstructive cardiomyopathy) ([place])    a. 01/2016 Echo: EF 65-70%, no rwma, LVOT gradient of 80-87mmHg, mod AS, SAM, sev dil LA, PASP 66mmHg; b. 11/2017 Echo: EF 55-60%, near cavity obliteration in systole, mod AS, mild MS, mod to sev dil LA, nl RV fxn, PASP 33mmHg.  [name] Homocystinemia ([place])    elevated, mild   . Hypercholesterolemia    treated.   . Hypertension   . Kidney mass    laproscopic surgery woth cryoablation of a mass outside kidney followed at [facility].   . Moderate aortic stenosis    a. 01/2016 echo: mod AS.   [name] Motion sickness    ocean boats  . Nausea   . Obstructive sleep apnea    CPAP started successfully 2014  . Orthostatic hypotension    a. orthostatic. dehydration. hospitalized 11/11  . Sleep apnea    Significant obstructive sleep apnea diagnosed in August, 2012, the patient is to receive CPAP   . Stroke [facility])    "2 old strokes" CT and MRI. [place] [facility] 11/11. diagnosis was dehydration, no acutal reports.   . TSH elevation    on synthroid historically   . Unsteady gait  August, 2012    PAST SURGICAL HISTORY:   Past Surgical History:  Procedure Laterality Date  . BLADDER SURGERY    . CATARACT EXTRACTION W/PHACO Left 05/11/2015   Procedure: CATARACT EXTRACTION PHACO AND INTRAOCULAR LENS PLACEMENT (IOC);  Surgeon: Leandrew Koyanagi, MD;  Location: [facility];  Service: Ophthalmology;  Laterality: Left;  DIABETIC - oral meds, CPAP  . KIDNEY SURGERY     "froze mass"  . TONSILLECTOMY      SOCIAL HISTORY:   Social History   Tobacco Use  . Smoking status: Former Smoker    Types: Cigars, Cigarettes    Last attempt to quit: 10/02/1974    Years since quitting: 43.7  . Smokeless tobacco: Never Used  . Tobacco comment: still smokes cigars once a day  Substance Use Topics  . Alcohol use: No    Frequency: Never     FAMILY HISTORY:   Family History  Problem Relation Age of Onset  . Arrhythmia Father        A-Fib  . Arrhythmia Brother        A-Fib  . Heart attack Neg Hx   . Hypertension Neg Hx   . Stroke Neg Hx     DRUG ALLERGIES:   Allergies  Allergen Reactions  . Macrolides And Ketolides Other (See Comments)  . Mycinettes   . Nitrofuran Derivatives Other (See Comments)  . Nitrofurantoin Other (See Comments)  . Erythromycin Itching and Rash    Other reaction(s): UNKNOWN  And red skin    REVIEW OF SYSTEMS:   Review of Systems  Constitutional: Positive for malaise/fatigue. Negative for chills and fever.  HENT: Negative for sore throat.   Eyes: Negative for blurred vision and double vision.  Respiratory: Negative for cough, hemoptysis, shortness of breath, wheezing and stridor.   Cardiovascular: Negative for chest pain, palpitations, orthopnea and leg swelling.  Gastrointestinal: Positive for melena. Negative for abdominal pain, blood in stool, diarrhea, nausea and vomiting.  Genitourinary: Negative for dysuria, flank pain and hematuria.  Musculoskeletal: Negative for back pain and joint pain.  Skin: Negative for rash.  Neurological: Negative for dizziness, sensory change, focal weakness, seizures, loss of consciousness, weakness and headaches.  Endo/Heme/Allergies: Negative for polydipsia.  Psychiatric/Behavioral: Negative for depression. The patient is not nervous/anxious.     MEDICATIONS AT HOME:   Prior to Admission medications   Medication Sig Start Date End Date Taking? Authorizing Provider  aspirin 81 MG chewable tablet Chew 81 mg by mouth at bedtime.  06/16/18 06/16/19 Yes [provider]  latanoprost (XALATAN) 0.005 % ophthalmic solution Place 1 drop into both eyes at bedtime.  11/13/13  Yes [provider]  levothyroxine (SYNTHROID, LEVOTHROID) 50 MCG tablet TAKE ONE TABLET BY MOUTH ONCE DAILY 06/05/17  Yes [name], MD  metFORMIN  (GLUCOPHAGE) 1000 MG tablet Take 1,000 mg by mouth 2 (two) times daily. 06/15/18 06/15/19 Yes [provider]  simvastatin (ZOCOR) 40 MG tablet TAKE 1/2 (ONE-HALF) TABLET BY MOUTH AT BEDTIME 05/05/18  Yes Wellington Hampshire, MD  warfarin (COUMADIN) 5 MG tablet TAKE AS DIRECTED BY  COUMADIN  CLINIC Patient taking differently: Take 2.5-5 mg by mouth at bedtime. 2.5mg -Tuesday,thursday,saturday 5mg - Friday,sunday, 08/05/17  Yes Wellington Hampshire, MD  acetaminophen (TYLENOL) 325 MG tablet Take 650 mg by mouth every 6 (six) hours as needed. 06/15/18   [provider]  albuterol (PROVENTIL HFA;VENTOLIN HFA) 108 (90 Base) MCG/ACT inhaler Inhale 2 puffs into the lungs every 6 (six) hours as needed  for wheezing or shortness of breath. Patient not taking: Reported on 06/26/2018 09/21/17   Nicholes Mango, MD  digoxin (LANOXIN) 0.125 MG tablet TAKE 1 TABLET BY MOUTH ONCE DAILY Patient not taking: Reported on 06/26/2018 12/25/17   Wellington Hampshire, MD  glucose blood (ONE TOUCH ULTRA TEST) test strip USE ONE STRIP TO CHECK GLUCOSE ONCE DAILY 03/25/17   [name], MD  ONE [facility] ULTRA TEST test strip USE TO TEST BLOOD SUGAR ONCE DAILY 05/02/18   [name], MD  [facility] DELICA LANCETS 73Z MISC USE ONE LANCET TO CHECK BLOOD GLUCOSE ONCE DAILY 03/25/17   [name], MD  [facility] DELICA LANCETS 32D MISC USE TO TEST BLOOD SUGAR ONCE DAILY 05/02/18   [name], MD      VITAL SIGNS:  Blood pressure (!) 124/50, pulse 80, temperature 98.3 F (36.8 C), temperature source Oral, resp. rate (!) 21, height 5\' 9"  (1.753 m), weight 81.6 kg, SpO2 99 %.  PHYSICAL EXAMINATION:  Physical Exam  GENERAL:  84 y.o.-year-old patient lying in the bed with no acute distress.  EYES: Pupils equal, round, reactive to light and accommodation. No scleral icterus. Extraocular muscles intact.  HEENT: Head atraumatic, normocephalic. Oropharynx and nasopharynx clear.  NECK:  Supple, no  jugular venous distention. No thyroid enlargement, no tenderness.  LUNGS: Normal breath sounds bilaterally, no wheezing, rales,rhonchi or crepitation. No use of accessory muscles of respiration.  CARDIOVASCULAR: S1, S2 normal.  Mechanical valve clicks, no rubs, or gallops.  ABDOMEN: Soft, nontender, nondistended. Bowel sounds present. No organomegaly or mass.  EXTREMITIES: No pedal edema, cyanosis, or clubbing.  NEUROLOGIC: Cranial nerves II through XII are intact. Muscle strength 5/5 in all extremities. Sensation intact. Gait not checked.  PSYCHIATRIC: The patient is alert and oriented x 3.  SKIN: No obvious rash, lesion, or ulcer.   LABORATORY PANEL:   CBC Recent Labs  Lab 06/30/18 1000  WBC 9.9  HGB 8.4*  HCT 24.6*  PLT 295   ------------------------------------------------------------------------------------------------------------------  Chemistries  Recent Labs  Lab 06/30/18 1000  NA 135  K 3.8  CL 101  CO2 25  GLUCOSE 152*  BUN 15  CREATININE 0.88  CALCIUM 8.8*  AST 48*  ALT 36  ALKPHOS 81  BILITOT 0.5   ------------------------------------------------------------------------------------------------------------------  Cardiac Enzymes No results for input(s): TROPONINI in the last 168 hours. ------------------------------------------------------------------------------------------------------------------  RADIOLOGY:  No results found.    IMPRESSION AND PLAN:   GI bleeding with melena. The patient will be admitted to medical floor. N.p.o. except meds, IV fluid support, IV Protonix, follow-up hemoglobin every 6 hours and GI consult.  Anemia due to acute blood loss secondary to GI bleeding. PRBC transfusion 1 unit for now. Follow-up hemoglobin every 6 hours.    Hypercoagulopathy.  Hold Coumadin and follow-up INR.  Recent heart surgery and the pacemaker placement, on Coumadin. Per [facility] cardiovascular surgeon, the patient did need to keep her Coumadin at  therapeutic level due to recent surgery.  Can hold Coumadin for now due to elevated INR.  Diabetes.  Start sliding scale.  Hold metformin. Hypertension.  Continue home hypertension medication.  Hold if blood pressure is low.  All the records are reviewed and case discussed with ED provider. Management plans discussed with the patient, his wife and daughter and they are in agreement.  CODE STATUS: Full code  TOTAL TIME TAKING CARE OF THIS PATIENT: 43 minutes.    Demetrios Loll M.D on 06/30/2018 at 2:31 PM  Between 7am to 6pm - Pager - [phone]  After 6pm go to [email or web address] - password [email or web address] [facility]  [employer] [facility]  Office  [phone]  CC: Primary care physician; [name], MD   Note: This dictation was prepared with Dragon dictation along with smaller phrase technology. Any transcriptional errors that result from this process are unin

## 2018-06-30 NOTE — Telephone Encounter (Signed)
Please review. Thanks!  

## 2018-06-30 NOTE — ED Triage Notes (Signed)
States had cardiac valve replacement at [facility] at 8/20. Today was at [facility] for follow up and INR over 7. Has noted black stools x 3 days.

## 2018-06-30 NOTE — Consult Note (Signed)
ANTICOAGULATION CONSULT NOTE - Initial Consult  Pharmacy Consult for warfarin Indication: mechanical heart valve  Allergies  Allergen Reactions  . Macrolides And Ketolides Other (See Comments)  . Mycinettes   . Nitrofuran Derivatives Other (See Comments)  . Nitrofurantoin Other (See Comments)  . Erythromycin Itching and Rash    Other reaction(s): UNKNOWN  And red skin    Patient Measurements: Height: 5\' 9"  (175.3 cm) Weight: 180 lb (81.6 kg) IBW/kg (Calculated) : 70.7 Heparin Dosing Weight:   Vital Signs: Temp: 98.3 F (36.8 C) (09/16 0951) Temp Source: Oral (09/16 0951) BP: 117/51 (09/16 1300) Pulse Rate: 80 (09/16 1300)  Labs: Recent Labs    06/30/18 0958 06/30/18 1000  HGB  --  8.4*  HCT  --  24.6*  PLT  --  295  LABPROT  --  48.2*  INR 7.2* 5.30*  CREATININE  --  0.88    Estimated Creatinine Clearance: 64.7 mL/min (by C-G formula based on SCr of 0.88 mg/dL).   Medical History: Past Medical History:  Diagnosis Date  . Atherosclerosis of aorta ([place])    by CT scan in past  . Atrial fibrillation ([place])    and aflutter. pt has a left atrial circuit that is not ablated. was on amiodarone-stopped, now use rate control.   . Bladder cancer ([place])    hx; treated with BCG in past   . Carotid artery disease ([place])    There was calcified plaque but no stenosis by carotid artery screening done at [facility] October, 2013  . Diabetes mellitus    type not specified  . Diabetic neuropathy (HCC)    feet  . Elevated liver enzymes    over time; hx  . Excessive sweating   . Glaucoma   . Gout   . Head injury    when slipped on ice 2004-2005. stabilized and back on Coumadin   . Headache    migraines - distant past  . HOCM (hypertrophic obstructive cardiomyopathy) ([place])    a. 01/2016 Echo: EF 65-70%, no rwma, LVOT gradient of 80-75mmHg, mod AS, SAM, sev dil LA, PASP 63mmHg; b. 11/2017 Echo: EF 55-60%, near cavity obliteration in systole, mod AS, mild MS,  mod to sev dil LA, nl RV fxn, PASP 74mmHg.  [name] Homocystinemia ([place])    elevated, mild   . Hypercholesterolemia    treated.   . Hypertension   . Kidney mass    laproscopic surgery woth cryoablation of a mass outside kidney followed at [facility].   . Moderate aortic stenosis    a. 01/2016 echo: mod AS.   [name] Motion sickness    ocean boats  . Nausea   . Obstructive sleep apnea    CPAP started successfully 2014  . Orthostatic hypotension    a. orthostatic. dehydration. hospitalized 11/11  . Sleep apnea    Significant obstructive sleep apnea diagnosed in August, 2012, the patient is to receive CPAP   . Stroke [facility])    "2 old strokes" CT and MRI. [place] [facility] 11/11. diagnosis was dehydration, no acutal reports.   . TSH elevation    on synthroid historically   . Unsteady gait    August, 2012    Medications:  Scheduled:  . [START ON 07/04/2018] pantoprazole  40 mg Intravenous Q12H    Assessment: Patient is a 84 year old male on warfarin PTA for a mechanical heart valve. However pt was admitted for elevated INR (7.2 outpatient) and black stools. Pt just had valve surgery (  myectomy/mechanical AVR/MRV 8/20 at [facility]. Per PA at [facility], no vit K. Pt will be transfused. INR in ER 5.30.  Pt INR was elevated 9/11 (4.4) thought to be due to diarrhea  Goal of Therapy:  2.5-3.5 Monitor platelets by anticoagulation protocol: Yes   Plan:  Warfarin on hold. Continue to monitor INR and CBC. No reversal per cardiology  Ramond Dial, Pharm.D, BCPS Clinical Pharmacist 06/30/2018,1:57 PM

## 2018-06-30 NOTE — Patient Instructions (Signed)
Pt's INR supratherapeutic.   Pt c/o dark, tarry stools x 2 days. Pt taken by wheelchair to ED for evaluation.

## 2018-06-30 NOTE — ED Notes (Addendum)
Date and time results received: 06/30/18 11:10 AM   Test: INR Critical Value: 5.30  Name of Provider Notified: Dr. Joni Fears

## 2018-06-30 NOTE — Consult Note (Signed)
[name], MD [facility]  [address], [place], [place] [address] Phone: [phone] Fax : [phone]  Consultation  Referring Provider:     Dr. Vallarie Mare Primary Care Physician:  [name], MD Primary Gastroenterologist: Althia Forts         Reason for Consultation:     Melena  Date of Admission:  06/30/2018 Date of Consultation:  06/30/2018         HPI:   [name] [name] is a 84 y.o. male who has a history of A. fib, HOCM, carotid artery disease hypertension diabetes hyperlipidemia with a recent open heart surgery at [facility] with the replacement of the mitral and aortic valve.  The patient also had repair of his septum.  The patient had been on Coumadin for more than 20 years and reports that he started to have black stools that were tarry 3 days ago.  The patient had been on iron and was not concerned about the black stools but the consistency was different.  The patient then started to have generalized weakness and was concerned about his INR.  The patient then had his INR checked which showed it to be more than 5.  The patient also had a hemoglobin drop from 11.8-8.4.  The patient has not had any further GI bleeding or black stools the last 2 days.  The patient's last colonoscopy was in 2003 and he reports he had an upper endoscopy in the past with stretching of his esophagus. The patient also had bradycardia postop and had a pacemaker placed.  Past Medical History:  Diagnosis Date  . Atherosclerosis of aorta ([place])    by CT scan in past  . Atrial fibrillation ([place])    and aflutter. pt has a left atrial circuit that is not ablated. was on amiodarone-stopped, now use rate control.   . Bladder cancer ([place])    hx; treated with BCG in past   . Carotid artery disease ([place])    There was calcified plaque but no stenosis by carotid artery screening done at [facility] October, 2013  . Diabetes mellitus    type not specified  . Diabetic  neuropathy (HCC)    feet  . Elevated liver enzymes    over time; hx  . Excessive sweating   . Glaucoma   . Gout   . Head injury    when slipped on ice 2004-2005. stabilized and back on Coumadin   . Headache    migraines - distant past  . HOCM (hypertrophic obstructive cardiomyopathy) ([place])    a. 01/2016 Echo: EF 65-70%, no rwma, LVOT gradient of 80-85mmHg, mod AS, SAM, sev dil LA, PASP 100mmHg; b. 11/2017 Echo: EF 55-60%, near cavity obliteration in systole, mod AS, mild MS, mod to sev dil LA, nl RV fxn, PASP 78mmHg.  [name] Homocystinemia ([place])    elevated, mild   . Hypercholesterolemia    treated.   . Hypertension   . Kidney mass    laproscopic surgery woth cryoablation of a mass outside kidney followed at [facility].   . Moderate aortic stenosis    a. 01/2016 echo: mod AS.   [name] Motion sickness    ocean boats  . Nausea   . Obstructive sleep apnea    CPAP started successfully 2014  . Orthostatic hypotension    a. orthostatic. dehydration. hospitalized 11/11  . Sleep apnea    Significant obstructive sleep apnea diagnosed in August, 2012, the patient is to receive  CPAP   . Stroke [facility])    "2 old strokes" CT and MRI. [facility] 11/11. diagnosis was dehydration, no acutal reports.   . TSH elevation    on synthroid historically   . Unsteady gait    August, 2012    Past Surgical History:  Procedure Laterality Date  . BLADDER SURGERY    . CATARACT EXTRACTION W/PHACO Left 05/11/2015   Procedure: CATARACT EXTRACTION PHACO AND INTRAOCULAR LENS PLACEMENT (IOC);  Surgeon: Leandrew Koyanagi, MD;  Location: [place];  Service: Ophthalmology;  Laterality: Left;  DIABETIC - oral meds, CPAP  . KIDNEY SURGERY     "froze mass"  . TONSILLECTOMY    . VALVE REPLACEMENT      Prior to Admission medications   Medication Sig Start Date End Date Taking? Authorizing Provider  aspirin 81 MG chewable tablet Chew 81 mg by mouth at bedtime.  06/16/18 06/16/19 Yes [provider]    latanoprost (XALATAN) 0.005 % ophthalmic solution Place 1 drop into both eyes at bedtime.  11/13/13  Yes [provider]  levothyroxine (SYNTHROID, LEVOTHROID) 50 MCG tablet TAKE ONE TABLET BY MOUTH ONCE DAILY 06/05/17  Yes [name], MD  metFORMIN (GLUCOPHAGE) 1000 MG tablet Take 1,000 mg by mouth 2 (two) times daily. 06/15/18 06/15/19 Yes [provider]  simvastatin (ZOCOR) 40 MG tablet TAKE 1/2 (ONE-HALF) TABLET BY MOUTH AT BEDTIME 05/05/18  Yes Wellington Hampshire, MD  warfarin (COUMADIN) 5 MG tablet TAKE AS DIRECTED BY  COUMADIN  CLINIC Patient taking differently: Take 2.5-5 mg by mouth at bedtime. 2.5mg -Tuesday,thursday,saturday 5mg - Friday,sunday, 08/05/17  Yes Wellington Hampshire, MD  acetaminophen (TYLENOL) 325 MG tablet Take 650 mg by mouth every 6 (six) hours as needed. 06/15/18   [provider]  albuterol (PROVENTIL HFA;VENTOLIN HFA) 108 (90 Base) MCG/ACT inhaler Inhale 2 puffs into the lungs every 6 (six) hours as needed for wheezing or shortness of breath. Patient not taking: Reported on 06/26/2018 09/21/17   Nicholes Mango, MD  digoxin (LANOXIN) 0.125 MG tablet TAKE 1 TABLET BY MOUTH ONCE DAILY Patient not taking: Reported on 06/26/2018 12/25/17   Wellington Hampshire, MD  glucose blood (ONE TOUCH ULTRA TEST) test strip USE ONE STRIP TO CHECK GLUCOSE ONCE DAILY 03/25/17   [name], MD  ONE [facility] ULTRA TEST test strip USE TO TEST BLOOD SUGAR ONCE DAILY 05/02/18   [name], MD  [facility] DELICA LANCETS 68T MISC USE ONE LANCET TO CHECK BLOOD GLUCOSE ONCE DAILY 03/25/17   [name], MD  [facility] DELICA LANCETS 41D MISC USE TO TEST BLOOD SUGAR ONCE DAILY 05/02/18   [name], MD    Family History  Problem Relation Age of Onset  . Arrhythmia Father        A-Fib  . Arrhythmia Brother        A-Fib  . Heart attack Neg Hx   . Hypertension Neg Hx   . Stroke Neg Hx      Social History   Tobacco Use  . Smoking  status: Former Smoker    Types: Cigars, Cigarettes    Last attempt to quit: 10/02/1974    Years since quitting: 43.7  . Smokeless tobacco: Never Used  . Tobacco comment: still smokes cigars once a day  Substance Use Topics  . Alcohol use: No    Frequency: Never  . Drug use: No    Allergies as of 06/30/2018 - Review Complete 06/30/2018  Allergen Reaction Noted  . Macrolides and ketolides Other (See Comments) 06/13/2015  . Mycinettes  06/15/2011  . Nitrofuran derivatives Other (See Comments) 05/21/2014  . Nitrofurantoin Other (See Comments) 06/13/2015  . Erythromycin Itching and Rash 05/21/2014    Review of Systems:    All systems reviewed and negative except where noted in HPI.   Physical Exam:  Vital signs in last 24 hours: Temp:  [98.3 F (36.8 C)-98.8 F (37.1 C)] 98.4 F (36.9 C) (09/16 1955) Pulse Rate:  [80-82] 80 (09/16 1955) Resp:  [16-29] 16 (09/16 1955) BP: (114-132)/(46-82) 122/51 (09/16 1955) SpO2:  [96 %-100 %] 96 % (09/16 1955) Weight:  [81.6 kg] 81.6 kg (09/16 0952) Last BM Date: 06/29/18 General:   Pleasant, cooperative in NAD Head:  Normocephalic and atraumatic. Eyes:   No icterus.   Conjunctiva pink. PERRLA. Ears:  Normal auditory acuity. Neck:  Supple; no masses or thyroidomegaly Lungs: Respirations even and unlabored. Lungs clear to auscultation bilaterally.   No wheezes, crackles, or rhonchi.  Heart:  Regular rate and rhythm;  Without murmur, clicks, rubs or gallops Abdomen:  Soft, nondistended, nontender. Normal bowel sounds. No appreciable masses or hepatomegaly.  No rebound or guarding.  Rectal:  Not performed. Msk:  Symmetrical without gross deformities.   Extremities:  Without edema, cyanosis or clubbing. Neurologic:  Alert and oriented x3;  grossly normal neurologically. Skin:  Intact without significant lesions or rashes. Cervical Nodes:  No significant cervical adenopathy. Psych:  Alert and cooperative. Normal affect.  LAB  RESULTS: Recent Labs    06/30/18 1000 06/30/18 1446  WBC 9.9  --   HGB 8.4* 8.3*  HCT 24.6*  --   PLT 295  --    BMET Recent Labs    06/30/18 1000  NA 135  K 3.8  CL 101  CO2 25  GLUCOSE 152*  BUN 15  CREATININE 0.88  CALCIUM 8.8*   LFT Recent Labs    06/30/18 1000  PROT 7.8  ALBUMIN 3.3*  AST 48*  ALT 36  ALKPHOS 81  BILITOT 0.5   PT/INR Recent Labs    06/30/18 0958 06/30/18 1000  LABPROT  --  48.2*  INR 7.2* 5.30*    STUDIES: No results found.    Impression / Plan:   Assessment: Active Problems:   GIB (gastrointestinal bleeding)   [name] [name] is a 84 y.o. y/o male with melena and a drop in hemoglobin.  The patient was transfused with packed red blood cells.  The patient likely did not have any further bleeding since his melena has stopped in the last 2 days.  He denies any NSAID use but had a supratherapeutic INR.  The Coumadin is being held at the present time.  Plan: The patient will be set up for a EGD for tomorrow.  The patient may have peptic ulcer disease due to his recent stressful situation with open heart surgery.  The patient also had his bleeding complicated by a high INR due to his Coumadin.  I believe the patient's bleeding has stopped since he is no longer having any melena.  The patient has been explained the plan and agrees with it.  Thank you for involving me in the care of this patient.      LOS: 0 days   [name], MD  06/30/2018, 8:04 PM    Note: This dictation was prepared with Dragon dictation along with smaller phrase technology. Any transcriptional errors that result from this process are unintentional.

## 2018-06-30 NOTE — Progress Notes (Signed)
Pt received to room 209 via stretcher from ED. Pt alert and oriented. Pt oriented to room. Family with pt. Pt denies pain or discomfort.

## 2018-06-30 NOTE — H&P (View-Only) (Signed)
[name], MD [facility]  [address], [place], [place] [address] Phone: [phone] Fax : [phone]  Consultation  Referring Provider:     Dr. Vallarie Mare Primary Care Physician:  [name], MD Primary Gastroenterologist: Althia Forts         Reason for Consultation:     Melena  Date of Admission:  06/30/2018 Date of Consultation:  06/30/2018         HPI:   [name] [name] is a 84 y.o. male who has a history of A. fib, HOCM, carotid artery disease hypertension diabetes hyperlipidemia with a recent open heart surgery at [facility] with the replacement of the mitral and aortic valve.  The patient also had repair of his septum.  The patient had been on Coumadin for more than 20 years and reports that he started to have black stools that were tarry 3 days ago.  The patient had been on iron and was not concerned about the black stools but the consistency was different.  The patient then started to have generalized weakness and was concerned about his INR.  The patient then had his INR checked which showed it to be more than 5.  The patient also had a hemoglobin drop from 11.8-8.4.  The patient has not had any further GI bleeding or black stools the last 2 days.  The patient's last colonoscopy was in 2003 and he reports he had an upper endoscopy in the past with stretching of his esophagus. The patient also had bradycardia postop and had a pacemaker placed.  Past Medical History:  Diagnosis Date  . Atherosclerosis of aorta ([place])    by CT scan in past  . Atrial fibrillation ([place])    and aflutter. pt has a left atrial circuit that is not ablated. was on amiodarone-stopped, now use rate control.   . Bladder cancer ([place])    hx; treated with BCG in past   . Carotid artery disease ([place])    There was calcified plaque but no stenosis by carotid artery screening done at [facility] October, 2013  . Diabetes mellitus    type not specified  . Diabetic  neuropathy (H[facility])    feet  . Elevated liver enzymes    over time; hx  . Excessive sweating   . Glaucoma   . Gout   . Head injury    when slipped on ice 2004-2005. stabilized and back on Coumadin   . Headache    migraines - distant past  . HOCM (hypertrophic obstructive cardiomyopathy) ([place])    a. 01/2016 Echo: EF 65-70%, no rwma, LVOT gradient of 80-64mmHg, mod AS, SAM, sev dil LA, PASP 76mmHg; b. 11/2017 Echo: EF 55-60%, near cavity obliteration in systole, mod AS, mild MS, mod to sev dil LA, nl RV fxn, PASP 33mmHg.  [name] Homocystinemia ([place])    elevated, mild   . Hypercholesterolemia    treated.   . Hypertension   . Kidney mass    laproscopic surgery woth cryoablation of a mass outside kidney followed at [facility].   . Moderate aortic stenosis    a. 01/2016 echo: mod AS.   [name] Motion sickness    ocean boats  . Nausea   . Obstructive sleep apnea    CPAP started successfully 2014  . Orthostatic hypotension    a. orthostatic. dehydration. hospitalized 11/11  . Sleep apnea    Significant obstructive sleep apnea diagnosed in August, 2012, the patient is to receive  CPAP   . Stroke [facility])    "2 old strokes" CT and MRI. [facility] 11/11. diagnosis was dehydration, no acutal reports.   . TSH elevation    on synthroid historically   . Unsteady gait    August, 2012    Past Surgical History:  Procedure Laterality Date  . BLADDER SURGERY    . CATARACT EXTRACTION W/PHACO Left 05/11/2015   Procedure: CATARACT EXTRACTION PHACO AND INTRAOCULAR LENS PLACEMENT (IOC);  Surgeon: Leandrew Koyanagi, MD;  Location: [place];  Service: Ophthalmology;  Laterality: Left;  DIABETIC - oral meds, CPAP  . KIDNEY SURGERY     "froze mass"  . TONSILLECTOMY    . VALVE REPLACEMENT      Prior to Admission medications   Medication Sig Start Date End Date Taking? Authorizing Provider  aspirin 81 MG chewable tablet Chew 81 mg by mouth at bedtime.  06/16/18 06/16/19 Yes [provider]    latanoprost (XALATAN) 0.005 % ophthalmic solution Place 1 drop into both eyes at bedtime.  11/13/13  Yes [provider]  levothyroxine (SYNTHROID, LEVOTHROID) 50 MCG tablet TAKE ONE TABLET BY MOUTH ONCE DAILY 06/05/17  Yes [name], MD  metFORMIN (GLUCOPHAGE) 1000 MG tablet Take 1,000 mg by mouth 2 (two) times daily. 06/15/18 06/15/19 Yes [provider]  simvastatin (ZOCOR) 40 MG tablet TAKE 1/2 (ONE-HALF) TABLET BY MOUTH AT BEDTIME 05/05/18  Yes Wellington Hampshire, MD  warfarin (COUMADIN) 5 MG tablet TAKE AS DIRECTED BY  COUMADIN  CLINIC Patient taking differently: Take 2.5-5 mg by mouth at bedtime. 2.5mg -Tuesday,thursday,saturday 5mg - Friday,sunday, 08/05/17  Yes Wellington Hampshire, MD  acetaminophen (TYLENOL) 325 MG tablet Take 650 mg by mouth every 6 (six) hours as needed. 06/15/18   [provider]  albuterol (PROVENTIL HFA;VENTOLIN HFA) 108 (90 Base) MCG/ACT inhaler Inhale 2 puffs into the lungs every 6 (six) hours as needed for wheezing or shortness of breath. Patient not taking: Reported on 06/26/2018 09/21/17   Nicholes Mango, MD  digoxin (LANOXIN) 0.125 MG tablet TAKE 1 TABLET BY MOUTH ONCE DAILY Patient not taking: Reported on 06/26/2018 12/25/17   Wellington Hampshire, MD  glucose blood (ONE TOUCH ULTRA TEST) test strip USE ONE STRIP TO CHECK GLUCOSE ONCE DAILY 03/25/17   [name], MD  ONE [facility] ULTRA TEST test strip USE TO TEST BLOOD SUGAR ONCE DAILY 05/02/18   [name], MD  [facility] DELICA LANCETS 96E MISC USE ONE LANCET TO CHECK BLOOD GLUCOSE ONCE DAILY 03/25/17   [name], MD  [facility] DELICA LANCETS 95M MISC USE TO TEST BLOOD SUGAR ONCE DAILY 05/02/18   [name], MD    Family History  Problem Relation Age of Onset  . Arrhythmia Father        A-Fib  . Arrhythmia Brother        A-Fib  . Heart attack Neg Hx   . Hypertension Neg Hx   . Stroke Neg Hx      Social History   Tobacco Use  . Smoking  status: Former Smoker    Types: Cigars, Cigarettes    Last attempt to quit: 10/02/1974    Years since quitting: 43.7  . Smokeless tobacco: Never Used  . Tobacco comment: still smokes cigars once a day  Substance Use Topics  . Alcohol use: No    Frequency: Never  . Drug use: No    Allergies as of 06/30/2018 - Review Complete 06/30/2018  Allergen Reaction Noted  . Macrolides and ketolides Other (See Comments) 06/13/2015  . Mycinettes  06/15/2011  . Nitrofuran derivatives Other (See Comments) 05/21/2014  . Nitrofurantoin Other (See Comments) 06/13/2015  . Erythromycin Itching and Rash 05/21/2014    Review of Systems:    All systems reviewed and negative except where noted in HPI.   Physical Exam:  Vital signs in last 24 hours: Temp:  [98.3 F (36.8 C)-98.8 F (37.1 C)] 98.4 F (36.9 C) (09/16 1955) Pulse Rate:  [80-82] 80 (09/16 1955) Resp:  [16-29] 16 (09/16 1955) BP: (114-132)/(46-82) 122/51 (09/16 1955) SpO2:  [96 %-100 %] 96 % (09/16 1955) Weight:  [81.6 kg] 81.6 kg (09/16 0952) Last BM Date: 06/29/18 General:   Pleasant, cooperative in NAD Head:  Normocephalic and atraumatic. Eyes:   No icterus.   Conjunctiva pink. PERRLA. Ears:  Normal auditory acuity. Neck:  Supple; no masses or thyroidomegaly Lungs: Respirations even and unlabored. Lungs clear to auscultation bilaterally.   No wheezes, crackles, or rhonchi.  Heart:  Regular rate and rhythm;  Without murmur, clicks, rubs or gallops Abdomen:  Soft, nondistended, nontender. Normal bowel sounds. No appreciable masses or hepatomegaly.  No rebound or guarding.  Rectal:  Not performed. Msk:  Symmetrical without gross deformities.   Extremities:  Without edema, cyanosis or clubbing. Neurologic:  Alert and oriented x3;  grossly normal neurologically. Skin:  Intact without significant lesions or rashes. Cervical Nodes:  No significant cervical adenopathy. Psych:  Alert and cooperative. Normal affect.  LAB  RESULTS: Recent Labs    06/30/18 1000 06/30/18 1446  WBC 9.9  --   HGB 8.4* 8.3*  HCT 24.6*  --   PLT 295  --    BMET Recent Labs    06/30/18 1000  NA 135  K 3.8  CL 101  CO2 25  GLUCOSE 152*  BUN 15  CREATININE 0.88  CALCIUM 8.8*   LFT Recent Labs    06/30/18 1000  PROT 7.8  ALBUMIN 3.3*  AST 48*  ALT 36  ALKPHOS 81  BILITOT 0.5   PT/INR Recent Labs    06/30/18 0958 06/30/18 1000  LABPROT  --  48.2*  INR 7.2* 5.30*    STUDIES: No results found.    Impression / Plan:   Assessment: Active Problems:   GIB (gastrointestinal bleeding)   [name] [name] is a 84 y.o. y/o male with melena and a drop in hemoglobin.  The patient was transfused with packed red blood cells.  The patient likely did not have any further bleeding since his melena has stopped in the last 2 days.  He denies any NSAID use but had a supratherapeutic INR.  The Coumadin is being held at the present time.  Plan: The patient will be set up for a EGD for tomorrow.  The patient may have peptic ulcer disease due to his recent stressful situation with open heart surgery.  The patient also had his bleeding complicated by a high INR due to his Coumadin.  I believe the patient's bleeding has stopped since he is no longer having any melena.  The patient has been explained the plan and agrees with it.  Thank you for involving me in the care of this patient.      LOS: 0 days   [name], MD  06/30/2018, 8:04 PM    Note: This dictation was prepared with Dragon dictation along with smaller phrase technology. Any transcriptional errors that result from this process are unintentional.

## 2018-06-30 NOTE — Progress Notes (Signed)
Blood transfusion complete without signs of reaction.

## 2018-06-30 NOTE — Telephone Encounter (Signed)
L/M advising [name] [name] as below.

## 2018-06-30 NOTE — Telephone Encounter (Signed)
ok 

## 2018-06-30 NOTE — ED Provider Notes (Addendum)
[facility] Emergency Department Provider Note  ____________________________________________  Time seen: Approximately 1:34 PM  I have reviewed the triage vital signs and the nursing notes.   HISTORY  Chief Complaint Abnormal Lab and Melena    HPI [name] [name] is a 84 y.o. male with a history of diabetes, CAD, HOCM, atrial fibrillation, recent open heart surgery with 2 mechanical heart valve placement on June 03, 2018 at [facility] comes the ED complaining of generalized weakness and lightheadedness on standing for the past 2 days.  He is also having black stool for the past 3 days.  He notes that his INR has been running low but high for the past week around 4.4 and he is continued his usual Coumadin dose.  Denies any nausea vomiting or abdominal pain.  No history of GI bleed or peptic ulcer disease.  No liver disease.   Patient reports 2 black bowel movements yesterday.  In Coumadin clinic today INR was found to be 7 and patient was sent to the ED.   Past Medical History:  Diagnosis Date  . Atherosclerosis of aorta ([place])    by CT scan in past  . Atrial fibrillation ([place])    and aflutter. pt has a left atrial circuit that is not ablated. was on amiodarone-stopped, now use rate control.   . Bladder cancer ([place])    hx; treated with BCG in past   . Carotid artery disease ([place])    There was calcified plaque but no stenosis by carotid artery screening done at [facility] October, 2013  . Diabetes mellitus    type not specified  . Diabetic neuropathy (HCC)    feet  . Elevated liver enzymes    over time; hx  . Excessive sweating   . Glaucoma   . Gout   . Head injury    when slipped on ice 2004-2005. stabilized and back on Coumadin   . Headache    migraines - distant past  . HOCM (hypertrophic obstructive cardiomyopathy) ([place])    a. 01/2016 Echo: EF 65-70%, no rwma, LVOT gradient of 80-67mmHg, mod AS, SAM, sev dil LA, PASP 60mmHg; b. 11/2017  Echo: EF 55-60%, near cavity obliteration in systole, mod AS, mild MS, mod to sev dil LA, nl RV fxn, PASP 26mmHg.  [name] Homocystinemia ([place])    elevated, mild   . Hypercholesterolemia    treated.   . Hypertension   . Kidney mass    laproscopic surgery woth cryoablation of a mass outside kidney followed at [facility].   . Moderate aortic stenosis    a. 01/2016 echo: mod AS.   [name] Motion sickness    ocean boats  . Nausea   . Obstructive sleep apnea    CPAP started successfully 2014  . Orthostatic hypotension    a. orthostatic. dehydration. hospitalized 11/11  . Sleep apnea    Significant obstructive sleep apnea diagnosed in August, 2012, the patient is to receive CPAP   . Stroke [facility])    "2 old strokes" CT and MRI. [place] [facility] 11/11. diagnosis was dehydration, no acutal reports.   . TSH elevation    on synthroid historically   . Unsteady gait    August, 2012     Patient Active Problem List   Diagnosis Date Noted  . Heart murmur, systolic 02/72/5366  . H/O: rheumatic fever 03/21/2017  . Tinnitus 10/30/2016  . On warfarin therapy 07/06/2015  . Arteriosclerosis of coronary artery 06/13/2015  . Carcinoma in situ  of bladder 06/13/2015  . Diabetes mellitus, type 2 ([place]) 06/13/2015  . Diabetic neuropathy ([place]) 06/13/2015  . Dizziness 06/13/2015  . Essential (primary) hypertension 06/13/2015  . Fatigue 06/13/2015  . Glaucoma 06/13/2015  . Hypercholesteremia 06/13/2015  . Male hypogonadism 06/13/2015  . Adult hypothyroidism 06/13/2015  . Arthritis, degenerative 06/13/2015  . CAP (community acquired pneumonia) 06/13/2015  . Adenocarcinoma, renal cell ([place]) 06/13/2015  . Benign head tremor 12/13/2014  . Carotid arterial disease ([place]) 07/12/2014  . Decreased cardiac output 07/12/2014  . Cardiomyopathy, hypertrophic obstructive ([place]) 07/12/2014  . Head injuries 07/12/2014  . HOCM (hypertrophic obstructive cardiomyopathy) ([place])   . Encounter for therapeutic drug monitoring 11/16/2013   . Obstructive sleep apnea   . Benign prostatic hypertrophy without urinary obstruction 06/09/2012  . History of neoplasm of bladder 06/09/2012  . Head injury   . Atherosclerosis of aorta ([place])   . Hypotension   . Stroke ([place])   . Unsteady gait   . Excessive sweating   . Nausea   . ORTHOSTATIC HYPOTENSION, HX OF 09/01/2010  . Overweight(278.02) 03/07/2009  . MITRAL REGURGITATION 03/05/2009  . Chronic atrial fibrillation ([place]) 03/05/2009  . Atrial fibrillation and flutter ([place]) 03/05/2009  . Mitral and aortic incompetence 03/05/2009     Past Surgical History:  Procedure Laterality Date  . BLADDER SURGERY    . CATARACT EXTRACTION W/PHACO Left 05/11/2015   Procedure: CATARACT EXTRACTION PHACO AND INTRAOCULAR LENS PLACEMENT (IOC);  Surgeon: Leandrew Koyanagi, MD;  Location: [place];  Service: Ophthalmology;  Laterality: Left;  DIABETIC - oral meds, CPAP  . KIDNEY SURGERY     "froze mass"  . TONSILLECTOMY       Prior to Admission medications   Medication Sig Start Date End Date Taking? Authorizing Provider  aspirin 81 MG chewable tablet Chew 81 mg by mouth at bedtime.  06/16/18 06/16/19 Yes [provider]  latanoprost (XALATAN) 0.005 % ophthalmic solution Place 1 drop into both eyes at bedtime.  11/13/13  Yes [provider]  levothyroxine (SYNTHROID, LEVOTHROID) 50 MCG tablet TAKE ONE TABLET BY MOUTH ONCE DAILY 06/05/17  Yes [name], MD  metFORMIN (GLUCOPHAGE) 1000 MG tablet Take 1,000 mg by mouth 2 (two) times daily. 06/15/18 06/15/19 Yes [provider]  simvastatin (ZOCOR) 40 MG tablet TAKE 1/2 (ONE-HALF) TABLET BY MOUTH AT BEDTIME 05/05/18  Yes Wellington Hampshire, MD  warfarin (COUMADIN) 5 MG tablet TAKE AS DIRECTED BY  COUMADIN  CLINIC Patient taking differently: Take 2.5-5 mg by mouth at bedtime. 2.5mg -Tuesday,thursday,saturday 5mg - Friday,sunday, 08/05/17  Yes Wellington Hampshire, MD  acetaminophen (TYLENOL) 325 MG tablet Take 650  mg by mouth every 6 (six) hours as needed. 06/15/18   [provider]  albuterol (PROVENTIL HFA;VENTOLIN HFA) 108 (90 Base) MCG/ACT inhaler Inhale 2 puffs into the lungs every 6 (six) hours as needed for wheezing or shortness of breath. Patient not taking: Reported on 06/26/2018 09/21/17   Nicholes Mango, MD  digoxin (LANOXIN) 0.125 MG tablet TAKE 1 TABLET BY MOUTH ONCE DAILY Patient not taking: Reported on 06/26/2018 12/25/17   Wellington Hampshire, MD  glucose blood (ONE TOUCH ULTRA TEST) test strip USE ONE STRIP TO CHECK GLUCOSE ONCE DAILY 03/25/17   [name], MD  ONE [facility] ULTRA TEST test strip USE TO TEST BLOOD SUGAR ONCE DAILY 05/02/18   [name], MD  [facility] DELICA LANCETS 10X MISC USE ONE LANCET TO CHECK BLOOD GLUCOSE ONCE DAILY 03/25/17   Miguel Aschoff  Kaylyn Lim., MD  [facility] DELICA LANCETS 01S MISC USE TO TEST BLOOD SUGAR ONCE DAILY 05/02/18   [name], MD     Allergies Macrolides and ketolides; Mycinettes; Nitrofuran derivatives; Nitrofurantoin; and Erythromycin   Family History  Problem Relation Age of Onset  . Arrhythmia Father        A-Fib  . Arrhythmia Brother        A-Fib  . Heart attack Neg Hx   . Hypertension Neg Hx   . Stroke Neg Hx     Social History Social History   Tobacco Use  . Smoking status: Former Smoker    Types: Cigars, Cigarettes    Last attempt to quit: 10/02/1974    Years since quitting: 43.7  . Smokeless tobacco: Never Used  . Tobacco comment: still smokes cigars once a day  Substance Use Topics  . Alcohol use: No    Frequency: Never  . Drug use: No    Review of Systems  Constitutional:   No fever positive chills  ENT:   No sore throat. No rhinorrhea. Cardiovascular:   No chest pain or syncope. Respiratory:   No dyspnea or cough. Gastrointestinal:   Negative for abdominal pain, vomiting and diarrhea.  Musculoskeletal:   Negative for focal pain or swelling All other systems reviewed and are negative  except as documented above in ROS and HPI.  ____________________________________________   PHYSICAL EXAM:  VITAL SIGNS: ED Triage Vitals  Enc Vitals Group     BP 06/30/18 0951 132/60     Pulse Rate 06/30/18 0951 81     Resp 06/30/18 0951 18     Temp 06/30/18 0951 98.3 F (36.8 C)     Temp Source 06/30/18 0951 Oral     SpO2 06/30/18 0951 99 %     Weight 06/30/18 0952 180 lb (81.6 kg)     Height 06/30/18 0952 5\' 9"  (1.753 m)     Head Circumference --      [place] --      Pain Score 06/30/18 0952 0     Pain Loc --      Pain Edu? --      Excl. in [place]? --     Vital signs reviewed, nursing assessments reviewed.   Constitutional:   Alert and oriented. Non-toxic appearance. Eyes:   Conjunctivae are pale. EOMI. PERRL. ENT      Head:   Normocephalic and atraumatic.      Nose:   No congestion/rhinnorhea.       Mouth/Throat:   MMM, no pharyngeal erythema. No peritonsillar mass.       Neck:   No meningismus. Full ROM. Hematological/Lymphatic/Immunilogical:   No cervical lymphadenopathy. Cardiovascular:   RRR. Symmetric bilateral radial and DP pulses.  No murmurs. Cap refill less than 2 seconds. Respiratory:   Normal respiratory effort without tachypnea/retractions. Breath sounds are clear and equal bilaterally. No wheezes/rales/rhonchi. Gastrointestinal:   Soft and nontender. Non distended. There is no CVA tenderness.  No rebound, rigidity, or guarding.  Rectal exam reveals no stool, secretions Hemoccult positive  Musculoskeletal:   Normal range of motion in all extremities. No joint effusions.  No lower extremity tenderness.  No edema. Neurologic:   Normal speech and language.  Motor grossly intact. No acute focal neurologic deficits are appreciated.  Skin:    Skin is warm, dry and intact. No rash noted.  Sternotomy incision well-healed.  Chest tube sites well-healed.  No petechiae, purpura, or bullae.  ____________________________________________  LABS (pertinent  positives/negatives) (all labs ordered are listed, but only abnormal results are displayed) Labs Reviewed  CBC WITH DIFFERENTIAL/PLATELET - Abnormal; Notable for the following components:      Result Value   RBC 2.63 (*)    Hemoglobin 8.4 (*)    HCT 24.6 (*)    RDW 16.8 (*)    Neutro Abs 6.8 (*)    All other components within normal limits  COMPREHENSIVE METABOLIC PANEL - Abnormal; Notable for the following components:   Glucose, Bld 152 (*)    Calcium 8.8 (*)    Albumin 3.3 (*)    AST 48 (*)    All other components within normal limits  PROTIME-INR - Abnormal; Notable for the following components:   Prothrombin Time 48.2 (*)    INR 5.30 (*)    All other components within normal limits  PREPARE RBC (CROSSMATCH)  TYPE AND SCREEN   ____________________________________________   EKG  Interpreted by me Paced rhythm, rate of 82, left axis, left bundle branch block, no acute ischemic changes.  ____________________________________________    RADIOLOGY  No results found.  ____________________________________________   PROCEDURES .Critical Care Performed by: Carrie Mew, MD Authorized by: Carrie Mew, MD   Critical care provider statement:    Critical care time (minutes):  30   Critical care time was exclusive of:  Separately billable procedures and treating other patients   Critical care was necessary to treat or prevent imminent or life-threatening deterioration of the following conditions:  Circulatory failure and cardiac failure   Critical care was time spent personally by me on the following activities:  Development of treatment plan with patient or surrogate, discussions with consultants, evaluation of patient's response to treatment, examination of patient, obtaining history from patient or surrogate, ordering and performing treatments and interventions, ordering and review of laboratory studies, ordering and review of radiographic studies, pulse oximetry,  re-evaluation of patient's condition and review of old charts    ____________________________________________    CLINICAL IMPRESSION / [place] / ED COURSE  Pertinent labs & imaging results that were available during my care of the patient were reviewed by me and considered in my medical decision making (see chart for details).    Patient presents with supratherapeutic INR, GI bleed.  He is on Coumadin and recently postop from mechanical heart valve placement.  Vital signs are stable although he does have symptomatic anemia with a decrease of his hemoglobin from a baseline of 11-8.4 today.  INR is 5.3 when checked in the lab.  Due to the necessity of maintaining anticoagulation, and lack of severe GI bleed at the moment based on exam findings, my initial plan is to hold Coumadin but do not reverse the anticoagulation and trend the INR until it is in the therapeutic range.  Continue to monitor vital signs and hemoglobin.  We will plan to transfuse 1 unit to improve the symptomatic anemia and avoid excessive heart strain given underlying heart disease and recent surgery.  Clinical Course as of Jun 30 1333  Mon Jun 30, 2018  1330 D/w Dr. Despina Pole PA at [facility], agrees with no reversal of coumadin, will need to hold coumadin and trend INR. Transfuse 1 unit for sx anemia and cardiac support.     [PS]    Clinical Course User Index [PS] Carrie Mew, MD     ____________________________________________   FINAL CLINICAL IMPRESSION(S) / ED DIAGNOSES    Final diagnoses:  Symptomatic anemia  Acute upper GI bleed  Supratherapeutic INR  Mechanical heart valve present     ED Discharge Orders    None      Portions of this note were generated with dragon dictation software. Dictation errors may occur despite best attempts at proofreading.    Carrie Mew, MD 06/30/18 1428    Carrie Mew, MD 07/19/18 [phone]

## 2018-06-30 NOTE — Progress Notes (Signed)
Call placed to [phone] on call requesting D5ns,bs 83 @ present,NPO for EDG  that  is scheduled late morning.denied.advised to recheck BS @ 0200.Call ended.

## 2018-06-30 NOTE — Patient Outreach (Signed)
[facility]) Care Management  06/30/2018  [name] [name] 09/08/1934 [id number]  Care Coordination: Successful telephone encounter to [name] [name], the above patient's wife, after RN CM left HIPAA compliant VM message on wife's cell. RN CM received notification that patient was directly admitted to [name] ED from [facility] Coumadin clinic this morning for INR of 7. Patient's wife confirmed this information. States patient has had black stools for a couple of days and "looks pale". Patient's wife appreciative of RN CM call to check on patient. Initial home visit for today will be cancelled and rescheduled pending potential hospital readmission.  Plan: Follow patient remotely until discharge and reschedule initial home visit/TOC when appropriate.   Cree Kunert E. Rollene Rotunda RN, BSN [facility] Care Management Coordinator [phone]

## 2018-07-01 ENCOUNTER — Ambulatory Visit: Payer: PPO | Admitting: Family Medicine

## 2018-07-01 ENCOUNTER — Encounter: Payer: Self-pay | Admitting: Anesthesiology

## 2018-07-01 ENCOUNTER — Inpatient Hospital Stay: Payer: PPO | Admitting: Anesthesiology

## 2018-07-01 ENCOUNTER — Encounter
Admission: EM | Disposition: A | Discharge status: Home Health | Payer: Self-pay | Source: Home / Self Care | Attending: Internal Medicine

## 2018-07-01 DIAGNOSIS — K921 Melena: Secondary | ICD-10-CM

## 2018-07-01 DIAGNOSIS — K264 Chronic or unspecified duodenal ulcer with hemorrhage: Secondary | ICD-10-CM

## 2018-07-01 HISTORY — PX: ESOPHAGOGASTRODUODENOSCOPY (EGD) WITH PROPOFOL: SHX5813

## 2018-07-01 LAB — BPAM RBC
Blood Product Expiration Date: 201910082359
ISSUE DATE / TIME: 201909161513
Unit Type and Rh: 6200

## 2018-07-01 LAB — BASIC METABOLIC PANEL
ANION GAP: 7 (ref 5–15)
BUN: 12 mg/dL (ref 8–23)
CHLORIDE: 106 mmol/L (ref 98–111)
CO2: 24 mmol/L (ref 22–32)
Calcium: 8.5 mg/dL — ABNORMAL LOW (ref 8.9–10.3)
Creatinine, Ser: 0.73 mg/dL (ref 0.61–1.24)
GFR calc Af Amer: >60 mL/min (ref >60)
GFR calc non Af Amer: >60 mL/min (ref >60)
Glucose, Bld: 93 mg/dL (ref 70–99)
Potassium: 4 mmol/L (ref 3.5–5.1)
SODIUM: 137 mmol/L (ref 135–145)

## 2018-07-01 LAB — GLUCOSE, CAPILLARY
GLUCOSE-CAPILLARY: 87 mg/dL (ref 70–99)
GLUCOSE-CAPILLARY: 95 mg/dL (ref 70–99)
Glucose-Capillary: 90 mg/dL (ref 70–99)
Glucose-Capillary: 98 mg/dL (ref 70–99)
Glucose-Capillary: 96 mg/dL (ref 70–99)

## 2018-07-01 LAB — TYPE AND SCREEN
ABO/RH(D): A POS
ANTIBODY SCREEN: NEGATIVE
Unit division: 0

## 2018-07-01 LAB — HEMOGLOBIN
Hemoglobin: 8.5 g/dL — ABNORMAL LOW (ref 13.0–18.0)
Hemoglobin: 8.5 g/dL — ABNORMAL LOW (ref 13.0–18.0)

## 2018-07-01 LAB — PROTIME-INR
INR: 5.37
PROTHROMBIN TIME: 48.7 s — AB (ref 11.4–15.2)

## 2018-07-01 SURGERY — ESOPHAGOGASTRODUODENOSCOPY (EGD) WITH PROPOFOL
Anesthesia: General

## 2018-07-01 MED ORDER — PROPOFOL 10 MG/ML IV BOLUS
INTRAVENOUS | Status: DC | PRN
  Administered 2018-07-01: 20 mg via INTRAVENOUS
  Administered 2018-07-01: 60 mg via INTRAVENOUS

## 2018-07-01 MED ORDER — DEXTROSE-NACL 5-0.9 % IV SOLN
INTRAVENOUS | Status: DC
  Administered 2018-07-01 – 2018-07-02 (×2): via INTRAVENOUS

## 2018-07-01 NOTE — Progress Notes (Signed)
RT to patient bedside to assist with CPAP. Patient has a home CPAP in room, however, [employer] did not approve for use due to frayed wires. Hospital provided DreamStation CPAP for QHS. Patient resting comfortably in bed. Clear/Diminished breath sounds, 95% SAT on Room Air.

## 2018-07-01 NOTE — Progress Notes (Signed)
[place] [place] NAME: [name] [name]    MR#:  [id number]  DATE OF BIRTH:  Roth 22, 1935  SUBJECTIVE: Patient admitted for black stool 3 days ago associated with generalized weakness.  Patient went to have INR check and showed more than 7 so he was sent to emergency room.  Denies abdominal pain  CHIEF COMPLAINT:   Chief Complaint  Patient presents with  . Abnormal Lab  . Melena  1 unit of packed RBC transfusion yesterday in the emergency room.  Hemoglobin stable around 8.5.  Prolonged hospitalization at [facility] for recent aortic and mitral valve replacement, myomectomy of the septum for his hokum, had a history of complete heart block status post pacemaker, discharged from [facility] 2 days ago to home went to have INR checked yesterday where it was found to be elevated that prompted ER visit.  REVIEW OF SYSTEMS:   ROS CONSTITUTIONAL: No fever, fatigue or weakness.  EYES: No blurred or double vision.  EARS, NOSE, AND THROAT: No tinnitus or ear pain.  RESPIRATORY: No cough, shortness of breath, wheezing or hemoptysis.  CARDIOVASCULAR: No chest pain, orthopnea, edema.  And has big midline incision for the chest. GASTROINTESTINAL: No nausea, vomiting, diarrhea or abdominal pain.  GENITOURINARY: No dysuria, hematuria.  ENDOCRINE: No polyuria, nocturia,  HEMATOLOGY: No anemia, easy bruising or bleeding SKIN: No rash or lesion. MUSCULOSKELETAL: No joint pain or arthritis.   NEUROLOGIC: No tingling, numbness, weakness.  PSYCHIATRY: No anxiety or depression.   DRUG ALLERGIES:   Allergies  Allergen Reactions  . Macrolides And Ketolides Other (See Comments)  . Mycinettes   . Nitrofuran Derivatives Other (See Comments)  . Nitrofurantoin Other (See Comments)  . Erythromycin Itching and Rash    Other reaction(s): UNKNOWN  And red skin    VITALS:  Blood pressure 128/66, pulse 81, temperature 97.6 F (36.4 C), temperature source  Oral, resp. rate 20, height 5\' 9"  (1.753 m), weight 81.6 kg, SpO2 97 %.  PHYSICAL EXAMINATION:  GENERAL:  84 y.o.-year-old patient lying in the bed with no acute distress.  EYES: Pupils equal, round, reactive to light and accommodation. No scleral icterus. Extraocular muscles intact.  HEENT: Head atraumatic, normocephalic. Oropharynx and nasopharynx clear.  NECK:  Supple, no jugular venous distention. No thyroid enlargement, no tenderness.  LUNGS: Normal breath sounds bilaterally, no wheezing, rales,rhonchi or crepitation. No use of accessory muscles of respiration.  CARDIOVASCULAR: S1, S2 normal. No murmurs, rubs, or gallops.  ABDOMEN: Soft, nontender, nondistended. Bowel sounds present. No organomegaly or mass.  EXTREMITIES: No pedal edema, cyanosis, or clubbing.  NEUROLOGIC: Cranial nerves II through XII are intact. Muscle strength 5/5 in all extremities. Sensation intact. Gait not checked.  PSYCHIATRIC: The patient is alert and oriented x 3.  SKIN: No obvious rash, lesion, or ulcer.    LABORATORY PANEL:   CBC Recent Labs  Lab 06/30/18 1000  07/01/18 0433  WBC 9.9  --   --   HGB 8.4*   < > 8.5*  HCT 24.6*  --   --   PLT 295  --   --    < > = values in this interval not displayed.   ------------------------------------------------------------------------------------------------------------------  Chemistries  Recent Labs  Lab 06/30/18 1000 07/01/18 0433  NA 135 137  K 3.8 4.0  CL 101 106  CO2 25 24  GLUCOSE 152* 93  BUN 15 12  CREATININE 0.88 0.73  CALCIUM 8.8* 8.5*  AST 48*  --  ALT 36  --   ALKPHOS 81  --   BILITOT 0.5  --    ------------------------------------------------------------------------------------------------------------------  Cardiac Enzymes No results for input(s): TROPONINI in the last 168 hours. ------------------------------------------------------------------------------------------------------------------  RADIOLOGY:  No results  found.  EKG:   Orders placed or performed during the hospital encounter of 06/30/18  . ED EKG  . ED EKG    ASSESSMENT AND PLAN:   84 year old male patient with history of recent diuretic, mitral valve repair, septal myomectomy, history of complete heart block status post pacemaker at [facility] comes in with black stool  #1. possible upper GI bleed: Black stool, none now.   Hemoglobin is stable around 8.4.  Patient is on Protonix drip.  Scheduled for EGD this afternoon at 11 AM as per my discussion with Dr. Allen Norris.  Continue n.p.o., IV fluids, start diet if EGD is unremarkable. #2 supratherapeutic INR, INR was 7 yesterday and now 5 today patient had recent long cardiac history with history of aortic valve replacement, mitral valve replacement, septal surgery for history of hokum on Coumadin.  Coumadin is on hold because of high INR.  No evidence of active GI bleed.  Patient hemodynamically stable also.  Pharmacy to manage Coumadin.  Discussed with patient's wife that because of recen aortic and mitral valve INR goal should be 2.5-3.5.  Patient chest wall scar is healing well without evidence of infection.  Patient complains of itching over the scar area can use topical Benadryl. 3.  Diabetes mellitus type 2: Patient is n.p.o. at this time to sliding scale insulin with coverage. 4.  Deconditioning with muscle weakness, patient went to rehab after cardiac surgery just discharged from [facility] few days ago getting home health physical therapy. 5.  Hypothyroidism: Continue Synthyroid.  Than 50% time spent in counseling, coordination of care, discussed care with Dr. Allen Norris,, reviewed medical records from [facility] also. All the records are reviewed and case discussed with Care Management/Social Workerr. Management plans discussed with the patient, family and they are in agreement.  CODE STATUS:full  TOTAL TIME TAKING CARE OF THIS PATIENT: 35 min.   POSSIBLE D/C IN 1-2 DAYS, DEPENDING ON  CLINICAL CONDITION.   Epifanio Lesches M.D on 07/01/2018 at 8:32 AM  Between 7am to 6pm - Pager - [phone]  After 6pm go to [email or web address] - password [email or web address] [facility]  Office  [phone]  CC: Primary care physician; [name], MD   Note: This dictation was prepared with Dragon dictation along with smaller phrase technology. Any transcriptional errors that result from this process are unintentional.

## 2018-07-01 NOTE — Anesthesia Post-op Follow-up Note (Signed)
Anesthesia QCDR form completed.        

## 2018-07-01 NOTE — Transfer of Care (Signed)
Immediate Anesthesia Transfer of Care Note  Patient: [name] [name]  Procedure(s) Performed: ESOPHAGOGASTRODUODENOSCOPY (EGD) WITH PROPOFOL (N/A )  Patient Location: Endoscopy Unit  Anesthesia Type:General  Level of Consciousness: drowsy and patient cooperative  Airway & Oxygen Therapy: Patient Spontanous Breathing and Patient connected to nasal cannula oxygen  Post-op Assessment: Report given to RN and Post -op Vital signs reviewed and stable  Post vital signs: Reviewed and stable  Last Vitals:  Vitals Value Taken Time  BP 126/48 07/01/2018 10:47 AM  Temp    Pulse 81 07/01/2018 10:47 AM  Resp 20 07/01/2018 10:47 AM  SpO2 98 % 07/01/2018 10:47 AM  Vitals shown include unvalidated device data.  Last Pain:  Vitals:   07/01/18 0912  TempSrc: Oral  PainSc:          Complications: No apparent anesthesia complications

## 2018-07-01 NOTE — Plan of Care (Signed)
Remains NPO per orders,will continue with planned regimen.

## 2018-07-01 NOTE — Anesthesia Preprocedure Evaluation (Signed)
Anesthesia Evaluation  Patient identified by MRN, date of birth, ID band Patient awake    Reviewed: Allergy & Precautions, H&P , NPO status , Patient's Chart, lab work & pertinent test results  History of Anesthesia Complications Negative for: history of anesthetic complications  Airway Mallampati: III  TM Distance: <3 FB Neck ROM: limited    Dental  (+) Chipped, Poor Dentition   Pulmonary neg shortness of breath, sleep apnea and Continuous Positive Airway Pressure Ventilation , pneumonia, former smoker,           Cardiovascular Exercise Tolerance: Good hypertension, (-) angina+ CAD and + Peripheral Vascular Disease  (-) Past MI + Valvular Problems/Murmurs      Neuro/Psych  Headaches, CVA negative psych ROS   GI/Hepatic negative GI ROS, Neg liver ROS,   Endo/Other  diabetes, Type 2Hypothyroidism   Renal/GU Renal disease  negative genitourinary   Musculoskeletal  (+) Arthritis ,   Abdominal   Peds  Hematology negative hematology ROS (+)   Anesthesia Other Findings Past Medical History: No date: Atherosclerosis of aorta (HCC)     Comment:  by CT scan in past No date: Atrial fibrillation ([place])     Comment:  and aflutter. pt has a left atrial circuit that is not               ablated. was on amiodarone-stopped, now use rate control. No date: Bladder cancer ([place])     Comment:  hx; treated with BCG in past  No date: Carotid artery disease ([place])     Comment:  There was calcified plaque but no stenosis by carotid               artery screening done at [facility]               October, 2013 No date: Diabetes mellitus     Comment:  type not specified No date: Diabetic neuropathy (HCC)     Comment:  feet No date: Elevated liver enzymes     Comment:  over time; hx No date: Excessive sweating No date: Glaucoma No date: Gout No date: Head injury     Comment:  when slipped on ice 2004-2005. stabilized  and back on               Coumadin  No date: Headache     Comment:  migraines - distant past No date: HOCM (hypertrophic obstructive cardiomyopathy) ([place])     Comment:  a. 01/2016 Echo: EF 65-70%, no rwma, LVOT gradient of               80-20mmHg, mod AS, SAM, sev dil LA, PASP 1mmHg; b.               11/2017 Echo: EF 55-60%, near cavity obliteration in               systole, mod AS, mild MS, mod to sev dil LA, nl RV fxn,               PASP 52mmHg. No date: Homocystinemia [facility])     Comment:  elevated, mild  No date: Hypercholesterolemia     Comment:  treated.  No date: Hypertension No date: Kidney mass     Comment:  laproscopic surgery woth cryoablation of a mass outside               kidney followed at [facility].  No date: Moderate aortic stenosis     Comment:  a. 01/2016 echo: mod AS.  No date: Motion sickness     Comment:  ocean boats No date: Nausea No date: Obstructive sleep apnea     Comment:  CPAP started successfully 2014 No date: Orthostatic hypotension     Comment:  a. orthostatic. dehydration. hospitalized 11/11 No date: Sleep apnea     Comment:  Significant obstructive sleep apnea diagnosed in August,              2012, the patient is to receive CPAP  No date: Stroke [facility])     Comment:  "2 old strokes" CT and MRI. [place] [facility] 11/11.               diagnosis was dehydration, no acutal reports.  No date: TSH elevation     Comment:  on synthroid historically  No date: Unsteady gait     Comment:  August, 2012  Past Surgical History: No date: BLADDER SURGERY 05/11/2015: CATARACT EXTRACTION W/PHACO; Left     Comment:  Procedure: CATARACT EXTRACTION PHACO AND INTRAOCULAR               LENS PLACEMENT (IOC);  Surgeon: Leandrew Koyanagi, MD;               Location: [place];  Service: Ophthalmology;                Laterality: Left;  DIABETIC - oral meds, CPAP No date: KIDNEY SURGERY     Comment:  "froze mass" No date: TONSILLECTOMY No date: VALVE  REPLACEMENT  BMI    Body Mass Index:  26.58 kg/m      Reproductive/Obstetrics negative OB ROS                             Anesthesia Physical Anesthesia Plan  ASA: IV  Anesthesia Plan: General   Post-op Pain Management:    Induction: Intravenous  PONV Risk Score and Plan: Propofol infusion and TIVA  Airway Management Planned: Natural Airway and Nasal Cannula  Additional Equipment:   Intra-op Plan:   Post-operative Plan:   Informed Consent: I have reviewed the patients History and Physical, chart, labs and discussed the procedure including the risks, benefits and alternatives for the proposed anesthesia with the patient or authorized representative who has indicated his/her understanding and acceptance.   Dental Advisory Given  Plan Discussed with: Anesthesiologist, CRNA and Surgeon  Anesthesia Plan Comments: (Patient consented for risks of anesthesia including but not limited to:  - adverse reactions to medications - risk of intubation if required - damage to teeth, lips or other oral mucosa - sore throat or hoarseness - Damage to heart, brain, lungs or loss of life  Patient voiced understanding.)        Anesthesia Quick Evaluation

## 2018-07-01 NOTE — Anesthesia Postprocedure Evaluation (Signed)
Anesthesia Post Note  Patient: Jasraj M Linney  Procedure(s) Performed: ESOPHAGOGASTRODUODENOSCOPY (EGD) WITH PROPOFOL (N/A )  Patient location during evaluation: Endoscopy Anesthesia Type: General Level of consciousness: awake and alert Pain management: pain level controlled Vital Signs Assessment: post-procedure vital signs reviewed and stable Respiratory status: spontaneous breathing, nonlabored ventilation, respiratory function stable and patient connected to nasal cannula oxygen Cardiovascular status: blood pressure returned to baseline and stable Postop Assessment: no apparent nausea or vomiting Anesthetic complications: no     Last Vitals:  Vitals:   07/01/18 1116 07/01/18 1126  BP: (!) 85/75 (!) 122/56  Pulse: 88 80  Resp: (!) 26 (!) 25  Temp:    SpO2: 98% 100%    Last Pain:  Vitals:   07/01/18 1126  TempSrc:   PainSc: 0-No pain                 Precious Haws Piscitello

## 2018-07-01 NOTE — Interval H&P Note (Signed)
History and Physical Interval Note:  07/01/2018 10:08 AM  [name] [name]  has presented today for surgery, with the diagnosis of Melena  The various methods of treatment have been discussed with the patient and family. After consideration of risks, benefits and other options for treatment, the patient has consented to  Procedure(s): ESOPHAGOGASTRODUODENOSCOPY (EGD) WITH PROPOFOL (N/A) as a surgical intervention .  The patient's history has been reviewed, patient examined, no change in status, stable for surgery.  I have reviewed the patient's chart and labs.  Questions were answered to the patient's satisfaction.     Salle Brandle [employer]

## 2018-07-01 NOTE — Op Note (Addendum)
[facility] Patient Name: [name] [name] Procedure Date: 07/01/2018 10:23 AM MRN: [id number] Account #: [id number] Date of Birth: 07-03-1934 Admit Type: Outpatient Age: 84 Room: [facility] ENDO ROOM 4 Gender: Male Note Status: Finalized Procedure:            Upper GI endoscopy Indications:          Melena Providers:            [name] MD, MD Referring MD:         Janine Ores. Rosanna Randy, MD (Referring MD) Medicines:            Propofol per Anesthesia Complications:        No immediate complications. Procedure:            Pre-Anesthesia Assessment:                       - Prior to the procedure, a History and Physical was                        performed, and patient medications and allergies were                        reviewed. The patient's tolerance of previous                        anesthesia was also reviewed. The risks and benefits of                        the procedure and the sedation options and risks were                        discussed with the patient. All questions were                        answered, and informed consent was obtained. Prior                        Anticoagulants: The patient has taken Coumadin                        (warfarin), last dose was 2 days prior to procedure.                        ASA Grade Assessment: III - A patient with severe                        systemic disease. After reviewing the risks and                        benefits, the patient was deemed in satisfactory                        condition to undergo the procedure.                       After obtaining informed consent, the endoscope was                        passed under direct vision. Throughout the procedure,  the patient's blood pressure, pulse, and oxygen                        saturations were monitored continuously. The Endoscope                        was introduced through the mouth, and advanced to the              second part of duodenum. Findings:      The examined esophagus was normal.      The entire examined stomach was normal.      One oozing cratered duodenal ulcer with a visible vessel was found in       the duodenal bulb. Hemospray was used to stop the bleeding without any       bleeding at the end of the procedure. Impression:           - Normal esophagus.                       - Normal stomach.                       - One oozing duodenal ulcer with a visible vessel.                       - No specimens collected. Recommendation:       - Return patient to hospital ward for ongoing care.                       - NPO.                       - If any further bleeding then consider vascular                        surgery with embolization. Procedure Code(s):    --- Professional ---                       [id number], Esophagogastroduodenoscopy, flexible, transoral;                        diagnostic, including collection of specimen(s) by                        brushing or washing, when performed (separate procedure) Diagnosis Code(s):    --- Professional ---                       K92.1, Melena (includes Hematochezia)                       K26.4, Chronic or unspecified duodenal ulcer with                        hemorrhage CPT copyright 2017 American Medical Association. All rights reserved. The codes documented in this report are preliminary and upon coder review may  be revised to meet current compliance requirements. [name] MD, MD 07/01/2018 10:42:34 AM This report has been signed electronically. Number of Addenda: 0 Note Initiated On: 07/01/2018 10:23 AM      [facility]

## 2018-07-01 NOTE — Progress Notes (Signed)
This RT was informed by [employer] that pt is not to use home CPAP machine due to frayed wires. We will supply pt with a machine

## 2018-07-01 NOTE — Progress Notes (Signed)
CPAP pressure set to 10 per protocol

## 2018-07-01 NOTE — Progress Notes (Signed)
Pt. will be NPO for 72 hours  from sept 17, 2019 to Sept 20 , 2019 at 12:00 PM.

## 2018-07-01 NOTE — Consult Note (Signed)
[name] [name] for warfarin Indication: mechanical heart valve  Allergies  Allergen Reactions  . Macrolides And Ketolides Other (See Comments)  . Mycinettes   . Nitrofuran Derivatives Other (See Comments)  . Nitrofurantoin Other (See Comments)  . Erythromycin Itching and Rash    Other reaction(s): UNKNOWN  And red skin    Patient Measurements: Height: 5\' 9"  (175.3 cm) Weight: 180 lb (81.6 kg) IBW/kg (Calculated) : 70.7 Heparin Dosing Weight:   Vital Signs: Temp: 97.6 F (36.4 C) (09/17 0422) Temp Source: Oral (09/17 0422) BP: 128/66 (09/17 0422) Pulse Rate: 81 (09/17 0422)  Labs: Recent Labs    06/30/18 0958 06/30/18 1000  07/01/18 0158 07/01/18 0433  HGB  --  8.4*   < > 8.5* 8.5*  HCT  --  24.6*  --   --   --   PLT  --  295  --   --   --   LABPROT  --  48.2*  --   --  48.7*  INR 7.2* 5.30*  --   --  5.37*  CREATININE  --  0.88  --   --  0.73   < > = values in this interval not displayed.    Estimated Creatinine Clearance: 71.2 mL/min (by C-G formula based on SCr of 0.73 mg/dL).   Medical History: Past Medical History:  Diagnosis Date  . Atherosclerosis of aorta ([place])    by CT scan in past  . Atrial fibrillation ([place])    and aflutter. pt has a left atrial circuit that is not ablated. was on amiodarone-stopped, now use rate control.   . Bladder cancer ([place])    hx; treated with BCG in past   . Carotid artery disease ([place])    There was calcified plaque but no stenosis by carotid artery screening done at [facility] October, 2013  . Diabetes mellitus    type not specified  . Diabetic neuropathy (HCC)    feet  . Elevated liver enzymes    over time; hx  . Excessive sweating   . Glaucoma   . Gout   . Head injury    when slipped on ice 2004-2005. stabilized and back on Coumadin   . Headache    migraines - distant past  . HOCM (hypertrophic obstructive cardiomyopathy) ([place])    a. 01/2016 Echo: EF 65-70%, no  rwma, LVOT gradient of 80-76mmHg, mod AS, SAM, sev dil LA, PASP 58mmHg; b. 11/2017 Echo: EF 55-60%, near cavity obliteration in systole, mod AS, mild MS, mod to sev dil LA, nl RV fxn, PASP 67mmHg.  [name] Homocystinemia ([place])    elevated, mild   . Hypercholesterolemia    treated.   . Hypertension   . Kidney mass    laproscopic surgery woth cryoablation of a mass outside kidney followed at [facility].   . Moderate aortic stenosis    a. 01/2016 echo: mod AS.   [name] Motion sickness    ocean boats  . Nausea   . Obstructive sleep apnea    CPAP started successfully 2014  . Orthostatic hypotension    a. orthostatic. dehydration. hospitalized 11/11  . Sleep apnea    Significant obstructive sleep apnea diagnosed in August, 2012, the patient is to receive CPAP   . Stroke [name])    "2 old strokes" CT and MRI. [place] [facility] 11/11. diagnosis was dehydration, no acutal reports.   . TSH elevation    on synthroid historically   . Unsteady gait  August, 2012    Medications:  Scheduled:  . Influenza vac split quadrivalent PF  0.5 mL Intramuscular Tomorrow-1000  . insulin aspart  0-5 Units Subcutaneous QHS  . insulin aspart  0-9 Units Subcutaneous TID WC  . latanoprost  1 drop Both Eyes QHS  . levothyroxine  50 mcg Oral Q0600  . [START ON 07/04/2018] pantoprazole  40 mg Intravenous Q12H  . simvastatin  20 mg Oral q1800    Assessment: Patient is a 84 year old male on warfarin PTA for a mechanical heart valve. However pt was admitted for elevated INR (7.2 outpatient) and black stools. His home dose of warfarin is 2.5 mg daily except 5mg  F/Su. Pt just had valve surgery (myectomy/mechanical AVR/MRV 8/20 at [facility]. Per PA at [facility], no vit K. Pt was transfused in ER. INR is again supratherapeutic today. He is scheduled for an EGD today.  Goal of Therapy:  2.5-3.5 Monitor platelets by anticoagulation protocol: Yes   Plan:  Continue to hold Warfarin. Continue to monitor INR and CBC. No reversal per  cardiology  Vallery Sa, PharmD Clinical Pharmacist 07/01/2018,8:30 AM

## 2018-07-02 DIAGNOSIS — K264 Chronic or unspecified duodenal ulcer with hemorrhage: Principal | ICD-10-CM

## 2018-07-02 LAB — GLUCOSE, CAPILLARY
GLUCOSE-CAPILLARY: 99 mg/dL (ref 70–99)
GLUCOSE-CAPILLARY: 108 mg/dL — AB (ref 70–99)
Glucose-Capillary: 104 mg/dL — ABNORMAL HIGH (ref 70–99)
Glucose-Capillary: 90 mg/dL (ref 70–99)

## 2018-07-02 LAB — PROTIME-INR
INR: 4.8
Prothrombin Time: 44.6 seconds — ABNORMAL HIGH (ref 11.4–15.2)

## 2018-07-02 LAB — HEMOGLOBIN: Hemoglobin: 9 g/dL — ABNORMAL LOW (ref 13.0–18.0)

## 2018-07-02 MED ORDER — SODIUM CHLORIDE 0.9 % IV SOLN
300.0000 mg | Freq: Once | INTRAVENOUS | Status: AC
  Administered 2018-07-02: 300 mg via INTRAVENOUS
  Filled 2018-07-02: qty 15

## 2018-07-02 NOTE — Evaluation (Signed)
Physical Therapy Evaluation Patient Details Name: [name] [name] MRN: [id number] DOB: 26-Jun-1934 Today's Date: 07/02/2018   History of Present Illness  presented to ER secondary to generalized weakness, melena; admitted with acute GIB, supratherapeutic INR (admit 7, current 4.8).  Upper endoscopy significnant for duodenal ulcer, injected; no recurrent active bleeding noted per RN.  Of note, recent cardiac valve replacement with PPM (8/20); maintains sternal precautions at this time.  Clinical Impression  Upon evaluation, patient alert and oriented; follows commands and demonstrates good insight/safety awareness.  Good recall and adherence to sternal precautions noted throughout session.  Bilat UE/LE strength and ROM grossly symmetrical and WFL; denies pain.  Able to complete bed mobility with mod indep; sit/stand, basic transfers and gait (360') with RW, cga/close sup.  Slow, but steady, gait performance without buckling, LOB or safety concern.  Vitals stable and WFL; BORG 4/10 with exertion. Would benefit from skilled PT to address above deficits and promote optimal return to PLOF;d Recommend transition to HHPT upon discharge from acute hospitalization.     Follow Up Recommendations Home health PT(would benefit from transition to outpatient cardiac rehab program as medically appropriate)    Equipment Recommendations  Rolling walker with 5" wheels    Recommendations for Other Services       Precautions / Restrictions Precautions Precautions: Sternal;Fall;ICD/Pacemaker Restrictions Weight Bearing Restrictions: No      Mobility  Bed Mobility Overal bed mobility: Modified Independent             General bed mobility comments: good adherence to sternal precautions  Transfers Overall transfer level: Needs assistance Equipment used: Rolling walker (2 wheeled) Transfers: Sit to/from Stand Sit to Stand: Min guard         General transfer comment: prefers bilat UEs folded  against chest to maintain sternal precautions  Ambulation/Gait Ambulation/Gait assistance: Min guard;Supervision Gait Distance (Feet): 360 Feet Assistive device: Rolling walker (2 wheeled)   Gait velocity: 10' walk time, 8-9 seconds   General Gait Details: reciprocal stepping pattern with good step height/lenght, good postural extension.  Steady cadence and gait speed without buckling or LOB.  BORG 4/10; good awareness of signs/symptoms of fatigue.  Stairs            Wheelchair Mobility    Modified Rankin (Stroke Patients Only)       Balance Overall balance assessment: Needs assistance Sitting-balance support: No upper extremity supported;Feet supported Sitting balance-Leahy Scale: Good     Standing balance support: Bilateral upper extremity supported Standing balance-Leahy Scale: Fair                               Pertinent Vitals/Pain Pain Assessment: No/denies pain    Home Living Family/patient expects to be discharged to:: Private residence Living Arrangements: Spouse/significant other Available Help at Discharge: Family Type of Home: House Home Access: Stairs to enter Entrance Stairs-Rails: None Entrance Stairs-Number of Steps: 3 Home Layout: Two level;Able to live on main level with bedroom/bathroom Home Equipment: Gilford Rile - 4 wheels      Prior Function           Comments: Mod indep with [id number] for household and limited community mobilizaiton ;denies fall history.     Hand Dominance        Extremity/Trunk Assessment   Upper Extremity Assessment Upper Extremity Assessment: Overall WFL for tasks assessed    Lower Extremity Assessment Lower Extremity Assessment: Overall WFL for tasks assessed(grossly 4/5 throughout)  Communication   Communication: No difficulties  Cognition Arousal/Alertness: Awake/alert Behavior During Therapy: WFL for tasks assessed/performed Overall Cognitive Status: Within Functional Limits for tasks  assessed                                        General Comments      Exercises     Assessment/Plan    PT Assessment Patient needs continued PT services  PT Problem List Decreased activity tolerance;Decreased balance;Decreased mobility;Decreased knowledge of use of DME;Decreased knowledge of precautions;Decreased safety awareness;Cardiopulmonary status limiting activity       PT Treatment Interventions DME instruction;Stair training;Therapeutic activities;Balance training;Gait training;Functional mobility training;Therapeutic exercise;Patient/family education    PT Goals (Current goals can be found in the Care Plan section)  Acute Rehab PT Goals Patient Stated Goal: to return home PT Goal Formulation: With patient Time For Goal Achievement: 07/16/18 Potential to Achieve Goals: Good    Frequency Min 2X/week   Barriers to discharge        Co-evaluation               AM-PAC PT "6 Clicks" Daily Activity  Outcome Measure Difficulty turning over in bed (including adjusting bedclothes, sheets and blankets)?: None Difficulty moving from lying on back to sitting on the side of the bed? : None Difficulty sitting down on and standing up from a chair with arms (e.g., wheelchair, bedside commode, etc,.)?: Unable Help needed moving to and from a bed to chair (including a wheelchair)?: A Little Help needed walking in hospital room?: A Little Help needed climbing 3-5 steps with a railing? : A Little 6 Click Score: 18    End of Session Equipment Utilized During Treatment: Gait belt Activity Tolerance: Patient tolerated treatment well Patient left: in bed;with call bell/phone within reach;with bed alarm set Nurse Communication: Mobility status PT Visit Diagnosis: Difficulty in walking, not elsewhere classified (R26.2)    Time: 1791-5056 PT Time Calculation (min) (ACUTE ONLY): 34 min   Charges:   PT Evaluation $PT Eval Moderate Complexity: 1 Mod PT  Treatments $Gait Training: 8-22 mins      Addysyn Fern H. Owens Shark, PT, DPT, NCS 07/02/18, 3:10 PM [phone]

## 2018-07-02 NOTE — Consult Note (Signed)
[place] for warfarin Indication: mechanical heart valve  Allergies  Allergen Reactions  . Macrolides And Ketolides Other (See Comments)  . Mycinettes   . Nitrofuran Derivatives Other (See Comments)  . Nitrofurantoin Other (See Comments)  . Erythromycin Itching and Rash    Other reaction(s): UNKNOWN  And red skin    Patient Measurements: Height: 5\' 9"  (175.3 cm) Weight: 180 lb (81.6 kg) IBW/kg (Calculated) : 70.7  Vital Signs: Temp: 98.4 F (36.9 C) (09/18 0436) Temp Source: Oral (09/18 0436) BP: 131/62 (09/18 0436) Pulse Rate: 83 (09/18 0436)  Labs: Recent Labs    06/30/18 1000  07/01/18 0433 07/02/18 0459  HGB 8.4*   < > 8.5* 9.0*  HCT 24.6*  --   --   --   PLT 295  --   --   --   LABPROT 48.2*  --  48.7* 44.6*  INR 5.30*  --  5.37* 4.80*  CREATININE 0.88  --  0.73  --    < > = values in this interval not displayed.    Estimated Creatinine Clearance: 71.2 mL/min (by C-G formula based on SCr of 0.73 mg/dL).   Medical History: Past Medical History:  Diagnosis Date  . Atherosclerosis of aorta ([place])    by CT scan in past  . Atrial fibrillation ([place])    and aflutter. pt has a left atrial circuit that is not ablated. was on amiodarone-stopped, now use rate control.   . Bladder cancer ([place])    hx; treated with BCG in past   . Carotid artery disease ([place])    There was calcified plaque but no stenosis by carotid artery screening done at [facility] October, 2013  . Diabetes mellitus    type not specified  . Diabetic neuropathy (HCC)    feet  . Elevated liver enzymes    over time; hx  . Excessive sweating   . Glaucoma   . Gout   . Head injury    when slipped on ice 2004-2005. stabilized and back on Coumadin   . Headache    migraines - distant past  . HOCM (hypertrophic obstructive cardiomyopathy) ([place])    a. 01/2016 Echo: EF 65-70%, no rwma, LVOT gradient of 80-20mmHg, mod AS, SAM, sev dil LA, PASP  74mmHg; b. 11/2017 Echo: EF 55-60%, near cavity obliteration in systole, mod AS, mild MS, mod to sev dil LA, nl RV fxn, PASP 49mmHg.  [name] Homocystinemia ([place])    elevated, mild   . Hypercholesterolemia    treated.   . Hypertension   . Kidney mass    laproscopic surgery woth cryoablation of a mass outside kidney followed at [facility].   . Moderate aortic stenosis    a. 01/2016 echo: mod AS.   [name] Motion sickness    ocean boats  . Nausea   . Obstructive sleep apnea    CPAP started successfully 2014  . Orthostatic hypotension    a. orthostatic. dehydration. hospitalized 11/11  . Sleep apnea    Significant obstructive sleep apnea diagnosed in August, 2012, the patient is to receive CPAP   . Stroke [facility])    "2 old strokes" CT and MRI. [place] [facility] 11/11. diagnosis was dehydration, no acutal reports.   . TSH elevation    on synthroid historically   . Unsteady gait    August, 2012    Medications:  Scheduled:  . Influenza vac split quadrivalent PF  0.5 mL Intramuscular Tomorrow-1000  . insulin  aspart  0-5 Units Subcutaneous QHS  . insulin aspart  0-9 Units Subcutaneous TID WC  . latanoprost  1 drop Both Eyes QHS  . levothyroxine  50 mcg Oral Q0600  . [START ON 07/04/2018] pantoprazole  40 mg Intravenous Q12H  . simvastatin  20 mg Oral q1800    Assessment: Patient is a 84 year old male on warfarin PTA for a mechanical heart valve. However pt was admitted for elevated INR (7.2 outpatient) and black stools. His home dose of warfarin is 2.5 mg daily except 5mg  F/Su. Pt just had valve surgery (myectomy/mechanical AVR/MRV 8/20 at [facility]. Per PA at [facility], no vit K. Pt was transfused in ER. INR is again supratherapeutic today.     INR  Dose 9/16  5.30  Hold 9/17  5.37  Hold 9/18  4.80  Hold  Goal of Therapy:  2.5-3.5 Monitor platelets by anticoagulation protocol: Yes   Plan:  Continue to hold Warfarin. Continue to monitor INR and CBC. No reversal per cardiology  Vallery Sa,  PharmD Clinical Pharmacist 07/02/2018,8:24 AM

## 2018-07-02 NOTE — Progress Notes (Signed)
CPAP pressure set to 10 per protocol. Patient has home CPAP in room, however, [employer] did not approve due to frayed wires. Dreamstation provided by hospital for patient use.

## 2018-07-02 NOTE — Progress Notes (Signed)
Pt INR 4.80 reported to hospitalist on call,no new orders received.call ended.

## 2018-07-02 NOTE — Progress Notes (Signed)
  [name] Lame, MD [facility]   [address], [place], [place] [address] Phone: [phone] Fax : [phone]   Subjective: Patient has had no further sign of any GI bleeding.  Patient's hemoglobin has been stable.  The patient did have a upper endoscopy with a large oozing duodenal ulcer with visible vessel.  The patient had heme spray used to get the bleeding under control.  The patient has been kept n.p.o. today.  He has no reports of any abdominal pain nausea vomiting.   Objective: Vital signs in last 24 hours: Vitals:   07/01/18 2017 07/01/18 2239 07/02/18 0436 07/02/18 1145  BP: 135/74  131/62 (!) 140/56  Pulse: 82 79 83 80  Resp: 19 16 18 18   Temp: 99.1 F (37.3 C)  98.4 F (36.9 C) 98.3 F (36.8 C)  TempSrc: Oral  Oral Oral  SpO2: 93% 95% 96% 95%  Weight:      Height:       Weight change:   Intake/Output Summary (Last 24 hours) at 07/02/2018 1338 Last data filed at 07/02/2018 1106 Gross per 24 hour  Intake 1883.84 ml  Output 1575 ml  Net 308.84 ml     Exam: Heart:: Regular rate and rhythm, S1S2 present or without murmur or extra heart sounds Lungs: normal and clear to auscultation and percussion Abdomen: soft, nontender, normal bowel sounds   Lab Results: @LABTEST2 @ Micro Results: No results found for this or any previous visit (from the past 240 hour(s)). Studies/Results: No results found. Medications: I have reviewed the patient's current medications. Scheduled Meds: . Influenza vac split quadrivalent PF  0.5 mL Intramuscular Tomorrow-1000  . insulin aspart  0-5 Units Subcutaneous QHS  . insulin aspart  0-9 Units Subcutaneous TID WC  . latanoprost  1 drop Both Eyes QHS  . levothyroxine  50 mcg Oral Q0600  . [START ON 07/04/2018] pantoprazole  40 mg Intravenous Q12H  . simvastatin  20 mg Oral q1800   Continuous Infusions: . dextrose 5 % and 0.9% NaCl Stopped (07/02/18 1036)  . pantoprozole (PROTONIX) infusion 8 mg/hr (07/02/18 1042)   PRN  Meds:.acetaminophen **OR** acetaminophen, albuterol, bisacodyl, HYDROcodone-acetaminophen, ondansetron **OR** ondansetron (ZOFRAN) IV, senna-docusate   Assessment: Active Problems:   GIB (gastrointestinal bleeding)   Acute upper GI bleed   Symptomatic anemia   Blood in stool   Bleeding duodenal ulcer    Plan: This patient had a large duodenal ulcer that was oozing with a visible vessel.  The patient is doing well at the present time.  The patient INR has not come down significantly today.  The patient has no further sign of bleeding.  I would keep the patient n.p.o. today again and watch for any signs of bleeding. If the patient should have further bleeding then interventional radiology versus vascular surgery should be contacted for embolization.  The patient has been explained the plan and agrees with it.   LOS: 2 days   [name] [name] 07/02/2018, 1:38 PM

## 2018-07-02 NOTE — Progress Notes (Signed)
Patient ID: [name] [name], male   DOB: [birth year], 84 y.o.   MRN: [id number]  [employer] PROGRESS NOTE  [name] [name] DPO:[id number] DOB: [birth year] DOA: 06/30/2018 PCP: [name] Banana., MD  HPI/Subjective: Patient feeling okay.  No nausea or vomiting.  Only one bowel movement since being here.  No abdominal pain.  Objective: Vitals:   07/01/18 2239 07/02/18 0436  BP:  131/62  Pulse: 79 83  Resp: 16 18  Temp:  98.4 F (36.9 C)  SpO2: 95% 96%    Filed Weights   06/30/18 0952  Weight: 81.6 kg    ROS: Review of Systems  Constitutional: Negative for chills and fever.  Eyes: Negative for blurred vision.  Respiratory: Negative for cough and shortness of breath.   Cardiovascular: Negative for chest pain.  Gastrointestinal: Negative for abdominal pain, constipation, diarrhea, nausea and vomiting.  Genitourinary: Negative for dysuria.  Musculoskeletal: Negative for joint pain.  Neurological: Negative for dizziness and headaches.   Exam: Physical Exam  Constitutional: He is oriented to person, place, and time.  HENT:  Nose: No mucosal edema.  Mouth/Throat: No oropharyngeal exudate or posterior oropharyngeal edema.  Eyes: Pupils are equal, round, and reactive to light. Conjunctivae, EOM and lids are normal.  Neck: No JVD present. Carotid bruit is not present. No edema present. No thyroid mass and no thyromegaly present.  Cardiovascular: S1 normal and S2 normal. Exam reveals no gallop.  No murmur heard. Pulses:      Dorsalis pedis pulses are 2+ on the right side, and 2+ on the left side.  Respiratory: No respiratory distress. He has no wheezes. He has no rhonchi. He has no rales.  GI: Soft. Bowel sounds are normal. There is no tenderness.  Musculoskeletal:       Right ankle: He exhibits no swelling.       Left ankle: He exhibits no swelling.  Lymphadenopathy:    He has no cervical adenopathy.  Neurological: He is alert and oriented to person, place, and  time. No cranial nerve deficit.  Skin: Skin is warm. No rash noted. Nails show no clubbing.  Psychiatric: He has a normal mood and affect.      Data Reviewed: Basic Metabolic Panel: Recent Labs  Lab 06/30/18 1000 07/01/18 0433  NA 135 137  K 3.8 4.0  CL 101 106  CO2 25 24  GLUCOSE 152* 93  BUN 15 12  CREATININE 0.88 0.73  CALCIUM 8.8* 8.5*   Liver Function Tests: Recent Labs  Lab 06/30/18 1000  AST 48*  ALT 36  ALKPHOS 81  BILITOT 0.5  PROT 7.8  ALBUMIN 3.3*   CBC: Recent Labs  Lab 06/30/18 1000 06/30/18 1446 06/30/18 2014 07/01/18 0158 07/01/18 0433 07/02/18 0459  WBC 9.9  --   --   --   --   --   NEUTROABS 6.8*  --   --   --   --   --   HGB 8.4* 8.3* 8.7* 8.5* 8.5* 9.0*  HCT 24.6*  --   --   --   --   --   MCV 93.4  --   --   --   --   --   PLT 295  --   --   --   --   --     CBG: Recent Labs  Lab 07/01/18 0732 07/01/18 1200 07/01/18 1634 07/01/18 2113 07/02/18 0744  GLUCAP 98 87 95 96 104*    Scheduled Meds: .  Influenza vac split quadrivalent PF  0.5 mL Intramuscular Tomorrow-1000  . insulin aspart  0-5 Units Subcutaneous QHS  . insulin aspart  0-9 Units Subcutaneous TID WC  . latanoprost  1 drop Both Eyes QHS  . levothyroxine  50 mcg Oral Q0600  . [START ON 07/04/2018] pantoprazole  40 mg Intravenous Q12H  . simvastatin  20 mg Oral q1800   Continuous Infusions: . dextrose 5 % and 0.9% NaCl 50 mL/hr at 07/01/18 1840  . iron sucrose    . pantoprozole (PROTONIX) infusion 8 mg/hr (07/01/18 1840)    Assessment/Plan:  1. Duodenal ulcer with visible blood vessel.  Still n.p.o. on IV Protonix drip.  Diet as per gastroenterology.  Serial hemoglobins. 2. Acute blood loss anemia.  Today's hemoglobin up at 9.0.  Give IV Venofer. 3. Metallic valve on Coumadin.  INR still elevated.  Pharmacy to dose Coumadin when able. 4. Hypothyroidism unspecified on levothyroxine 5. Hyperlipidemia unspecified on simvastatin 6. Glaucoma unspecified on  latanoprost 7. Type 2 diabetes mellitus.  Actually on D5 drip.  Sugars on the lower side.  8. Physical therapy evaluation  Code Status:     Code Status Orders  (From admission, onward)         Start     Ordered   06/30/18 1422  Full code  Continuous     06/30/18 1421        Code Status History    Date Active Date Inactive Code Status Order ID Comments User Context   09/18/2017 2314 09/21/2017 1702 Full Code [id number]  Lance Coon, MD Inpatient    Advance Directive Documentation     Most Recent Value  Type of Advance Directive  Living will  Pre-existing out of facility DNR order (yellow form or pink MOST form)  -  "MOST" Form in Place?  -     Disposition Plan: Would like to see INR come down.  Need to watch closely for bleeding with visible vessel seen on ulcer with the patient being on Coumadin  Consultants:  Gastroenterology  Procedures:  EGD  Time spent: 28 minutes  [place]

## 2018-07-03 LAB — GLUCOSE, CAPILLARY
GLUCOSE-CAPILLARY: 110 mg/dL — AB (ref 70–99)
GLUCOSE-CAPILLARY: 214 mg/dL — AB (ref 70–99)
Glucose-Capillary: 155 mg/dL — ABNORMAL HIGH (ref 70–99)
Glucose-Capillary: 122 mg/dL — ABNORMAL HIGH (ref 70–99)

## 2018-07-03 LAB — PROTIME-INR
INR: 4.56
PROTHROMBIN TIME: 42.9 s — AB (ref 11.4–15.2)

## 2018-07-03 LAB — HEMOGLOBIN: HEMOGLOBIN: 9.1 g/dL — AB (ref 13.0–18.0)

## 2018-07-03 NOTE — Care Management (Signed)
Patient admitted for GI bleed. Patient lives at home with wife.  PCP Rosanna Randy. PT has assessed patient and recommends home health PT.  Patient is open with [facility] for RN, PT, and OT.  Will need resumption orders at discharge. Patient has rollator RNCM following

## 2018-07-03 NOTE — Progress Notes (Signed)
Patient ID: [name] [name], male   DOB: [birth year], 84 y.o.   MRN: [id number]  [employer] PROGRESS NOTE  [name] [name] CVE:[id number] DOB: [birth year] DOA: 06/30/2018 PCP: [name], MD  HPI/Subjective: Patient feels fine.  No bowel movement since when he came in.  Tolerated liquids this morning.  Objective: Vitals:   07/02/18 2109 07/03/18 0428  BP: (!) 142/55 140/65  Pulse: 81 80  Resp: 16 15  Temp: 98.9 F (37.2 C) 98.3 F (36.8 C)  SpO2: 93% 92%    Filed Weights   06/30/18 0952  Weight: 81.6 kg    ROS: Review of Systems  Constitutional: Negative for chills and fever.  Eyes: Negative for blurred vision.  Respiratory: Negative for cough and shortness of breath.   Cardiovascular: Negative for chest pain.  Gastrointestinal: Negative for abdominal pain, constipation, diarrhea, nausea and vomiting.  Genitourinary: Negative for dysuria.  Musculoskeletal: Negative for joint pain.  Neurological: Negative for dizziness and headaches.   Exam: Physical Exam  Constitutional: He is oriented to person, place, and time.  HENT:  Nose: No mucosal edema.  Mouth/Throat: No oropharyngeal exudate or posterior oropharyngeal edema.  Eyes: Pupils are equal, round, and reactive to light. Conjunctivae, EOM and lids are normal.  Neck: No JVD present. Carotid bruit is not present. No edema present. No thyroid mass and no thyromegaly present.  Cardiovascular: S1 normal and S2 normal. Exam reveals no gallop.  No murmur heard. Pulses:      Dorsalis pedis pulses are 2+ on the right side, and 2+ on the left side.  Respiratory: No respiratory distress. He has no wheezes. He has no rhonchi. He has no rales.  GI: Soft. Bowel sounds are normal. There is no tenderness.  Musculoskeletal:       Right ankle: He exhibits no swelling.       Left ankle: He exhibits no swelling.  Lymphadenopathy:    He has no cervical adenopathy.  Neurological: He is alert and oriented to person,  place, and time. No cranial nerve deficit.  Skin: Skin is warm. No rash noted. Nails show no clubbing.  Psychiatric: He has a normal mood and affect.      Data Reviewed: Basic Metabolic Panel: Recent Labs  Lab 06/30/18 1000 07/01/18 0433  NA 135 137  K 3.8 4.0  CL 101 106  CO2 25 24  GLUCOSE 152* 93  BUN 15 12  CREATININE 0.88 0.73  CALCIUM 8.8* 8.5*   Liver Function Tests: Recent Labs  Lab 06/30/18 1000  AST 48*  ALT 36  ALKPHOS 81  BILITOT 0.5  PROT 7.8  ALBUMIN 3.3*   CBC: Recent Labs  Lab 06/30/18 1000  06/30/18 2014 07/01/18 0158 07/01/18 0433 07/02/18 0459 07/03/18 0444  WBC 9.9  --   --   --   --   --   --   NEUTROABS 6.8*  --   --   --   --   --   --   HGB 8.4*   < > 8.7* 8.5* 8.5* 9.0* 9.1*  HCT 24.6*  --   --   --   --   --   --   MCV 93.4  --   --   --   --   --   --   PLT 295  --   --   --   --   --   --    < > = values in this interval not  displayed.    CBG: Recent Labs  Lab 07/02/18 1142 07/02/18 1645 07/02/18 2115 07/03/18 0729 07/03/18 1139  GLUCAP 90 99 108* 110* 155*    Scheduled Meds: . Influenza vac split quadrivalent PF  0.5 mL Intramuscular Tomorrow-1000  . insulin aspart  0-5 Units Subcutaneous QHS  . insulin aspart  0-9 Units Subcutaneous TID WC  . latanoprost  1 drop Both Eyes QHS  . levothyroxine  50 mcg Oral Q0600  . [START ON 07/04/2018] pantoprazole  40 mg Intravenous Q12H  . simvastatin  20 mg Oral q1800   Continuous Infusions:   Assessment/Plan:  1. Duodenal ulcer with visible blood vessel.  Clear liquid diet started this morning.  Protonix drip to be switched to twice daily Protonix today.  High risk for rebleeding. 2. Acute blood loss anemia.  Today's hemoglobin up at 9.1.  Status post IV Venofer yesterday 3. Metallic valve on Coumadin.  INR still elevated.  Pharmacy has held Coumadin dosing since the patient has come in.  Check INR daily. 4. Hypothyroidism unspecified on levothyroxine 5. Hyperlipidemia  unspecified on simvastatin 6. Glaucoma unspecified on latanoprost 7. Type 2 diabetes mellitus.  Sliding scale insulin and DC IV fluids 8. Physical therapy evaluation, recommends home health PT with rolling walker  Code Status:     Code Status Orders  (From admission, onward)         Start     Ordered   06/30/18 1422  Full code  Continuous     06/30/18 1421        Code Status History    Date Active Date Inactive Code Status Order ID Comments User Context   09/18/2017 2314 09/21/2017 1702 Full Code [id number]  Lance Coon, MD Inpatient    Advance Directive Documentation     Most Recent Value  Type of Advance Directive  Living will  Pre-existing out of facility DNR order (yellow form or pink MOST form)  -  "MOST" Form in Place?  -     Disposition Plan: Would like to see INR come down.  Need to watch closely for bleeding with visible vessel seen on ulcer with the patient being on Coumadin  Consultants:  Gastroenterology  Procedures:  EGD  Time spent: 27 minutes  [place]

## 2018-07-03 NOTE — Consult Note (Signed)
[place] for warfarin Indication: mechanical heart valve  Allergies  Allergen Reactions  . Macrolides And Ketolides Other (See Comments)  . Mycinettes   . Nitrofuran Derivatives Other (See Comments)  . Nitrofurantoin Other (See Comments)  . Erythromycin Itching and Rash    Other reaction(s): UNKNOWN  And red skin    Patient Measurements: Height: 5\' 9"  (175.3 cm) Weight: 180 lb (81.6 kg) IBW/kg (Calculated) : 70.7  Vital Signs: Temp: 98.3 F (36.8 C) (09/19 0428) Temp Source: Oral (09/19 0428) BP: 140/65 (09/19 0428) Pulse Rate: 80 (09/19 0428)  Labs: Recent Labs    06/30/18 1000  07/01/18 0433 07/02/18 0459 07/03/18 0444  HGB 8.4*   < > 8.5* 9.0* 9.1*  HCT 24.6*  --   --   --   --   PLT 295  --   --   --   --   LABPROT 48.2*  --  48.7* 44.6* 42.9*  INR 5.30*  --  5.37* 4.80* 4.56*  CREATININE 0.88  --  0.73  --   --    < > = values in this interval not displayed.    Estimated Creatinine Clearance: 71.2 mL/min (by C-G formula based on SCr of 0.73 mg/dL).   Medical History: Past Medical History:  Diagnosis Date  . Atherosclerosis of aorta ([place])    by CT scan in past  . Atrial fibrillation ([place])    and aflutter. pt has a left atrial circuit that is not ablated. was on amiodarone-stopped, now use rate control.   . Bladder cancer ([place])    hx; treated with BCG in past   . Carotid artery disease ([place])    There was calcified plaque but no stenosis by carotid artery screening done at [facility] October, 2013  . Diabetes mellitus    type not specified  . Diabetic neuropathy (HCC)    feet  . Elevated liver enzymes    over time; hx  . Excessive sweating   . Glaucoma   . Gout   . Head injury    when slipped on ice 2004-2005. stabilized and back on Coumadin   . Headache    migraines - distant past  . HOCM (hypertrophic obstructive cardiomyopathy) ([place])    a. 01/2016 Echo: EF 65-70%, no rwma, LVOT gradient  of 80-96mmHg, mod AS, SAM, sev dil LA, PASP 14mmHg; b. 11/2017 Echo: EF 55-60%, near cavity obliteration in systole, mod AS, mild MS, mod to sev dil LA, nl RV fxn, PASP 71mmHg.  [name] Homocystinemia ([place])    elevated, mild   . Hypercholesterolemia    treated.   . Hypertension   . Kidney mass    laproscopic surgery woth cryoablation of a mass outside kidney followed at [facility].   . Moderate aortic stenosis    a. 01/2016 echo: mod AS.   [name] Motion sickness    ocean boats  . Nausea   . Obstructive sleep apnea    CPAP started successfully 2014  . Orthostatic hypotension    a. orthostatic. dehydration. hospitalized 11/11  . Sleep apnea    Significant obstructive sleep apnea diagnosed in August, 2012, the patient is to receive CPAP   . Stroke [facility])    "2 old strokes" CT and MRI. [place] [facility] 11/11. diagnosis was dehydration, no acutal reports.   . TSH elevation    on synthroid historically   . Unsteady gait    August, 2012    Medications:  Scheduled:  .  Influenza vac split quadrivalent PF  0.5 mL Intramuscular Tomorrow-1000  . insulin aspart  0-5 Units Subcutaneous QHS  . insulin aspart  0-9 Units Subcutaneous TID WC  . latanoprost  1 drop Both Eyes QHS  . levothyroxine  50 mcg Oral Q0600  . [START ON 07/04/2018] pantoprazole  40 mg Intravenous Q12H  . simvastatin  20 mg Oral q1800    Assessment: Patient is a 84 year old male on warfarin PTA for a mechanical heart valve. However pt was admitted for elevated INR (7.2 outpatient) and black stools. His home dose of warfarin is 2.5 mg daily except 5mg  F/Su. Pt just had valve surgery (myectomy/mechanical AVR/MRV 8/20 at [facility]. Per PA at [facility], no vit K. Pt was transfused in ER. INR is again supratherapeutic today. Dr Allen Norris notes no active bleeding with stable Hgb. He received 300 mg iron sucrose on 9/18.    INR  Dose 9/16  5.30  Hold 9/17  5.37  Hold 9/18  4.80  Hold 9/19  4.56  Hold  Goal of Therapy:  2.5-3.5 Monitor platelets by  anticoagulation protocol: Yes   Plan:  Continue to hold Warfarin. Continue to monitor INR and CBC.   Vallery Sa, PharmD Clinical Pharmacist 07/03/2018,7:56 AM

## 2018-07-03 NOTE — Progress Notes (Signed)
  [name] Lame, MD [facility]   [address], [place], [place] [address] Phone: [phone] Fax : [phone]   Subjective: The patient denies any abdominal pain today.  The patient has had no further black stools and states that he had a normal colored stool today.  There is no report of any nausea vomiting.  The patient was started on a clear liquid diet today and has been doing well.  The patient's hemoglobin has remained stable.  His INR has not come down significantly since admission.   Objective: Vital signs in last 24 hours: Vitals:   07/02/18 0436 07/02/18 1145 07/02/18 2109 07/03/18 0428  BP: 131/62 (!) 140/56 (!) 142/55 140/65  Pulse: 83 80 81 80  Resp: 18 18 16 15   Temp: 98.4 F (36.9 C) 98.3 F (36.8 C) 98.9 F (37.2 C) 98.3 F (36.8 C)  TempSrc: Oral Oral Oral Oral  SpO2: 96% 95% 93% 92%  Weight:      Height:       Weight change:   Intake/Output Summary (Last 24 hours) at 07/03/2018 1534 Last data filed at 07/03/2018 1400 Gross per 24 hour  Intake 2231.94 ml  Output 1275 ml  Net 956.94 ml     Exam: Heart:: Regular rate and rhythm, S1S2 present or without murmur or extra heart sounds Lungs: normal and clear to auscultation and percussion Abdomen: soft, nontender, normal bowel sounds   Lab Results: @LABTEST2 @ Micro Results: No results found for this or any previous visit (from the past 240 hour(s)). Studies/Results: No results found. Medications: I have reviewed the patient's current medications. Scheduled Meds: . Influenza vac split quadrivalent PF  0.5 mL Intramuscular Tomorrow-1000  . insulin aspart  0-5 Units Subcutaneous QHS  . insulin aspart  0-9 Units Subcutaneous TID WC  . latanoprost  1 drop Both Eyes QHS  . levothyroxine  50 mcg Oral Q0600  . [START ON 07/04/2018] pantoprazole  40 mg Intravenous Q12H  . simvastatin  20 mg Oral q1800   Continuous Infusions: PRN Meds:.acetaminophen **OR** acetaminophen, albuterol, bisacodyl,  HYDROcodone-acetaminophen, ondansetron **OR** ondansetron (ZOFRAN) IV, senna-docusate   Assessment: Active Problems:   GIB (gastrointestinal bleeding)   Acute upper GI bleed   Symptomatic anemia   Blood in stool   Bleeding duodenal ulcer    Plan: This patient comes in with a large duodenal ulcer with visible vessel that was treated with hemo-spray due to his prolonged INR.  The patient will continue on a clear liquid diet today and can be advanced to a soft diet tomorrow.  The patient has been explained the plan and agrees with it.   LOS: 3 days   [name] [name] 07/03/2018, 3:34 PM

## 2018-07-03 NOTE — Care Management Important Message (Signed)
Copy of signed IM left with patient in room.  

## 2018-07-04 LAB — GLUCOSE, CAPILLARY
GLUCOSE-CAPILLARY: 117 mg/dL — AB (ref 70–99)
GLUCOSE-CAPILLARY: 119 mg/dL — AB (ref 70–99)
Glucose-Capillary: 145 mg/dL — ABNORMAL HIGH (ref 70–99)
Glucose-Capillary: 141 mg/dL — ABNORMAL HIGH (ref 70–99)

## 2018-07-04 LAB — PROTIME-INR
INR: 3.69
Prothrombin Time: 36.3 seconds — ABNORMAL HIGH (ref 11.4–15.2)

## 2018-07-04 LAB — HEMOGLOBIN: Hemoglobin: 9.9 g/dL — ABNORMAL LOW (ref 13.0–18.0)

## 2018-07-04 MED ORDER — WARFARIN SODIUM 2 MG PO TABS
2.0000 mg | ORAL_TABLET | Freq: Once | ORAL | Status: AC
  Administered 2018-07-04: 2 mg via ORAL
  Filled 2018-07-04: qty 1

## 2018-07-04 MED ORDER — WARFARIN - PHARMACIST DOSING INPATIENT
Freq: Every day | Status: DC
  Administered 2018-07-04: 18:00:00

## 2018-07-04 NOTE — Progress Notes (Signed)
Patient ID: [name] [name], male   DOB: 01/18/1934, 84 y.o.   MRN: [id number]  [employer] PROGRESS NOTE  [name]:[id number] DOB: 12-Sep-1934 DOA: 06/30/2018 PCP: [name], MD  HPI/Subjective: Patient feeling fine.  No nausea or vomiting.  No abdominal pain.  Patient's wife stated he had a bowel movement that was brown.  Objective: Vitals:   07/04/18 0507 07/04/18 1100  BP: (!) 124/56 (!) 131/59  Pulse: 79 80  Resp: 20 13  Temp: 98.3 F (36.8 C) 98.4 F (36.9 C)  SpO2: 95% 96%    Filed Weights   06/30/18 0952  Weight: 81.6 kg    ROS: Review of Systems  Constitutional: Negative for chills and fever.  Eyes: Negative for blurred vision.  Respiratory: Negative for cough and shortness of breath.   Cardiovascular: Negative for chest pain.  Gastrointestinal: Negative for abdominal pain, constipation, diarrhea, nausea and vomiting.  Genitourinary: Negative for dysuria.  Musculoskeletal: Negative for joint pain.  Neurological: Negative for dizziness and headaches.   Exam: Physical Exam  Constitutional: He is oriented to person, place, and time.  HENT:  Nose: No mucosal edema.  Mouth/Throat: No oropharyngeal exudate or posterior oropharyngeal edema.  Eyes: Pupils are equal, round, and reactive to light. Conjunctivae, EOM and lids are normal.  Neck: No JVD present. Carotid bruit is not present. No edema present. No thyroid mass and no thyromegaly present.  Cardiovascular: S1 normal and S2 normal. Exam reveals no gallop.  No murmur heard. Pulses:      Dorsalis pedis pulses are 2+ on the right side, and 2+ on the left side.  Respiratory: No respiratory distress. He has no wheezes. He has no rhonchi. He has no rales.  GI: Soft. Bowel sounds are normal. There is no tenderness.  Musculoskeletal:       Right ankle: He exhibits no swelling.       Left ankle: He exhibits no swelling.  Lymphadenopathy:    He has no cervical adenopathy.   Neurological: He is alert and oriented to person, place, and time. No cranial nerve deficit.  Skin: Skin is warm. No rash noted. Nails show no clubbing.  Psychiatric: He has a normal mood and affect.      Data Reviewed: Basic Metabolic Panel: Recent Labs  Lab 06/30/18 1000 07/01/18 0433  NA 135 137  K 3.8 4.0  CL 101 106  CO2 25 24  GLUCOSE 152* 93  BUN 15 12  CREATININE 0.88 0.73  CALCIUM 8.8* 8.5*   Liver Function Tests: Recent Labs  Lab 06/30/18 1000  AST 48*  ALT 36  ALKPHOS 81  BILITOT 0.5  PROT 7.8  ALBUMIN 3.3*   CBC: Recent Labs  Lab 06/30/18 1000  07/01/18 0158 07/01/18 0433 07/02/18 0459 07/03/18 0444 07/04/18 0455  WBC 9.9  --   --   --   --   --   --   NEUTROABS 6.8*  --   --   --   --   --   --   HGB 8.4*   < > 8.5* 8.5* 9.0* 9.1* 9.9*  HCT 24.6*  --   --   --   --   --   --   MCV 93.4  --   --   --   --   --   --   PLT 295  --   --   --   --   --   --    < > =  values in this interval not displayed.    CBG: Recent Labs  Lab 07/03/18 1139 07/03/18 1642 07/03/18 1942 07/04/18 0737 07/04/18 1223  GLUCAP 155* 122* 214* 117* 119*    Scheduled Meds: . Influenza vac split quadrivalent PF  0.5 mL Intramuscular Tomorrow-1000  . insulin aspart  0-5 Units Subcutaneous QHS  . insulin aspart  0-9 Units Subcutaneous TID WC  . latanoprost  1 drop Both Eyes QHS  . levothyroxine  50 mcg Oral Q0600  . pantoprazole  40 mg Intravenous Q12H  . simvastatin  20 mg Oral q1800  . warfarin  2 mg Oral ONCE-1800  . Warfarin - Pharmacist Dosing Inpatient   Does not apply q1800   Continuous Infusions:   Assessment/Plan:  1. Duodenal ulcer with visible blood vessel.  Full liquid diet started this morning, gastroenterology advance to to soft diet this evening.  Protonix IV twice daily.  Gastroenterology wrote in their note today can potentially go home tomorrow if no signs of rebleeding. 2. Acute blood loss anemia.  Today's hemoglobin up at 9.9.  Status  post IV Venofer. 3. Metallic valve on Coumadin.  INR still elevated but finally trending better to 3.69.  Coumadin has been held the entire hospital course so far.  Will start a smaller dose this evening of 2 mg.  Patient will likely need a close follow-up on INR check as outpatient and a lower dose to go home with. 4. Hypothyroidism unspecified on levothyroxine 5. Hyperlipidemia unspecified on simvastatin 6. Glaucoma unspecified on latanoprost 7. Type 2 diabetes mellitus.  Sliding scale insulin. 8. Physical therapy evaluation, recommends home health PT with rolling walker  Code Status:     Code Status Orders  (From admission, onward)         Start     Ordered   06/30/18 1422  Full code  Continuous     06/30/18 1421        Code Status History    Date Active Date Inactive Code Status Order ID Comments User Context   09/18/2017 2314 09/21/2017 1702 Full Code [id number]  Lance Coon, MD Inpatient    Advance Directive Documentation     Most Recent Value  Type of Advance Directive  Living will  Pre-existing out of facility DNR order (yellow form or pink MOST form)  -  "MOST" Form in Place?  -     Disposition Plan: Potentially home over the weekend.  Follow INR and hemoglobin tomorrow morning  Consultants:  Gastroenterology  Procedures:  EGD  Time spent: 28 minutes, spoke with wife at the bedside  [employer]

## 2018-07-04 NOTE — Progress Notes (Signed)
Physical Therapy Treatment Patient Details Name: [name] [name] MRN: [id number] DOB: 09/28/1934 Today's Date: 07/04/2018    History of Present Illness presented to ER secondary to generalized weakness, melena; admitted with acute GIB, supratherapeutic INR (admit 7, current 4.8).  Upper endoscopy significnant for duodenal ulcer, injected; no recurrent active bleeding noted per RN.  Of note, recent cardiac valve replacement with PPM (8/20); maintains sternal precautions at this time.    PT Comments    PT led patient through increased ambulation with dynamic gait tasks, which patient is able to complete safely with increased time needed occasionally and proper use of AD (RW). Patient is able to negotiate 3 steps with handrail support with moderate level of confidence and PT SBA. PT led patient through LE strengthening therex, which patient is able to complete with accuracy following min PT cuing. PT will continue to work toward pt goals to ensure confidence and safety with return home.   Follow Up Recommendations  Home health PT     Equipment Recommendations  Rolling walker with 5" wheels    Recommendations for Other Services       Precautions / Restrictions Precautions Precautions: Sternal;Fall;ICD/Pacemaker Restrictions Weight Bearing Restrictions: No    Mobility  Bed Mobility Overal bed mobility: Modified Independent             General bed mobility comments: good adherence to sternal precautions  Transfers Overall transfer level: Modified independent Equipment used: Rolling walker (2 wheeled) Transfers: Sit to/from Stand Sit to Stand: Modified independent (Device/Increase time)         General transfer comment: utilized rocking motion with arms across chest to stand with good balance noted post stand  Ambulation/Gait Ambulation/Gait assistance: Supervision Gait Distance (Feet): 570 Feet Assistive device: Rolling walker (2 wheeled) Gait Pattern/deviations:  WFL(Within Functional Limits)     General Gait Details: Patient is able to maintain balance with no noted gait abnormalities during head turns, change in speed, turns, and was able to stop-and-turn with increased time, safely. Reports 5/10 BORG score following gait activities   Stairs Stairs: Yes Stairs assistance: Supervision Stair Management: One rail Left   General stair comments: Held top of rail (as if holding door frame at home)    Wheelchair Mobility    Modified Rankin (Stroke Patients Only)       Balance Overall balance assessment: Modified Independent Sitting-balance support: No upper extremity supported;Feet supported Sitting balance-Leahy Scale: Good     Standing balance support: No upper extremity supported Standing balance-Leahy Scale: Good Standing balance comment: Patient able to accept mod challenge with perturbation with balance. Patient able to stand with eyes open in wide BOS and narrow BOS for 30sec; with eyes open in semi and full tandem 15sec each direction with stepping strategy to maintain balance following. Patient able to complete standing wide BOS with eyes closed 30sec and with eyes closed narrow BOS 15sec.      Tandem Stance - Right Leg: 15 Tandem Stance - Left Leg: 15 Rhomberg - Eyes Opened: 30 Rhomberg - Eyes Closed: 15                Cognition Arousal/Alertness: Awake/alert Behavior During Therapy: WFL for tasks assessed/performed Overall Cognitive Status: Within Functional Limits for tasks assessed                                        Exercises Other Exercises Other  Exercises: Standing hip abd 2x 10 each LE with "finger tip" support at counter top and min cuing for posture without lateral trunk lean Other Exercises: Standing heel raises 2x 10 with "finger tip" support at counter top Other Exercises: Standing heel raises 2x 10 with "finger tip" support at counter top; with cuing for proper form and full hip ext  with standing phase with good carry over following    General Comments        Pertinent Vitals/Pain Pain Assessment: No/denies pain    Home Living                      Prior Function            PT Goals (current goals can now be found in the care plan section) Acute Rehab PT Goals Patient Stated Goal: to return home PT Goal Formulation: With patient/family Time For Goal Achievement: 07/16/18 Potential to Achieve Goals: Good Progress towards PT goals: Progressing toward goals    Frequency    Min 2X/week      PT Plan Current plan remains appropriate    Co-evaluation              AM-PAC PT "6 Clicks" Daily Activity  Outcome Measure  Difficulty turning over in bed (including adjusting bedclothes, sheets and blankets)?: None Difficulty moving from lying on back to sitting on the side of the bed? : None Difficulty sitting down on and standing up from a chair with arms (e.g., wheelchair, bedside commode, etc,.)?: A Little Help needed moving to and from a bed to chair (including a wheelchair)?: None Help needed walking in hospital room?: A Little Help needed climbing 3-5 steps with a railing? : A Little 6 Click Score: 21    End of Session Equipment Utilized During Treatment: Gait belt Activity Tolerance: Patient tolerated treatment well Patient left: in bed;with call bell/phone within reach;with bed alarm set;with family/visitor present   PT Visit Diagnosis: Difficulty in walking, not elsewhere classified (R26.2)     Time: 4599-7741 PT Time Calculation (min) (ACUTE ONLY): 29 min  Charges:  $Gait Training: 8-22 mins $Therapeutic Exercise: 8-22 mins                     Shelton Silvas PT, DPT   Shelton Silvas 07/04/2018, 4:15 PM

## 2018-07-04 NOTE — Progress Notes (Signed)
  [name], MD [facility]   [address], [place], [place] [address] Phone: [phone] Fax : [phone]   Subjective: The patient has had no further sign of any GI bleeding and states his stools are now brown.  The patient INR has come down to 3.69.  The patient has been tolerating clear liquids.  He denies any abdominal pain or any nausea.   Objective: Vital signs in last 24 hours: Vitals:   07/03/18 2120 07/03/18 2249 07/04/18 0507 07/04/18 1100  BP: (!) 125/52  (!) 124/56 (!) 131/59  Pulse: 79 83 79 80  Resp: (!) 24 20 20 13   Temp: 98.4 F (36.9 C)  98.3 F (36.8 C) 98.4 F (36.9 C)  TempSrc: Oral  Axillary Oral  SpO2: 95% 94% 95% 96%  Weight:      Height:       Weight change:   Intake/Output Summary (Last 24 hours) at 07/04/2018 1318 Last data filed at 07/04/2018 1224 Gross per 24 hour  Intake 1984.15 ml  Output 1600 ml  Net 384.15 ml     Exam: Heart:: Regular rate and rhythm, S1S2 present or without murmur or extra heart sounds Lungs: normal and clear to auscultation and percussion Abdomen: soft, nontender, normal bowel sounds   Lab Results: @LABTEST2 @ Micro Results: No results found for this or any previous visit (from the past 240 hour(s)). Studies/Results: No results found. Medications: I have reviewed the patient's current medications. Scheduled Meds: . Influenza vac split quadrivalent PF  0.5 mL Intramuscular Tomorrow-1000  . insulin aspart  0-5 Units Subcutaneous QHS  . insulin aspart  0-9 Units Subcutaneous TID WC  . latanoprost  1 drop Both Eyes QHS  . levothyroxine  50 mcg Oral Q0600  . pantoprazole  40 mg Intravenous Q12H  . simvastatin  20 mg Oral q1800  . warfarin  2 mg Oral ONCE-1800  . Warfarin - Pharmacist Dosing Inpatient   Does not apply q1800   Continuous Infusions: PRN Meds:.acetaminophen **OR** acetaminophen, albuterol, bisacodyl, HYDROcodone-acetaminophen, ondansetron **OR** ondansetron (ZOFRAN) IV,  senna-docusate   Assessment: Active Problems:   GIB (gastrointestinal bleeding)   Acute upper GI bleed   Symptomatic anemia   Blood in stool   Bleeding duodenal ulcer    Plan: This patient will have his diet advanced to a soft diet and if he tolerates the diet and keep his hemoglobin stable then he can be discharged tomorrow from a GI point of view with a double dose PPI.  The patient should then follow-up as an outpatient.   LOS: 4 days   Kyan Yurkovich 07/04/2018, 1:18 PM

## 2018-07-04 NOTE — Consult Note (Signed)
[place] for warfarin Indication: mechanical heart valve  Allergies  Allergen Reactions  . Macrolides And Ketolides Other (See Comments)  . Mycinettes   . Nitrofuran Derivatives Other (See Comments)  . Nitrofurantoin Other (See Comments)  . Erythromycin Itching and Rash    Other reaction(s): UNKNOWN  And red skin    Patient Measurements: Height: 5\' 9"  (175.3 cm) Weight: 180 lb (81.6 kg) IBW/kg (Calculated) : 70.7  Vital Signs: Temp: 98.3 F (36.8 C) (09/20 0507) Temp Source: Axillary (09/20 0507) BP: 124/56 (09/20 0507) Pulse Rate: 79 (09/20 0507)  Labs: Recent Labs    07/02/18 0459 07/03/18 0444 07/04/18 0455  HGB 9.0* 9.1* 9.9*  LABPROT 44.6* 42.9* 36.3*  INR 4.80* 4.56* 3.69    Estimated Creatinine Clearance: 71.2 mL/min (by C-G formula based on SCr of 0.73 mg/dL).   Medical History: Past Medical History:  Diagnosis Date  . Atherosclerosis of aorta ([place])    by CT scan in past  . Atrial fibrillation ([place])    and aflutter. pt has a left atrial circuit that is not ablated. was on amiodarone-stopped, now use rate control.   . Bladder cancer ([place])    hx; treated with BCG in past   . Carotid artery disease ([place])    There was calcified plaque but no stenosis by carotid artery screening done at [facility] October, 2013  . Diabetes mellitus    type not specified  . Diabetic neuropathy (HCC)    feet  . Elevated liver enzymes    over time; hx  . Excessive sweating   . Glaucoma   . Gout   . Head injury    when slipped on ice 2004-2005. stabilized and back on Coumadin   . Headache    migraines - distant past  . HOCM (hypertrophic obstructive cardiomyopathy) ([place])    a. 01/2016 Echo: EF 65-70%, no rwma, LVOT gradient of 80-63mmHg, mod AS, SAM, sev dil LA, PASP 24mmHg; b. 11/2017 Echo: EF 55-60%, near cavity obliteration in systole, mod AS, mild MS, mod to sev dil LA, nl RV fxn, PASP 58mmHg.  [name] Homocystinemia  ([place])    elevated, mild   . Hypercholesterolemia    treated.   . Hypertension   . Kidney mass    laproscopic surgery woth cryoablation of a mass outside kidney followed at [facility].   . Moderate aortic stenosis    a. 01/2016 echo: mod AS.   [name] Motion sickness    ocean boats  . Nausea   . Obstructive sleep apnea    CPAP started successfully 2014  . Orthostatic hypotension    a. orthostatic. dehydration. hospitalized 11/11  . Sleep apnea    Significant obstructive sleep apnea diagnosed in August, 2012, the patient is to receive CPAP   . Stroke [facility])    "2 old strokes" CT and MRI. [facility] 11/11. diagnosis was dehydration, no acutal reports.   . TSH elevation    on synthroid historically   . Unsteady gait    August, 2012    Medications:  Scheduled:  . Influenza vac split quadrivalent PF  0.5 mL Intramuscular Tomorrow-1000  . insulin aspart  0-5 Units Subcutaneous QHS  . insulin aspart  0-9 Units Subcutaneous TID WC  . latanoprost  1 drop Both Eyes QHS  . levothyroxine  50 mcg Oral Q0600  . pantoprazole  40 mg Intravenous Q12H  . simvastatin  20 mg Oral q1800    Assessment: Patient is a  84 year old male on warfarin PTA for a mechanical heart valve. However pt was admitted for elevated INR (7.2 outpatient) and black stools. His home dose of warfarin is 2.5 mg daily except 5mg  F/Su. Pt just had valve surgery (myectomy/mechanical AVR/MRV 8/20 at [facility]. Per PA at [facility], no vit K. Pt was transfused in ER. INR is again supratherapeutic today but is now closer to therapeutic range. Dr Allen Norris notes no active bleeding with stable Hgb. He received 300 mg iron sucrose on 9/18.   I spoke with the patient and his wife this morning regarding his previous doses and responses. He has been on a variety of doses with the most recent dose regimen of 2.5 mg daily except 5mg  F/Su. However, it does not seem that he was on that regimen long enough to assess his response because the majority of the  response leading up to this result was a 4mg  daily regimen.    INR  Dose 9/16  5.30  Hold 9/17  5.37  Hold 9/18  4.80  Hold 9/19  4.56  Hold 9/20  3.69  2 mg  Goal of Therapy:  2.5-3.5 Monitor platelets by anticoagulation protocol: Yes   Plan:  Because the patient has not had a dose since Sunday and INR is nearing the therapeutic window he needs a small dose of warfarin today. Continue to monitor INR and CBC.   Vallery Sa, PharmD Clinical Pharmacist 07/04/2018,7:41 AM

## 2018-07-05 LAB — HEMOGLOBIN: Hemoglobin: 9.7 g/dL — ABNORMAL LOW (ref 13.0–18.0)

## 2018-07-05 LAB — IRON AND TIBC
IRON: 40 ug/dL — AB (ref 45–182)
SATURATION RATIOS: 16 % — AB (ref 17.9–39.5)
TIBC: 244 ug/dL — AB (ref 250–450)
UIBC: 204 ug/dL

## 2018-07-05 LAB — FOLATE: Folate: 9.2 ng/mL (ref >5.9)

## 2018-07-05 LAB — PROTIME-INR
INR: 2.9
PROTHROMBIN TIME: 30.1 s — AB (ref 11.4–15.2)

## 2018-07-05 LAB — FERRITIN: FERRITIN: 809 ng/mL — AB (ref 24–336)

## 2018-07-05 LAB — GLUCOSE, CAPILLARY: Glucose-Capillary: 124 mg/dL — ABNORMAL HIGH (ref 70–99)

## 2018-07-05 LAB — VITAMIN B12: Vitamin B-12: 857 pg/mL (ref 180–914)

## 2018-07-05 MED ORDER — WARFARIN SODIUM 2.5 MG PO TABS
2.5000 mg | ORAL_TABLET | Freq: Once | ORAL | Status: DC
  Filled 2018-07-05: qty 1

## 2018-07-05 MED ORDER — PANTOPRAZOLE SODIUM 40 MG PO TBEC: 40.0000 mg | DELAYED_RELEASE_TABLET | Freq: Two times a day (BID) | ORAL | 1 refills | Status: DC

## 2018-07-05 MED ORDER — WARFARIN SODIUM 3 MG PO TABS: 3.0000 mg | ORAL_TABLET | Freq: Every day | ORAL | 0 refills | Status: DC

## 2018-07-05 NOTE — Consult Note (Signed)
[place] for warfarin Indication: mechanical heart valve  Allergies  Allergen Reactions  . Macrolides And Ketolides Other (See Comments)  . Mycinettes   . Nitrofuran Derivatives Other (See Comments)  . Nitrofurantoin Other (See Comments)  . Erythromycin Itching and Rash    Other reaction(s): UNKNOWN  And red skin    Patient Measurements: Height: 5\' 9"  (175.3 cm) Weight: 180 lb (81.6 kg) IBW/kg (Calculated) : 70.7  Vital Signs: Temp: 97.4 F (36.3 C) (09/21 0514) Temp Source: Axillary (09/21 0514) BP: 128/58 (09/21 0514) Pulse Rate: 80 (09/21 0514)  Labs: Recent Labs    07/03/18 0444 07/04/18 0455 07/05/18 0558  HGB 9.1* 9.9* 9.7*  LABPROT 42.9* 36.3* 30.1*  INR 4.56* 3.69 2.90    Estimated Creatinine Clearance: 71.2 mL/min (by C-G formula based on SCr of 0.73 mg/dL).   Medical History: Past Medical History:  Diagnosis Date  . Atherosclerosis of aorta ([place])    by CT scan in past  . Atrial fibrillation ([place])    and aflutter. pt has a left atrial circuit that is not ablated. was on amiodarone-stopped, now use rate control.   . Bladder cancer ([place])    hx; treated with BCG in past   . Carotid artery disease ([place])    There was calcified plaque but no stenosis by carotid artery screening done at [facility] October, 2013  . Diabetes mellitus    type not specified  . Diabetic neuropathy (HCC)    feet  . Elevated liver enzymes    over time; hx  . Excessive sweating   . Glaucoma   . Gout   . Head injury    when slipped on ice 2004-2005. stabilized and back on Coumadin   . Headache    migraines - distant past  . HOCM (hypertrophic obstructive cardiomyopathy) ([place])    a. 01/2016 Echo: EF 65-70%, no rwma, LVOT gradient of 80-25mmHg, mod AS, SAM, sev dil LA, PASP 31mmHg; b. 11/2017 Echo: EF 55-60%, near cavity obliteration in systole, mod AS, mild MS, mod to sev dil LA, nl RV fxn, PASP 57mmHg.  [name] Homocystinemia  ([place])    elevated, mild   . Hypercholesterolemia    treated.   . Hypertension   . Kidney mass    laproscopic surgery woth cryoablation of a mass outside kidney followed at [facility].   . Moderate aortic stenosis    a. 01/2016 echo: mod AS.   [name] Motion sickness    ocean boats  . Nausea   . Obstructive sleep apnea    CPAP started successfully 2014  . Orthostatic hypotension    a. orthostatic. dehydration. hospitalized 11/11  . Sleep apnea    Significant obstructive sleep apnea diagnosed in August, 2012, the patient is to receive CPAP   . Stroke [facility])    "2 old strokes" CT and MRI. [facility] 11/11. diagnosis was dehydration, no acutal reports.   . TSH elevation    on synthroid historically   . Unsteady gait    August, 2012    Medications:  Scheduled:  . Influenza vac split quadrivalent PF  0.5 mL Intramuscular Tomorrow-1000  . insulin aspart  0-5 Units Subcutaneous QHS  . insulin aspart  0-9 Units Subcutaneous TID WC  . latanoprost  1 drop Both Eyes QHS  . levothyroxine  50 mcg Oral Q0600  . pantoprazole  40 mg Intravenous Q12H  . simvastatin  20 mg Oral q1800  . Warfarin - Pharmacist Dosing Inpatient  Does not apply q1800    Assessment: Patient is a 84 year old male on warfarin PTA for a mechanical heart valve. However pt was admitted for elevated INR (7.2 outpatient) and black stools. His home dose of warfarin is 2.5 mg daily except 5mg  F/Su. Pt just had valve surgery (myectomy/mechanical AVR/MRV 8/20 at [facility]. Per PA at [facility], no vit K. Pt was transfused in ER. INR is again supratherapeutic today but is now closer to therapeutic range. Dr Allen Norris notes no active bleeding with stable Hgb. He received 300 mg iron sucrose on 9/18.   I spoke with the patient and his wife this morning regarding his previous doses and responses. He has been on a variety of doses with the most recent dose regimen of 2.5 mg daily except 5mg  F/Su. However, it does not seem that he was on that regimen long  enough to assess his response because the majority of the response leading up to this result was a 4mg  daily regimen.    INR  Dose 9/16  5.30  Hold 9/17  5.37  Hold 9/18  4.80  Hold 9/19  4.56  Hold 9/20  3.69  2 mg 9/21  2.90    Goal of Therapy:  2.5-3.5 Monitor platelets by anticoagulation protocol: Yes   Plan:  INR therapeutic. Will order Warfarin 2.5 mg x 1.  Continue to monitor INR and CBC.   Chinita Greenland PharmD Clinical Pharmacist 07/05/2018

## 2018-07-05 NOTE — Discharge Summary (Signed)
[facility] NAME: [name] [name]    MR#:  [id number]  DATE OF BIRTH:  24-Sep-1934  DATE OF ADMISSION:  06/30/2018 ADMITTING PHYSICIAN: Demetrios Loll, MD  DATE OF DISCHARGE: 07/05/2018 12:27 PM  PRIMARY CARE PHYSICIAN: [name], MD   ADMISSION DIAGNOSIS:  Acute upper GI bleed [K92.2] Supratherapeutic INR [R79.1] Mechanical heart valve present [Z95.2] Symptomatic anemia [D64.9]  DISCHARGE DIAGNOSIS:  Active Problems:   GIB (gastrointestinal bleeding)   Acute upper GI bleed   Symptomatic anemia   Blood in stool   Bleeding duodenal ulcer   SECONDARY DIAGNOSIS:   Past Medical History:  Diagnosis Date  . Atherosclerosis of aorta ([place])    by CT scan in past  . Atrial fibrillation ([place])    and aflutter. pt has a left atrial circuit that is not ablated. was on amiodarone-stopped, now use rate control.   . Bladder cancer ([place])    hx; treated with BCG in past   . Carotid artery disease ([place])    There was calcified plaque but no stenosis by carotid artery screening done at [facility] October, 2013  . Diabetes mellitus    type not specified  . Diabetic neuropathy (HCC)    feet  . Elevated liver enzymes    over time; hx  . Excessive sweating   . Glaucoma   . Gout   . Head injury    when slipped on ice 2004-2005. stabilized and back on Coumadin   . Headache    migraines - distant past  . HOCM (hypertrophic obstructive cardiomyopathy) ([place])    a. 01/2016 Echo: EF 65-70%, no rwma, LVOT gradient of 80-53mmHg, mod AS, SAM, sev dil LA, PASP 81mmHg; b. 11/2017 Echo: EF 55-60%, near cavity obliteration in systole, mod AS, mild MS, mod to sev dil LA, nl RV fxn, PASP 64mmHg.  [name] Homocystinemia ([place])    elevated, mild   . Hypercholesterolemia    treated.   . Hypertension   . Kidney mass    laproscopic surgery woth cryoablation of a mass outside kidney followed at [facility].   . Moderate aortic stenosis    a.  01/2016 echo: mod AS.   [name] Motion sickness    ocean boats  . Nausea   . Obstructive sleep apnea    CPAP started successfully 2014  . Orthostatic hypotension    a. orthostatic. dehydration. hospitalized 11/11  . Sleep apnea    Significant obstructive sleep apnea diagnosed in August, 2012, the patient is to receive CPAP   . Stroke [facility])    "2 old strokes" CT and MRI. [facility] 11/11. diagnosis was dehydration, no acutal reports.   . TSH elevation    on synthroid historically   . Unsteady gait    August, 2012     ADMITTING HISTORY [name] [name]  is a 84 y.o. male with a known history of multiple medical problems including A. Fib, HOCM, atherosclerosis of aorta, bladder cancer, carotid arterial disease, hypertension, diabetes, hyperlipidemia, sleep apnea and stroke etc.  The patient had recent open heart surgery with 2 mechanical heart valve placement and a pacemaker placement on August 20.  He has been on Coumadin for more than 20 years due to A. fib.  He never has a history of GI bleeding.  He has had a large amount melena for the past 2 days.  He also complains of a generalized weakness.  He was found elevated INR more than  5, hemoglobin decreased to 8.4 from 11.8.  He denies any other symptoms.  Dr. Joni Fears contact with the [place] cardiovascular surgeon, who suggested hold Coumadin for now but need to continue Coumadin at therapeutic range for mechanical vales.  HOSPITAL COURSE:  Patient was admitted to medical floor.. Coumadin was held.  Gastroenterology consultation was done during the hospitalization.  Patient received IV Protonix and IV fluids during the stay in the hospital.  Aspirin was held.  Patient was worked up with endoscopy which showed duodenal ulcer with a visible vessel that was treated with hemo spray.  Patient is gastrointestinal bleeding resolved.  He had supratherapeutic INR at the time of admission.  INR was followed every day and it has come down to 2.9 at the time  of discharge.  Coumadin was restarted at a lower dose.  Patient to follow-up with gastroenterology in the clinic for further recommendations and [facility] for further recommendations.  Patient will be discharged home on oral proton pump inhibitor twice a day and continue his INR levels to be checked at Coumadin clinic.  Patient received IV Venofer during the stay in the hospital and tolerated it well.  CONSULTS OBTAINED:  Treatment Team:  [name], MD  DRUG ALLERGIES:   Allergies  Allergen Reactions  . Macrolides And Ketolides Other (See Comments)  . Mycinettes   . Nitrofuran Derivatives Other (See Comments)  . Nitrofurantoin Other (See Comments)  . Erythromycin Itching and Rash    Other reaction(s): UNKNOWN  And red skin    DISCHARGE MEDICATIONS:   Allergies as of 07/05/2018      Reactions   Macrolides And Ketolides Other (See Comments)   Mycinettes    Nitrofuran Derivatives Other (See Comments)   Nitrofurantoin Other (See Comments)   Erythromycin Itching, Rash   Other reaction(s): UNKNOWN  And red skin      Medication List    STOP taking these medications   albuterol 108 (90 Base) MCG/ACT inhaler Commonly known as:  PROVENTIL HFA;VENTOLIN HFA   aspirin 81 MG chewable tablet   digoxin 0.125 MG tablet Commonly known as:  LANOXIN     TAKE these medications   acetaminophen 325 MG tablet Commonly known as:  TYLENOL Take 650 mg by mouth every 6 (six) hours as needed.   glucose blood test strip USE ONE STRIP TO CHECK GLUCOSE ONCE DAILY   ONE TOUCH ULTRA TEST test strip Generic drug:  glucose blood USE TO TEST BLOOD SUGAR ONCE DAILY   latanoprost 0.005 % ophthalmic solution Commonly known as:  XALATAN Place 1 drop into both eyes at bedtime.   levothyroxine 50 MCG tablet Commonly known as:  SYNTHROID, LEVOTHROID TAKE ONE TABLET BY MOUTH ONCE DAILY   metFORMIN 1000 MG tablet Commonly known as:  GLUCOPHAGE Take 1,000 mg by mouth 2 (two)  times daily.   ONETOUCH DELICA LANCETS 16X Misc USE ONE LANCET TO CHECK BLOOD GLUCOSE ONCE DAILY   ONETOUCH DELICA LANCETS 09U Misc USE TO TEST BLOOD SUGAR ONCE DAILY   pantoprazole 40 MG tablet Commonly known as:  PROTONIX Take 1 tablet (40 mg total) by mouth 2 (two) times daily.   simvastatin 40 MG tablet Commonly known as:  ZOCOR TAKE 1/2 (ONE-HALF) TABLET BY MOUTH AT BEDTIME   warfarin 3 MG tablet Commonly known as:  COUMADIN Take as directed. If you are unsure how to take this medication, talk to your nurse or doctor. Original instructions:  Take 1 tablet (3 mg total) by mouth daily  at 6 PM. What changed:    medication strength  how much to take  how to take this  when to take this  additional instructions       Today  Patient seen and evaluated today No complaints of any vomiting or blood No blood in the stool or black stools Tolerating diet well  VITAL SIGNS:  Blood pressure (!) 128/58, pulse 80, temperature (!) 97.4 F (36.3 C), temperature source Axillary, resp. rate 20, height 5\' 9"  (1.753 m), weight 81.6 kg, SpO2 97 %.  I/O:    Intake/Output Summary (Last 24 hours) at 07/05/2018 1350 Last data filed at 07/05/2018 0848 Gross per 24 hour  Intake 360 ml  Output 760 ml  Net -400 ml    PHYSICAL EXAMINATION:  Physical Exam  GENERAL:  84 y.o.-year-old patient lying in the bed with no acute distress.  LUNGS: Normal breath sounds bilaterally, no wheezing, rales,rhonchi or crepitation. No use of accessory muscles of respiration.  CARDIOVASCULAR: S1, S2 normal. No murmurs, rubs, or gallops.  ABDOMEN: Soft, non-tender, non-distended. Bowel sounds present. No organomegaly or mass.  NEUROLOGIC: Moves all 4 extremities. PSYCHIATRIC: The patient is alert and oriented x 3.  SKIN: No obvious rash, lesion, or ulcer.   DATA REVIEW:   CBC Recent Labs  Lab 06/30/18 1000  07/05/18 0558  WBC 9.9  --   --   HGB 8.4*   < > 9.7*  HCT 24.6*  --   --   PLT 295   --   --    < > = values in this interval not displayed.    Chemistries  Recent Labs  Lab 06/30/18 1000 07/01/18 0433  NA 135 137  K 3.8 4.0  CL 101 106  CO2 25 24  GLUCOSE 152* 93  BUN 15 12  CREATININE 0.88 0.73  CALCIUM 8.8* 8.5*  AST 48*  --   ALT 36  --   ALKPHOS 81  --   BILITOT 0.5  --     Cardiac Enzymes No results for input(s): TROPONINI in the last 168 hours.  Microbiology Results  Results for orders placed or performed during the hospital encounter of 09/18/17  Blood Culture (routine x 2)     Status: None   Collection Time: 09/18/17  8:49 PM  Result Value Ref Range Status   Specimen Description BLOOD BLOOD RIGHT FOREARM  Final   Special Requests   Final    BOTTLES DRAWN AEROBIC AND ANAEROBIC Blood Culture adequate volume   Culture NO GROWTH 5 DAYS  Final   Report Status 09/23/2017 FINAL  Final  Blood Culture (routine x 2)     Status: None   Collection Time: 09/18/17  8:49 PM  Result Value Ref Range Status   Specimen Description BLOOD LEFT ANTECUBITAL  Final   Special Requests   Final    BOTTLES DRAWN AEROBIC AND ANAEROBIC Blood Culture adequate volume   Culture NO GROWTH 5 DAYS  Final   Report Status 09/23/2017 FINAL  Final    RADIOLOGY:  No results found.  Follow up with PCP in 1 week.  Management plans discussed with the patient, family and they are in agreement.  CODE STATUS: Full code    Code Status Orders  (From admission, onward)         Start     Ordered   06/30/18 1422  Full code  Continuous     06/30/18 1421        Code Status  History    Date Active Date Inactive Code Status Order ID Comments User Context   09/18/2017 2314 09/21/2017 1702 Full Code [id number]  Lance Coon, MD Inpatient    Advance Directive Documentation     Most Recent Value  Type of Advance Directive  Living will  Pre-existing out of facility DNR order (yellow form or pink MOST form)  -  "MOST" Form in Place?  -      TOTAL TIME TAKING CARE OF THIS  PATIENT ON DAY OF DISCHARGE: more than 34 minutes.   Saundra Shelling M.D on 07/05/2018 at 1:50 PM  Between 7am to 6pm - Pager - [phone]  After 6pm go to [email or web address] - password [email or web address] [facility]  Office  [phone]  CC: Primary care physician; [name], MD  Note: This dictation was prepared with Dragon dictation along with smaller phrase technology. Any transcriptional errors that result from this process are unintentional.

## 2018-07-05 NOTE — Progress Notes (Signed)
Discharge teaching given to patient, patient verbalized understanding and had no questions. Patient IV's removed. Patient will be transported home by family. All patient belongings gathered prior to leaving.  

## 2018-07-07 ENCOUNTER — Other Ambulatory Visit: Payer: Self-pay

## 2018-07-07 ENCOUNTER — Telehealth: Payer: Self-pay

## 2018-07-07 ENCOUNTER — Telehealth: Payer: Self-pay | Admitting: Cardiovascular Disease

## 2018-07-07 NOTE — Telephone Encounter (Signed)
Spoke w/ pt's wife.  Pt was d/c'd from [facility] after GI bleed on 07/05/18. His last INR at that time was 2.9. Pt's range is 2.5-3.5.  Since he is in range, will recheck next week. Asked her to call back w/ any questions or concerns before that time.

## 2018-07-07 NOTE — Telephone Encounter (Signed)
New message:      If Home Health RN is calling please get Coumadin Nurse on the phone STAT  1.  Are you calling in regards to an appointment? No  2.  Are you calling for a refill ? No  3.  Are you having bleeding issues? No  4.  Do you need clearance to hold Coumadin? No  Pt's wife is calling to see when the pt needs to come and get his INR checked again.   Please route to the Coumadin Clinic Pool

## 2018-07-07 NOTE — Telephone Encounter (Signed)
Transition Care Management Follow-up Telephone Call  Date of discharge and from where: [facility] on 07/05/18.  How have you been since you were released from the hospital? Doing better, still weak. Declines blood in the stool or abnormal stools. Declines pain, fever, n/v/d.   Any questions or concerns? Wife is concerned with coumadin level.   Items Reviewed:  Did the pt receive and understand the discharge instructions provided? Yes   Medications obtained and verified? Yes   Any new allergies since your discharge? No   Dietary orders reviewed? Yes  Do you have support at home? Yes   Other (ie: DME, Home Health, etc) N/A  Functional Questionnaire: (I = Independent and D = Dependent)  Bathing/Dressing- I   Meal Prep- I  Eating- I  Maintaining continence- I  Transferring/Ambulation- Use a rollator for walking.  Managing Meds- I   Follow up appointments reviewed:    PCP Hospital f/u appt confirmed? Yes  Scheduled to see Dr Rosanna Randy on 07/10/18 @ 10:40 AM.  [facility] f/u appt confirmed? Yes    Are transportation arrangements needed? No   If their condition worsens, is the pt aware to call  their PCP or go to the ED? Yes  Was the patient provided with contact information for the PCP's office or ED? Yes  Was the pt encouraged to call back with questions or concerns? Yes

## 2018-07-07 NOTE — Patient Outreach (Signed)
[facility]) Care Management PCP will complete TOC post hospital discharge  07/07/2018  JAMAREON SHIMEL [birth year] [phone]   Care Coordination: Successful telephone encounter to the above patient's wife, Suliman Termini, to follow up on patient post discharge on Friday 07/04/18 from [facility] for acute GI bleed. Wife states patient is doing well. All follow-up appointments scheduled including appointment with PCP, Coumadin clinic, and GI. Wife states patient has all medications prescribed at discharge and taking as intended. Denies recent or new falls. Patient continues to ambulate with Rolator. Had 2 "normal" stools today.   Initial home visit scheduled for tomorrow, Tuesday 07/08/18 at 11:30 am.  Plan: Follow up with patient as scheduled  Dawnisha Marquina E. Rollene Rotunda RN, BSN [facility] Care Management Coordinator [phone]

## 2018-07-08 ENCOUNTER — Telehealth: Payer: Self-pay | Admitting: Cardiovascular Disease

## 2018-07-08 ENCOUNTER — Other Ambulatory Visit: Payer: Self-pay

## 2018-07-08 NOTE — Patient Outreach (Signed)
[facility]) Care Management  TOC to be completed by PCP 07/08/2018  [name] [name] 1933-11-06 [id number]  [name] [name] is an 84 y.o. male  Subjective: "I feel good". "I am getting stronger every day". "My health is very important to me so I am going to eat right, exercise, and hopefully get back to being active".   Objective:   BP (!) 138/56 (BP Location: Right Arm, Patient Position: Sitting, Cuff Size: Normal)   Pulse 82   Resp 16   SpO2 97%   Physical Exam  Constitutional: He is oriented to person, place, and time. He appears well-developed and well-nourished.  Cardiovascular: Normal rate, regular rhythm and intact distal pulses.  Mechanical valve click noted  Respiratory: Effort normal and breath sounds normal. No respiratory distress.  GI: Soft. Bowel sounds are normal.  Musculoskeletal: Normal range of motion. He exhibits no edema.  Patient's upper body ROM is limited by sternal precautions  Neurological: He is alert and oriented to person, place, and time.  Skin: Skin is warm and dry.  Sternal incision approximated, D/I. Mediastinal CT sites D/I with large scab noted to left site.  Psychiatric: He has a normal mood and affect. His behavior is normal. Judgment and thought content normal.    Encounter Medications:   Outpatient Encounter Medications as of 07/08/2018  Medication Sig  . acetaminophen (TYLENOL) 325 MG tablet Take 650 mg by mouth every 6 (six) hours as needed.  [name] glucose blood (ONE TOUCH ULTRA TEST) test strip USE ONE STRIP TO CHECK GLUCOSE ONCE DAILY  . latanoprost (XALATAN) 0.005 % ophthalmic solution Place 1 drop into both eyes at bedtime.   [name] levothyroxine (SYNTHROID, LEVOTHROID) 50 MCG tablet TAKE ONE TABLET BY MOUTH ONCE DAILY  . metFORMIN (GLUCOPHAGE) 1000 MG tablet Take 1,000 mg by mouth 2 (two) times daily.  . ONE TOUCH ULTRA TEST test strip USE TO TEST BLOOD SUGAR ONCE DAILY  . ONETOUCH DELICA LANCETS 01S MISC USE ONE LANCET TO  CHECK BLOOD GLUCOSE ONCE DAILY  . ONETOUCH DELICA LANCETS 01U MISC USE TO TEST BLOOD SUGAR ONCE DAILY  . pantoprazole (PROTONIX) 40 MG tablet Take 1 tablet (40 mg total) by mouth 2 (two) times daily.  . simvastatin (ZOCOR) 40 MG tablet TAKE 1/2 (ONE-HALF) TABLET BY MOUTH AT BEDTIME  . warfarin (COUMADIN) 3 MG tablet Take 1 tablet (3 mg total) by mouth daily at 6 PM.   No facility-administered encounter medications on file as of 07/08/2018.     Functional Status:   In your present state of health, do you have any difficulty performing the following activities: 07/08/2018 06/30/2018  Hearing? Y Y  Comment - -  Vision? N N  Difficulty concentrating or making decisions? N N  Walking or climbing stairs? N N  Dressing or bathing? N N  Doing errands, shopping? N N  Preparing Food and eating ? N -  Using the Toilet? N -  In the past six months, have you accidently leaked urine? N -  Do you have problems with loss of bowel control? N -  Managing your Medications? N -  Managing your Finances? N -  Housekeeping or managing your Housekeeping? N -  Some recent data might be hidden    Fall/Depression Screening:    Fall Risk  07/08/2018 11/11/2017 10/30/2016  Falls in the past year? No Yes No  Number falls in past yr: - 1 -  Injury with Fall? - No -  Comment - bruising -  Risk Factor Category  - - -  Follow up - Falls prevention discussed -   PHQ 2/9 Scores 07/08/2018 11/11/2017 11/11/2017 10/30/2016 06/13/2015  PHQ - 2 Score 0 0 0 0 0  PHQ- 9 Score - 0 - - -    Assessment:  Just prior to visit, RN CM received call from [place] requesting information about [facility]. [facility] had attempted to establish care with patient this morning however wife informed agency that "a nurse is already coming out". RN CM discussed Community CM services with [name] [name]. [facility] re-contacted wife while RN CM was in home. HH RN will complete initial visit this afternoon and HH PT will begin tomorrow. RN CM clarified  role for [facility] CM with wife and patient.  Patient is alert and oriented. Well groomed, sitting in recliner upon RN CM arrival. Patient and wife engaged during visit. Wife is very organized with calendar. Has all follow-up appointments with time written on appropriate dates.   Wife and patient deny needs for additional community resources. They are well connected with their medical providers and home health. Wife did seek consult from RN CM regarding written request for Cardiac Rehab given to her by [facility]. RN CM suggested wife call Dr. Tyrell Antonio office, request Dr. Fletcher Anon place referral for cardiac rehab to [facility] per written request in order to expedite referral process. RN CM also suggested patient take written request to cardiology appointment on Thursday. They deny needs for [place] or social work. RN CM commended patient and wife for their engagement with patients healthcare team and healthcare needs. Discussed with patient weekly telephonic follow-up's for a couple of weeks to assure [facility] services began and PCP follow-up appointments were successful.   Plan: Follow up phone call in 1 week  [facility] CM Care Plan Problem One     Most Recent Value  Care Plan Problem One  High risk for hospital readmission secondary to recent hospitalization/SNF stay for MVR/AVR with myectomy, sternal plating, pacemaker placement, with secondary complication of GI bleed related to coumadin therapy  Role Documenting the Problem One  Care Management Coordinator  Care Plan for Problem One  Active  [facility] Long Term Goal   Over the next 31 days patient will not experience hospital readmission as evidenced by patient reporting and EMR review  [facility] Long Term Goal Start Date  07/08/18  Interventions for Problem One Long Term Goal  discussed recent hospitalizations including elective surgery and readmit for GI bleed. Confirmed that patient is aware of foods that will cause increased risk of bleeding ulcer. Discussed sternal  precautions and HH services including PT. Confirmed follow up appointments with PCP, Cardiology, and GI  [facility] CM Short Term Goal #1   Over the next 30 days, patient will take all medications as prescribed, as evidenced by patient reporting and ongoing review of EMR  [facility] CM Short Term Goal #1 Start Date  07/08/18  Interventions for Short Term Goal #1  completed medication reconciliation and visualization of medication. Counseled patient and wife on medication adherance and importance of follow-up with coumadin clinic  [facility] CM Short Term Goal #2   Over the next 30 days patient will participate/attend all outpatient and in-home appointments as evidenced by patient reporting and review of EMR  Interventions for Short Term Goal #2  Discussed importance of all follow-up appointments and confirmed appointments with wife patient. Reviewed appointments in EMR. Ruled out any transportation barriers to appointments     Calyx Hawker E. Rollene Rotunda, RN, BSN  [facility] Care Management Coordinator [phone]

## 2018-07-08 NOTE — Telephone Encounter (Signed)
Refer to cardiac rehab at [facility].

## 2018-07-08 NOTE — Telephone Encounter (Signed)
New Message:     Patient wife call to state he just had Open Heart surgery and is being referred for Cardiac Rehab.   Patient wife has questions and would like a return call.

## 2018-07-08 NOTE — Telephone Encounter (Signed)
SPOKE TO [facility] 07/04/18. RECEIVED  A LETTER  REQUESTING  PATIENT TO CONSIDER  CARDIAC REHAB AT [facility].   WIFE STATES SHE NEEDS AN ORDER FROM DR ARIDA. PATIENT HAS FOLLOW UP APPOINTMENT  WITH [facility] IN OCT  2019.  WIFE AWARE WILL DEFER TO DR Fletcher Anon

## 2018-07-09 ENCOUNTER — Telehealth: Payer: Self-pay | Admitting: Family Medicine

## 2018-07-09 NOTE — Telephone Encounter (Signed)
ok 

## 2018-07-09 NOTE — Telephone Encounter (Signed)
Needing verbal order ok to resume home health PT for: 2 times a week for 5 weeks  It's ok to leave a voice message.  Please advise.  Thanks, [employer]

## 2018-07-09 NOTE — Telephone Encounter (Signed)
[id number] 

## 2018-07-09 NOTE — Telephone Encounter (Signed)
Patient's wife made aware that a rehab referral has been ordered for her husband at [facility].

## 2018-07-09 NOTE — Telephone Encounter (Signed)
Ok to order 

## 2018-07-10 ENCOUNTER — Ambulatory Visit (INDEPENDENT_AMBULATORY_CARE_PROVIDER_SITE_OTHER): Payer: PPO | Admitting: Family Medicine

## 2018-07-10 ENCOUNTER — Encounter: Payer: Self-pay | Admitting: Family Medicine

## 2018-07-10 VITALS — BP 120/58 | HR 80 | Temp 98.1°F | Resp 15 | Wt 177.0 lb

## 2018-07-10 DIAGNOSIS — E119 Type 2 diabetes mellitus without complications: Secondary | ICD-10-CM | POA: Diagnosis not present

## 2018-07-10 DIAGNOSIS — R197 Diarrhea, unspecified: Secondary | ICD-10-CM | POA: Diagnosis not present

## 2018-07-10 DIAGNOSIS — I482 Chronic atrial fibrillation, unspecified: Secondary | ICD-10-CM

## 2018-07-10 DIAGNOSIS — K922 Gastrointestinal hemorrhage, unspecified: Secondary | ICD-10-CM | POA: Diagnosis not present

## 2018-07-10 DIAGNOSIS — D509 Iron deficiency anemia, unspecified: Secondary | ICD-10-CM | POA: Diagnosis not present

## 2018-07-10 NOTE — Progress Notes (Signed)
Patient: [name] [name] Male    DOB: 12-24-1933   84 y.o.   MRN: [id number] Visit Date: 07/10/2018  Today's Provider: [name], MD   Chief Complaint  Patient presents with  . Hospitalization Follow-up   Subjective:    HPI    Follow up ER visit/Transistion of Care from surgery/GI bleed Patient was seen in ER for Melena and Abnormal lab( supratherapeutic INR) on 06/30/18 and was admitted, patient was discharged on 07/05/18 He was treated for:    GIB (gastrointestinal bleeding)    Acute upper GI bleed     Symptomatic anemia     Blood in stool    Bleeding duodenal ulcer   . Treatment for this included Holding coumadin and Aspirin, IV treatment of Protonix and fluids. At discharge coumadin was restarted at a lower dose, and advised to follow up with [facility] and [facility]. Patient was discharged home on oral proton pump inhibitor twice a day, and advised to restart coumadin at 3mg  daily at [facility]. Patient has appt with [facility] scheduled next month 08/26/18 and on 09/19/18 has appt scheduled with [facility]. Patient appt with G.I scheduled for 07/21/18.  He reports excellent compliance with treatment. He reports this condition is improving. Wife states that she still has concerns because patient have been runny and mucous like.   ------------------------------------------------------------------------------------    Allergies  Allergen Reactions  . Macrolides And Ketolides Other (See Comments)  . Mycinettes   . Nitrofuran Derivatives Other (See Comments)  . Nitrofurantoin Other (See Comments)  . Erythromycin Itching and Rash    Other reaction(s): UNKNOWN  And red skin     Current Outpatient Medications:  .  glucose blood (ONE TOUCH ULTRA TEST) test strip, USE ONE STRIP TO CHECK GLUCOSE ONCE DAILY, Disp: 100 each, Rfl: 12 .  latanoprost (XALATAN) 0.005 % ophthalmic solution, Place 1 drop into both eyes at bedtime. , Disp: , Rfl:  .   levothyroxine (SYNTHROID, LEVOTHROID) 50 MCG tablet, TAKE ONE TABLET BY MOUTH ONCE DAILY, Disp: 90 tablet, Rfl: 3 .  metFORMIN (GLUCOPHAGE) 1000 MG tablet, Take 1,000 mg by mouth 2 (two) times daily., Disp: , Rfl:  .  ONE TOUCH ULTRA TEST test strip, USE TO TEST BLOOD SUGAR ONCE DAILY, Disp: 50 each, Rfl: 25 .  ONETOUCH DELICA LANCETS 90W MISC, USE ONE LANCET TO CHECK BLOOD GLUCOSE ONCE DAILY, Disp: 100 each, Rfl: 12 .  ONETOUCH DELICA LANCETS 40X MISC, USE TO TEST BLOOD SUGAR ONCE DAILY, Disp: 100 each, Rfl: 12 .  pantoprazole (PROTONIX) 40 MG tablet, Take 1 tablet (40 mg total) by mouth 2 (two) times daily., Disp: 60 tablet, Rfl: 1 .  simvastatin (ZOCOR) 40 MG tablet, TAKE 1/2 (ONE-HALF) TABLET BY MOUTH AT BEDTIME, Disp: 45 tablet, Rfl: 3 .  warfarin (COUMADIN) 3 MG tablet, Take 1 tablet (3 mg total) by mouth daily at 6 PM., Disp: 30 tablet, Rfl: 0 .  acetaminophen (TYLENOL) 325 MG tablet, Take 650 mg by mouth every 6 (six) hours as needed., Disp: , Rfl:   Review of Systems  Constitutional: Negative.   HENT: Negative.   Eyes: Negative.   Respiratory: Negative.   Cardiovascular: Negative for chest pain, palpitations and leg swelling.  Gastrointestinal: Negative for abdominal distention, abdominal pain, anal bleeding, blood in stool, constipation, diarrhea, nausea, rectal pain and vomiting.  Endocrine: Negative.   Genitourinary: Negative.   Musculoskeletal: Negative.  Negative for arthralgias, back pain, gait problem and  joint swelling.  Allergic/Immunologic: Negative.   Neurological: Positive for dizziness and weakness. Negative for light-headedness and headaches.  Hematological: Negative.   Psychiatric/Behavioral: Negative.     Social History   Tobacco Use  . Smoking status: Former Smoker    Types: Cigars, Cigarettes    Last attempt to quit: 10/02/1974    Years since quitting: 43.8  . Smokeless tobacco: Never Used  . Tobacco comment: still smokes cigars once a day  Substance Use  Topics  . Alcohol use: No    Frequency: Never   Objective:   BP (!) 120/58   Pulse 80   Temp 98.1 F (36.7 C) (Oral)   Resp 15   Wt 177 lb (80.3 kg)   SpO2 96%   BMI 26.14 kg/m  Vitals:   07/10/18 1051  BP: (!) 120/58  Pulse: 80  Resp: 15  Temp: 98.1 F (36.7 C)  TempSrc: Oral  SpO2: 96%  Weight: 177 lb (80.3 kg)     Physical Exam  Constitutional: He is oriented to person, place, and time. He appears well-developed and well-nourished.  HENT:  Head: Normocephalic and atraumatic.  Right Ear: External ear normal.  Left Ear: External ear normal.  Nose: Nose normal.  Mouth/Throat: No oropharyngeal exudate.  Eyes: Conjunctivae are normal. No scleral icterus.  Neck: No thyromegaly present.  Cardiovascular: Normal rate, regular rhythm and normal heart sounds.  Pulmonary/Chest: Effort normal and breath sounds normal.  Abdominal: Soft.  Musculoskeletal: He exhibits no edema.  Neurological: He is alert and oriented to person, place, and time.  Skin: Skin is warm and dry.  Psychiatric: He has a normal mood and affect. His behavior is normal. Judgment and thought content normal.        Assessment & Plan:     1. Diarrhea, unspecified type  - CBC with Differential/Platelet  2. Iron deficiency anemia, unspecified iron deficiency anemia type  - Comprehensive metabolic panel - CBC with Differential/Platelet - TSH  3. Gastrointestinal hemorrhage, unspecified gastrointestinal hemorrhage type  - Comprehensive metabolic panel  4. Chronic atrial fibrillation  - INR/PT  5. Type 2 diabetes mellitus without complication, without long-term current use of insulin (HCC)  - HgB A1c      I have done the exam and reviewed the above chart and it is accurate to the best of my knowledge. Development worker, community has been used in this note in any air is in the dictation or transcription are unintentional.  [name], MD  [place]

## 2018-07-10 NOTE — Telephone Encounter (Signed)
Advised Aldrin as below.

## 2018-07-11 ENCOUNTER — Telehealth: Payer: Self-pay | Admitting: Family Medicine

## 2018-07-11 LAB — PROTIME-INR
INR: 2.8 — ABNORMAL HIGH (ref 0.8–1.2)
PROTHROMBIN TIME: 26.6 s — AB (ref 9.1–12.0)

## 2018-07-11 LAB — COMPREHENSIVE METABOLIC PANEL
A/G RATIO: 1 — AB (ref 1.2–2.2)
ALT: 9 IU/L (ref 0–44)
AST: 22 IU/L (ref 0–40)
Albumin: 3.6 g/dL (ref 3.5–4.7)
Alkaline Phosphatase: 85 IU/L (ref 39–117)
BUN/Creatinine Ratio: 10 (ref 10–24)
BUN: 8 mg/dL (ref 8–27)
Bilirubin Total: 0.3 mg/dL (ref 0.0–1.2)
CALCIUM: 9.2 mg/dL (ref 8.6–10.2)
CO2: 21 mmol/L (ref 20–29)
CREATININE: 0.8 mg/dL (ref 0.76–1.27)
Chloride: 102 mmol/L (ref 96–106)
GFR calc non Af Amer: 83 mL/min/{1.73_m2} (ref >59)
GFR, EST AFRICAN AMERICAN: 96 mL/min/{1.73_m2} (ref >59)
GLUCOSE: 120 mg/dL — AB (ref 65–99)
Globulin, Total: 3.6 g/dL (ref 1.5–4.5)
POTASSIUM: 3.8 mmol/L (ref 3.5–5.2)
Sodium: 139 mmol/L (ref 134–144)
TOTAL PROTEIN: 7.2 g/dL (ref 6.0–8.5)

## 2018-07-11 LAB — CBC WITH DIFFERENTIAL/PLATELET
BASOS ABS: 0.1 10*3/uL (ref 0.0–0.2)
BASOS: 1 %
EOS (ABSOLUTE): 0.5 10*3/uL — ABNORMAL HIGH (ref 0.0–0.4)
Eos: 6 %
Hematocrit: 28.3 % — ABNORMAL LOW (ref 37.5–51.0)
Hemoglobin: 9.2 g/dL — ABNORMAL LOW (ref 13.0–17.7)
IMMATURE GRANS (ABS): 0 10*3/uL (ref 0.0–0.1)
IMMATURE GRANULOCYTES: 0 %
LYMPHS: 24 %
Lymphocytes Absolute: 1.9 10*3/uL (ref 0.7–3.1)
MCH: 30.4 pg (ref 26.6–33.0)
MCHC: 32.5 g/dL (ref 31.5–35.7)
MCV: 93 fL (ref 79–97)
MONOS ABS: 0.6 10*3/uL (ref 0.1–0.9)
Monocytes: 8 %
NEUTROS PCT: 61 %
Neutrophils Absolute: 4.5 10*3/uL (ref 1.4–7.0)
PLATELETS: 279 10*3/uL (ref 150–450)
RBC: 3.03 x10E6/uL — AB (ref 4.14–5.80)
RDW: 15.1 % (ref 12.3–15.4)
WBC: 7.6 10*3/uL (ref 3.4–10.8)

## 2018-07-11 LAB — TSH: TSH: 2.96 u[IU]/mL (ref 0.450–4.500)

## 2018-07-11 LAB — HEMOGLOBIN A1C
Est. average glucose Bld gHb Est-mCnc: 111 mg/dL
HEMOGLOBIN A1C: 5.5 % (ref 4.8–5.6)

## 2018-07-11 NOTE — Telephone Encounter (Signed)
Pt needing lab results.  Please advise.  Thanks, [employer]

## 2018-07-12 ENCOUNTER — Telehealth: Payer: Self-pay | Admitting: Family Medicine

## 2018-07-12 NOTE — Telephone Encounter (Signed)
[name] [name] pt's wife calling requesting to get the results from pt's blood work, [name] [name] states she was concern of what it is because, pt having yellowish stool. [name] [name] states if you call house phone and don't get answer then to please call her cell phone @ [phone] .Thanks CC

## 2018-07-12 NOTE — Telephone Encounter (Signed)
Patient is having yellow stools and diarrhea.  His wife is concerned it may be from his Metformin.  [facility] had increased it from 500 mg 1 daily to 2 daily.  Patient had A1C done 2 days ago and it was 5.5.  Wife has been instructed to decrease the Metformin to 1 daily until Monday and then call back and check if you want to continue 1 or have him do anything different.  She has also been advised that you had not signed of on the labs but his INR was good at 2.8 and his other labs were stable with the exception of the Hgb still being low.  She was advised to cautious and notify us of any bleeding.  She is to call Monday for further instructions from you.

## 2018-07-14 ENCOUNTER — Telehealth: Payer: Self-pay | Admitting: Family Medicine

## 2018-07-14 ENCOUNTER — Ambulatory Visit: Payer: PPO

## 2018-07-14 DIAGNOSIS — I482 Chronic atrial fibrillation, unspecified: Secondary | ICD-10-CM

## 2018-07-14 DIAGNOSIS — Z5181 Encounter for therapeutic drug level monitoring: Secondary | ICD-10-CM

## 2018-07-14 DIAGNOSIS — Z950 Presence of cardiac pacemaker: Secondary | ICD-10-CM

## 2018-07-14 DIAGNOSIS — Z952 Presence of prosthetic heart valve: Secondary | ICD-10-CM

## 2018-07-14 DIAGNOSIS — I4892 Unspecified atrial flutter: Secondary | ICD-10-CM | POA: Diagnosis not present

## 2018-07-14 DIAGNOSIS — I4891 Unspecified atrial fibrillation: Secondary | ICD-10-CM

## 2018-07-14 LAB — POCT INR: INR: 3.2 — AB (ref 2.0–3.0)

## 2018-07-14 NOTE — Telephone Encounter (Signed)
Per Dr Rosanna Randy the patient is to stay on the Metformin at 1 daily.  If loose stools continue he is to stop the Metformin altogether until his next A1C is checked.   Patient advised

## 2018-07-14 NOTE — Telephone Encounter (Signed)
ok 

## 2018-07-14 NOTE — Telephone Encounter (Signed)
Verbal order given to Maudie Mercury with [facility]

## 2018-07-14 NOTE — Telephone Encounter (Signed)
Please review. Thanks!  

## 2018-07-14 NOTE — Patient Instructions (Signed)
Please continue new dosage from the hospital of 3 mg every day. Recheck in 2 weeks.

## 2018-07-14 NOTE — Telephone Encounter (Signed)
Kim with [facility] called needing a verbal on skilled nursing services for 60 days  CB# is [phone]  Thanks  Con Memos

## 2018-07-16 ENCOUNTER — Other Ambulatory Visit: Payer: Self-pay

## 2018-07-16 NOTE — Patient Outreach (Signed)
[facility]) Care Management  07/16/2018  [name] [name] [birth year] [id number]  Telephone Assessment/Care Coordination: Successful telephone encounter to [name] [name], the above patients wife and HCPOA for a 2 week follow up post initial home visit to assess progress towards care plan short term goals. Patient is unavailable at this time related to appointment with [name] RN.  Wife states patient continues to do well with recovery from recent surgery and GI bleed. Continues to complete all ADLs independently using sternal precautions. Denies additional bleeding. Recent visit to coumadin clinic confirmed theraputic INR of 3.0. Completed post hospital follow up with PCP on 9/26. Wife states patients A1C was 5.5. Patient has been having loose stools recently and wife concerned it may be related to patients increase in metformin by [facility] providers. PCP decreased dose of metformin and loose stools have resolved.  Patient continues to receive [name] services through [name]. PT continues 2 x week until November 4th. Wife is unsure when nursing services will end. Referral to [facility] has been placed however patient has not yet been contacted.  Wife acknowledges follow up appointments with GI on 10/7 and Cardiology on 10/15.   Wife denies needs for additional community services at this time.  Plan: Follow up phone call next week to address loose stools and routine home visit scheduled for  10/24 at 11:00  [facility] CM Care Plan Problem One     Most Recent Value  Care Plan Problem One  High risk for hospital readmission secondary to recent hospitalization/SNF stay for MVR/AVR with myectomy, sternal plating, pacemaker placement, with secondary complication of GI bleed related to coumadin therapy  Role Documenting the Problem One  Care Management Coordinator  Care Plan for Problem One  Active  [facility] Long Term Goal   Over the next 31 days patient will not experience hospital  readmission as evidenced by patient reporting and EMR review  [facility] Long Term Goal Start Date  07/08/18  Interventions for Problem One Long Term Goal  reviewed upcoming and past appointments with patients wife, confirmed [facility] services in place, discussed medication changes  [facility] CM Short Term Goal #1   Over the next 30 days, patient will take all medications as prescribed, as evidenced by patient reporting and ongoing review of EMR  [facility] CM Short Term Goal #1 Start Date  07/08/18  Interventions for Short Term Goal #1  discussed medication changes  [facility] CM Short Term Goal #2   Over the next 30 days patient will participate/attend all outpatient and in-home appointments as evidenced by patient reporting and review of EMR  [name] CM Short Term Goal #2 Start Date  07/08/18  Interventions for Short Term Goal #2  reviewed appointments in EMR with wife     Broox Lonigro E. Rollene Rotunda RN, BSN [name] Care Management Coordinator [phone]

## 2018-07-21 ENCOUNTER — Ambulatory Visit: Payer: PPO | Admitting: Gastroenterology

## 2018-07-21 ENCOUNTER — Other Ambulatory Visit
Admission: RE | Admit: 2018-07-21 | Discharge: 2018-07-21 | Disposition: A | Discharge status: Home / Self Care | Payer: PPO | Source: Ambulatory Visit | Attending: Gastroenterology | Admitting: Gastroenterology

## 2018-07-21 ENCOUNTER — Telehealth: Payer: Self-pay

## 2018-07-21 ENCOUNTER — Encounter: Payer: Self-pay | Admitting: Gastroenterology

## 2018-07-21 ENCOUNTER — Other Ambulatory Visit: Payer: Self-pay

## 2018-07-21 VITALS — BP 158/61 | HR 102 | Ht 69.0 in | Wt 180.0 lb

## 2018-07-21 DIAGNOSIS — K264 Chronic or unspecified duodenal ulcer with hemorrhage: Secondary | ICD-10-CM | POA: Diagnosis not present

## 2018-07-21 DIAGNOSIS — R300 Dysuria: Secondary | ICD-10-CM

## 2018-07-21 DIAGNOSIS — N390 Urinary tract infection, site not specified: Secondary | ICD-10-CM

## 2018-07-21 LAB — CBC WITH DIFFERENTIAL/PLATELET
BASOS ABS: 0.1 10*3/uL (ref 0–0.1)
BASOS PCT: 1 %
Eosinophils Absolute: 0.5 10*3/uL (ref 0–0.7)
Eosinophils Relative: 7 %
HEMATOCRIT: 32.2 % — AB (ref 40.0–52.0)
HEMOGLOBIN: 10.6 g/dL — AB (ref 13.0–18.0)
Lymphocytes Relative: 27 %
Lymphs Abs: 2.1 10*3/uL (ref 1.0–3.6)
MCH: 31.4 pg (ref 26.0–34.0)
MCHC: 33 g/dL (ref 32.0–36.0)
MCV: 95.2 fL (ref 80.0–100.0)
Monocytes Absolute: 0.9 10*3/uL (ref 0.2–1.0)
Monocytes Relative: 11 %
NEUTROS ABS: 4.3 10*3/uL (ref 1.4–6.5)
Neutrophils Relative %: 54 %
Platelets: 263 10*3/uL (ref 150–440)
RBC: 3.38 MIL/uL — ABNORMAL LOW (ref 4.40–5.90)
RDW: 16.7 % — ABNORMAL HIGH (ref 11.5–14.5)
WBC: 7.9 10*3/uL (ref 3.8–10.6)

## 2018-07-21 LAB — URINALYSIS, ROUTINE W REFLEX MICROSCOPIC
Bilirubin Urine: NEGATIVE
Glucose, UA: NEGATIVE mg/dL
Ketones, ur: NEGATIVE mg/dL
NITRITE: NEGATIVE
PH: 6.5 (ref 5.0–8.0)
Protein, ur: 30 mg/dL — AB
Specific Gravity, Urine: 1.015 (ref 1.005–1.030)

## 2018-07-21 LAB — URINALYSIS, MICROSCOPIC (REFLEX): SQUAMOUS EPITHELIAL / LPF: NONE SEEN (ref 0–5)

## 2018-07-21 NOTE — Telephone Encounter (Signed)
Patient was seen today at Dr. Dorothey Baseman office and was noted to have a UTI. Urine was collected and cultured. However, patient needs to be treated for this. They are requesting that something be called in for the patient.   I advised patient's wife that you are out of the office, and we will send something first thing in the morning. [place] Thanks!

## 2018-07-21 NOTE — Progress Notes (Signed)
Primary Care Physician: [name], MD  Primary Gastroenterologist:  Dr. [name]  Chief Complaint  Patient presents with  . Follow-up    HPI: [name] [name] is a 84 y.o. male here for follow-up after being discharged from the hospital.  The patient was seen in the hospital for melena and had a large duodenal ulcer seen.  The patient was treated with hemo-spray and the bleeding stopped.  The patient had lab work done by his primary care provider that showed his hemoglobin to be low but stable. The patient's last INR was 2.8.  He denies any black stools or bloody stools.  The patient has not taken any supplemental iron and states it is because of the fear that he may mistaken for bleeding. The patient reports that he has had foul-smelling urine recently and is worried that he may have a urinary tract infection.  Current Outpatient Medications  Medication Sig Dispense Refill  . acetaminophen (TYLENOL) 325 MG tablet Take 650 mg by mouth every 6 (six) hours as needed.    [name] glucose blood (ONE TOUCH ULTRA TEST) test strip USE ONE STRIP TO CHECK GLUCOSE ONCE DAILY 100 each 12  . latanoprost (XALATAN) 0.005 % ophthalmic solution Place 1 drop into both eyes at bedtime.     [name] levothyroxine (SYNTHROID, LEVOTHROID) 50 MCG tablet TAKE ONE TABLET BY MOUTH ONCE DAILY 90 tablet 3  . metFORMIN (GLUCOPHAGE) 1000 MG tablet Take 1,000 mg by mouth daily with breakfast.     . ONE TOUCH ULTRA TEST test strip USE TO TEST BLOOD SUGAR ONCE DAILY 50 each 25  . ONETOUCH DELICA LANCETS 28J MISC USE ONE LANCET TO CHECK BLOOD GLUCOSE ONCE DAILY 100 each 12  . ONETOUCH DELICA LANCETS 68T MISC USE TO TEST BLOOD SUGAR ONCE DAILY 100 each 12  . pantoprazole (PROTONIX) 40 MG tablet Take 1 tablet (40 mg total) by mouth 2 (two) times daily. 60 tablet 1  . simvastatin (ZOCOR) 40 MG tablet TAKE 1/2 (ONE-HALF) TABLET BY MOUTH AT BEDTIME 45 tablet 3  . warfarin (COUMADIN) 3 MG tablet Take 1 tablet (3 mg  total) by mouth daily at 6 PM. 30 tablet 0   No current facility-administered medications for this visit.     Allergies as of 07/21/2018 - Review Complete 07/10/2018  Allergen Reaction Noted  . Macrolides and ketolides Other (See Comments) 06/13/2015  . Mycinettes  06/15/2011  . Nitrofuran derivatives Other (See Comments) 05/21/2014  . Nitrofurantoin Other (See Comments) 06/13/2015  . Erythromycin Itching and Rash 05/21/2014    ROS:  General: Negative for anorexia, weight loss, fever, chills, fatigue, weakness. ENT: Negative for hoarseness, difficulty swallowing , nasal congestion. CV: Negative for chest pain, angina, palpitations, dyspnea on exertion, peripheral edema.  Respiratory: Negative for dyspnea at rest, dyspnea on exertion, cough, sputum, wheezing.  GI: See history of present illness. GU:  Negative for dysuria, hematuria, urinary incontinence, urinary frequency, nocturnal urination.  Endo: Negative for unusual weight change.    Physical Examination:   BP (!) 158/61   Pulse (!) 102   Ht 5\' 9"  (1.753 m)   Wt 180 lb (81.6 kg)   BMI 26.58 kg/m   General: Well-nourished, well-developed in no acute distress.  Eyes: No icterus. Conjunctivae pink. Mouth: Oropharyngeal mucosa moist and pink , no lesions erythema or exudate. Lungs: Clear to auscultation bilaterally. Non-labored. Heart: Regular rate and rhythm, no murmurs rubs or gallops.  Abdomen: Bowel sounds are normal, nontender, nondistended, no  hepatosplenomegaly or masses, no abdominal bruits or hernia , no rebound or guarding.   Extremities: No lower extremity edema. No clubbing or deformities. Neuro: Alert and oriented x 3.  Grossly intact. Skin: Warm and dry, no jaundice.   Psych: Alert and cooperative, normal mood and affect.  Labs:    Imaging Studies: No results found.  Assessment and Plan:   [name] [name] is a 84 y.o. y/o male with a duodenal ulcer and visible vessel with a drop in his hemoglobin.   The patient has not had any further signs of bleeding and states that his stools were at first yellow and now are brown.  The patient will start to take an over-the-counter iron supplementation and has been told to watch for black diarrhea notches black stools.  The patient has also been sent for a CBC to make sure his blood count is improving.  Due to the patient also reporting a foul smell of his urine he is also be sent off for a urinalysis.  He has been told the results of this will be sent to his primary care provider for treatment if positive.  The patient and his wife have been explained the plan and agree with it.    [name], MD. [facility]   Note: This dictation was prepared with Dragon dictation along with smaller phrase technology. Any transcriptional errors that result from this process are unintentional.

## 2018-07-21 NOTE — Progress Notes (Signed)
ur

## 2018-07-22 ENCOUNTER — Telehealth: Payer: Self-pay

## 2018-07-22 MED ORDER — SULFAMETHOXAZOLE-TRIMETHOPRIM 400-80 MG PO TABS: 1.0000 | ORAL_TABLET | Freq: Two times a day (BID) | ORAL | 0 refills | Status: AC

## 2018-07-22 NOTE — Telephone Encounter (Signed)
Pt's wife notified of lab results. 

## 2018-07-22 NOTE — Telephone Encounter (Signed)
Medication was sent into the pharmacy.  

## 2018-07-22 NOTE — Telephone Encounter (Signed)
Septra BID for 5 days.

## 2018-07-22 NOTE — Telephone Encounter (Signed)
-----   Message from [name], MD sent at 07/22/2018  6:18 AM EDT ----- Let the patient know that his blood count is better and his PCP should be contacting him about his UTI.

## 2018-07-23 ENCOUNTER — Ambulatory Visit: Payer: PPO

## 2018-07-23 DIAGNOSIS — Z950 Presence of cardiac pacemaker: Secondary | ICD-10-CM | POA: Diagnosis not present

## 2018-07-23 DIAGNOSIS — I4892 Unspecified atrial flutter: Secondary | ICD-10-CM | POA: Diagnosis not present

## 2018-07-23 DIAGNOSIS — I482 Chronic atrial fibrillation, unspecified: Secondary | ICD-10-CM | POA: Diagnosis not present

## 2018-07-23 DIAGNOSIS — Z5181 Encounter for therapeutic drug level monitoring: Secondary | ICD-10-CM

## 2018-07-23 DIAGNOSIS — I4891 Unspecified atrial fibrillation: Secondary | ICD-10-CM | POA: Diagnosis not present

## 2018-07-23 DIAGNOSIS — Z952 Presence of prosthetic heart valve: Secondary | ICD-10-CM

## 2018-07-23 LAB — POCT INR: INR: 2.6 (ref 2.0–3.0)

## 2018-07-23 NOTE — Patient Instructions (Signed)
Please continue dosage of 3 mg every day. Recheck in 1 week.

## 2018-07-24 LAB — URINE CULTURE: Culture: >=100000 — AB

## 2018-07-28 ENCOUNTER — Telehealth: Payer: Self-pay | Admitting: Family Medicine

## 2018-07-28 ENCOUNTER — Ambulatory Visit: Payer: PPO

## 2018-07-28 DIAGNOSIS — Z952 Presence of prosthetic heart valve: Secondary | ICD-10-CM | POA: Diagnosis not present

## 2018-07-28 DIAGNOSIS — I482 Chronic atrial fibrillation, unspecified: Secondary | ICD-10-CM

## 2018-07-28 DIAGNOSIS — Z5181 Encounter for therapeutic drug level monitoring: Secondary | ICD-10-CM | POA: Diagnosis not present

## 2018-07-28 DIAGNOSIS — Z950 Presence of cardiac pacemaker: Secondary | ICD-10-CM

## 2018-07-28 DIAGNOSIS — I4891 Unspecified atrial fibrillation: Secondary | ICD-10-CM | POA: Diagnosis not present

## 2018-07-28 DIAGNOSIS — I4892 Unspecified atrial flutter: Secondary | ICD-10-CM

## 2018-07-28 LAB — POCT INR: INR: 3.1 — AB (ref 2.0–3.0)

## 2018-07-28 NOTE — Telephone Encounter (Signed)
Order given to check urine and send for culture if UA is positive.

## 2018-07-28 NOTE — Telephone Encounter (Signed)
Kim with [employer] [facility] called saying the patient has finished a round of antibiotics for a UTI and she thinks he still may have an UTI.  She wants to know if she can have an order to get a UA  Call back is [phone]  Thanks Con Memos

## 2018-07-28 NOTE — Patient Instructions (Signed)
Please continue dosage of 3 mg every day. Recheck in 3 weeks.

## 2018-07-29 ENCOUNTER — Encounter: Payer: Self-pay | Admitting: Nurse Practitioner

## 2018-07-29 ENCOUNTER — Ambulatory Visit: Payer: PPO | Admitting: Nurse Practitioner

## 2018-07-29 VITALS — BP 120/60 | HR 94 | Ht 69.0 in | Wt 174.8 lb

## 2018-07-29 DIAGNOSIS — I350 Nonrheumatic aortic (valve) stenosis: Secondary | ICD-10-CM | POA: Diagnosis not present

## 2018-07-29 DIAGNOSIS — N390 Urinary tract infection, site not specified: Secondary | ICD-10-CM | POA: Diagnosis not present

## 2018-07-29 DIAGNOSIS — I10 Essential (primary) hypertension: Secondary | ICD-10-CM

## 2018-07-29 DIAGNOSIS — I493 Ventricular premature depolarization: Secondary | ICD-10-CM | POA: Diagnosis not present

## 2018-07-29 DIAGNOSIS — I421 Obstructive hypertrophic cardiomyopathy: Secondary | ICD-10-CM

## 2018-07-29 DIAGNOSIS — I442 Atrioventricular block, complete: Secondary | ICD-10-CM | POA: Diagnosis not present

## 2018-07-29 DIAGNOSIS — K264 Chronic or unspecified duodenal ulcer with hemorrhage: Secondary | ICD-10-CM | POA: Diagnosis not present

## 2018-07-29 DIAGNOSIS — Z79899 Other long term (current) drug therapy: Secondary | ICD-10-CM | POA: Diagnosis not present

## 2018-07-29 DIAGNOSIS — I2510 Atherosclerotic heart disease of native coronary artery without angina pectoris: Secondary | ICD-10-CM | POA: Diagnosis not present

## 2018-07-29 DIAGNOSIS — I340 Nonrheumatic mitral (valve) insufficiency: Secondary | ICD-10-CM

## 2018-07-29 MED ORDER — METOPROLOL TARTRATE 25 MG PO TABS: 12.5000 mg | ORAL_TABLET | Freq: Two times a day (BID) | ORAL | 2 refills | Status: DC

## 2018-07-29 NOTE — Progress Notes (Signed)
Office Visit    Patient Name: [name] [name] Date of Encounter: 07/29/2018  Primary Care Provider:  [name], MD Primary Cardiologist:  Kathlyn Sacramento, MD  Chief Complaint    84 year old male with a history of chronic atrial fibrillation, severe aortic stenosis, severe mitral regurgitation, hypertrophic obstructive cardiomyopathy, hypertension, hyperlipidemia, type 2 diabetes mellitus, sleep apnea, hypothyroidism, and complete heart block status post permanent pacemaker, who presents for follow-up after recent septal myomectomy with mechanical AVR and MVR at [facility].  Past Medical History    Past Medical History:  Diagnosis Date  . Atherosclerosis of aorta ([place])    by CT scan in past  . Atrial fibrillation ([place])    and aflutter. pt has a left atrial circuit that is not ablated. was on amiodarone-stopped, now use rate control.   . Bladder cancer ([place])    hx; treated with BCG in past   . Carotid artery disease ([place])    There was calcified plaque but no stenosis by carotid artery screening done at [facility] October, 2013  . CHB (complete heart block) ([place])    a. 05/2018 s/p BSX VVIR PPM ([facility]) in setting of bradycardia following myomectomy and AVR/MVR.  . Diabetes mellitus    type not specified  . Diabetic neuropathy (HCC)    feet  . Elevated liver enzymes    over time; hx  . Excessive sweating   . Glaucoma   . Gout   . Head injury    when slipped on ice 2004-2005. stabilized and back on Coumadin   . Headache    migraines - distant past  . History of cardiac cath    a. 05/2018 Cath ([facility]): Non-obstructive CAD. Mildly elevated filling pressures w/ nl CI.  [name] HOCM (hypertrophic obstructive cardiomyopathy) ([place])    a. 01/2016 Echo: EF 65-70%, no rwma, LVOT gradient of 80-64mmHg, mod AS, SAM; b. 11/2017 Echo: EF 55-60%, near cavity obliteration in systole, mod AS, mild MS; c. 04/2018 Card MRI ([facility]): Sev LVH, EF >70%, Sev AS, LVOT obs w/ Sev MR, mild  to mod TR/PR, mid-myocardial HE in basal-mid anteroseptum and inferoseptum; d. 05/2018 s/p Septal myomectomy; e. 05/2018 Echo: Nl EF w/ sev LVH. Mech AoV and MV.  . Homocystinemia ([place])    elevated, mild   . Hypercholesterolemia    treated.   . Hypertension   . Kidney mass    laproscopic surgery woth cryoablation of a mass outside kidney followed at [facility].   . Moderate aortic stenosis    a. 01/2016 echo: mod AS.   [name] Motion sickness    ocean boats  . Nausea   . Obstructive sleep apnea    CPAP started successfully 2014  . Orthostatic hypotension    a. orthostatic. dehydration. hospitalized 11/11  . Sleep apnea    Significant obstructive sleep apnea diagnosed in August, 2012, the patient is to receive CPAP   . Stroke [facility])    "2 old strokes" CT and MRI. [place] [facility] 11/11. diagnosis was dehydration, no acutal reports.   . TSH elevation    on synthroid historically   . Unsteady gait    August, 2012   Past Surgical History:  Procedure Laterality Date  . BLADDER SURGERY    . CATARACT EXTRACTION W/PHACO Left 05/11/2015   Procedure: CATARACT EXTRACTION PHACO AND INTRAOCULAR LENS PLACEMENT (IOC);  Surgeon: Leandrew Koyanagi, MD;  Location: [facility];  Service: Ophthalmology;  Laterality: Left;  DIABETIC - oral meds, CPAP  .  ESOPHAGOGASTRODUODENOSCOPY (EGD) WITH PROPOFOL N/A 07/01/2018   Procedure: ESOPHAGOGASTRODUODENOSCOPY (EGD) WITH PROPOFOL;  Surgeon: [name], MD;  Location: [facility] ENDOSCOPY;  Service: Endoscopy;  Laterality: N/A;  . INSERT / REPLACE / REMOVE PACEMAKER     Chemical engineer   . KIDNEY SURGERY     "froze mass"  . MECHANICAL AORTIC AND MITRAL VALVE REPLACEMENT  05/2018   [facility]   . TONSILLECTOMY    . VALVE REPLACEMENT      Allergies  Allergies  Allergen Reactions  . Macrolides And Ketolides Other (See Comments)  . Mycinettes   . Nitrofuran Derivatives Other (See Comments)  . Nitrofurantoin Other (See Comments)  . Erythromycin Itching and Rash     Other reaction(s): UNKNOWN  And red skin    History of Present Illness    84 year old male with the above complex past medical history including chronic atrial fibrillation, aortic stenosis, HOCM, hyperlipidemia, hypertension, type 2 diabetes mellitus, sleep apnea, mitral regurgitation, and hypothyroidism.  He was last seen in clinic here in May, at which time he continued to report dyspnea on exertion and also low blood pressures.  A prior echo showed stability of valvular heart disease and HOCM.  Given ongoing dyspnea, we referred to [facility] where he subsequently underwent cardiac MRI which showed significant LVH with left ventricular outflow tract obstruction along with severe aortic stenosis and severe mitral regurgitation.  He underwent right and left heart cardiac catheterization which showed nonobstructive CAD and normal cardiac index.  He was referred to thoracic surgery and subsequently underwent successful septal myomectomy with mechanical aortic valve replacement and mechanical mitral valve replacement.  Postoperative course was complicated by acute blood loss anemia requiring transfusion as well as pleural effusion requiring thoracentesis, and complete heart block status post placement of a [employer] single lead permanent pacemaker.  Following prolonged hospital course, he was discharged to rehabilitation where he was subsequently discharged from but then developed melena and fatigue and was admitted to [facility] regional in the setting of supratherapeutic INR on September 16.  He saw GI and underwent EGD which showed duodenal ulcer with visible vessel that was treated with hemo-spray.  Coumadin was resumed prior to discharge and follow-up hemoglobin October 7 was stable.  He has had no further melena and has slowly but surely been increasing his activity with overall improvement in exercise tolerance.  He denies chest pain, palpitations, dyspnea, PND, orthopnea, dizziness, syncope, edema, or  early satiety.  Home Medications    Prior to Admission medications   Medication Sig Start Date End Date Taking? Authorizing Provider  acetaminophen (TYLENOL) 325 MG tablet Take 650 mg by mouth every 6 (six) hours as needed. 06/15/18  Yes [provider]  glucose blood (ONE TOUCH ULTRA TEST) test strip USE ONE STRIP TO CHECK GLUCOSE ONCE DAILY 03/25/17  Yes [name], MD  latanoprost (XALATAN) 0.005 % ophthalmic solution Place 1 drop into both eyes at bedtime.  11/13/13  Yes [provider]  levothyroxine (SYNTHROID, LEVOTHROID) 50 MCG tablet TAKE ONE TABLET BY MOUTH ONCE DAILY 06/05/17  Yes [name], MD  metFORMIN (GLUCOPHAGE) 1000 MG tablet Take 1,000 mg by mouth daily with breakfast.  06/15/18 06/15/19 Yes [provider]  ONE TOUCH ULTRA TEST test strip USE TO TEST BLOOD SUGAR ONCE DAILY 05/02/18  Yes [name], MD  [facility] DELICA LANCETS 42A MISC USE ONE LANCET TO CHECK BLOOD GLUCOSE ONCE DAILY 03/25/17  Yes [name], MD  [facility]  DELICA LANCETS 62X MISC USE TO TEST BLOOD SUGAR ONCE DAILY 05/02/18  Yes [name], MD  pantoprazole (PROTONIX) 40 MG tablet Take 1 tablet (40 mg total) by mouth 2 (two) times daily. 07/05/18 08/04/18 Yes Pyreddy, Reatha Harps, MD  simvastatin (ZOCOR) 40 MG tablet TAKE 1/2 (ONE-HALF) TABLET BY MOUTH AT BEDTIME 05/05/18  Yes Wellington Hampshire, MD  warfarin (COUMADIN) 3 MG tablet Take 1 tablet (3 mg total) by mouth daily at 6 PM. 07/05/18 08/04/18 Yes Pyreddy, Reatha Harps, MD    Review of Systems    Recent melena with diagnosis of GI bleed and duodenal ulcer.  No recurrence since hospitalization.  He denies chest pain, palpitations, dyspnea, pnd, orthopnea, n, v, dizziness, syncope, edema, weight gain, or early satiety.  All other systems reviewed and are otherwise negative except as noted above.  Physical Exam    VS:  BP 120/60 (BP Location: Left Arm, Patient Position: Sitting, Cuff Size:  Normal)   Pulse 94   Ht 5\' 9"  (1.753 m)   Wt 174 lb 12 oz (79.3 kg)   BMI 25.81 kg/m  , BMI Body mass index is 25.81 kg/m. GEN: Well nourished, well developed, in no acute distress. HEENT: normal. Neck: Supple, no JVD, no carotid bruits, no masses. Cardiac: RRR, 2/6 systolic murmur at the upper sternal borders, no rubs, or gallops. No clubbing, cyanosis, edema.  Radials/DP/PT 2+ and equal bilaterally.  Left upper chest pacemaker site is well-healed and Steri-Strips were removed today.  Midline sternal incision is well-healed. Respiratory:  Respirations regular and unlabored, clear to auscultation bilaterally. GI: Soft, nontender, nondistended, BS + x 4. MS: no deformity or atrophy. Skin: warm and dry, no rash. Neuro:  Strength and sensation are intact. Psych: Normal affect.  Accessory Clinical Findings    ECG personally reviewed by me today -atrial fibrillation with ventricular bigeminy with a rate of 94- no acute changes.  Device was interrogated in clinic today showing A. fib with frequent PVCs.  Otherwise normal device function.  Lab Results  Component Value Date   INR 3.1 (A) 07/28/2018   INR 2.6 07/23/2018   INR 3.2 (A) 07/14/2018   PROTIME 18.1 03/25/2009    Assessment & Plan    1.  Hypertrophic obstructive cardiomyopathy: Status post recent septal myomectomy at [facility] with the addition of mechanical mitral valve and aortic valve replacements.  Prolonged hospitalization with subsequent recovery and rehab.  He has noted significant improvement in exercise tolerance.  2.  Severe mitral regurgitation/severe aortic stenosis: In the setting of above.  Status post mechanical valve replacements.  He is now on Coumadin chronically and INR is followed in our clinic.  Recent hospitalization setting of GI bleed with supratherapeutic INR.  INR was therapeutic at 3.1 yesterday.  Postoperative echocardiogram showed normal valve functions.  3.  PVCs: Patient with frequent PVCs on ECG  including ventricular bigeminy, along with frequent PVCs noted on device interrogation.  Of note, during recent hospitalization for GI bleed in late September, ECG showed no ectopy.  No report of significant ectopy on discharge summary either.  I am going to add a low-dose beta-blocker today and also follow-up a basic metabolic panel and magnesium.  Postoperative echo showed normal LV function.  Patient is completely asymptomatic.  4.  Complete heart block status post single lead permanent pacemaker: Patient developed complete heart block following myomectomy.  He required placement of a Customer service manager.  In the setting of frequent PVCs today, his device was interrogated  and confirmed frequent PVCs.  He is in permanent atrial fibrillation.  He and his wife are interested in transferring his elective physiology care to our [place] office.  I will set him up to see Dr. Caryl Comes.  5.  Permanent atrial fibrillation: Rate controlled.  Adding back low-dose beta-blocker in setting of frequent PVCs.  He is on Coumadin as above.  6.  Essential hypertension: Stable.  7.  Type 2 diabetes mellitus: On metformin and followed by primary care.  8.  Hyperlipidemia: Continue statin therapy.  LDL was 82 in January.  9.  Duodenal ulcer/GIB:  Recent admission.  Now on PPI.  No recurrent melena.  H/H stable 10/7.  10.  Disposition: Follow-up basic metabolic panel and magnesium today.  Arrange for EP follow-up.  Follow-up in clinic in 1 month or sooner if necessary.  Murray Hodgkins, NP 07/29/2018, 5:59 PM

## 2018-07-29 NOTE — Patient Instructions (Addendum)
Medication Instructions:  Your physician has recommended you make the following change in your medication:  1- START Lopressor 12.5 mg (1/2 tablet) by mouth two times a day.  If you need a refill on your cardiac medications before your next appointment, please call your pharmacy.   Lab work: Your physician recommends that you return for lab work in: [place] [facility] [place].  If you have labs (blood work) drawn today and your tests are completely normal, you will receive your results only by: [name] MyChart Message (if you have MyChart) OR . A paper copy in the mail If you have any lab test that is abnormal or we need to change your treatment, we will call you to review the results.  Testing/Procedures: none  Follow-Up: At [facility], you and your health needs are our priority.  As part of our continuing mission to provide you with exceptional heart care, we have created designated Provider Care Teams.  These Care Teams include your primary Cardiologist (physician) and Advanced Practice Providers (APPs -  Physician Assistants and Nurse Practitioners) who all work together to provide you with the care you need, when you need it. You will need a follow up appointment in 1 months.    You may see Kathlyn Sacramento, MD or one of the following Advanced Practice Providers on your designated Care Team:   Murray Hodgkins, NP Christell Faith, PA-C . Marrianne Mood, PA-C    You have been referred to [facility].

## 2018-07-30 LAB — BASIC METABOLIC PANEL
BUN/Creatinine Ratio: 13 (ref 10–24)
BUN: 13 mg/dL (ref 8–27)
CALCIUM: 9.5 mg/dL (ref 8.6–10.2)
CHLORIDE: 99 mmol/L (ref 96–106)
CO2: 20 mmol/L (ref 20–29)
CREATININE: 1.01 mg/dL (ref 0.76–1.27)
GFR calc Af Amer: 80 mL/min/{1.73_m2} (ref >59)
GFR calc non Af Amer: 69 mL/min/{1.73_m2} (ref >59)
GLUCOSE: 150 mg/dL — AB (ref 65–99)
Potassium: 4.7 mmol/L (ref 3.5–5.2)
SODIUM: 137 mmol/L (ref 134–144)

## 2018-07-30 LAB — MAGNESIUM: MAGNESIUM: 2 mg/dL (ref 1.6–2.3)

## 2018-07-31 ENCOUNTER — Telehealth: Payer: Self-pay | Admitting: *Deleted

## 2018-07-31 ENCOUNTER — Ambulatory Visit: Payer: PPO

## 2018-07-31 DIAGNOSIS — I421 Obstructive hypertrophic cardiomyopathy: Secondary | ICD-10-CM

## 2018-07-31 DIAGNOSIS — I080 Rheumatic disorders of both mitral and aortic valves: Secondary | ICD-10-CM

## 2018-07-31 NOTE — Telephone Encounter (Signed)
-----   Message from Theora Gianotti, NP sent at 07/31/2018  7:45 AM EDT ----- Renal fxn, K, Mg ok.  Does not explain PVC's (extra heart beats noted on ecg).  Can you pls arrange for a f/u echo - establish new baseline post-op and ECG within the next week to re-eval rhythm/pvc's?

## 2018-07-31 NOTE — Telephone Encounter (Signed)
Spoke with wife, ok per DPR. She verbalized understanding of results and plan of care. Transferred to scheduler to arrange for echo and EKG nurse visit.

## 2018-08-04 ENCOUNTER — Other Ambulatory Visit: Payer: Self-pay

## 2018-08-04 ENCOUNTER — Other Ambulatory Visit: Payer: Self-pay | Admitting: Cardiovascular Disease

## 2018-08-04 MED ORDER — WARFARIN SODIUM 3 MG PO TABS: 3.0000 mg | ORAL_TABLET | Freq: Every day | ORAL | 2 refills | Status: DC

## 2018-08-04 NOTE — Telephone Encounter (Signed)
°*  STAT* If patient is at the pharmacy, call can be transferred to refill team.   1. Which medications need to be refilled? (please list name of each medication and dose if known) Coumadin 3 mg po q day   2. Which pharmacy/location (including street and city if local pharmacy) is medication to be sent to?  [facility] [address] [place]   3. Do they need a 30 day or 90 day supply? 90  Out of meds needs by 6 pm today

## 2018-08-04 NOTE — Telephone Encounter (Signed)
Refill sent in this am.

## 2018-08-07 ENCOUNTER — Ambulatory Visit (INDEPENDENT_AMBULATORY_CARE_PROVIDER_SITE_OTHER): Payer: PPO | Admitting: *Deleted

## 2018-08-07 ENCOUNTER — Other Ambulatory Visit: Payer: Self-pay

## 2018-08-07 ENCOUNTER — Ambulatory Visit (INDEPENDENT_AMBULATORY_CARE_PROVIDER_SITE_OTHER): Payer: PPO

## 2018-08-07 VITALS — BP 145/62 | HR 86 | Ht 69.0 in | Wt 180.0 lb

## 2018-08-07 DIAGNOSIS — I421 Obstructive hypertrophic cardiomyopathy: Secondary | ICD-10-CM

## 2018-08-07 DIAGNOSIS — I482 Chronic atrial fibrillation, unspecified: Secondary | ICD-10-CM | POA: Diagnosis not present

## 2018-08-07 DIAGNOSIS — I080 Rheumatic disorders of both mitral and aortic valves: Secondary | ICD-10-CM | POA: Diagnosis not present

## 2018-08-07 NOTE — Progress Notes (Signed)
1.) Reason for visit: EKG check  2.) Name of MD requesting visit: Murray Hodgkins, NP  3.) H&P: Patient with history of HOCM s/p permanent pacemaker, septal myomectomy with mechanical AVR and MVR at [facility].  Was started on Lopressor 12.5 mg two times a day last week at office visit.  4.) ROS related to problem: Patient here today for repeat EKG. No complaints today. Denies chest pain, shortness of breath, dizziness, or swelling.   5.) Assessment and plan per MD: EKG shown to Dr Caryl Comes who reviewed it and patient's record. No further changes today per Dr Caryl Comes.   Patient and wife verbalized understanding and are aware of upcoming appointment dates and times.

## 2018-08-07 NOTE — Patient Outreach (Signed)
[facility]) Care Management   08/07/2018  [name] [name] 05-28-1934 [id number]  [name] [name] is an 84 y.o. male  Subjective: "I am doing real good". "I have had a problem with my heart rhythm but Dr. Jens Som said it was OK this morning" "I don't need any education about diabetes. I keep my sugars well controlled and I had education when I was first told I had diabetes".  Objective:   BP (!) 116/52 (BP Location: Left Arm, Patient Position: Sitting, Cuff Size: Normal)   Pulse 76   Resp 20   SpO2 96%   Physical Exam  Constitutional: He is oriented to person, place, and time. He appears well-developed and well-nourished.  Cardiovascular: Normal rate and intact distal pulses.  Regularly irregular rhythm. Trace edema in lower legs and ankles  Respiratory: Effort normal and breath sounds normal.  GI: Soft. Bowel sounds are normal.  Genitourinary:  Genitourinary Comments: Patient states he is voiding clear yellow urine  Musculoskeletal: Normal range of motion.  Neurological: He is alert and oriented to person, place, and time.  Skin: Skin is warm and dry.  Mid sternal incision well approximated and healed. Left chest PPM incision site healed.  Psychiatric: He has a normal mood and affect. His behavior is normal. Judgment and thought content normal.    Encounter Medications:   Outpatient Encounter Medications as of 08/07/2018  Medication Sig Note  . acetaminophen (TYLENOL) 325 MG tablet Take 650 mg by mouth every 6 (six) hours as needed.   [name] glucose blood (ONE TOUCH ULTRA TEST) test strip USE ONE STRIP TO CHECK GLUCOSE ONCE DAILY   . latanoprost (XALATAN) 0.005 % ophthalmic solution Place 1 drop into both eyes at bedtime.    [name] levothyroxine (SYNTHROID, LEVOTHROID) 50 MCG tablet TAKE ONE TABLET BY MOUTH ONCE DAILY   . metFORMIN (GLUCOPHAGE) 1000 MG tablet Take 1,000 mg by mouth daily with breakfast.  08/07/2018: Takes 500mg  once daily  . metoprolol tartrate  (LOPRESSOR) 25 MG tablet Take 0.5 tablets (12.5 mg total) by mouth 2 (two) times daily.   . ONE TOUCH ULTRA TEST test strip USE TO TEST BLOOD SUGAR ONCE DAILY   . ONETOUCH DELICA LANCETS 78E MISC USE ONE LANCET TO CHECK BLOOD GLUCOSE ONCE DAILY   . ONETOUCH DELICA LANCETS 42P MISC USE TO TEST BLOOD SUGAR ONCE DAILY   . simvastatin (ZOCOR) 40 MG tablet TAKE 1/2 (ONE-HALF) TABLET BY MOUTH AT BEDTIME   . warfarin (COUMADIN) 3 MG tablet Take 1 tablet (3 mg total) by mouth daily at 6 PM.    No facility-administered encounter medications on file as of 08/07/2018.     Functional Status:   In your present state of health, do you have any difficulty performing the following activities: 07/08/2018 06/30/2018  Hearing? Y Y  Comment - -  Vision? N N  Difficulty concentrating or making decisions? N N  Walking or climbing stairs? N N  Dressing or bathing? N N  Doing errands, shopping? N N  Preparing Food and eating ? N -  Using the Toilet? N -  In the past six months, have you accidently leaked urine? N -  Do you have problems with loss of bowel control? N -  Managing your Medications? N -  Managing your Finances? N -  Housekeeping or managing your Housekeeping? N -  Some recent data might be hidden    Fall/Depression Screening:    Fall Risk  07/08/2018 11/11/2017 10/30/2016  Falls in  the past year? No Yes No  Number falls in past yr: - 1 -  Injury with Fall? - No -  Comment - bruising -  Risk Factor Category  - - -  Follow up - Falls prevention discussed -   PHQ 2/9 Scores 07/08/2018 11/11/2017 11/11/2017 10/30/2016 06/13/2015  PHQ - 2 Score 0 0 0 0 0  PHQ- 9 Score - 0 - - -    Assessment:  Pleasant, well groomed male greeted RN CM at door. Ambulating independently. Steady gait. Patient does use cane when ambulating in a crowd. Patient denies complaints or concerns. Denies new or recent falls. He feels he has "healed well" from recent cardiovascular surgery and complication from coumadin therapy.  Per patient, he has recently been released from GI and should not return unless he develops "loose black stools" again.  Wife is anxious during this visit. Has lost of questions related to abnormal heart rhythms and their causes. Patient has recently developed frequent PVCs noted at last cardiology visit. Patient had consumed more caffeine than normal on day of visit. Follow up EKG today showed a decrease in the number of PVCs however patient does have a follow up related to new pacemaker with Dr. Jens Som on 11/21 and PVCs will also be addressed at that time. Basic education on possible causes of PVCs reviewed with patient and wife. Patient agrees to abstain from additional caffeine intake.  [facility] continues. PT released patient yesterday however RN continues to see patient weekly. Patient and wife state "I will be glad when they are done and we are not tied to a schedule". Per wife, she anticipates last visit on 10/31.  Patient remains compliant with all prescribed medications. Denies needs for additional Community CM services.  Discussed with patient transitioning from Community CM to [employer]. Patient does not feel he needs additional education by RN CM or Health Coach at this time. His DM and HTN are well controlled and last A1C was 5.5  Plan: Case Closure related to no other Case Management needs identified.  Patient is eligible for [facility] Chronic Case Management Services through PCP office if needed   Raylen Tangonan E. Rollene Rotunda RN, BSN [facility] Care Management Coordinator [phone]

## 2018-08-09 ENCOUNTER — Other Ambulatory Visit: Payer: Self-pay | Admitting: Family Medicine

## 2018-08-09 DIAGNOSIS — E039 Hypothyroidism, unspecified: Secondary | ICD-10-CM

## 2018-08-11 ENCOUNTER — Ambulatory Visit (INDEPENDENT_AMBULATORY_CARE_PROVIDER_SITE_OTHER): Payer: PPO | Admitting: Family Medicine

## 2018-08-11 ENCOUNTER — Encounter: Payer: Self-pay | Admitting: Family Medicine

## 2018-08-11 VITALS — BP 124/70 | HR 70 | Temp 98.0°F | Resp 20 | Wt 181.0 lb

## 2018-08-11 DIAGNOSIS — G4733 Obstructive sleep apnea (adult) (pediatric): Secondary | ICD-10-CM

## 2018-08-11 DIAGNOSIS — I10 Essential (primary) hypertension: Secondary | ICD-10-CM | POA: Diagnosis not present

## 2018-08-11 DIAGNOSIS — C649 Malignant neoplasm of unspecified kidney, except renal pelvis: Secondary | ICD-10-CM

## 2018-08-11 DIAGNOSIS — I2510 Atherosclerotic heart disease of native coronary artery without angina pectoris: Secondary | ICD-10-CM

## 2018-08-11 DIAGNOSIS — I421 Obstructive hypertrophic cardiomyopathy: Secondary | ICD-10-CM | POA: Diagnosis not present

## 2018-08-11 DIAGNOSIS — K254 Chronic or unspecified gastric ulcer with hemorrhage: Secondary | ICD-10-CM | POA: Diagnosis not present

## 2018-08-11 DIAGNOSIS — I4891 Unspecified atrial fibrillation: Secondary | ICD-10-CM | POA: Diagnosis not present

## 2018-08-11 DIAGNOSIS — D649 Anemia, unspecified: Secondary | ICD-10-CM

## 2018-08-11 DIAGNOSIS — I4892 Unspecified atrial flutter: Secondary | ICD-10-CM

## 2018-08-11 DIAGNOSIS — R5383 Other fatigue: Secondary | ICD-10-CM | POA: Diagnosis not present

## 2018-08-11 NOTE — Progress Notes (Signed)
Patient: [name] [name] Male    DOB: [birth year]   84 y.o.   MRN: [id number] Visit Date: 08/11/2018  Today's Provider: [name], MD   Chief Complaint  Patient presents with  . Follow-up   Subjective:    HPI Patient comes in today for a follow up. He was seen in the office 1 month ago after being in the hospital for a GI bleed. Patient reports that he is now released from GI. He has not had any abnormal stools since then.   Patient also mentions that he needs a new CPAP machine. His last sleep study was in 2012. He brought in forms to fax to [place] today.  He wears his CPAP nightly and feels better because of it.  Patient is also discharged from home health. It was recommended that he be referred to [facility] for further treatment and monitoring.       Allergies  Allergen Reactions  . Macrolides And Ketolides Other (See Comments)  . Mycinettes   . Nitrofuran Derivatives Other (See Comments)  . Nitrofurantoin Other (See Comments)  . Erythromycin Itching and Rash    Other reaction(s): UNKNOWN  And red skin     Current Outpatient Medications:  .  acetaminophen (TYLENOL) 325 MG tablet, Take 650 mg by mouth every 6 (six) hours as needed., Disp: , Rfl:  .  glucose blood (ONE TOUCH ULTRA TEST) test strip, USE ONE STRIP TO CHECK GLUCOSE ONCE DAILY, Disp: 100 each, Rfl: 12 .  latanoprost (XALATAN) 0.005 % ophthalmic solution, Place 1 drop into both eyes at bedtime. , Disp: , Rfl:  .  levothyroxine (SYNTHROID, LEVOTHROID) 50 MCG tablet, TAKE 1 TABLET BY MOUTH ONCE DAILY, Disp: 90 tablet, Rfl: 3 .  metFORMIN (GLUCOPHAGE) 1000 MG tablet, Take 1,000 mg by mouth daily with breakfast. , Disp: , Rfl:  .  metoprolol tartrate (LOPRESSOR) 25 MG tablet, Take 0.5 tablets (12.5 mg total) by mouth 2 (two) times daily., Disp: 90 tablet, Rfl: 2 .  ONE TOUCH ULTRA TEST test strip, USE TO TEST BLOOD SUGAR ONCE DAILY, Disp: 50 each, Rfl: 25 .  ONETOUCH DELICA LANCETS 83M MISC, USE  ONE LANCET TO CHECK BLOOD GLUCOSE ONCE DAILY, Disp: 100 each, Rfl: 12 .  ONETOUCH DELICA LANCETS 62H MISC, USE TO TEST BLOOD SUGAR ONCE DAILY, Disp: 100 each, Rfl: 12 .  simvastatin (ZOCOR) 40 MG tablet, TAKE 1/2 (ONE-HALF) TABLET BY MOUTH AT BEDTIME, Disp: 45 tablet, Rfl: 3 .  warfarin (COUMADIN) 3 MG tablet, Take 1 tablet (3 mg total) by mouth daily at 6 PM., Disp: 30 tablet, Rfl: 2  Review of Systems  Constitutional: Negative.   HENT: Negative.   Eyes: Negative.   Respiratory: Negative for cough and shortness of breath.   Cardiovascular: Negative for chest pain and palpitations.  Endocrine: Negative for cold intolerance, heat intolerance, polydipsia, polyphagia and polyuria.  Musculoskeletal: Negative for arthralgias, gait problem, joint swelling and neck pain.  Allergic/Immunologic: Negative for environmental allergies.  Neurological: Negative for dizziness, tremors, weakness, light-headedness and numbness.  Hematological: Bruises/bleeds easily.  Psychiatric/Behavioral: Negative.     Social History   Tobacco Use  . Smoking status: Former Smoker    Types: Cigars, Cigarettes    Last attempt to quit: 10/02/1974    Years since quitting: 43.8  . Smokeless tobacco: Never Used  . Tobacco comment: still smokes cigars once a day  Substance Use Topics  . Alcohol use: No    Frequency: Never  Objective:   BP 124/70 (BP Location: Left Arm, Patient Position: Sitting, Cuff Size: Normal)   Pulse 70 Comment: irregular  Temp 98 F (36.7 C)   Resp 20   Wt 181 lb (82.1 kg)   SpO2 98%   BMI 26.73 kg/m  Vitals:   08/11/18 1140  BP: 124/70  Pulse: 70  Resp: 20  Temp: 98 F (36.7 C)  SpO2: 98%  Weight: 181 lb (82.1 kg)     Physical Exam  Constitutional: He is oriented to person, place, and time. He appears well-developed and well-nourished.  HENT:  Head: Normocephalic and atraumatic.  Right Ear: External ear normal.  Left Ear: External ear normal.  Nose: Nose normal.    Mouth/Throat: Oropharynx is clear and moist.  Eyes: Conjunctivae are normal. No scleral icterus.  Neck: No JVD present. No thyromegaly present.  Cardiovascular: Normal rate, regular rhythm and normal heart sounds.  Pulmonary/Chest: Effort normal and breath sounds normal.  Abdominal: Soft.  Musculoskeletal: He exhibits no edema.  Neurological: He is alert and oriented to person, place, and time.  Skin: Skin is warm and dry.  Psychiatric: He has a normal mood and affect. His behavior is normal. Judgment and thought content normal.        Assessment & Plan:     1. Atrial fibrillation and flutter ([place]) More than 50% of 30 minute visit spent in counseling/coordination of care. - Amb Referral to Cardiac Rehabilitation  2. Essential (primary) hypertension   3. Obstructive sleep apnea Wears CPAP nightly and gets benefit--feels better.  4. Arteriosclerosis of coronary artery  - Amb Referral to Cardiac Rehabilitation  5. Anemia, unspecified type From GI bleed. - CBC with Differential/Platelet - Iron  6. Gastrointestinal hemorrhage associated with gastric ulcer F/u with GI. Felling better--no GI symptoms  7. Renal cell carcinoma, unspecified laterality ([place])   8. HOCM (hypertrophic obstructive cardiomyopathy) ([place]) Pt s/p Myomectomy for this. 9.Fatigue      I have done the exam and reviewed the above chart and it is accurate to the best of my knowledge. Development worker, community has been used in this note in any air is in the dictation or transcription are unintentional.  [name], MD  [place]

## 2018-08-12 DIAGNOSIS — B351 Tinea unguium: Secondary | ICD-10-CM | POA: Diagnosis not present

## 2018-08-12 DIAGNOSIS — E119 Type 2 diabetes mellitus without complications: Secondary | ICD-10-CM | POA: Diagnosis not present

## 2018-08-12 LAB — CBC WITH DIFFERENTIAL/PLATELET
BASOS: 1 %
Basophils Absolute: 0.1 10*3/uL (ref 0.0–0.2)
EOS (ABSOLUTE): 0.4 10*3/uL (ref 0.0–0.4)
EOS: 5 %
HEMATOCRIT: 34.6 % — AB (ref 37.5–51.0)
Hemoglobin: 11 g/dL — ABNORMAL LOW (ref 13.0–17.7)
Immature Grans (Abs): 0 10*3/uL (ref 0.0–0.1)
Immature Granulocytes: 1 %
LYMPHS ABS: 2.3 10*3/uL (ref 0.7–3.1)
Lymphs: 30 %
MCH: 29.8 pg (ref 26.6–33.0)
MCHC: 31.8 g/dL (ref 31.5–35.7)
MCV: 94 fL (ref 79–97)
MONOCYTES: 10 %
Monocytes Absolute: 0.8 10*3/uL (ref 0.1–0.9)
Neutrophils Absolute: 4.1 10*3/uL (ref 1.4–7.0)
Neutrophils: 53 %
Platelets: 286 10*3/uL (ref 150–450)
RBC: 3.69 x10E6/uL — AB (ref 4.14–5.80)
RDW: 14.1 % (ref 12.3–15.4)
WBC: 7.7 10*3/uL (ref 3.4–10.8)

## 2018-08-12 LAB — IRON: Iron: 63 ug/dL (ref 38–169)

## 2018-08-13 ENCOUNTER — Other Ambulatory Visit: Payer: Self-pay

## 2018-08-13 NOTE — Patient Outreach (Signed)
[facility]) Care Management  08/13/2018  [name] [name] 1934-05-17 [id number]  Late entry for telephone encounter that occurred today 08/13/18 at 2:00pm  Care Coordination: RN CM received a call from patients wife, [name] [name], requesting consultation regarding "a new symptom" the patient begin having today.  The above patient is a former [facility] Community Case Management patient of this RN CM. Patient is status post AVR/MVR/Myectomy with sternal plating, and placement of a PPM during hospitalization 05/23/18-06/15/18.   Ms. Petrucci states that patient is complaining with left upper chest discomfort. Per wife, patient describes the pain as "sore" in nature. Wife also states patient had a mild episode of dizziness while driving that resolved spontaneously. Wife sounds extremely anxious and worried about patients complaints stating "I'm pressing on his pacemaker to see if it hurts". RN CM requested to speak with patient.  Patient describes his pain as shoulder pain, sore in nature "like it used to get when I played tennis". Patient denies new or recent injury. Denies pulling or twisting motions with left shoulder. "I do use my arms to pull up off the toilet".  Patient denies any chest pain, pressure, or discomfort that he feels could be from his heart. States pain is in the joint of the shoulder. He denies any shortness of breath, N/V, or dizziness. The brief episode of dizziness that occurred earlier this morning has resolved and is non recurrent at this time.  Intervention: RN CM discussed patients described symptoms as stated above with Dr. Rosanna Randy as RN CM is now embedded in [facility]. (Patient is not currently enrolled in the CCM program)   Discussed with patient symptoms that would require emergent care(911). Patient verbalized understanding.  Outcome: Patient is scheduled for follow up with Dr. Rosanna Randy tomorrow 08/14/18 at 1:40pm.  [name] E.  Rollene Rotunda RN, BSN [facility] Care Management Coordinator [phone]

## 2018-08-14 ENCOUNTER — Ambulatory Visit (INDEPENDENT_AMBULATORY_CARE_PROVIDER_SITE_OTHER): Payer: PPO | Admitting: Family Medicine

## 2018-08-14 VITALS — BP 124/62 | HR 55 | Temp 97.8°F

## 2018-08-14 DIAGNOSIS — Z9989 Dependence on other enabling machines and devices: Secondary | ICD-10-CM | POA: Diagnosis not present

## 2018-08-14 DIAGNOSIS — I482 Chronic atrial fibrillation, unspecified: Secondary | ICD-10-CM | POA: Diagnosis not present

## 2018-08-14 DIAGNOSIS — G4733 Obstructive sleep apnea (adult) (pediatric): Secondary | ICD-10-CM

## 2018-08-14 DIAGNOSIS — I10 Essential (primary) hypertension: Secondary | ICD-10-CM

## 2018-08-14 DIAGNOSIS — M25512 Pain in left shoulder: Secondary | ICD-10-CM

## 2018-08-14 NOTE — Progress Notes (Signed)
[name] [name]  MRN: [id number] DOB: 02/04/1934  Subjective:  HPI   The patient is an 22 yea old male who presents for evaluation of left shoulder pain that started yesterday after he had lifted some laundry.  He states that he feels like it is in his shoulder joint.  It radiates a little into his arm.  He denies any SOB or sweats.  Patient denies any pain now.  The patient also states he is in need of new CPAP machine.  He states he uses his every night faithfully and can tell that it is helping.  On the rare occasion he has not been able to use it he notices the difference the next day.     Patient Active Problem List   Diagnosis Date Noted  . Blood in stool   . Bleeding duodenal ulcer   . GIB (gastrointestinal bleeding) 06/30/2018  . Acute upper GI bleed   . Symptomatic anemia   . Heart murmur, systolic 40/98/1191  . H/O: rheumatic fever 03/21/2017  . Tinnitus 10/30/2016  . On warfarin therapy 07/06/2015  . Arteriosclerosis of coronary artery 06/13/2015  . Carcinoma in situ of bladder 06/13/2015  . Diabetes mellitus, type 2 ([place]) 06/13/2015  . Diabetic neuropathy ([place]) 06/13/2015  . Dizziness 06/13/2015  . Essential (primary) hypertension 06/13/2015  . Fatigue 06/13/2015  . Glaucoma 06/13/2015  . Hypercholesteremia 06/13/2015  . Male hypogonadism 06/13/2015  . Adult hypothyroidism 06/13/2015  . Arthritis, degenerative 06/13/2015  . CAP (community acquired pneumonia) 06/13/2015  . Adenocarcinoma, renal cell ([place]) 06/13/2015  . Benign head tremor 12/13/2014  . Carotid arterial disease ([place]) 07/12/2014  . Decreased cardiac output 07/12/2014  . Cardiomyopathy, hypertrophic obstructive ([place]) 07/12/2014  . Head injuries 07/12/2014  . HOCM (hypertrophic obstructive cardiomyopathy) ([place])   . Encounter for therapeutic drug monitoring 11/16/2013  . Obstructive sleep apnea   . Benign prostatic hypertrophy without urinary obstruction 06/09/2012  . History of neoplasm of  bladder 06/09/2012  . Head injury   . Atherosclerosis of aorta ([place])   . Hypotension   . Stroke ([place])   . Unsteady gait   . Excessive sweating   . Nausea   . ORTHOSTATIC HYPOTENSION, HX OF 09/01/2010  . Overweight(278.02) 03/07/2009  . MITRAL REGURGITATION 03/05/2009  . Chronic atrial fibrillation 03/05/2009  . Atrial fibrillation and flutter ([place]) 03/05/2009  . Mitral and aortic incompetence 03/05/2009    Past Medical History:  Diagnosis Date  . Atherosclerosis of aorta ([place])    by CT scan in past  . Atrial fibrillation ([place])    and aflutter. pt has a left atrial circuit that is not ablated. was on amiodarone-stopped, now use rate control.   . Bladder cancer ([place])    hx; treated with BCG in past   . Carotid artery disease ([place])    There was calcified plaque but no stenosis by carotid artery screening done at [facility] October, 2013  . CHB (complete heart block) ([place])    a. 05/2018 s/p BSX VVIR PPM ([facility]) in setting of bradycardia following myomectomy and AVR/MVR.  . Diabetes mellitus    type not specified  . Diabetic neuropathy ([facility])    feet  . Elevated liver enzymes    over time; hx  . Excessive sweating   . Glaucoma   . Gout   . Head injury    when slipped on ice 2004-2005. stabilized and back on Coumadin   . Headache    migraines - distant past  .  History of cardiac cath    a. 05/2018 Cath ([facility]): Non-obstructive CAD. Mildly elevated filling pressures w/ nl CI.  [name] HOCM (hypertrophic obstructive cardiomyopathy) ([place])    a. 01/2016 Echo: EF 65-70%, no rwma, LVOT gradient of 80-51mmHg, mod AS, SAM; b. 11/2017 Echo: EF 55-60%, near cavity obliteration in systole, mod AS, mild MS; c. 04/2018 Card MRI ([facility]): Sev LVH, EF >70%, Sev AS, LVOT obs w/ Sev MR, mild to mod TR/PR, mid-myocardial HE in basal-mid anteroseptum and inferoseptum; d. 05/2018 s/p Septal myomectomy; e. 05/2018 Echo: Nl EF w/ sev LVH. Mech AoV and MV.  . Homocystinemia ([place])    elevated, mild     . Hypercholesterolemia    treated.   . Hypertension   . Kidney mass    laproscopic surgery woth cryoablation of a mass outside kidney followed at [facility].   . Moderate aortic stenosis    a. 01/2016 echo: mod AS.   [name] Motion sickness    ocean boats  . Nausea   . Obstructive sleep apnea    CPAP started successfully 2014  . Orthostatic hypotension    a. orthostatic. dehydration. hospitalized 11/11  . Sleep apnea    Significant obstructive sleep apnea diagnosed in August, 2012, the patient is to receive CPAP   . Stroke [facility])    "2 old strokes" CT and MRI. [place] [facility] 11/11. diagnosis was dehydration, no acutal reports.   . TSH elevation    on synthroid historically   . Unsteady gait    August, 2012    Social History   Socioeconomic History  . Marital status: Married    Spouse name: Jasmine Awe  . Number of children: 1  . Years of education: 67  . Highest education level: Bachelor's degree (e.g., BA, AB, BS)  Occupational History  . Occupation: retired  Scientific laboratory technician  . Financial resource strain: Not hard at all  . Food insecurity:    Worry: Never true    Inability: Never true  . Transportation needs:    Medical: No    Non-medical: No  Tobacco Use  . Smoking status: Former Smoker    Types: Cigars, Cigarettes    Last attempt to quit: 10/02/1974    Years since quitting: 43.8  . Smokeless tobacco: Never Used  . Tobacco comment: still smokes cigars once a day  Substance and Sexual Activity  . Alcohol use: No    Frequency: Never  . Drug use: No  . Sexual activity: Yes  Lifestyle  . Physical activity:    Days per week: 0 days    Minutes per session: 0 min  . Stress: Not at all  Relationships  . Social connections:    Talks on phone: More than three times a week    Gets together: More than three times a week    Attends religious service: More than 4 times per year    Active member of club or organization: Yes    Attends meetings of clubs or organizations: More  than 4 times per year    Relationship status: Married  . Intimate partner violence:    Fear of current or ex partner: No    Emotionally abused: No    Physically abused: No    Forced sexual activity: No  Other Topics Concern  . Not on file  Social History Narrative   Married, retired, gets regular exercise.     Outpatient Encounter Medications as of 08/14/2018  Medication Sig Note  . acetaminophen (TYLENOL) 325  MG tablet Take 650 mg by mouth every 6 (six) hours as needed.   [name] glucose blood (ONE TOUCH ULTRA TEST) test strip USE ONE STRIP TO CHECK GLUCOSE ONCE DAILY   . latanoprost (XALATAN) 0.005 % ophthalmic solution Place 1 drop into both eyes at bedtime.    [name] levothyroxine (SYNTHROID, LEVOTHROID) 50 MCG tablet TAKE 1 TABLET BY MOUTH ONCE DAILY   . metFORMIN (GLUCOPHAGE) 1000 MG tablet Take 1,000 mg by mouth daily with breakfast.  08/07/2018: Takes 500mg  once daily  . metoprolol tartrate (LOPRESSOR) 25 MG tablet Take 0.5 tablets (12.5 mg total) by mouth 2 (two) times daily.   . ONE TOUCH ULTRA TEST test strip USE TO TEST BLOOD SUGAR ONCE DAILY   . ONETOUCH DELICA LANCETS 73U MISC USE ONE LANCET TO CHECK BLOOD GLUCOSE ONCE DAILY   . ONETOUCH DELICA LANCETS 20U MISC USE TO TEST BLOOD SUGAR ONCE DAILY   . simvastatin (ZOCOR) 40 MG tablet TAKE 1/2 (ONE-HALF) TABLET BY MOUTH AT BEDTIME   . warfarin (COUMADIN) 3 MG tablet Take 1 tablet (3 mg total) by mouth daily at 6 PM.    No facility-administered encounter medications on file as of 08/14/2018.     Allergies  Allergen Reactions  . Macrolides And Ketolides Other (See Comments)  . Mycinettes   . Nitrofuran Derivatives Other (See Comments)  . Nitrofurantoin Other (See Comments)  . Erythromycin Itching and Rash    Other reaction(s): UNKNOWN  And red skin    Review of Systems  Constitutional: Negative for diaphoresis.  HENT: Negative.   Eyes: Negative.   Respiratory: Negative.   Cardiovascular: Negative for chest pain, orthopnea  and leg swelling.  Gastrointestinal: Negative.   Musculoskeletal: Positive for joint pain and myalgias.  Skin: Negative.   Neurological: Negative.   Endo/Heme/Allergies: Negative.   Psychiatric/Behavioral: Negative.     Objective:  BP 124/62 (BP Location: Right Arm, Patient Position: Sitting, Cuff Size: Normal)   Pulse (!) 55   Temp 97.8 F (36.6 C) (Oral)   Physical Exam  Constitutional: He is oriented to person, place, and time and well-developed, well-nourished, and in no distress.  HENT:  Head: Normocephalic and atraumatic.  Eyes: Conjunctivae are normal. No scleral icterus.  Neck: No thyromegaly present.  Cardiovascular: Normal rate, regular rhythm and normal heart sounds.  Pulmonary/Chest: Effort normal and breath sounds normal.  Abdominal: Soft.  Musculoskeletal: He exhibits no edema.  Neurological: He is alert and oriented to person, place, and time. Gait normal. GCS score is 15.  Skin: Skin is warm and dry.  Psychiatric: Mood, memory, affect and judgment normal.    Assessment and Plan :   1. Acute pain of left shoulder MSK pain. I do not think this is cardiac or other worriesome pathology.  2. OSA on CPAP Patient is using nightly with good relief  3. Chronic atrial fibrillation   4. Essential (primary) hypertension    HPI, Exam and A&P Transcribed under the direction and in the presence of Miguel Aschoff, Brooke Bonito., MD. Electronically Signed: Althea Charon, RMA  I have done the exam and reviewed the chart and it is accurate to the best of my knowledge. Development worker, community has been used and  any errors in dictation or transcription are unintentional. Miguel Aschoff M.D. [facility]

## 2018-08-18 ENCOUNTER — Telehealth: Payer: Self-pay | Admitting: Family Medicine

## 2018-08-18 ENCOUNTER — Ambulatory Visit: Payer: PPO

## 2018-08-18 DIAGNOSIS — Z952 Presence of prosthetic heart valve: Secondary | ICD-10-CM

## 2018-08-18 DIAGNOSIS — I482 Chronic atrial fibrillation, unspecified: Secondary | ICD-10-CM | POA: Diagnosis not present

## 2018-08-18 DIAGNOSIS — I4891 Unspecified atrial fibrillation: Secondary | ICD-10-CM | POA: Diagnosis not present

## 2018-08-18 DIAGNOSIS — I4892 Unspecified atrial flutter: Secondary | ICD-10-CM

## 2018-08-18 DIAGNOSIS — Z5181 Encounter for therapeutic drug level monitoring: Secondary | ICD-10-CM | POA: Diagnosis not present

## 2018-08-18 DIAGNOSIS — Z950 Presence of cardiac pacemaker: Secondary | ICD-10-CM

## 2018-08-18 LAB — POCT INR: INR: 1.6 — AB (ref 2.0–3.0)

## 2018-08-18 NOTE — Patient Instructions (Addendum)
Please take 2 tablets tonight & tomorrow, then START NEW DOSAGE of 1 tablet every day except 1.5 on [place]. Recheck in 1 week.

## 2018-08-18 NOTE — Progress Notes (Signed)
Pt's wife is tearful during visit, voicing frustration that pt's test results are all out of normal range, "his EKG is all over the place, his echo had scary things on it and now his INR is out of range" and that "no one seems to care".  She is upset that pt's PCP has "chastised" her for reviewing pt's tests on MyChart and Googling terms she does not understand.  She states that she is quite anxious about her husband's health and does not like feeling like his doctor is "dismissing her". Allowed her to voice all of her concerns and reviewed test results w/ her.  Her main concern is that pt had heart surgery @ [facility] in Aug and has not followed up w/ anyone.  [facility] doctor d/c'd him from their care and told them to f/u w/ Dr. Fletcher Anon.  Pt has appt w/ Dr. Fletcher Anon next week and they "have waited over 2 months for this appt".  She states that she is upset that pt had major surgery and he should be followed more closely.  I asked pt's wife if she would allow me to bring in our office manager to voice her concerns; she is agreeable to this.  After speaking w/ Ezra Sites called Dr. Fletcher Anon and he will see pt tomorrow @ 1:20.  Pt and his wife are very happy w/ this and grateful for the assistance.  Pt provided w/ Nancy's business card, which has her direct # in the event that pt should need to contact her. Pt and wife are in much better spirits on leaving and asked for hugs and prayers.

## 2018-08-18 NOTE — Telephone Encounter (Signed)
[name] [name] needs last OV note that states patient is using his CPAP and is benefiting from this. Or [name] [name] said she could talk to [place] regarding this.

## 2018-08-18 NOTE — Telephone Encounter (Signed)
Patient uses it faithfully every day.  Depends on it as he can tell a difference if he were to not use it.

## 2018-08-19 ENCOUNTER — Ambulatory Visit: Payer: PPO | Admitting: Cardiovascular Disease

## 2018-08-19 ENCOUNTER — Encounter: Payer: Self-pay | Admitting: Cardiovascular Disease

## 2018-08-19 ENCOUNTER — Other Ambulatory Visit: Payer: Self-pay

## 2018-08-19 ENCOUNTER — Telehealth: Payer: Self-pay | Admitting: Family Medicine

## 2018-08-19 VITALS — BP 110/64 | HR 88 | Ht 69.0 in | Wt 183.0 lb

## 2018-08-19 DIAGNOSIS — E785 Hyperlipidemia, unspecified: Secondary | ICD-10-CM

## 2018-08-19 DIAGNOSIS — I482 Chronic atrial fibrillation, unspecified: Secondary | ICD-10-CM | POA: Diagnosis not present

## 2018-08-19 DIAGNOSIS — Z952 Presence of prosthetic heart valve: Secondary | ICD-10-CM | POA: Diagnosis not present

## 2018-08-19 DIAGNOSIS — I493 Ventricular premature depolarization: Secondary | ICD-10-CM | POA: Diagnosis not present

## 2018-08-19 MED ORDER — METFORMIN HCL 1000 MG PO TABS: 1000.0000 mg | ORAL_TABLET | Freq: Every day | ORAL | 3 refills | Status: DC

## 2018-08-19 NOTE — Telephone Encounter (Signed)
She would like you to call her back at  [phone]  Thanks  teri

## 2018-08-19 NOTE — Telephone Encounter (Signed)
Patient has only been taking 500 mg of Metformin.  Apparently when they went to get his medication refilled the wrong bottle was taken.  He is going to take 2 a day until out and then he will take the 1000 mg pill that has been sent tot he pharmacy.

## 2018-08-19 NOTE — Progress Notes (Signed)
Cardiology Office Note   Date:  08/19/2018   ID:  [name] [name], [name] [name] [birth year], MRN [id number]  PCP:  [name], MD  Cardiologist:   Kathlyn Sacramento, MD   Chief Complaint  Patient presents with  . OTHER    Discuss EKG/echo/coumadin c/o chest discomfort around pacemaker site. Meds reviewed verbally with pt.      History of Present Illness: [name] [name] is a 84 y.o. male who presents for for a follow-up visit regarding chronic atrial fibrillation, hypertrophic obstructive cardiomyopathy and aortic stenosis.   He is active. He volunteers regularly at [employer].  He was hospitalized in December, 2018 with pneumonia.  He improved with antibiotics.  He had issues with tachycardia and hypotension while hospitalized.  The patient had worsening shortness of breath and fatigue since that time.  He was switched from verapamil to metoprolol for better rate control.  Echocardiogram showed normal LV systolic function,  moderate aortic stenosis, mild mitral stenosis and mild pulmonary hypertension with peak systolic pressure of 47 mmHg.  LVOT gradient could not be estimated.  Due to patient's continued symptoms, we elected to refer him to Dr. Jacelyn Grip at [facility].  The patient underwent a right and left cardiac catheterization.  It showed no significant coronary artery disease.  He did undergo cardiac MRI which showed hyperdynamic LV systolic function with severe septal hypertrophy, severely dilated left atrium, severe aortic stenosis and severe mitral regurgitation.  He underwent septal myomectomy with mechanical AVR and MVR at [facility] in September.  Postoperative course was complicated by acute blood loss anemia requiring transfusion as well as pleural effusion requiring thoracentesis and complete heart block with placement of [employer]'s single lead permanent pacemaker.  His hospitalization was prolonged and ultimately when he was sent to rehab.  He was hospitalized at  [facility] after that due to GI bleed in the setting of supratherapeutic INR.  EGD showed duodenal ulcer with visible vessel that was treated with hemo spray Recent echocardiogram showed an EF of 60 to 65%, intact mechanical aortic valve with a mean gradient of 8 mmHg and normal functioning mechanical mitral valve with a mean gradient of 6 mmHg.  Systolic pulmonary pressure was mildly elevated at 36 mmHg. He was recently noted to have PVCs and thus small dose metoprolol was resumed.  He has been doing reasonably well overall with no chest pain or significant dyspnea.  No leg edema.  His INR was subtherapeutic yesterday at 1.6 and the dose of warfarin was increased.  Past Medical History:  Diagnosis Date  . Atherosclerosis of aorta ([place])    by CT scan in past  . Atrial fibrillation ([place])    and aflutter. pt has a left atrial circuit that is not ablated. was on amiodarone-stopped, now use rate control.   . Bladder cancer ([place])    hx; treated with BCG in past   . Carotid artery disease ([place])    There was calcified plaque but no stenosis by carotid artery screening done at [facility] October, 2013  . CHB (complete heart block) ([place])    a. 05/2018 s/p BSX VVIR PPM ([facility]) in setting of bradycardia following myomectomy and AVR/MVR.  . Diabetes mellitus    type not specified  . Diabetic neuropathy (HCC)    feet  . Elevated liver enzymes    over time; hx  . Excessive sweating   . Glaucoma   . Gout   . Head injury    when  slipped on ice 2004-2005. stabilized and back on Coumadin   . Headache    migraines - distant past  . History of cardiac cath    a. 05/2018 Cath ([facility]): Non-obstructive CAD. Mildly elevated filling pressures w/ nl CI.  [name] HOCM (hypertrophic obstructive cardiomyopathy) ([place])    a. 01/2016 Echo: EF 65-70%, no rwma, LVOT gradient of 80-76mmHg, mod AS, SAM; b. 11/2017 Echo: EF 55-60%, near cavity obliteration in systole, mod AS, mild MS; c. 04/2018 Card MRI ([facility]): Sev LVH, EF  >70%, Sev AS, LVOT obs w/ Sev MR, mild to mod TR/PR, mid-myocardial HE in basal-mid anteroseptum and inferoseptum; d. 05/2018 s/p Septal myomectomy; e. 05/2018 Echo: Nl EF w/ sev LVH. Mech AoV and MV.  . Homocystinemia ([place])    elevated, mild   . Hypercholesterolemia    treated.   . Hypertension   . Kidney mass    laproscopic surgery woth cryoablation of a mass outside kidney followed at [facility].   . Moderate aortic stenosis    a. 01/2016 echo: mod AS.   [name] Motion sickness    ocean boats  . Nausea   . Obstructive sleep apnea    CPAP started successfully 2014  . Orthostatic hypotension    a. orthostatic. dehydration. hospitalized 11/11  . Sleep apnea    Significant obstructive sleep apnea diagnosed in August, 2012, the patient is to receive CPAP   . Stroke [facility])    "2 old strokes" CT and MRI. [place] [facility] 11/11. diagnosis was dehydration, no acutal reports.   . TSH elevation    on synthroid historically   . Unsteady gait    August, 2012    Past Surgical History:  Procedure Laterality Date  . BLADDER SURGERY    . CATARACT EXTRACTION W/PHACO Left 05/11/2015   Procedure: CATARACT EXTRACTION PHACO AND INTRAOCULAR LENS PLACEMENT (IOC);  Surgeon: Leandrew Koyanagi, MD;  Location: [place];  Service: Ophthalmology;  Laterality: Left;  DIABETIC - oral meds, CPAP  . ESOPHAGOGASTRODUODENOSCOPY (EGD) WITH PROPOFOL N/A 07/01/2018   Procedure: ESOPHAGOGASTRODUODENOSCOPY (EGD) WITH PROPOFOL;  Surgeon: [name], MD;  Location: [facility] ENDOSCOPY;  Service: Endoscopy;  Laterality: N/A;  . INSERT / REPLACE / REMOVE PACEMAKER     Chemical engineer   . KIDNEY SURGERY     "froze mass"  . MECHANICAL AORTIC AND MITRAL VALVE REPLACEMENT  05/2018   [facility]   . TONSILLECTOMY    . VALVE REPLACEMENT       Current Outpatient Medications  Medication Sig Dispense Refill  . acetaminophen (TYLENOL) 325 MG tablet Take 650 mg by mouth every 6 (six) hours as needed.    [name] glucose blood (ONE TOUCH  ULTRA TEST) test strip USE ONE STRIP TO CHECK GLUCOSE ONCE DAILY 100 each 12  . latanoprost (XALATAN) 0.005 % ophthalmic solution Place 1 drop into both eyes at bedtime.     [name] levothyroxine (SYNTHROID, LEVOTHROID) 50 MCG tablet TAKE 1 TABLET BY MOUTH ONCE DAILY 90 tablet 3  . metFORMIN (GLUCOPHAGE) 1000 MG tablet Take 1 tablet (1,000 mg total) by mouth daily with breakfast. 90 tablet 3  . metoprolol tartrate (LOPRESSOR) 25 MG tablet Take 12.5 mg by mouth 2 (two) times daily.    . NON FORMULARY Metoprolol 50 mg qd.    . ONE TOUCH ULTRA TEST test strip USE TO TEST BLOOD SUGAR ONCE DAILY 50 each 25  . ONETOUCH DELICA LANCETS 52W MISC USE ONE LANCET TO CHECK BLOOD GLUCOSE ONCE DAILY 100 each 12  . ONETOUCH  DELICA LANCETS 95M MISC USE TO TEST BLOOD SUGAR ONCE DAILY 100 each 12  . simvastatin (ZOCOR) 40 MG tablet TAKE 1/2 (ONE-HALF) TABLET BY MOUTH AT BEDTIME 45 tablet 3  . warfarin (COUMADIN) 3 MG tablet Take 1 tablet (3 mg total) by mouth daily at 6 PM. 30 tablet 2   No current facility-administered medications for this visit.     Allergies:   Macrolides and ketolides; Mycinettes; Nitrofuran derivatives; Nitrofurantoin; and Erythromycin    Social History:  The patient  reports that he quit smoking about 43 years ago. His smoking use included cigars and cigarettes. He has never used smokeless tobacco. He reports that he does not drink alcohol or use drugs.   Family History:  The patient's family history includes Arrhythmia in his brother and father; Breast cancer in his mother; Prostate cancer in his father; Stroke in his father.    ROS:  Please see the history of present illness.   Otherwise, review of systems are positive for none.   All other systems are reviewed and negative.    PHYSICAL EXAM: VS:  BP 110/64 (BP Location: Left Arm, Patient Position: Sitting, Cuff Size: Normal)   Pulse 88   Ht 5\' 9"  (1.753 m)   Wt 183 lb (83 kg)   BMI 27.02 kg/m  , BMI Body mass index is 27.02  kg/m. GEN: Well nourished, well developed, in no acute distress  HEENT: normal  Neck: no JVD, carotid bruits, or masses Cardiac: Irregularly irregular; no rubs, or gallops,no edema .  Normal mechanical heart sound. Respiratory:  clear to auscultation bilaterally, normal work of breathing GI: soft, nontender, nondistended, + BS MS: no deformity or atrophy  Skin: warm and dry, no rash Neuro:  Strength and sensation are intact Psych: euthymic mood, full affect   EKG:  EKG is ordered today. The ekg ordered today demonstrates ventricular paced rhythm with PVCs and underlying atrial fibrillation.   Recent Labs: 07/10/2018: ALT 9; TSH 2.960 07/29/2018: BUN 13; Creatinine, Ser 1.01; Magnesium 2.0; Potassium 4.7; Sodium 137 08/11/2018: Hemoglobin 11.0; Platelets 286    Lipid Panel    Component Value Date/Time   CHOL 147 11/12/2017 0832   TRIG 120 11/12/2017 0832   HDL 41 11/12/2017 0832   CHOLHDL 4.1 12/10/2016 0953   LDLCALC 82 11/12/2017 0832      Wt Readings from Last 3 Encounters:  08/19/18 183 lb (83 kg)  08/11/18 181 lb (82.1 kg)  08/07/18 180 lb (81.6 kg)        ASSESSMENT AND PLAN:  1.  Chronic atrial fibrillation: Ventricular rate is controlled on small dose metoprolol.  Continue long-term anticoagulation with a target INR between 2.5 and 3.5 given mechanical aortic and mitral valves.    2.  Status post aortic and mitral valve replacement with mechanical valve: These were functioning normally on recent echocardiogram.  I referred the patient to cardiac rehab.  3. Hypertrophic obstructive cardiomyopathy: Status post successful myomectomy.  4. Hyperlipidemia : Currently on simvastatin 40 mg daily.  5.  PVCs: Overall asymptomatic.  Continue small dose metoprolol.  6.  Status post permanent pacemaker placement post surgery due to complete heart block: The patient has an appointment scheduled with Dr. Caryl Comes.   Disposition:   FU with me in 2  months  Signed,  Kathlyn Sacramento, MD  08/19/2018 1:45 PM    [place]

## 2018-08-19 NOTE — Telephone Encounter (Signed)
Pt's wife is requesting Jiles Garter return her call today she would like to discuss pt's medication. Wife wouldn't give any other details. Please advise. Thanks TNP

## 2018-08-19 NOTE — Patient Instructions (Signed)
Medication Instructions:  No changes  If you need a refill on your cardiac medications before your next appointment, please call your pharmacy.   Lab work: None ordered  Testing/Procedures: None ordered  Follow-Up: At [facility], you and your health needs are our priority.  As part of our continuing mission to provide you with exceptional heart care, we have created designated Provider Care Teams.  These Care Teams include your primary Cardiologist (physician) and Advanced Practice Providers (APPs -  Physician Assistants and Nurse Practitioners) who all work together to provide you with the care you need, when you need it. You will need a follow up appointment in 2 months.  You may see Kathlyn Sacramento, MD or one of the following Advanced Practice Providers on your designated Care Team:   Murray Hodgkins, NP Christell Faith, PA-C . Marrianne Mood, PA-C  Any Other Special Instructions Will Be Listed Below (If Applicable). A referral has been placed for cardiac rehab.

## 2018-08-21 DIAGNOSIS — Z950 Presence of cardiac pacemaker: Secondary | ICD-10-CM | POA: Diagnosis not present

## 2018-08-21 DIAGNOSIS — M1990 Unspecified osteoarthritis, unspecified site: Secondary | ICD-10-CM | POA: Diagnosis not present

## 2018-08-21 DIAGNOSIS — Z48812 Encounter for surgical aftercare following surgery on the circulatory system: Secondary | ICD-10-CM | POA: Diagnosis not present

## 2018-08-21 DIAGNOSIS — Z7982 Long term (current) use of aspirin: Secondary | ICD-10-CM | POA: Diagnosis not present

## 2018-08-21 DIAGNOSIS — I2510 Atherosclerotic heart disease of native coronary artery without angina pectoris: Secondary | ICD-10-CM | POA: Diagnosis not present

## 2018-08-21 DIAGNOSIS — E119 Type 2 diabetes mellitus without complications: Secondary | ICD-10-CM | POA: Diagnosis not present

## 2018-08-21 DIAGNOSIS — Z7984 Long term (current) use of oral hypoglycemic drugs: Secondary | ICD-10-CM | POA: Diagnosis not present

## 2018-08-21 DIAGNOSIS — I4891 Unspecified atrial fibrillation: Secondary | ICD-10-CM | POA: Diagnosis not present

## 2018-08-21 DIAGNOSIS — E785 Hyperlipidemia, unspecified: Secondary | ICD-10-CM | POA: Diagnosis not present

## 2018-08-21 DIAGNOSIS — Z85528 Personal history of other malignant neoplasm of kidney: Secondary | ICD-10-CM | POA: Diagnosis not present

## 2018-08-21 DIAGNOSIS — Z8551 Personal history of malignant neoplasm of bladder: Secondary | ICD-10-CM | POA: Diagnosis not present

## 2018-08-21 DIAGNOSIS — Z7901 Long term (current) use of anticoagulants: Secondary | ICD-10-CM | POA: Diagnosis not present

## 2018-08-21 DIAGNOSIS — F17210 Nicotine dependence, cigarettes, uncomplicated: Secondary | ICD-10-CM | POA: Diagnosis not present

## 2018-08-21 DIAGNOSIS — Z952 Presence of prosthetic heart valve: Secondary | ICD-10-CM | POA: Diagnosis not present

## 2018-08-21 DIAGNOSIS — E039 Hypothyroidism, unspecified: Secondary | ICD-10-CM | POA: Diagnosis not present

## 2018-08-22 DIAGNOSIS — H401131 Primary open-angle glaucoma, bilateral, mild stage: Secondary | ICD-10-CM | POA: Diagnosis not present

## 2018-08-22 DIAGNOSIS — D3131 Benign neoplasm of right choroid: Secondary | ICD-10-CM | POA: Diagnosis not present

## 2018-08-22 LAB — HM DIABETES EYE EXAM

## 2018-08-25 ENCOUNTER — Ambulatory Visit: Payer: PPO

## 2018-08-25 DIAGNOSIS — I482 Chronic atrial fibrillation, unspecified: Secondary | ICD-10-CM | POA: Diagnosis not present

## 2018-08-25 DIAGNOSIS — I4892 Unspecified atrial flutter: Secondary | ICD-10-CM | POA: Diagnosis not present

## 2018-08-25 DIAGNOSIS — Z5181 Encounter for therapeutic drug level monitoring: Secondary | ICD-10-CM

## 2018-08-25 DIAGNOSIS — Z952 Presence of prosthetic heart valve: Secondary | ICD-10-CM

## 2018-08-25 DIAGNOSIS — Z950 Presence of cardiac pacemaker: Secondary | ICD-10-CM

## 2018-08-25 DIAGNOSIS — I4891 Unspecified atrial fibrillation: Secondary | ICD-10-CM

## 2018-08-25 LAB — POCT INR: INR: 1.9 — AB (ref 2.0–3.0)

## 2018-08-25 NOTE — Patient Instructions (Signed)
Please take 2 tablets tonight & tomorrow, then START NEW DOSAGE of 1 tablet every day except 1.5 on MONDAYS, [employer]. Recheck in 1 week.

## 2018-08-26 ENCOUNTER — Ambulatory Visit: Payer: PPO | Admitting: Cardiovascular Disease

## 2018-08-28 ENCOUNTER — Telehealth: Payer: Self-pay | Admitting: Family Medicine

## 2018-08-28 NOTE — Telephone Encounter (Signed)
Nichole didn't you fax these yesterday? Thanks

## 2018-08-28 NOTE — Telephone Encounter (Signed)
[facility] - [phone]  Regarding CPap Machine - needing most recent office notes.  Thanks, [employer]

## 2018-09-01 ENCOUNTER — Encounter: Payer: Self-pay | Admitting: Urology

## 2018-09-01 ENCOUNTER — Ambulatory Visit: Payer: PPO | Admitting: Urology

## 2018-09-01 ENCOUNTER — Ambulatory Visit: Payer: PPO

## 2018-09-01 VITALS — BP 134/74 | HR 82 | Ht 69.0 in | Wt 181.3 lb

## 2018-09-01 DIAGNOSIS — Z950 Presence of cardiac pacemaker: Secondary | ICD-10-CM

## 2018-09-01 DIAGNOSIS — Z8551 Personal history of malignant neoplasm of bladder: Secondary | ICD-10-CM | POA: Diagnosis not present

## 2018-09-01 DIAGNOSIS — I482 Chronic atrial fibrillation, unspecified: Secondary | ICD-10-CM

## 2018-09-01 DIAGNOSIS — Z952 Presence of prosthetic heart valve: Secondary | ICD-10-CM

## 2018-09-01 DIAGNOSIS — I4891 Unspecified atrial fibrillation: Secondary | ICD-10-CM | POA: Diagnosis not present

## 2018-09-01 DIAGNOSIS — Z5181 Encounter for therapeutic drug level monitoring: Secondary | ICD-10-CM | POA: Diagnosis not present

## 2018-09-01 DIAGNOSIS — I4892 Unspecified atrial flutter: Secondary | ICD-10-CM

## 2018-09-01 DIAGNOSIS — Z8603 Personal history of neoplasm of uncertain behavior: Secondary | ICD-10-CM | POA: Diagnosis not present

## 2018-09-01 LAB — URINALYSIS, COMPLETE
BILIRUBIN UA: NEGATIVE
Glucose, UA: NEGATIVE
Ketones, UA: NEGATIVE
Leukocytes, UA: NEGATIVE
Nitrite, UA: NEGATIVE
PH UA: 6 (ref 5.0–7.5)
RBC UA: NEGATIVE
Specific Gravity, UA: 1.02 (ref 1.005–1.030)
Urobilinogen, Ur: 0.2 mg/dL (ref 0.2–1.0)

## 2018-09-01 LAB — MICROSCOPIC EXAMINATION
EPITHELIAL CELLS (NON RENAL): NONE SEEN /HPF (ref 0–10)
RBC MICROSCOPIC, UA: NONE SEEN /HPF (ref 0–2)
WBC, UA: NONE SEEN /hpf (ref 0–5)

## 2018-09-01 LAB — POCT INR: INR: 2.4 (ref 2.0–3.0)

## 2018-09-01 MED ORDER — LIDOCAINE HCL URETHRAL/MUCOSAL 2 % EX GEL
1.0000 "application " | Freq: Once | CUTANEOUS | Status: AC
  Administered 2018-09-01: 1 via URETHRAL

## 2018-09-01 NOTE — Patient Instructions (Signed)
Please take 2 tablets tonight, then START NEW DOSAGE of 1.5 tablets every day except 1 on MONDAYS, [place]. Recheck in 1 week.

## 2018-09-01 NOTE — Progress Notes (Signed)
   09/01/18  CC:  Chief Complaint  Patient presents with  . Cysto    HPI: History CIS bladder 2002.  Received induction BCG and no recurrences.  Since his last visit he underwent open heart surgery with mitral/aortic valve replacements and ventricular myomectomy.  Blood pressure 134/74, pulse 82, height 5\' 9"  (1.753 m), weight 181 lb 4.8 oz (82.2 kg). NED. A&Ox3.     Cystoscopy Procedure Note  Patient identification was confirmed, informed consent was obtained, and patient was prepped using Betadine solution.  Lidocaine jelly was administered per urethral meatus.     Pre-Procedure: - Inspection reveals a normal caliber ureteral meatus.  Procedure: The flexible cystoscope was introduced without difficulty - No urethral strictures/lesions are present. - Enlarged prostate, moderate lateral lobes - Normal bladder neck - Bilateral ureteral orifices identified - Bladder mucosa  reveals no ulcers, tumors, or lesions - No bladder stones -Mild to moderate trabeculation  Retroflexion shows no abnormalities   Post-Procedure: - Patient tolerated the procedure well  Assessment/ Plan: No evidence of recurrent disease.  Continue annual surveillance.  Return in about 1 year (around 09/02/2019) for Cystoscopy.   Abbie Sons, MD

## 2018-09-04 ENCOUNTER — Encounter: Payer: Self-pay | Admitting: Internal Medicine

## 2018-09-04 ENCOUNTER — Ambulatory Visit: Payer: PPO | Admitting: Internal Medicine

## 2018-09-04 VITALS — BP 130/60 | HR 86 | Ht 69.0 in | Wt 181.0 lb

## 2018-09-04 DIAGNOSIS — Z950 Presence of cardiac pacemaker: Secondary | ICD-10-CM

## 2018-09-04 DIAGNOSIS — I442 Atrioventricular block, complete: Secondary | ICD-10-CM

## 2018-09-04 MED ORDER — ASPIRIN EC 81 MG PO TBEC: 81.0000 mg | DELAYED_RELEASE_TABLET | Freq: Every day | ORAL | Status: DC

## 2018-09-04 NOTE — Progress Notes (Signed)
ELECTROPHYSIOLOGY CONSULT NOTE  Patient ID: [name] [name], MRN: [id number], DOB/AGE: 1934-07-07 84 y.o. Admit date: (Not on file) Date of Consult: 09/04/2018  Primary Physician: [name], MD Primary Cardiologist: [name] [name] is a 84 y.o. male who is being seen today for the evaluation of pacemaker at the request of [name].    HPI [name] [name] is a 84 y.o. male seen to establish pacemaker follow-up.  He has a history of symptomatic hypertrophic obstructive cardiomyopathy in the context of permanent atrial fibrillation  He was referred to Dr. Jacelyn Grip at [facility] confirming severe septal hypertrophy.  He also noted severe left atrial enlargement, aortic stenosis and severe MR.  He underwent septal myectomy with mechanical AVR and MVR 9/19.  Postoperative course was complicated by complete heart block and implantation of a single-chamber [employer].  Post operative course was prolonged.  Ultimately discharged to rehab were course was complicated by GI bleed in the setting of a supratherapeutic INR.  Endoscopy demonstrated duodenal ulcer.  He has been much better.  Prior to surgery he could not walk to his mailbox.  Now he is walking around [place].  His wife's concern is fatigue.  No interval syncope.  They have been having trouble managing his INR; target 2.5-3.5.  Currently not on aspirin.  Records and Results Reviewed [facility] office notes and procedure reports    Past Medical History:  Diagnosis Date  . Atherosclerosis of aorta ([place])    by CT scan in past  . Atrial fibrillation ([place])    and aflutter. pt has a left atrial circuit that is not ablated. was on amiodarone-stopped, now use rate control.   . Bladder cancer ([place])    hx; treated with BCG in past   . Carotid artery disease ([place])    There was calcified plaque but no stenosis by carotid artery screening done at [facility] October, 2013  . CHB (complete heart  block) ([place])    a. 05/2018 s/p BSX VVIR PPM ([facility]) in setting of bradycardia following myomectomy and AVR/MVR.  . Diabetes mellitus    type not specified  . Diabetic neuropathy (HCC)    feet  . Elevated liver enzymes    over time; hx  . Excessive sweating   . Glaucoma   . Gout   . Head injury    when slipped on ice 2004-2005. stabilized and back on Coumadin   . Headache    migraines - distant past  . History of cardiac cath    a. 05/2018 Cath ([facility]): Non-obstructive CAD. Mildly elevated filling pressures w/ nl CI.  [name] HOCM (hypertrophic obstructive cardiomyopathy) ([place])    a. 01/2016 Echo: EF 65-70%, no rwma, LVOT gradient of 80-67mmHg, mod AS, SAM; b. 11/2017 Echo: EF 55-60%, near cavity obliteration in systole, mod AS, mild MS; c. 04/2018 Card MRI ([facility]): Sev LVH, EF >70%, Sev AS, LVOT obs w/ Sev MR, mild to mod TR/PR, mid-myocardial HE in basal-mid anteroseptum and inferoseptum; d. 05/2018 s/p Septal myomectomy; e. 05/2018 Echo: Nl EF w/ sev LVH. Mech AoV and MV.  . Homocystinemia ([place])    elevated, mild   . Hypercholesterolemia    treated.   . Hypertension   . Kidney mass    laproscopic surgery woth cryoablation of a mass outside kidney followed at [facility].   . Moderate aortic stenosis    a. 01/2016 echo: mod AS.   [name] Motion sickness  ocean boats  . Nausea   . Obstructive sleep apnea    CPAP started successfully 2014  . Orthostatic hypotension    a. orthostatic. dehydration. hospitalized 11/11  . Sleep apnea    Significant obstructive sleep apnea diagnosed in August, 2012, the patient is to receive CPAP   . Stroke [facility])    "2 old strokes" CT and MRI. [place] [facility] 11/11. diagnosis was dehydration, no acutal reports.   . TSH elevation    on synthroid historically   . Unsteady gait    August, 2012      Surgical History:  Past Surgical History:  Procedure Laterality Date  . BLADDER SURGERY    . CATARACT EXTRACTION W/PHACO Left 05/11/2015   Procedure: CATARACT EXTRACTION  PHACO AND INTRAOCULAR LENS PLACEMENT (IOC);  Surgeon: Leandrew Koyanagi, MD;  Location: [place];  Service: Ophthalmology;  Laterality: Left;  DIABETIC - oral meds, CPAP  . ESOPHAGOGASTRODUODENOSCOPY (EGD) WITH PROPOFOL N/A 07/01/2018   Procedure: ESOPHAGOGASTRODUODENOSCOPY (EGD) WITH PROPOFOL;  Surgeon: [name], MD;  Location: [facility] ENDOSCOPY;  Service: Endoscopy;  Laterality: N/A;  . INSERT / REPLACE / REMOVE PACEMAKER     Chemical engineer   . KIDNEY SURGERY     "froze mass"  . MECHANICAL AORTIC AND MITRAL VALVE REPLACEMENT  05/2018   [facility]   . TONSILLECTOMY    . VALVE REPLACEMENT       Home Meds: Current Meds  Medication Sig  . acetaminophen (TYLENOL) 325 MG tablet Take 650 mg by mouth every 6 (six) hours as needed.  [name] glucose blood (ONE TOUCH ULTRA TEST) test strip USE ONE STRIP TO CHECK GLUCOSE ONCE DAILY  . latanoprost (XALATAN) 0.005 % ophthalmic solution Place 1 drop into both eyes at bedtime.   [name] levothyroxine (SYNTHROID, LEVOTHROID) 50 MCG tablet TAKE 1 TABLET BY MOUTH ONCE DAILY  . metFORMIN (GLUCOPHAGE) 1000 MG tablet Take 1 tablet (1,000 mg total) by mouth daily with breakfast.  . metoprolol tartrate (LOPRESSOR) 25 MG tablet Take 12.5 mg by mouth 2 (two) times daily.  . NON FORMULARY Metoprolol 50 mg qd.  . ONE TOUCH ULTRA TEST test strip USE TO TEST BLOOD SUGAR ONCE DAILY  . ONETOUCH DELICA LANCETS 51W MISC USE ONE LANCET TO CHECK BLOOD GLUCOSE ONCE DAILY  . ONETOUCH DELICA LANCETS 25E MISC USE TO TEST BLOOD SUGAR ONCE DAILY  . simvastatin (ZOCOR) 40 MG tablet TAKE 1/2 (ONE-HALF) TABLET BY MOUTH AT BEDTIME  . warfarin (COUMADIN) 3 MG tablet Take 1 tablet (3 mg total) by mouth daily at 6 PM.    Allergies:  Allergies  Allergen Reactions  . Macrolides And Ketolides Other (See Comments)  . Mycinettes   . Nitrofuran Derivatives Other (See Comments)  . Nitrofurantoin Other (See Comments)  . Erythromycin Itching and Rash    Other reaction(s): UNKNOWN  And  red skin    Social History   Socioeconomic History  . Marital status: Married    Spouse name: Jasmine Awe  . Number of children: 1  . Years of education: 56  . Highest education level: Bachelor's degree (e.g., BA, AB, BS)  Occupational History  . Occupation: retired  Scientific laboratory technician  . Financial resource strain: Not hard at all  . Food insecurity:    Worry: Never true    Inability: Never true  . Transportation needs:    Medical: No    Non-medical: No  Tobacco Use  . Smoking status: Former Smoker    Types: Cigars, Cigarettes  Last attempt to quit: 10/02/1974    Years since quitting: 43.9  . Smokeless tobacco: Never Used  . Tobacco comment: still smokes cigars once a day  Substance and Sexual Activity  . Alcohol use: No    Frequency: Never  . Drug use: No  . Sexual activity: Yes  Lifestyle  . Physical activity:    Days per week: 0 days    Minutes per session: 0 min  . Stress: Not at all  Relationships  . Social connections:    Talks on phone: More than three times a week    Gets together: More than three times a week    Attends religious service: More than 4 times per year    Active member of club or organization: Yes    Attends meetings of clubs or organizations: More than 4 times per year    Relationship status: Married  . Intimate partner violence:    Fear of current or ex partner: No    Emotionally abused: No    Physically abused: No    Forced sexual activity: No  Other Topics Concern  . Not on file  Social History Narrative   Married, retired, gets regular exercise.      Family History  Problem Relation Age of Onset  . Arrhythmia Father        A-Fib  . Prostate cancer Father   . Stroke Father   . Breast cancer Mother   . Arrhythmia Brother        A-Fib  . Heart attack Neg Hx   . Hypertension Neg Hx      ROS:  Please see the history of present illness.     All other systems reviewed and negative.    Physical Exam:  Blood pressure 130/60, pulse  86, height 5\' 9"  (1.753 m), weight 181 lb (82.1 kg). General: Well developed, well nourished male in no acute distress. Head: Normocephalic, atraumatic, sclera non-icteric, no xanthomas, nares are without discharge. EENT: normal  Lymph Nodes:  none Neck: Negative for carotid bruits. JVD not elevated. Back:without scoliosis kyphosis  Lungs: Clear bilaterally to auscultation without wheezes, rales, or rhonchi. Breathing is unlabored. Device pocket well healed; without hematoma or erythema.  There is no tethering  Heart: RRR with mechanical S1 S2.  2/6 systolic murmur . No rubs, or gallops appreciated. Abdomen: Soft, non-tender, non-distended with normoactive bowel sounds. No hepatomegaly. No rebound/guarding. No obvious abdominal masses. Msk:  Strength and tone appear normal for age. Extremities: No clubbing or cyanosis. No edema.  Distal pedal pulses are 2+ and equal bilaterally. Skin: Warm and Dry Neuro: Alert and oriented X 3. CN III-XII intact Grossly normal sensory and motor function . Psych:  Responds to questions appropriately with a normal affect.      Labs: Cardiac Enzymes No results for input(s): CKTOTAL, CKMB, TROPONINI in the last 72 hours. CBC Lab Results  Component Value Date   WBC 7.7 08/11/2018   HGB 11.0 (L) 08/11/2018   HCT 34.6 (L) 08/11/2018   MCV 94 08/11/2018   PLT 286 08/11/2018   PROTIME: Recent Labs    09/01/18 1328  INR 2.4   Chemistry No results for input(s): NA, K, CL, CO2, BUN, CREATININE, CALCIUM, PROT, BILITOT, ALKPHOS, ALT, AST, GLUCOSE in the last 168 hours.  Invalid input(s): LABALBU Lipids Lab Results  Component Value Date   CHOL 147 11/12/2017   HDL 41 11/12/2017   LDLCALC 82 11/12/2017   TRIG 120 11/12/2017   BNP No  results found for: PROBNP Thyroid Function Tests: No results for input(s): TSH, T4TOTAL, T3FREE, THYROIDAB in the last 72 hours.  Invalid input(s): FREET3 Miscellaneous No results found for:  DDIMER  Radiology/Studies:  No results found.  EKG: Atrial fibrillation with ventricular pacing and frequent ventricular ectopy  Pacemaker interrogated.  Parameters normal.    Assessment and Plan:  Complete heart block  Pacemaker-[employer]-single-chamber  AVR/MVR-mechanical  Hypertrophic obstructive cardiomyopathy status post septal myectomy  Atrial fibrillation-permanent  Congestive heart failure-chronic-diastolic   Device function is normal.  No underlying rhythm today.  With mechanical valves and atrial fibrillation, have recommended the adjunctive use of baby aspirin with his Coumadin.  Have discussed this with Dr. Gaylyn Cheers who was in agreement.  Lengthy discussions with the patient's wife regarding the underlying conditions of aortic stenosis, mitral regurgitation, hypertrophic cardiomyopathy that persists not withstanding the relief of the outflow obstruction  She would like pacemaker follow-up in [place].  We will arrange for transfer of his [place].   Virl Axe

## 2018-09-04 NOTE — Patient Instructions (Signed)
Medication Instructions:  Your physician has recommended you make the following change in your medication:   1) START aspirin 81 mg- take 1 tablet by mouth once daily  If you need a refill on your cardiac medications before your next appointment, please call your pharmacy.   Lab work: - none ordered  If you have labs (blood work) drawn today and your tests are completely normal, you will receive your results only by: [name] MyChart Message (if you have MyChart) OR . A paper copy in the mail If you have any lab test that is abnormal or we need to change your treatment, we will call you to review the results.  Testing/Procedures: - none ordered  Follow-Up: At [facility], you and your health needs are our priority.  As part of our continuing mission to provide you with exceptional heart care, we have created designated Provider Care Teams.  These Care Teams include your primary Cardiologist (physician) and Advanced Practice Providers (APPs -  Physician Assistants and Nurse Practitioners) who all work together to provide you with the care you need, when you need it. You will need a follow up appointment in 9 months with Dr. Caryl Comes.  Please call our office 2 months in advance to schedule this appointment.     - we will have all of your information for your remote transmissions forwarded to our clinic   Any Other Special Instructions Will Be Listed Below (If Applicable). N/A

## 2018-09-08 ENCOUNTER — Telehealth: Payer: Self-pay | Admitting: Internal Medicine

## 2018-09-08 ENCOUNTER — Telehealth: Payer: Self-pay | Admitting: Cardiovascular Disease

## 2018-09-08 ENCOUNTER — Ambulatory Visit: Payer: PPO

## 2018-09-08 DIAGNOSIS — Z5181 Encounter for therapeutic drug level monitoring: Secondary | ICD-10-CM

## 2018-09-08 DIAGNOSIS — I482 Chronic atrial fibrillation, unspecified: Secondary | ICD-10-CM | POA: Diagnosis not present

## 2018-09-08 DIAGNOSIS — I4891 Unspecified atrial fibrillation: Secondary | ICD-10-CM | POA: Diagnosis not present

## 2018-09-08 DIAGNOSIS — I4892 Unspecified atrial flutter: Secondary | ICD-10-CM

## 2018-09-08 LAB — POCT INR: INR: 2.2 (ref 2.0–3.0)

## 2018-09-08 NOTE — Patient Instructions (Signed)
Please take 2 tablets tonight, then START NEW DOSAGE of 1.5 tablets every day. Recheck in 1 week.

## 2018-09-08 NOTE — Telephone Encounter (Signed)
Pt came in today for INR check.  His wife states that during last ov w/ Dr. Fletcher Anon, it was mentioned that pt would benefit from [facility], but pt never received a call to set this up. She would like to know if this is something pt can do w/ a complete heart block, and if so, how she would go about getting him set up.  Please call pt's wife @ [phone].

## 2018-09-08 NOTE — Telephone Encounter (Signed)
Will review with Dr. Caryl Comes on 11/26 when he is back in the office prior to calling the patient/ his wife back.

## 2018-09-08 NOTE — Telephone Encounter (Signed)
Pt came in to the office for INR check. Pt was started on Lopressor 25 mg - 1/2 tab BID on 08/19/18 for afib & frequent PVCs. His wife reports that pt has been c/o tiredness and Dr. Caryl Comes mentioned that metoprolol may be the cause. This was not adjusted at pt's last ov, so his wife decided to reduce pt's dosage to once daily in am.  She would like to know if this is the right thing to do and/or if pt's dose should be further adjusted or d/c'd altogether.   Pt was advised at last ov that he has a "complete heart block and his heart is not beating on it's own, that it is solely dependent on the pacemaker", but this was not explained to her. Pt's wife states that she read online that metoprolol can cause a heart block and she would like to know if this is something he should be on.   Wife is also concerned about the life of pt's pacer battery, as he is not scheduled for f/u until next month; if his heart is dependent on the pacer, will that run the battery down faster?  Please call to discuss w/ pt's wife on her call @ [phone].

## 2018-09-09 ENCOUNTER — Telehealth: Payer: Self-pay | Admitting: *Deleted

## 2018-09-09 DIAGNOSIS — G4733 Obstructive sleep apnea (adult) (pediatric): Secondary | ICD-10-CM | POA: Diagnosis not present

## 2018-09-09 NOTE — Telephone Encounter (Signed)
Patient returning call did not understand vm .

## 2018-09-09 NOTE — Telephone Encounter (Signed)
Per Dr. Caryl Comes, he did try to call the patient's wife back. They spoke and they were both agreeable with the patient returning to [facility] for follow up of his device.

## 2018-09-09 NOTE — Telephone Encounter (Signed)
Yes they were faxed. TNP

## 2018-09-09 NOTE — Telephone Encounter (Signed)
[name] [name] with [facility] called and she is sending a message to the nurse to pull that patient back into their latitude nxt.

## 2018-09-09 NOTE — Telephone Encounter (Signed)
Called and left a voicemail on his wife's machine.  She had many questions and anxieties related to her husband being device dependent something of which she was unaware.  Having tried to review these with her in the office, my reading of the nurse's notes are that I was insufficiently able to allay her concern.  I suggested that they keep their follow-up with the [facility] device clinic scheduled for December and if thereafter they would like to return for device follow-up here in [place] I would be glad to help in any way that I can

## 2018-09-09 NOTE — Telephone Encounter (Addendum)
LM for [facility] requesting call back to our DC, gave direct number. Will request that they pull patient back into their system.   Patient had been transferred into our Latitude system this morning due to establishing care with Dr. Caryl Comes on 09/04/18. Per phone note from 09/09/18, patient is going to keep f/u at [facility] for now.

## 2018-09-10 DIAGNOSIS — G4733 Obstructive sleep apnea (adult) (pediatric): Secondary | ICD-10-CM | POA: Diagnosis not present

## 2018-09-10 NOTE — Telephone Encounter (Signed)
Call placed to the patient. He requested that I speak with his wife.  She wanted to know when the patient would start cardiac rehab. She has been advised that we will check on this.   She also wanted to inquire about the patient's metoprolol. He is currently prescribed Metoprolol 12.5 bid. She stated that at his last office visit with Dr. Caryl Comes she mentioned that the patient was getting tired, more so towards the end of the day. She stated that it was her understanding that he may stop the Metoprolol. She has been advised that this was not noted in the office visit note. She has reduced his Metoprolol to 12.5 mg daily. She does not currently check his blood pressure or heart rate.   She would like Dr. Tyrell Antonio opinion on stopping the Metoprolol.   She has been instructed to check his blood pressure and heart rate twice daily, one being 2 hours after his dose of Metoprolol. She will keep a log of this and call back on Monday.

## 2018-09-10 NOTE — Telephone Encounter (Signed)
Patient successfully transferred in Latitude NXT back to [facility].

## 2018-09-11 NOTE — Telephone Encounter (Signed)
Given that he has PVCs, he should stay on small dose metoprolol 12.5 mg twice daily.  We might be able to stop this down the road.

## 2018-09-15 ENCOUNTER — Ambulatory Visit (INDEPENDENT_AMBULATORY_CARE_PROVIDER_SITE_OTHER): Payer: PPO | Admitting: Family Medicine

## 2018-09-15 ENCOUNTER — Encounter: Payer: Self-pay | Admitting: Family Medicine

## 2018-09-15 ENCOUNTER — Ambulatory Visit: Payer: PPO

## 2018-09-15 VITALS — BP 122/68 | HR 80 | Temp 98.5°F | Resp 16 | Ht 69.0 in | Wt 179.0 lb

## 2018-09-15 DIAGNOSIS — I10 Essential (primary) hypertension: Secondary | ICD-10-CM

## 2018-09-15 DIAGNOSIS — I4891 Unspecified atrial fibrillation: Secondary | ICD-10-CM

## 2018-09-15 DIAGNOSIS — I482 Chronic atrial fibrillation, unspecified: Secondary | ICD-10-CM

## 2018-09-15 DIAGNOSIS — Z5181 Encounter for therapeutic drug level monitoring: Secondary | ICD-10-CM | POA: Diagnosis not present

## 2018-09-15 DIAGNOSIS — Z950 Presence of cardiac pacemaker: Secondary | ICD-10-CM | POA: Diagnosis not present

## 2018-09-15 DIAGNOSIS — I080 Rheumatic disorders of both mitral and aortic valves: Secondary | ICD-10-CM | POA: Diagnosis not present

## 2018-09-15 DIAGNOSIS — I421 Obstructive hypertrophic cardiomyopathy: Secondary | ICD-10-CM | POA: Diagnosis not present

## 2018-09-15 DIAGNOSIS — K279 Peptic ulcer, site unspecified, unspecified as acute or chronic, without hemorrhage or perforation: Secondary | ICD-10-CM

## 2018-09-15 DIAGNOSIS — I4892 Unspecified atrial flutter: Secondary | ICD-10-CM

## 2018-09-15 DIAGNOSIS — Z952 Presence of prosthetic heart valve: Secondary | ICD-10-CM

## 2018-09-15 LAB — POCT INR: INR: 2.5 (ref 2.0–3.0)

## 2018-09-15 MED ORDER — PANTOPRAZOLE SODIUM 40 MG PO TBEC: 40.0000 mg | DELAYED_RELEASE_TABLET | Freq: Two times a day (BID) | ORAL | 3 refills | Status: DC

## 2018-09-15 NOTE — Patient Instructions (Signed)
Please START NEW DOSAGE of 1.5 tablets every day EXCEPT 2 TABLETS ON MONDAYS & FRIDAYS. Recheck in 2 weeks. Please call [phone] if you have a GI procedure and need to hold coumadin.

## 2018-09-15 NOTE — Progress Notes (Signed)
Patient: [name] [name] Male    DOB: November 06, 1933   84 y.o.   MRN: [id number] Visit Date: 09/15/2018  Today's Provider: [name], MD   Chief Complaint  Patient presents with  . Follow-up   Subjective:    HPI  Patient comes in today for a follow up. He was last seen in the office 1 month ago. At the time of visit, patient was c/o pain in his left shoulder. He reports that he is still having pain, but it is not as bad as before.  Patient also mentions that he has a burning sensation in his upper belly that has been constant in the last 2-3 weeks. He reports that his symptoms are not in relation to foods. He does have history of GI bleed. He is not on any PPIs or take anything for his symptoms.  Patient overall is doing well very well but his wife is very agitated and angry about his health challenges.    Allergies  Allergen Reactions  . Macrolides And Ketolides Other (See Comments)  . Mycinettes   . Nitrofuran Derivatives Other (See Comments)  . Nitrofurantoin Other (See Comments)  . Erythromycin Itching and Rash    Other reaction(s): UNKNOWN  And red skin     Current Outpatient Medications:  .  acetaminophen (TYLENOL) 325 MG tablet, Take 650 mg by mouth every 6 (six) hours as needed., Disp: , Rfl:  .  aspirin EC 81 MG tablet, Take 1 tablet (81 mg total) by mouth daily., Disp: , Rfl:  .  glucose blood (ONE TOUCH ULTRA TEST) test strip, USE ONE STRIP TO CHECK GLUCOSE ONCE DAILY, Disp: 100 each, Rfl: 12 .  latanoprost (XALATAN) 0.005 % ophthalmic solution, Place 1 drop into both eyes at bedtime. , Disp: , Rfl:  .  levothyroxine (SYNTHROID, LEVOTHROID) 50 MCG tablet, TAKE 1 TABLET BY MOUTH ONCE DAILY, Disp: 90 tablet, Rfl: 3 .  metFORMIN (GLUCOPHAGE) 1000 MG tablet, Take 1 tablet (1,000 mg total) by mouth daily with breakfast., Disp: 90 tablet, Rfl: 3 .  metoprolol tartrate (LOPRESSOR) 25 MG tablet, Take 12.5 mg by mouth 2 (two) times daily., Disp: , Rfl:  .  NON  FORMULARY, Metoprolol 50 mg qd., Disp: , Rfl:  .  ONE TOUCH ULTRA TEST test strip, USE TO TEST BLOOD SUGAR ONCE DAILY, Disp: 50 each, Rfl: 25 .  ONETOUCH DELICA LANCETS 00B MISC, USE ONE LANCET TO CHECK BLOOD GLUCOSE ONCE DAILY, Disp: 100 each, Rfl: 12 .  ONETOUCH DELICA LANCETS 70W MISC, USE TO TEST BLOOD SUGAR ONCE DAILY, Disp: 100 each, Rfl: 12 .  simvastatin (ZOCOR) 40 MG tablet, TAKE 1/2 (ONE-HALF) TABLET BY MOUTH AT BEDTIME, Disp: 45 tablet, Rfl: 3 .  warfarin (COUMADIN) 3 MG tablet, Take 1 tablet (3 mg total) by mouth daily at 6 PM., Disp: 30 tablet, Rfl: 2  Review of Systems  Constitutional: Positive for activity change and fatigue.  Respiratory: Negative for cough and shortness of breath.   Cardiovascular: Negative for chest pain, palpitations and leg swelling.  Gastrointestinal: Positive for abdominal pain and nausea. Negative for anal bleeding, blood in stool, constipation, diarrhea, rectal pain and vomiting.  Endocrine: Negative for cold intolerance, heat intolerance, polydipsia, polyphagia and polyuria.  Genitourinary: Negative.   Musculoskeletal: Positive for arthralgias.  Neurological: Negative for dizziness, weakness, light-headedness and headaches.  Psychiatric/Behavioral: Negative for agitation, self-injury, sleep disturbance and suicidal ideas. The patient is not nervous/anxious.     Social History  Tobacco Use  . Smoking status: Former Smoker    Types: Cigars, Cigarettes    Last attempt to quit: 10/02/1974    Years since quitting: 43.9  . Smokeless tobacco: Never Used  . Tobacco comment: still smokes cigars once a day  Substance Use Topics  . Alcohol use: No    Frequency: Never   Objective:   BP 122/68 (BP Location: Right Arm, Patient Position: Sitting, Cuff Size: Normal)   Pulse 80   Temp 98.5 F (36.9 C)   Resp 16   Ht 5\' 9"  (1.753 m)   Wt 179 lb (81.2 kg)   SpO2 96%   BMI 26.43 kg/m  Vitals:   09/15/18 1045  BP: 122/68  Pulse: 80  Resp: 16    Temp: 98.5 F (36.9 C)  SpO2: 96%  Weight: 179 lb (81.2 kg)  Height: 5\' 9"  (1.753 m)     Physical Exam  Constitutional: He is oriented to person, place, and time. He appears well-developed and well-nourished.  HENT:  Head: Normocephalic and atraumatic.  Right Ear: External ear normal.  Left Ear: External ear normal.  Nose: Nose normal.  Eyes: Conjunctivae are normal. No scleral icterus.  Neck: No JVD present.  Cardiovascular: Normal rate and regular rhythm.  Pulmonary/Chest: Effort normal and breath sounds normal.  Abdominal: Soft. There is no tenderness.  Neurological: He is alert and oriented to person, place, and time.  Skin: Skin is warm and dry.  Psychiatric: He has a normal mood and affect. His behavior is normal. Judgment and thought content normal.        Assessment & Plan:     1. Essential (primary) hypertension   2. PUD (peptic ulcer disease) Twice a day pantoprazole for now.  Return to clinic 2 to 4 weeks.  Refer back to GI also if symptoms persist. - CBC with Differential/Platelet - Iron - pantoprazole (PROTONIX) 40 MG tablet; Take 1 tablet (40 mg total) by mouth 2 (two) times daily.  Dispense: 60 tablet; Refill: 3. HOCM (hypertrophic obstructive cardiomyopathy) ([place]) Patient status post myomectomy.  He still has obstructive cardiomyopathy  4. Atrial fibrillation and flutter ([place])   5. Mitral and aortic incompetence Patient is status post replacement of both valves.       I have done the exam and reviewed the above chart and it is accurate to the best of my knowledge. Development worker, community has been used in this note in any air is in the dictation or transcription are unintentional.  [name], MD  [place]

## 2018-09-15 NOTE — Telephone Encounter (Signed)
The patient and his wife came in for a coumadin appointment. The wife has been advised that the patient should be taking the Metoprolol 12.5 bid. She has verbalized her understanding.   She also inquired about the cardiac rehab referral.  She has been informed that we will be looking into this and call her back when we know something.

## 2018-09-16 ENCOUNTER — Telehealth: Payer: Self-pay

## 2018-09-16 LAB — CBC WITH DIFFERENTIAL/PLATELET
BASOS ABS: 0.1 10*3/uL (ref 0.0–0.2)
Basos: 1 %
EOS (ABSOLUTE): 0.5 10*3/uL — ABNORMAL HIGH (ref 0.0–0.4)
Eos: 5 %
HEMOGLOBIN: 12.8 g/dL — AB (ref 13.0–17.7)
Hematocrit: 38.4 % (ref 37.5–51.0)
Immature Grans (Abs): 0 10*3/uL (ref 0.0–0.1)
Immature Granulocytes: 0 %
LYMPHS ABS: 2.4 10*3/uL (ref 0.7–3.1)
Lymphs: 28 %
MCH: 29.7 pg (ref 26.6–33.0)
MCHC: 33.3 g/dL (ref 31.5–35.7)
MCV: 89 fL (ref 79–97)
MONOS ABS: 0.9 10*3/uL (ref 0.1–0.9)
Monocytes: 11 %
NEUTROS ABS: 4.7 10*3/uL (ref 1.4–7.0)
Neutrophils: 55 %
Platelets: 249 10*3/uL (ref 150–450)
RBC: 4.31 x10E6/uL (ref 4.14–5.80)
RDW: 13.4 % (ref 12.3–15.4)
WBC: 8.6 10*3/uL (ref 3.4–10.8)

## 2018-09-16 LAB — IRON: IRON: 68 ug/dL (ref 38–169)

## 2018-09-16 NOTE — Telephone Encounter (Signed)
-----   Message from [name], MD sent at 09/16/2018 10:57 AM EST ----- CBC/Iron ok.

## 2018-09-16 NOTE — Telephone Encounter (Signed)
Unable to reach patient at this time, will try at a later time. KW

## 2018-09-22 ENCOUNTER — Encounter: Payer: Self-pay | Admitting: *Deleted

## 2018-09-22 ENCOUNTER — Encounter: Payer: PPO | Attending: Cardiovascular Disease | Admitting: *Deleted

## 2018-09-22 VITALS — Ht 68.4 in | Wt 186.9 lb

## 2018-09-22 DIAGNOSIS — M109 Gout, unspecified: Secondary | ICD-10-CM | POA: Insufficient documentation

## 2018-09-22 DIAGNOSIS — Z7901 Long term (current) use of anticoagulants: Secondary | ICD-10-CM | POA: Diagnosis not present

## 2018-09-22 DIAGNOSIS — Z7982 Long term (current) use of aspirin: Secondary | ICD-10-CM | POA: Insufficient documentation

## 2018-09-22 DIAGNOSIS — Z79899 Other long term (current) drug therapy: Secondary | ICD-10-CM | POA: Insufficient documentation

## 2018-09-22 DIAGNOSIS — Z952 Presence of prosthetic heart valve: Secondary | ICD-10-CM | POA: Insufficient documentation

## 2018-09-22 DIAGNOSIS — I700 Atherosclerosis of aorta: Secondary | ICD-10-CM | POA: Insufficient documentation

## 2018-09-22 DIAGNOSIS — Z79890 Hormone replacement therapy (postmenopausal): Secondary | ICD-10-CM | POA: Diagnosis not present

## 2018-09-22 DIAGNOSIS — E1142 Type 2 diabetes mellitus with diabetic polyneuropathy: Secondary | ICD-10-CM | POA: Insufficient documentation

## 2018-09-22 DIAGNOSIS — Z8551 Personal history of malignant neoplasm of bladder: Secondary | ICD-10-CM | POA: Diagnosis not present

## 2018-09-22 DIAGNOSIS — Z87891 Personal history of nicotine dependence: Secondary | ICD-10-CM | POA: Diagnosis not present

## 2018-09-22 DIAGNOSIS — I10 Essential (primary) hypertension: Secondary | ICD-10-CM | POA: Insufficient documentation

## 2018-09-22 DIAGNOSIS — G4736 Sleep related hypoventilation in conditions classified elsewhere: Secondary | ICD-10-CM | POA: Diagnosis not present

## 2018-09-22 NOTE — Patient Instructions (Signed)
Patient Instructions  Patient Details  Name: [name] [name] MRN: [id number] Date of Birth: 1934-06-20 Referring Provider:  Wellington Hampshire, MD  Below are your personal goals for exercise, nutrition, and risk factors. Our goal is to help you stay on track towards obtaining and maintaining these goals. We will be discussing your progress on these goals with you throughout the program.  Initial Exercise Prescription: Initial Exercise Prescription - 09/22/18 1400      Date of Initial Exercise RX and Referring Provider   Date  09/22/18    Referring Provider  Kathlyn Sacramento MD      Treadmill   MPH  1.3    Grade  0.5    Minutes  15    METs  2.09      NuStep   Level  1    SPM  80    Minutes  15    METs  2      Recumbant Elliptical   Level  1    RPM  50    Minutes  15    METs  2      Prescription Details   Frequency (times per week)  3    Duration  Progress to 45 minutes of aerobic exercise without signs/symptoms of physical distress      Intensity   THRR 40-80% of Max Heartrate  103-126    Ratings of Perceived Exertion  11-13    Perceived Dyspnea  0-4      Progression   Progression  Continue to progress workloads to maintain intensity without signs/symptoms of physical distress.      Resistance Training   Training Prescription  Yes    Weight  3 lbs    Reps  10-15       Exercise Goals: Frequency: Be able to perform aerobic exercise two to three times per week in program working toward 2-5 days per week of home exercise.  Intensity: Work with a perceived exertion of 11 (fairly light) - 15 (hard) while following your exercise prescription.  We will make changes to your prescription with you as you progress through the program.   Duration: Be able to do 30 to 45 minutes of continuous aerobic exercise in addition to a 5 minute warm-up and a 5 minute cool-down routine.   Nutrition Goals: Your personal nutrition goals will be established when you do your nutrition  analysis with the dietician.  The following are general nutrition guidelines to follow: Cholesterol < 200mg /day Sodium < 1500mg /day Fiber: Men over 50 yrs - 30 grams per day  Personal Goals: Personal Goals and Risk Factors at Admission - 09/22/18 1305      Core Components/Risk Factors/Patient Goals on Admission    Weight Management  Yes;Weight Maintenance    Intervention  Weight Management: Develop a combined nutrition and exercise program designed to reach desired caloric intake, while maintaining appropriate intake of nutrient and fiber, sodium and fats, and appropriate energy expenditure required for the weight goal.;Weight Management: Provide education and appropriate resources to help participant work on and attain dietary goals.    Admit Weight  185 lb (83.9 kg)    Expected Outcomes  Short Term: Continue to assess and modify interventions until short term weight is achieved;Long Term: Adherence to nutrition and physical activity/exercise program aimed toward attainment of established weight goal;Weight Maintenance: Understanding of the daily nutrition guidelines, which includes 25-35% calories from fat, 7% or less cal from saturated fats, less than 200mg  cholesterol, less than 1.5gm  of sodium, & 5 or more servings of fruits and vegetables daily;Understanding recommendations for meals to include 15-35% energy as protein, 25-35% energy from fat, 35-60% energy from carbohydrates, less than 200mg  of dietary cholesterol, 20-35 gm of total fiber daily;Understanding of distribution of calorie intake throughout the day with the consumption of 4-5 meals/snacks    Diabetes  Yes    Intervention  Provide education about signs/symptoms and action to take for hypo/hyperglycemia.;Provide education about proper nutrition, including hydration, and aerobic/resistive exercise prescription along with prescribed medications to achieve blood glucose in normal ranges: Fasting glucose 65-99 mg/dL    Expected Outcomes   Short Term: Participant verbalizes understanding of the signs/symptoms and immediate care of hyper/hypoglycemia, proper foot care and importance of medication, aerobic/resistive exercise and nutrition plan for blood glucose control.;Long Term: Attainment of HbA1C < 7%.    Lipids  Yes    Intervention  Provide education and support for participant on nutrition & aerobic/resistive exercise along with prescribed medications to achieve LDL 70mg , HDL >40mg .    Expected Outcomes  Short Term: Participant states understanding of desired cholesterol values and is compliant with medications prescribed. Participant is following exercise prescription and nutrition guidelines.;Long Term: Cholesterol controlled with medications as prescribed, with individualized exercise RX and with personalized nutrition plan. Value goals: LDL < 70mg , HDL > 40 mg.       Tobacco Use Initial Evaluation: Social History   Tobacco Use  Smoking Status Former Smoker  . Types: Cigars, Cigarettes  . Last attempt to quit: 10/02/1974  . Years since quitting: 44.0  Smokeless Tobacco Never Used  Tobacco Comment   still smokes cigars once a day    Exercise Goals and Review: Exercise Goals    Row Name 09/22/18 1452             Exercise Goals   Increase Physical Activity  Yes       Intervention  Provide advice, education, support and counseling about physical activity/exercise needs.;Develop an individualized exercise prescription for aerobic and resistive training based on initial evaluation findings, risk stratification, comorbidities and participant's personal goals.       Expected Outcomes  Short Term: Attend rehab on a regular basis to increase amount of physical activity.;Long Term: Add in home exercise to make exercise part of routine and to increase amount of physical activity.;Long Term: Exercising regularly at least 3-5 days a week.       Increase Strength and Stamina  Yes       Intervention  Provide advice,  education, support and counseling about physical activity/exercise needs.;Develop an individualized exercise prescription for aerobic and resistive training based on initial evaluation findings, risk stratification, comorbidities and participant's personal goals.       Expected Outcomes  Short Term: Increase workloads from initial exercise prescription for resistance, speed, and METs.;Short Term: Perform resistance training exercises routinely during rehab and add in resistance training at home;Long Term: Improve cardiorespiratory fitness, muscular endurance and strength as measured by increased METs and functional capacity (6MWT)       Able to understand and use rate of perceived exertion (RPE) scale  Yes       Intervention  Provide education and explanation on how to use RPE scale       Expected Outcomes  Short Term: Able to use RPE daily in rehab to express subjective intensity level;Long Term:  Able to use RPE to guide intensity level when exercising independently       Able to understand and use  Dyspnea scale  Yes       Intervention  Provide education and explanation on how to use Dyspnea scale       Expected Outcomes  Short Term: Able to use Dyspnea scale daily in rehab to express subjective sense of shortness of breath during exertion;Long Term: Able to use Dyspnea scale to guide intensity level when exercising independently       Knowledge and understanding of Target Heart Rate Range (THRR)  Yes       Intervention  Provide education and explanation of THRR including how the numbers were predicted and where they are located for reference       Expected Outcomes  Short Term: Able to state/look up THRR;Short Term: Able to use daily as guideline for intensity in rehab;Long Term: Able to use THRR to govern intensity when exercising independently       Able to check pulse independently  Yes       Intervention  Provide education and demonstration on how to check pulse in carotid and radial  arteries.;Review the importance of being able to check your own pulse for safety during independent exercise       Expected Outcomes  Short Term: Able to explain why pulse checking is important during independent exercise;Long Term: Able to check pulse independently and accurately       Understanding of Exercise Prescription  Yes       Intervention  Provide education, explanation, and written materials on patient's individual exercise prescription       Expected Outcomes  Long Term: Able to explain home exercise prescription to exercise independently;Short Term: Able to explain program exercise prescription          Copy of goals given to participant.

## 2018-09-22 NOTE — Progress Notes (Signed)
Cardiac Individual Treatment Plan  Patient Details  Name: [name] [name] MRN: [id number] Date of Birth: 1934-08-08 Referring Provider:     Cardiac Rehab from 09/22/2018 in [facility] Cardiac and Pulmonary Rehab  Referring Provider  Kathlyn Sacramento MD      Initial Encounter Date:    Cardiac Rehab from 09/22/2018 in [facility] Cardiac and Pulmonary Rehab  Date  09/22/18      Visit Diagnosis: S/P aortic valve replacement  S/P mitral valve replacement  Patient's Home Medications on Admission:  Current Outpatient Medications:  .  acetaminophen (TYLENOL) 325 MG tablet, Take 650 mg by mouth every 6 (six) hours as needed., Disp: , Rfl:  .  aspirin EC 81 MG tablet, Take 1 tablet (81 mg total) by mouth daily., Disp: , Rfl:  .  glucose blood (ONE TOUCH ULTRA TEST) test strip, USE ONE STRIP TO CHECK GLUCOSE ONCE DAILY, Disp: 100 each, Rfl: 12 .  latanoprost (XALATAN) 0.005 % ophthalmic solution, Place 1 drop into both eyes at bedtime. , Disp: , Rfl:  .  levothyroxine (SYNTHROID, LEVOTHROID) 50 MCG tablet, TAKE 1 TABLET BY MOUTH ONCE DAILY, Disp: 90 tablet, Rfl: 3 .  metFORMIN (GLUCOPHAGE) 1000 MG tablet, Take 1 tablet (1,000 mg total) by mouth daily with breakfast., Disp: 90 tablet, Rfl: 3 .  metoprolol tartrate (LOPRESSOR) 25 MG tablet, Take 12.5 mg by mouth 2 (two) times daily., Disp: , Rfl:  .  ONE TOUCH ULTRA TEST test strip, USE TO TEST BLOOD SUGAR ONCE DAILY, Disp: 50 each, Rfl: 25 .  ONETOUCH DELICA LANCETS 41U MISC, USE ONE LANCET TO CHECK BLOOD GLUCOSE ONCE DAILY, Disp: 100 each, Rfl: 12 .  ONETOUCH DELICA LANCETS 38G MISC, USE TO TEST BLOOD SUGAR ONCE DAILY, Disp: 100 each, Rfl: 12 .  pantoprazole (PROTONIX) 40 MG tablet, Take 1 tablet (40 mg total) by mouth 2 (two) times daily., Disp: 60 tablet, Rfl: 3 .  simvastatin (ZOCOR) 40 MG tablet, TAKE 1/2 (ONE-HALF) TABLET BY MOUTH AT BEDTIME, Disp: 45 tablet, Rfl: 3 .  warfarin (COUMADIN) 3 MG tablet, Take 1 tablet (3 mg total) by mouth daily at 6  PM., Disp: 30 tablet, Rfl: 2 .  NON FORMULARY, Metoprolol 50 mg qd., Disp: , Rfl:   Past Medical History: Past Medical History:  Diagnosis Date  . Atherosclerosis of aorta ([place])    by CT scan in past  . Atrial fibrillation ([place])    and aflutter. pt has a left atrial circuit that is not ablated. was on amiodarone-stopped, now use rate control.   . Bladder cancer ([place])    hx; treated with BCG in past   . Carotid artery disease ([place])    There was calcified plaque but no stenosis by carotid artery screening done at [facility] October, 2013  . CHB (complete heart block) ([place])    a. 05/2018 s/p BSX VVIR PPM ([facility]) in setting of bradycardia following myomectomy and AVR/MVR.  . Diabetes mellitus    type not specified  . Diabetic neuropathy (HCC)    feet  . Elevated liver enzymes    over time; hx  . Excessive sweating   . Glaucoma   . Gout   . Head injury    when slipped on ice 2004-2005. stabilized and back on Coumadin   . Headache    migraines - distant past  . History of cardiac cath    a. 05/2018 Cath ([facility]): Non-obstructive CAD. Mildly elevated filling pressures w/ nl CI.  [name] HOCM (hypertrophic  obstructive cardiomyopathy) ([place])    a. 01/2016 Echo: EF 65-70%, no rwma, LVOT gradient of 80-49mHg, mod AS, SAM; b. 11/2017 Echo: EF 55-60%, near cavity obliteration in systole, mod AS, mild MS; c. 04/2018 Card MRI ([facility]): Sev LVH, EF >70%, Sev AS, LVOT obs w/ Sev MR, mild to mod TR/PR, mid-myocardial HE in basal-mid anteroseptum and inferoseptum; d. 05/2018 s/p Septal myomectomy; e. 05/2018 Echo: Nl EF w/ sev LVH. Mech AoV and MV.  . Homocystinemia ([place]    elevated, mild   . Hypercholesterolemia    treated.   . Hypertension   . Kidney mass    laproscopic surgery woth cryoablation of a [facility]   . Moderate aortic stenosis    a. 01/2016 echo: mod AS.   .Marland KitchenMotion sickness    ocean boats  . Nausea   . Obstructive sleep apnea    CPAP started  successfully 2014  . Orthostatic hypotension    a. orthostatic. dehydration. hospitalized 11/11  . Sleep apnea    Significant obstructive sleep apnea diagnosed in August, 2012, the patient is to receive CPAP   . Stroke ([facility]    "2 old strokes" CT and MRI. [place] [facility] 11/11. diagnosis was dehydration, no acutal reports.   . TSH elevation    on synthroid historically   . Unsteady gait    August, 2012    Tobacco Use: Social History   Tobacco Use  Smoking Status Former Smoker  . Types: Cigars, Cigarettes  . Last attempt to quit: 10/02/1974  . Years since quitting: 44.0  Smokeless Tobacco Never Used  Tobacco Comment   still smokes cigars once a day    Labs: Recent Review Flowsheet Data    Labs for ITP Cardiac and Pulmonary Rehab Latest Ref Rng & Units 12/10/2016 03/30/2017 08/05/2017 11/12/2017 07/10/2018   Cholestrol 100 - 199 mg/dL 168 137 - 147 -   LDLCALC 0 - 99 mg/dL 96 70 - 82 -   HDL >39 mg/dL 41 36 - 41 -   Trlycerides 0 - 149 mg/dL 155(H) 153 - 120 -   Hemoglobin A1c 4.8 - 5.6 % 6.6(H) 7.4 6.5 6.3(H) 5.5       Exercise Target Goals: Exercise Program Goal: Individual exercise prescription set using results from initial 6 min walk test and THRR while considering  patient's activity barriers and safety.   Exercise Prescription Goal: Initial exercise prescription builds to 30-45 minutes a day of aerobic activity, 2-3 days per week.  Home exercise guidelines will be given to patient during program as part of exercise prescription that the participant will acknowledge.  Activity Barriers & Risk Stratification: Activity Barriers & Cardiac Risk Stratification - 09/22/18 1341      Activity Barriers & Cardiac Risk Stratification   Activity Barriers  Decreased Ventricular Function;Arthritis;Deconditioning;Muscular Weakness   left knee pain from dislocation in college   Cardiac Risk Stratification  High       6 Minute Walk: 6 Minute Walk    Row Name 09/22/18 1352          6 Minute Walk   Phase  Initial     Distance  1015 feet     Walk Time  6 minutes     MPH  1.92     METS  2     RPE  13     Perceived Dyspnea   1     VO2 Peak  7.01     Symptoms  Yes (comment)  Comments  SOB, unsteady gait in shoes     Resting HR  80 bpm     Resting BP  130/70     Resting Oxygen Saturation   98 %     Exercise Oxygen Saturation  during 6 min walk  94 %     Max Ex. HR  107 bpm     Max Ex. BP  156/76     2 Minute Post BP  134/64        Oxygen Initial Assessment:   Oxygen Re-Evaluation:   Oxygen Discharge (Final Oxygen Re-Evaluation):   Initial Exercise Prescription: Initial Exercise Prescription - 09/22/18 1400      Date of Initial Exercise RX and Referring Provider   Date  09/22/18    Referring Provider  Kathlyn Sacramento MD      Treadmill   MPH  1.3    Grade  0.5    Minutes  15    METs  2.09      NuStep   Level  1    SPM  80    Minutes  15    METs  2      Recumbant Elliptical   Level  1    RPM  50    Minutes  15    METs  2      Prescription Details   Frequency (times per week)  3    Duration  Progress to 45 minutes of aerobic exercise without signs/symptoms of physical distress      Intensity   THRR 40-80% of Max Heartrate  103-126    Ratings of Perceived Exertion  11-13    Perceived Dyspnea  0-4      Progression   Progression  Continue to progress workloads to maintain intensity without signs/symptoms of physical distress.      Resistance Training   Training Prescription  Yes    Weight  3 lbs    Reps  10-15       Perform Capillary Blood Glucose checks as needed.  Exercise Prescription Changes: Exercise Prescription Changes    Row Name 09/22/18 1400             Response to Exercise   Blood Pressure (Admit)  130/70       Blood Pressure (Exercise)  156/76       Blood Pressure (Exit)  134/64       Heart Rate (Admit)  80 bpm       Heart Rate (Exercise)  100 bpm       Heart Rate (Exit)  76 bpm       Oxygen  Saturation (Admit)  98 %       Oxygen Saturation (Exercise)  94 %       Rating of Perceived Exertion (Exercise)  13       Perceived Dyspnea (Exercise)  1       Symptoms  SOB, usteady shoes, wobbly gait       Comments  walk test results          Exercise Comments:   Exercise Goals and Review: Exercise Goals    Row Name 09/22/18 1452             Exercise Goals   Increase Physical Activity  Yes       Intervention  Provide advice, education, support and counseling about physical activity/exercise needs.;Develop an individualized exercise prescription for aerobic and resistive training based on initial evaluation findings, risk stratification, comorbidities and  participant's personal goals.       Expected Outcomes  Short Term: Attend rehab on a regular basis to increase amount of physical activity.;Long Term: Add in home exercise to make exercise part of routine and to increase amount of physical activity.;Long Term: Exercising regularly at least 3-5 days a week.       Increase Strength and Stamina  Yes       Intervention  Provide advice, education, support and counseling about physical activity/exercise needs.;Develop an individualized exercise prescription for aerobic and resistive training based on initial evaluation findings, risk stratification, comorbidities and participant's personal goals.       Expected Outcomes  Short Term: Increase workloads from initial exercise prescription for resistance, speed, and METs.;Short Term: Perform resistance training exercises routinely during rehab and add in resistance training at home;Long Term: Improve cardiorespiratory fitness, muscular endurance and strength as measured by increased METs and functional capacity (6MWT)       Able to understand and use rate of perceived exertion (RPE) scale  Yes       Intervention  Provide education and explanation on how to use RPE scale       Expected Outcomes  Short Term: Able to use RPE daily in rehab to  express subjective intensity level;Long Term:  Able to use RPE to guide intensity level when exercising independently       Able to understand and use Dyspnea scale  Yes       Intervention  Provide education and explanation on how to use Dyspnea scale       Expected Outcomes  Short Term: Able to use Dyspnea scale daily in rehab to express subjective sense of shortness of breath during exertion;Long Term: Able to use Dyspnea scale to guide intensity level when exercising independently       Knowledge and understanding of Target Heart Rate Range (THRR)  Yes       Intervention  Provide education and explanation of THRR including how the numbers were predicted and where they are located for reference       Expected Outcomes  Short Term: Able to state/look up THRR;Short Term: Able to use daily as guideline for intensity in rehab;Long Term: Able to use THRR to govern intensity when exercising independently       Able to check pulse independently  Yes       Intervention  Provide education and demonstration on how to check pulse in carotid and radial arteries.;Review the importance of being able to check your own pulse for safety during independent exercise       Expected Outcomes  Short Term: Able to explain why pulse checking is important during independent exercise;Long Term: Able to check pulse independently and accurately       Understanding of Exercise Prescription  Yes       Intervention  Provide education, explanation, and written materials on patient's individual exercise prescription       Expected Outcomes  Long Term: Able to explain home exercise prescription to exercise independently;Short Term: Able to explain program exercise prescription          Exercise Goals Re-Evaluation :   Discharge Exercise Prescription (Final Exercise Prescription Changes): Exercise Prescription Changes - 09/22/18 1400      Response to Exercise   Blood Pressure (Admit)  130/70    Blood Pressure (Exercise)   156/76    Blood Pressure (Exit)  134/64    Heart Rate (Admit)  80 bpm    Heart  Rate (Exercise)  100 bpm    Heart Rate (Exit)  76 bpm    Oxygen Saturation (Admit)  98 %    Oxygen Saturation (Exercise)  94 %    Rating of Perceived Exertion (Exercise)  13    Perceived Dyspnea (Exercise)  1    Symptoms  SOB, usteady shoes, wobbly gait    Comments  walk test results       Nutrition:  Target Goals: Understanding of nutrition guidelines, daily intake of sodium <1540m, cholesterol <2013m calories 30% from fat and 7% or less from saturated fats, daily to have 5 or more servings of fruits and vegetables.  Biometrics: Pre Biometrics - 09/22/18 1453      Pre Biometrics   Height  5' 8.4" (1.737 m)    Weight  186 lb 14.4 oz (84.8 kg)    Waist Circumference  40 inches    Hip Circumference  40.5 inches    Waist to Hip Ratio  0.99 %    BMI (Calculated)  28.1    Single Leg Stand  15.44 seconds        Nutrition Therapy Plan and Nutrition Goals: Nutrition Therapy & Goals - 09/22/18 1340      Intervention Plan   Intervention  Prescribe, educate and counsel regarding individualized specific dietary modifications aiming towards targeted core components such as weight, hypertension, lipid management, diabetes, heart failure and other comorbidities.;Nutrition handout(s) given to patient.    Expected Outcomes  Short Term Goal: Understand basic principles of dietary content, such as calories, fat, sodium, cholesterol and nutrients.;Short Term Goal: A plan has been developed with personal nutrition goals set during dietitian appointment.;Long Term Goal: Adherence to prescribed nutrition plan.       Nutrition Assessments: Nutrition Assessments - 09/22/18 1341      MEDFICTS Scores   Pre Score  47       Nutrition Goals Re-Evaluation:   Nutrition Goals Discharge (Final Nutrition Goals Re-Evaluation):   Psychosocial: Target Goals: Acknowledge presence or absence of significant depression  and/or stress, maximize coping skills, provide positive support system. Participant is able to verbalize types and ability to use techniques and skills needed for reducing stress and depression.   Initial Review & Psychosocial Screening: Initial Psych Review & Screening - 09/22/18 1338      Initial Review   Current issues with  Current Anxiety/Panic;Current Stress Concerns   current health issues   Source of Stress Concerns  Financial;Family;Unable to participate in former interests or hobbies;Unable to perform yard/household activities      [place] Yes   wife, daughter, friends, pastor, church family     Barriers   Psychosocial barriers to participate in program  There are no identifiable barriers or psychosocial needs.;The patient should benefit from training in stress management and relaxation.      Screening Interventions   Interventions  Encouraged to exercise;Program counselor consult;To provide support and resources with identified psychosocial needs;Provide feedback about the scores to participant    Expected Outcomes  Short Term goal: Utilizing psychosocial counselor, staff and physician to assist with identification of specific Stressors or current issues interfering with healing process. Setting desired goal for each stressor or current issue identified.;Long Term Goal: Stressors or current issues are controlled or eliminated.;Short Term goal: Identification and review with participant of any Quality of Life or Depression concerns found by scoring the questionnaire.;Long Term goal: The participant improves quality of Life and PHQ9 Scores as seen by  post scores and/or verbalization of changes       Quality of Life Scores:  Quality of Life - 09/22/18 1340      Quality of Life   Select  Quality of Life      Quality of Life Scores   Health/Function Pre  23.14 %    Socioeconomic Pre  26.36 %    Psych/Spiritual Pre  28.29 %    Family Pre  30 %     GLOBAL Pre  28.95 %      Scores of 19 and below usually indicate a poorer quality of life in these areas.  A difference of  2-3 points is a clinically meaningful difference.  A difference of 2-3 points in the total score of the Quality of Life Index has been associated with significant improvement in overall quality of life, self-image, physical symptoms, and general health in studies assessing change in quality of life.  PHQ-9: Recent Review Flowsheet Data    Depression screen [facility] 2/9 09/22/2018 07/08/2018 11/11/2017 11/11/2017 10/30/2016   Decreased Interest 0 0 0 0 0   Down, Depressed, Hopeless 0 0 0 0 0   PHQ - 2 Score 0 0 0 0 0   Altered sleeping 0 - 0 - -   Tired, decreased energy 1 - 0 - -   Change in appetite 0 - 0 - -   Feeling bad or failure about yourself  0 - 0 - -   Trouble concentrating 0 - 0 - -   Moving slowly or fidgety/restless 0 - 0 - -   Suicidal thoughts 0 - 0 - -   PHQ-9 Score 1 - 0 - -   Difficult doing work/chores Not difficult at all - Not difficult at all - -     Interpretation of Total Score  Total Score Depression Severity:  1-4 = Minimal depression, 5-9 = Mild depression, 10-14 = Moderate depression, 15-19 = Moderately severe depression, 20-27 = Severe depression   Psychosocial Evaluation and Intervention:   Psychosocial Re-Evaluation:   Psychosocial Discharge (Final Psychosocial Re-Evaluation):   Vocational Rehabilitation: Provide vocational rehab assistance to qualifying candidates.   Vocational Rehab Evaluation & Intervention: Vocational Rehab - 09/22/18 1307      Initial Vocational Rehab Evaluation & Intervention   Assessment shows need for Vocational Rehabilitation  No       Education: Education Goals: Education classes will be provided on a variety of topics geared toward better understanding of heart health and risk factor modification. Participant will state understanding/return demonstration of topics presented as noted by education test  scores.  Learning Barriers/Preferences: Learning Barriers/Preferences - 09/22/18 1306      Learning Barriers/Preferences   Learning Barriers  Hearing    Learning Preferences  None       Education Topics:  AED/CPR: - Group verbal and written instruction with the use of models to demonstrate the basic use of the AED with the basic ABC's of resuscitation.   General Nutrition Guidelines/Fats and Fiber: -Group instruction provided by verbal, written material, models and posters to present the general guidelines for heart healthy nutrition. Gives an explanation and review of dietary fats and fiber.   Controlling Sodium/Reading Food Labels: -Group verbal and written material supporting the discussion of sodium use in heart healthy nutrition. Review and explanation with models, verbal and written materials for utilization of the food label.   Exercise Physiology & General Exercise Guidelines: - Group verbal and written instruction with models to review the  exercise physiology of the cardiovascular system and associated critical values. Provides general exercise guidelines with specific guidelines to those with heart or lung disease.    Aerobic Exercise & Resistance Training: - Gives group verbal and written instruction on the various components of exercise. Focuses on aerobic and resistive training programs and the benefits of this training and how to safely progress through these programs..   Flexibility, Balance, Mind/Body Relaxation: Provides group verbal/written instruction on the benefits of flexibility and balance training, including mind/body exercise modes such as yoga, pilates and tai chi.  Demonstration and skill practice provided.   Stress and Anxiety: - Provides group verbal and written instruction about the health risks of elevated stress and causes of high stress.  Discuss the correlation between heart/lung disease and anxiety and treatment options. Review healthy ways to  manage with stress and anxiety.   Depression: - Provides group verbal and written instruction on the correlation between heart/lung disease and depressed mood, treatment options, and the stigmas associated with seeking treatment.   Anatomy & Physiology of the Heart: - Group verbal and written instruction and models provide basic cardiac anatomy and physiology, with the coronary electrical and arterial systems. Review of Valvular disease and Heart Failure   Cardiac Procedures: - Group verbal and written instruction to review commonly prescribed medications for heart disease. Reviews the medication, class of the drug, and side effects. Includes the steps to properly store meds and maintain the prescription regimen. (beta blockers and nitrates)   Cardiac Medications I: - Group verbal and written instruction to review commonly prescribed medications for heart disease. Reviews the medication, class of the drug, and side effects. Includes the steps to properly store meds and maintain the prescription regimen.   Cardiac Medications II: -Group verbal and written instruction to review commonly prescribed medications for heart disease. Reviews the medication, class of the drug, and side effects. (all other drug classes)    Go Sex-Intimacy & Heart Disease, Get SMART - Goal Setting: - Group verbal and written instruction through game format to discuss heart disease and the return to sexual intimacy. Provides group verbal and written material to discuss and apply goal setting through the application of the S.M.A.R.T. Method.   Other Matters of the Heart: - Provides group verbal, written materials and models to describe Stable Angina and Peripheral Artery. Includes description of the disease process and treatment options available to the cardiac patient.   Exercise & Equipment Safety: - Individual verbal instruction and demonstration of equipment use and safety with use of the equipment.    Cardiac Rehab from 09/22/2018 in [facility] Cardiac and Pulmonary Rehab  Date  09/22/18  Educator  [facility]  Instruction Review Code  1- Verbalizes Understanding      Infection Prevention: - Provides verbal and written material to individual with discussion of infection control including proper hand washing and proper equipment cleaning during exercise session.   Cardiac Rehab from 09/22/2018 in [facility] Cardiac and Pulmonary Rehab  Date  09/22/18  Educator  [facility]  Instruction Review Code  1- Verbalizes Understanding      Falls Prevention: - Provides verbal and written material to individual with discussion of falls prevention and safety.   Cardiac Rehab from 09/22/2018 in [facility] Cardiac and Pulmonary Rehab  Date  09/22/18  Educator  [facility]  Instruction Review Code  1- Verbalizes Understanding      Diabetes: - Individual verbal and written instruction to review signs/symptoms of diabetes, desired ranges of glucose level fasting, after meals  and with exercise. Acknowledge that pre and post exercise glucose checks will be done for 3 sessions at entry of program.   Cardiac Rehab from 09/22/2018 in [facility] Cardiac and Pulmonary Rehab  Date  09/22/18  Educator  [facility]  Instruction Review Code  1- Verbalizes Understanding      Know Your Numbers and Risk Factors: -Group verbal and written instruction about important numbers in your health.  Discussion of what are risk factors and how they play a role in the disease process.  Review of Cholesterol, Blood Pressure, Diabetes, and BMI and the role they play in your overall health.   Sleep Hygiene: -Provides group verbal and written instruction about how sleep can affect your health.  Define sleep hygiene, discuss sleep cycles and impact of sleep habits. Review good sleep hygiene tips.    Other: -Provides group and verbal instruction on various topics (see comments)   Knowledge Questionnaire Score: Knowledge Questionnaire Score - 09/22/18 1306      Knowledge  Questionnaire Score   Pre Score  21/25   correct answers reviewed with Wali. Focus on anatomy, nutrition, exercise      Core Components/Risk Factors/Patient Goals at Admission: Personal Goals and Risk Factors at Admission - 09/22/18 1305      Core Components/Risk Factors/Patient Goals on Admission    Weight Management  Yes;Weight Maintenance    Intervention  Weight Management: Develop a combined nutrition and exercise program designed to reach desired caloric intake, while maintaining appropriate intake of nutrient and fiber, sodium and fats, and appropriate energy expenditure required for the weight goal.;Weight Management: Provide education and appropriate resources to help participant work on and attain dietary goals.    Admit Weight  185 lb (83.9 kg)    Expected Outcomes  Short Term: Continue to assess and modify interventions until short term weight is achieved;Long Term: Adherence to nutrition and physical activity/exercise program aimed toward attainment of established weight goal;Weight Maintenance: Understanding of the daily nutrition guidelines, which includes 25-35% calories from fat, 7% or less cal from saturated fats, less than 224m cholesterol, less than 1.5gm of sodium, & 5 or more servings of fruits and vegetables daily;Understanding recommendations for meals to include 15-35% energy as protein, 25-35% energy from fat, 35-60% energy from carbohydrates, less than 2029mof dietary cholesterol, 20-35 gm of total fiber daily;Understanding of distribution of calorie intake throughout the day with the consumption of 4-5 meals/snacks    Diabetes  Yes    Intervention  Provide education about signs/symptoms and action to take for hypo/hyperglycemia.;Provide education about proper nutrition, including hydration, and aerobic/resistive exercise prescription along with prescribed medications to achieve blood glucose in normal ranges: Fasting glucose 65-99 mg/dL    Expected Outcomes  Short Term:  Participant verbalizes understanding of the signs/symptoms and immediate care of hyper/hypoglycemia, proper foot care and importance of medication, aerobic/resistive exercise and nutrition plan for blood glucose control.;Long Term: Attainment of HbA1C < 7%.    Lipids  Yes    Intervention  Provide education and support for participant on nutrition & aerobic/resistive exercise along with prescribed medications to achieve LDL <7072mHDL >68m55m  Expected Outcomes  Short Term: Participant states understanding of desired cholesterol values and is compliant with medications prescribed. Participant is following exercise prescription and nutrition guidelines.;Long Term: Cholesterol controlled with medications as prescribed, with individualized exercise RX and with personalized nutrition plan. Value goals: LDL < 70mg61mL > 40 mg.       Core Components/Risk Factors/Patient Goals Review:  Core Components/Risk Factors/Patient Goals at Discharge (Final Review):    ITP Comments: ITP Comments    Row Name 09/22/18 1301           ITP Comments  Med Review completed. Initial ITP created. Diagnosis can be found in [facility] 8/19          Comments: Initial ITP

## 2018-09-22 NOTE — Telephone Encounter (Signed)
Patient advised as directed below.  Thanks,  -Joseline 

## 2018-09-24 ENCOUNTER — Ambulatory Visit: Payer: PPO

## 2018-09-24 ENCOUNTER — Encounter: Payer: PPO | Admitting: *Deleted

## 2018-09-24 DIAGNOSIS — I482 Chronic atrial fibrillation, unspecified: Secondary | ICD-10-CM | POA: Diagnosis not present

## 2018-09-24 DIAGNOSIS — I4891 Unspecified atrial fibrillation: Secondary | ICD-10-CM

## 2018-09-24 DIAGNOSIS — Z952 Presence of prosthetic heart valve: Secondary | ICD-10-CM

## 2018-09-24 DIAGNOSIS — Z5181 Encounter for therapeutic drug level monitoring: Secondary | ICD-10-CM | POA: Diagnosis not present

## 2018-09-24 DIAGNOSIS — Z950 Presence of cardiac pacemaker: Secondary | ICD-10-CM

## 2018-09-24 DIAGNOSIS — I4892 Unspecified atrial flutter: Secondary | ICD-10-CM | POA: Diagnosis not present

## 2018-09-24 LAB — GLUCOSE, CAPILLARY
GLUCOSE-CAPILLARY: 97 mg/dL (ref 70–99)
GLUCOSE-CAPILLARY: 99 mg/dL (ref 70–99)

## 2018-09-24 LAB — POCT INR: INR: 5.2 — AB (ref 2.0–3.0)

## 2018-09-24 NOTE — Patient Instructions (Signed)
Please skip coumadin today & tomorrow, then resume dosage of 1.5 tablets every day EXCEPT 2 [place]. Recheck in 2 weeks. Please call [phone] if you have a GI procedure and need to hold coumadin.

## 2018-09-24 NOTE — Progress Notes (Signed)
Daily Session Note  Patient Details  Name: [name] [name] MRN: [id number] Date of Birth: March 27, 1934 Referring Provider:     Cardiac Rehab from 09/22/2018 in [facility] Cardiac and Pulmonary Rehab  Referring Provider  Kathlyn Sacramento MD      Encounter Date: 09/24/2018  Check In: Session Check In - 09/24/18 1631      Check-In   Supervising physician immediately available to respond to emergencies  See telemetry face sheet for immediately available ER MD    Location  [facility]-Cardiac & Pulmonary Rehab    Staff Present  Gerlene Burdock, RN, BSN;Joseph [employer], [place], ACSM CEP, Exercise Physiologist    Medication changes reported      No    Fall or balance concerns reported     No    Tobacco Cessation  No Change    Warm-up and Cool-down  Performed on first and last piece of equipment    Resistance Training Performed  Yes    VAD Patient?  No      Pain Assessment   Currently in Pain?  No/denies          Social History   Tobacco Use  Smoking Status Former Smoker  . Types: Cigars, Cigarettes  . Last attempt to quit: 10/02/1974  . Years since quitting: 44.0  Smokeless Tobacco Never Used  Tobacco Comment   still smokes cigars once a day    Goals Met:  Proper associated with RPD/PD & O2 Sat No report of cardiac concerns or symptoms Strength training completed today  Goals Unmet:  Not Applicable  Comments:  First full day of exercise!  Patient was oriented to gym and equipment including functions, settings, policies, and procedures.  Patient's individual exercise prescription and treatment plan were reviewed.  All starting workloads were established based on the results of the 6 minute walk test done at initial orientation visit.  The plan for exercise progression was also introduced and progression will be customized based on patient's performance and goals.   Dr. Emily Filbert is Medical Director for [place] and [facility].

## 2018-09-25 ENCOUNTER — Encounter: Payer: Self-pay | Admitting: Dietician

## 2018-09-25 DIAGNOSIS — Z952 Presence of prosthetic heart valve: Secondary | ICD-10-CM

## 2018-09-25 LAB — GLUCOSE, CAPILLARY: Glucose-Capillary: 129 mg/dL — ABNORMAL HIGH (ref 70–99)

## 2018-09-25 NOTE — Progress Notes (Signed)
Daily Session Note  Patient Details  Name: [name] [name] MRN: [id number] Date of Birth: 23-Feb-1934 Referring Provider:     Cardiac Rehab from 09/22/2018 in [facility] Cardiac and Pulmonary Rehab  Referring Provider  Kathlyn Sacramento MD      Encounter Date: 09/25/2018  Check In: Session Check In - 09/25/18 1611      Check-In   Supervising physician immediately available to respond to emergencies  See telemetry face sheet for immediately available ER MD    Location  [facility]-Cardiac & Pulmonary Rehab    Staff Present  Renita Papa, RN BSN;Joseph [address] [place], [place], ACSM CEP, Exercise Physiologist    Medication changes reported      No    Fall or balance concerns reported     No    Warm-up and Cool-down  Performed as group-led instruction    Resistance Training Performed  Yes    VAD Patient?  No    PAD/SET Patient?  No      Pain Assessment   Currently in Pain?  No/denies          Social History   Tobacco Use  Smoking Status Former Smoker  . Types: Cigars, Cigarettes  . Last attempt to quit: 10/02/1974  . Years since quitting: 44.0  Smokeless Tobacco Never Used  Tobacco Comment   still smokes cigars once a day    Goals Met:  Independence with exercise equipment Exercise tolerated well No report of cardiac concerns or symptoms Strength training completed today  Goals Unmet:  Not Applicable  Comments: Pt able to follow exercise prescription today without complaint.  Will continue to monitor for progression.    Dr. Emily Filbert is Medical Director for [place] and [facility].

## 2018-09-29 DIAGNOSIS — Z45018 Encounter for adjustment and management of other part of cardiac pacemaker: Secondary | ICD-10-CM | POA: Diagnosis not present

## 2018-09-29 DIAGNOSIS — I493 Ventricular premature depolarization: Secondary | ICD-10-CM | POA: Diagnosis not present

## 2018-09-29 DIAGNOSIS — Z950 Presence of cardiac pacemaker: Secondary | ICD-10-CM | POA: Diagnosis not present

## 2018-09-29 DIAGNOSIS — Z8679 Personal history of other diseases of the circulatory system: Secondary | ICD-10-CM | POA: Diagnosis not present

## 2018-09-29 DIAGNOSIS — Z952 Presence of prosthetic heart valve: Secondary | ICD-10-CM | POA: Diagnosis not present

## 2018-10-06 ENCOUNTER — Ambulatory Visit: Payer: PPO

## 2018-10-06 ENCOUNTER — Other Ambulatory Visit: Payer: Self-pay | Admitting: Cardiovascular Disease

## 2018-10-06 ENCOUNTER — Ambulatory Visit (INDEPENDENT_AMBULATORY_CARE_PROVIDER_SITE_OTHER): Payer: PPO | Admitting: Family Medicine

## 2018-10-06 ENCOUNTER — Encounter: Payer: Self-pay | Admitting: Family Medicine

## 2018-10-06 VITALS — BP 128/70 | HR 70 | Temp 98.0°F | Ht 68.0 in | Wt 191.0 lb

## 2018-10-06 DIAGNOSIS — K279 Peptic ulcer, site unspecified, unspecified as acute or chronic, without hemorrhage or perforation: Secondary | ICD-10-CM

## 2018-10-06 DIAGNOSIS — I421 Obstructive hypertrophic cardiomyopathy: Secondary | ICD-10-CM | POA: Diagnosis not present

## 2018-10-06 DIAGNOSIS — I4891 Unspecified atrial fibrillation: Secondary | ICD-10-CM | POA: Diagnosis not present

## 2018-10-06 DIAGNOSIS — I482 Chronic atrial fibrillation, unspecified: Secondary | ICD-10-CM | POA: Diagnosis not present

## 2018-10-06 DIAGNOSIS — I10 Essential (primary) hypertension: Secondary | ICD-10-CM

## 2018-10-06 DIAGNOSIS — Z950 Presence of cardiac pacemaker: Secondary | ICD-10-CM

## 2018-10-06 DIAGNOSIS — Z5181 Encounter for therapeutic drug level monitoring: Secondary | ICD-10-CM

## 2018-10-06 DIAGNOSIS — E1142 Type 2 diabetes mellitus with diabetic polyneuropathy: Secondary | ICD-10-CM

## 2018-10-06 DIAGNOSIS — I700 Atherosclerosis of aorta: Secondary | ICD-10-CM

## 2018-10-06 DIAGNOSIS — I4892 Unspecified atrial flutter: Secondary | ICD-10-CM | POA: Diagnosis not present

## 2018-10-06 DIAGNOSIS — Z952 Presence of prosthetic heart valve: Secondary | ICD-10-CM

## 2018-10-06 DIAGNOSIS — I442 Atrioventricular block, complete: Secondary | ICD-10-CM | POA: Diagnosis not present

## 2018-10-06 LAB — GLUCOSE, CAPILLARY
Glucose-Capillary: 110 mg/dL — ABNORMAL HIGH (ref 70–99)
Glucose-Capillary: 118 mg/dL — ABNORMAL HIGH (ref 70–99)

## 2018-10-06 LAB — POCT INR: INR: 3.7 — AB (ref 2.0–3.0)

## 2018-10-06 NOTE — Progress Notes (Signed)
Patient: [name] [name] Adult    DOB: 06-09-1934   84 y.o.   MRN: [id number] Visit Date: 10/06/2018  Today's Provider: [name], MD   Chief Complaint  Patient presents with  . Follow-up   Subjective:     HPI  Patient comes in today for a follow up. He was last seen in the office 2 weeks ago. At the time, he was c/o a burning sensation in his upper abdomen area. He was started in pantoprazole 40mg  BID to see if this would help. Patient reports that the burning sensation has completely resolved.  He is very slowly getting his strength back from his surgery and is starting to feel better.  He and his wife went to the beach last week and this helped a lot. BP Readings from Last 3 Encounters:  10/06/18 128/70  09/15/18 122/68  09/04/18 130/60      Allergies  Allergen Reactions  . Macrolides And Ketolides Other (See Comments)  . Mycinettes   . Nitrofuran Derivatives Other (See Comments)  . Nitrofurantoin Other (See Comments)  . Erythromycin Itching and Rash    Other reaction(s): UNKNOWN  And red skin     Current Outpatient Medications:  .  acetaminophen (TYLENOL) 325 MG tablet, Take 650 mg by mouth every 6 (six) hours as needed., Disp: , Rfl:  .  aspirin EC 81 MG tablet, Take 1 tablet (81 mg total) by mouth daily., Disp: , Rfl:  .  glucose blood (ONE TOUCH ULTRA TEST) test strip, USE ONE STRIP TO CHECK GLUCOSE ONCE DAILY, Disp: 100 each, Rfl: 12 .  latanoprost (XALATAN) 0.005 % ophthalmic solution, Place 1 drop into both eyes at bedtime. , Disp: , Rfl:  .  levothyroxine (SYNTHROID, LEVOTHROID) 50 MCG tablet, TAKE 1 TABLET BY MOUTH ONCE DAILY, Disp: 90 tablet, Rfl: 3 .  metFORMIN (GLUCOPHAGE) 1000 MG tablet, Take 1 tablet (1,000 mg total) by mouth daily with breakfast., Disp: 90 tablet, Rfl: 3 .  metoprolol tartrate (LOPRESSOR) 25 MG tablet, Take 12.5 mg by mouth 2 (two) times daily., Disp: , Rfl:  .  NON FORMULARY, Metoprolol 50 mg qd., Disp: , Rfl:  .   ONE TOUCH ULTRA TEST test strip, USE TO TEST BLOOD SUGAR ONCE DAILY, Disp: 50 each, Rfl: 25 .  ONETOUCH DELICA LANCETS 67M MISC, USE ONE LANCET TO CHECK BLOOD GLUCOSE ONCE DAILY, Disp: 100 each, Rfl: 12 .  ONETOUCH DELICA LANCETS 09O MISC, USE TO TEST BLOOD SUGAR ONCE DAILY, Disp: 100 each, Rfl: 12 .  pantoprazole (PROTONIX) 40 MG tablet, Take 1 tablet (40 mg total) by mouth 2 (two) times daily., Disp: 60 tablet, Rfl: 3 .  simvastatin (ZOCOR) 40 MG tablet, TAKE 1/2 (ONE-HALF) TABLET BY MOUTH AT BEDTIME, Disp: 45 tablet, Rfl: 3 .  warfarin (COUMADIN) 3 MG tablet, Take 1 tablet (3 mg total) by mouth daily at 6 PM., Disp: 30 tablet, Rfl: 2  Review of Systems  Constitutional: Negative for activity change, appetite change, chills, diaphoresis, fatigue, fever and unexpected weight change.  HENT: Negative.   Eyes: Negative.   Respiratory: Negative for cough and shortness of breath.   Cardiovascular: Negative for chest pain, palpitations and leg swelling.  Gastrointestinal: Negative for abdominal distention, abdominal pain, constipation, diarrhea, nausea, rectal pain and vomiting.  Endocrine: Negative for cold intolerance, heat intolerance, polydipsia, polyphagia and polyuria.  Skin: Negative.   Allergic/Immunologic: Negative.   Neurological: Negative for dizziness, light-headedness and headaches.  Psychiatric/Behavioral: Negative.  Social History   Tobacco Use  . Smoking status: Former Smoker    Types: Cigars, Cigarettes    Last attempt to quit: 10/02/1974    Years since quitting: 44.0  . Smokeless tobacco: Never Used  . Tobacco comment: still smokes cigars once a day  Substance Use Topics  . Alcohol use: No    Frequency: Never      Objective:   BP 128/70 (BP Location: Left Arm, Patient Position: Sitting, Cuff Size: Normal)   Pulse 70   Temp 98 F (36.7 C)   Ht 5\' 8"  (1.727 m)   Wt 191 lb (86.6 kg)   SpO2 98%   BMI 29.04 kg/m  Vitals:   10/06/18 1139  BP: 128/70  Pulse:  70  Temp: 98 F (36.7 C)  SpO2: 98%  Weight: 191 lb (86.6 kg)  Height: 5\' 8"  (1.727 m)     Physical Exam Constitutional:      Appearance: She is well-developed.  HENT:     Head: Normocephalic and atraumatic.     Right Ear: External ear normal.     Left Ear: External ear normal.     Nose: Nose normal.  Eyes:     General: No scleral icterus.    Conjunctiva/sclera: Conjunctivae normal.  Neck:     Thyroid: No thyromegaly.     Vascular: No JVD.  Cardiovascular:     Rate and Rhythm: Normal rate and regular rhythm.     Heart sounds: Normal heart sounds.  Pulmonary:     Effort: Pulmonary effort is normal.     Breath sounds: Normal breath sounds.  Abdominal:     Palpations: Abdomen is soft.  Musculoskeletal:        General: No swelling.  Skin:    General: Skin is warm and dry.  Neurological:     General: No focal deficit present.     Mental Status: She is alert and oriented to person, place, and time. Mental status is at baseline.  Psychiatric:        Mood and Affect: Mood normal.        Behavior: Behavior normal.        Thought Content: Thought content normal.        Judgment: Judgment normal.         Assessment & Plan     1. PUD (peptic ulcer disease)/Gastritis   2. Atrial fibrillation and flutter ([place])   3. Essential (primary) hypertension   4. Cardiomyopathy, hypertrophic obstructive ([place]) Patient is status post myomectomy and doing well.  5. Diabetic polyneuropathy associated with type 2 diabetes mellitus ([employer])   6. Atherosclerosis of aorta ([place])   7. Heart block AV third degree (HCC) More than 50% of this visit is spent in counseling and coordination/review of care.  8. Pacemaker Doing well.  Right now set at 2, they tried to set it at 34 and he felt poorly 9.s/p Aortic valve replacement    I have done the exam and reviewed the above chart and it is accurate to the best of my knowledge. Development worker, community has been used in this note in any air  is in the dictation or transcription are unintentional.  [name], MD  [place]

## 2018-10-06 NOTE — Patient Instructions (Signed)
Cut down pantoprazole to once daily until 11/28/2018.

## 2018-10-06 NOTE — Telephone Encounter (Signed)
Please review for refill, Thanks !  

## 2018-10-06 NOTE — Patient Instructions (Signed)
Please skip coumadin tonight, then resume dosage of 1.5 tablets every day EXCEPT 2 [place]. Recheck in 3 weeks. Please call [phone] if you have a GI procedure and need to hold coumadin.

## 2018-10-06 NOTE — Progress Notes (Signed)
Daily Session Note  Patient Details  Name: [name] [name] MRN: [id number] Date of Birth: [birth year] Referring Provider:     Cardiac Rehab from 09/22/2018 in [facility] Cardiac and Pulmonary Rehab  Referring Provider  Kathlyn Sacramento MD      Encounter Date: 10/06/2018  Check In:      Social History   Tobacco Use  Smoking Status Former Smoker  . Types: Cigars, Cigarettes  . Last attempt to quit: 10/02/1974  . Years since quitting: 44.0  Smokeless Tobacco Never Used  Tobacco Comment   still smokes cigars once a day    Goals Met:  Independence with exercise equipment Exercise tolerated well No report of cardiac concerns or symptoms Strength training completed today  Goals Unmet:  Not Applicable  Comments: Pt able to follow exercise prescription today without complaint.  Will continue to monitor for progression.    Dr. Emily Filbert is Medical Director for [place] and [facility].

## 2018-10-07 DIAGNOSIS — I442 Atrioventricular block, complete: Secondary | ICD-10-CM | POA: Insufficient documentation

## 2018-10-07 DIAGNOSIS — Z950 Presence of cardiac pacemaker: Secondary | ICD-10-CM | POA: Insufficient documentation

## 2018-10-07 DIAGNOSIS — Z952 Presence of prosthetic heart valve: Secondary | ICD-10-CM | POA: Insufficient documentation

## 2018-10-09 DIAGNOSIS — Z952 Presence of prosthetic heart valve: Secondary | ICD-10-CM | POA: Diagnosis not present

## 2018-10-09 DIAGNOSIS — G4733 Obstructive sleep apnea (adult) (pediatric): Secondary | ICD-10-CM | POA: Diagnosis not present

## 2018-10-09 LAB — GLUCOSE, CAPILLARY: Glucose-Capillary: 113 mg/dL — ABNORMAL HIGH (ref 70–99)

## 2018-10-09 NOTE — Progress Notes (Signed)
Daily Session Note  Patient Details  Name: [name] [name] MRN: [id number] Date of Birth: 07/20/1934 Referring Provider:     Cardiac Rehab from 09/22/2018 in [facility] Cardiac and Pulmonary Rehab  Referring Provider  Kathlyn Sacramento MD      Encounter Date: 10/09/2018  Check In:      Social History   Tobacco Use  Smoking Status Former Smoker  . Types: Cigars, Cigarettes  . Last attempt to quit: 10/02/1974  . Years since quitting: 44.0  Smokeless Tobacco Never Used  Tobacco Comment   still smokes cigars once a day    Goals Met:  Independence with exercise equipment Exercise tolerated well No report of cardiac concerns or symptoms Strength training completed today  Goals Unmet:  Not Applicable  Comments: Pt able to follow exercise prescription today without complaint.  Will continue to monitor for progression.    Dr. Emily Filbert is Medical Director for [place] and [facility].

## 2018-10-13 DIAGNOSIS — Z952 Presence of prosthetic heart valve: Secondary | ICD-10-CM | POA: Diagnosis not present

## 2018-10-13 NOTE — Progress Notes (Signed)
Daily Session Note  Patient Details  Name: ZAILEN ALBARRAN MRN: [id number] Date of Birth: 05/23/1934 Referring Provider:     Cardiac Rehab from 09/22/2018 in [facility] Cardiac and Pulmonary Rehab  Referring Provider  Kathlyn Sacramento MD      Encounter Date: 10/13/2018  Check In: Session Check In - 10/13/18 1727      Check-In   Supervising physician immediately available to respond to emergencies  See telemetry face sheet for immediately available ER MD    Location  [facility]-Cardiac & Pulmonary Rehab    Staff Present  Gerlene Burdock, RN, Vickki Hearing, BA, ACSM CEP, Exercise Physiologist;Jeanna Durrell BS, Exercise Physiologist          Social History   Tobacco Use  Smoking Status Former Smoker  . Types: Cigars, Cigarettes  . Last attempt to quit: 10/02/1974  . Years since quitting: 44.0  Smokeless Tobacco Never Used  Tobacco Comment   still smokes cigars once a day    Goals Met:  Independence with exercise equipment Exercise tolerated well No report of cardiac concerns or symptoms Strength training completed today  Goals Unmet:  Not Applicable  Comments: Pt able to follow exercise prescription today without complaint.  Will continue to monitor for progression.    Dr. Emily Filbert is Medical Director for [place] and [facility].

## 2018-10-14 ENCOUNTER — Encounter: Payer: Self-pay | Admitting: *Deleted

## 2018-10-14 DIAGNOSIS — Z952 Presence of prosthetic heart valve: Secondary | ICD-10-CM

## 2018-10-14 NOTE — Progress Notes (Signed)
Cardiac Individual Treatment Plan  Patient Details  Name: [name] [name] MRN: [id number] Date of Birth: 12/27/1933 Referring Provider:     Cardiac Rehab from 09/22/2018 in [facility] Cardiac and Pulmonary Rehab  Referring Provider  Kathlyn Sacramento MD      Initial Encounter Date:    Cardiac Rehab from 09/22/2018 in [facility] Cardiac and Pulmonary Rehab  Date  09/22/18      Visit Diagnosis: S/P aortic valve replacement  S/P mitral valve replacement  Patient's Home Medications on Admission:  Current Outpatient Medications:  .  acetaminophen (TYLENOL) 325 MG tablet, Take 650 mg by mouth every 6 (six) hours as needed., Disp: , Rfl:  .  aspirin EC 81 MG tablet, Take 1 tablet (81 mg total) by mouth daily., Disp: , Rfl:  .  glucose blood (ONE TOUCH ULTRA TEST) test strip, USE ONE STRIP TO CHECK GLUCOSE ONCE DAILY, Disp: 100 each, Rfl: 12 .  latanoprost (XALATAN) 0.005 % ophthalmic solution, Place 1 drop into both eyes at bedtime. , Disp: , Rfl:  .  levothyroxine (SYNTHROID, LEVOTHROID) 50 MCG tablet, TAKE 1 TABLET BY MOUTH ONCE DAILY, Disp: 90 tablet, Rfl: 3 .  metFORMIN (GLUCOPHAGE) 1000 MG tablet, Take 1 tablet (1,000 mg total) by mouth daily with breakfast., Disp: 90 tablet, Rfl: 3 .  metoprolol tartrate (LOPRESSOR) 25 MG tablet, Take 12.5 mg by mouth 2 (two) times daily., Disp: , Rfl:  .  NON FORMULARY, Metoprolol 50 mg qd., Disp: , Rfl:  .  ONE TOUCH ULTRA TEST test strip, USE TO TEST BLOOD SUGAR ONCE DAILY, Disp: 50 each, Rfl: 25 .  ONETOUCH DELICA LANCETS 17P MISC, USE ONE LANCET TO CHECK BLOOD GLUCOSE ONCE DAILY, Disp: 100 each, Rfl: 12 .  ONETOUCH DELICA LANCETS 10C MISC, USE TO TEST BLOOD SUGAR ONCE DAILY, Disp: 100 each, Rfl: 12 .  pantoprazole (PROTONIX) 40 MG tablet, Take 1 tablet (40 mg total) by mouth 2 (two) times daily., Disp: 60 tablet, Rfl: 3 .  simvastatin (ZOCOR) 40 MG tablet, TAKE 1/2 (ONE-HALF) TABLET BY MOUTH AT BEDTIME, Disp: 45 tablet, Rfl: 3 .  warfarin (COUMADIN) 3 MG  tablet, TAKE 1 TABLET BY MOUTH ONCE DAILY AT  6PM, Disp: 90 tablet, Rfl: 0  Past Medical History: Past Medical History:  Diagnosis Date  . Atherosclerosis of aorta ([place])    by CT scan in past  . Atrial fibrillation ([place])    and aflutter. pt has a left atrial circuit that is not ablated. was on amiodarone-stopped, now use rate control.   . Bladder cancer ([place])    hx; treated with BCG in past   . Carotid artery disease ([place])    There was calcified plaque but no stenosis by carotid artery screening done at [facility] October, 2013  . CHB (complete heart block) ([place])    a. 05/2018 s/p BSX VVIR PPM ([facility]) in setting of bradycardia following myomectomy and AVR/MVR.  . Diabetes mellitus    type not specified  . Diabetic neuropathy (HCC)    feet  . Elevated liver enzymes    over time; hx  . Excessive sweating   . Glaucoma   . Gout   . Head injury    when slipped on ice 2004-2005. stabilized and back on Coumadin   . Headache    migraines - distant past  . History of cardiac cath    a. 05/2018 Cath ([facility]): Non-obstructive CAD. Mildly elevated filling pressures w/ nl CI.  [name] HOCM (hypertrophic obstructive cardiomyopathy) (  [place])    a. 01/2016 Echo: EF 65-70%, no rwma, LVOT gradient of 80-23mHg, mod AS, SAM; b. 11/2017 Echo: EF 55-60%, near cavity obliteration in systole, mod AS, mild MS; c. 04/2018 Card MRI ([facility]): Sev LVH, EF >70%, Sev AS, LVOT obs w/ Sev MR, mild to mod TR/PR, mid-myocardial HE in basal-mid anteroseptum and inferoseptum; d. 05/2018 s/p Septal myomectomy; e. 05/2018 Echo: Nl EF w/ sev LVH. Mech AoV and MV.  . [place]    elevated, mild   . Hypercholesterolemia    treated.   . Hypertension   . Kidney mass    laproscopic surgery woth cryoablation of [facility]   . Moderate aortic stenosis    a. 01/2016 echo: mod AS.   .Marland KitchenMotion sickness    ocean boats  . Nausea   . Obstructive sleep apnea    CPAP started successfully 2014   . Orthostatic hypotension    a. orthostatic. dehydration. hospitalized 11/11  . Sleep apnea    Significant obstructive sleep apnea diagnosed in August, 2012, the patient is to receive CPAP   . Stroke ([facility]    "2 old strokes" CT and MRI. [place] [facility] 11/11. diagnosis was dehydration, no acutal reports.   . TSH elevation    on synthroid historically   . Unsteady gait    August, 2012    Tobacco Use: Social History   Tobacco Use  Smoking Status Former Smoker  . Types: Cigars, Cigarettes  . Last attempt to quit: 10/02/1974  . Years since quitting: 44.0  Smokeless Tobacco Never Used  Tobacco Comment   still smokes cigars once a day    Labs: Recent Review Flowsheet Data    Labs for ITP Cardiac and Pulmonary Rehab Latest Ref Rng & Units 12/10/2016 03/30/2017 08/05/2017 11/12/2017 07/10/2018   Cholestrol 100 - 199 mg/dL 168 137 - 147 -   LDLCALC 0 - 99 mg/dL 96 70 - 82 -   HDL >39 mg/dL 41 36 - 41 -   Trlycerides 0 - 149 mg/dL 155(H) 153 - 120 -   Hemoglobin A1c 4.8 - 5.6 % 6.6(H) 7.4 6.5 6.3(H) 5.5       Exercise Target Goals: Exercise Program Goal: Individual exercise prescription set using results from initial 6 min walk test and THRR while considering  patient's activity barriers and safety.   Exercise Prescription Goal: Initial exercise prescription builds to 30-45 minutes a day of aerobic activity, 2-3 days per week.  Home exercise guidelines will be given to patient during program as part of exercise prescription that the participant will acknowledge.  Activity Barriers & Risk Stratification: Activity Barriers & Cardiac Risk Stratification - 09/22/18 1341      Activity Barriers & Cardiac Risk Stratification   Activity Barriers  Decreased Ventricular Function;Arthritis;Deconditioning;Muscular Weakness   left knee pain from dislocation in college   Cardiac Risk Stratification  High       6 Minute Walk: 6 Minute Walk    Row Name 09/22/18 1352         6 Minute  Walk   Phase  Initial     Distance  1015 feet     Walk Time  6 minutes     MPH  1.92     METS  2     RPE  13     Perceived Dyspnea   1     VO2 Peak  7.01     Symptoms  Yes (comment)  Comments  SOB, unsteady gait in shoes     Resting HR  80 bpm     Resting BP  130/70     Resting Oxygen Saturation   98 %     Exercise Oxygen Saturation  during 6 min walk  94 %     Max Ex. HR  107 bpm     Max Ex. BP  156/76     2 Minute Post BP  134/64        Oxygen Initial Assessment:   Oxygen Re-Evaluation:   Oxygen Discharge (Final Oxygen Re-Evaluation):   Initial Exercise Prescription: Initial Exercise Prescription - 09/22/18 1400      Date of Initial Exercise RX and Referring Provider   Date  09/22/18    Referring Provider  Kathlyn Sacramento MD      Treadmill   MPH  1.3    Grade  0.5    Minutes  15    METs  2.09      NuStep   Level  1    SPM  80    Minutes  15    METs  2      Recumbant Elliptical   Level  1    RPM  50    Minutes  15    METs  2      Prescription Details   Frequency (times per week)  3    Duration  Progress to 45 minutes of aerobic exercise without signs/symptoms of physical distress      Intensity   THRR 40-80% of Max Heartrate  103-126    Ratings of Perceived Exertion  11-13    Perceived Dyspnea  0-4      Progression   Progression  Continue to progress workloads to maintain intensity without signs/symptoms of physical distress.      Resistance Training   Training Prescription  Yes    Weight  3 lbs    Reps  10-15       Perform Capillary Blood Glucose checks as needed.  Exercise Prescription Changes: Exercise Prescription Changes    Row Name 09/22/18 1400 10/07/18 0900           Response to Exercise   Blood Pressure (Admit)  130/70  130/80      Blood Pressure (Exercise)  156/76  148/64      Blood Pressure (Exit)  134/64  150/80      Heart Rate (Admit)  80 bpm  75 bpm      Heart Rate (Exercise)  100 bpm  130 bpm      Heart Rate  (Exit)  76 bpm  73 bpm      Oxygen Saturation (Admit)  98 %  -      Oxygen Saturation (Exercise)  94 %  -      Rating of Perceived Exertion (Exercise)  13  15      Perceived Dyspnea (Exercise)  1  -      Symptoms  SOB, usteady shoes, wobbly gait  none      Comments  walk test results  PM changed to 70      Duration  -  Progress to 45 minutes of aerobic exercise without signs/symptoms of physical distress      Intensity  -  THRR unchanged        Progression   Progression  -  Continue to progress workloads to maintain intensity without signs/symptoms of physical distress.      Average  METs  -  1.76        Resistance Training   Training Prescription  -  Yes      Weight  -  3 lb      Reps  -  10-15        Treadmill   MPH  -  1.3      Grade  -  0.5      Minutes  -  15      METs  -  2.09        NuStep   Level  -  1      SPM  -  80      Minutes  -  15      METs  -  1.7        Recumbant Elliptical   Level  -  1      RPM  -  50      Minutes  -  15      METs  -  1.5         Exercise Comments: Exercise Comments    Row Name 09/24/18 1633           Exercise Comments  irst full day of exercise!  Patient was oriented to gym and equipment including functions, settings, policies, and procedures.  Patient's individual exercise prescription and treatment plan were reviewed.  All starting workloads were established based on the results of the 6 minute walk test done at initial orientation visit.  The plan for exercise progression was also introduced and progression will be customized based on patient's performance and goals.          Exercise Goals and Review: Exercise Goals    Row Name 09/22/18 1452             Exercise Goals   Increase Physical Activity  Yes       Intervention  Provide advice, education, support and counseling about physical activity/exercise needs.;Develop an individualized exercise prescription for aerobic and resistive training based on initial evaluation  findings, risk stratification, comorbidities and participant's personal goals.       Expected Outcomes  Short Term: Attend rehab on a regular basis to increase amount of physical activity.;Long Term: Add in home exercise to make exercise part of routine and to increase amount of physical activity.;Long Term: Exercising regularly at least 3-5 days a week.       Increase Strength and Stamina  Yes       Intervention  Provide advice, education, support and counseling about physical activity/exercise needs.;Develop an individualized exercise prescription for aerobic and resistive training based on initial evaluation findings, risk stratification, comorbidities and participant's personal goals.       Expected Outcomes  Short Term: Increase workloads from initial exercise prescription for resistance, speed, and METs.;Short Term: Perform resistance training exercises routinely during rehab and add in resistance training at home;Long Term: Improve cardiorespiratory fitness, muscular endurance and strength as measured by increased METs and functional capacity (6MWT)       Able to understand and use rate of perceived exertion (RPE) scale  Yes       Intervention  Provide education and explanation on how to use RPE scale       Expected Outcomes  Short Term: Able to use RPE daily in rehab to express subjective intensity level;Long Term:  Able to use RPE to guide intensity level when exercising independently       Able to  understand and use Dyspnea scale  Yes       Intervention  Provide education and explanation on how to use Dyspnea scale       Expected Outcomes  Short Term: Able to use Dyspnea scale daily in rehab to express subjective sense of shortness of breath during exertion;Long Term: Able to use Dyspnea scale to guide intensity level when exercising independently       Knowledge and understanding of Target Heart Rate Range (THRR)  Yes       Intervention  Provide education and explanation of THRR including how  the numbers were predicted and where they are located for reference       Expected Outcomes  Short Term: Able to state/look up THRR;Short Term: Able to use daily as guideline for intensity in rehab;Long Term: Able to use THRR to govern intensity when exercising independently       Able to check pulse independently  Yes       Intervention  Provide education and demonstration on how to check pulse in carotid and radial arteries.;Review the importance of being able to check your own pulse for safety during independent exercise       Expected Outcomes  Short Term: Able to explain why pulse checking is important during independent exercise;Long Term: Able to check pulse independently and accurately       Understanding of Exercise Prescription  Yes       Intervention  Provide education, explanation, and written materials on patient's individual exercise prescription       Expected Outcomes  Long Term: Able to explain home exercise prescription to exercise independently;Short Term: Able to explain program exercise prescription          Exercise Goals Re-Evaluation : Exercise Goals Re-Evaluation    [place] Name 10/07/18 0950             Exercise Goal Re-Evaluation   Exercise Goals Review  Increase Physical Activity;Increase Strength and Stamina;Able to understand and use rate of perceived exertion (RPE) scale;Knowledge and understanding of Target Heart Rate Range (THRR);Understanding of Exercise Prescription       Comments  Mattew has tolerated exercise well.  Staff will monitor progress.        Expected Outcomes  Short - increase levels on NS and XR Long - improve overall MET level          Discharge Exercise Prescription (Final Exercise Prescription Changes): Exercise Prescription Changes - 10/07/18 0900      Response to Exercise   Blood Pressure (Admit)  130/80    Blood Pressure (Exercise)  148/64    Blood Pressure (Exit)  150/80    Heart Rate (Admit)  75 bpm    Heart Rate (Exercise)  130 bpm     Heart Rate (Exit)  73 bpm    Rating of Perceived Exertion (Exercise)  15    Symptoms  none    Comments  PM changed to 70    Duration  Progress to 45 minutes of aerobic exercise without signs/symptoms of physical distress    Intensity  THRR unchanged      Progression   Progression  Continue to progress workloads to maintain intensity without signs/symptoms of physical distress.    Average METs  1.76      Resistance Training   Training Prescription  Yes    Weight  3 lb    Reps  10-15      Treadmill   MPH  1.3  Grade  0.5    Minutes  15    METs  2.09      NuStep   Level  1    SPM  80    Minutes  15    METs  1.7      Recumbant Elliptical   Level  1    RPM  50    Minutes  15    METs  1.5       Nutrition:  Target Goals: Understanding of nutrition guidelines, daily intake of sodium <1517m, cholesterol <2060m calories 30% from fat and 7% or less from saturated fats, daily to have 5 or more servings of fruits and vegetables.  Biometrics: Pre Biometrics - 09/22/18 1453      Pre Biometrics   Height  5' 8.4" (1.737 m)    Weight  186 lb 14.4 oz (84.8 kg)    Waist Circumference  40 inches    Hip Circumference  40.5 inches    Waist to Hip Ratio  0.99 %    BMI (Calculated)  28.1    Single Leg Stand  15.44 seconds        Nutrition Therapy Plan and Nutrition Goals: Nutrition Therapy & Goals - 09/25/18 1044      Nutrition Therapy   Diet  DM    Drug/Food Interactions  Coumadin/Vit K    Protein (specify units)  10oz    Fiber  30 grams    Whole Grain Foods  3 servings   eats white/wheat bread    Saturated Fats  13 max. grams    Fruits and Vegetables  5 servings/day   8 ideal; eats small portions but does try to eat fruits and vegetables daily   Sodium  2000 grams      Personal Nutrition Goals   Nutrition Goal  Drink one additional glass of water per day    Personal Goal #2  Use 1/2 of a cup as as guide when portioning out foods that contain Vitamin K so that  you eat the same amounts each time    Personal Goal #3  Start to think about ways to decrease sodium in your diet    Comments  His wife is responsible for cooking and providing most food for pt. His appetite is improving post surgery and wt is returning to UBW range after losing approx 8# per wife's estimation. (06/2018) HgbA1c 5.5. Breakfast: almond honey bunches of oats with 2% milk + dried cherries or other fruit, sometimes a biscuit. Lunch: "main meal" baked potato, beans, chili beans, ham or egg salad sandwich. Dinner: soup, sandwich, nuts, cheese, mini bagel with cream cheese, casserol occasionlly (prepared by daughter). Snacks: sweets, pretzels, chocolate. They do not eat many fried foods and he is trying to cut down on sweets.      Intervention Plan   Intervention  Prescribe, educate and counsel regarding individualized specific dietary modifications aiming towards targeted core components such as weight, hypertension, lipid management, diabetes, heart failure and other comorbidities.    Expected Outcomes  Short Term Goal: Understand basic principles of dietary content, such as calories, fat, sodium, cholesterol and nutrients.;Long Term Goal: Adherence to prescribed nutrition plan.;Short Term Goal: A plan has been developed with personal nutrition goals set during dietitian appointment.       Nutrition Assessments: Nutrition Assessments - 09/22/18 1341      MEDFICTS Scores   Pre Score  47       Nutrition Goals Re-Evaluation: Nutrition Goals Re-Evaluation  [place] Name 09/25/18 1104             Goals   Nutrition Goal  Use 1/2 of a cup as as guide when portioning out foods that contain Vitamin K so that you eat the same amounts each time       Comment  He is on Coumadin and is having trouble regularing his INR       Expected Outcome  He will find an eating schedule for and choose regulated portions of foods that are high in Vitamin K         Personal Goal #2 Re-Evaluation    Personal Goal #2  Drink one additional glass of water per day         Personal Goal #3 Re-Evaluation   Personal Goal #3  Start to think about ways to decrease sodium in your diet          Nutrition Goals Discharge (Final Nutrition Goals Re-Evaluation): Nutrition Goals Re-Evaluation - 09/25/18 1104      Goals   Nutrition Goal  Use 1/2 of a cup as as guide when portioning out foods that contain Vitamin K so that you eat the same amounts each time    Comment  He is on Coumadin and is having trouble regularing his INR    Expected Outcome  He will find an eating schedule for and choose regulated portions of foods that are high in Vitamin K      Personal Goal #2 Re-Evaluation   Personal Goal #2  Drink one additional glass of water per day      Personal Goal #3 Re-Evaluation   Personal Goal #3  Start to think about ways to decrease sodium in your diet       Psychosocial: Target Goals: Acknowledge presence or absence of significant depression and/or stress, maximize coping skills, provide positive support system. Participant is able to verbalize types and ability to use techniques and skills needed for reducing stress and depression.   Initial Review & Psychosocial Screening: Initial Psych Review & Screening - 09/22/18 1338      Initial Review   Current issues with  Current Anxiety/Panic;Current Stress Concerns   current health issues   Source of Stress Concerns  Financial;Family;Unable to participate in former interests or hobbies;Unable to perform yard/household activities      [place]?  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      Screening Interventions   Interventions  Encouraged to exercise;Program counselor consult;To provide support and  resources with identified psychosocial needs;Provide feedback about the scores to participant    Expected Outcomes  Short Term goal: Utilizing psychosocial counselor, staff and physician to assist with identification of specific Stressors or current issues interfering with healing process. Setting desired goal for each stressor or current issue identified.;Long Term Goal: Stressors or current issues are controlled or eliminated.;Short Term goal: Identification and review with participant of any Quality of Life or Depression concerns found by scoring the questionnaire.;Long Term goal: The participant improves quality of Life and PHQ9 Scores as seen by post scores and/or verbalization of changes       Quality of Life Scores:  Quality of Life - 09/22/18 1340      Quality of Life   Select  Quality of Life      Quality of Life Scores   Health/Function Pre  23.14 %    Socioeconomic Pre  26.36 %    Psych/Spiritual Pre  28.29 %    Family Pre  30 %    GLOBAL Pre  28.95 %      Scores of 19 and below usually indicate a poorer quality of life in these areas.  A difference of  2-3 points is a clinically meaningful difference.  A difference of 2-3 points in the total score of the Quality of Life Index has been associated with significant improvement in overall quality of life, self-image, physical symptoms, and general health in studies assessing change in quality of life.  PHQ-9: Recent Review Flowsheet Data    Depression screen [facility] 2/9 09/22/2018 07/08/2018 11/11/2017 11/11/2017 10/30/2016   Decreased Interest 0 0 0 0 0   Down, Depressed, Hopeless 0 0 0 0 0   PHQ - 2 Score 0 0 0 0 0   Altered sleeping 0 - 0 - -   Tired, decreased energy 1 - 0 - -   Change in appetite 0 - 0 - -   Feeling bad or failure about yourself  0 - 0 - -   Trouble concentrating 0 - 0 - -   Moving slowly or fidgety/restless 0 - 0 - -   Suicidal thoughts 0 - 0 - -   PHQ-9 Score 1 - 0 - -   Difficult doing work/chores Not  difficult at all - Not difficult at all - -     Interpretation of Total Score  Total Score Depression Severity:  1-4 = Minimal depression, 5-9 = Mild depression, 10-14 = Moderate depression, 15-19 = Moderately severe depression, 20-27 = Severe depression   Psychosocial Evaluation and Intervention:   Psychosocial Re-Evaluation:   Psychosocial Discharge (Final Psychosocial Re-Evaluation):   Vocational Rehabilitation: Provide vocational rehab assistance to qualifying candidates.   Vocational Rehab Evaluation & Intervention: Vocational Rehab - 09/22/18 1307      Initial Vocational Rehab Evaluation & Intervention   Assessment shows need for Vocational Rehabilitation  No       Education: Education Goals: Education classes will be provided on a variety of topics geared toward better understanding of heart health and risk factor modification. Participant will state understanding/return demonstration of topics presented as noted by education test scores.  Learning Barriers/Preferences: Learning Barriers/Preferences - 09/22/18 1306      Learning Barriers/Preferences   Learning Barriers  Hearing    Learning Preferences  None       Education Topics:  AED/CPR: - Group verbal and written instruction with the use of models to demonstrate the basic use of the AED with the basic ABC's of resuscitation.   Cardiac Rehab from 10/13/2018 in [facility] Cardiac and Pulmonary Rehab  Date  10/13/18  Educator  CE  Instruction Review Code  1- Verbalizes Understanding      General Nutrition Guidelines/Fats and Fiber: -Group instruction provided by verbal, written material, models and posters to present the general guidelines for heart healthy nutrition. Gives an explanation and review of dietary fats and fiber.   Controlling Sodium/Reading Food Labels: -Group verbal and written material supporting the discussion of sodium use in heart healthy nutrition. Review and explanation with models, verbal  and written materials for utilization of the food label.   Exercise Physiology & General Exercise Guidelines: - Group verbal and written instruction with models to review the exercise physiology of the  cardiovascular system and associated critical values. Provides general exercise guidelines with specific guidelines to those with heart or lung disease.    Aerobic Exercise & Resistance Training: - Gives group verbal and written instruction on the various components of exercise. Focuses on aerobic and resistive training programs and the benefits of this training and how to safely progress through these programs..   Flexibility, Balance, Mind/Body Relaxation: Provides group verbal/written instruction on the benefits of flexibility and balance training, including mind/body exercise modes such as yoga, pilates and tai chi.  Demonstration and skill practice provided.   Stress and Anxiety: - Provides group verbal and written instruction about the health risks of elevated stress and causes of high stress.  Discuss the correlation between heart/lung disease and anxiety and treatment options. Review healthy ways to manage with stress and anxiety.   Depression: - Provides group verbal and written instruction on the correlation between heart/lung disease and depressed mood, treatment options, and the stigmas associated with seeking treatment.   Cardiac Rehab from 10/13/2018 in [facility] Cardiac and Pulmonary Rehab  Date  09/24/18  Educator  Lucianne Lei, MSW  Instruction Review Code  2- Demonstrated Understanding      Anatomy & Physiology of the Heart: - Group verbal and written instruction and models provide basic cardiac anatomy and physiology, with the coronary electrical and arterial systems. Review of Valvular disease and Heart Failure   Cardiac Procedures: - Group verbal and written instruction to review commonly prescribed medications for heart disease. Reviews the medication, class of the  drug, and side effects. Includes the steps to properly store meds and maintain the prescription regimen. (beta blockers and nitrates)   Cardiac Medications I: - Group verbal and written instruction to review commonly prescribed medications for heart disease. Reviews the medication, class of the drug, and side effects. Includes the steps to properly store meds and maintain the prescription regimen.   Cardiac Medications II: -Group verbal and written instruction to review commonly prescribed medications for heart disease. Reviews the medication, class of the drug, and side effects. (all other drug classes)    Go Sex-Intimacy & Heart Disease, Get SMART - Goal Setting: - Group verbal and written instruction through game format to discuss heart disease and the return to sexual intimacy. Provides group verbal and written material to discuss and apply goal setting through the application of the S.M.A.R.T. Method.   Other Matters of the Heart: - Provides group verbal, written materials and models to describe Stable Angina and Peripheral Artery. Includes description of the disease process and treatment options available to the cardiac patient.   Exercise & Equipment Safety: - Individual verbal instruction and demonstration of equipment use and safety with use of the equipment.   Cardiac Rehab from 10/13/2018 in [place] Cardiac and Pulmonary Rehab  Date  09/22/18  Educator  [facility]  Instruction Review Code  1- Verbalizes Understanding      Infection Prevention: - Provides verbal and written material to individual with discussion of infection control including proper hand washing and proper equipment cleaning during exercise session.   Cardiac Rehab from 10/13/2018 in [facility] Cardiac and Pulmonary Rehab  Date  09/22/18  Educator  [facility]  Instruction Review Code  1- Verbalizes Understanding      Falls Prevention: - Provides verbal and written material to individual with discussion of falls prevention and  safety.   Cardiac Rehab from 10/13/2018 in [facility] Cardiac and Pulmonary Rehab  Date  09/22/18  Educator  [facility]  Instruction Review Code  1- Verbalizes Understanding      Diabetes: - Individual verbal and written instruction to review signs/symptoms of diabetes, desired ranges of glucose level fasting, after meals and with exercise. Acknowledge that pre and post exercise glucose checks will be done for 3 sessions at entry of program.   Cardiac Rehab from 10/13/2018 in [facility] Cardiac and Pulmonary Rehab  Date  09/22/18  Educator  [facility]  Instruction Review Code  1- Verbalizes Understanding      Know Your Numbers and Risk Factors: -Group verbal and written instruction about important numbers in your health.  Discussion of what are risk factors and how they play a role in the disease process.  Review of Cholesterol, Blood Pressure, Diabetes, and BMI and the role they play in your overall health.   Sleep Hygiene: -Provides group verbal and written instruction about how sleep can affect your health.  Define sleep hygiene, discuss sleep cycles and impact of sleep habits. Review good sleep hygiene tips.    Other: -Provides group and verbal instruction on various topics (see comments)   Knowledge Questionnaire Score: Knowledge Questionnaire Score - 09/22/18 1306      Knowledge Questionnaire Score   Pre Score  21/25   correct answers reviewed with [place]. Focus on anatomy, nutrition, exercise      Core Components/Risk Factors/Patient Goals at Admission: Personal Goals and Risk Factors at Admission - 09/22/18 1305      Core Components/Risk Factors/Patient Goals on Admission    Weight Management  Yes;Weight Maintenance    Intervention  Weight Management: Develop a combined nutrition and exercise program designed to reach desired caloric intake, while maintaining appropriate intake of nutrient and fiber, sodium and fats, and appropriate energy expenditure required for the weight goal.;Weight  Management: Provide education and appropriate resources to help participant work on and attain dietary goals.    Admit Weight  185 lb (83.9 kg)    Expected Outcomes  Short Term: Continue to assess and modify interventions until short term weight is achieved;Long Term: Adherence to nutrition and physical activity/exercise program aimed toward attainment of established weight goal;Weight Maintenance: Understanding of the daily nutrition guidelines, which includes 25-35% calories from fat, 7% or less cal from saturated fats, less than 259m cholesterol, less than 1.5gm of sodium, & 5 or more servings of fruits and vegetables daily;Understanding recommendations for meals to include 15-35% energy as protein, 25-35% energy from fat, 35-60% energy from carbohydrates, less than 2066mof dietary cholesterol, 20-35 gm of total fiber daily;Understanding of distribution of calorie intake throughout the day with the consumption of 4-5 meals/snacks    Diabetes  Yes    Intervention  Provide education about signs/symptoms and action to take for hypo/hyperglycemia.;Provide education about proper nutrition, including hydration, and aerobic/resistive exercise prescription along with prescribed medications to achieve blood glucose in normal ranges: Fasting glucose 65-99 mg/dL    Expected Outcomes  Short Term: Participant verbalizes understanding of the signs/symptoms and immediate care of hyper/hypoglycemia, proper foot care and importance of medication, aerobic/resistive exercise and nutrition plan for blood glucose control.;Long Term: Attainment of HbA1C < 7%.    Lipids  Yes    Intervention  Provide education and support for participant on nutrition & aerobic/resistive exercise along with prescribed medications to achieve LDL <7052mHDL >6m38m  Expected Outcomes  Short Term: Participant states understanding of desired cholesterol values and is compliant with medications prescribed. Participant is following exercise  prescription and nutrition guidelines.;Long Term: Cholesterol controlled with medications as prescribed, with individualized  exercise RX and with personalized nutrition plan. Value goals: LDL < 67m, HDL > 40 mg.       Core Components/Risk Factors/Patient Goals Review:    Core Components/Risk Factors/Patient Goals at Discharge (Final Review):    ITP Comments: ITP Comments    Row Name 09/22/18 1301 10/06/18 1649 10/14/18 0641       ITP Comments  Med Review completed. Initial ITP created. [facility]  Avari reports his PM has changed to on demand at 70 bpm  30 Day Review. Continue with ITP unless directed changes per Medical Director review        Comments:

## 2018-10-14 NOTE — Progress Notes (Signed)
Daily Session Note  Patient Details  Name: [name] [name] MRN: [id number] Date of Birth: 1934-01-23 Referring Provider:     Cardiac Rehab from 09/22/2018 in [facility] Cardiac and Pulmonary Rehab  Referring Provider  Kathlyn Sacramento MD      Encounter Date: 10/14/2018  Check In:      Social History   Tobacco Use  Smoking Status Former Smoker  . Types: Cigars, Cigarettes  . Last attempt to quit: 10/02/1974  . Years since quitting: 44.0  Smokeless Tobacco Never Used  Tobacco Comment   still smokes cigars once a day    Goals Met:    Goals Unmet:  error  Comments: cancel this note error should have been ITP   Dr. Emily Filbert is Medical Director for [place] and [facility].

## 2018-10-14 NOTE — Progress Notes (Signed)
Cardiac Individual Treatment Plan  Patient Details  Name: [name] M AntoniewiczMRN: [id number] Date of Birth: 02/08/1934 Referring Provider:     Cardiac Rehab from 09/22/2018 in [facility] Cardiac and Pulmonary Rehab  Referring Provider  Kathlyn Sacramento MD      Initial Encounter Date:    Cardiac Rehab from 09/22/2018 in [facility] Cardiac and Pulmonary Rehab  Date  09/22/18      Visit Diagnosis: S/P aortic valve replacement  S/P mitral valve replacement  Patient's Home Medications on Admission:  Current Outpatient Medications:  .  acetaminophen (TYLENOL) 325 MG tablet, Take 650 mg by mouth every 6 (six) hours as needed., Disp: , Rfl:  .  aspirin EC 81 MG tablet, Take 1 tablet (81 mg total) by mouth daily., Disp: , Rfl:  .  glucose blood (ONE TOUCH ULTRA TEST) test strip, USE ONE STRIP TO CHECK GLUCOSE ONCE DAILY, Disp: 100 each, Rfl: 12 .  latanoprost (XALATAN) 0.005 % ophthalmic solution, Place 1 drop into both eyes at bedtime. , Disp: , Rfl:  .  levothyroxine (SYNTHROID, LEVOTHROID) 50 MCG tablet, TAKE 1 TABLET BY MOUTH ONCE DAILY, Disp: 90 tablet, Rfl: 3 .  metFORMIN (GLUCOPHAGE) 1000 MG tablet, Take 1 tablet (1,000 mg total) by mouth daily with breakfast., Disp: 90 tablet, Rfl: 3 .  metoprolol tartrate (LOPRESSOR) 25 MG tablet, Take 12.5 mg by mouth 2 (two) times daily., Disp: , Rfl:  .  NON FORMULARY, Metoprolol 50 mg qd., Disp: , Rfl:  .  ONE TOUCH ULTRA TEST test strip, USE TO TEST BLOOD SUGAR ONCE DAILY, Disp: 50 each, Rfl: 25 .  ONETOUCH DELICA LANCETS 79K MISC, USE ONE LANCET TO CHECK BLOOD GLUCOSE ONCE DAILY, Disp: 100 each, Rfl: 12 .  ONETOUCH DELICA LANCETS 24O MISC, USE TO TEST BLOOD SUGAR ONCE DAILY, Disp: 100 each, Rfl: 12 .  pantoprazole (PROTONIX) 40 MG tablet, Take 1 tablet (40 mg total) by mouth 2 (two) times daily., Disp: 60 tablet, Rfl: 3 .  simvastatin (ZOCOR) 40 MG tablet, TAKE 1/2 (ONE-HALF) TABLET BY MOUTH AT BEDTIME, Disp: 45 tablet, Rfl: 3 .  warfarin (COUMADIN) 3 MG  tablet, TAKE 1 TABLET BY MOUTH ONCE DAILY AT  6PM, Disp: 90 tablet, Rfl: 0  Past Medical History: Past Medical History:  Diagnosis Date  . Atherosclerosis of aorta ([place])    by CT scan in past  . Atrial fibrillation ([place])    and aflutter. pt has a left atrial circuit that is not ablated. was on amiodarone-stopped, now use rate control.   . Bladder cancer ([place])    hx; treated with BCG in past   . Carotid artery disease ([place])    There was calcified plaque but no stenosis by carotid artery screening done at [facility] October, 2013  . CHB (complete heart block) ([place])    a. 05/2018 s/p BSX VVIR PPM ([facility]) in setting of bradycardia following myomectomy and AVR/MVR.  . Diabetes mellitus    type not specified  . Diabetic neuropathy (HCC)    feet  . Elevated liver enzymes    over time; hx  . Excessive sweating   . Glaucoma   . Gout   . Head injury    when slipped on ice 2004-2005. stabilized and back on Coumadin   . Headache    migraines - distant past  . History of cardiac cath    a. 05/2018 Cath ([facility]): Non-obstructive CAD. Mildly elevated filling pressures w/ nl CI.  [name] HOCM (hypertrophic obstructive cardiomyopathy) ([place])  a. 01/2016 Echo: EF 65-70%, no rwma, LVOT gradient of 80-70mHg, mod AS, SAM; b. 11/2017 Echo: EF 55-60%, near cavity obliteration in systole, mod AS, mild MS; c. 04/2018 Card MRI ([facility]): Sev LVH, EF >70%, Sev AS, LVOT obs w/ Sev MR, mild to mod TR/PR, mid-myocardial HE in basal-mid anteroseptum and inferoseptum; d. 05/2018 s/p Septal myomectomy; e. 05/2018 Echo: Nl EF w/ sev LVH. Mech AoV and MV.  . [place]    elevated, mild   . Hypercholesterolemia    treated.   . Hypertension   . Kidney mass    laproscopic surgery woth cryoablation of a mass outside [facility]   . Moderate aortic stenosis    a. 01/2016 echo: mod AS.   .Marland KitchenMotion sickness    ocean boats  . Nausea   . Obstructive sleep apnea    CPAP started successfully 2014   . Orthostatic hypotension    a. orthostatic. dehydration. hospitalized 11/11  . Sleep apnea    Significant obstructive sleep apnea diagnosed in August, 2012, the patient is to receive CPAP   . Stroke ([facility]    "2 old strokes" CT and MRI. [facility] 11/11. diagnosis was dehydration, no acutal reports.   . TSH elevation    on synthroid historically   . Unsteady gait    August, 2012    Tobacco Use: Social History   Tobacco Use  Smoking Status Former Smoker  . Types: Cigars, Cigarettes  . Last attempt to quit: 10/02/1974  . Years since quitting: 44.0  Smokeless Tobacco Never Used  Tobacco Comment   still smokes cigars once a day    Labs: Recent Review Flowsheet Data    Labs for ITP Cardiac and Pulmonary Rehab Latest Ref Rng & Units 12/10/2016 03/30/2017 08/05/2017 11/12/2017 07/10/2018   Cholestrol 100 - 199 mg/dL 168 137 - 147 -   LDLCALC 0 - 99 mg/dL 96 70 - 82 -   HDL >39 mg/dL 41 36 - 41 -   Trlycerides 0 - 149 mg/dL 155(H) 153 - 120 -   Hemoglobin A1c 4.8 - 5.6 % 6.6(H) 7.4 6.5 6.3(H) 5.5       Exercise Target Goals: Exercise Program Goal: Individual exercise prescription set using results from initial 6 min walk test and THRR while considering  patient's activity barriers and safety.   Exercise Prescription Goal: Initial exercise prescription builds to 30-45 minutes a day of aerobic activity, 2-3 days per week.  Home exercise guidelines will be given to patient during program as part of exercise prescription that the participant will acknowledge.  Activity Barriers & Risk Stratification: Activity Barriers & Cardiac Risk Stratification - 09/22/18 1341      Activity Barriers & Cardiac Risk Stratification   Activity Barriers  Decreased Ventricular Function;Arthritis;Deconditioning;Muscular Weakness   left knee pain from dislocation in college   Cardiac Risk Stratification  High       6 Minute Walk: 6 Minute Walk    Row Name 09/22/18 1352         6 Minute  Walk   Phase  Initial     Distance  1015 feet     Walk Time  6 minutes     MPH  1.92     METS  2     RPE  13     Perceived Dyspnea   1     VO2 Peak  7.01     Symptoms  Yes (comment)     Comments  SOB, unsteady gait in shoes     Resting HR  80 bpm     Resting BP  130/70     Resting Oxygen Saturation   98 %     Exercise Oxygen Saturation  during 6 min walk  94 %     Max Ex. HR  107 bpm     Max Ex. BP  156/76     2 Minute Post BP  134/64        Oxygen Initial Assessment:   Oxygen Re-Evaluation:   Oxygen Discharge (Final Oxygen Re-Evaluation):   Initial Exercise Prescription: Initial Exercise Prescription - 09/22/18 1400      Date of Initial Exercise RX and Referring Provider   Date  09/22/18    Referring Provider  Kathlyn Sacramento MD      Treadmill   MPH  1.3    Grade  0.5    Minutes  15    METs  2.09      NuStep   Level  1    SPM  80    Minutes  15    METs  2      Recumbant Elliptical   Level  1    RPM  50    Minutes  15    METs  2      Prescription Details   Frequency (times per week)  3    Duration  Progress to 45 minutes of aerobic exercise without signs/symptoms of physical distress      Intensity   THRR 40-80% of Max Heartrate  103-126    Ratings of Perceived Exertion  11-13    Perceived Dyspnea  0-4      Progression   Progression  Continue to progress workloads to maintain intensity without signs/symptoms of physical distress.      Resistance Training   Training Prescription  Yes    Weight  3 lbs    Reps  10-15       Perform Capillary Blood Glucose checks as needed.  Exercise Prescription Changes: Exercise Prescription Changes    Row Name 09/22/18 1400 10/07/18 0900           Response to Exercise   Blood Pressure (Admit)  130/70  130/80      Blood Pressure (Exercise)  156/76  148/64      Blood Pressure (Exit)  134/64  150/80      Heart Rate (Admit)  80 bpm  75 bpm      Heart Rate (Exercise)  100 bpm  130 bpm      Heart Rate  (Exit)  76 bpm  73 bpm      Oxygen Saturation (Admit)  98 %  -      Oxygen Saturation (Exercise)  94 %  -      Rating of Perceived Exertion (Exercise)  13  15      Perceived Dyspnea (Exercise)  1  -      Symptoms  SOB, usteady shoes, wobbly gait  none      Comments  walk test results  PM changed to 70      Duration  -  Progress to 45 minutes of aerobic exercise without signs/symptoms of physical distress      Intensity  -  THRR unchanged        Progression   Progression  -  Continue to progress workloads to maintain intensity without signs/symptoms of physical distress.      Average METs  -  1.76        Resistance Training   Training Prescription  -  Yes      Weight  -  3 lb      Reps  -  10-15        Treadmill   MPH  -  1.3      Grade  -  0.5      Minutes  -  15      METs  -  2.09        NuStep   Level  -  1      SPM  -  80      Minutes  -  15      METs  -  1.7        Recumbant Elliptical   Level  -  1      RPM  -  50      Minutes  -  15      METs  -  1.5         Exercise Comments: Exercise Comments    Row Name 09/24/18 1633           Exercise Comments  irst full day of exercise!  Patient was oriented to gym and equipment including functions, settings, policies, and procedures.  Patient's individual exercise prescription and treatment plan were reviewed.  All starting workloads were established based on the results of the 6 minute walk test done at initial orientation visit.  The plan for exercise progression was also introduced and progression will be customized based on patient's performance and goals.          Exercise Goals and Review: Exercise Goals    Row Name 09/22/18 1452             Exercise Goals   Increase Physical Activity  Yes       Intervention  Provide advice, education, support and counseling about physical activity/exercise needs.;Develop an individualized exercise prescription for aerobic and resistive training based on initial evaluation  findings, risk stratification, comorbidities and participant's personal goals.       Expected Outcomes  Short Term: Attend rehab on a regular basis to increase amount of physical activity.;Long Term: Add in home exercise to make exercise part of routine and to increase amount of physical activity.;Long Term: Exercising regularly at least 3-5 days a week.       Increase Strength and Stamina  Yes       Intervention  Provide advice, education, support and counseling about physical activity/exercise needs.;Develop an individualized exercise prescription for aerobic and resistive training based on initial evaluation findings, risk stratification, comorbidities and participant's personal goals.       Expected Outcomes  Short Term: Increase workloads from initial exercise prescription for resistance, speed, and METs.;Short Term: Perform resistance training exercises routinely during rehab and add in resistance training at home;Long Term: Improve cardiorespiratory fitness, muscular endurance and strength as measured by increased METs and functional capacity (6MWT)       Able to understand and use rate of perceived exertion (RPE) scale  Yes       Intervention  Provide education and explanation on how to use RPE scale       Expected Outcomes  Short Term: Able to use RPE daily in rehab to express subjective intensity level;Long Term:  Able to use RPE to guide intensity level when exercising independently       Able to understand and use Dyspnea  scale  Yes       Intervention  Provide education and explanation on how to use Dyspnea scale       Expected Outcomes  Short Term: Able to use Dyspnea scale daily in rehab to express subjective sense of shortness of breath during exertion;Long Term: Able to use Dyspnea scale to guide intensity level when exercising independently       Knowledge and understanding of Target Heart Rate Range (THRR)  Yes       Intervention  Provide education and explanation of THRR including how  the numbers were predicted and where they are located for reference       Expected Outcomes  Short Term: Able to state/look up THRR;Short Term: Able to use daily as guideline for intensity in rehab;Long Term: Able to use THRR to govern intensity when exercising independently       Able to check pulse independently  Yes       Intervention  Provide education and demonstration on how to check pulse in carotid and radial arteries.;Review the importance of being able to check your own pulse for safety during independent exercise       Expected Outcomes  Short Term: Able to explain why pulse checking is important during independent exercise;Long Term: Able to check pulse independently and accurately       Understanding of Exercise Prescription  Yes       Intervention  Provide education, explanation, and written materials on patient's individual exercise prescription       Expected Outcomes  Long Term: Able to explain home exercise prescription to exercise independently;Short Term: Able to explain program exercise prescription          Exercise Goals Re-Evaluation : Exercise Goals Re-Evaluation    [place] Name 10/07/18 0950             Exercise Goal Re-Evaluation   Exercise Goals Review  Increase Physical Activity;Increase Strength and Stamina;Able to understand and use rate of perceived exertion (RPE) scale;Knowledge and understanding of Target Heart Rate Range (THRR);Understanding of Exercise Prescription       Comments  [place] has tolerated exercise well.  Staff will monitor progress.        Expected Outcomes  Short - increase levels on NS and XR Long - improve overall MET level          Discharge Exercise Prescription (Final Exercise Prescription Changes): Exercise Prescription Changes - 10/07/18 0900      Response to Exercise   Blood Pressure (Admit)  130/80    Blood Pressure (Exercise)  148/64    Blood Pressure (Exit)  150/80    Heart Rate (Admit)  75 bpm    Heart Rate (Exercise)  130 bpm     Heart Rate (Exit)  73 bpm    Rating of Perceived Exertion (Exercise)  15    Symptoms  none    Comments  PM changed to 70    Duration  Progress to 45 minutes of aerobic exercise without signs/symptoms of physical distress    Intensity  THRR unchanged      Progression   Progression  Continue to progress workloads to maintain intensity without signs/symptoms of physical distress.    Average METs  1.76      Resistance Training   Training Prescription  Yes    Weight  3 lb    Reps  10-15      Treadmill   MPH  1.3    Grade  0.5  Minutes  15    METs  2.09      NuStep   Level  1    SPM  80    Minutes  15    METs  1.7      Recumbant Elliptical   Level  1    RPM  50    Minutes  15    METs  1.5       Nutrition:  Target Goals: Understanding of nutrition guidelines, daily intake of sodium <1553m, cholesterol <20107m calories 30% from fat and 7% or less from saturated fats, daily to have 5 or more servings of fruits and vegetables.  Biometrics: Pre Biometrics - 09/22/18 1453      Pre Biometrics   Height  5' 8.4" (1.737 m)    Weight  186 lb 14.4 oz (84.8 kg)    Waist Circumference  40 inches    Hip Circumference  40.5 inches    Waist to Hip Ratio  0.99 %    BMI (Calculated)  28.1    Single Leg Stand  15.44 seconds        Nutrition Therapy Plan and Nutrition Goals: Nutrition Therapy & Goals - 09/25/18 1044      Nutrition Therapy   Diet  DM    Drug/Food Interactions  Coumadin/Vit K    Protein (specify units)  10oz    Fiber  30 grams    Whole Grain Foods  3 servings   eats white/wheat bread    Saturated Fats  13 max. grams    Fruits and Vegetables  5 servings/day   8 ideal; eats small portions but does try to eat fruits and vegetables daily   Sodium  2000 grams      Personal Nutrition Goals   Nutrition Goal  Drink one additional glass of water per day    Personal Goal #2  Use 1/2 of a cup as as guide when portioning out foods that contain Vitamin K so that  you eat the same amounts each time    Personal Goal #3  Start to think about ways to decrease sodium in your diet    Comments  His wife is responsible for cooking and providing most food for pt. His appetite is improving post surgery and wt is returning to UBW range after losing approx 8# per wife's estimation. (06/2018) HgbA1c 5.5. Breakfast: almond honey bunches of oats with 2% milk + dried cherries or other fruit, sometimes a biscuit. Lunch: "main meal" baked potato, beans, chili beans, ham or egg salad sandwich. Dinner: soup, sandwich, nuts, cheese, mini bagel with cream cheese, casserol occasionlly (prepared by daughter). Snacks: sweets, pretzels, chocolate. They do not eat many fried foods and he is trying to cut down on sweets.      Intervention Plan   Intervention  Prescribe, educate and counsel regarding individualized specific dietary modifications aiming towards targeted core components such as weight, hypertension, lipid management, diabetes, heart failure and other comorbidities.    Expected Outcomes  Short Term Goal: Understand basic principles of dietary content, such as calories, fat, sodium, cholesterol and nutrients.;Long Term Goal: Adherence to prescribed nutrition plan.;Short Term Goal: A plan has been developed with personal nutrition goals set during dietitian appointment.       Nutrition Assessments: Nutrition Assessments - 09/22/18 1341      MEDFICTS Scores   Pre Score  47       Nutrition Goals Re-Evaluation: Nutrition Goals Re-Evaluation    [place] 09/25/18 1104  Goals   Nutrition Goal  Use 1/2 of a cup as as guide when portioning out foods that contain Vitamin K so that you eat the same amounts each time       Comment  He is on Coumadin and is having trouble regularing his INR       Expected Outcome  He will find an eating schedule for and choose regulated portions of foods that are high in Vitamin K         Personal Goal #2 Re-Evaluation    Personal Goal #2  Drink one additional glass of water per day         Personal Goal #3 Re-Evaluation   Personal Goal #3  Start to think about ways to decrease sodium in your diet          Nutrition Goals Discharge (Final Nutrition Goals Re-Evaluation): Nutrition Goals Re-Evaluation - 09/25/18 1104      Goals   Nutrition Goal  Use 1/2 of a cup as as guide when portioning out foods that contain Vitamin K so that you eat the same amounts each time    Comment  He is on Coumadin and is having trouble regularing his INR    Expected Outcome  He will find an eating schedule for and choose regulated portions of foods that are high in Vitamin K      Personal Goal #2 Re-Evaluation   Personal Goal #2  Drink one additional glass of water per day      Personal Goal #3 Re-Evaluation   Personal Goal #3  Start to think about ways to decrease sodium in your diet       Psychosocial: Target Goals: Acknowledge presence or absence of significant depression and/or stress, maximize coping skills, provide positive support system. Participant is able to verbalize types and ability to use techniques and skills needed for reducing stress and depression.   Initial Review & Psychosocial Screening: Initial Psych Review & Screening - 09/22/18 1338      Initial Review   Current issues with  Current Anxiety/Panic;Current Stress Concerns   current health issues   Source of Stress Concerns  Financial;Family;Unable to participate in former interests or hobbies;Unable to perform yard/household activities      [place]?  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      Screening Interventions   Interventions  Encouraged to exercise;Program counselor consult;To provide support and  resources with identified psychosocial needs;Provide feedback about the scores to participant    Expected Outcomes  Short Term goal: Utilizing psychosocial counselor, staff and physician to assist with identification of specific Stressors or current issues interfering with healing process. Setting desired goal for each stressor or current issue identified.;Long Term Goal: Stressors or current issues are controlled or eliminated.;Short Term goal: Identification and review with participant of any Quality of Life or Depression concerns found by scoring the questionnaire.;Long Term goal: The participant improves quality of Life and PHQ9 Scores as seen by post scores and/or verbalization of changes       Quality of Life Scores:  Quality of Life - 09/22/18 1340      Quality of Life   Select  Quality of Life      Quality of Life Scores   Health/Function  Pre  23.14 %    Socioeconomic Pre  26.36 %    Psych/Spiritual Pre  28.29 %    Family Pre  30 %    GLOBAL Pre  28.95 %      Scores of 19 and below usually indicate a poorer quality of life in these areas.  A difference of  2-3 points is a clinically meaningful difference.  A difference of 2-3 points in the total score of the Quality of Life Index has been associated with significant improvement in overall quality of life, self-image, physical symptoms, and general health in studies assessing change in quality of life.  PHQ-9: Recent Review Flowsheet Data    Depression screen [facility] 2/9 09/22/2018 07/08/2018 11/11/2017 11/11/2017 10/30/2016   Decreased Interest 0 0 0 0 0   Down, Depressed, Hopeless 0 0 0 0 0   PHQ - 2 Score 0 0 0 0 0   Altered sleeping 0 - 0 - -   Tired, decreased energy 1 - 0 - -   Change in appetite 0 - 0 - -   Feeling bad or failure about yourself  0 - 0 - -   Trouble concentrating 0 - 0 - -   Moving slowly or fidgety/restless 0 - 0 - -   Suicidal thoughts 0 - 0 - -   PHQ-9 Score 1 - 0 - -   Difficult doing work/chores Not  difficult at all - Not difficult at all - -     Interpretation of Total Score  Total Score Depression Severity:  1-4 = Minimal depression, 5-9 = Mild depression, 10-14 = Moderate depression, 15-19 = Moderately severe depression, 20-27 = Severe depression   Psychosocial Evaluation and Intervention:   Psychosocial Re-Evaluation:   Psychosocial Discharge (Final Psychosocial Re-Evaluation):   Vocational Rehabilitation: Provide vocational rehab assistance to qualifying candidates.   Vocational Rehab Evaluation & Intervention: Vocational Rehab - 09/22/18 1307      Initial Vocational Rehab Evaluation & Intervention   Assessment shows need for Vocational Rehabilitation  No       Education: Education Goals: Education classes will be provided on a variety of topics geared toward better understanding of heart health and risk factor modification. Participant will state understanding/return demonstration of topics presented as noted by education test scores.  Learning Barriers/Preferences: Learning Barriers/Preferences - 09/22/18 1306      Learning Barriers/Preferences   Learning Barriers  Hearing    Learning Preferences  None       Education Topics:  AED/CPR: - Group verbal and written instruction with the use of models to demonstrate the basic use of the AED with the basic ABC's of resuscitation.   Cardiac Rehab from 10/13/2018 in [facility] Cardiac and Pulmonary Rehab  Date  10/13/18  Educator  CE  Instruction Review Code  1- Verbalizes Understanding      General Nutrition Guidelines/Fats and Fiber: -Group instruction provided by verbal, written material, models and posters to present the general guidelines for heart healthy nutrition. Gives an explanation and review of dietary fats and fiber.   Controlling Sodium/Reading Food Labels: -Group verbal and written material supporting the discussion of sodium use in heart healthy nutrition. Review and explanation with models, verbal  and written materials for utilization of the food label.   Exercise Physiology & General Exercise Guidelines: - Group verbal and written instruction with models to review the exercise physiology of the cardiovascular system and associated critical values. Provides general exercise guidelines with specific guidelines to those  with heart or lung disease.    Aerobic Exercise & Resistance Training: - Gives group verbal and written instruction on the various components of exercise. Focuses on aerobic and resistive training programs and the benefits of this training and how to safely progress through these programs..   Flexibility, Balance, Mind/Body Relaxation: Provides group verbal/written instruction on the benefits of flexibility and balance training, including mind/body exercise modes such as yoga, pilates and tai chi.  Demonstration and skill practice provided.   Stress and Anxiety: - Provides group verbal and written instruction about the health risks of elevated stress and causes of high stress.  Discuss the correlation between heart/lung disease and anxiety and treatment options. Review healthy ways to manage with stress and anxiety.   Depression: - Provides group verbal and written instruction on the correlation between heart/lung disease and depressed mood, treatment options, and the stigmas associated with seeking treatment.   Cardiac Rehab from 10/13/2018 in [facility] Cardiac and Pulmonary Rehab  Date  09/24/18  Educator  Lucianne Lei, MSW  Instruction Review Code  2- Demonstrated Understanding      Anatomy & Physiology of the Heart: - Group verbal and written instruction and models provide basic cardiac anatomy and physiology, with the coronary electrical and arterial systems. Review of Valvular disease and Heart Failure   Cardiac Procedures: - Group verbal and written instruction to review commonly prescribed medications for heart disease. Reviews the medication, class of the  drug, and side effects. Includes the steps to properly store meds and maintain the prescription regimen. (beta blockers and nitrates)   Cardiac Medications I: - Group verbal and written instruction to review commonly prescribed medications for heart disease. Reviews the medication, class of the drug, and side effects. Includes the steps to properly store meds and maintain the prescription regimen.   Cardiac Medications II: -Group verbal and written instruction to review commonly prescribed medications for heart disease. Reviews the medication, class of the drug, and side effects. (all other drug classes)    Go Sex-Intimacy & Heart Disease, Get SMART - Goal Setting: - Group verbal and written instruction through game format to discuss heart disease and the return to sexual intimacy. Provides group verbal and written material to discuss and apply goal setting through the application of the S.M.A.R.T. Method.   Other Matters of the Heart: - Provides group verbal, written materials and models to describe Stable Angina and Peripheral Artery. Includes description of the disease process and treatment options available to the cardiac patient.   Exercise & Equipment Safety: - Individual verbal instruction and demonstration of equipment use and safety with use of the equipment.   Cardiac Rehab from 10/13/2018 in [facility] Cardiac and Pulmonary Rehab  Date  09/22/18  Educator  [facility]  Instruction Review Code  1- Verbalizes Understanding      Infection Prevention: - Provides verbal and written material to individual with discussion of infection control including proper hand washing and proper equipment cleaning during exercise session.   Cardiac Rehab from 10/13/2018 in [facility] Cardiac and Pulmonary Rehab  Date  09/22/18  Educator  [facility]  Instruction Review Code  1- Verbalizes Understanding      Falls Prevention: - Provides verbal and written material to individual with discussion of falls prevention and  safety.   Cardiac Rehab from 10/13/2018 in [facility] Cardiac and Pulmonary Rehab  Date  09/22/18  Educator  [facility]  Instruction Review Code  1- Verbalizes Understanding      Diabetes: - Individual verbal and written  instruction to review signs/symptoms of diabetes, desired ranges of glucose level fasting, after meals and with exercise. Acknowledge that pre and post exercise glucose checks will be done for 3 sessions at entry of program.   Cardiac Rehab from 10/13/2018 in [facility] Cardiac and Pulmonary Rehab  Date  09/22/18  Educator  [facility]  Instruction Review Code  1- Verbalizes Understanding      Know Your Numbers and Risk Factors: -Group verbal and written instruction about important numbers in your health.  Discussion of what are risk factors and how they play a role in the disease process.  Review of Cholesterol, Blood Pressure, Diabetes, and BMI and the role they play in your overall health.   Sleep Hygiene: -Provides group verbal and written instruction about how sleep can affect your health.  Define sleep hygiene, discuss sleep cycles and impact of sleep habits. Review good sleep hygiene tips.    Other: -Provides group and verbal instruction on various topics (see comments)   Knowledge Questionnaire Score: Knowledge Questionnaire Score - 09/22/18 1306      Knowledge Questionnaire Score   Pre Score  21/25   correct answers reviewed with Zyen. Focus on anatomy, nutrition, exercise      Core Components/Risk Factors/Patient Goals at Admission: Personal Goals and Risk Factors at Admission - 09/22/18 1305      Core Components/Risk Factors/Patient Goals on Admission    Weight Management  Yes;Weight Maintenance    Intervention  Weight Management: Develop a combined nutrition and exercise program designed to reach desired caloric intake, while maintaining appropriate intake of nutrient and fiber, sodium and fats, and appropriate energy expenditure required for the weight goal.;Weight  Management: Provide education and appropriate resources to help participant work on and attain dietary goals.    Admit Weight  185 lb (83.9 kg)    Expected Outcomes  Short Term: Continue to assess and modify interventions until short term weight is achieved;Long Term: Adherence to nutrition and physical activity/exercise program aimed toward attainment of established weight goal;Weight Maintenance: Understanding of the daily nutrition guidelines, which includes 25-35% calories from fat, 7% or less cal from saturated fats, less than 246m cholesterol, less than 1.5gm of sodium, & 5 or more servings of fruits and vegetables daily;Understanding recommendations for meals to include 15-35% energy as protein, 25-35% energy from fat, 35-60% energy from carbohydrates, less than 2068mof dietary cholesterol, 20-35 gm of total fiber daily;Understanding of distribution of calorie intake throughout the day with the consumption of 4-5 meals/snacks    Diabetes  Yes    Intervention  Provide education about signs/symptoms and action to take for hypo/hyperglycemia.;Provide education about proper nutrition, including hydration, and aerobic/resistive exercise prescription along with prescribed medications to achieve blood glucose in normal ranges: Fasting glucose 65-99 mg/dL    Expected Outcomes  Short Term: Participant verbalizes understanding of the signs/symptoms and immediate care of hyper/hypoglycemia, proper foot care and importance of medication, aerobic/resistive exercise and nutrition plan for blood glucose control.;Long Term: Attainment of HbA1C < 7%.    Lipids  Yes    Intervention  Provide education and support for participant on nutrition & aerobic/resistive exercise along with prescribed medications to achieve LDL <7055mHDL >46m20m  Expected Outcomes  Short Term: Participant states understanding of desired cholesterol values and is compliant with medications prescribed. Participant is following exercise  prescription and nutrition guidelines.;Long Term: Cholesterol controlled with medications as prescribed, with individualized exercise RX and with personalized nutrition plan. Value goals: LDL < 70mg32mL >  40 mg.       Core Components/Risk Factors/Patient Goals Review:    Core Components/Risk Factors/Patient Goals at Discharge (Final Review):    ITP Comments: ITP Comments    Row Name 09/22/18 1301 10/06/18 1649 10/14/18 0641       ITP Comments  Med Review completed. Initial ITP created. Diagnosis can be found in [facility] 8/19  Parley reports his PM has changed to on demand at 70 bpm  30 Day Review. Continue with ITP unless directed changes per Medical Director review        Comments:

## 2018-10-16 ENCOUNTER — Encounter: Payer: Self-pay | Admitting: Cardiovascular Disease

## 2018-10-16 ENCOUNTER — Ambulatory Visit (INDEPENDENT_AMBULATORY_CARE_PROVIDER_SITE_OTHER): Payer: PPO | Admitting: Cardiovascular Disease

## 2018-10-16 ENCOUNTER — Encounter: Payer: PPO | Attending: Cardiovascular Disease

## 2018-10-16 VITALS — BP 140/60 | HR 73 | Ht 68.0 in | Wt 191.2 lb

## 2018-10-16 DIAGNOSIS — M109 Gout, unspecified: Secondary | ICD-10-CM | POA: Insufficient documentation

## 2018-10-16 DIAGNOSIS — Z7982 Long term (current) use of aspirin: Secondary | ICD-10-CM | POA: Diagnosis not present

## 2018-10-16 DIAGNOSIS — E785 Hyperlipidemia, unspecified: Secondary | ICD-10-CM | POA: Diagnosis not present

## 2018-10-16 DIAGNOSIS — E1142 Type 2 diabetes mellitus with diabetic polyneuropathy: Secondary | ICD-10-CM | POA: Diagnosis not present

## 2018-10-16 DIAGNOSIS — Z79899 Other long term (current) drug therapy: Secondary | ICD-10-CM | POA: Diagnosis not present

## 2018-10-16 DIAGNOSIS — Z7901 Long term (current) use of anticoagulants: Secondary | ICD-10-CM | POA: Diagnosis not present

## 2018-10-16 DIAGNOSIS — I482 Chronic atrial fibrillation, unspecified: Secondary | ICD-10-CM | POA: Diagnosis not present

## 2018-10-16 DIAGNOSIS — I10 Essential (primary) hypertension: Secondary | ICD-10-CM | POA: Diagnosis not present

## 2018-10-16 DIAGNOSIS — Z952 Presence of prosthetic heart valve: Secondary | ICD-10-CM

## 2018-10-16 DIAGNOSIS — G4736 Sleep related hypoventilation in conditions classified elsewhere: Secondary | ICD-10-CM | POA: Diagnosis not present

## 2018-10-16 DIAGNOSIS — I421 Obstructive hypertrophic cardiomyopathy: Secondary | ICD-10-CM

## 2018-10-16 DIAGNOSIS — Z8551 Personal history of malignant neoplasm of bladder: Secondary | ICD-10-CM | POA: Insufficient documentation

## 2018-10-16 DIAGNOSIS — I493 Ventricular premature depolarization: Secondary | ICD-10-CM

## 2018-10-16 DIAGNOSIS — I700 Atherosclerosis of aorta: Secondary | ICD-10-CM | POA: Diagnosis not present

## 2018-10-16 DIAGNOSIS — Z79890 Hormone replacement therapy (postmenopausal): Secondary | ICD-10-CM | POA: Diagnosis not present

## 2018-10-16 DIAGNOSIS — Z87891 Personal history of nicotine dependence: Secondary | ICD-10-CM | POA: Diagnosis not present

## 2018-10-16 NOTE — Progress Notes (Signed)
Daily Session Note  Patient Details  Name: COLIE FUGITT MRN: [id number] Date of Birth: 1934-05-18 Referring Provider:     Cardiac Rehab from 09/22/2018 in [facility] Cardiac and Pulmonary Rehab  Referring Provider  Kathlyn Sacramento MD      Encounter Date: 10/16/2018  Check In: Session Check In - 10/16/18 1614      Check-In   Supervising physician immediately available to respond to emergencies  See telemetry face sheet for immediately available ER MD    Location  [facility]-Cardiac & Pulmonary Rehab    Staff Present  Renita Papa, RN BSN;Jeanna Durrell BS, Exercise Physiologist;Liston Thum Tessie Fass RCP,RRT,BSRT    Medication changes reported      No    Fall or balance concerns reported     No    Warm-up and Cool-down  Performed as group-led instruction    Resistance Training Performed  Yes    VAD Patient?  No    PAD/SET Patient?  No      Pain Assessment   Currently in Pain?  No/denies          Social History   Tobacco Use  Smoking Status Former Smoker  . Types: Cigars, Cigarettes  . Last attempt to quit: 10/02/1974  . Years since quitting: 44.0  Smokeless Tobacco Never Used  Tobacco Comment   still smokes cigars once a day    Goals Met:  Independence with exercise equipment Exercise tolerated well No report of cardiac concerns or symptoms Strength training completed today  Goals Unmet:  Not Applicable  Comments: Pt able to follow exercise prescription today without complaint.  Will continue to monitor for progression.    Dr. Emily Filbert is Medical Director for [facility] and [facility].

## 2018-10-16 NOTE — Patient Instructions (Signed)
Medication Instructions:  No changes If you need a refill on your cardiac medications before your next appointment, please call your pharmacy.   Lab work: None ordered  Testing/Procedures: None ordered  Follow-Up: At [facility], you and your health needs are our priority.  As part of our continuing mission to provide you with exceptional heart care, we have created designated Provider Care Teams.  These Care Teams include your primary Cardiologist (physician) and Advanced Practice Providers (APPs -  Physician Assistants and Nurse Practitioners) who all work together to provide you with the care you need, when you need it. You will need a follow up appointment in 4 months.  Please call our office 2 months in advance to schedule this appointment.  You may see Kathlyn Sacramento, MD or one of the following Advanced Practice Providers on your designated Care Team:   Murray Hodgkins, NP Christell Faith, PA-C

## 2018-10-16 NOTE — Progress Notes (Signed)
Cardiology Office Note   Date:  10/16/2018   ID:  [name] [name], [name] [name] 16-Apr-1934, MRN [id number]  PCP:  [name], MD  Cardiologist:   Kathlyn Sacramento, MD   Chief Complaint  Patient presents with  . other    2 month follow up. Meds reviewed by the pt. verbally. "doing well."       History of Present Illness: [name] [name] is a 85 y.o. adult who presents for for a follow-up visit regarding chronic atrial fibrillation, hypertrophic obstructive cardiomyopathy and aortic stenosis.  He was hospitalized in December, 2018 with pneumonia.  He improved with antibiotics.  He had issues with tachycardia and hypotension while hospitalized. The patient is status post septal myomectomy with mechanical AVR and MVR at [facility] in September, 2019.  Postoperative course was complicated by acute blood loss anemia requiring transfusion as well as pleural effusion requiring thoracentesis and complete heart block with placement of [employer]'s single lead permanent pacemaker.  His hospitalization was prolonged and ultimately when he was sent to rehab.  He was hospitalized at [facility] after that due to GI bleed in the setting of supratherapeutic INR.  EGD showed duodenal ulcer with visible vessel that was treated with hemo spray Echocardiogram in October showed an EF of 60 to 65%, intact mechanical aortic valve with a mean gradient of 8 mmHg and normal functioning mechanical mitral valve with a mean gradient of 6 mmHg.  Systolic pulmonary pressure was mildly elevated at 36 mmHg. He had PVCs that improved with resumption of small dose metoprolol.  He has been doing well overall with no recent chest pain, shortness of breath or significant palpitations.  He is attending cardiac rehab and is doing well there.  Past Medical History:  Diagnosis Date  . Atherosclerosis of aorta ([place])    by CT scan in past  . Atrial fibrillation ([place])    and aflutter. pt has a left atrial circuit that is not  ablated. was on amiodarone-stopped, now use rate control.   . Bladder cancer ([place])    hx; treated with BCG in past   . Carotid artery disease ([place])    There was calcified plaque but no stenosis by carotid artery screening done at [facility] October, 2013  . CHB (complete heart block) ([place])    a. 05/2018 s/p BSX VVIR PPM ([facility]) in setting of bradycardia following myomectomy and AVR/MVR.  . Diabetes mellitus    type not specified  . Diabetic neuropathy (HCC)    feet  . Elevated liver enzymes    over time; hx  . Excessive sweating   . Glaucoma   . Gout   . Head injury    when slipped on ice 2004-2005. stabilized and back on Coumadin   . Headache    migraines - distant past  . History of cardiac cath    a. 05/2018 Cath ([facility]): Non-obstructive CAD. Mildly elevated filling pressures w/ nl CI.  [name] HOCM (hypertrophic obstructive cardiomyopathy) ([place])    a. 01/2016 Echo: EF 65-70%, no rwma, LVOT gradient of 80-79mmHg, mod AS, SAM; b. 11/2017 Echo: EF 55-60%, near cavity obliteration in systole, mod AS, mild MS; c. 04/2018 Card MRI ([facility]): Sev LVH, EF >70%, Sev AS, LVOT obs w/ Sev MR, mild to mod TR/PR, mid-myocardial HE in basal-mid anteroseptum and inferoseptum; d. 05/2018 s/p Septal myomectomy; e. 05/2018 Echo: Nl EF w/ sev LVH. Mech AoV and MV.  . 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  Hypercholesterolemia    treated.   . Hypertension   . Kidney mass    laproscopic surgery woth cryoablation of a mass outside kidney followed at [facility].   . Moderate aortic stenosis    a. 01/2016 echo: mod AS.   [name] Motion sickness    ocean boats  . Nausea   . Obstructive sleep apnea    CPAP started successfully 2014  . Orthostatic hypotension    a. orthostatic. dehydration. hospitalized 11/11  . Sleep apnea    Significant obstructive sleep apnea diagnosed in August, 2012, the patient is to receive CPAP   . Stroke [facility])    "2 old strokes" CT and MRI. [place] [facility] 11/11. diagnosis was  dehydration, no acutal reports.   . TSH elevation    on synthroid historically   . Unsteady gait    August, 2012    Past Surgical History:  Procedure Laterality Date  . BLADDER SURGERY    . CATARACT EXTRACTION W/PHACO Left 05/11/2015   Procedure: CATARACT EXTRACTION PHACO AND INTRAOCULAR LENS PLACEMENT (IOC);  Surgeon: Leandrew Koyanagi, MD;  Location: [place];  Service: Ophthalmology;  Laterality: Left;  DIABETIC - oral meds, CPAP  . ESOPHAGOGASTRODUODENOSCOPY (EGD) WITH PROPOFOL N/A 07/01/2018   Procedure: ESOPHAGOGASTRODUODENOSCOPY (EGD) WITH PROPOFOL;  Surgeon: [name], MD;  Location: [facility] ENDOSCOPY;  Service: Endoscopy;  Laterality: N/A;  . INSERT / REPLACE / REMOVE PACEMAKER     Chemical engineer   . KIDNEY SURGERY     "froze mass"  . MECHANICAL AORTIC AND MITRAL VALVE REPLACEMENT  05/2018   [facility]   . TONSILLECTOMY    . VALVE REPLACEMENT       Current Outpatient Medications  Medication Sig Dispense Refill  . acetaminophen (TYLENOL) 325 MG tablet Take 650 mg by mouth every 6 (six) hours as needed.    [name] aspirin EC 81 MG tablet Take 1 tablet (81 mg total) by mouth daily.    [name] glucose blood (ONE TOUCH ULTRA TEST) test strip USE ONE STRIP TO CHECK GLUCOSE ONCE DAILY 100 each 12  . latanoprost (XALATAN) 0.005 % ophthalmic solution Place 1 drop into both eyes at bedtime.     [name] levothyroxine (SYNTHROID, LEVOTHROID) 50 MCG tablet TAKE 1 TABLET BY MOUTH ONCE DAILY 90 tablet 3  . metFORMIN (GLUCOPHAGE) 1000 MG tablet Take 1 tablet (1,000 mg total) by mouth daily with breakfast. 90 tablet 3  . metoprolol tartrate (LOPRESSOR) 25 MG tablet Take 12.5 mg by mouth 2 (two) times daily.    . NON FORMULARY Metoprolol 50 mg qd.    . ONE TOUCH ULTRA TEST test strip USE TO TEST BLOOD SUGAR ONCE DAILY 50 each 25  . ONETOUCH DELICA LANCETS 17P MISC USE ONE LANCET TO CHECK BLOOD GLUCOSE ONCE DAILY 100 each 12  . ONETOUCH DELICA LANCETS 10C MISC USE TO TEST BLOOD SUGAR ONCE DAILY 100  each 12  . pantoprazole (PROTONIX) 40 MG tablet Take 1 tablet (40 mg total) by mouth 2 (two) times daily. 60 tablet 3  . simvastatin (ZOCOR) 40 MG tablet TAKE 1/2 (ONE-HALF) TABLET BY MOUTH AT BEDTIME 45 tablet 3  . warfarin (COUMADIN) 3 MG tablet TAKE 1 TABLET BY MOUTH ONCE DAILY AT  6PM 90 tablet 0   No current facility-administered medications for this visit.     Allergies:   Macrolides and ketolides; Mycinettes; Nitrofuran derivatives; Nitrofurantoin; and Erythromycin    Social History:  The patient  reports that she quit smoking about 44 years ago. Her  smoking use included cigars and cigarettes. She has never used smokeless tobacco. She reports that she does not drink alcohol or use drugs.   Family History:  The patient's family history includes Arrhythmia in her brother and father; Breast cancer in her mother; Prostate cancer in her father; Stroke in her father.    ROS:  Please see the history of present illness.   Otherwise, review of systems are positive for none.   All other systems are reviewed and negative.    PHYSICAL EXAM: VS:  BP 140/60 (BP Location: Left Arm, Patient Position: Sitting, Cuff Size: Normal)   Pulse 73   Ht 5\' 8"  (1.727 m)   Wt 191 lb 4 oz (86.8 kg)   BMI 29.08 kg/m  , BMI Body mass index is 29.08 kg/m. GEN: Well nourished, well developed, in no acute distress  HEENT: normal  Neck: no JVD, carotid bruits, or masses Cardiac: Regular rate and rhythm; no rubs, or gallops,no edema .  Normal mechanical heart sound. Respiratory:  clear to auscultation bilaterally, normal work of breathing GI: soft, nontender, nondistended, + BS MS: no deformity or atrophy  Skin: warm and dry, no rash Neuro:  Strength and sensation are intact Psych: euthymic mood, full affect   EKG:  EKG is ordered today. The ekg ordered today demonstrates ventricular paced rhythm with PVCs and underlying atrial fibrillation.   Recent Labs: 07/10/2018: ALT 9; TSH 2.960 07/29/2018: BUN  13; Creatinine, Ser 1.01; Magnesium 2.0; Potassium 4.7; Sodium 137 09/15/2018: Hemoglobin 12.8; Platelets 249    Lipid Panel    Component Value Date/Time   CHOL 147 11/12/2017 0832   TRIG 120 11/12/2017 0832   HDL 41 11/12/2017 0832   CHOLHDL 4.1 12/10/2016 0953   LDLCALC 82 11/12/2017 0832      Wt Readings from Last 3 Encounters:  10/16/18 191 lb 4 oz (86.8 kg)  10/06/18 191 lb (86.6 kg)  09/22/18 186 lb 14.4 oz (84.8 kg)        ASSESSMENT AND PLAN:  1.  Chronic atrial fibrillation: Ventricular rate is controlled on small dose metoprolol.  Continue long-term anticoagulation with a target INR between 2.5 and 3.5 given mechanical aortic and mitral valves.    2.  Status post aortic and mitral valve replacement with mechanical valve: These were functioning normally on recent echocardiogram.  I referred the patient to cardiac rehab.  3. Hypertrophic obstructive cardiomyopathy: Status post successful myomectomy.  4. Hyperlipidemia : Currently on simvastatin 40 mg daily.  5.  PVCs: Overall asymptomatic.  Continue small dose metoprolol.  6.  Status post permanent pacemaker placement post surgery due to complete heart block: The patient would like to continue follow-up at [facility]  Disposition:   FU with me in 4 months  Signed,  Kathlyn Sacramento, MD  10/16/2018 9:54 AM    [place]

## 2018-10-20 ENCOUNTER — Telehealth: Payer: Self-pay | Admitting: Cardiovascular Disease

## 2018-10-20 DIAGNOSIS — Z952 Presence of prosthetic heart valve: Secondary | ICD-10-CM | POA: Diagnosis not present

## 2018-10-20 MED ORDER — AMOXICILLIN 500 MG PO CAPS: ORAL_CAPSULE | ORAL | 1 refills | Status: DC

## 2018-10-20 NOTE — Telephone Encounter (Signed)
Ryan,   Can you just review really quick for me. Patient has had mechanical aortic & mitral valve replacement done. I think he getting a dental cleaning tomorrow.  Amoxicillin 2 g 1 hour prior??

## 2018-10-20 NOTE — Telephone Encounter (Signed)
I spoke with the patient's wife. She is aware the patient will need amoxicillin 2 grams 30-60 minutes prior to his dental cleaning tomorrow. She thinks he has this at home but is not sure as they are not currently there. I have advised I will send in an updated RX for him, but if he has the same thing at home already he can use what he has and save our RX for later.   The patient's wife is aware and agreeable.

## 2018-10-20 NOTE — Telephone Encounter (Signed)
Patient will need amoxicillin 2 g 30-60 minutes prior to his dental procedure.  Review of chart indicates multiple allergies however none to penicillin class medications.  Please call in if needed.

## 2018-10-20 NOTE — Progress Notes (Signed)
Daily Session Note  Patient Details  Name: [name] [name] MRN: [id number] Date of Birth: 28-Feb-1934 Referring Provider:     Cardiac Rehab from 09/22/2018 in [facility] Cardiac and Pulmonary Rehab  Referring Provider  [name] Sacramento MD      Encounter Date: 10/20/2018  Check In: Session Check In - 10/20/18 [name]      Check-In   Supervising physician immediately available to respond to emergencies  See telemetry face sheet for immediately available ER MD    Location  [facility]-Cardiac & Pulmonary Rehab    Staff Present  [name] Burdock, RN, [name] [name], BS, ACSM CEP, Exercise Physiologist;[name] [name], BA, ACSM CEP, Exercise Physiologist    Medication changes reported      No    Fall or balance concerns reported     No    Warm-up and Cool-down  Performed as group-led instruction    Resistance Training Performed  Yes    VAD Patient?  No    PAD/SET Patient?  No      Pain Assessment   Currently in Pain?  No/denies    Multiple Pain Sites  No          Social History   Tobacco Use  Smoking Status Former Smoker  . Types: Cigars, Cigarettes  . Last attempt to quit: 10/02/1974  . Years since quitting: 44.0  Smokeless Tobacco Never Used  Tobacco Comment   still smokes cigars once a day    Goals Met:  Independence with exercise equipment Exercise tolerated well No report of cardiac concerns or symptoms Strength training completed today  Goals Unmet:  Not Applicable  Comments: Pt able to follow exercise prescription today without complaint.  Will continue to monitor for progression.    Dr. Emily [name] is Medical Director for [facility] and [facility].

## 2018-10-20 NOTE — Telephone Encounter (Signed)
Pt is asking if he is ok to get his cleaned tomorrow.

## 2018-10-22 ENCOUNTER — Encounter: Payer: PPO | Admitting: *Deleted

## 2018-10-22 DIAGNOSIS — Z952 Presence of prosthetic heart valve: Secondary | ICD-10-CM | POA: Diagnosis not present

## 2018-10-22 NOTE — Progress Notes (Signed)
Daily Session Note  Patient Details  Name: [name] [name] MRN: [id number] Date of Birth: 1934-03-14 Referring Provider:     Cardiac Rehab from 09/22/2018 in [facility] Cardiac and Pulmonary Rehab  Referring Provider  Kathlyn Sacramento MD      Encounter Date: 10/22/2018  Check In: Session Check In - 10/22/18 1617      Check-In   Supervising physician immediately available to respond to emergencies  See telemetry face sheet for immediately available ER MD    Location  [facility]-Cardiac & Pulmonary Rehab    Staff Present  Renita Papa, RN BSN;Carroll Enterkin, RN, Vickki Hearing, BA, ACSM CEP, Exercise Physiologist    Medication changes reported      No    Fall or balance concerns reported     No    Tobacco Cessation  No Change    Warm-up and Cool-down  Performed as group-led instruction    Resistance Training Performed  Yes    VAD Patient?  No    PAD/SET Patient?  No      Pain Assessment   Currently in Pain?  No/denies          Social History   Tobacco Use  Smoking Status Former Smoker  . Types: Cigars, Cigarettes  . Last attempt to quit: 10/02/1974  . Years since quitting: 44.0  Smokeless Tobacco Never Used  Tobacco Comment   still smokes cigars once a day    Goals Met:  Independence with exercise equipment Exercise tolerated well No report of cardiac concerns or symptoms Strength training completed today  Goals Unmet:  Not Applicable  Comments: Pt able to follow exercise prescription today without complaint.  Will continue to monitor for progression.    Dr. Emily Filbert is Medical Director for [place] and [facility].

## 2018-10-23 DIAGNOSIS — Z952 Presence of prosthetic heart valve: Secondary | ICD-10-CM | POA: Diagnosis not present

## 2018-10-23 NOTE — Progress Notes (Signed)
Daily Session Note  Patient Details  Name: NEWELL WAFER MRN: [id number] Date of Birth: [birth year] Referring Provider:     Cardiac Rehab from 09/22/2018 in [facility] Cardiac and Pulmonary Rehab  Referring Provider  Kathlyn Sacramento MD      Encounter Date: 10/23/2018  Check In: Session Check In - 10/23/18 1614      Check-In   Supervising physician immediately available to respond to emergencies  See telemetry face sheet for immediately available ER MD    Location  [facility]-Cardiac & Pulmonary Rehab    Staff Present  Renita Papa, RN Moises Blood, BS, ACSM CEP, Exercise Physiologist;Finnick Orosz Tessie Fass RCP,RRT,BSRT    Medication changes reported      No    Fall or balance concerns reported     No    Warm-up and Cool-down  Performed as group-led instruction    Resistance Training Performed  Yes    VAD Patient?  No    PAD/SET Patient?  No      Pain Assessment   Currently in Pain?  No/denies          Social History   Tobacco Use  Smoking Status Former Smoker  . Types: Cigars, Cigarettes  . Last attempt to quit: 10/02/1974  . Years since quitting: 44.0  Smokeless Tobacco Never Used  Tobacco Comment   still smokes cigars once a day    Goals Met:  Independence with exercise equipment Exercise tolerated well No report of cardiac concerns or symptoms Strength training completed today  Goals Unmet:  Not Applicable  Comments: Reviewed home exercise with pt today.  Pt plans to walk for exercise.  Reviewed THR, pulse, RPE, sign and symptoms, NTG use, and when to call 911 or MD.  Also discussed weather considerations and indoor options.  Pt voiced understanding.    Dr. Emily Filbert is Medical Director for [place] and [facility].

## 2018-10-24 ENCOUNTER — Ambulatory Visit: Payer: PPO | Admitting: Cardiovascular Disease

## 2018-10-27 ENCOUNTER — Ambulatory Visit: Payer: PPO

## 2018-10-27 ENCOUNTER — Encounter: Payer: PPO | Admitting: *Deleted

## 2018-10-27 DIAGNOSIS — I4891 Unspecified atrial fibrillation: Secondary | ICD-10-CM | POA: Diagnosis not present

## 2018-10-27 DIAGNOSIS — Z952 Presence of prosthetic heart valve: Secondary | ICD-10-CM

## 2018-10-27 DIAGNOSIS — Z950 Presence of cardiac pacemaker: Secondary | ICD-10-CM

## 2018-10-27 DIAGNOSIS — I482 Chronic atrial fibrillation, unspecified: Secondary | ICD-10-CM | POA: Diagnosis not present

## 2018-10-27 DIAGNOSIS — I4892 Unspecified atrial flutter: Secondary | ICD-10-CM

## 2018-10-27 DIAGNOSIS — Z5181 Encounter for therapeutic drug level monitoring: Secondary | ICD-10-CM | POA: Diagnosis not present

## 2018-10-27 LAB — POCT INR: INR: 4.1 — AB (ref 2.0–3.0)

## 2018-10-27 NOTE — Progress Notes (Signed)
Daily Session Note  Patient Details  Name: [name] [name] MRN: [id number] Date of Birth: [birth year] Referring Provider:     Cardiac Rehab from 09/22/2018 in [facility] Cardiac and Pulmonary Rehab  Referring Provider  Kathlyn Sacramento MD      Encounter Date: 10/27/2018  Check In: Session Check In - 10/27/18 1740      Check-In   Supervising physician immediately available to respond to emergencies  See telemetry face sheet for immediately available ER MD    Location  [facility]-Cardiac & Pulmonary Rehab    Staff Present  Earlean Shawl, BS, ACSM CEP, Exercise Physiologist;Amanda Oletta Darter, BA, ACSM CEP, Exercise Physiologist;Carroll Enterkin, RN, BSN    Medication changes reported      No    Fall or balance concerns reported     No    Tobacco Cessation  No Change    Warm-up and Cool-down  Performed as group-led instruction    Resistance Training Performed  Yes    VAD Patient?  No    PAD/SET Patient?  No      Pain Assessment   Currently in Pain?  Other (Comment)    Multiple Pain Sites  No          Social History   Tobacco Use  Smoking Status Former Smoker  . Types: Cigars, Cigarettes  . Last attempt to quit: 10/02/1974  . Years since quitting: 44.0  Smokeless Tobacco Never Used  Tobacco Comment   still smokes cigars once a day    Goals Met:  Independence with exercise equipment Exercise tolerated well No report of cardiac concerns or symptoms Strength training completed today  Goals Unmet:  Not Applicable  Comments: Pt able to follow exercise prescription today without complaint.  Will continue to monitor for progression.    Dr. Emily Filbert is Medical Director for [place] and [facility].

## 2018-10-27 NOTE — Patient Instructions (Signed)
Please skip coumadin tonight, then resume dosage of 1.5 tablets every day EXCEPT 2 [place]. Recheck in 3 weeks. Please call [phone] if you have a GI procedure and need to hold coumadin.

## 2018-10-28 ENCOUNTER — Ambulatory Visit: Payer: PPO | Admitting: Cardiovascular Disease

## 2018-10-29 ENCOUNTER — Encounter: Payer: PPO | Admitting: *Deleted

## 2018-10-29 DIAGNOSIS — Z952 Presence of prosthetic heart valve: Secondary | ICD-10-CM

## 2018-10-29 NOTE — Progress Notes (Signed)
Daily Session Note  Patient Details  Name: GIAN YBARRA MRN: [id number] Date of Birth: [birth year] Referring Provider:     Cardiac Rehab from 09/22/2018 in [facility] Cardiac and Pulmonary Rehab  Referring Provider  Kathlyn Sacramento MD      Encounter Date: 10/29/2018  Check In: Session Check In - 10/29/18 1615      Check-In   Supervising physician immediately available to respond to emergencies  See telemetry face sheet for immediately available ER MD    Location  [facility]-Cardiac & Pulmonary Rehab    Staff Present  Nada Maclachlan, BA, ACSM CEP, Exercise Physiologist;Carroll Enterkin, RN, BSN;Meredith Sherryll Burger, RN BSN    Medication changes reported      No    Fall or balance concerns reported     No    Tobacco Cessation  No Change    Warm-up and Cool-down  Performed as group-led instruction    Resistance Training Performed  Yes    VAD Patient?  No    PAD/SET Patient?  No      Pain Assessment   Currently in Pain?  No/denies          Social History   Tobacco Use  Smoking Status Former Smoker  . Types: Cigars, Cigarettes  . Last attempt to quit: 10/02/1974  . Years since quitting: 44.1  Smokeless Tobacco Never Used  Tobacco Comment   still smokes cigars once a day    Goals Met:  Independence with exercise equipment Exercise tolerated well No report of cardiac concerns or symptoms Strength training completed today  Goals Unmet:  Not Applicable  Comments: Pt able to follow exercise prescription today without complaint.  Will continue to monitor for progression.    Dr. Emily Filbert is Medical Director for [place] and [facility].

## 2018-10-30 DIAGNOSIS — Z952 Presence of prosthetic heart valve: Secondary | ICD-10-CM | POA: Diagnosis not present

## 2018-10-30 NOTE — Progress Notes (Signed)
Daily Session Note  Patient Details  Name: KARSTON HYLAND MRN: [id number] Date of Birth: April 15, 1934 Referring Provider:     Cardiac Rehab from 09/22/2018 in [facility] Cardiac and Pulmonary Rehab  Referring Provider  Kathlyn Sacramento MD      Encounter Date: 10/30/2018  Check In: Session Check In - 10/30/18 1617      Check-In   Supervising physician immediately available to respond to emergencies  See telemetry face sheet for immediately available ER MD    Location  [facility]-Cardiac & Pulmonary Rehab    Staff Present  Renita Papa, RN BSN;Joseph [address] [place], [place], ACSM CEP, Exercise Physiologist    Medication changes reported      No    Fall or balance concerns reported     No    Warm-up and Cool-down  Performed as group-led instruction    Resistance Training Performed  Yes    VAD Patient?  No    PAD/SET Patient?  No      Pain Assessment   Currently in Pain?  No/denies          Social History   Tobacco Use  Smoking Status Former Smoker  . Types: Cigars, Cigarettes  . Last attempt to quit: 10/02/1974  . Years since quitting: 44.1  Smokeless Tobacco Never Used  Tobacco Comment   still smokes cigars once a day    Goals Met:  Independence with exercise equipment Exercise tolerated well No report of cardiac concerns or symptoms Strength training completed today  Goals Unmet:  Not Applicable  Comments: Pt able to follow exercise prescription today without complaint.  Will continue to monitor for progression.    Dr. Emily Filbert is Medical Director for [place] and [facility].

## 2018-11-09 DIAGNOSIS — G4733 Obstructive sleep apnea (adult) (pediatric): Secondary | ICD-10-CM | POA: Diagnosis not present

## 2018-11-10 ENCOUNTER — Encounter: Payer: PPO | Admitting: *Deleted

## 2018-11-10 DIAGNOSIS — Z952 Presence of prosthetic heart valve: Secondary | ICD-10-CM

## 2018-11-10 NOTE — Progress Notes (Signed)
Daily Session Note  Patient Details  Name: [name] [name] MRN: [id number] Date of Birth: Aug 31, 1934 Referring Provider:     Cardiac Rehab from 09/22/2018 in [facility] Cardiac and Pulmonary Rehab  Referring Provider  Kathlyn Sacramento MD      Encounter Date: 11/10/2018  Check In: Session Check In - 11/10/18 1656      Check-In   Supervising physician immediately available to respond to emergencies  See telemetry face sheet for immediately available ER MD    Location  [facility]-Cardiac & Pulmonary Rehab    Staff Present  Earlean Shawl, BS, ACSM CEP, Exercise Physiologist;Amanda Oletta Darter, BA, ACSM CEP, Exercise Physiologist;Carroll Enterkin, RN, BSN    Medication changes reported      No    Fall or balance concerns reported     No    Tobacco Cessation  No Change    Warm-up and Cool-down  Performed as group-led instruction    Resistance Training Performed  Yes    VAD Patient?  No    PAD/SET Patient?  No      Pain Assessment   Currently in Pain?  No/denies    Multiple Pain Sites  No          Social History   Tobacco Use  Smoking Status Former Smoker  . Types: Cigars, Cigarettes  . Last attempt to quit: 10/02/1974  . Years since quitting: 44.1  Smokeless Tobacco Never Used  Tobacco Comment   still smokes cigars once a day    Goals Met:  Independence with exercise equipment Exercise tolerated well No report of cardiac concerns or symptoms Strength training completed today  Goals Unmet:  Not Applicable  Comments: Pt able to follow exercise prescription today without complaint.  Will continue to monitor for progression.    Dr. Emily Filbert is Medical Director for [place] and [facility].

## 2018-11-12 ENCOUNTER — Encounter: Payer: PPO | Admitting: *Deleted

## 2018-11-12 ENCOUNTER — Encounter: Payer: Self-pay | Admitting: *Deleted

## 2018-11-12 DIAGNOSIS — Z952 Presence of prosthetic heart valve: Secondary | ICD-10-CM | POA: Diagnosis not present

## 2018-11-12 DIAGNOSIS — B351 Tinea unguium: Secondary | ICD-10-CM | POA: Diagnosis not present

## 2018-11-12 DIAGNOSIS — E119 Type 2 diabetes mellitus without complications: Secondary | ICD-10-CM | POA: Diagnosis not present

## 2018-11-12 NOTE — Progress Notes (Signed)
Daily Session Note  Patient Details  Name: HOOPER PETTEWAY MRN: [id number] Date of Birth: [birth year] Referring Provider:     Cardiac Rehab from 09/22/2018 in [facility] Cardiac and Pulmonary Rehab  Referring Provider  Kathlyn Sacramento MD      Encounter Date: 11/12/2018  Check In: Session Check In - 11/12/18 1622      Check-In   Supervising physician immediately available to respond to emergencies  See telemetry face sheet for immediately available ER MD    Location  [facility]-Cardiac & Pulmonary Rehab    Staff Present  Renita Papa, RN BSN;Carroll Enterkin, RN, Vickki Hearing, BA, ACSM CEP, Exercise Physiologist    Medication changes reported      No    Fall or balance concerns reported     No    Tobacco Cessation  No Change    Warm-up and Cool-down  Performed as group-led instruction    Resistance Training Performed  Yes    VAD Patient?  No    PAD/SET Patient?  No      Pain Assessment   Currently in Pain?  No/denies          Social History   Tobacco Use  Smoking Status Former Smoker  . Types: Cigars, Cigarettes  . Last attempt to quit: 10/02/1974  . Years since quitting: 44.1  Smokeless Tobacco Never Used  Tobacco Comment   still smokes cigars once a day    Goals Met:  Independence with exercise equipment Exercise tolerated well No report of cardiac concerns or symptoms Strength training completed today  Goals Unmet:  Not Applicable  Comments: Pt able to follow exercise prescription today without complaint.  Will continue to monitor for progression.    Dr. Emily Filbert is Medical Director for [place] and [facility].

## 2018-11-12 NOTE — Progress Notes (Signed)
Cardiac Individual Treatment Plan  Patient Details  Name: [name] [name] MRN: [id number] Date of Birth: 06-22-1934 Referring Provider:     Cardiac Rehab from 09/22/2018 in [facility] Cardiac and Pulmonary Rehab  Referring Provider  Kathlyn Sacramento MD      Initial Encounter Date:    Cardiac Rehab from 09/22/2018 in [facility] Cardiac and Pulmonary Rehab  Date  09/22/18      Visit Diagnosis: S/P aortic valve replacement  S/P mitral valve replacement  Patient's Home Medications on Admission:  Current Outpatient Medications:  .  acetaminophen (TYLENOL) 325 MG tablet, Take 650 mg by mouth every 6 (six) hours as needed., Disp: , Rfl:  .  amoxicillin (AMOXIL) 500 MG capsule, Take 4 capsules (2000 mg) 30-60 minutes prior to dental cleaning, Disp: 4 capsule, Rfl: 1 .  aspirin EC 81 MG tablet, Take 1 tablet (81 mg total) by mouth daily., Disp: , Rfl:  .  glucose blood (ONE TOUCH ULTRA TEST) test strip, USE ONE STRIP TO CHECK GLUCOSE ONCE DAILY, Disp: 100 each, Rfl: 12 .  latanoprost (XALATAN) 0.005 % ophthalmic solution, Place 1 drop into both eyes at bedtime. , Disp: , Rfl:  .  levothyroxine (SYNTHROID, LEVOTHROID) 50 MCG tablet, TAKE 1 TABLET BY MOUTH ONCE DAILY, Disp: 90 tablet, Rfl: 3 .  metFORMIN (GLUCOPHAGE) 1000 MG tablet, Take 1 tablet (1,000 mg total) by mouth daily with breakfast., Disp: 90 tablet, Rfl: 3 .  metoprolol tartrate (LOPRESSOR) 25 MG tablet, Take 12.5 mg by mouth 2 (two) times daily., Disp: , Rfl:  .  NON FORMULARY, Metoprolol 50 mg qd., Disp: , Rfl:  .  ONE TOUCH ULTRA TEST test strip, USE TO TEST BLOOD SUGAR ONCE DAILY, Disp: 50 each, Rfl: 25 .  ONETOUCH DELICA LANCETS 62M MISC, USE ONE LANCET TO CHECK BLOOD GLUCOSE ONCE DAILY, Disp: 100 each, Rfl: 12 .  ONETOUCH DELICA LANCETS 35D MISC, USE TO TEST BLOOD SUGAR ONCE DAILY, Disp: 100 each, Rfl: 12 .  pantoprazole (PROTONIX) 40 MG tablet, Take 1 tablet (40 mg total) by mouth 2 (two) times daily., Disp: 60 tablet, Rfl: 3 .   simvastatin (ZOCOR) 40 MG tablet, TAKE 1/2 (ONE-HALF) TABLET BY MOUTH AT BEDTIME, Disp: 45 tablet, Rfl: 3 .  warfarin (COUMADIN) 3 MG tablet, TAKE 1 TABLET BY MOUTH ONCE DAILY AT  6PM, Disp: 90 tablet, Rfl: 0  Past Medical History: Past Medical History:  Diagnosis Date  . Atherosclerosis of aorta ([place])    by CT scan in past  . Atrial fibrillation ([place])    and aflutter. pt has a left atrial circuit that is not ablated. was on amiodarone-stopped, now use rate control.   . Bladder cancer ([place])    hx; treated with BCG in past   . Carotid artery disease ([place])    There was calcified plaque but no stenosis by carotid artery screening done at [facility] October, 2013  . CHB (complete heart block) ([place])    a. 05/2018 s/p BSX VVIR PPM ([facility]) in setting of bradycardia following myomectomy and AVR/MVR.  . Diabetes mellitus    type not specified  . Diabetic neuropathy (HCC)    feet  . Elevated liver enzymes    over time; hx  . Excessive sweating   . Glaucoma   . Gout   . Head injury    when slipped on ice 2004-2005. stabilized and back on Coumadin   . Headache    migraines - distant past  . History of cardiac  cath    a. 05/2018 Cath ([facility]): Non-obstructive CAD. Mildly elevated filling pressures w/ nl CI.  [name] HOCM (hypertrophic obstructive cardiomyopathy) ([place])    a. 01/2016 Echo: EF 65-70%, no rwma, LVOT gradient of 80-30mHg, mod AS, SAM; b. 11/2017 Echo: EF 55-60%, near cavity obliteration in systole, mod AS, mild MS; c. 04/2018 Card MRI ([facility]): Sev LVH, EF >70%, Sev AS, LVOT obs w/ Sev MR, mild to mod TR/PR, mid-myocardial HE in basal-mid anteroseptum and inferoseptum; d. 05/2018 s/p Septal myomectomy; e. 05/2018 Echo: Nl EF w/ sev LVH. Mech AoV and MV.  . Homocystinemia ([place]    elevated, mild   . Hypercholesterolemia    treated.   . Hypertension   . Kidney mass    laproscopic surgery woth cryoablation of [facility]   . Moderate aortic stenosis     a. 01/2016 echo: mod AS.   .Marland KitchenMotion sickness    ocean boats  . Nausea   . Obstructive sleep apnea    CPAP started successfully 2014  . Orthostatic hypotension    a. orthostatic. dehydration. hospitalized 11/11  . Sleep apnea    Significant obstructive sleep apnea diagnosed in August, 2012, the patient is to receive CPAP   . Stroke ([facility]    "2 old strokes" CT and MRI. [place] [facility] 11/11. diagnosis was dehydration, no acutal reports.   . TSH elevation    on synthroid historically   . Unsteady gait    August, 2012    Tobacco Use: Social History   Tobacco Use  Smoking Status Former Smoker  . Types: Cigars, Cigarettes  . Last attempt to quit: 10/02/1974  . Years since quitting: 44.1  Smokeless Tobacco Never Used  Tobacco Comment   still smokes cigars once a day    Labs: Recent Review Flowsheet Data    Labs for ITP Cardiac and Pulmonary Rehab Latest Ref Rng & Units 12/10/2016 03/30/2017 08/05/2017 11/12/2017 07/10/2018   Cholestrol 100 - 199 mg/dL 168 137 - 147 -   LDLCALC 0 - 99 mg/dL 96 70 - 82 -   HDL >39 mg/dL 41 36 - 41 -   Trlycerides 0 - 149 mg/dL 155(H) 153 - 120 -   Hemoglobin A1c 4.8 - 5.6 % 6.6(H) 7.4 6.5 6.3(H) 5.5       Exercise Target Goals: Exercise Program Goal: Individual exercise prescription set using results from initial 6 min walk test and THRR while considering  patient's activity barriers and safety.   Exercise Prescription Goal: Initial exercise prescription builds to 30-45 minutes a day of aerobic activity, 2-3 days per week.  Home exercise guidelines will be given to patient during program as part of exercise prescription that the participant will acknowledge.  Activity Barriers & Risk Stratification: Activity Barriers & Cardiac Risk Stratification - 09/22/18 1341      Activity Barriers & Cardiac Risk Stratification   Activity Barriers  Decreased Ventricular Function;Arthritis;Deconditioning;Muscular Weakness   left knee pain from dislocation  in college   Cardiac Risk Stratification  High       6 Minute Walk: 6 Minute Walk    Row Name 09/22/18 1352         6 Minute Walk   Phase  Initial     Distance  1015 feet     Walk Time  6 minutes     MPH  1.92     METS  2     RPE  13  Perceived Dyspnea   1     VO2 Peak  7.01     Symptoms  Yes (comment)     Comments  SOB, unsteady gait in shoes     Resting HR  80 bpm     Resting BP  130/70     Resting Oxygen Saturation   98 %     Exercise Oxygen Saturation  during 6 min walk  94 %     Max Ex. HR  107 bpm     Max Ex. BP  156/76     2 Minute Post BP  134/64        Oxygen Initial Assessment:   Oxygen Re-Evaluation:   Oxygen Discharge (Final Oxygen Re-Evaluation):   Initial Exercise Prescription: Initial Exercise Prescription - 09/22/18 1400      Date of Initial Exercise RX and Referring Provider   Date  09/22/18    Referring Provider  Kathlyn Sacramento MD      Treadmill   MPH  1.3    Grade  0.5    Minutes  15    METs  2.09      NuStep   Level  1    SPM  80    Minutes  15    METs  2      Recumbant Elliptical   Level  1    RPM  50    Minutes  15    METs  2      Prescription Details   Frequency (times per week)  3    Duration  Progress to 45 minutes of aerobic exercise without signs/symptoms of physical distress      Intensity   THRR 40-80% of Max Heartrate  103-126    Ratings of Perceived Exertion  11-13    Perceived Dyspnea  0-4      Progression   Progression  Continue to progress workloads to maintain intensity without signs/symptoms of physical distress.      Resistance Training   Training Prescription  Yes    Weight  3 lbs    Reps  10-15       Perform Capillary Blood Glucose checks as needed.  Exercise Prescription Changes: Exercise Prescription Changes    Row Name 09/22/18 1400 10/07/18 0900 10/22/18 1200 11/05/18 1500       Response to Exercise   Blood Pressure (Admit)  130/70  130/80  124/70  138/64    Blood Pressure  (Exercise)  156/76  148/64  138/82  158/73    Blood Pressure (Exit)  134/64  150/80  122/82  128/60    Heart Rate (Admit)  80 bpm  75 bpm  80 bpm  70 bpm    Heart Rate (Exercise)  100 bpm  130 bpm  96 bpm  131 bpm    Heart Rate (Exit)  76 bpm  73 bpm  58 bpm  70 bpm    Oxygen Saturation (Admit)  98 %  -  -  -    Oxygen Saturation (Exercise)  94 %  -  -  -    Rating of Perceived Exertion (Exercise)  _0 Perceived Dyspnea (Exercise)  1  -  -  -    Symptoms  SOB, usteady shoes, wobbly gait  none  none  none    Comments  walk test results  PM changed to 70  -  -    Duration  -  Progress to 45 minutes of aerobic exercise without signs/symptoms of physical distress  Continue with 45 min of aerobic exercise without signs/symptoms of physical distress.  Continue with 45 min of aerobic exercise without signs/symptoms of physical distress.    Intensity  -  THRR unchanged  Other (comment) use  rpe  Other (comment)      Progression   Progression  -  Continue to progress workloads to maintain intensity without signs/symptoms of physical distress.  Continue to progress workloads to maintain intensity without signs/symptoms of physical distress.  Continue to progress workloads to maintain intensity without signs/symptoms of physical distress.    Average METs  -  1.76  2  2      Resistance Training   Training Prescription  -  Yes  Yes  Yes    Weight  -  3 lb  3 lb  3 lb    Reps  -  10-15  10-15  10-15      Treadmill   MPH  -  1.3  -  1.3    Grade  -  0.5  -  0.5    Minutes  -  15  -  15    METs  -  2.09  -  2.08      NuStep   Level  -  1  -  3    SPM  -  80  -  80    Minutes  -  15  -  15    METs  -  1.7  -  2.2      Recumbant Elliptical   Level  -  1  -  1    RPM  -  50  -  50    Minutes  -  15  -  15    METs  -  1.5  -  1.7       Exercise Comments: Exercise Comments    Row Name 09/24/18 1633 10/23/18 1639         Exercise Comments  irst full day of exercise!  Patient  was oriented to gym and equipment including functions, settings, policies, and procedures.  Patient's individual exercise prescription and treatment plan were reviewed.  All starting workloads were established based on the results of the 6 minute walk test done at initial orientation visit.  The plan for exercise progression was also introduced and progression will be customized based on patient's performance and goals.  Reviewed home exercise with pt today.  Pt plans to walk for exercise.  Reviewed THR, pulse, RPE, sign and symptoms, NTG use, and when to call 911 or MD.  Also discussed weather considerations and indoor options.  Pt voiced understanding.         Exercise Goals and Review: Exercise Goals    Row Name 09/22/18 1452             Exercise Goals   Increase Physical Activity  Yes       Intervention  Provide advice, education, support and counseling about physical activity/exercise needs.;Develop an individualized exercise prescription for aerobic and resistive training based on initial evaluation findings, risk stratification, comorbidities and participant's personal goals.       Expected Outcomes  Short Term: Attend rehab on a regular basis to increase amount of physical activity.;Long Term: Add in home exercise to make exercise part of routine and to increase amount of physical activity.;Long Term: Exercising regularly at least 3-5 days a week.  Increase Strength and Stamina  Yes       Intervention  Provide advice, education, support and counseling about physical activity/exercise needs.;Develop an individualized exercise prescription for aerobic and resistive training based on initial evaluation findings, risk stratification, comorbidities and participant's personal goals.       Expected Outcomes  Short Term: Increase workloads from initial exercise prescription for resistance, speed, and METs.;Short Term: Perform resistance training exercises routinely during rehab and add in  resistance training at home;Long Term: Improve cardiorespiratory fitness, muscular endurance and strength as measured by increased METs and functional capacity (6MWT)       Able to understand and use rate of perceived exertion (RPE) scale  Yes       Intervention  Provide education and explanation on how to use RPE scale       Expected Outcomes  Short Term: Able to use RPE daily in rehab to express subjective intensity level;Long Term:  Able to use RPE to guide intensity level when exercising independently       Able to understand and use Dyspnea scale  Yes       Intervention  Provide education and explanation on how to use Dyspnea scale       Expected Outcomes  Short Term: Able to use Dyspnea scale daily in rehab to express subjective sense of shortness of breath during exertion;Long Term: Able to use Dyspnea scale to guide intensity level when exercising independently       Knowledge and understanding of Target Heart Rate Range (THRR)  Yes       Intervention  Provide education and explanation of THRR including how the numbers were predicted and where they are located for reference       Expected Outcomes  Short Term: Able to state/look up THRR;Short Term: Able to use daily as guideline for intensity in rehab;Long Term: Able to use THRR to govern intensity when exercising independently       Able to check pulse independently  Yes       Intervention  Provide education and demonstration on how to check pulse in carotid and radial arteries.;Review the importance of being able to check your own pulse for safety during independent exercise       Expected Outcomes  Short Term: Able to explain why pulse checking is important during independent exercise;Long Term: Able to check pulse independently and accurately       Understanding of Exercise Prescription  Yes       Intervention  Provide education, explanation, and written materials on patient's individual exercise prescription       Expected Outcomes  Long  Term: Able to explain home exercise prescription to exercise independently;Short Term: Able to explain program exercise prescription          Exercise Goals Re-Evaluation : Exercise Goals Re-Evaluation    Row Name 10/07/18 0950 10/22/18 1213 10/23/18 1639 11/05/18 1510       Exercise Goal Re-Evaluation   Exercise Goals Review  Increase Physical Activity;Increase Strength and Stamina;Able to understand and use rate of perceived exertion (RPE) scale;Knowledge and understanding of Target Heart Rate Range (THRR);Understanding of Exercise Prescription  Increase Physical Activity;Increase Strength and Stamina;Able to understand and use rate of perceived exertion (RPE) scale;Knowledge and understanding of Target Heart Rate Range (THRR);Understanding of Exercise Prescription  Increase Physical Activity;Increase Strength and Stamina;Able to understand and use rate of perceived exertion (RPE) scale;Knowledge and understanding of Target Heart Rate Range (THRR);Able to check pulse independently;Understanding of Exercise Prescription  Increase Physical Activity;Increase Strength and Stamina;Able to understand and use rate of perceived exertion (RPE) scale;Knowledge and understanding of Target Heart Rate Range (THRR)    Comments  Amiere has tolerated exercise well.  Staff will monitor progress.   Darcy is able to do 45 min of cardiovascular exercise.  He has improved MET level slightly.  Staff will monitor progress.  Reviewed home exercise with pt today.  Pt plans to walk for exercise.  Reviewed THR, pulse, RPE, sign and symptoms, NTG use, and when to call 911 or MD.  Also discussed weather considerations and indoor options.  Pt voiced understanding.  Talin attends consistently and is up to level 3 on NS.  he reports RPE of 15-17 on TM so has not increased speed or grade yet.  Staff will monitor progress.    Expected Outcomes  Short - increase levels on NS and XR Long - improve overall MET level  Short - continue to attend  consistently Long - improve MET level  Short - add one day walking when weather permits Long - exercise independently  Short - become more comfortable on TM Long - increase overall MET level       Discharge Exercise Prescription (Final Exercise Prescription Changes): Exercise Prescription Changes - 11/05/18 1500      Response to Exercise   Blood Pressure (Admit)  138/64    Blood Pressure (Exercise)  158/73    Blood Pressure (Exit)  128/60    Heart Rate (Admit)  70 bpm    Heart Rate (Exercise)  131 bpm    Heart Rate (Exit)  70 bpm    Rating of Perceived Exertion (Exercise)  17    Symptoms  none    Duration  Continue with 45 min of aerobic exercise without signs/symptoms of physical distress.    Intensity  Other (comment)      Progression   Progression  Continue to progress workloads to maintain intensity without signs/symptoms of physical distress.    Average METs  2      Resistance Training   Training Prescription  Yes    Weight  3 lb    Reps  10-15      Treadmill   MPH  1.3    Grade  0.5    Minutes  15    METs  2.08      NuStep   Level  3    SPM  80    Minutes  15    METs  2.2      Recumbant Elliptical   Level  1    RPM  50    Minutes  15    METs  1.7       Nutrition:  Target Goals: Understanding of nutrition guidelines, daily intake of sodium <1528m, cholesterol <2043m calories 30% from fat and 7% or less from saturated fats, daily to have 5 or more servings of fruits and vegetables.  Biometrics: Pre Biometrics - 09/22/18 1453      Pre Biometrics   Height  5' 8.4" (1.737 m)    Weight  186 lb 14.4 oz (84.8 kg)    Waist Circumference  40 inches    Hip Circumference  40.5 inches    Waist to Hip Ratio  0.99 %    BMI (Calculated)  28.1    Single Leg Stand  15.44 seconds        Nutrition Therapy Plan and Nutrition Goals: Nutrition Therapy & Goals - 09/25/18 1044  Nutrition Therapy   Diet  DM    Drug/Food Interactions  Coumadin/Vit K    Protein  (specify units)  10oz    Fiber  30 grams    Whole Grain Foods  3 servings   eats white/wheat bread    Saturated Fats  13 max. grams    Fruits and Vegetables  5 servings/day   8 ideal; eats small portions but does try to eat fruits and vegetables daily   Sodium  2000 grams      Personal Nutrition Goals   Nutrition Goal  Drink one additional glass of water per day    Personal Goal #2  Use 1/2 of a cup as as guide when portioning out foods that contain Vitamin K so that you eat the same amounts each time    Personal Goal #3  Start to think about ways to decrease sodium in your diet    Comments  His wife is responsible for cooking and providing most food for pt. His appetite is improving post surgery and wt is returning to UBW range after losing approx 8# per wife's estimation. (06/2018) HgbA1c 5.5. Breakfast: almond honey bunches of oats with 2% milk + dried cherries or other fruit, sometimes a biscuit. Lunch: "main meal" baked potato, beans, chili beans, ham or egg salad sandwich. Dinner: soup, sandwich, nuts, cheese, mini bagel with cream cheese, casserol occasionlly (prepared by daughter). Snacks: sweets, pretzels, chocolate. They do not eat many fried foods and he is trying to cut down on sweets.      Intervention Plan   Intervention  Prescribe, educate and counsel regarding individualized specific dietary modifications aiming towards targeted core components such as weight, hypertension, lipid management, diabetes, heart failure and other comorbidities.    Expected Outcomes  Short Term Goal: Understand basic principles of dietary content, such as calories, fat, sodium, cholesterol and nutrients.;Long Term Goal: Adherence to prescribed nutrition plan.;Short Term Goal: A plan has been developed with personal nutrition goals set during dietitian appointment.       Nutrition Assessments: Nutrition Assessments - 09/22/18 1341      MEDFICTS Scores   Pre Score  47       Nutrition Goals  Re-Evaluation: Nutrition Goals Re-Evaluation    Row Name 09/25/18 1104             Goals   Nutrition Goal  Use 1/2 of a cup as as guide when portioning out foods that contain Vitamin K so that you eat the same amounts each time       Comment  He is on Coumadin and is having trouble regularing his INR       Expected Outcome  He will find an eating schedule for and choose regulated portions of foods that are high in Vitamin K         Personal Goal #2 Re-Evaluation   Personal Goal #2  Drink one additional glass of water per day         Personal Goal #3 Re-Evaluation   Personal Goal #3  Start to think about ways to decrease sodium in your diet          Nutrition Goals Discharge (Final Nutrition Goals Re-Evaluation): Nutrition Goals Re-Evaluation - 09/25/18 1104      Goals   Nutrition Goal  Use 1/2 of a cup as as guide when portioning out foods that contain Vitamin K so that you eat the same amounts each time    Comment  He is  on Coumadin and is having trouble regularing his INR    Expected Outcome  He will find an eating schedule for and choose regulated portions of foods that are high in Vitamin K      Personal Goal #2 Re-Evaluation   Personal Goal #2  Drink one additional glass of water per day      Personal Goal #3 Re-Evaluation   Personal Goal #3  Start to think about ways to decrease sodium in your diet       Psychosocial: Target Goals: Acknowledge presence or absence of significant depression and/or stress, maximize coping skills, provide positive support system. Participant is able to verbalize types and ability to use techniques and skills needed for reducing stress and depression.   Initial Review & Psychosocial Screening: Initial Psych Review & Screening - 09/22/18 1338      Initial Review   Current issues with  Current Anxiety/Panic;Current Stress Concerns   current health issues   Source of Stress Concerns  Financial;Family;Unable to participate in former  interests or hobbies;Unable to perform yard/household activities      [place]?  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      Screening Interventions   Interventions  Encouraged to exercise;Program counselor consult;To provide support and resources with identified psychosocial needs;Provide feedback about the scores to participant    Expected Outcomes  Short Term goal: Utilizing psychosocial counselor, staff and physician to assist with identification of specific Stressors or current issues interfering with healing process. Setting desired goal for each stressor or current issue identified.;Long Term Goal: Stressors or current issues are controlled or eliminated.;Short Term goal: Identification and review with participant of any Quality of Life or Depression concerns found by scoring the questionnaire.;Long Term goal: The participant improves quality of Life and PHQ9 Scores as seen by post scores and/or verbalization of changes       Quality of Life Scores:  Quality of Life - 09/22/18 1340      Quality of Life   Select  Quality of Life      Quality of Life Scores   Health/Function Pre  23.14 %    Socioeconomic Pre  26.36 %    Psych/Spiritual Pre  28.29 %    Family Pre  30 %    GLOBAL Pre  28.95 %      Scores of 19 and below usually indicate a poorer quality of life in these areas.  A difference of  2-3 points is a clinically meaningful difference.  A difference of 2-3 points in the total score of the Quality of Life Index has been associated with significant improvement in overall quality of life, self-image, physical symptoms, and general health in studies assessing change in quality of life.  PHQ-9: Recent Review Flowsheet Data    Depression  screen [facility] 2/9 09/22/2018 07/08/2018 11/11/2017 11/11/2017 10/30/2016   Decreased Interest 0 0 0 0 0   Down, Depressed, Hopeless 0 0 0 0 0   PHQ - 2 Score 0 0 0 0 0   Altered sleeping 0 - 0 - -   Tired, decreased energy 1 - 0 - -   Change in appetite 0 - 0 - -   Feeling bad or failure about yourself  0 - 0 - -  Trouble concentrating 0 - 0 - -   Moving slowly or fidgety/restless 0 - 0 - -   Suicidal thoughts 0 - 0 - -   PHQ-9 Score 1 - 0 - -   Difficult doing work/chores Not difficult at all - Not difficult at all - -     Interpretation of Total Score  Total Score Depression Severity:  1-4 = Minimal depression, 5-9 = Mild depression, 10-14 = Moderate depression, 15-19 = Moderately severe depression, 20-27 = Severe depression   Psychosocial Evaluation and Intervention: Psychosocial Evaluation - [place] 07, 2020 1702      Psychosocial Evaluation & Interventions   Interventions  Stress management education;Relaxation education;Encouraged to exercise with the program and follow exercise prescription    Comments  Counselor met with Mr. Fellers 2022-10-21 - pronounced "Krista Blue") today for initial psychosocial evaluation.  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  He has a strong support system with a spouse of 57 years and (3) adult children between he and his spouse.  He also is actively involved in his local church  10-21-2022 reports being in fairly good health; sleeps well and has a good appetite most of the time.  He denies a history of depression or anxiety other than situationally when he was going into surgery in August.  But reports at this time he is typically positive and rarely anxious.  He does have some stress in his life with his own health and some "problems dealing with the grandkid's problems!"  He has goals to get back into condition so he can more easily "continue walking and shopping at [facility]."  07-Randeep-2024 will be  followed by staff while in this program.      Expected Outcomes  Short:  October 21, 2022 will exercise for his health and for stress management. He will attend the psychoeducational component on stress and anxiety while in this program.    Long:  10/21/22 will continue to make positive self-care choices by exercising and use of stress management strategies.[name]       Psychosocial Re-Evaluation: Psychosocial Re-Evaluation    Row Name 10/29/18 1720             Psychosocial Re-Evaluation   Current issues with  None Identified       Comments  Counselor follow up with 10-21-2022 today reporting he will be out of the office next week on vacation with his spouse at [facility].  He was encouraged to continue exercising consistently while out of the program.  He was struggling to work out today but was committed to be here and has a good positive attitude.  He will be followed upon his return.       Expected Outcomes  Short:  10-21-22 will continue to exercise while on vacation next week.  Long:  Thunder 07, 2024 will focus on his goals upon return and continue to exercise consistently.          Psychosocial Discharge (Final Psychosocial Re-Evaluation): Psychosocial Re-Evaluation - 10/29/18 1720      Psychosocial Re-Evaluation   Current issues with  None Identified    Comments  Counselor follow up with 10-21-2022 today reporting he will be out of the office next week on vacation with his spouse at [facility].  He was encouraged to continue exercising consistently while out of the program.  He was struggling to work out today but was committed to be  here and has a good positive attitude.  He will be followed upon his return.    Expected Outcomes  Short:  Lamario will continue to exercise while on vacation next week.  Long:  Dequincy will focus on his goals upon return and continue to exercise consistently.       Vocational Rehabilitation: Provide vocational rehab assistance to qualifying candidates.   Vocational Rehab Evaluation &  Intervention: Vocational Rehab - 09/22/18 1307      Initial Vocational Rehab Evaluation & Intervention   Assessment shows need for Vocational Rehabilitation  No       Education: Education Goals: Education classes will be provided on a variety of topics geared toward better understanding of heart health and risk factor modification. Participant will state understanding/return demonstration of topics presented as noted by education test scores.  Learning Barriers/Preferences: Learning Barriers/Preferences - 09/22/18 1306      Learning Barriers/Preferences   Learning Barriers  Hearing    Learning Preferences  None       Education Topics:  AED/CPR: - Group verbal and written instruction with the use of models to demonstrate the basic use of the AED with the basic ABC's of resuscitation.   Cardiac Rehab from 11/10/2018 in [facility] Cardiac and Pulmonary Rehab  Date  10/13/18  Educator  CE  Instruction Review Code  1- Verbalizes Understanding      General Nutrition Guidelines/Fats and Fiber: -Group instruction provided by verbal, written material, models and posters to present the general guidelines for heart healthy nutrition. Gives an explanation and review of dietary fats and fiber.   Cardiac Rehab from 11/10/2018 in [facility] Cardiac and Pulmonary Rehab  Date  10/27/18  Educator  LB  Instruction Review Code  1- Verbalizes Understanding      Controlling Sodium/Reading Food Labels: -Group verbal and written material supporting the discussion of sodium use in heart healthy nutrition. Review and explanation with models, verbal and written materials for utilization of the food label.   Cardiac Rehab from 11/10/2018 in [facility] Cardiac and Pulmonary Rehab  Date  10/29/18  Educator  LB  Instruction Review Code  1- Verbalizes Understanding      Exercise Physiology & General Exercise Guidelines: - Group verbal and written instruction with models to review the exercise physiology of the  cardiovascular system and associated critical values. Provides general exercise guidelines with specific guidelines to those with heart or lung disease.    Aerobic Exercise & Resistance Training: - Gives group verbal and written instruction on the various components of exercise. Focuses on aerobic and resistive training programs and the benefits of this training and how to safely progress through these programs..   Flexibility, Balance, Mind/Body Relaxation: Provides group verbal/written instruction on the benefits of flexibility and balance training, including mind/body exercise modes such as yoga, pilates and tai chi.  Demonstration and skill practice provided.   Cardiac Rehab from 11/10/2018 in [facility] Cardiac and Pulmonary Rehab  Date  11/10/18  Educator  AS  Instruction Review Code  1- Verbalizes Understanding      Stress and Anxiety: - Provides group verbal and written instruction about the health risks of elevated stress and causes of high stress.  Discuss the correlation between heart/lung disease and anxiety and treatment options. Review healthy ways to manage with stress and anxiety.   Depression: - Provides group verbal and written instruction on the correlation between heart/lung disease and depressed mood, treatment options, and the stigmas associated with seeking treatment.   Cardiac Rehab from  11/10/2018 in [facility] Cardiac and Pulmonary Rehab  Date  10/22/18  Educator  Lucianne Lei, MSW  Instruction Review Code  2- Demonstrated Understanding      Anatomy & Physiology of the Heart: - Group verbal and written instruction and models provide basic cardiac anatomy and physiology, with the coronary electrical and arterial systems. Review of Valvular disease and Heart Failure   Cardiac Procedures: - Group verbal and written instruction to review commonly prescribed medications for heart disease. Reviews the medication, class of the drug, and side effects. Includes the steps to  properly store meds and maintain the prescription regimen. (beta blockers and nitrates)   Cardiac Medications I: - Group verbal and written instruction to review commonly prescribed medications for heart disease. Reviews the medication, class of the drug, and side effects. Includes the steps to properly store meds and maintain the prescription regimen.   Cardiac Medications II: -Group verbal and written instruction to review commonly prescribed medications for heart disease. Reviews the medication, class of the drug, and side effects. (all other drug classes)    Go Sex-Intimacy & Heart Disease, Get SMART - Goal Setting: - Group verbal and written instruction through game format to discuss heart disease and the return to sexual intimacy. Provides group verbal and written material to discuss and apply goal setting through the application of the S.M.A.R.T. Method.   Other Matters of the Heart: - Provides group verbal, written materials and models to describe Stable Angina and Peripheral Artery. Includes description of the disease process and treatment options available to the cardiac patient.   Exercise & Equipment Safety: - Individual verbal instruction and demonstration of equipment use and safety with use of the equipment.   Cardiac Rehab from 11/10/2018 in [facility] Cardiac and Pulmonary Rehab  Date  09/22/18  Educator  [facility]  Instruction Review Code  1- Verbalizes Understanding      Infection Prevention: - Provides verbal and written material to individual with discussion of infection control including proper hand washing and proper equipment cleaning during exercise session.   Cardiac Rehab from 11/10/2018 in [facility] Cardiac and Pulmonary Rehab  Date  09/22/18  Educator  [facility]  Instruction Review Code  1- Verbalizes Understanding      Falls Prevention: - Provides verbal and written material to individual with discussion of falls prevention and safety.   Cardiac Rehab from 11/10/2018 in [facility]  Cardiac and Pulmonary Rehab  Date  09/22/18  Educator  [facility]  Instruction Review Code  1- Verbalizes Understanding      Diabetes: - Individual verbal and written instruction to review signs/symptoms of diabetes, desired ranges of glucose level fasting, after meals and with exercise. Acknowledge that pre and post exercise glucose checks will be done for 3 sessions at entry of program.   Cardiac Rehab from 11/10/2018 in [facility] Cardiac and Pulmonary Rehab  Date  09/22/18  Educator  [facility]  Instruction Review Code  1- Verbalizes Understanding      Know Your Numbers and Risk Factors: -Group verbal and written instruction about important numbers in your health.  Discussion of what are risk factors and how they play a role in the disease process.  Review of Cholesterol, Blood Pressure, Diabetes, and BMI and the role they play in your overall health.   Sleep Hygiene: -Provides group verbal and written instruction about how sleep can affect your health.  Define sleep hygiene, discuss sleep cycles and impact of sleep habits. Review good sleep hygiene tips.    Other: -Provides group  and verbal instruction on various topics (see comments)   Knowledge Questionnaire Score: Knowledge Questionnaire Score - 09/22/18 1306      Knowledge Questionnaire Score   Pre Score  21/25   correct answers reviewed with Ryleigh. Focus on anatomy, nutrition, exercise      Core Components/Risk Factors/Patient Goals at Admission: Personal Goals and Risk Factors at Admission - 09/22/18 1305      Core Components/Risk Factors/Patient Goals on Admission    Weight Management  Yes;Weight Maintenance    Intervention  Weight Management: Develop a combined nutrition and exercise program designed to reach desired caloric intake, while maintaining appropriate intake of nutrient and fiber, sodium and fats, and appropriate energy expenditure required for the weight goal.;Weight Management: Provide education and appropriate resources to  help participant work on and attain dietary goals.    Admit Weight  185 lb (83.9 kg)    Expected Outcomes  Short Term: Continue to assess and modify interventions until short term weight is achieved;Long Term: Adherence to nutrition and physical activity/exercise program aimed toward attainment of established weight goal;Weight Maintenance: Understanding of the daily nutrition guidelines, which includes 25-35% calories from fat, 7% or less cal from saturated fats, less than '200mg'$  cholesterol, less than 1.5gm of sodium, & 5 or more servings of fruits and vegetables daily;Understanding recommendations for meals to include 15-35% energy as protein, 25-35% energy from fat, 35-60% energy from carbohydrates, less than '200mg'$  of dietary cholesterol, 20-35 gm of total fiber daily;Understanding of distribution of calorie intake throughout the day with the consumption of 4-5 meals/snacks    Diabetes  Yes    Intervention  Provide education about signs/symptoms and action to take for hypo/hyperglycemia.;Provide education about proper nutrition, including hydration, and aerobic/resistive exercise prescription along with prescribed medications to achieve blood glucose in normal ranges: Fasting glucose 65-99 mg/dL    Expected Outcomes  Short Term: Participant verbalizes understanding of the signs/symptoms and immediate care of hyper/hypoglycemia, proper foot care and importance of medication, aerobic/resistive exercise and nutrition plan for blood glucose control.;Long Term: Attainment of HbA1C < 7%.    Lipids  Yes    Intervention  Provide education and support for participant on nutrition & aerobic/resistive exercise along with prescribed medications to achieve LDL '70mg'$ , HDL >'40mg'$ .    Expected Outcomes  Short Term: Participant states understanding of desired cholesterol values and is compliant with medications prescribed. Participant is following exercise prescription and nutrition guidelines.;Long Term: Cholesterol  controlled with medications as prescribed, with individualized exercise RX and with personalized nutrition plan. Value goals: LDL < '70mg'$ , HDL > 40 mg.       Core Components/Risk Factors/Patient Goals Review:  Goals and Risk Factor Review    Row Name 10/23/18 1640             Core Components/Risk Factors/Patient Goals Review   Personal Goals Review  Weight Management/Obesity;Improve shortness of breath with ADL's;Diabetes;Hypertension;Lipids       Review  Aidric is taking meds as directed.  He has a BP cuff at home but doesnt use it.  His BG at home has been 130-140 ( he admits sneaking chocolate to eat)  He has noticed he can carry full load of laundry and groceries easier than before starting HT.       Expected Outcomes  Short - check BP at home once a day Long - monitor BP, BG at home as well as follow Dr advice to achieve optimal level          Core  Components/Risk Factors/Patient Goals at Discharge (Final Review):  Goals and Risk Factor Review - 10/23/18 1640      Core Components/Risk Factors/Patient Goals Review   Personal Goals Review  Weight Management/Obesity;Improve shortness of breath with ADL's;Diabetes;Hypertension;Lipids    Review  Binnie is taking meds as directed.  He has a BP cuff at home but doesnt use it.  His BG at home has been 130-140 ( he admits sneaking chocolate to eat)  He has noticed he can carry full load of laundry and groceries easier than before starting HT.    Expected Outcomes  Short - check BP at home once a day Long - monitor BP, BG at home as well as follow Dr advice to achieve optimal level       ITP Comments: ITP Comments    Row Name 09/22/18 1301 10/06/18 1649 10/14/18 0641 11/12/18 1400     ITP Comments  Med Review completed. Initial ITP created. Diagnosis can be found in [facility] 8/19  Tamim reports his PM has changed to on demand at 70 bpm  30 Day Review. Continue with ITP unless directed changes per Medical Director review  30 day review completed. ITP  sent to Dr. Emily Filbert, Medical Director of Cardiac Rehab. Continue with ITP unless changes are made by physician.       Comments: 30 day review

## 2018-11-13 DIAGNOSIS — Z952 Presence of prosthetic heart valve: Secondary | ICD-10-CM

## 2018-11-13 NOTE — Progress Notes (Signed)
Daily Session Note  Patient Details  Name: [name] [name] MRN: [id number] Date of Birth: 18-Sinai-1935 Referring Provider:     Cardiac Rehab from 09/22/2018 in [facility] Cardiac and Pulmonary Rehab  Referring Provider  Kathlyn Sacramento MD      Encounter Date: 11/13/2018  Check In:      Social History   Tobacco Use  Smoking Status Former Smoker  . Types: Cigars, Cigarettes  . Last attempt to quit: 10/02/1974  . Years since quitting: 44.1  Smokeless Tobacco Never Used  Tobacco Comment   still smokes cigars once a day    Goals Met:  Independence with exercise equipment Exercise tolerated well No report of cardiac concerns or symptoms Strength training completed today  Goals Unmet:  Not Applicable  Comments: Pt able to follow exercise prescription today without complaint.  Will continue to monitor for progression.    Dr. Emily Filbert is Medical Director for [place] and [facility].

## 2018-11-14 ENCOUNTER — Ambulatory Visit: Payer: PPO | Admitting: Family Medicine

## 2018-11-17 ENCOUNTER — Encounter: Payer: PPO | Attending: Cardiovascular Disease

## 2018-11-17 ENCOUNTER — Ambulatory Visit: Payer: PPO

## 2018-11-17 DIAGNOSIS — I482 Chronic atrial fibrillation, unspecified: Secondary | ICD-10-CM

## 2018-11-17 DIAGNOSIS — Z79899 Other long term (current) drug therapy: Secondary | ICD-10-CM | POA: Diagnosis not present

## 2018-11-17 DIAGNOSIS — I4891 Unspecified atrial fibrillation: Secondary | ICD-10-CM

## 2018-11-17 DIAGNOSIS — M109 Gout, unspecified: Secondary | ICD-10-CM | POA: Diagnosis not present

## 2018-11-17 DIAGNOSIS — Z8551 Personal history of malignant neoplasm of bladder: Secondary | ICD-10-CM | POA: Insufficient documentation

## 2018-11-17 DIAGNOSIS — Z952 Presence of prosthetic heart valve: Secondary | ICD-10-CM

## 2018-11-17 DIAGNOSIS — Z7982 Long term (current) use of aspirin: Secondary | ICD-10-CM | POA: Diagnosis not present

## 2018-11-17 DIAGNOSIS — I700 Atherosclerosis of aorta: Secondary | ICD-10-CM | POA: Insufficient documentation

## 2018-11-17 DIAGNOSIS — Z950 Presence of cardiac pacemaker: Secondary | ICD-10-CM

## 2018-11-17 DIAGNOSIS — G4736 Sleep related hypoventilation in conditions classified elsewhere: Secondary | ICD-10-CM | POA: Diagnosis not present

## 2018-11-17 DIAGNOSIS — Z5181 Encounter for therapeutic drug level monitoring: Secondary | ICD-10-CM | POA: Diagnosis not present

## 2018-11-17 DIAGNOSIS — Z87891 Personal history of nicotine dependence: Secondary | ICD-10-CM | POA: Insufficient documentation

## 2018-11-17 DIAGNOSIS — Z7901 Long term (current) use of anticoagulants: Secondary | ICD-10-CM | POA: Diagnosis not present

## 2018-11-17 DIAGNOSIS — I4892 Unspecified atrial flutter: Secondary | ICD-10-CM

## 2018-11-17 DIAGNOSIS — Z79890 Hormone replacement therapy (postmenopausal): Secondary | ICD-10-CM | POA: Diagnosis not present

## 2018-11-17 DIAGNOSIS — E1142 Type 2 diabetes mellitus with diabetic polyneuropathy: Secondary | ICD-10-CM | POA: Diagnosis not present

## 2018-11-17 DIAGNOSIS — I10 Essential (primary) hypertension: Secondary | ICD-10-CM | POA: Diagnosis not present

## 2018-11-17 LAB — POCT INR: INR: 3.7 — AB (ref 2.0–3.0)

## 2018-11-17 NOTE — Progress Notes (Signed)
Daily Session Note  Patient Details  Name: [name] [name] MRN: [id number] Date of Birth: Nov 27, 1933 Referring Provider:     Cardiac Rehab from 09/22/2018 in [facility] Cardiac and Pulmonary Rehab  Referring Provider  Kathlyn Sacramento MD      Encounter Date: 11/17/2018  Check In: Session Check In - 11/17/18 1654      Check-In   Supervising physician immediately available to respond to emergencies  See telemetry face sheet for immediately available ER MD    Location  [facility]-Cardiac & Pulmonary Rehab    Staff Present  Gerlene Burdock, RN, Moises Blood, BS, ACSM CEP, Exercise Physiologist;Amanda Oletta Darter, BA, ACSM CEP, Exercise Physiologist    Medication changes reported      No    Fall or balance concerns reported     No    Tobacco Cessation  No Change    Warm-up and Cool-down  Performed as group-led instruction    Resistance Training Performed  Yes    VAD Patient?  No    PAD/SET Patient?  No      Pain Assessment   Currently in Pain?  No/denies    Multiple Pain Sites  No          Social History   Tobacco Use  Smoking Status Former Smoker  . Types: Cigars, Cigarettes  . Last attempt to quit: 10/02/1974  . Years since quitting: 44.1  Smokeless Tobacco Never Used  Tobacco Comment   still smokes cigars once a day    Goals Met:  Independence with exercise equipment Exercise tolerated well No report of cardiac concerns or symptoms Strength training completed today  Goals Unmet:  Not Applicable  Comments: Pt able to follow exercise prescription today without complaint.  Will continue to monitor for progression.    Dr. Emily Filbert is Medical Director for [place] and [facility].

## 2018-11-17 NOTE — Patient Instructions (Signed)
Please START NEW DOSAGE of 1.5 tablets every day EXCEPT 2 TABLETS ON FRIDAYS. Recheck in 3 weeks. Please call [phone] if you have a GI procedure and need to hold coumadin.

## 2018-11-19 ENCOUNTER — Encounter: Payer: PPO | Admitting: *Deleted

## 2018-11-19 DIAGNOSIS — Z952 Presence of prosthetic heart valve: Secondary | ICD-10-CM

## 2018-11-19 NOTE — Progress Notes (Signed)
Daily Session Note  Patient Details  Name: [name] [name] MRN: [id number] Date of Birth: 06-02-1934 Referring Provider:     Cardiac Rehab from 09/22/2018 in [facility] Cardiac and Pulmonary Rehab  Referring Provider  Kathlyn Sacramento MD      Encounter Date: 11/19/2018  Check In: Session Check In - 11/19/18 1609      Check-In   Supervising physician immediately available to respond to emergencies  See telemetry face sheet for immediately available MD    Location  [facility]-Cardiac & Pulmonary Rehab    Staff Present  Gerlene Burdock, RN, BSN;Meredith Sherryll Burger, RN Vickki Hearing, BA, ACSM CEP, Exercise Physiologist    Medication changes reported      No    Fall or balance concerns reported     No    Tobacco Cessation  No Change    Warm-up and Cool-down  Performed as group-led instruction    Resistance Training Performed  Yes    VAD Patient?  No    PAD/SET Patient?  No      Pain Assessment   Currently in Pain?  No/denies          Social History   Tobacco Use  Smoking Status Former Smoker  . Types: Cigars, Cigarettes  . Last attempt to quit: 10/02/1974  . Years since quitting: 44.1  Smokeless Tobacco Never Used  Tobacco Comment   still smokes cigars once a day    Goals Met:  Proper associated with RPD/PD & O2 Sat Independence with exercise equipment No report of cardiac concerns or symptoms  Goals Unmet:  Not Applicable  Comments:     Dr. Emily Filbert is Medical Director for [place] and [facility].

## 2018-11-20 DIAGNOSIS — Z952 Presence of prosthetic heart valve: Secondary | ICD-10-CM | POA: Diagnosis not present

## 2018-11-20 NOTE — Progress Notes (Signed)
Daily Session Note  Patient Details  Name: [name] [name] MRN: [id number] Date of Birth: 05/31/1934 Referring Provider:     Cardiac Rehab from 09/22/2018 in [facility] Cardiac and Pulmonary Rehab  Referring Provider  Arida, Muhammad MD      Encounter Date: 11/20/2018  Check In: Session Check In - 11/20/18 1609      Check-In   Supervising physician immediately available to respond to emergencies  See telemetry face sheet for immediately available ER MD    Location  [facility]-Cardiac & Pulmonary Rehab    Staff Present    RCP,RRT,BSRT;Jeanna Durrell BS, Exercise Physiologist;Meredith Craven, RN BSN    Medication changes reported      No    Fall or balance concerns reported     No    Warm-up and Cool-down  Performed as group-led instruction    Resistance Training Performed  Yes    VAD Patient?  No    PAD/SET Patient?  No      Pain Assessment   Currently in Pain?  No/denies          Social History   Tobacco Use  Smoking Status Former Smoker  . Types: Cigars, Cigarettes  . Last attempt to quit: 10/02/1974  . Years since quitting: 44.1  Smokeless Tobacco Never Used  Tobacco Comment   still smokes cigars once a day    Goals Met:  Independence with exercise equipment Exercise tolerated well No report of cardiac concerns or symptoms Strength training completed today  Goals Unmet:  Not Applicable  Comments: Pt able to follow exercise prescription today without complaint.  Will continue to monitor for progression.    Dr. Mark Miller is Medical Director for [facility] and [facility]. 

## 2018-11-21 MED ORDER — WARFARIN SODIUM 3 MG PO TABS: ORAL_TABLET | ORAL | 0 refills | Status: DC

## 2018-11-21 NOTE — Telephone Encounter (Signed)
Unable to close encounter order signature pending .

## 2018-11-21 NOTE — Addendum Note (Signed)
Addended by: SUPPLE, MEGAN E on: 11/21/2018 12:14 PM   Modules accepted: Orders

## 2018-11-24 DIAGNOSIS — Z952 Presence of prosthetic heart valve: Secondary | ICD-10-CM

## 2018-11-24 NOTE — Progress Notes (Signed)
Daily Session Note  Patient Details  Name: [name] [name] MRN: [id number] Date of Birth: 1934/01/20 Referring Provider:     Cardiac Rehab from 09/22/2018 in [facility] Cardiac and Pulmonary Rehab  Referring Provider  Kathlyn Sacramento MD      Encounter Date: 11/24/2018  Check In: Session Check In - 11/24/18 1612      Check-In   Supervising physician immediately available to respond to emergencies  See telemetry face sheet for immediately available ER MD    Location  [facility]-Cardiac & Pulmonary Rehab    Staff Present  Earlean Shawl, BS, ACSM CEP, Exercise Physiologist;Carroll Enterkin, RN, Vickki Hearing, BA, ACSM CEP, Exercise Physiologist    Medication changes reported      No    Fall or balance concerns reported     No    Warm-up and Cool-down  Performed as group-led instruction    Resistance Training Performed  Yes    VAD Patient?  No    PAD/SET Patient?  No      Pain Assessment   Currently in Pain?  No/denies          Social History   Tobacco Use  Smoking Status Former Smoker  . Types: Cigars, Cigarettes  . Last attempt to quit: 10/02/1974  . Years since quitting: 44.1  Smokeless Tobacco Never Used  Tobacco Comment   still smokes cigars once a day    Goals Met:  Independence with exercise equipment Exercise tolerated well No report of cardiac concerns or symptoms Strength training completed today  Goals Unmet:  Not Applicable  Comments: Pt able to follow exercise prescription today without complaint.  Will continue to monitor for progression.    Dr. Emily Filbert is Medical Director for [place] and [facility].

## 2018-11-26 ENCOUNTER — Encounter: Payer: PPO | Admitting: *Deleted

## 2018-11-26 VITALS — Ht 68.4 in | Wt 194.5 lb

## 2018-11-26 DIAGNOSIS — Z952 Presence of prosthetic heart valve: Secondary | ICD-10-CM

## 2018-11-26 NOTE — Progress Notes (Signed)
Daily Session Note  Patient Details  Name: MYRICK MCNAIRY MRN: [id number] Date of Birth: 08-10-1934 Referring Provider:     Cardiac Rehab from 09/22/2018 in [facility] Cardiac and Pulmonary Rehab  Referring Provider  Kathlyn Sacramento MD      Encounter Date: 11/26/2018  Check In: Session Check In - 11/26/18 1616      Check-In   Supervising physician immediately available to respond to emergencies  See telemetry face sheet for immediately available ER MD    Location  [facility]-Cardiac & Pulmonary Rehab    Staff Present  Jasper Loser BS, Exercise Physiologist;Carroll Enterkin, RN, BSN; Sherryll Burger, RN BSN    Medication changes reported      No    Fall or balance concerns reported     No    Tobacco Cessation  No Change    Warm-up and Cool-down  Performed as group-led Higher education careers adviser Performed  Yes    VAD Patient?  No    PAD/SET Patient?  No      Pain Assessment   Currently in Pain?  No/denies          Social History   Tobacco Use  Smoking Status Former Smoker  . Types: Cigars, Cigarettes  . Last attempt to quit: 10/02/1974  . Years since quitting: 44.1  Smokeless Tobacco Never Used  Tobacco Comment   still smokes cigars once a day    Goals Met:  Independence with exercise equipment Exercise tolerated well Personal goals reviewed No report of cardiac concerns or symptoms Strength training completed today  Goals Unmet:  Not Applicable  Comments: Pt able to follow exercise prescription today without complaint.  Will continue to monitor for progression.  [place] Name 09/22/18 1352 11/26/18 1727       6 Minute Walk   Phase  Initial  Discharge    Distance  1015 feet  1135 feet    Distance % Change  -  11.82 %    Distance Feet Change  -  120 ft    Walk Time  6 minutes  6 minutes    # of Rest Breaks  -  0    MPH  1.92  2.14    METS  2  1.8    RPE  13  15    Perceived Dyspnea   1  1    VO2 Peak  7.01  6.33    Symptoms  Yes  (comment)  Yes (comment)    Comments  SOB, unsteady gait in shoes  Some shortness of breath    Resting HR  80 bpm  86 bpm    Resting BP  130/70  128/70    Resting Oxygen Saturation   98 %  -    Exercise Oxygen Saturation  during 6 min walk  94 %  -    Max Ex. HR  107 bpm  130 bpm    Max Ex. BP  156/76  142/60    2 Minute Post BP  134/64  -          Dr. Emily Filbert is Medical Director for [facility].

## 2018-11-27 DIAGNOSIS — R0902 Hypoxemia: Secondary | ICD-10-CM | POA: Diagnosis not present

## 2018-11-27 DIAGNOSIS — E039 Hypothyroidism, unspecified: Secondary | ICD-10-CM | POA: Diagnosis not present

## 2018-11-27 DIAGNOSIS — T450X5A Adverse effect of antiallergic and antiemetic drugs, initial encounter: Secondary | ICD-10-CM | POA: Diagnosis not present

## 2018-11-27 DIAGNOSIS — Z952 Presence of prosthetic heart valve: Secondary | ICD-10-CM | POA: Diagnosis not present

## 2018-11-27 DIAGNOSIS — Z8673 Personal history of transient ischemic attack (TIA), and cerebral infarction without residual deficits: Secondary | ICD-10-CM | POA: Diagnosis not present

## 2018-11-27 DIAGNOSIS — R591 Generalized enlarged lymph nodes: Secondary | ICD-10-CM | POA: Diagnosis not present

## 2018-11-27 DIAGNOSIS — E119 Type 2 diabetes mellitus without complications: Secondary | ICD-10-CM | POA: Diagnosis not present

## 2018-11-27 DIAGNOSIS — Z87891 Personal history of nicotine dependence: Secondary | ICD-10-CM | POA: Diagnosis not present

## 2018-11-27 DIAGNOSIS — Z950 Presence of cardiac pacemaker: Secondary | ICD-10-CM | POA: Diagnosis not present

## 2018-11-27 DIAGNOSIS — I2510 Atherosclerotic heart disease of native coronary artery without angina pectoris: Secondary | ICD-10-CM | POA: Diagnosis not present

## 2018-11-27 DIAGNOSIS — Z85528 Personal history of other malignant neoplasm of kidney: Secondary | ICD-10-CM | POA: Diagnosis not present

## 2018-11-27 DIAGNOSIS — Z8551 Personal history of malignant neoplasm of bladder: Secondary | ICD-10-CM | POA: Diagnosis not present

## 2018-11-27 DIAGNOSIS — Z7901 Long term (current) use of anticoagulants: Secondary | ICD-10-CM | POA: Diagnosis not present

## 2018-11-27 DIAGNOSIS — L270 Generalized skin eruption due to drugs and medicaments taken internally: Secondary | ICD-10-CM | POA: Diagnosis not present

## 2018-11-27 DIAGNOSIS — Z7984 Long term (current) use of oral hypoglycemic drugs: Secondary | ICD-10-CM | POA: Diagnosis not present

## 2018-11-27 DIAGNOSIS — R42 Dizziness and giddiness: Secondary | ICD-10-CM | POA: Diagnosis not present

## 2018-11-27 DIAGNOSIS — Z951 Presence of aortocoronary bypass graft: Secondary | ICD-10-CM | POA: Diagnosis not present

## 2018-11-27 DIAGNOSIS — R531 Weakness: Secondary | ICD-10-CM | POA: Diagnosis not present

## 2018-11-27 DIAGNOSIS — R918 Other nonspecific abnormal finding of lung field: Secondary | ICD-10-CM | POA: Diagnosis not present

## 2018-11-27 DIAGNOSIS — R112 Nausea with vomiting, unspecified: Secondary | ICD-10-CM | POA: Diagnosis not present

## 2018-11-27 DIAGNOSIS — R0602 Shortness of breath: Secondary | ICD-10-CM | POA: Diagnosis not present

## 2018-11-27 DIAGNOSIS — E7800 Pure hypercholesterolemia, unspecified: Secondary | ICD-10-CM | POA: Diagnosis not present

## 2018-11-28 DIAGNOSIS — E039 Hypothyroidism, unspecified: Secondary | ICD-10-CM | POA: Diagnosis not present

## 2018-11-28 DIAGNOSIS — R0902 Hypoxemia: Secondary | ICD-10-CM | POA: Diagnosis not present

## 2018-11-28 DIAGNOSIS — C641 Malignant neoplasm of right kidney, except renal pelvis: Secondary | ICD-10-CM | POA: Diagnosis not present

## 2018-11-28 DIAGNOSIS — R42 Dizziness and giddiness: Secondary | ICD-10-CM | POA: Diagnosis not present

## 2018-11-28 DIAGNOSIS — R591 Generalized enlarged lymph nodes: Secondary | ICD-10-CM | POA: Diagnosis not present

## 2018-11-28 DIAGNOSIS — E7800 Pure hypercholesterolemia, unspecified: Secondary | ICD-10-CM | POA: Diagnosis not present

## 2018-11-28 DIAGNOSIS — R112 Nausea with vomiting, unspecified: Secondary | ICD-10-CM | POA: Diagnosis not present

## 2018-11-28 DIAGNOSIS — T450X5A Adverse effect of antiallergic and antiemetic drugs, initial encounter: Secondary | ICD-10-CM | POA: Diagnosis not present

## 2018-11-28 DIAGNOSIS — I63233 Cerebral infarction due to unspecified occlusion or stenosis of bilateral carotid arteries: Secondary | ICD-10-CM | POA: Diagnosis not present

## 2018-11-28 DIAGNOSIS — I63213 Cerebral infarction due to unspecified occlusion or stenosis of bilateral vertebral arteries: Secondary | ICD-10-CM | POA: Diagnosis not present

## 2018-11-28 DIAGNOSIS — L270 Generalized skin eruption due to drugs and medicaments taken internally: Secondary | ICD-10-CM | POA: Diagnosis not present

## 2018-11-28 DIAGNOSIS — R531 Weakness: Secondary | ICD-10-CM | POA: Diagnosis not present

## 2018-11-28 DIAGNOSIS — Z8551 Personal history of malignant neoplasm of bladder: Secondary | ICD-10-CM | POA: Diagnosis not present

## 2018-11-28 DIAGNOSIS — I890 Lymphedema, not elsewhere classified: Secondary | ICD-10-CM | POA: Diagnosis not present

## 2018-11-28 DIAGNOSIS — H9121 Sudden idiopathic hearing loss, right ear: Secondary | ICD-10-CM | POA: Diagnosis not present

## 2018-11-28 DIAGNOSIS — R918 Other nonspecific abnormal finding of lung field: Secondary | ICD-10-CM | POA: Diagnosis not present

## 2018-11-28 DIAGNOSIS — I2510 Atherosclerotic heart disease of native coronary artery without angina pectoris: Secondary | ICD-10-CM | POA: Diagnosis not present

## 2018-11-28 DIAGNOSIS — E119 Type 2 diabetes mellitus without complications: Secondary | ICD-10-CM | POA: Diagnosis not present

## 2018-11-28 DIAGNOSIS — R69 Illness, unspecified: Secondary | ICD-10-CM | POA: Diagnosis not present

## 2018-11-29 DIAGNOSIS — R531 Weakness: Secondary | ICD-10-CM | POA: Diagnosis not present

## 2018-11-29 DIAGNOSIS — E119 Type 2 diabetes mellitus without complications: Secondary | ICD-10-CM | POA: Diagnosis not present

## 2018-11-29 DIAGNOSIS — R0902 Hypoxemia: Secondary | ICD-10-CM | POA: Diagnosis not present

## 2018-11-29 DIAGNOSIS — R918 Other nonspecific abnormal finding of lung field: Secondary | ICD-10-CM | POA: Diagnosis not present

## 2018-11-29 DIAGNOSIS — E7800 Pure hypercholesterolemia, unspecified: Secondary | ICD-10-CM | POA: Diagnosis not present

## 2018-11-29 DIAGNOSIS — I2510 Atherosclerotic heart disease of native coronary artery without angina pectoris: Secondary | ICD-10-CM | POA: Diagnosis not present

## 2018-11-29 DIAGNOSIS — R591 Generalized enlarged lymph nodes: Secondary | ICD-10-CM | POA: Diagnosis not present

## 2018-11-29 DIAGNOSIS — E039 Hypothyroidism, unspecified: Secondary | ICD-10-CM | POA: Diagnosis not present

## 2018-11-29 DIAGNOSIS — R112 Nausea with vomiting, unspecified: Secondary | ICD-10-CM | POA: Diagnosis not present

## 2018-11-29 DIAGNOSIS — L270 Generalized skin eruption due to drugs and medicaments taken internally: Secondary | ICD-10-CM | POA: Diagnosis not present

## 2018-11-29 DIAGNOSIS — R42 Dizziness and giddiness: Secondary | ICD-10-CM | POA: Diagnosis not present

## 2018-11-29 DIAGNOSIS — T450X5A Adverse effect of antiallergic and antiemetic drugs, initial encounter: Secondary | ICD-10-CM | POA: Diagnosis not present

## 2018-11-30 ENCOUNTER — Inpatient Hospital Stay
Admission: AD | Admit: 2018-11-30 | Discharge: 2018-12-02 | DRG: 204 | Disposition: A | Discharge status: Home Health | Payer: PPO | Source: Other Acute Inpatient Hospital | Attending: Internal Medicine | Admitting: Internal Medicine

## 2018-11-30 DIAGNOSIS — Z950 Presence of cardiac pacemaker: Secondary | ICD-10-CM | POA: Diagnosis not present

## 2018-11-30 DIAGNOSIS — Z8673 Personal history of transient ischemic attack (TIA), and cerebral infarction without residual deficits: Secondary | ICD-10-CM | POA: Diagnosis not present

## 2018-11-30 DIAGNOSIS — F17290 Nicotine dependence, other tobacco product, uncomplicated: Secondary | ICD-10-CM | POA: Diagnosis present

## 2018-11-30 DIAGNOSIS — E7800 Pure hypercholesterolemia, unspecified: Secondary | ICD-10-CM | POA: Diagnosis not present

## 2018-11-30 DIAGNOSIS — N289 Disorder of kidney and ureter, unspecified: Secondary | ICD-10-CM | POA: Diagnosis not present

## 2018-11-30 DIAGNOSIS — Z79890 Hormone replacement therapy (postmenopausal): Secondary | ICD-10-CM

## 2018-11-30 DIAGNOSIS — Z952 Presence of prosthetic heart valve: Secondary | ICD-10-CM

## 2018-11-30 DIAGNOSIS — Z7982 Long term (current) use of aspirin: Secondary | ICD-10-CM

## 2018-11-30 DIAGNOSIS — Z7984 Long term (current) use of oral hypoglycemic drugs: Secondary | ICD-10-CM

## 2018-11-30 DIAGNOSIS — D638 Anemia in other chronic diseases classified elsewhere: Secondary | ICD-10-CM | POA: Diagnosis present

## 2018-11-30 DIAGNOSIS — H9193 Unspecified hearing loss, bilateral: Secondary | ICD-10-CM | POA: Diagnosis present

## 2018-11-30 DIAGNOSIS — N2889 Other specified disorders of kidney and ureter: Secondary | ICD-10-CM | POA: Diagnosis present

## 2018-11-30 DIAGNOSIS — C641 Malignant neoplasm of right kidney, except renal pelvis: Secondary | ICD-10-CM | POA: Diagnosis not present

## 2018-11-30 DIAGNOSIS — K7460 Unspecified cirrhosis of liver: Secondary | ICD-10-CM | POA: Diagnosis present

## 2018-11-30 DIAGNOSIS — I10 Essential (primary) hypertension: Secondary | ICD-10-CM | POA: Diagnosis present

## 2018-11-30 DIAGNOSIS — R0902 Hypoxemia: Secondary | ICD-10-CM | POA: Diagnosis not present

## 2018-11-30 DIAGNOSIS — I4891 Unspecified atrial fibrillation: Secondary | ICD-10-CM | POA: Diagnosis not present

## 2018-11-30 DIAGNOSIS — H6121 Impacted cerumen, right ear: Secondary | ICD-10-CM | POA: Diagnosis not present

## 2018-11-30 DIAGNOSIS — H409 Unspecified glaucoma: Secondary | ICD-10-CM | POA: Diagnosis not present

## 2018-11-30 DIAGNOSIS — C801 Malignant (primary) neoplasm, unspecified: Secondary | ICD-10-CM

## 2018-11-30 DIAGNOSIS — I700 Atherosclerosis of aorta: Secondary | ICD-10-CM | POA: Diagnosis present

## 2018-11-30 DIAGNOSIS — Z823 Family history of stroke: Secondary | ICD-10-CM

## 2018-11-30 DIAGNOSIS — R740 Nonspecific elevation of levels of transaminase and lactic acid dehydrogenase [LDH]: Secondary | ICD-10-CM | POA: Diagnosis not present

## 2018-11-30 DIAGNOSIS — Z8042 Family history of malignant neoplasm of prostate: Secondary | ICD-10-CM

## 2018-11-30 DIAGNOSIS — R591 Generalized enlarged lymph nodes: Secondary | ICD-10-CM | POA: Diagnosis present

## 2018-11-30 DIAGNOSIS — G4733 Obstructive sleep apnea (adult) (pediatric): Secondary | ICD-10-CM | POA: Diagnosis present

## 2018-11-30 DIAGNOSIS — Z8551 Personal history of malignant neoplasm of bladder: Secondary | ICD-10-CM

## 2018-11-30 DIAGNOSIS — R2681 Unsteadiness on feet: Secondary | ICD-10-CM | POA: Diagnosis present

## 2018-11-30 DIAGNOSIS — T450X5A Adverse effect of antiallergic and antiemetic drugs, initial encounter: Secondary | ICD-10-CM | POA: Diagnosis not present

## 2018-11-30 DIAGNOSIS — R079 Chest pain, unspecified: Secondary | ICD-10-CM

## 2018-11-30 DIAGNOSIS — R918 Other nonspecific abnormal finding of lung field: Secondary | ICD-10-CM | POA: Diagnosis not present

## 2018-11-30 DIAGNOSIS — I639 Cerebral infarction, unspecified: Secondary | ICD-10-CM

## 2018-11-30 DIAGNOSIS — E119 Type 2 diabetes mellitus without complications: Secondary | ICD-10-CM | POA: Diagnosis not present

## 2018-11-30 DIAGNOSIS — E1140 Type 2 diabetes mellitus with diabetic neuropathy, unspecified: Secondary | ICD-10-CM | POA: Diagnosis not present

## 2018-11-30 DIAGNOSIS — R0602 Shortness of breath: Secondary | ICD-10-CM | POA: Diagnosis not present

## 2018-11-30 DIAGNOSIS — G4489 Other headache syndrome: Secondary | ICD-10-CM

## 2018-11-30 DIAGNOSIS — Z803 Family history of malignant neoplasm of breast: Secondary | ICD-10-CM

## 2018-11-30 DIAGNOSIS — Z881 Allergy status to other antibiotic agents status: Secondary | ICD-10-CM

## 2018-11-30 DIAGNOSIS — I2510 Atherosclerotic heart disease of native coronary artery without angina pectoris: Secondary | ICD-10-CM | POA: Diagnosis not present

## 2018-11-30 DIAGNOSIS — I421 Obstructive hypertrophic cardiomyopathy: Secondary | ICD-10-CM | POA: Diagnosis not present

## 2018-11-30 DIAGNOSIS — R531 Weakness: Secondary | ICD-10-CM | POA: Diagnosis not present

## 2018-11-30 DIAGNOSIS — R791 Abnormal coagulation profile: Secondary | ICD-10-CM | POA: Diagnosis not present

## 2018-11-30 DIAGNOSIS — R590 Localized enlarged lymph nodes: Secondary | ICD-10-CM

## 2018-11-30 DIAGNOSIS — Z8249 Family history of ischemic heart disease and other diseases of the circulatory system: Secondary | ICD-10-CM

## 2018-11-30 DIAGNOSIS — E785 Hyperlipidemia, unspecified: Secondary | ICD-10-CM | POA: Diagnosis not present

## 2018-11-30 DIAGNOSIS — D649 Anemia, unspecified: Secondary | ICD-10-CM | POA: Diagnosis not present

## 2018-11-30 DIAGNOSIS — R42 Dizziness and giddiness: Secondary | ICD-10-CM | POA: Diagnosis not present

## 2018-11-30 DIAGNOSIS — E1165 Type 2 diabetes mellitus with hyperglycemia: Secondary | ICD-10-CM | POA: Diagnosis present

## 2018-11-30 DIAGNOSIS — L270 Generalized skin eruption due to drugs and medicaments taken internally: Secondary | ICD-10-CM | POA: Diagnosis not present

## 2018-11-30 DIAGNOSIS — R112 Nausea with vomiting, unspecified: Secondary | ICD-10-CM | POA: Diagnosis not present

## 2018-11-30 DIAGNOSIS — Z888 Allergy status to other drugs, medicaments and biological substances status: Secondary | ICD-10-CM

## 2018-11-30 DIAGNOSIS — Z7901 Long term (current) use of anticoagulants: Secondary | ICD-10-CM | POA: Diagnosis not present

## 2018-11-30 DIAGNOSIS — E039 Hypothyroidism, unspecified: Secondary | ICD-10-CM | POA: Diagnosis not present

## 2018-11-30 DIAGNOSIS — Z79899 Other long term (current) drug therapy: Secondary | ICD-10-CM

## 2018-11-30 DIAGNOSIS — Z85528 Personal history of other malignant neoplasm of kidney: Secondary | ICD-10-CM

## 2018-12-01 ENCOUNTER — Inpatient Hospital Stay: Payer: PPO

## 2018-12-01 ENCOUNTER — Encounter: Payer: Self-pay | Admitting: *Deleted

## 2018-12-01 ENCOUNTER — Telehealth: Payer: Self-pay | Admitting: *Deleted

## 2018-12-01 ENCOUNTER — Other Ambulatory Visit: Payer: Self-pay

## 2018-12-01 DIAGNOSIS — R42 Dizziness and giddiness: Secondary | ICD-10-CM

## 2018-12-01 DIAGNOSIS — Z952 Presence of prosthetic heart valve: Secondary | ICD-10-CM

## 2018-12-01 DIAGNOSIS — R590 Localized enlarged lymph nodes: Secondary | ICD-10-CM

## 2018-12-01 DIAGNOSIS — Z7901 Long term (current) use of anticoagulants: Secondary | ICD-10-CM

## 2018-12-01 DIAGNOSIS — R918 Other nonspecific abnormal finding of lung field: Secondary | ICD-10-CM | POA: Diagnosis present

## 2018-12-01 DIAGNOSIS — N289 Disorder of kidney and ureter, unspecified: Secondary | ICD-10-CM

## 2018-12-01 DIAGNOSIS — C641 Malignant neoplasm of right kidney, except renal pelvis: Secondary | ICD-10-CM

## 2018-12-01 DIAGNOSIS — I2510 Atherosclerotic heart disease of native coronary artery without angina pectoris: Secondary | ICD-10-CM

## 2018-12-01 LAB — URINALYSIS, ROUTINE W REFLEX MICROSCOPIC
Bacteria, UA: NONE SEEN
Bilirubin Urine: NEGATIVE
Glucose, UA: NEGATIVE mg/dL
KETONES UR: NEGATIVE mg/dL
Leukocytes,Ua: NEGATIVE
Nitrite: NEGATIVE
Protein, ur: NEGATIVE mg/dL
Specific Gravity, Urine: 1.01 (ref 1.005–1.030)
Squamous Epithelial / HPF: NONE SEEN (ref 0–5)
pH: 6 (ref 5.0–8.0)

## 2018-12-01 LAB — COMPREHENSIVE METABOLIC PANEL
ALT: 13 U/L (ref 0–44)
AST: 25 U/L (ref 15–41)
Albumin: 3.7 g/dL (ref 3.5–5.0)
Alkaline Phosphatase: 58 U/L (ref 38–126)
Anion gap: 9 (ref 5–15)
BUN: 20 mg/dL (ref 8–23)
CO2: 26 mmol/L (ref 22–32)
Calcium: 9 mg/dL (ref 8.9–10.3)
Chloride: 102 mmol/L (ref 98–111)
Creatinine, Ser: 1.06 mg/dL (ref 0.61–1.24)
GFR calc Af Amer: >60 mL/min (ref >60)
Glucose, Bld: 122 mg/dL — ABNORMAL HIGH (ref 70–99)
Potassium: 3.7 mmol/L (ref 3.5–5.1)
Sodium: 137 mmol/L (ref 135–145)
Total Bilirubin: 1 mg/dL (ref 0.3–1.2)
Total Protein: 8 g/dL (ref 6.5–8.1)

## 2018-12-01 LAB — CBC WITH DIFFERENTIAL/PLATELET
Abs Immature Granulocytes: 0.04 10*3/uL (ref 0.00–0.07)
Basophils Absolute: 0.1 10*3/uL (ref 0.0–0.1)
Basophils Relative: 1 %
Eosinophils Absolute: 0.4 10*3/uL (ref 0.0–0.5)
Eosinophils Relative: 4 %
HCT: 37 % — ABNORMAL LOW (ref 39.0–52.0)
Hemoglobin: 12.1 g/dL — ABNORMAL LOW (ref 13.0–17.0)
Immature Granulocytes: 0 %
Lymphocytes Relative: 19 %
Lymphs Abs: 1.7 10*3/uL (ref 0.7–4.0)
MCH: 30.1 pg (ref 26.0–34.0)
MCHC: 32.7 g/dL (ref 30.0–36.0)
MCV: 92 fL (ref 80.0–100.0)
MONO ABS: 0.9 10*3/uL (ref 0.1–1.0)
Monocytes Relative: 11 %
Neutro Abs: 5.8 10*3/uL (ref 1.7–7.7)
Neutrophils Relative %: 65 %
Platelets: 192 10*3/uL (ref 150–400)
RBC: 4.02 MIL/uL — ABNORMAL LOW (ref 4.22–5.81)
RDW: 13.9 % (ref 11.5–15.5)
WBC: 9 10*3/uL (ref 4.0–10.5)
nRBC: 0 % (ref 0.0–0.2)

## 2018-12-01 LAB — PROTIME-INR
INR: 2.29
Prothrombin Time: 24.9 seconds — ABNORMAL HIGH (ref 11.4–15.2)

## 2018-12-01 LAB — GLUCOSE, CAPILLARY
Glucose-Capillary: 110 mg/dL — ABNORMAL HIGH (ref 70–99)
Glucose-Capillary: 100 mg/dL — ABNORMAL HIGH (ref 70–99)
Glucose-Capillary: 172 mg/dL — ABNORMAL HIGH (ref 70–99)
Glucose-Capillary: 100 mg/dL — ABNORMAL HIGH (ref 70–99)
Glucose-Capillary: 132 mg/dL — ABNORMAL HIGH (ref 70–99)

## 2018-12-01 LAB — PROCALCITONIN: Procalcitonin: <0.1 ng/mL

## 2018-12-01 LAB — APTT: aPTT: 53 seconds — ABNORMAL HIGH (ref 24–36)

## 2018-12-01 LAB — LACTATE DEHYDROGENASE: LDH: 284 U/L — ABNORMAL HIGH (ref 98–192)

## 2018-12-01 LAB — PHOSPHORUS: Phosphorus: 3.7 mg/dL (ref 2.5–4.6)

## 2018-12-01 LAB — URIC ACID: Uric Acid, Serum: 7 mg/dL (ref 3.7–8.6)

## 2018-12-01 LAB — CK: Total CK: 55 U/L (ref 49–397)

## 2018-12-01 LAB — BRAIN NATRIURETIC PEPTIDE: B Natriuretic Peptide: 340 pg/mL — ABNORMAL HIGH (ref 0.0–100.0)

## 2018-12-01 LAB — TROPONIN I: TROPONIN I: 0.03 ng/mL — AB (ref <0.03)

## 2018-12-01 LAB — MAGNESIUM: Magnesium: 2.1 mg/dL (ref 1.7–2.4)

## 2018-12-01 LAB — LACTIC ACID, PLASMA: LACTIC ACID, VENOUS: 1.2 mmol/L (ref 0.5–1.9)

## 2018-12-01 MED ORDER — LACTATED RINGERS IV SOLN
INTRAVENOUS | Status: DC
  Administered 2018-12-01: 03:00:00 via INTRAVENOUS

## 2018-12-01 MED ORDER — CARBAMIDE PEROXIDE 6.5 % OT SOLN
5.0000 [drp] | Freq: Two times a day (BID) | OTIC | Status: DC
  Administered 2018-12-01 – 2018-12-02 (×2): 5 [drp] via OTIC
  Filled 2018-12-01: qty 15

## 2018-12-01 MED ORDER — LATANOPROST 0.005 % OP SOLN
1.0000 [drp] | Freq: Every day | OPHTHALMIC | Status: DC
  Administered 2018-12-01: 21:00:00 1 [drp] via OPHTHALMIC
  Filled 2018-12-01: qty 2.5

## 2018-12-01 MED ORDER — WARFARIN SODIUM 6 MG PO TABS: 6.0000 mg | ORAL_TABLET | ORAL | Status: DC

## 2018-12-01 MED ORDER — MORPHINE SULFATE (PF) 2 MG/ML IV SOLN: 2.0000 mg | INTRAVENOUS | Status: DC | PRN

## 2018-12-01 MED ORDER — ACETAMINOPHEN 325 MG PO TABS
650.0000 mg | ORAL_TABLET | Freq: Four times a day (QID) | ORAL | Status: DC | PRN
  Administered 2018-12-01: 02:00:00 650 mg via ORAL
  Filled 2018-12-01: qty 2

## 2018-12-01 MED ORDER — BISACODYL 5 MG PO TBEC: 5.0000 mg | DELAYED_RELEASE_TABLET | Freq: Every day | ORAL | Status: DC | PRN

## 2018-12-01 MED ORDER — ASPIRIN EC 81 MG PO TBEC
81.0000 mg | DELAYED_RELEASE_TABLET | Freq: Every day | ORAL | Status: DC
  Administered 2018-12-01 – 2018-12-02 (×2): 81 mg via ORAL
  Filled 2018-12-01 (×2): qty 1

## 2018-12-01 MED ORDER — INSULIN ASPART 100 UNIT/ML ~~LOC~~ SOLN
0.0000 [IU] | Freq: Three times a day (TID) | SUBCUTANEOUS | Status: DC
  Administered 2018-12-02 (×2): 2 [IU] via SUBCUTANEOUS
  Filled 2018-12-01 (×2): qty 1

## 2018-12-01 MED ORDER — PROMETHAZINE HCL 25 MG/ML IJ SOLN: 12.5000 mg | Freq: Four times a day (QID) | INTRAMUSCULAR | Status: DC | PRN

## 2018-12-01 MED ORDER — PANTOPRAZOLE SODIUM 40 MG PO TBEC
40.0000 mg | DELAYED_RELEASE_TABLET | Freq: Every day | ORAL | Status: DC
  Administered 2018-12-01 – 2018-12-02 (×2): 40 mg via ORAL
  Filled 2018-12-01 (×2): qty 1

## 2018-12-01 MED ORDER — SENNOSIDES-DOCUSATE SODIUM 8.6-50 MG PO TABS: 1.0000 | ORAL_TABLET | Freq: Every evening | ORAL | Status: DC | PRN

## 2018-12-01 MED ORDER — MORPHINE SULFATE (PF) 4 MG/ML IV SOLN: 4.0000 mg | INTRAVENOUS | Status: DC | PRN

## 2018-12-01 MED ORDER — DIPHENHYDRAMINE HCL 25 MG PO CAPS: 25.0000 mg | ORAL_CAPSULE | Freq: Three times a day (TID) | ORAL | Status: DC | PRN

## 2018-12-01 MED ORDER — LEVOTHYROXINE SODIUM 50 MCG PO TABS
50.0000 ug | ORAL_TABLET | Freq: Every day | ORAL | Status: DC
  Administered 2018-12-01 – 2018-12-02 (×2): 50 ug via ORAL
  Filled 2018-12-01 (×2): qty 1

## 2018-12-01 MED ORDER — INSULIN ASPART 100 UNIT/ML ~~LOC~~ SOLN: 0.0000 [IU] | Freq: Every day | SUBCUTANEOUS | Status: DC

## 2018-12-01 MED ORDER — METOPROLOL TARTRATE 25 MG PO TABS
12.5000 mg | ORAL_TABLET | Freq: Two times a day (BID) | ORAL | Status: DC
  Administered 2018-12-01 – 2018-12-02 (×4): 12.5 mg via ORAL
  Filled 2018-12-01 (×4): qty 1

## 2018-12-01 MED ORDER — SIMVASTATIN 20 MG PO TABS
20.0000 mg | ORAL_TABLET | Freq: Every day | ORAL | Status: DC
  Administered 2018-12-01: 18:00:00 20 mg via ORAL
  Filled 2018-12-01: qty 1

## 2018-12-01 MED ORDER — WARFARIN SODIUM 4 MG PO TABS
4.5000 mg | ORAL_TABLET | ORAL | Status: DC
  Administered 2018-12-01: 18:00:00 4.5 mg via ORAL
  Filled 2018-12-01: qty 1

## 2018-12-01 MED ORDER — WARFARIN - PHARMACIST DOSING INPATIENT: Freq: Every day | Status: DC

## 2018-12-01 MED ORDER — METFORMIN HCL 500 MG PO TABS
1000.0000 mg | ORAL_TABLET | Freq: Every day | ORAL | Status: DC
  Administered 2018-12-02: 1000 mg via ORAL
  Filled 2018-12-01: qty 2

## 2018-12-01 NOTE — Progress Notes (Signed)
Discharge Progress Report  Patient Details  Name: [name] [name] MRN: [id number] Date of Birth: 14-Sep-1934 Referring Provider:     Cardiac Rehab from 09/22/2018 in [facility] Cardiac and Pulmonary Rehab  Referring Provider  Kathlyn Sacramento MD       Number of Visits: 35/36  Reason for Discharge:  Patient reached a stable level of exercise. Patient independent in their exercise.  Smoking History:  Social History   Tobacco Use  Smoking Status Former Smoker  . Types: Cigars, Cigarettes  . Last attempt to quit: 10/02/1974  . Years since quitting: 44.1  Smokeless Tobacco Never Used  Tobacco Comment   still smokes cigars once a day    Diagnosis:  No diagnosis found.  ADL [facility]:   Initial Exercise Prescription: Initial Exercise Prescription - 09/22/18 1400      Date of Initial Exercise RX and Referring Provider   Date  09/22/18    Referring Provider  Kathlyn Sacramento MD      Treadmill   MPH  1.3    Grade  0.5    Minutes  15    METs  2.09      NuStep   Level  1    SPM  80    Minutes  15    METs  2      Recumbant Elliptical   Level  1    RPM  50    Minutes  15    METs  2      Prescription Details   Frequency (times per week)  3    Duration  Progress to 45 minutes of aerobic exercise without signs/symptoms of physical distress      Intensity   THRR 40-80% of Max Heartrate  103-126    Ratings of Perceived Exertion  11-13    Perceived Dyspnea  0-4      Progression   Progression  Continue to progress workloads to maintain intensity without signs/symptoms of physical distress.      Resistance Training   Training Prescription  Yes    Weight  3 lbs    Reps  10-15       Discharge Exercise Prescription (Final Exercise Prescription Changes): Exercise Prescription Changes - 11/18/18 1100      Response to Exercise   Blood Pressure (Admit)  112/56    Blood Pressure (Exercise)  150/82    Blood Pressure (Exit)  130/80    Heart Rate (Admit)  66 bpm    Heart  Rate (Exercise)  83 bpm    Heart Rate (Exit)  65 bpm    Rating of Perceived Exertion (Exercise)  15    Symptoms  none    Duration  Continue with 45 min of aerobic exercise without signs/symptoms of physical distress.    Intensity  Other (comment)   use RPE     Progression   Progression  Continue to progress workloads to maintain intensity without signs/symptoms of physical distress.    Average METs  2      Resistance Training   Training Prescription  Yes    Weight  3 lb    Reps  10-15      NuStep   Level  3    SPM  80    Minutes  15      Recumbant Elliptical   Level  1    RPM  50    Minutes  15    METs  1.7       Functional  Capacity: 6 Minute Walk    Row Name 09/22/18 1352 11/26/18 1727       6 Minute Walk   Phase  Initial  Discharge    Distance  1015 feet  1135 feet    Distance % Change  -  11.82 %    Distance Feet Change  -  120 ft    Walk Time  6 minutes  6 minutes    # of Rest Breaks  -  0    MPH  1.92  2.14    METS  2  1.8    RPE  13  15    Perceived Dyspnea   1  1    VO2 Peak  7.01  6.33    Symptoms  Yes (comment)  Yes (comment)    Comments  SOB, unsteady gait in shoes  Some shortness of breath    Resting HR  80 bpm  86 bpm    Resting BP  130/70  128/70    Resting Oxygen Saturation   98 %  -    Exercise Oxygen Saturation  during 6 min walk  94 %  -    Max Ex. HR  107 bpm  130 bpm    Max Ex. BP  156/76  142/60    2 Minute Post BP  134/64  -       Psychological, QOL, Others - Outcomes: PHQ 2/9: Depression screen [facility] 2/9 09/22/2018 07/08/2018 11/11/2017 11/11/2017 10/30/2016  Decreased Interest 0 0 0 0 0  Down, Depressed, Hopeless 0 0 0 0 0  PHQ - 2 Score 0 0 0 0 0  Altered sleeping 0 - 0 - -  Tired, decreased energy 1 - 0 - -  Change in appetite 0 - 0 - -  Feeling bad or failure about yourself  0 - 0 - -  Trouble concentrating 0 - 0 - -  Moving slowly or fidgety/restless 0 - 0 - -  Suicidal thoughts 0 - 0 - -  PHQ-9 Score 1 - 0 - -  Difficult  doing work/chores Not difficult at all - Not difficult at all - -  Some recent data might be hidden    Quality of Life: Quality of Life - 09/22/18 1340      Quality of Life   Select  Quality of Life      Quality of Life Scores   Health/Function Pre  23.14 %    Socioeconomic Pre  26.36 %    Psych/Spiritual Pre  28.29 %    Family Pre  30 %    GLOBAL Pre  28.95 %       Personal Goals: Goals established at orientation with interventions provided to work toward goal. Personal Goals and Risk Factors at Admission - 09/22/18 1305      Core Components/Risk Factors/Patient Goals on Admission    Weight Management  Yes;Weight Maintenance    Intervention  Weight Management: Develop a combined nutrition and exercise program designed to reach desired caloric intake, while maintaining appropriate intake of nutrient and fiber, sodium and fats, and appropriate energy expenditure required for the weight goal.;Weight Management: Provide education and appropriate resources to help participant work on and attain dietary goals.    Admit Weight  185 lb (83.9 kg)    Expected Outcomes  Short Term: Continue to assess and modify interventions until short term weight is achieved;Long Term: Adherence to nutrition and physical activity/exercise program aimed toward attainment of established weight goal;Weight  Maintenance: Understanding of the daily nutrition guidelines, which includes 25-35% calories from fat, 7% or less cal from saturated fats, less than 25m cholesterol, less than 1.5gm of sodium, & 5 or more servings of fruits and vegetables daily;Understanding recommendations for meals to include 15-35% energy as protein, 25-35% energy from fat, 35-60% energy from carbohydrates, less than 2030mof dietary cholesterol, 20-35 gm of total fiber daily;Understanding of distribution of calorie intake throughout the day with the consumption of 4-5 meals/snacks    Diabetes  Yes    Intervention  Provide education about  signs/symptoms and action to take for hypo/hyperglycemia.;Provide education about proper nutrition, including hydration, and aerobic/resistive exercise prescription along with prescribed medications to achieve blood glucose in normal ranges: Fasting glucose 65-99 mg/dL    Expected Outcomes  Short Term: Participant verbalizes understanding of the signs/symptoms and immediate care of hyper/hypoglycemia, proper foot care and importance of medication, aerobic/resistive exercise and nutrition plan for blood glucose control.;Long Term: Attainment of HbA1C < 7%.    Lipids  Yes    Intervention  Provide education and support for participant on nutrition & aerobic/resistive exercise along with prescribed medications to achieve LDL <7061mHDL >89m57m  Expected Outcomes  Short Term: Participant states understanding of desired cholesterol values and is compliant with medications prescribed. Participant is following exercise prescription and nutrition guidelines.;Long Term: Cholesterol controlled with medications as prescribed, with individualized exercise RX and with personalized nutrition plan. Value goals: LDL < 70mg27mL > 40 mg.        Personal Goals Discharge: Goals and Risk Factor Review    Row Name 10/23/18 1640 11/24/18 1623           Core Components/Risk Factors/Patient Goals Review   Personal Goals Review  Weight Management/Obesity;Improve shortness of breath with ADL's;Diabetes;Hypertension;Lipids  Weight Management/Obesity;Improve shortness of breath with ADL's;Diabetes;Hypertension;Lipids      Review  Ikaika is taking meds as directed.  He has a BP cuff at home but doesnt use it.  His BG at home has been 130-140 ( he admits sneaking chocolate to eat)  He has noticed he can carry full load of laundry and groceries easier than before starting HT.  Geovanni is taking all meds as directed.  He will start checking BP at home at least on [facility] is still running around 140.  Refoel had A1c 3  months ago and his Dr was satisfied with it.  He still admits eating chocolate and sweets"more than I should."        Expected Outcomes  Short - check BP at home once a day Long - monitor BP, BG at home as well as follow Dr advice to achieve optimal level  Short - [facility] - He plans to join a gym wih his wife after graduation         Exercise Goals and Review: Exercise Goals    Row Name 09/22/18 1452             Exercise Goals   Increase Physical Activity  Yes       Intervention  Provide advice, education, support and counseling about physical activity/exercise needs.;Develop an individualized exercise prescription for aerobic and resistive training based on initial evaluation findings, risk stratification, comorbidities and participant's personal goals.       Expected Outcomes  Short Term: Attend rehab on a regular basis to increase amount of physical activity.;Long Term: Add in home exercise to make  exercise part of routine and to increase amount of physical activity.;Long Term: Exercising regularly at least 3-5 days a week.       Increase Strength and Stamina  Yes       Intervention  Provide advice, education, support and counseling about physical activity/exercise needs.;Develop an individualized exercise prescription for aerobic and resistive training based on initial evaluation findings, risk stratification, comorbidities and participant's personal goals.       Expected Outcomes  Short Term: Increase workloads from initial exercise prescription for resistance, speed, and METs.;Short Term: Perform resistance training exercises routinely during rehab and add in resistance training at home;Long Term: Improve cardiorespiratory fitness, muscular endurance and strength as measured by increased METs and functional capacity (6MWT)       Able to understand and use rate of perceived exertion (RPE) scale  Yes       Intervention  Provide education and explanation on how to use  RPE scale       Expected Outcomes  Short Term: Able to use RPE daily in rehab to express subjective intensity level;Long Term:  Able to use RPE to guide intensity level when exercising independently       Able to understand and use Dyspnea scale  Yes       Intervention  Provide education and explanation on how to use Dyspnea scale       Expected Outcomes  Short Term: Able to use Dyspnea scale daily in rehab to express subjective sense of shortness of breath during exertion;Long Term: Able to use Dyspnea scale to guide intensity level when exercising independently       Knowledge and understanding of Target Heart Rate Range (THRR)  Yes       Intervention  Provide education and explanation of THRR including how the numbers were predicted and where they are located for reference       Expected Outcomes  Short Term: Able to state/look up THRR;Short Term: Able to use daily as guideline for intensity in rehab;Long Term: Able to use THRR to govern intensity when exercising independently       Able to check pulse independently  Yes       Intervention  Provide education and demonstration on how to check pulse in carotid and radial arteries.;Review the importance of being able to check your own pulse for safety during independent exercise       Expected Outcomes  Short Term: Able to explain why pulse checking is important during independent exercise;Long Term: Able to check pulse independently and accurately       Understanding of Exercise Prescription  Yes       Intervention  Provide education, explanation, and written materials on patient's individual exercise prescription       Expected Outcomes  Long Term: Able to explain home exercise prescription to exercise independently;Short Term: Able to explain program exercise prescription          Exercise Goals Re-Evaluation: Exercise Goals Re-Evaluation    Row Name 10/07/18 0950 10/22/18 1213 10/23/18 1639 11/05/18 1510 11/18/18 1159     Exercise Goal  Re-Evaluation   Exercise Goals Review  Increase Physical Activity;Increase Strength and Stamina;Able to understand and use rate of perceived exertion (RPE) scale;Knowledge and understanding of Target Heart Rate Range (THRR);Understanding of Exercise Prescription  Increase Physical Activity;Increase Strength and Stamina;Able to understand and use rate of perceived exertion (RPE) scale;Knowledge and understanding of Target Heart Rate Range (THRR);Understanding of Exercise Prescription  Increase Physical Activity;Increase Strength and Stamina;Able  to understand and use rate of perceived exertion (RPE) scale;Knowledge and understanding of Target Heart Rate Range (THRR);Able to check pulse independently;Understanding of Exercise Prescription  Increase Physical Activity;Increase Strength and Stamina;Able to understand and use rate of perceived exertion (RPE) scale;Knowledge and understanding of Target Heart Rate Range (THRR)  Increase Physical Activity;Increase Strength and Stamina;Able to understand and use rate of perceived exertion (RPE) scale;Able to understand and use Dyspnea scale;Understanding of Exercise Prescription   Comments  Orlin has tolerated exercise well.  Staff will monitor progress.   Waris is able to do 45 min of cardiovascular exercise.  He has improved MET level slightly.  Staff will monitor progress.  Reviewed home exercise with pt today.  Pt plans to walk for exercise.  Reviewed THR, pulse, RPE, sign and symptoms, NTG use, and when to call 911 or MD.  Also discussed weather considerations and indoor options.  Pt voiced understanding.  Billey attends consistently and is up to level 3 on NS.  he reports RPE of 15-17 on TM so has not increased speed or grade yet.  Staff will monitor progress.  Lawton attends consistently and works in correct RPE range. He has a pacemaker that limits HR.     Expected Outcomes  Short - increase levels on NS and XR Long - improve overall MET level  Short - continue to attend  consistently Long - improve MET level  Short - add one day walking when weather permits Long - exercise independently  Short - become more comfortable on TM Long - increase overall MET level  Short - work on reducing RPE on current level Long - increase levels on all machines      Nutrition & Weight - Outcomes: Pre Biometrics - 09/22/18 1453      Pre Biometrics   Height  5' 8.4" (1.737 m)    Weight  186 lb 14.4 oz (84.8 kg)    Waist Circumference  40 inches    Hip Circumference  40.5 inches    Waist to Hip Ratio  0.99 %    BMI (Calculated)  28.1    Single Leg Stand  15.44 seconds      Post Biometrics - 11/26/18 1731       Post  Biometrics   Height  5' 8.4" (1.737 m)    Weight  194 lb 8 oz (88.2 kg)    Waist Circumference  40 inches    Hip Circumference  40.5 inches    Waist to Hip Ratio  0.99 %    BMI (Calculated)  29.24       Nutrition: Nutrition Therapy & Goals - 09/25/18 1044      Nutrition Therapy   Diet  DM    Drug/Food Interactions  Coumadin/Vit K    Protein (specify units)  10oz    Fiber  30 grams    Whole Grain Foods  3 servings   eats white/wheat bread    Saturated Fats  13 max. grams    Fruits and Vegetables  5 servings/day   8 ideal; eats small portions but does try to eat fruits and vegetables daily   Sodium  2000 grams      Personal Nutrition Goals   Nutrition Goal  Drink one additional glass of water per day    Personal Goal #2  Use 1/2 of a cup as as guide when portioning out foods that contain Vitamin K so that you eat the same amounts each time    Personal  Goal #3  Start to think about ways to decrease sodium in your diet    Comments  His wife is responsible for cooking and providing most food for pt. His appetite is improving post surgery and wt is returning to UBW range after losing approx 8# per wife's estimation. (06/2018) HgbA1c 5.5. Breakfast: almond honey bunches of oats with 2% milk + dried cherries or other fruit, sometimes a biscuit. Lunch:  "main meal" baked potato, beans, chili beans, ham or egg salad sandwich. Dinner: soup, sandwich, nuts, cheese, mini bagel with cream cheese, casserol occasionlly (prepared by daughter). Snacks: sweets, pretzels, chocolate. They do not eat many fried foods and he is trying to cut down on sweets.      Intervention Plan   Intervention  Prescribe, educate and counsel regarding individualized specific dietary modifications aiming towards targeted core components such as weight, hypertension, lipid management, diabetes, heart failure and other comorbidities.    Expected Outcomes  Short Term Goal: Understand basic principles of dietary content, such as calories, fat, sodium, cholesterol and nutrients.;Long Term Goal: Adherence to prescribed nutrition plan.;Short Term Goal: A plan has been developed with personal nutrition goals set during dietitian appointment.       Nutrition Discharge: Nutrition Assessments - 09/22/18 1341      MEDFICTS Scores   Pre Score  47       Education Questionnaire Score: Knowledge Questionnaire Score - 09/22/18 1306      Knowledge Questionnaire Score   Pre Score  21/25   correct answers reviewed with Galile[employer]. Focus on anatomy, nutrition, exercise      Goals reviewed with patient; copy given to patient.

## 2018-12-01 NOTE — Progress Notes (Addendum)
ANTICOAGULATION CONSULT NOTE - Initial Consult  Pharmacy Consult for warfarin dosing Indication: chronic afib s/p mechanical aortic and mitral valve replacement @ [facility] 2019  Allergies  Allergen Reactions  . Macrolides And Ketolides Other (See Comments)  . Mycinettes   . Nitrofuran Derivatives Other (See Comments)  . Nitrofurantoin Other (See Comments)  . Erythromycin Itching and Rash    Other reaction(s): UNKNOWN  And red skin  . Zofran [Ondansetron Hcl] Rash    Patient Measurements: Height: 5\' 9"  (175.3 cm) Weight: 184 lb 11.9 oz (83.8 kg) IBW/kg (Calculated) : 70.7   Vital Signs: Temp: 98.1 F (36.7 C) (02/17 0100) Temp Source: Oral (02/17 0100) BP: 131/59 (02/17 0100) Pulse Rate: 73 (02/17 0100)  Labs: Recent Labs    12/01/18 0045 12/01/18 0129  HGB 12.1*  --   HCT 37.0*  --   PLT 192  --   APTT  --  53*  LABPROT  --  24.9*  INR  --  2.29  CREATININE 1.06  --   CKTOTAL 55  --   TROPONINI 0.03*  --     Estimated Creatinine Clearance: 52.8 mL/min (by C-G formula based on SCr of 1.06 mg/dL).   Medical History: Past Medical History:  Diagnosis Date  . Atherosclerosis of aorta ([place])    by CT scan in past  . Atrial fibrillation ([place])    and aflutter. pt has a left atrial circuit that is not ablated. was on amiodarone-stopped, now use rate control.   . Bladder cancer ([place])    hx; treated with BCG in past   . Carotid artery disease ([place])    There was calcified plaque but no stenosis by carotid artery screening done at [facility] October, 2013  . CHB (complete heart block) ([place])    a. 05/2018 s/p BSX VVIR PPM ([facility]) in setting of bradycardia following myomectomy and AVR/MVR.  . Diabetes mellitus    type not specified  . Diabetic neuropathy ([facility])    feet  . Elevated liver enzymes    over time; hx  . Excessive sweating   . Glaucoma   . Gout   . Head injury    when slipped on ice 2004-2005. stabilized and back on Coumadin   . Headache    migraines - distant past  . History of cardiac cath    a. 05/2018 Cath ([facility]): Non-obstructive CAD. Mildly elevated filling pressures w/ nl CI.  [name] HOCM (hypertrophic obstructive cardiomyopathy) ([place])    a. 01/2016 Echo: EF 65-70%, no rwma, LVOT gradient of 80-78mmHg, mod AS, SAM; b. 11/2017 Echo: EF 55-60%, near cavity obliteration in systole, mod AS, mild MS; c. 04/2018 Card MRI ([facility]): Sev LVH, EF >70%, Sev AS, LVOT obs w/ Sev MR, mild to mod TR/PR, mid-myocardial HE in basal-mid anteroseptum and inferoseptum; d. 05/2018 s/p Septal myomectomy; e. 05/2018 Echo: Nl EF w/ sev LVH. Mech AoV and MV.  . Homocystinemia ([place])    elevated, mild   . Hypercholesterolemia    treated.   . Hypertension   . Kidney mass    laproscopic surgery woth cryoablation of a mass outside kidney followed at [facility].   . Moderate aortic stenosis    a. 01/2016 echo: mod AS.   [name] Motion sickness    ocean boats  . Nausea   . Obstructive sleep apnea    CPAP started successfully 2014  . Orthostatic hypotension    a. orthostatic. dehydration. hospitalized 11/11  . Sleep apnea    Significant  obstructive sleep apnea diagnosed in August, 2012, the patient is to receive CPAP   . Stroke [facility])    "2 old strokes" CT and MRI. [place] [facility] 11/11. diagnosis was dehydration, no acutal reports.   . TSH elevation    on synthroid historically   . Unsteady gait    August, 2012    Medications:  Scheduled:  . aspirin EC  81 mg Oral Daily  . insulin aspart  0-15 Units Subcutaneous TID WC  . insulin aspart  0-5 Units Subcutaneous QHS  . latanoprost  1 drop Both Eyes QHS  . levothyroxine  50 mcg Oral Daily  . metoprolol tartrate  12.5 mg Oral BID  . pantoprazole  40 mg Oral Daily  . simvastatin  20 mg Oral q1800  . warfarin  4.5 mg Oral Once per day on Sun Mon Tue Wed Thu Sat   And  . [START ON 12/05/2018] warfarin  6 mg Oral Q Fri-1800    Assessment: Patient directly admitted from home for dizziness. Is seen at [facility], but  considering wait list is long and symptoms are non-specific, came to [facility]. Patient has a h/o of chronic afib and is s/p aortic/mitral mechanical valve replacement at [facility] and is anticoagulation on warfarin PTA.  Home dose: Warfarin 4.5 mg Mon-Tues-Wed-Thurs-Sat-Sun Warfarin 6 mg Fri  02/16 @ 0130 INR 2.29 slightly subtherapeutic from goal; patient was supratherapeutic 02/03 @ 3.7 at the anti-coag visit  Goal of Therapy:  INR goal 2.5 - 3.5 Monitor platelets by anticoagulation protocol: Yes   Plan:  Although patient's INR is currently slightly subtherapeutic will continue on patient's home regimen as above, considering he has had two consecutively supratherapeutic INRs (4.1 >> 3.7 >> 2.29 (today's)). Will monitor daily CBC's and adjust per INR trends. Patient not on any abx or medication aside from simvastatin that interacts with warfarin.  Tobie Lords, PharmD, BCPS Clinical Pharmacist 12/01/2018

## 2018-12-01 NOTE — Telephone Encounter (Signed)
[name] [name]'s wife called to say that they found nodules in his lungs. Was on vacation in [place] and felt dizzy and she called 911. Was transferred to [facility] via ambulance and is in Room 130.

## 2018-12-01 NOTE — Assessment & Plan Note (Addendum)
85 year old male patient with multiple medical problems including CAD/mechanical valve on anticoagulation with Coumadin; history of kidney cancer; currently admitted to hospital for continued dizziness/incidentally noted to have mediastinal adenopathy/kidney mass  # CT Feb 2020- [OSH]Mediastinal adenopathy/subcarinal lymph node-approximately 2.5 to 3 cm/also-supraclavicular lymphadenopathy;also [sub-cm RML/LUL/LLL ~ 0.4-0.8 cm]-the etiology is unclear.  Bilateral lung nodules [up to 0.4cm in size]; up to 1.5-1.7cm mediastinal adenopathy present on previous CT scan in dec 2018.  However on the most recent CT scan/OSH although the mediastinal LN slightly larger. Will need PET scan/ biopsy as deemed appropriate.    # ~2.2 cm calcified lesion noted in the left kidney; ? Etiology; right kidney cancer-s/p cryo at [facility]   #Cirrhotic moprhology of liver on CT scan-stable  # Coronary artery disease/HOCM/artificial mechanical valve on Coumadin/Dr.Arida.  Stable will reach out to Dr. Velva Harman regarding bridging with Lovenox if /when planning for biopsy.  #Dizziness/vertigo-unclear etiology status post CT head/outside hospital-no acute process.  Improved.  Plan: I have hand-delivered the CT scan from outside hospital to the [facility] radiology department [[email or web address]]; to have images uploaded.  We will also review at the tumor conference.  Patient could be discharged from hospital from oncology standpoint.  We will make follow-up appointments  In 1-2 weeks.

## 2018-12-01 NOTE — Progress Notes (Signed)
O2 @ 2 lpm interfaced with cpap due to desat. Present sat is 94%. Pt asleep & in no distress.

## 2018-12-01 NOTE — Progress Notes (Signed)
Cardiac Individual Treatment Plan  Patient Details  Name: [name] [name] MRN: [id number] Date of Birth: 11-Apr-1934 Referring Provider:     Cardiac Rehab from 09/22/2018 in [facility] Cardiac and Pulmonary Rehab  Referring Provider  Kathlyn Sacramento MD      Initial Encounter Date:    Cardiac Rehab from 09/22/2018 in [facility] Cardiac and Pulmonary Rehab  Date  09/22/18      Visit Diagnosis: No diagnosis found.  Patient's Home Medications on Admission: No current facility-administered medications for this visit.  No current outpatient medications on file.  Facility-Administered Medications Ordered in Other Visits:  .  acetaminophen (TYLENOL) tablet 650 mg, 650 mg, Oral, Q6H PRN, Jodell Cipro, Prasanna, MD, 650 mg at 12/01/18 0200 .  aspirin EC tablet 81 mg, 81 mg, Oral, Daily, Jodell Cipro, Prasanna, MD, 81 mg at 12/01/18 1007 .  bisacodyl (DULCOLAX) EC tablet 5 mg, 5 mg, Oral, Daily PRN, Arta Silence, MD .  diphenhydrAMINE (BENADRYL) capsule 25 mg, 25 mg, Oral, TID PRN, Fritzi Mandes, MD .  insulin aspart (novoLOG) injection 0-15 Units, 0-15 Units, Subcutaneous, TID WC, Sridharan, Prasanna, MD .  insulin aspart (novoLOG) injection 0-5 Units, 0-5 Units, Subcutaneous, QHS, Sridharan, Prasanna, MD .  latanoprost (XALATAN) 0.005 % ophthalmic solution 1 drop, 1 drop, Both Eyes, QHS, Sridharan, Prasanna, MD .  levothyroxine (SYNTHROID, LEVOTHROID) tablet 50 mcg, 50 mcg, Oral, Daily, Jodell Cipro, Prasanna, MD, 50 mcg at 12/01/18 1007 .  [START ON 12/02/2018] metFORMIN (GLUCOPHAGE) tablet 1,000 mg, 1,000 mg, Oral, Q breakfast, Fritzi Mandes, MD .  metoprolol tartrate (LOPRESSOR) tablet 12.5 mg, 12.5 mg, Oral, BID, Jodell Cipro, Prasanna, MD, 12.5 mg at 12/01/18 1007 .  pantoprazole (PROTONIX) EC tablet 40 mg, 40 mg, Oral, Daily, Jodell Cipro, Prasanna, MD, 40 mg at 12/01/18 1007 .  promethazine (PHENERGAN) injection 12.5 mg, 12.5 mg, Intravenous, Q6H PRN, Jodell Cipro, Prasanna, MD .  senna-docusate (Senokot-S)  tablet 1 tablet, 1 tablet, Oral, QHS PRN, Arta Silence, MD .  simvastatin (ZOCOR) tablet 20 mg, 20 mg, Oral, q1800, Jodell Cipro, Prasanna, MD .  warfarin (COUMADIN) tablet 4.5 mg, 4.5 mg, Oral, Once per day on Sun Mon Tue Wed Thu Sat **AND** [START ON 12/05/2018] warfarin (COUMADIN) tablet 6 mg, 6 mg, Oral, Q Fri-1800, Arta Silence, MD .  Warfarin - Pharmacist Dosing Inpatient, , Does not apply, [id number], Arta Silence, MD  Past Medical History: Past Medical History:  Diagnosis Date  . Atherosclerosis of aorta ([place])    by CT scan in past  . Atrial fibrillation ([place])    and aflutter. pt has a left atrial circuit that is not ablated. was on amiodarone-stopped, now use rate control.   . Bladder cancer ([place])    hx; treated with BCG in past   . Carotid artery disease ([place])    There was calcified plaque but no stenosis by carotid artery screening done at [facility] October, 2013  . CHB (complete heart block) ([place])    a. 05/2018 s/p BSX VVIR PPM ([facility]) in setting of bradycardia following myomectomy and AVR/MVR.  . Diabetes mellitus    type not specified  . Diabetic neuropathy (H[facility])    feet  . Elevated liver enzymes    over time; hx  . Excessive sweating   . Glaucoma   . Gout   . Head injury    when slipped on ice 2004-2005. stabilized and back on Coumadin   . Headache    migraines - distant past  . History of cardiac cath    a. 05/2018 Cath (  [facility]): Non-obstructive CAD. Mildly elevated filling pressures w/ nl CI.  [name] HOCM (hypertrophic obstructive cardiomyopathy) ([place])    a. 01/2016 Echo: EF 65-70%, no rwma, LVOT gradient of 80-43mHg, mod AS, SAM; b. 11/2017 Echo: EF 55-60%, near cavity obliteration in systole, mod AS, mild MS; c. 04/2018 Card MRI ([facility]): Sev LVH, EF >70%, Sev AS, LVOT obs w/ Sev MR, mild to mod TR/PR, mid-myocardial HE in basal-mid anteroseptum and inferoseptum; d. 05/2018 s/p Septal myomectomy; e. 05/2018 Echo: Nl EF w/ sev LVH. Mech AoV and MV.  .  [place]    elevated, mild   . Hypercholesterolemia    treated.   . Hypertension   . Kidney mass    laproscopic surgery woth cry[facility]ablation of [facility]   . Moderate aortic stenosis    a. 01/2016 echo: mod AS.   .Marland KitchenMotion sickness    ocean boats  . Nausea   . Obstructive sleep apnea    CPAP started successfully 2014  . Orthostatic hypotension    a. orthostatic. dehydration. hospitalized 11/11  . Sleep apnea    Significant obstructive sleep apnea diagnosed in August, 2012, the patient is to receive CPAP   . Stroke ([facility]    "2 old strokes" CT and MRI. [facility] 11/11. diagnosis was dehydration, no acutal reports.   . TSH elevation    on synthroid historically   . Unsteady gait    August, 2012    Tobacco Use: Social History   Tobacco Use  Smoking Status Former Smoker  . Types: Cigars, Cigarettes  . Last attempt to quit: 10/02/1974  . Years since quitting: 44.1  Smokeless Tobacco Never Used  Tobacco Comment   still smokes cigars once a day    Labs: Recent Review Flowsheet Data    Labs for ITP Cardiac and Pulmonary Rehab Latest Ref Rng & Units 12/10/2016 03/30/2017 08/05/2017 11/12/2017 07/10/2018   Cholestrol 100 - 199 mg/dL 168 137 - 147 -   LDLCALC 0 - 99 mg/dL 96 70 - 82 -   HDL >39 mg/dL 41 36 - 41 -   Trlycerides 0 - 149 mg/dL 155(H) 153 - 120 -   Hemoglobin A1c 4.8 - 5.6 % 6.6(H) 7.4 6.5 6.3(H) 5.5       Exercise Target Goals: Exercise Program Goal: Individual exercise prescription set using results from initial 6 min walk test and THRR while considering  patient's activity barriers and safety.   Exercise Prescription Goal: Initial exercise prescription builds to 30-45 minutes a day of aerobic activity, 2-3 days per week.  Home exercise guidelines will be given to patient during program as part of exercise prescription that the participant will acknowledge.  Activity Barriers & Risk Stratification: Activity  Barriers & Cardiac Risk Stratification - 09/22/18 1341      Activity Barriers & Cardiac Risk Stratification   Activity Barriers  Decreased Ventricular Function;Arthritis;Deconditioning;Muscular Weakness   left knee pain from dislocation in college   Cardiac Risk Stratification  High       6 Minute Walk: 6 Minute Walk    Row Name 09/22/18 1352 11/26/18 1727       6 Minute Walk   Phase  Initial  Discharge    Distance  1015 feet  1135 feet    Distance % Change  -  11.82 %    Distance Feet Change  -  120 ft    Walk Time  6 minutes  6 minutes    #  of Rest Breaks  -  0    MPH  1.92  2.14    METS  2  1.8    RPE  13  15    Perceived Dyspnea   1  1    VO2 Peak  7.01  6.33    Symptoms  Yes (comment)  Yes (comment)    Comments  SOB, unsteady gait in shoes  Some shortness of breath    Resting HR  80 bpm  86 bpm    Resting BP  130/70  128/70    Resting Oxygen Saturation   98 %  -    Exercise Oxygen Saturation  during 6 min walk  94 %  -    Max Ex. HR  107 bpm  130 bpm    Max Ex. BP  156/76  142/60    2 Minute Post BP  134/64  -       Oxygen Initial Assessment:   Oxygen Re-Evaluation:   Oxygen Discharge (Final Oxygen Re-Evaluation):   Initial Exercise Prescription: Initial Exercise Prescription - 09/22/18 1400      Date of Initial Exercise RX and Referring Provider   Date  09/22/18    Referring Provider  Kathlyn Sacramento MD      Treadmill   MPH  1.3    Grade  0.5    Minutes  15    METs  2.09      NuStep   Level  1    SPM  80    Minutes  15    METs  2      Recumbant Elliptical   Level  1    RPM  50    Minutes  15    METs  2      Prescription Details   Frequency (times per week)  3    Duration  Progress to 45 minutes of aerobic exercise without signs/symptoms of physical distress      Intensity   THRR 40-80% of Max Heartrate  103-126    Ratings of Perceived Exertion  11-13    Perceived Dyspnea  0-4      Progression   Progression  Continue to progress  workloads to maintain intensity without signs/symptoms of physical distress.      Resistance Training   Training Prescription  Yes    Weight  3 lbs    Reps  10-15       Perform Capillary Blood Glucose checks as needed.  Exercise Prescription Changes: Exercise Prescription Changes    Row Name 09/22/18 1400 10/07/18 0900 10/22/18 1200 11/05/18 1500 11/18/18 1100     Response to Exercise   Blood Pressure (Admit)  130/70  130/80  124/70  138/64  112/56   Blood Pressure (Exercise)  156/76  148/64  138/82  158/73  150/82   Blood Pressure (Exit)  134/64  150/80  122/82  128/60  130/80   Heart Rate (Admit)  80 bpm  75 bpm  80 bpm  70 bpm  66 bpm   Heart Rate (Exercise)  100 bpm  130 bpm  96 bpm  131 bpm  83 bpm   Heart Rate (Exit)  76 bpm  73 bpm  58 bpm  70 bpm  65 bpm   Oxygen Saturation (Admit)  98 %  -  -  -  -   Oxygen Saturation (Exercise)  94 %  -  -  -  -   Rating of Perceived Exertion (Exercise)  _0 Perceived Dyspnea (Exercise)  1  -  -  -  -   Symptoms  SOB, usteady shoes, wobbly gait  none  none  none  none   Comments  walk test results  PM changed to 70  -  -  -   Duration  -  Progress to 45 minutes of aerobic exercise without signs/symptoms of physical distress  Continue with 45 min of aerobic exercise without signs/symptoms of physical distress.  Continue with 45 min of aerobic exercise without signs/symptoms of physical distress.  Continue with 45 min of aerobic exercise without signs/symptoms of physical distress.   Intensity  -  THRR unchanged  Other (comment) use  rpe  Other (comment)  Other (comment) use RPE     Progression   Progression  -  Continue to progress workloads to maintain intensity without signs/symptoms of physical distress.  Continue to progress workloads to maintain intensity without signs/symptoms of physical distress.  Continue to progress workloads to maintain intensity without signs/symptoms of physical distress.  Continue to progress  workloads to maintain intensity without signs/symptoms of physical distress.   Average METs  -  1.76  _1 Resistance Training   Training Prescription  -  Yes  Yes  Yes  Yes   Weight  -  3 lb  3 lb  3 lb  3 lb   Reps  -  10-15  10-15  10-15  10-15     Treadmill   MPH  -  1.3  -  1.3  -   Grade  -  0.5  -  0.5  -   Minutes  -  15  -  15  -   METs  -  2.09  -  2.08  -     NuStep   Level  -  1  -  3  3   SPM  -  80  -  80  80   Minutes  -  15  -  15  15   METs  -  1.7  -  2.2  -     Recumbant Elliptical   Level  -  1  -  1  1   RPM  -  50  -  50  50   Minutes  -  15  -  15  15   METs  -  1.5  -  1.7  1.7      Exercise Comments: Exercise Comments    Row Name 09/24/18 1633 10/23/18 1639         Exercise Comments  irst full day of exercise!  Patient was oriented to gym and equipment including functions, settings, policies, and procedures.  Patient's individual exercise prescription and treatment plan were reviewed.  All starting workloads were established based on the results of the 6 minute walk test done at initial orientation visit.  The plan for exercise progression was also introduced and progression will be customized based on patient's performance and goals.  Reviewed home exercise with pt today.  Pt plans to walk for exercise.  Reviewed THR, pulse, RPE, sign and symptoms, NTG use, and when to call 911 or MD.  Also discussed weather considerations and indoor options.  Pt voiced understanding.         Exercise Goals and Review: Exercise Goals    Row Name 09/22/18 1452  Exercise Goals   Increase Physical Activity  Yes       Intervention  Provide advice, education, support and counseling about physical activity/exercise needs.;Develop an individualized exercise prescription for aerobic and resistive training based on initial evaluation findings, risk stratification, comorbidities and participant's personal goals.       Expected Outcomes  Short Term:  Attend rehab on a regular basis to increase amount of physical activity.;Long Term: Add in home exercise to make exercise part of routine and to increase amount of physical activity.;Long Term: Exercising regularly at least 3-5 days a week.       Increase Strength and Stamina  Yes       Intervention  Provide advice, education, support and counseling about physical activity/exercise needs.;Develop an individualized exercise prescription for aerobic and resistive training based on initial evaluation findings, risk stratification, comorbidities and participant's personal goals.       Expected Outcomes  Short Term: Increase workloads from initial exercise prescription for resistance, speed, and METs.;Short Term: Perform resistance training exercises routinely during rehab and add in resistance training at home;Long Term: Improve cardiorespiratory fitness, muscular endurance and strength as measured by increased METs and functional capacity (6MWT)       Able to understand and use rate of perceived exertion (RPE) scale  Yes       Intervention  Provide education and explanation on how to use RPE scale       Expected Outcomes  Short Term: Able to use RPE daily in rehab to express subjective intensity level;Long Term:  Able to use RPE to guide intensity level when exercising independently       Able to understand and use Dyspnea scale  Yes       Intervention  Provide education and explanation on how to use Dyspnea scale       Expected Outcomes  Short Term: Able to use Dyspnea scale daily in rehab to express subjective sense of shortness of breath during exertion;Long Term: Able to use Dyspnea scale to guide intensity level when exercising independently       Knowledge and understanding of Target Heart Rate Range (THRR)  Yes       Intervention  Provide education and explanation of THRR including how the numbers were predicted and where they are located for reference       Expected Outcomes  Short Term: Able to  state/look up THRR;Short Term: Able to use daily as guideline for intensity in rehab;Long Term: Able to use THRR to govern intensity when exercising independently       Able to check pulse independently  Yes       Intervention  Provide education and demonstration on how to check pulse in carotid and radial arteries.;Review the importance of being able to check your own pulse for safety during independent exercise       Expected Outcomes  Short Term: Able to explain why pulse checking is important during independent exercise;Long Term: Able to check pulse independently and accurately       Understanding of Exercise Prescription  Yes       Intervention  Provide education, explanation, and written materials on patient's individual exercise prescription       Expected Outcomes  Long Term: Able to explain home exercise prescription to exercise independently;Short Term: Able to explain program exercise prescription          Exercise Goals Re-Evaluation : Exercise Goals Re-Evaluation    Row Name 10/07/18 [phone] 10/22/18 1213 10/23/18 1639  11/05/18 1510 11/18/18 1159     Exercise Goal Re-Evaluation   Exercise Goals Review  Increase Physical Activity;Increase Strength and Stamina;Able to understand and use rate of perceived exertion (RPE) scale;Knowledge and understanding of Target Heart Rate Range (THRR);Understanding of Exercise Prescription  Increase Physical Activity;Increase Strength and Stamina;Able to understand and use rate of perceived exertion (RPE) scale;Knowledge and understanding of Target Heart Rate Range (THRR);Understanding of Exercise Prescription  Increase Physical Activity;Increase Strength and Stamina;Able to understand and use rate of perceived exertion (RPE) scale;Knowledge and understanding of Target Heart Rate Range (THRR);Able to check pulse independently;Understanding of Exercise Prescription  Increase Physical Activity;Increase Strength and Stamina;Able to understand and use rate of  perceived exertion (RPE) scale;Knowledge and understanding of Target Heart Rate Range (THRR)  Increase Physical Activity;Increase Strength and Stamina;Able to understand and use rate of perceived exertion (RPE) scale;Able to understand and use Dyspnea scale;Understanding of Exercise Prescription   Comments  Clive has tolerated exercise well.  Staff will monitor progress.   Aiken is able to do 45 min of cardiovascular exercise.  He has improved MET level slightly.  Staff will monitor progress.  Reviewed home exercise with pt today.  Pt plans to walk for exercise.  Reviewed THR, pulse, RPE, sign and symptoms, NTG use, and when to call 911 or MD.  Also discussed weather considerations and indoor options.  Pt voiced understanding.  Benedict attends consistently and is up to level 3 on NS.  he reports RPE of 15-17 on TM so has not increased speed or grade yet.  Staff will monitor progress.  Refujio attends consistently and works in correct RPE range. He has a pacemaker that limits HR.     Expected Outcomes  Short - increase levels on NS and XR Long - improve overall MET level  Short - continue to attend consistently Long - improve MET level  Short - add one day walking when weather permits Long - exercise independently  Short - become more comfortable on TM Long - increase overall MET level  Short - work on reducing RPE on current level Long - increase levels on all machines      Discharge Exercise Prescription (Final Exercise Prescription Changes): Exercise Prescription Changes - 11/18/18 1100      Response to Exercise   Blood Pressure (Admit)  112/56    Blood Pressure (Exercise)  150/82    Blood Pressure (Exit)  130/80    Heart Rate (Admit)  66 bpm    Heart Rate (Exercise)  83 bpm    Heart Rate (Exit)  65 bpm    Rating of Perceived Exertion (Exercise)  15    Symptoms  none    Duration  Continue with 45 min of aerobic exercise without signs/symptoms of physical distress.    Intensity  Other (comment)   use RPE      Progression   Progression  Continue to progress workloads to maintain intensity without signs/symptoms of physical distress.    Average METs  2      Resistance Training   Training Prescription  Yes    Weight  3 lb    Reps  10-15      NuStep   Level  3    SPM  80    Minutes  15      Recumbant Elliptical   Level  1    RPM  50    Minutes  15    METs  1.7       Nutrition:  Target Goals: Understanding of nutrition guidelines, daily intake of sodium <1590m, cholesterol <2024m calories 30% from fat and 7% or less from saturated fats, daily to have 5 or more servings of fruits and vegetables.  Biometrics: Pre Biometrics - 09/22/18 1453      Pre Biometrics   Height  5' 8.4" (1.737 m)    Weight  186 lb 14.4 oz (84.8 kg)    Waist Circumference  40 inches    Hip Circumference  40.5 inches    Waist to Hip Ratio  0.99 %    BMI (Calculated)  28.1    Single Leg Stand  15.44 seconds      Post Biometrics - 11/26/18 1731       Post  Biometrics   Height  5' 8.4" (1.737 m)    Weight  194 lb 8 oz (88.2 kg)    Waist Circumference  40 inches    Hip Circumference  40.5 inches    Waist to Hip Ratio  0.99 %    BMI (Calculated)  29.24       Nutrition Therapy Plan and Nutrition Goals: Nutrition Therapy & Goals - 09/25/18 1044      Nutrition Therapy   Diet  DM    Drug/Food Interactions  Coumadin/Vit K    Protein (specify units)  10oz    Fiber  30 grams    Whole Grain Foods  3 servings   eats white/wheat bread    Saturated Fats  13 max. grams    Fruits and Vegetables  5 servings/day   8 ideal; eats small portions but does try to eat fruits and vegetables daily   Sodium  2000 grams      Personal Nutrition Goals   Nutrition Goal  Drink one additional glass of water per day    Personal Goal #2  Use 1/2 of a cup as as guide when portioning out foods that contain Vitamin K so that you eat the same amounts each time    Personal Goal #3  Start to think about ways to decrease sodium  in your diet    Comments  His wife is responsible for cooking and providing most food for pt. His appetite is improving post surgery and wt is returning to UBW range after losing approx 8# per wife's estimation. (06/2018) HgbA1c 5.5. Breakfast: almond honey bunches of oats with 2% milk + dried cherries or other fruit, sometimes a biscuit. Lunch: "main meal" baked potato, beans, chili beans, ham or egg salad sandwich. Dinner: soup, sandwich, nuts, cheese, mini bagel with cream cheese, casserol occasionlly (prepared by daughter). Snacks: sweets, pretzels, chocolate. They do not eat many fried foods and he is trying to cut down on sweets.      Intervention Plan   Intervention  Prescribe, educate and counsel regarding individualized specific dietary modifications aiming towards targeted core components such as weight, hypertension, lipid management, diabetes, heart failure and other comorbidities.    Expected Outcomes  Short Term Goal: Understand basic principles of dietary content, such as calories, fat, sodium, cholesterol and nutrients.;Long Term Goal: Adherence to prescribed nutrition plan.;Short Term Goal: A plan has been developed with personal nutrition goals set during dietitian appointment.       Nutrition Assessments: Nutrition Assessments - 09/22/18 1341      MEDFICTS Scores   Pre Score  47       Nutrition Goals Re-Evaluation: Nutrition Goals Re-Evaluation    Row Name 09/25/18 1104 11/24/18 1629  Goals   Nutrition Goal  Use 1/2 of a cup as as guide when portioning out foods that contain Vitamin K so that you eat the same amounts each time  Bretton is taking a lower dose of coumadin than before.  He is aware that greens affect his INR.  Orris drinks a glass of water with meds in the morning.  He does drink soda that is sugar free and caffeine free.        Comment  He is on Coumadin and is having trouble regularing his INR  -      Expected Outcome  He will find an eating schedule  for and choose regulated portions of foods that are high in Vitamin K  Short - continue to drink water in the morning Long - maintain drinking water and hydration        Personal Goal #2 Re-Evaluation   Personal Goal #2  Drink one additional glass of water per day  -        Personal Goal #3 Re-Evaluation   Personal Goal #3  Start to think about ways to decrease sodium in your diet  -         Nutrition Goals Discharge (Final Nutrition Goals Re-Evaluation): Nutrition Goals Re-Evaluation - 11/24/18 1629      Goals   Nutrition Goal  Edgel is taking a lower dose of coumadin than before.  He is aware that greens affect his INR.  Kedar drinks a glass of water with meds in the morning.  He does drink soda that is sugar free and caffeine free.      Expected Outcome  Short - continue to drink water in the morning Long - maintain drinking water and hydration       Psychosocial: Target Goals: Acknowledge presence or absence of significant depression and/or stress, maximize coping skills, provide positive support system. Participant is able to verbalize types and ability to use techniques and skills needed for reducing stress and depression.   Initial Review & Psychosocial Screening: Initial Psych Review & Screening - 09/22/18 1338      Initial Review   Current issues with  Current Anxiety/Panic;Current Stress Concerns   current health issues   Source of Stress Concerns  Financial;Family;Unable to participate in former interests or hobbies;Unable to perform yard/household activities      [place]?  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      Screening Interventions   Interventions  Encouraged to exercise;Program counselor consult;To provide support and  resources with identified psychosocial needs;Provide feedback about the scores to participant    Expected Outcomes  Short Term goal: Utilizing psychosocial counselor, staff and physician to assist with identification of specific Stressors or current issues interfering with healing process. Setting desired goal for each stressor or current issue identified.;Long Term Goal: Stressors or current issues are controlled or eliminated.;Short Term goal: Identification and review with participant of any Quality of Life or Depression concerns found by scoring the questionnaire.;Long Term goal: The participant improves quality of Life and PHQ9 Scores as seen by post scores and/or verbalization of changes       Quality of Life Scores:  Quality of Life - 09/22/18 1340      Quality of Life   Select  Quality of Life      Quality of Life Scores   Health/Function Pre  23.14 %    Socioeconomic Pre  26.36 %    Psych/Spiritual Pre  28.29 %    Family Pre  30 %    GLOBAL Pre  28.95 %      Scores of 19 and below usually indicate a poorer quality of life in these areas.  A difference of  2-3 points is a clinically meaningful difference.  A difference of 2-3 points in the total score of the Quality of Life Index has been associated with significant improvement in overall quality of life, self-image, physical symptoms, and general health in studies assessing change in quality of life.  PHQ-9: Recent Review Flowsheet Data    Depression screen [facility] 2/9 09/22/2018 07/08/2018 11/11/2017 11/11/2017 10/30/2016   Decreased Interest 0 0 0 0 0   Down, Depressed, Hopeless 0 0 0 0 0   PHQ - 2 Score 0 0 0 0 0   Altered sleeping 0 - 0 - -   Tired, decreased energy 1 - 0 - -   Change in appetite 0 - 0 - -   Feeling bad or failure about yourself  0 - 0 - -   Trouble concentrating 0 - 0 - -   Moving slowly or fidgety/restless 0 - 0 - -   Suicidal thoughts 0 - 0 - -   PHQ-9 Score 1 - 0 - -   Difficult doing work/chores Not  difficult at all - Not difficult at all - -     Interpretation of Total Score  Total Score Depression Severity:  1-4 = Minimal depression, 5-9 = Mild depression, 10-14 = Moderate depression, 15-19 = Moderately severe depression, 20-27 = Severe depression   Psychosocial Evaluation and Intervention: Psychosocial Evaluation - 10/20/18 1702      Psychosocial Evaluation & Interventions   Interventions  Stress management education;Relaxation education;Encouraged to exercise with the program and follow exercise prescription    Comments  Counselor met with Mr. Depass 11/30/2022 - pronounced "Krista Blue") today for initial psychosocial evaluation.  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  He has a strong support system with a spouse of 79 years and (3) adult children between he and his spouse.  He also is actively involved in his local church  Nov 30, 2022 reports being in fairly good health; sleeps well and has a good appetite most of the time.  He denies a history of depression or anxiety other than situationally when he was going into surgery in August.  But reports at this time he is typically positive and rarely anxious.  He does have some stress in his life with his own health and some "problems dealing with the grandkid's problems!"  He has goals to get back into condition so he can more easily "continue walking and shopping at [facility]."  11/30/22 will be followed by staff while in this program.      Expected Outcomes  Short:  11-30-2022 will exercise for his health and for stress management. He will attend the psychoeducational component on stress and anxiety while in this program.    Long:  2022-11-30 will continue to make positive self-care choices by exercising and use of stress management strategies.[name]       Psychosocial Re-Evaluation: Psychosocial Re-Evaluation    Kittson Name 10/29/18 1720 11/24/18 7824  Psychosocial Re-Evaluation    Current issues with  None Identified  Current Stress Concerns      Comments  Counselor follow up with Dona today reporting he will be out of the office next week on vacation with his spouse at [facility].  He was encouraged to continue exercising consistently while out of the program.  He was struggling to work out today but was committed to be here and has a good positive attitude.  He will be followed upon his return.  Josua says his only concerns are financial and they are refinancing the house.  This is manageable for him.  He does the financial work by hand and when he leaves it, he doesnt worry about it.      Expected Outcomes  Short:  Verdon will continue to exercise while on vacation next week.  Long:  Iain will focus on his goals upon return and continue to exercise consistently.  Short - continue to use coping mechanisms to manage stress Long - maintain low stress levels         Psychosocial Discharge (Final Psychosocial Re-Evaluation): Psychosocial Re-Evaluation - 11/24/18 1632      Psychosocial Re-Evaluation   Current issues with  Current Stress Concerns    Comments  Taeden says his only concerns are financial and they are refinancing the house.  This is manageable for him.  He does the financial work by hand and when he leaves it, he doesnt worry about it.    Expected Outcomes  Short - continue to use coping mechanisms to manage stress Long - maintain low stress levels       Vocational Rehabilitation: Provide vocational rehab assistance to qualifying candidates.   Vocational Rehab Evaluation & Intervention: Vocational Rehab - 09/22/18 1307      Initial Vocational Rehab Evaluation & Intervention   Assessment shows need for Vocational Rehabilitation  No       Education: Education Goals: Education classes will be provided on a variety of topics geared toward better understanding of heart health and risk factor modification. Participant will state understanding/return demonstration  of topics presented as noted by education test scores.  Learning Barriers/Preferences: Learning Barriers/Preferences - 09/22/18 1306      Learning Barriers/Preferences   Learning Barriers  Hearing    Learning Preferences  None       Education Topics:  AED/CPR: - Group verbal and written instruction with the use of models to demonstrate the basic use of the AED with the basic ABC's of resuscitation.   Cardiac Rehab from 11/26/2018 in [facility] Cardiac and Pulmonary Rehab  Date  10/13/18  Educator  CE  Instruction Review Code  1- Verbalizes Understanding      General Nutrition Guidelines/Fats and Fiber: -Group instruction provided by verbal, written material, models and posters to present the general guidelines for heart healthy nutrition. Gives an explanation and review of dietary fats and fiber.   Cardiac Rehab from 11/26/2018 in [facility] Cardiac and Pulmonary Rehab  Date  10/27/18  Educator  LB  Instruction Review Code  1- Verbalizes Understanding      Controlling Sodium/Reading Food Labels: -Group verbal and written material supporting the discussion of sodium use in heart healthy nutrition. Review and explanation with models, verbal and written materials for utilization of the food label.   Cardiac Rehab from 11/26/2018 in [facility] Cardiac and Pulmonary Rehab  Date  10/29/18  Educator  LB  Instruction Review Code  1- Verbalizes Understanding      Exercise  Physiology & General Exercise Guidelines: - Group verbal and written instruction with models to review the exercise physiology of the cardiovascular system and associated critical values. Provides general exercise guidelines with specific guidelines to those with heart or lung disease.    Aerobic Exercise & Resistance Training: - Gives group verbal and written instruction on the various components of exercise. Focuses on aerobic and resistive training programs and the benefits of this training and how to safely progress through these  programs..   Flexibility, Balance, Mind/Body Relaxation: Provides group verbal/written instruction on the benefits of flexibility and balance training, including mind/body exercise modes such as yoga, pilates and tai chi.  Demonstration and skill practice provided.   Cardiac Rehab from 11/26/2018 in [facility] Cardiac and Pulmonary Rehab  Date  11/10/18  Educator  AS  Instruction Review Code  1- Verbalizes Understanding      Stress and Anxiety: - Provides group verbal and written instruction about the health risks of elevated stress and causes of high stress.  Discuss the correlation between heart/lung disease and anxiety and treatment options. Review healthy ways to manage with stress and anxiety.   Depression: - Provides group verbal and written instruction on the correlation between heart/lung disease and depressed mood, treatment options, and the stigmas associated with seeking treatment.   Cardiac Rehab from 11/26/2018 in [facility] Cardiac and Pulmonary Rehab  Date  10/22/18  Educator  Lucianne Lei, MSW  Instruction Review Code  2- Demonstrated Understanding      Anatomy & Physiology of the Heart: - Group verbal and written instruction and models provide basic cardiac anatomy and physiology, with the coronary electrical and arterial systems. Review of Valvular disease and Heart Failure   Cardiac Rehab from 11/26/2018 in [facility] Cardiac and Pulmonary Rehab  Date  11/17/18  Educator  CE  Instruction Review Code  1- Verbalizes Understanding      Cardiac Procedures: - Group verbal and written instruction to review commonly prescribed medications for heart disease. Reviews the medication, class of the drug, and side effects. Includes the steps to properly store meds and maintain the prescription regimen. (beta blockers and nitrates)   Cardiac Medications I: - Group verbal and written instruction to review commonly prescribed medications for heart disease. Reviews the medication, class of the  drug, and side effects. Includes the steps to properly store meds and maintain the prescription regimen.   Cardiac Rehab from 11/26/2018 in [facility] Cardiac and Pulmonary Rehab  Date  11/24/18  Educator  CE  Instruction Review Code  1- Verbalizes Understanding      Cardiac Medications II: -Group verbal and written instruction to review commonly prescribed medications for heart disease. Reviews the medication, class of the drug, and side effects. (all other drug classes)   Cardiac Rehab from 11/26/2018 in [facility] Cardiac and Pulmonary Rehab  Date  11/12/18  Educator  [facility]  Instruction Review Code  1- Verbalizes Understanding       Go Sex-Intimacy & Heart Disease, Get SMART - Goal Setting: - Group verbal and written instruction through game format to discuss heart disease and the return to sexual intimacy. Provides group verbal and written material to discuss and apply goal setting through the application of the S.M.A.R.T. Method.   Other Matters of the Heart: - Provides group verbal, written materials and models to describe Stable Angina and Peripheral Artery. Includes description of the disease process and treatment options available to the cardiac patient.   Exercise & Equipment Safety: - Individual verbal instruction and  demonstration of equipment use and safety with use of the equipment.   Cardiac Rehab from 11/26/2018 in [facility] Cardiac and Pulmonary Rehab  Date  09/22/18  Educator  [facility]  Instruction Review Code  1- Verbalizes Understanding      Infection Prevention: - Provides verbal and written material to individual with discussion of infection control including proper hand washing and proper equipment cleaning during exercise session.   Cardiac Rehab from 11/26/2018 in [facility] Cardiac and Pulmonary Rehab  Date  09/22/18  Educator  [facility]  Instruction Review Code  1- Verbalizes Understanding      Falls Prevention: - Provides verbal and written material to individual with discussion of  falls prevention and safety.   Cardiac Rehab from 11/26/2018 in [facility] Cardiac and Pulmonary Rehab  Date  09/22/18  Educator  [facility]  Instruction Review Code  1- Verbalizes Understanding      Diabetes: - Individual verbal and written instruction to review signs/symptoms of diabetes, desired ranges of glucose level fasting, after meals and with exercise. Acknowledge that pre and post exercise glucose checks will be done for 3 sessions at entry of program.   Cardiac Rehab from 11/26/2018 in [facility] Cardiac and Pulmonary Rehab  Date  09/22/18  Educator  [facility]  Instruction Review Code  1- Verbalizes Understanding      Know Your Numbers and Risk Factors: -Group verbal and written instruction about important numbers in your health.  Discussion of what are risk factors and how they play a role in the disease process.  Review of Cholesterol, Blood Pressure, Diabetes, and BMI and the role they play in your overall health.   Cardiac Rehab from 11/26/2018 in [facility] Cardiac and Pulmonary Rehab  Date  11/12/18  Educator  [facility]  Instruction Review Code  1- Verbalizes Understanding      Sleep Hygiene: -Provides group verbal and written instruction about how sleep can affect your health.  Define sleep hygiene, discuss sleep cycles and impact of sleep habits. Review good sleep hygiene tips.    Cardiac Rehab from 11/26/2018 in [facility] Cardiac and Pulmonary Rehab  Date  11/19/18  Educator  Lucianne Lei, MSW  Instruction Review Code  1- Verbalizes Understanding      Other: -Provides group and verbal instruction on various topics (see comments)   Knowledge Questionnaire Score: Knowledge Questionnaire Score - 09/22/18 1306      Knowledge Questionnaire Score   Pre Score  21/25   correct answers reviewed with Pavlos. Focus on anatomy, nutrition, exercise      Core Components/Risk Factors/Patient Goals at Admission: Personal Goals and Risk Factors at Admission - 09/22/18 1305      Core Components/Risk  Factors/Patient Goals on Admission    Weight Management  Yes;Weight Maintenance    Intervention  Weight Management: Develop a combined nutrition and exercise program designed to reach desired caloric intake, while maintaining appropriate intake of nutrient and fiber, sodium and fats, and appropriate energy expenditure required for the weight goal.;Weight Management: Provide education and appropriate resources to help participant work on and attain dietary goals.    Admit Weight  185 lb (83.9 kg)    Expected Outcomes  Short Term: Continue to assess and modify interventions until short term weight is achieved;Long Term: Adherence to nutrition and physical activity/exercise program aimed toward attainment of established weight goal;Weight Maintenance: Understanding of the daily nutrition guidelines, which includes 25-35% calories from fat, 7% or less cal from saturated fats, less than 220m cholesterol, less than 1.5gm of sodium, &  5 or more servings of fruits and vegetables daily;Understanding recommendations for meals to include 15-35% energy as protein, 25-35% energy from fat, 35-60% energy from carbohydrates, less than 263m of dietary cholesterol, 20-35 gm of total fiber daily;Understanding of distribution of calorie intake throughout the day with the consumption of 4-5 meals/snacks    Diabetes  Yes    Intervention  Provide education about signs/symptoms and action to take for hypo/hyperglycemia.;Provide education about proper nutrition, including hydration, and aerobic/resistive exercise prescription along with prescribed medications to achieve blood glucose in normal ranges: Fasting glucose 65-99 mg/dL    Expected Outcomes  Short Term: Participant verbalizes understanding of the signs/symptoms and immediate care of hyper/hypoglycemia, proper foot care and importance of medication, aerobic/resistive exercise and nutrition plan for blood glucose control.;Long Term: Attainment of HbA1C < 7%.    Lipids  Yes     Intervention  Provide education and support for participant on nutrition & aerobic/resistive exercise along with prescribed medications to achieve LDL <73m HDL >4025m   Expected Outcomes  Short Term: Participant states understanding of desired cholesterol values and is compliant with medications prescribed. Participant is following exercise prescription and nutrition guidelines.;Long Term: Cholesterol controlled with medications as prescribed, with individualized exercise RX and with personalized nutrition plan. Value goals: LDL < 72m4mDL > 40 mg.       Core Components/Risk Factors/Patient Goals Review:  Goals and Risk Factor Review    Row Name 10/23/18 1640 11/24/18 1623           Core Components/Risk Factors/Patient Goals Review   Personal Goals Review  Weight Management/Obesity;Improve shortness of breath with ADL's;Diabetes;Hypertension;Lipids  Weight Management/Obesity;Improve shortness of breath with ADL's;Diabetes;Hypertension;Lipids      Review  Kadyn is taking meds as directed.  He has a BP cuff at home but doesnt use it.  His BG at home has been 130-140 ( he admits sneaking chocolate to eat)  He has noticed he can carry full load of laundry and groceries easier than before starting HT.  [place] is taking all meds as directed.  He will start checking BP at [facility] at least [facility].  Demetres had A1c 3 months ago and his Dr was satisfied with it.  He still admits eating chocolate and sweets"more than I should."        Expected Outcomes  Short - check BP at home once a day Long - monitor BP, BG at home as well as follow Dr advice to achieve optimal level  Short - [facility] - He plans to join a gym wih his wife after graduation         Core Components/Risk Factors/Patient Goals at Discharge (Final Review):  Goals and Risk Factor Review - 11/24/18 1623      Core Components/Risk Factors/Patient Goals Review   Personal Goals  Review  Weight Management/Obesity;Improve shortness of breath with ADL's;Diabetes;Hypertension;Lipids    Review  Khaled is taking all meds as directed.  He will start checking BP at [facility] at least [facility] is still running around 140.  Lamin had A1c 3 months ago and his Dr was satisfied with it.  He still admits eating chocolate and sweets"more than I should."      Expected Outcomes  Short - [facility] - He plans to join a gym wih his wife after graduation  ITP Comments: ITP Comments    Row Name 09/22/18 1301 10/06/18 1649 10/14/18 0641 11/12/18 1400     ITP Comments  Med Review completed. Initial ITP created. Diagnosis can be found in [facility] 8/19  Omir reports his PM has changed to on demand at 70 bpm  30 Day Review. Continue with ITP unless directed changes per Medical Director review  30 day review completed. ITP sent to Dr. Mark Miller, Medical Director of Cardiac Rehab. Continue with ITP unless changes are made by physician.       Comments:  

## 2018-12-01 NOTE — Progress Notes (Unsigned)
Cardiac Individual Treatment Plan  Patient Details  Name: [name] [name] MRN: [id number] Date of Birth: 12/11/1933 Referring Provider:     Cardiac Rehab from 09/22/2018 in [facility] Cardiac and Pulmonary Rehab  Referring Provider  Kathlyn Sacramento MD      Initial Encounter Date:    Cardiac Rehab from 09/22/2018 in [facility] Cardiac and Pulmonary Rehab  Date  09/22/18      Visit Diagnosis: No diagnosis found.  Patient's Home Medications on Admission: No current facility-administered medications for this visit.  No current outpatient medications on file.  Facility-Administered Medications Ordered in Other Visits:  .  acetaminophen (TYLENOL) tablet 650 mg, 650 mg, Oral, Q6H PRN, Jodell Cipro, Prasanna, MD, 650 mg at 12/01/18 0200 .  aspirin EC tablet 81 mg, 81 mg, Oral, Daily, Jodell Cipro, Prasanna, MD, 81 mg at 12/01/18 1007 .  bisacodyl (DULCOLAX) EC tablet 5 mg, 5 mg, Oral, Daily PRN, Jodell Cipro, Prasanna, MD .  insulin aspart (novoLOG) injection 0-15 Units, 0-15 Units, Subcutaneous, TID WC, Sridharan, Prasanna, MD .  insulin aspart (novoLOG) injection 0-5 Units, 0-5 Units, Subcutaneous, QHS, Sridharan, Prasanna, MD .  latanoprost (XALATAN) 0.005 % ophthalmic solution 1 drop, 1 drop, Both Eyes, QHS, Sridharan, Prasanna, MD .  levothyroxine (SYNTHROID, LEVOTHROID) tablet 50 mcg, 50 mcg, Oral, Daily, Jodell Cipro, Prasanna, MD, 50 mcg at 12/01/18 1007 .  metoprolol tartrate (LOPRESSOR) tablet 12.5 mg, 12.5 mg, Oral, BID, Jodell Cipro, Prasanna, MD, 12.5 mg at 12/01/18 1007 .  morphine 2 MG/ML injection 2 mg, 2 mg, Intravenous, Q3H PRN **OR** morphine 4 MG/ML injection 4 mg, 4 mg, Intravenous, Q3H PRN, Jodell Cipro, Prasanna, MD .  pantoprazole (PROTONIX) EC tablet 40 mg, 40 mg, Oral, Daily, Jodell Cipro, Prasanna, MD, 40 mg at 12/01/18 1007 .  promethazine (PHENERGAN) injection 12.5 mg, 12.5 mg, Intravenous, Q6H PRN, Jodell Cipro, Prasanna, MD .  senna-docusate (Senokot-S) tablet 1 tablet, 1 tablet, Oral, QHS PRN,  Arta Silence, MD .  simvastatin (ZOCOR) tablet 20 mg, 20 mg, Oral, q1800, Jodell Cipro, Prasanna, MD .  warfarin (COUMADIN) tablet 4.5 mg, 4.5 mg, Oral, Once per day on Sun Mon Tue Wed Thu Sat **AND** [START ON 12/05/2018] warfarin (COUMADIN) tablet 6 mg, 6 mg, Oral, Q Fri-1800, Arta Silence, MD .  Warfarin - Pharmacist Dosing Inpatient, , Does not apply, [id number], Arta Silence, MD  Past Medical History: Past Medical History:  Diagnosis Date  . Atherosclerosis of aorta ([place])    by CT scan in past  . Atrial fibrillation ([place])    and aflutter. pt has a left atrial circuit that is not ablated. was on amiodarone-stopped, now use rate control.   . Bladder cancer ([place])    hx; treated with BCG in past   . Carotid artery disease ([place])    There was calcified plaque but no stenosis by carotid artery screening done at [facility] October, 2013  . CHB (complete heart block) ([place])    a. 05/2018 s/p BSX VVIR PPM ([facility]) in setting of bradycardia following myomectomy and AVR/MVR.  . Diabetes mellitus    type not specified  . Diabetic neuropathy (HCC)    feet  . Elevated liver enzymes    over time; hx  . Excessive sweating   . Glaucoma   . Gout   . Head injury    when slipped on ice 2004-2005. stabilized and back on Coumadin   . Headache    migraines - distant past  . History of cardiac cath    a. 05/2018 Cath ([facility]): Non-obstructive CAD. Mildly elevated  filling pressures w/ nl CI.  [name] HOCM (hypertrophic obstructive cardiomyopathy) ([place])    a. 01/2016 Echo: EF 65-70%, no rwma, LVOT gradient of 80-8mHg, mod AS, SAM; b. 11/2017 Echo: EF 55-60%, near cavity obliteration in systole, mod AS, mild MS; c. 04/2018 Card MRI ([facility]): Sev LVH, EF >70%, Sev AS, LVOT obs w/ Sev MR, mild to mod TR/PR, mid-myocardial HE in basal-mid anteroseptum and inferoseptum; d. 05/2018 s/p Septal myomectomy; e. 05/2018 Echo: Nl EF w/ sev LVH. Mech AoV and MV.  . Homocystinemia ([place]    elevated, mild    . Hypercholesterolemia    treated.   . Hypertension   . Kidney mass    laproscopic surgery woth cryoablation of a [facility]   . Moderate aortic stenosis    a. 01/2016 echo: mod AS.   .Marland KitchenMotion sickness    ocean boats  . Nausea   . Obstructive sleep apnea    CPAP started successfully 2014  . Orthostatic hypotension    a. orthostatic. dehydration. hospitalized 11/11  . Sleep apnea    Significant obstructive sleep apnea diagnosed in August, 2012, the patient is to receive CPAP   . Stroke ([facility]    "2 old strokes" CT and MRI. [facility] 11/11. diagnosis was dehydration, no acutal reports.   . TSH elevation    on synthroid historically   . Unsteady gait    August, 2012    Tobacco Use: Social History   Tobacco Use  Smoking Status Former Smoker  . Types: Cigars, Cigarettes  . Last attempt to quit: 10/02/1974  . Years since quitting: 44.1  Smokeless Tobacco Never Used  Tobacco Comment   still smokes cigars once a day    Labs: Recent Review Flowsheet Data    Labs for ITP Cardiac and Pulmonary Rehab Latest Ref Rng & Units 12/10/2016 03/30/2017 08/05/2017 11/12/2017 07/10/2018   Cholestrol 100 - 199 mg/dL 168 137 - 147 -   LDLCALC 0 - 99 mg/dL 96 70 - 82 -   HDL >39 mg/dL 41 36 - 41 -   Trlycerides 0 - 149 mg/dL 155(H) 153 - 120 -   Hemoglobin A1c 4.8 - 5.6 % 6.6(H) 7.4 6.5 6.3(H) 5.5       Exercise Target Goals: Exercise Program Goal: Individual exercise prescription set using results from initial 6 min walk test and THRR while considering  patient's activity barriers and safety.   Exercise Prescription Goal: Initial exercise prescription builds to 30-45 minutes a day of aerobic activity, 2-3 days per week.  Home exercise guidelines will be given to patient during program as part of exercise prescription that the participant will acknowledge.  Activity Barriers & Risk Stratification: Activity Barriers & Cardiac Risk Stratification -  09/22/18 1341      Activity Barriers & Cardiac Risk Stratification   Activity Barriers  Decreased Ventricular Function;Arthritis;Deconditioning;Muscular Weakness   left knee pain from dislocation in college   Cardiac Risk Stratification  High       6 Minute Walk: 6 Minute Walk    Row Name 09/22/18 1352 11/26/18 1727       6 Minute Walk   Phase  Initial  Discharge    Distance  1015 feet  1135 feet    Distance % Change  -  11.82 %    Distance Feet Change  -  120 ft    Walk Time  6 minutes  6 minutes    # of Rest Breaks  -  0    MPH  1.92  2.14    METS  2  1.8    RPE  13  15    Perceived Dyspnea   1  1    VO2 Peak  7.01  6.33    Symptoms  Yes (comment)  Yes (comment)    Comments  SOB, unsteady gait in shoes  Some shortness of breath    Resting HR  80 bpm  86 bpm    Resting BP  130/70  128/70    Resting Oxygen Saturation   98 %  -    Exercise Oxygen Saturation  during 6 min walk  94 %  -    Max Ex. HR  107 bpm  130 bpm    Max Ex. BP  156/76  142/60    2 Minute Post BP  134/64  -       Oxygen Initial Assessment:   Oxygen Re-Evaluation:   Oxygen Discharge (Final Oxygen Re-Evaluation):   Initial Exercise Prescription: Initial Exercise Prescription - 09/22/18 1400      Date of Initial Exercise RX and Referring Provider   Date  09/22/18    Referring Provider  Kathlyn Sacramento MD      Treadmill   MPH  1.3    Grade  0.5    Minutes  15    METs  2.09      NuStep   Level  1    SPM  80    Minutes  15    METs  2      Recumbant Elliptical   Level  1    RPM  50    Minutes  15    METs  2      Prescription Details   Frequency (times per week)  3    Duration  Progress to 45 minutes of aerobic exercise without signs/symptoms of physical distress      Intensity   THRR 40-80% of Max Heartrate  103-126    Ratings of Perceived Exertion  11-13    Perceived Dyspnea  0-4      Progression   Progression  Continue to progress workloads to maintain intensity without  signs/symptoms of physical distress.      Resistance Training   Training Prescription  Yes    Weight  3 lbs    Reps  10-15       Perform Capillary Blood Glucose checks as needed.  Exercise Prescription Changes: Exercise Prescription Changes    Row Name 09/22/18 1400 10/07/18 0900 10/22/18 1200 11/05/18 1500 11/18/18 1100     Response to Exercise   Blood Pressure (Admit)  130/70  130/80  124/70  138/64  112/56   Blood Pressure (Exercise)  156/76  148/64  138/82  158/73  150/82   Blood Pressure (Exit)  134/64  150/80  122/82  128/60  130/80   Heart Rate (Admit)  80 bpm  75 bpm  80 bpm  70 bpm  66 bpm   Heart Rate (Exercise)  100 bpm  130 bpm  96 bpm  131 bpm  83 bpm   Heart Rate (Exit)  76 bpm  73 bpm  58 bpm  70 bpm  65 bpm   Oxygen Saturation (Admit)  98 %  -  -  -  -   Oxygen Saturation (Exercise)  94 %  -  -  -  -   Rating of Perceived Exertion (Exercise)  13  15  14  17  15   Perceived Dyspnea (Exercise)  1  -  -  -  -   Symptoms  SOB, usteady shoes, wobbly gait  none  none  none  none   Comments  walk test results  PM changed to 70  -  -  -   Duration  -  Progress to 45 minutes of aerobic exercise without signs/symptoms of physical distress  Continue with 45 min of aerobic exercise without signs/symptoms of physical distress.  Continue with 45 min of aerobic exercise without signs/symptoms of physical distress.  Continue with 45 min of aerobic exercise without signs/symptoms of physical distress.   Intensity  -  THRR unchanged  Other (comment) use  rpe  Other (comment)  Other (comment) use RPE     Progression   Progression  -  Continue to progress workloads to maintain intensity without signs/symptoms of physical distress.  Continue to progress workloads to maintain intensity without signs/symptoms of physical distress.  Continue to progress workloads to maintain intensity without signs/symptoms of physical distress.  Continue to progress workloads to maintain intensity without  signs/symptoms of physical distress.   Average METs  -  1.76  _0 Resistance Training   Training Prescription  -  Yes  Yes  Yes  Yes   Weight  -  3 lb  3 lb  3 lb  3 lb   Reps  -  10-15  10-15  10-15  10-15     Treadmill   MPH  -  1.3  -  1.3  -   Grade  -  0.5  -  0.5  -   Minutes  -  15  -  15  -   METs  -  2.09  -  2.08  -     NuStep   Level  -  1  -  3  3   SPM  -  80  -  80  80   Minutes  -  15  -  15  15   METs  -  1.7  -  2.2  -     Recumbant Elliptical   Level  -  1  -  1  1   RPM  -  50  -  50  50   Minutes  -  15  -  15  15   METs  -  1.5  -  1.7  1.7      Exercise Comments: Exercise Comments    Row Name 09/24/18 1633 10/23/18 1639         Exercise Comments  irst full day of exercise!  Patient was oriented to gym and equipment including functions, settings, policies, and procedures.  Patient's individual exercise prescription and treatment plan were reviewed.  All starting workloads were established based on the results of the 6 minute walk test done at initial orientation visit.  The plan for exercise progression was also introduced and progression will be customized based on patient's performance and goals.  Reviewed home exercise with pt today.  Pt plans to walk for exercise.  Reviewed THR, pulse, RPE, sign and symptoms, NTG use, and when to call 911 or MD.  Also discussed weather considerations and indoor options.  Pt voiced understanding.         Exercise Goals and Review: Exercise Goals    Row Name 09/22/18 1452  Exercise Goals   Increase Physical Activity  Yes       Intervention  Provide advice, education, support and counseling about physical activity/exercise needs.;Develop an individualized exercise prescription for aerobic and resistive training based on initial evaluation findings, risk stratification, comorbidities and participant's personal goals.       Expected Outcomes  Short Term: Attend rehab on a regular basis to  increase amount of physical activity.;Long Term: Add in home exercise to make exercise part of routine and to increase amount of physical activity.;Long Term: Exercising regularly at least 3-5 days a week.       Increase Strength and Stamina  Yes       Intervention  Provide advice, education, support and counseling about physical activity/exercise needs.;Develop an individualized exercise prescription for aerobic and resistive training based on initial evaluation findings, risk stratification, comorbidities and participant's personal goals.       Expected Outcomes  Short Term: Increase workloads from initial exercise prescription for resistance, speed, and METs.;Short Term: Perform resistance training exercises routinely during rehab and add in resistance training at home;Long Term: Improve cardiorespiratory fitness, muscular endurance and strength as measured by increased METs and functional capacity (6MWT)       Able to understand and use rate of perceived exertion (RPE) scale  Yes       Intervention  Provide education and explanation on how to use RPE scale       Expected Outcomes  Short Term: Able to use RPE daily in rehab to express subjective intensity level;Long Term:  Able to use RPE to guide intensity level when exercising independently       Able to understand and use Dyspnea scale  Yes       Intervention  Provide education and explanation on how to use Dyspnea scale       Expected Outcomes  Short Term: Able to use Dyspnea scale daily in rehab to express subjective sense of shortness of breath during exertion;Long Term: Able to use Dyspnea scale to guide intensity level when exercising independently       Knowledge and understanding of Target Heart Rate Range (THRR)  Yes       Intervention  Provide education and explanation of THRR including how the numbers were predicted and where they are located for reference       Expected Outcomes  Short Term: Able to state/look up THRR;Short Term: Able to  use daily as guideline for intensity in rehab;Long Term: Able to use THRR to govern intensity when exercising independently       Able to check pulse independently  Yes       Intervention  Provide education and demonstration on how to check pulse in carotid and radial arteries.;Review the importance of being able to check your own pulse for safety during independent exercise       Expected Outcomes  Short Term: Able to explain why pulse checking is important during independent exercise;Long Term: Able to check pulse independently and accurately       Understanding of Exercise Prescription  Yes       Intervention  Provide education, explanation, and written materials on patient's individual exercise prescription       Expected Outcomes  Long Term: Able to explain home exercise prescription to exercise independently;Short Term: Able to explain program exercise prescription          Exercise Goals Re-Evaluation : Exercise Goals Re-Evaluation    Row Name 10/07/18 [phone] 10/22/18 1213 10/23/18 1639  11/05/18 1510 11/18/18 1159     Exercise Goal Re-Evaluation   Exercise Goals Review  Increase Physical Activity;Increase Strength and Stamina;Able to understand and use rate of perceived exertion (RPE) scale;Knowledge and understanding of Target Heart Rate Range (THRR);Understanding of Exercise Prescription  Increase Physical Activity;Increase Strength and Stamina;Able to understand and use rate of perceived exertion (RPE) scale;Knowledge and understanding of Target Heart Rate Range (THRR);Understanding of Exercise Prescription  Increase Physical Activity;Increase Strength and Stamina;Able to understand and use rate of perceived exertion (RPE) scale;Knowledge and understanding of Target Heart Rate Range (THRR);Able to check pulse independently;Understanding of Exercise Prescription  Increase Physical Activity;Increase Strength and Stamina;Able to understand and use rate of perceived exertion (RPE) scale;Knowledge  and understanding of Target Heart Rate Range (THRR)  Increase Physical Activity;Increase Strength and Stamina;Able to understand and use rate of perceived exertion (RPE) scale;Able to understand and use Dyspnea scale;Understanding of Exercise Prescription   Comments  [name] [name] has tolerated exercise well.  Staff will monitor progress.   [name] [name] is able to do 45 min of cardiovascular exercise.  He has improved MET level slightly.  Staff will monitor progress.  Reviewed home exercise with pt today.  Pt plans to walk for exercise.  Reviewed THR, pulse, RPE, sign and symptoms, NTG use, and when to call 911 or MD.  Also discussed weather considerations and indoor options.  Pt voiced understanding.  [name] [name] attends consistently and is up to level 3 on NS.  he reports RPE of 15-17 on TM so has not increased speed or grade yet.  Staff will monitor progress.  [name] [name] attends consistently and works in correct RPE range. He has a pacemaker that limits HR.     Expected Outcomes  Short - increase levels on NS and XR Long - improve overall MET level  Short - continue to attend consistently Long - improve MET level  Short - add one day walking when weather permits Long - exercise independently  Short - become more comfortable on TM Long - increase overall MET level  Short - work on reducing RPE on current level Long - increase levels on all machines      Discharge Exercise Prescription (Final Exercise Prescription Changes): Exercise Prescription Changes - 11/18/18 1100      Response to Exercise   Blood Pressure (Admit)  112/56    Blood Pressure (Exercise)  150/82    Blood Pressure (Exit)  130/80    Heart Rate (Admit)  66 bpm    Heart Rate (Exercise)  83 bpm    Heart Rate (Exit)  65 bpm    Rating of Perceived Exertion (Exercise)  15    Symptoms  none    Duration  Continue with 45 min of aerobic exercise without signs/symptoms of physical distress.    Intensity  Other (comment)   use RPE     Progression   Progression  Continue  to progress workloads to maintain intensity without signs/symptoms of physical distress.    Average METs  2      Resistance Training   Training Prescription  Yes    Weight  3 lb    Reps  10-15      NuStep   Level  3    SPM  80    Minutes  15      Recumbant Elliptical   Level  1    RPM  50    Minutes  15    METs  1.7       Nutrition:  Target Goals: Understanding of nutrition guidelines, daily intake of sodium <1556m, cholesterol <2057m calories 30% from fat and 7% or less from saturated fats, daily to have 5 or more servings of fruits and vegetables.  Biometrics: Pre Biometrics - 09/22/18 1453      Pre Biometrics   Height  5' 8.4" (1.737 m)    Weight  186 lb 14.4 oz (84.8 kg)    Waist Circumference  40 inches    Hip Circumference  40.5 inches    Waist to Hip Ratio  0.99 %    BMI (Calculated)  28.1    Single Leg Stand  15.44 seconds      Post Biometrics - 11/26/18 1731       Post  Biometrics   Height  5' 8.4" (1.737 m)    Weight  194 lb 8 oz (88.2 kg)    Waist Circumference  40 inches    Hip Circumference  40.5 inches    Waist to Hip Ratio  0.99 %    BMI (Calculated)  29.24       Nutrition Therapy Plan and Nutrition Goals: Nutrition Therapy & Goals - 09/25/18 1044      Nutrition Therapy   Diet  DM    Drug/Food Interactions  Coumadin/Vit K    Protein (specify units)  10oz    Fiber  30 grams    Whole Grain Foods  3 servings   eats white/wheat bread    Saturated Fats  13 max. grams    Fruits and Vegetables  5 servings/day   8 ideal; eats small portions but does try to eat fruits and vegetables daily   Sodium  2000 grams      Personal Nutrition Goals   Nutrition Goal  Drink one additional glass of water per day    Personal Goal #2  Use 1/2 of a cup as as guide when portioning out foods that contain Vitamin K so that you eat the same amounts each time    Personal Goal #3  Start to think about ways to decrease sodium in your diet    Comments  His wife is  responsible for cooking and providing most food for pt. His appetite is improving post surgery and wt is returning to UBW range after losing approx 8# per wife's estimation. (06/2018) HgbA1c 5.5. Breakfast: almond honey bunches of oats with 2% milk + dried cherries or other fruit, sometimes a biscuit. Lunch: "main meal" baked potato, beans, chili beans, ham or egg salad sandwich. Dinner: soup, sandwich, nuts, cheese, mini bagel with cream cheese, casserol occasionlly (prepared by daughter). Snacks: sweets, pretzels, chocolate. They do not eat many fried foods and he is trying to cut down on sweets.      Intervention Plan   Intervention  Prescribe, educate and counsel regarding individualized specific dietary modifications aiming towards targeted core components such as weight, hypertension, lipid management, diabetes, heart failure and other comorbidities.    Expected Outcomes  Short Term Goal: Understand basic principles of dietary content, such as calories, fat, sodium, cholesterol and nutrients.;Long Term Goal: Adherence to prescribed nutrition plan.;Short Term Goal: A plan has been developed with personal nutrition goals set during dietitian appointment.       Nutrition Assessments: Nutrition Assessments - 09/22/18 1341      MEDFICTS Scores   Pre Score  47       Nutrition Goals Re-Evaluation: Nutrition Goals Re-Evaluation    Row Name 09/25/18 1104 11/24/18 1629  Goals   Nutrition Goal  Use 1/2 of a cup as as guide when portioning out foods that contain Vitamin K so that you eat the same amounts each time  [name] [name] is taking a lower dose of coumadin than before.  He is aware that greens affect his INR.  [name] [name] drinks a glass of water with meds in the morning.  He does drink soda that is sugar free and caffeine free.        Comment  He is on Coumadin and is having trouble regularing his INR  -      Expected Outcome  He will find an eating schedule for and choose regulated portions of  foods that are high in Vitamin K  Short - continue to drink water in the morning Long - maintain drinking water and hydration        Personal Goal #2 Re-Evaluation   Personal Goal #2  Drink one additional glass of water per day  -        Personal Goal #3 Re-Evaluation   Personal Goal #3  Start to think about ways to decrease sodium in your diet  -         Nutrition Goals Discharge (Final Nutrition Goals Re-Evaluation): Nutrition Goals Re-Evaluation - 11/24/18 1629      Goals   Nutrition Goal  [name] [name] is taking a lower dose of coumadin than before.  He is aware that greens affect his INR.  [name] [name] drinks a glass of water with meds in the morning.  He does drink soda that is sugar free and caffeine free.      Expected Outcome  Short - continue to drink water in the morning Long - maintain drinking water and hydration       Psychosocial: Target Goals: Acknowledge presence or absence of significant depression and/or stress, maximize coping skills, provide positive support system. Participant is able to verbalize types and ability to use techniques and skills needed for reducing stress and depression.   Initial Review & Psychosocial Screening: Initial Psych Review & Screening - 09/22/18 1338      Initial Review   Current issues with  Current Anxiety/Panic;Current Stress Concerns   current health issues   Source of Stress Concerns  Financial;Family;Unable to participate in former interests or hobbies;Unable to perform yard/household activities      [place]?  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      Screening Interventions   Interventions  Encouraged to exercise;Program counselor consult;To provide support and resources with identified psychosocial  needs;Provide feedback about the scores to participant    Expected Outcomes  Short Term goal: Utilizing psychosocial counselor, staff and physician to assist with identification of specific Stressors or current issues interfering with healing process. Setting desired goal for each stressor or current issue identified.;Long Term Goal: Stressors or current issues are controlled or eliminated.;Short Term goal: Identification and review with participant of any Quality of Life or Depression concerns found by scoring the questionnaire.;Long Term goal: The participant improves quality of Life and PHQ9 Scores as seen by post scores and/or verbalization of changes       Quality of Life Scores:  Quality of Life - 09/22/18 1340      Quality of Life   Select  Quality of Life      Quality of Life Scores   Health/Function Pre  23.14 %    Socioeconomic Pre  26.36 %    Psych/Spiritual Pre  28.29 %    Family Pre  30 %    GLOBAL Pre  28.95 %      Scores of 19 and below usually indicate a poorer quality of life in these areas.  A difference of  2-3 points is a clinically meaningful difference.  A difference of 2-3 points in the total score of the Quality of Life Index has been associated with significant improvement in overall quality of life, self-image, physical symptoms, and general health in studies assessing change in quality of life.  PHQ-9: Recent Review Flowsheet Data    Depression screen [facility] 2/9 09/22/2018 07/08/2018 11/11/2017 11/11/2017 10/30/2016   Decreased Interest 0 0 0 0 0   Down, Depressed, Hopeless 0 0 0 0 0   PHQ - 2 Score 0 0 0 0 0   Altered sleeping 0 - 0 - -   Tired, decreased energy 1 - 0 - -   Change in appetite 0 - 0 - -   Feeling bad or failure about yourself  0 - 0 - -   Trouble concentrating 0 - 0 - -   Moving slowly or fidgety/restless 0 - 0 - -   Suicidal thoughts 0 - 0 - -   PHQ-9 Score 1 - 0 - -   Difficult doing work/chores Not difficult at all - Not difficult at all - -      Interpretation of Total Score  Total Score Depression Severity:  1-4 = Minimal depression, 5-9 = Mild depression, 10-14 = Moderate depression, 15-19 = Moderately severe depression, 20-27 = Severe depression   Psychosocial Evaluation and Intervention: Psychosocial Evaluation - 10/20/18 1702      Psychosocial Evaluation & Interventions   Interventions  Stress management education;Relaxation education;Encouraged to exercise with the program and follow exercise prescription    Comments  Counselor met with [name] [name] 2020-11[birth year] - pronounced "Krista Blue") today for initial psychosocial evaluation.  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  He has a strong support system with a spouse of 4 years and (3) adult children between he and his spouse.  He also is actively involved in his local church  11-21-22 reports being in fairly good health; sleeps well and has a good appetite most of the time.  He denies a history of depression or anxiety other than situationally when he was going into surgery in August.  But reports at this time he is typically positive and rarely anxious.  He does have some stress in his life with his own health and some "problems dealing with the grandkid's problems!"  He has goals to get back into condition so he can more easily "continue walking and shopping at [facility]."  2022/11/21 will be followed by staff while in this program.      Expected Outcomes  Short:  11-21-22 will exercise for his health and for stress management. He will attend the psychoeducational component on stress and anxiety while in this program.    Long:  November 21, 2022 will continue to make positive self-care choices by exercising and use of stress management strategies.[name]       Psychosocial Re-Evaluation: Psychosocial Re-Evaluation    [place] Name 10/29/18 1720 11/24/18 1275  Psychosocial Re-Evaluation   Current issues with  None Identified   Current Stress Concerns      Comments  Counselor follow up with [name] [name] today reporting he will be out of the office next week on vacation with his spouse at [facility].  He was encouraged to continue exercising consistently while out of the program.  He was struggling to work out today but was committed to be here and has a good positive attitude.  He will be followed upon his return.  [name] [name] says his only concerns are financial and they are refinancing the house.  This is manageable for him.  He does the financial work by hand and when he leaves it, he doesnt worry about it.      Expected Outcomes  Short:  [name] [name] will continue to exercise while on vacation next week.  Long:  [name] [name] will focus on his goals upon return and continue to exercise consistently.  Short - continue to use coping mechanisms to manage stress Long - maintain low stress levels         Psychosocial Discharge (Final Psychosocial Re-Evaluation): Psychosocial Re-Evaluation - 11/24/18 1632      Psychosocial Re-Evaluation   Current issues with  Current Stress Concerns    Comments  [name] [name] says his only concerns are financial and they are refinancing the house.  This is manageable for him.  He does the financial work by hand and when he leaves it, he doesnt worry about it.    Expected Outcomes  Short - continue to use coping mechanisms to manage stress Long - maintain low stress levels       Vocational Rehabilitation: Provide vocational rehab assistance to qualifying candidates.   Vocational Rehab Evaluation & Intervention: Vocational Rehab - 09/22/18 1307      Initial Vocational Rehab Evaluation & Intervention   Assessment shows need for Vocational Rehabilitation  No       Education: Education Goals: Education classes will be provided on a variety of topics geared toward better understanding of heart health and risk factor modification. Participant will state understanding/return demonstration of topics presented as noted by  education test scores.  Learning Barriers/Preferences: Learning Barriers/Preferences - 09/22/18 1306      Learning Barriers/Preferences   Learning Barriers  Hearing    Learning Preferences  None       Education Topics:  AED/CPR: - Group verbal and written instruction with the use of models to demonstrate the basic use of the AED with the basic ABC's of resuscitation.   Cardiac Rehab from 11/26/2018 in [facility] Cardiac and Pulmonary Rehab  Date  10/13/18  Educator  CE  Instruction Review Code  1- Verbalizes Understanding      General Nutrition Guidelines/Fats and Fiber: -Group instruction provided by verbal, written material, models and posters to present the general guidelines for heart healthy nutrition. Gives an explanation and review of dietary fats and fiber.   Cardiac Rehab from 11/26/2018 in [name] Cardiac and Pulmonary Rehab  Date  10/27/18  Educator  LB  Instruction Review Code  1- Verbalizes Understanding      Controlling Sodium/Reading Food Labels: -Group verbal and written material supporting the discussion of sodium use in heart healthy nutrition. Review and explanation with models, verbal and written materials for utilization of the food label.   Cardiac Rehab from 11/26/2018 in [facility] Cardiac and Pulmonary Rehab  Date  10/29/18  Educator  LB  Instruction Review Code  1- Verbalizes Understanding      Exercise  Physiology & General Exercise Guidelines: - Group verbal and written instruction with models to review the exercise physiology of the cardiovascular system and associated critical values. Provides general exercise guidelines with specific guidelines to those with heart or lung disease.    Aerobic Exercise & Resistance Training: - Gives group verbal and written instruction on the various components of exercise. Focuses on aerobic and resistive training programs and the benefits of this training and how to safely progress through these programs..   Flexibility,  Balance, Mind/Body Relaxation: Provides group verbal/written instruction on the benefits of flexibility and balance training, including mind/body exercise modes such as yoga, pilates and tai chi.  Demonstration and skill practice provided.   Cardiac Rehab from 11/26/2018 in [facility] Cardiac and Pulmonary Rehab  Date  11/10/18  Educator  AS  Instruction Review Code  1- Verbalizes Understanding      Stress and Anxiety: - Provides group verbal and written instruction about the health risks of elevated stress and causes of high stress.  Discuss the correlation between heart/lung disease and anxiety and treatment options. Review healthy ways to manage with stress and anxiety.   Depression: - Provides group verbal and written instruction on the correlation between heart/lung disease and depressed mood, treatment options, and the stigmas associated with seeking treatment.   Cardiac Rehab from 11/26/2018 in [facility] Cardiac and Pulmonary Rehab  Date  10/22/18  Educator  Lucianne Lei, MSW  Instruction Review Code  2- Demonstrated Understanding      Anatomy & Physiology of the Heart: - Group verbal and written instruction and models provide basic cardiac anatomy and physiology, with the coronary electrical and arterial systems. Review of Valvular disease and Heart Failure   Cardiac Rehab from 11/26/2018 in [facility] Cardiac and Pulmonary Rehab  Date  11/17/18  Educator  CE  Instruction Review Code  1- Verbalizes Understanding      Cardiac Procedures: - Group verbal and written instruction to review commonly prescribed medications for heart disease. Reviews the medication, class of the drug, and side effects. Includes the steps to properly store meds and maintain the prescription regimen. (beta blockers and nitrates)   Cardiac Medications I: - Group verbal and written instruction to review commonly prescribed medications for heart disease. Reviews the medication, class of the drug, and side effects.  Includes the steps to properly store meds and maintain the prescription regimen.   Cardiac Rehab from 11/26/2018 in [facility] Cardiac and Pulmonary Rehab  Date  11/24/18  Educator  CE  Instruction Review Code  1- Verbalizes Understanding      Cardiac Medications II: -Group verbal and written instruction to review commonly prescribed medications for heart disease. Reviews the medication, class of the drug, and side effects. (all other drug classes)   Cardiac Rehab from 11/26/2018 in [facility] Cardiac and Pulmonary Rehab  Date  11/12/18  Educator  [facility]  Instruction Review Code  1- Verbalizes Understanding       Go Sex-Intimacy & Heart Disease, Get SMART - Goal Setting: - Group verbal and written instruction through game format to discuss heart disease and the return to sexual intimacy. Provides group verbal and written material to discuss and apply goal setting through the application of the S.M.A.R.T. Method.   Other Matters of the Heart: - Provides group verbal, written materials and models to describe Stable Angina and Peripheral Artery. Includes description of the disease process and treatment options available to the cardiac patient.   Exercise & Equipment Safety: - Individual verbal instruction and  demonstration of equipment use and safety with use of the equipment.   Cardiac Rehab from 11/26/2018 in [facility] Cardiac and Pulmonary Rehab  Date  09/22/18  Educator  [facility]  Instruction Review Code  1- Verbalizes Understanding      Infection Prevention: - Provides verbal and written material to individual with discussion of infection control including proper hand washing and proper equipment cleaning during exercise session.   Cardiac Rehab from 11/26/2018 in [facility] Cardiac and Pulmonary Rehab  Date  09/22/18  Educator  [facility]  Instruction Review Code  1- Verbalizes Understanding      Falls Prevention: - Provides verbal and written material to individual with discussion of falls prevention and  safety.   Cardiac Rehab from 11/26/2018 in [facility] Cardiac and Pulmonary Rehab  Date  09/22/18  Educator  [facility]  Instruction Review Code  1- Verbalizes Understanding      Diabetes: - Individual verbal and written instruction to review signs/symptoms of diabetes, desired ranges of glucose level fasting, after meals and with exercise. Acknowledge that pre and post exercise glucose checks will be done for 3 sessions at entry of program.   Cardiac Rehab from 11/26/2018 in [facility] Cardiac and Pulmonary Rehab  Date  09/22/18  Educator  [facility]  Instruction Review Code  1- Verbalizes Understanding      Know Your Numbers and Risk Factors: -Group verbal and written instruction about important numbers in your health.  Discussion of what are risk factors and how they play a role in the disease process.  Review of Cholesterol, Blood Pressure, Diabetes, and BMI and the role they play in your overall health.   Cardiac Rehab from 11/26/2018 in [facility] Cardiac and Pulmonary Rehab  Date  11/12/18  Educator  [facility]  Instruction Review Code  1- Verbalizes Understanding      Sleep Hygiene: -Provides group verbal and written instruction about how sleep can affect your health.  Define sleep hygiene, discuss sleep cycles and impact of sleep habits. Review good sleep hygiene tips.    Cardiac Rehab from 11/26/2018 in [facility] Cardiac and Pulmonary Rehab  Date  11/19/18  Educator  Lucianne Lei, MSW  Instruction Review Code  1- Verbalizes Understanding      Other: -Provides group and verbal instruction on various topics (see comments)   Knowledge Questionnaire Score: Knowledge Questionnaire Score - 09/22/18 1306      Knowledge Questionnaire Score   Pre Score  21/25   correct answers reviewed with [name] [name]. Focus on anatomy, nutrition, exercise      Core Components/Risk Factors/Patient Goals at Admission: Personal Goals and Risk Factors at Admission - 09/22/18 1305      Core Components/Risk Factors/Patient Goals on Admission     Weight Management  Yes;Weight Maintenance    Intervention  Weight Management: Develop a combined nutrition and exercise program designed to reach desired caloric intake, while maintaining appropriate intake of nutrient and fiber, sodium and fats, and appropriate energy expenditure required for the weight goal.;Weight Management: Provide education and appropriate resources to help participant work on and attain dietary goals.    Admit Weight  185 lb (83.9 kg)    Expected Outcomes  Short Term: Continue to assess and modify interventions until short term weight is achieved;Long Term: Adherence to nutrition and physical activity/exercise program aimed toward attainment of established weight goal;Weight Maintenance: Understanding of the daily nutrition guidelines, which includes 25-35% calories from fat, 7% or less cal from saturated fats, less than 280m cholesterol, less than 1.5gm of sodium, &  5 or more servings of fruits and vegetables daily;Understanding recommendations for meals to include 15-35% energy as protein, 25-35% energy from fat, 35-60% energy from carbohydrates, less than 223m of dietary cholesterol, 20-35 gm of total fiber daily;Understanding of distribution of calorie intake throughout the day with the consumption of 4-5 meals/snacks    Diabetes  Yes    Intervention  Provide education about signs/symptoms and action to take for hypo/hyperglycemia.;Provide education about proper nutrition, including hydration, and aerobic/resistive exercise prescription along with prescribed medications to achieve blood glucose in normal ranges: Fasting glucose 65-99 mg/dL    Expected Outcomes  Short Term: Participant verbalizes understanding of the signs/symptoms and immediate care of hyper/hypoglycemia, proper foot care and importance of medication, aerobic/resistive exercise and nutrition plan for blood glucose control.;Long Term: Attainment of HbA1C < 7%.    Lipids  Yes    Intervention  Provide education  and support for participant on nutrition & aerobic/resistive exercise along with prescribed medications to achieve LDL <747m HDL >4056m   Expected Outcomes  Short Term: Participant states understanding of desired cholesterol values and is compliant with medications prescribed. Participant is following exercise prescription and nutrition guidelines.;Long Term: Cholesterol controlled with medications as prescribed, with individualized exercise RX and with personalized nutrition plan. Value goals: LDL < 64m87mDL > 40 mg.       Core Components/Risk Factors/Patient Goals Review:  Goals and Risk Factor Review    Row Name 10/23/18 1640 11/24/18 1623           Core Components/Risk Factors/Patient Goals Review   Personal Goals Review  Weight Management/Obesity;Improve shortness of breath with ADL's;Diabetes;Hypertension;Lipids  Weight Management/Obesity;Improve shortness of breath with ADL's;Diabetes;Hypertension;Lipids      Review  [name] [name] is taking meds as directed.  He has a BP cuff at home but doesnt use it.  His BG at home has been 130-140 ( he admits sneaking chocolate to eat)  He has noticed he can carry full load of laundry and groceries easier than before starting HT.  [name] [name] is taking all meds as directed.  He will start [facility].  [name] [name] had A1c 3 months ago and his Dr was satisfied with it.  He still admits eating chocolate and sweets"more than I should."        Expected Outcomes  Short - check BP at home once a day Long - monitor BP, BG at home as well as follow Dr advice to achieve optimal level  Short - [facility] - He plans to join a gym wih his wife after graduation         Core Components/Risk Factors/Patient Goals at Discharge (Final Review):  Goals and Risk Factor Review - 11/24/18 1623      Core Components/Risk Factors/Patient Goals Review   Personal Goals Review  Weight  Management/Obesity;Improve shortness of breath with ADL's;Diabetes;Hypertension;Lipids    Review  [name] [name] is taking all meds as directed.  He will start checking BP at [facility] at [facility].  [name] [name] had A1c 3 months ago and his Dr was satisfied with it.  He still admits eating chocolate and sweets"more than I should."      Expected Outcomes  Short [facility] - He plans to join a gym wih his wife after graduation  ITP Comments: ITP Comments    Row Name 09/22/18 1301 10/06/18 1649 10/14/18 0641 11/12/18 1400     ITP Comments  Med Review completed. Initial ITP created. Diagnosis can be found in [facility] 8/19  Mose reports his PM has changed to on demand at 70 bpm  30 Day Review. Continue with ITP unless directed changes per Medical Director review  30 day review completed. ITP sent to Dr. Emily Filbert, Medical Director of Cardiac Rehab. Continue with ITP unless changes are made by physician.       Comments: [name] [name]'s wife called to say that they found nodules in his lungs. Was on vacation in [facility] and felt dizzy and she called 911. Was transferred to [facility] via ambulance and is in Room 130.

## 2018-12-01 NOTE — H&P (Signed)
[facility] [place] NAME: [name] [name]    MR#:  [id number]  DATE OF BIRTH:  June 23, 1934  DATE OF ADMISSION:  11/30/2018  PRIMARY CARE PHYSICIAN: [name], MD   REQUESTING/REFERRING PHYSICIAN: N/A  CHIEF COMPLAINT:  No chief complaint on file.  HISTORY OF PRESENT ILLNESS:  [name] [name]  is a 85 y.o. male with a litany of chronic medical issues, including but not limited to T2NIDDM, HTN, HLD, Afib + mechanical AVR + mechanical MVR (Coumadin), CHB (s/p PPM), Hx bladder Ca/R renal mass p/w pulmonary nodules, now presenting for direct admission. AAOx3 but hard of hearing; pt hears better w/ his L ear. Pt was in [place] for vacation (to celebrate his birthday 02/[birth year]), drove down 02/13. Became lightheaded on 02/14, called EMS and was brought to local hospital ED. He states he received extensive workup, including chest CT, which demonstrated new pulmonary nodules. He is transferred to [facility]. Pt's Urologist was Dr. Bernardo Heater (followed pt for bladder/kidney Ca). Pt's wife says pt never had a medical oncologist. He endorses fatigue/malaise, generalized weakness and continues to have intermittent lightheadedness. He is otherwise w/o complaint at the time of my assessment, and appears reasonably well.  PAST MEDICAL HISTORY:   Past Medical History:  Diagnosis Date  . Atherosclerosis of aorta ([place])    by CT scan in past  . Atrial fibrillation ([place])    and aflutter. pt has a left atrial circuit that is not ablated. was on amiodarone-stopped, now use rate control.   . Bladder cancer ([place])    hx; treated with BCG in past   . Carotid artery disease ([place])    There was calcified plaque but no stenosis by carotid artery screening done at [facility] October, 2013  . CHB (complete heart block) ([place])    a. 05/2018 s/p BSX VVIR PPM ([facility]) in setting of bradycardia following myomectomy and AVR/MVR.  . Diabetes mellitus    type  not specified  . Diabetic neuropathy ([facility])    feet  . Elevated liver enzymes    over time; hx  . Excessive sweating   . Glaucoma   . Gout   . Head injury    when slipped on ice 2004-2005. stabilized and back on Coumadin   . Headache    migraines - distant past  . History of cardiac cath    a. 05/2018 Cath ([facility]): Non-obstructive CAD. Mildly elevated filling pressures w/ nl CI.  [name] HOCM (hypertrophic obstructive cardiomyopathy) ([place])    a. 01/2016 Echo: EF 65-70%, no rwma, LVOT gradient of 80-45mmHg, mod AS, SAM; b. 11/2017 Echo: EF 55-60%, near cavity obliteration in systole, mod AS, mild MS; c. 04/2018 Card MRI ([facility]): Sev LVH, EF >70%, Sev AS, LVOT obs w/ Sev MR, mild to mod TR/PR, mid-myocardial HE in basal-mid anteroseptum and inferoseptum; d. 05/2018 s/p Septal myomectomy; e. 05/2018 Echo: Nl EF w/ sev LVH. Mech AoV and MV.  . Homocystinemia ([place])    elevated, mild   . Hypercholesterolemia    treated.   . Hypertension   . Kidney mass    laproscopic surgery woth cryoablation of a mass outside kidney followed at [facility].   . Moderate aortic stenosis    a. 01/2016 echo: mod AS.   [name] Motion sickness    ocean boats  . Nausea   . Obstructive sleep apnea    CPAP started successfully 2014  . Orthostatic hypotension    a. orthostatic.  dehydration. hospitalized 11/11  . Sleep apnea    Significant obstructive sleep apnea diagnosed in August, 2012, the patient is to receive CPAP   . Stroke [facility])    "2 old strokes" CT and MRI. [facility] 11/11. diagnosis was dehydration, no acutal reports.   . TSH elevation    on synthroid historically   . Unsteady gait    August, 2012    PAST SURGICAL HISTORY:   Past Surgical History:  Procedure Laterality Date  . BLADDER SURGERY    . CATARACT EXTRACTION W/PHACO Left 05/11/2015   Procedure: CATARACT EXTRACTION PHACO AND INTRAOCULAR LENS PLACEMENT (IOC);  Surgeon: Leandrew Koyanagi, MD;  Location: [place];  Service: Ophthalmology;   Laterality: Left;  DIABETIC - oral meds, CPAP  . ESOPHAGOGASTRODUODENOSCOPY (EGD) WITH PROPOFOL N/A 07/01/2018   Procedure: ESOPHAGOGASTRODUODENOSCOPY (EGD) WITH PROPOFOL;  Surgeon: [name], MD;  Location[facility] END[facility]COPY;  Service: Endoscopy;  Laterality: N/A;  . INSERT / REPLACE / REMOVE PACEMAKER     Chemical engineer   . KIDNEY SURGERY     "froze mass"  . MECHANICAL AORTIC AND MITRAL VALVE REPLACEMENT  05/2018   [facility]   . TONSILLECTOMY    . VALVE REPLACEMENT      SOCIAL HISTORY:   Social History   Tobacco Use  . Smoking status: Former Smoker    Types: Cigars, Cigarettes    Last attempt to quit: 10/02/1974    Years since quitting: 44.1  . Smokeless tobacco: Never Used  . Tobacco comment: still smokes cigars once a day  Substance Use Topics  . Alcohol use: No    Frequency: Never    FAMILY HISTORY:   Family History  Problem Relation Age of Onset  . Arrhythmia Father        A-Fib  . Prostate cancer Father   . Stroke Father   . Breast cancer Mother   . Arrhythmia Brother        A-Fib  . Heart attack Neg Hx   . Hypertension Neg Hx     DRUG ALLERGIES:   Allergies  Allergen Reactions  . Macrolides And Ketolides Other (See Comments)  . Mycinettes   . Nitrofuran Derivatives Other (See Comments)  . Nitrofurantoin Other (See Comments)  . Erythromycin Itching and Rash    Other reaction(s): UNKNOWN  And red skin  . Zofran [Ondansetron Hcl] Rash    REVIEW OF SYSTEMS:   Review of Systems  Constitutional: Positive for malaise/fatigue. Negative for chills, diaphoresis, fever and weight loss.  HENT: Negative for congestion, ear pain, hearing loss, nosebleeds, sinus pain, sore throat and tinnitus.   Eyes: Negative for blurred vision, double vision and photophobia.  Respiratory: Negative for cough, hemoptysis, sputum production, shortness of breath and wheezing.   Cardiovascular: Negative for chest pain, palpitations, orthopnea, claudication, leg swelling and PND.    Gastrointestinal: Negative for abdominal pain, blood in stool, constipation, diarrhea, heartburn, melena, nausea and vomiting.  Genitourinary: Negative for dysuria, frequency, hematuria and urgency.  Musculoskeletal: Negative for back pain, joint pain, myalgias and neck pain.  Skin: Negative for itching and rash.  Neurological: Positive for dizziness and weakness. Negative for tingling, tremors, sensory change, speech change, focal weakness, seizures, loss of consciousness and headaches.  Psychiatric/Behavioral: Negative for depression and memory loss. The patient is not nervous/anxious and does not have insomnia.    MEDICATIONS AT HOME:   Prior to Admission medications   Medication Sig Start Date End Date Taking? Authorizing Provider  aspirin EC 81 MG  tablet Take 1 tablet (81 mg total) by mouth daily. 09/04/18  Yes Deboraha Sprang, MD  glucose blood (ONE TOUCH ULTRA TEST) test strip USE ONE STRIP TO CHECK GLUCOSE ONCE DAILY 03/25/17  Yes [name], MD  latanoprost (XALATAN) 0.005 % ophthalmic solution Place 1 drop into both eyes at bedtime.  11/13/13  Yes [provider]  levothyroxine (SYNTHROID, LEVOTHROID) 50 MCG tablet TAKE 1 TABLET BY MOUTH ONCE DAILY 08/10/18  Yes [name], MD  metFORMIN (GLUCOPHAGE) 1000 MG tablet Take 1 tablet (1,000 mg total) by mouth daily with breakfast. 08/19/18 08/19/19 Yes [name], MD  metoprolol tartrate (LOPRESSOR) 25 MG tablet Take 12.5 mg by mouth 2 (two) times daily.   Yes [provider]  ONE TOUCH ULTRA TEST test strip USE TO TEST BLOOD SUGAR ONCE DAILY 05/02/18  Yes [name], MD  [facility] DELICA LANCETS 99B MISC USE ONE LANCET TO CHECK BLOOD GLUCOSE ONCE DAILY 03/25/17  Yes [name], MD  [facility] DELICA LANCETS 71I MISC USE TO TEST BLOOD SUGAR ONCE DAILY 05/02/18  Yes [name], MD  pantoprazole (PROTONIX) 40 MG tablet Take 1 tablet (40 mg total) by mouth 2 (two)  times daily. Patient taking differently: Take 40 mg by mouth daily.  09/15/18  Yes [name], MD  simvastatin (ZOCOR) 40 MG tablet TAKE 1/2 (ONE-HALF) TABLET BY MOUTH AT BEDTIME 05/05/18  Yes Wellington Hampshire, MD  warfarin (COUMADIN) 3 MG tablet Take 1.5 to 2 tablets daily as directed by Coumadin clinic Patient taking differently: Take 1.5 to 2 tablets daily as directed by Coumadin clinic Monday- Thursday take 1.5 tablets  On Friday takes 2 tablets Sat and sun 1.5 tablets 11/21/18  Yes Wellington Hampshire, MD  acetaminophen (TYLENOL) 325 MG tablet Take 650 mg by mouth every 6 (six) hours as needed. 06/15/18   [provider]  amoxicillin (AMOXIL) 500 MG capsule Take 4 capsules (2000 mg) 30-60 minutes prior to dental cleaning Patient not taking: Reported on 12/01/2018 10/20/18   Rise Mu, PA-C      VITAL SIGNS:  Blood pressure (!) 131/59, pulse 73, temperature 98.1 F (36.7 C), temperature source Oral, resp. rate 18, height 5\' 9"  (1.753 m), weight 83.8 kg, SpO2 93 %.  PHYSICAL EXAMINATION:  Physical Exam Constitutional:      General: He is not in acute distress.    Appearance: He is not ill-appearing, toxic-appearing or diaphoretic.  HENT:     Head: Atraumatic.     Right Ear: Decreased hearing noted.     Left Ear: Decreased hearing noted.     Mouth/Throat:     Pharynx: Oropharynx is clear.  Eyes:     General: No scleral icterus.    Extraocular Movements: Extraocular movements intact.     Conjunctiva/sclera: Conjunctivae normal.  Neck:     Musculoskeletal: Neck supple.  Cardiovascular:     Rate and Rhythm: Normal rate and regular rhythm.     Heart sounds: Normal heart sounds. No murmur. No friction rub. No gallop.   Pulmonary:     Effort: No respiratory distress.     Breath sounds: No stridor. No wheezing, rhonchi or rales.  Abdominal:     General: Bowel sounds are normal. There is no distension.     Palpations: Abdomen is soft.     Tenderness: There is no  abdominal tenderness. There is no guarding or rebound.  Musculoskeletal: Normal  range of motion.        General: No swelling or tenderness.     Right lower leg: No edema.     Left lower leg: No edema.  Skin:    General: Skin is warm and dry.     Findings: No erythema or rash.  Neurological:     Mental Status: He is alert. Mental status is at baseline.  Psychiatric:        Mood and Affect: Mood normal.        Behavior: Behavior normal.        Thought Content: Thought content normal.        Judgment: Judgment normal.    LABORATORY PANEL:   CBC Recent Labs  Lab 12/01/18 0045  WBC 9.0  HGB 12.1*  HCT 37.0*  PLT 192   ------------------------------------------------------------------------------------------------------------------  Chemistries  Recent Labs  Lab 12/01/18 0045  NA 137  K 3.7  CL 102  CO2 26  GLUCOSE 122*  BUN 20  CREATININE 1.06  CALCIUM 9.0  MG 2.1  AST 25  ALT 13  ALKPHOS 58  BILITOT 1.0   ------------------------------------------------------------------------------------------------------------------  Cardiac Enzymes Recent Labs  Lab 12/01/18 0045  TROPONINI 0.03*   ------------------------------------------------------------------------------------------------------------------  RADIOLOGY:  No results found.  IMPRESSION AND PLAN:   A/P: 56M p/w pulmonary nodules. Direct admit. Hyperglycemia (w/ T2NIDDM), LDH elevation, normocytic anemia. Subtherapeutic INR. -Pulmonary nodules, LDH elevation: Concerning for malignancy. Hx bladder/renal Ca. May need Bx. However, pt is on Coumadin. Med Onc consult. -Lightheadedness: IVF. Cardiac monitoring. ASA, orthostatic VS, FSG qHS/AC, neuro checks q4h x24hr, fall precautions. -Hyperglycemia, T2NIDDM: SSI. Hold PO antihyperglycemics. -Normocytic anemia: Likely anemia of chronic disease. Stable, low suspicion for active/acute bleeding at present. -Subtherapeutic INR: Coumadin, INR 2.29. Mechanical  valves, goal INR 2.5-3.5. Resume and adjust as appropriate. -c/w other home meds/formulary subs as tolerated. -FEN/GI: Cardiac diabetic diet. -DVT PPx: Coumadin. -Code status: Full code. -Disposition: Admission, > 2 midnights.   All the records are reviewed and case discussed with ED provider. Management plans discussed with the patient, family and they are in agreement.  CODE STATUS: Full code.  TOTAL TIME TAKING CARE OF THIS PATIENT: 75 minutes.    Arta Silence M.D on 12/01/2018 at 3:32 AM  Between 7am to 6pm - Pager - [phone]  After 6pm go to [email or web address] - password [email or web address] [facility]  [facility] [place] [facility]  Office  [phone]  CC: Primary care physician; [name], MD   Note: This dictation was prepared with Dragon dictation along with smaller phrase technology. Any transcriptional errors that result from this process are unintentional.

## 2018-12-01 NOTE — Consult Note (Signed)
[place] CONSULT NOTE  Patient Care Team: [name], MD as PCP - General (Unknown Physician Specialty) Wellington Hampshire, MD as PCP - Cardiology (Cardiology) Margaretha Sheffield, MD (Otolaryngology) Leandrew Koyanagi, MD as Referring Physician (Ophthalmology) Wellington Hampshire, MD as Consulting Physician (Cardiology) Sharlotte Alamo, DPM as Consulting Physician (Podiatry) Bernardo Heater, Ronda Fairly, MD as Consulting Physician (Urology) Carmon Ginsberg, [place] as Referring Physician (Family Medicine) Wilhelmina Mcardle, MD as Consulting Physician (Pulmonary Disease) Jannet Mantis, MD as Consulting Physician (Dermatology)  CHIEF COMPLAINTS/PURPOSE OF CONSULTATION:  Mediastinal nodules  HISTORY OF PRESENTING ILLNESS:  [name] [name] 85 y.o.  male with multiple medical problems including mechanical valve/on Coumadin; CAD; and history of left kidney cancer status post cryo therapy is currently admitted from outside hospital for ongoing vertigo/dizziness.  Patient stated that he had been on a vacation in [place] when he had episode of significant vertigo/dizziness for which he was admitted to hospital in [facility].  Patient reportedly had had extensive work-up including imaging of the brain that was negative for any acute process.  He also had CT of the chest that showed no PE; up to 2.5 to 3 cm mediastinal adenopathy.  And also noted to have a right kidney mass up to 2 cm in size.  Patient was recommended work-up at the outside hospital with a biopsy.  However family/patient requested transfer to either [facility] or [facility]; but given lack of beds/closer home is admitted to [facility].  Overnight patient has been steady dizziness is improved.  No nausea no vomiting.  Denies any significant weight loss or shortness of breath or cough.  States his Coumadin levels have been fairly steady between 2.5-3.5 therapeutic INR.  Review of Systems  Constitutional:  Negative for chills, diaphoresis, fever, malaise/fatigue and weight loss.  HENT: Negative for nosebleeds and sore throat.   Eyes: Negative for double vision.  Respiratory: Negative for cough, hemoptysis, sputum production, shortness of breath and wheezing.   Cardiovascular: Negative for chest pain, palpitations, orthopnea and leg swelling.  Gastrointestinal: Negative for abdominal pain, blood in stool, constipation, diarrhea, heartburn, melena, nausea and vomiting.  Genitourinary: Negative for dysuria, frequency and urgency.  Musculoskeletal: Negative for back pain and joint pain.  Skin: Negative.  Negative for itching and rash.  Neurological: Positive for dizziness. Negative for tingling, focal weakness, weakness and headaches.  Endo/Heme/Allergies: Does not bruise/bleed easily.  Psychiatric/Behavioral: Negative for depression. The patient is not nervous/anxious and does not have insomnia.      MEDICAL HISTORY:  Past Medical History:  Diagnosis Date  . Atherosclerosis of aorta ([place])    by CT scan in past  . Atrial fibrillation ([place])    and aflutter. pt has a left atrial circuit that is not ablated. was on amiodarone-stopped, now use rate control.   . Bladder cancer ([place])    hx; treated with BCG in past   . Carotid artery disease ([place])    There was calcified plaque but no stenosis by carotid artery screening done at [facility] October, 2013  . CHB (complete heart block) ([place])    a. 05/2018 s/p BSX VVIR PPM ([facility]) in setting of bradycardia following myomectomy and AVR/MVR.  . Diabetes mellitus    type not specified  . Diabetic neuropathy ([facility])    feet  . Elevated liver enzymes    over time; hx  . Excessive sweating   . Glaucoma   . Gout   . Head injury  when slipped on ice 2004-2005. stabilized and back on Coumadin   . Headache    migraines - distant past  . History of cardiac cath    a. 05/2018 Cath ([facility]): Non-obstructive CAD. Mildly elevated filling  pressures w/ nl CI.  [name] HOCM (hypertrophic obstructive cardiomyopathy) ([place])    a. 01/2016 Echo: EF 65-70%, no rwma, LVOT gradient of 80-20mmHg, mod AS, SAM; b. 11/2017 Echo: EF 55-60%, near cavity obliteration in systole, mod AS, mild MS; c. 04/2018 Card MRI ([facility]): Sev LVH, EF >70%, Sev AS, LVOT obs w/ Sev MR, mild to mod TR/PR, mid-myocardial HE in basal-mid anteroseptum and inferoseptum; d. 05/2018 s/p Septal myomectomy; e. 05/2018 Echo: Nl EF w/ sev LVH. Mech AoV and MV.  . Homocystinemia ([place])    elevated, mild   . Hypercholesterolemia    treated.   . Hypertension   . Kidney mass    laproscopic surgery woth cryoablation of a mass outside kidney followed at [facility].   . Moderate aortic stenosis    a. 01/2016 echo: mod AS.   [name] Motion sickness    ocean boats  . Nausea   . Obstructive sleep apnea    CPAP started successfully 2014  . Orthostatic hypotension    a. orthostatic. dehydration. hospitalized 11/11  . Sleep apnea    Significant obstructive sleep apnea diagnosed in August, 2012, the patient is to receive CPAP   . Stroke [facility])    "2 old strokes" CT and MRI. [place] hospital 11/11. diagnosis was dehydration, no acutal reports.   . TSH elevation    on synthroid historically   . Unsteady gait    August, 2012    SURGICAL HISTORY: Past Surgical History:  Procedure Laterality Date  . BLADDER SURGERY    . CATARACT EXTRACTION W/PHACO Left 05/11/2015   Procedure: CATARACT EXTRACTION PHACO AND INTRAOCULAR LENS PLACEMENT (IOC);  Surgeon: Leandrew Koyanagi, MD;  Location: [facility];  Service: Ophthalmology;  Laterality: Left;  DIABETIC - oral meds, CPAP  . ESOPHAGOGASTRODUODENOSCOPY (EGD) WITH PROPOFOL N/A 07/01/2018   Procedure: ESOPHAGOGASTRODUODENOSCOPY (EGD) WITH PROPOFOL;  Surgeon: [name], MD;  Location: [facility] ENDOSCOPY;  Service: Endoscopy;  Laterality: N/A;  . INSERT / REPLACE / REMOVE PACEMAKER     Chemical engineer   . KIDNEY SURGERY     "froze mass"  . MECHANICAL  AORTIC AND MITRAL VALVE REPLACEMENT  05/2018   [facility]   . TONSILLECTOMY    . VALVE REPLACEMENT      SOCIAL HISTORY: Social History   Socioeconomic History  . Marital status: Married    Spouse name: Jasmine Awe  . Number of children: 1  . Years of education: 64  . Highest education level: Bachelor's degree (e.g., BA, AB, BS)  Occupational History  . Occupation: retired  Scientific laboratory technician  . Financial resource strain: Not hard at all  . Food insecurity:    Worry: Never true    Inability: Never true  . Transportation needs:    Medical: No    Non-medical: No  Tobacco Use  . Smoking status: Former Smoker    Types: Cigars, Cigarettes    Last attempt to quit: 10/02/1974    Years since quitting: 44.1  . Smokeless tobacco: Never Used  . Tobacco comment: still smokes cigars once a day  Substance and Sexual Activity  . Alcohol use: No    Frequency: Never  . Drug use: No  . Sexual activity: Yes  Lifestyle  . Physical activity:    Days per  week: 0 days    Minutes per session: 0 min  . Stress: Not at all  Relationships  . Social connections:    Talks on phone: More than three times a week    Gets together: More than three times a week    Attends religious service: More than 4 times per year    Active member of club or organization: Yes    Attends meetings of clubs or organizations: More than 4 times per year    Relationship status: Married  . Intimate partner violence:    Fear of current or ex partner: No    Emotionally abused: No    Physically abused: No    Forced sexual activity: No  Other Topics Concern  . Not on file  Social History Narrative   Married, retired, gets regular exercise.     FAMILY HISTORY: Family History  Problem Relation Age of Onset  . Arrhythmia Father        A-Fib  . Prostate cancer Father   . Stroke Father   . Breast cancer Mother   . Arrhythmia Brother        A-Fib  . Heart attack Neg Hx   . Hypertension Neg Hx     ALLERGIES:  is allergic  to macrolides and ketolides; mycinettes; nitrofuran derivatives; nitrofurantoin; erythromycin; and zofran [ondansetron hcl].  MEDICATIONS:  Current Facility-Administered Medications  Medication Dose Route Frequency Provider Last Rate Last Dose  . acetaminophen (TYLENOL) tablet 650 mg  650 mg Oral Q6H PRN Arta Silence, MD   650 mg at 12/01/18 0200  . aspirin EC tablet 81 mg  81 mg Oral Daily Arta Silence, MD   81 mg at 12/01/18 1007  . bisacodyl (DULCOLAX) EC tablet 5 mg  5 mg Oral Daily PRN Arta Silence, MD      . carbamide peroxide (DEBROX) 6.5 % OTIC (EAR) solution 5 drop  5 drop Right EAR BID Fritzi Mandes, MD      . diphenhydrAMINE (BENADRYL) capsule 25 mg  25 mg Oral TID PRN Fritzi Mandes, MD      . insulin aspart (novoLOG) injection 0-15 Units  0-15 Units Subcutaneous TID WC Sridharan, Prasanna, MD      . insulin aspart (novoLOG) injection 0-5 Units  0-5 Units Subcutaneous QHS Sridharan, Prasanna, MD      . latanoprost (XALATAN) 0.005 % ophthalmic solution 1 drop  1 drop Both Eyes QHS Sridharan, Prasanna, MD      . levothyroxine (SYNTHROID, LEVOTHROID) tablet 50 mcg  50 mcg Oral Daily Arta Silence, MD   50 mcg at 12/01/18 1007  . [START ON 12/02/2018] metFORMIN (GLUCOPHAGE) tablet 1,000 mg  1,000 mg Oral Q breakfast Fritzi Mandes, MD      . metoprolol tartrate (LOPRESSOR) tablet 12.5 mg  12.5 mg Oral BID Arta Silence, MD   12.5 mg at 12/01/18 1007  . pantoprazole (PROTONIX) EC tablet 40 mg  40 mg Oral Daily Arta Silence, MD   40 mg at 12/01/18 1007  . senna-docusate (Senokot-S) tablet 1 tablet  1 tablet Oral QHS PRN Arta Silence, MD      . simvastatin (ZOCOR) tablet 20 mg  20 mg Oral q1800 Arta Silence, MD   20 mg at 12/01/18 1812  . warfarin (COUMADIN) tablet 4.5 mg  4.5 mg Oral Once per day on Sun Mon Tue Wed Thu Sat Arta Silence, MD   4.5 mg at 12/01/18 1812   And  . [START ON 12/05/2018] warfarin (COUMADIN) tablet 6  mg  6 mg Oral Q  [id number] Arta Silence, MD      . Warfarin - Pharmacist Dosing Inpatient   Does not apply [id number] Arta Silence, MD          .  PHYSICAL EXAMINATION:  Vitals:   12/01/18 1722 12/01/18 2036  BP: (!) 144/62 133/61  Pulse: 70 70  Resp: 16 18  Temp: 98.1 F (36.7 C) 98.2 F (36.8 C)  SpO2: 96% 94%   Filed Weights   12/01/18 0100  Weight: 184 lb 11.9 oz (83.8 kg)    Physical Exam  Constitutional: He is oriented to person, place, and time and well-developed, well-nourished, and in no distress.  Patient is accompanied by his daughter and wife.  HENT:  Head: Normocephalic and atraumatic.  Mouth/Throat: Oropharynx is clear and moist. No oropharyngeal exudate.  Eyes: Pupils are equal, round, and reactive to light.  Neck: Normal range of motion. Neck supple.  Cardiovascular: Normal rate and regular rhythm.  Pulmonary/Chest: No respiratory distress. He has no wheezes.  Abdominal: Soft. Bowel sounds are normal. He exhibits no distension and no mass. There is no abdominal tenderness. There is no rebound and no guarding.  Musculoskeletal: Normal range of motion.        General: No tenderness or edema.  Neurological: He is alert and oriented to person, place, and time.  Skin: Skin is warm.  Psychiatric: Affect normal.     LABORATORY DATA:  I have reviewed the data as listed Lab Results  Component Value Date   WBC 9.0 12/01/2018   HGB 12.1 (L) 12/01/2018   HCT 37.0 (L) 12/01/2018   MCV 92.0 12/01/2018   PLT 192 12/01/2018   Recent Labs    06/30/18 1000  07/10/18 1229 07/29/18 1526 12/01/18 0045  NA 135   < > 139 137 137  K 3.8   < > 3.8 4.7 3.7  CL 101   < > 102 99 102  CO2 25   < > 21 20 26   GLUCOSE 152*   < > 120* 150* 122*  BUN 15   < > 8 13 20   CREATININE 0.88   < > 0.80 1.01 1.06  CALCIUM 8.8*   < > 9.2 9.5 9.0  GFRNONAA >60   < > 83 69 >60  GFRAA >60   < > 96 80 >60  PROT 7.8  --  7.2  --  8.0  ALBUMIN 3.3*  --  3.6  --  3.7  AST 48*  --  22  --   25  ALT 36  --  9  --  13  ALKPHOS 81  --  85  --  58  BILITOT 0.5  --  0.3  --  1.0   < > = values in this interval not displayed.    RADIOGRAPHIC STUDIES: I have personally reviewed the radiological images as listed and agreed with the findings in the report. Dg Chest Port 1 View  Result Date: 12/01/2018 CLINICAL DATA:  Shortness of breath with lightheadedness EXAM: PORTABLE CHEST 1 VIEW COMPARISON:  10/29/2017 FINDINGS: Interval pacer from the left with leads at the right ventricle. Interval mitral valve replacement. Low volume chest. There is no edema, consolidation, effusion, or pneumothorax. Normal heart size when accounting for low volumes. IMPRESSION: Low volume chest without acute finding. Electronically Signed   By: Monte Fantasia M.D.   On: 12/01/2018 07:13    Mediastinal lymphadenopathy 85 year old male patient with multiple medical problems including CAD/mechanical  valve on anticoagulation with Coumadin; history of kidney cancer; currently admitted to hospital for continued dizziness/incidentally noted to have mediastinal adenopathy/kidney mass  # Mediastinal adenopathy/subcarinal lymph node-approximately 2.5 to 3 cm/also-supraclavicular lymphadenopathy-the etiology is unclear.  Discussed with the patient and family that concerning for malignancy/however further work-up including a PET scan/biopsy would be recommended. [See discussion below]  # ~2 cm mass noted in the right kidney-again suspicious for malignancy.  Left kidney RCC status post cryo therapy  # Coronary artery disease/HOCM/artificial mechanical valve on Coumadin/Dr.Arida.  Discussed with the patient and family that patient will need to be bridged with Lovenox and proceeding with biopsy.  #Dizziness/vertigo-unclear etiology status post CT head/outside hospital-no acute process.  Discussed that patient's current problem is unrelated to the incidental finding noted on the CT scan.  Thank you Dr.Patel for allowing me  to participate in the care of your pleasant patient. Please do not hesitate to contact me with questions or concerns in the interim.  Discussed with Dr. Posey Pronto.  Addendum: Of note patient had a CT scan in December 2018-and was noted to have up to 6 mm left lower lobe lung nodule; also noted to have approximately 1.5 to 1.7 cm subcarinal/mediastinal adenopathy/right paratracheal lymph node We will plan to load the recent CT images in PACS.   All questions were answered. The patient knows to call the clinic with any problems, questions or concerns.    Cammie Sickle, MD 12/01/2018 8:37 PM

## 2018-12-01 NOTE — Care Management (Signed)
No discharge needs identified by the patient, wife or members of care team.

## 2018-12-01 NOTE — Progress Notes (Signed)
Patient ID: [name] [name], male   DOB: March 31, 1934, 85 y.o.   MRN: [id number] patient came in from out of town [place] where he was visiting had an episode of dizziness. She he went to the emergency room and extensive workup was done in the ER. CT scan of the chest was done which showed multiple pulmonary nodules and lymphadenopathy. Patient was then brought in by EMS after differential diagnosis were discussed with patient and family. Patient was seen by Dr. Rogue Bussing. Plan is to get pet scan as outpatient. Patient being on Coumadin will need to be held prior to considering tissue biopsy. This was discussed with patient's daughter, granddaughter and wife. This was also discussed with Dr. Rogue Bussing. Patient's dizziness has improved. He is sinus rhythm on telemetry will discontinue telemetry monitor. Continue home meds for now. Discussed with nurse to ambulate the patient. He is otherwise independent and continues to drive also.  Workup for pulmonary nodules will be done as outpatient. Family does understand that.

## 2018-12-01 NOTE — Progress Notes (Signed)
Cardiac Individual Treatment Plan  Patient Details  Name: [name] [name] MRN: [id number] Date of Birth: 11-Apr-1934 Referring Provider:     Cardiac Rehab from 09/22/2018 in [facility] Cardiac and Pulmonary Rehab  Referring Provider  Kathlyn Sacramento MD      Initial Encounter Date:    Cardiac Rehab from 09/22/2018 in [facility] Cardiac and Pulmonary Rehab  Date  09/22/18      Visit Diagnosis: No diagnosis found.  Patient's Home Medications on Admission: No current facility-administered medications for this visit.  No current outpatient medications on file.  Facility-Administered Medications Ordered in Other Visits:  .  acetaminophen (TYLENOL) tablet 650 mg, 650 mg, Oral, Q6H PRN, Jodell Cipro, Prasanna, MD, 650 mg at 12/01/18 0200 .  aspirin EC tablet 81 mg, 81 mg, Oral, Daily, Jodell Cipro, Prasanna, MD, 81 mg at 12/01/18 1007 .  bisacodyl (DULCOLAX) EC tablet 5 mg, 5 mg, Oral, Daily PRN, Arta Silence, MD .  diphenhydrAMINE (BENADRYL) capsule 25 mg, 25 mg, Oral, TID PRN, Fritzi Mandes, MD .  insulin aspart (novoLOG) injection 0-15 Units, 0-15 Units, Subcutaneous, TID WC, Sridharan, Prasanna, MD .  insulin aspart (novoLOG) injection 0-5 Units, 0-5 Units, Subcutaneous, QHS, Sridharan, Prasanna, MD .  latanoprost (XALATAN) 0.005 % ophthalmic solution 1 drop, 1 drop, Both Eyes, QHS, Sridharan, Prasanna, MD .  levothyroxine (SYNTHROID, LEVOTHROID) tablet 50 mcg, 50 mcg, Oral, Daily, Jodell Cipro, Prasanna, MD, 50 mcg at 12/01/18 1007 .  [START ON 12/02/2018] metFORMIN (GLUCOPHAGE) tablet 1,000 mg, 1,000 mg, Oral, Q breakfast, Fritzi Mandes, MD .  metoprolol tartrate (LOPRESSOR) tablet 12.5 mg, 12.5 mg, Oral, BID, Jodell Cipro, Prasanna, MD, 12.5 mg at 12/01/18 1007 .  pantoprazole (PROTONIX) EC tablet 40 mg, 40 mg, Oral, Daily, Jodell Cipro, Prasanna, MD, 40 mg at 12/01/18 1007 .  promethazine (PHENERGAN) injection 12.5 mg, 12.5 mg, Intravenous, Q6H PRN, Jodell Cipro, Prasanna, MD .  senna-docusate (Senokot-S)  tablet 1 tablet, 1 tablet, Oral, QHS PRN, Arta Silence, MD .  simvastatin (ZOCOR) tablet 20 mg, 20 mg, Oral, q1800, Jodell Cipro, Prasanna, MD .  warfarin (COUMADIN) tablet 4.5 mg, 4.5 mg, Oral, Once per day on Sun Mon Tue Wed Thu Sat **AND** [START ON 12/05/2018] warfarin (COUMADIN) tablet 6 mg, 6 mg, Oral, Q Fri-1800, Arta Silence, MD .  Warfarin - Pharmacist Dosing Inpatient, , Does not apply, [id number], Arta Silence, MD  Past Medical History: Past Medical History:  Diagnosis Date  . Atherosclerosis of aorta ([place])    by CT scan in past  . Atrial fibrillation ([place])    and aflutter. pt has a left atrial circuit that is not ablated. was on amiodarone-stopped, now use rate control.   . Bladder cancer ([place])    hx; treated with BCG in past   . Carotid artery disease ([place])    There was calcified plaque but no stenosis by carotid artery screening done at [facility] October, 2013  . CHB (complete heart block) ([place])    a. 05/2018 s/p BSX VVIR PPM ([facility]) in setting of bradycardia following myomectomy and AVR/MVR.  . Diabetes mellitus    type not specified  . Diabetic neuropathy (H[facility])    feet  . Elevated liver enzymes    over time; hx  . Excessive sweating   . Glaucoma   . Gout   . Head injury    when slipped on ice 2004-2005. stabilized and back on Coumadin   . Headache    migraines - distant past  . History of cardiac cath    a. 05/2018 Cath (  [facility]): Non-obstructive CAD. Mildly elevated filling pressures w/ nl CI.  [name] HOCM (hypertrophic obstructive cardiomyopathy) ([place])    a. 01/2016 Echo: EF 65-70%, no rwma, LVOT gradient of 80-43mHg, mod AS, SAM; b. 11/2017 Echo: EF 55-60%, near cavity obliteration in systole, mod AS, mild MS; c. 04/2018 Card MRI ([facility]): Sev LVH, EF >70%, Sev AS, LVOT obs w/ Sev MR, mild to mod TR/PR, mid-myocardial HE in basal-mid anteroseptum and inferoseptum; d. 05/2018 s/p Septal myomectomy; e. 05/2018 Echo: Nl EF w/ sev LVH. Mech AoV and MV.  .  [place]    elevated, mild   . Hypercholesterolemia    treated.   . Hypertension   . Kidney mass    laproscopic surgery woth cry[facility]ablation of [facility]   . Moderate aortic stenosis    a. 01/2016 echo: mod AS.   .Marland KitchenMotion sickness    ocean boats  . Nausea   . Obstructive sleep apnea    CPAP started successfully 2014  . Orthostatic hypotension    a. orthostatic. dehydration. hospitalized 11/11  . Sleep apnea    Significant obstructive sleep apnea diagnosed in August, 2012, the patient is to receive CPAP   . Stroke ([facility]    "2 old strokes" CT and MRI. [facility] 11/11. diagnosis was dehydration, no acutal reports.   . TSH elevation    on synthroid historically   . Unsteady gait    August, 2012    Tobacco Use: Social History   Tobacco Use  Smoking Status Former Smoker  . Types: Cigars, Cigarettes  . Last attempt to quit: 10/02/1974  . Years since quitting: 44.1  Smokeless Tobacco Never Used  Tobacco Comment   still smokes cigars once a day    Labs: Recent Review Flowsheet Data    Labs for ITP Cardiac and Pulmonary Rehab Latest Ref Rng & Units 12/10/2016 03/30/2017 08/05/2017 11/12/2017 07/10/2018   Cholestrol 100 - 199 mg/dL 168 137 - 147 -   LDLCALC 0 - 99 mg/dL 96 70 - 82 -   HDL >39 mg/dL 41 36 - 41 -   Trlycerides 0 - 149 mg/dL 155(H) 153 - 120 -   Hemoglobin A1c 4.8 - 5.6 % 6.6(H) 7.4 6.5 6.3(H) 5.5       Exercise Target Goals: Exercise Program Goal: Individual exercise prescription set using results from initial 6 min walk test and THRR while considering  patient's activity barriers and safety.   Exercise Prescription Goal: Initial exercise prescription builds to 30-45 minutes a day of aerobic activity, 2-3 days per week.  Home exercise guidelines will be given to patient during program as part of exercise prescription that the participant will acknowledge.  Activity Barriers & Risk Stratification: Activity  Barriers & Cardiac Risk Stratification - 09/22/18 1341      Activity Barriers & Cardiac Risk Stratification   Activity Barriers  Decreased Ventricular Function;Arthritis;Deconditioning;Muscular Weakness   left knee pain from dislocation in college   Cardiac Risk Stratification  High       6 Minute Walk: 6 Minute Walk    Row Name 09/22/18 1352 11/26/18 1727       6 Minute Walk   Phase  Initial  Discharge    Distance  1015 feet  1135 feet    Distance % Change  -  11.82 %    Distance Feet Change  -  120 ft    Walk Time  6 minutes  6 minutes    #  of Rest Breaks  -  0    MPH  1.92  2.14    METS  2  1.8    RPE  13  15    Perceived Dyspnea   1  1    VO2 Peak  7.01  6.33    Symptoms  Yes (comment)  Yes (comment)    Comments  SOB, unsteady gait in shoes  Some shortness of breath    Resting HR  80 bpm  86 bpm    Resting BP  130/70  128/70    Resting Oxygen Saturation   98 %  -    Exercise Oxygen Saturation  during 6 min walk  94 %  -    Max Ex. HR  107 bpm  130 bpm    Max Ex. BP  156/76  142/60    2 Minute Post BP  134/64  -       Oxygen Initial Assessment:   Oxygen Re-Evaluation:   Oxygen Discharge (Final Oxygen Re-Evaluation):   Initial Exercise Prescription: Initial Exercise Prescription - 09/22/18 1400      Date of Initial Exercise RX and Referring Provider   Date  09/22/18    Referring Provider  Kathlyn Sacramento MD      Treadmill   MPH  1.3    Grade  0.5    Minutes  15    METs  2.09      NuStep   Level  1    SPM  80    Minutes  15    METs  2      Recumbant Elliptical   Level  1    RPM  50    Minutes  15    METs  2      Prescription Details   Frequency (times per week)  3    Duration  Progress to 45 minutes of aerobic exercise without signs/symptoms of physical distress      Intensity   THRR 40-80% of Max Heartrate  103-126    Ratings of Perceived Exertion  11-13    Perceived Dyspnea  0-4      Progression   Progression  Continue to progress  workloads to maintain intensity without signs/symptoms of physical distress.      Resistance Training   Training Prescription  Yes    Weight  3 lbs    Reps  10-15       Perform Capillary Blood Glucose checks as needed.  Exercise Prescription Changes: Exercise Prescription Changes    Row Name 09/22/18 1400 10/07/18 0900 10/22/18 1200 11/05/18 1500 11/18/18 1100     Response to Exercise   Blood Pressure (Admit)  130/70  130/80  124/70  138/64  112/56   Blood Pressure (Exercise)  156/76  148/64  138/82  158/73  150/82   Blood Pressure (Exit)  134/64  150/80  122/82  128/60  130/80   Heart Rate (Admit)  80 bpm  75 bpm  80 bpm  70 bpm  66 bpm   Heart Rate (Exercise)  100 bpm  130 bpm  96 bpm  131 bpm  83 bpm   Heart Rate (Exit)  76 bpm  73 bpm  58 bpm  70 bpm  65 bpm   Oxygen Saturation (Admit)  98 %  -  -  -  -   Oxygen Saturation (Exercise)  94 %  -  -  -  -   Rating of Perceived Exertion (Exercise)  _0 Perceived Dyspnea (Exercise)  1  -  -  -  -   Symptoms  SOB, usteady shoes, wobbly gait  none  none  none  none   Comments  walk test results  PM changed to 70  -  -  -   Duration  -  Progress to 45 minutes of aerobic exercise without signs/symptoms of physical distress  Continue with 45 min of aerobic exercise without signs/symptoms of physical distress.  Continue with 45 min of aerobic exercise without signs/symptoms of physical distress.  Continue with 45 min of aerobic exercise without signs/symptoms of physical distress.   Intensity  -  THRR unchanged  Other (comment) use  rpe  Other (comment)  Other (comment) use RPE     Progression   Progression  -  Continue to progress workloads to maintain intensity without signs/symptoms of physical distress.  Continue to progress workloads to maintain intensity without signs/symptoms of physical distress.  Continue to progress workloads to maintain intensity without signs/symptoms of physical distress.  Continue to progress  workloads to maintain intensity without signs/symptoms of physical distress.   Average METs  -  1.76  _1 Resistance Training   Training Prescription  -  Yes  Yes  Yes  Yes   Weight  -  3 lb  3 lb  3 lb  3 lb   Reps  -  10-15  10-15  10-15  10-15     Treadmill   MPH  -  1.3  -  1.3  -   Grade  -  0.5  -  0.5  -   Minutes  -  15  -  15  -   METs  -  2.09  -  2.08  -     NuStep   Level  -  1  -  3  3   SPM  -  80  -  80  80   Minutes  -  15  -  15  15   METs  -  1.7  -  2.2  -     Recumbant Elliptical   Level  -  1  -  1  1   RPM  -  50  -  50  50   Minutes  -  15  -  15  15   METs  -  1.5  -  1.7  1.7      Exercise Comments: Exercise Comments    Row Name 09/24/18 1633 10/23/18 1639         Exercise Comments  irst full day of exercise!  Patient was oriented to gym and equipment including functions, settings, policies, and procedures.  Patient's individual exercise prescription and treatment plan were reviewed.  All starting workloads were established based on the results of the 6 minute walk test done at initial orientation visit.  The plan for exercise progression was also introduced and progression will be customized based on patient's performance and goals.  Reviewed home exercise with pt today.  Pt plans to walk for exercise.  Reviewed THR, pulse, RPE, sign and symptoms, NTG use, and when to call 911 or MD.  Also discussed weather considerations and indoor options.  Pt voiced understanding.         Exercise Goals and Review: Exercise Goals    Row Name 09/22/18 1452  Exercise Goals   Increase Physical Activity  Yes       Intervention  Provide advice, education, support and counseling about physical activity/exercise needs.;Develop an individualized exercise prescription for aerobic and resistive training based on initial evaluation findings, risk stratification, comorbidities and participant's personal goals.       Expected Outcomes  Short Term:  Attend rehab on a regular basis to increase amount of physical activity.;Long Term: Add in home exercise to make exercise part of routine and to increase amount of physical activity.;Long Term: Exercising regularly at least 3-5 days a week.       Increase Strength and Stamina  Yes       Intervention  Provide advice, education, support and counseling about physical activity/exercise needs.;Develop an individualized exercise prescription for aerobic and resistive training based on initial evaluation findings, risk stratification, comorbidities and participant's personal goals.       Expected Outcomes  Short Term: Increase workloads from initial exercise prescription for resistance, speed, and METs.;Short Term: Perform resistance training exercises routinely during rehab and add in resistance training at home;Long Term: Improve cardiorespiratory fitness, muscular endurance and strength as measured by increased METs and functional capacity (6MWT)       Able to understand and use rate of perceived exertion (RPE) scale  Yes       Intervention  Provide education and explanation on how to use RPE scale       Expected Outcomes  Short Term: Able to use RPE daily in rehab to express subjective intensity level;Long Term:  Able to use RPE to guide intensity level when exercising independently       Able to understand and use Dyspnea scale  Yes       Intervention  Provide education and explanation on how to use Dyspnea scale       Expected Outcomes  Short Term: Able to use Dyspnea scale daily in rehab to express subjective sense of shortness of breath during exertion;Long Term: Able to use Dyspnea scale to guide intensity level when exercising independently       Knowledge and understanding of Target Heart Rate Range (THRR)  Yes       Intervention  Provide education and explanation of THRR including how the numbers were predicted and where they are located for reference       Expected Outcomes  Short Term: Able to  state/look up THRR;Short Term: Able to use daily as guideline for intensity in rehab;Long Term: Able to use THRR to govern intensity when exercising independently       Able to check pulse independently  Yes       Intervention  Provide education and demonstration on how to check pulse in carotid and radial arteries.;Review the importance of being able to check your own pulse for safety during independent exercise       Expected Outcomes  Short Term: Able to explain why pulse checking is important during independent exercise;Long Term: Able to check pulse independently and accurately       Understanding of Exercise Prescription  Yes       Intervention  Provide education, explanation, and written materials on patient's individual exercise prescription       Expected Outcomes  Long Term: Able to explain home exercise prescription to exercise independently;Short Term: Able to explain program exercise prescription          Exercise Goals Re-Evaluation : Exercise Goals Re-Evaluation    Row Name 10/07/18 [phone] 10/22/18 1213 10/23/18 1639  11/05/18 1510 11/18/18 1159     Exercise Goal Re-Evaluation   Exercise Goals Review  Increase Physical Activity;Increase Strength and Stamina;Able to understand and use rate of perceived exertion (RPE) scale;Knowledge and understanding of Target Heart Rate Range (THRR);Understanding of Exercise Prescription  Increase Physical Activity;Increase Strength and Stamina;Able to understand and use rate of perceived exertion (RPE) scale;Knowledge and understanding of Target Heart Rate Range (THRR);Understanding of Exercise Prescription  Increase Physical Activity;Increase Strength and Stamina;Able to understand and use rate of perceived exertion (RPE) scale;Knowledge and understanding of Target Heart Rate Range (THRR);Able to check pulse independently;Understanding of Exercise Prescription  Increase Physical Activity;Increase Strength and Stamina;Able to understand and use rate of  perceived exertion (RPE) scale;Knowledge and understanding of Target Heart Rate Range (THRR)  Increase Physical Activity;Increase Strength and Stamina;Able to understand and use rate of perceived exertion (RPE) scale;Able to understand and use Dyspnea scale;Understanding of Exercise Prescription   Comments  [name] [name] has tolerated exercise well.  Staff will monitor progress.   [name] [name] is able to do 45 min of cardiovascular exercise.  He has improved MET level slightly.  Staff will monitor progress.  Reviewed home exercise with pt today.  Pt plans to walk for exercise.  Reviewed THR, pulse, RPE, sign and symptoms, NTG use, and when to call 911 or MD.  Also discussed weather considerations and indoor options.  Pt voiced understanding.  [name] [name] attends consistently and is up to level 3 on NS.  he reports RPE of 15-17 on TM so has not increased speed or grade yet.  Staff will monitor progress.  [name] [name] attends consistently and works in correct RPE range. He has a pacemaker that limits HR.     Expected Outcomes  Short - increase levels on NS and XR Long - improve overall MET level  Short - continue to attend consistently Long - improve MET level  Short - add one day walking when weather permits Long - exercise independently  Short - become more comfortable on TM Long - increase overall MET level  Short - work on reducing RPE on current level Long - increase levels on all machines      Discharge Exercise Prescription (Final Exercise Prescription Changes): Exercise Prescription Changes - 11/18/18 1100      Response to Exercise   Blood Pressure (Admit)  112/56    Blood Pressure (Exercise)  150/82    Blood Pressure (Exit)  130/80    Heart Rate (Admit)  66 bpm    Heart Rate (Exercise)  83 bpm    Heart Rate (Exit)  65 bpm    Rating of Perceived Exertion (Exercise)  15    Symptoms  none    Duration  Continue with 45 min of aerobic exercise without signs/symptoms of physical distress.    Intensity  Other (comment)   use RPE      Progression   Progression  Continue to progress workloads to maintain intensity without signs/symptoms of physical distress.    Average METs  2      Resistance Training   Training Prescription  Yes    Weight  3 lb    Reps  10-15      NuStep   Level  3    SPM  80    Minutes  15      Recumbant Elliptical   Level  1    RPM  50    Minutes  15    METs  1.7       Nutrition:  Target Goals: Understanding of nutrition guidelines, daily intake of sodium <1590m, cholesterol <2024m calories 30% from fat and 7% or less from saturated fats, daily to have 5 or more servings of fruits and vegetables.  Biometrics: Pre Biometrics - 09/22/18 1453      Pre Biometrics   Height  5' 8.4" (1.737 m)    Weight  186 lb 14.4 oz (84.8 kg)    Waist Circumference  40 inches    Hip Circumference  40.5 inches    Waist to Hip Ratio  0.99 %    BMI (Calculated)  28.1    Single Leg Stand  15.44 seconds      Post Biometrics - 11/26/18 1731       Post  Biometrics   Height  5' 8.4" (1.737 m)    Weight  194 lb 8 oz (88.2 kg)    Waist Circumference  40 inches    Hip Circumference  40.5 inches    Waist to Hip Ratio  0.99 %    BMI (Calculated)  29.24       Nutrition Therapy Plan and Nutrition Goals: Nutrition Therapy & Goals - 09/25/18 1044      Nutrition Therapy   Diet  DM    Drug/Food Interactions  Coumadin/Vit K    Protein (specify units)  10oz    Fiber  30 grams    Whole Grain Foods  3 servings   eats white/wheat bread    Saturated Fats  13 max. grams    Fruits and Vegetables  5 servings/day   8 ideal; eats small portions but does try to eat fruits and vegetables daily   Sodium  2000 grams      Personal Nutrition Goals   Nutrition Goal  Drink one additional glass of water per day    Personal Goal #2  Use 1/2 of a cup as as guide when portioning out foods that contain Vitamin K so that you eat the same amounts each time    Personal Goal #3  Start to think about ways to decrease sodium  in your diet    Comments  His wife is responsible for cooking and providing most food for pt. His appetite is improving post surgery and wt is returning to UBW range after losing approx 8# per wife's estimation. (06/2018) HgbA1c 5.5. Breakfast: almond honey bunches of oats with 2% milk + dried cherries or other fruit, sometimes a biscuit. Lunch: "main meal" baked potato, beans, chili beans, ham or egg salad sandwich. Dinner: soup, sandwich, nuts, cheese, mini bagel with cream cheese, casserol occasionlly (prepared by daughter). Snacks: sweets, pretzels, chocolate. They do not eat many fried foods and he is trying to cut down on sweets.      Intervention Plan   Intervention  Prescribe, educate and counsel regarding individualized specific dietary modifications aiming towards targeted core components such as weight, hypertension, lipid management, diabetes, heart failure and other comorbidities.    Expected Outcomes  Short Term Goal: Understand basic principles of dietary content, such as calories, fat, sodium, cholesterol and nutrients.;Long Term Goal: Adherence to prescribed nutrition plan.;Short Term Goal: A plan has been developed with personal nutrition goals set during dietitian appointment.       Nutrition Assessments: Nutrition Assessments - 09/22/18 1341      MEDFICTS Scores   Pre Score  47       Nutrition Goals Re-Evaluation: Nutrition Goals Re-Evaluation    Row Name 09/25/18 1104 11/24/18 1629  Goals   Nutrition Goal  Use 1/2 of a cup as as guide when portioning out foods that contain Vitamin K so that you eat the same amounts each time  [name] [name] is taking a lower dose of coumadin than before.  He is aware that greens affect his INR.  [name] [name] drinks a glass of water with meds in the morning.  He does drink soda that is sugar free and caffeine free.        Comment  He is on Coumadin and is having trouble regularing his INR  -      Expected Outcome  He will find an eating schedule  for and choose regulated portions of foods that are high in Vitamin K  Short - continue to drink water in the morning Long - maintain drinking water and hydration        Personal Goal #2 Re-Evaluation   Personal Goal #2  Drink one additional glass of water per day  -        Personal Goal #3 Re-Evaluation   Personal Goal #3  Start to think about ways to decrease sodium in your diet  -         Nutrition Goals Discharge (Final Nutrition Goals Re-Evaluation): Nutrition Goals Re-Evaluation - 11/24/18 1629      Goals   Nutrition Goal  [name] [name] is taking a lower dose of coumadin than before.  He is aware that greens affect his INR.  [place] drinks a glass of water with meds in the morning.  He does drink soda that is sugar free and caffeine free.      Expected Outcome  Short - continue to drink water in the morning Long - maintain drinking water and hydration       Psychosocial: Target Goals: Acknowledge presence or absence of significant depression and/or stress, maximize coping skills, provide positive support system. Participant is able to verbalize types and ability to use techniques and skills needed for reducing stress and depression.   Initial Review & Psychosocial Screening: Initial Psych Review & Screening - 09/22/18 1338      Initial Review   Current issues with  Current Anxiety/Panic;Current Stress Concerns   current health issues   Source of Stress Concerns  Financial;Family;Unable to participate in former interests or hobbies;Unable to perform yard/household activities      [place]?  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      Screening Interventions   Interventions  Encouraged to exercise;Program counselor consult;To provide support and  resources with identified psychosocial needs;Provide feedback about the scores to participant    Expected Outcomes  Short Term goal: Utilizing psychosocial counselor, staff and physician to assist with identification of specific Stressors or current issues interfering with healing process. Setting desired goal for each stressor or current issue identified.;Long Term Goal: Stressors or current issues are controlled or eliminated.;Short Term goal: Identification and review with participant of any Quality of Life or Depression concerns found by scoring the questionnaire.;Long Term goal: The participant improves quality of Life and PHQ9 Scores as seen by post scores and/or verbalization of changes       Quality of Life Scores:  Quality of Life - 09/22/18 1340      Quality of Life   Select  Quality of Life      Quality of Life Scores   Health/Function Pre  23.14 %    Socioeconomic Pre  26.36 %    Psych/Spiritual Pre  28.29 %    Family Pre  30 %    GLOBAL Pre  28.95 %      Scores of 19 and below usually indicate a poorer quality of life in these areas.  A difference of  2-3 points is a clinically meaningful difference.  A difference of 2-3 points in the total score of the Quality of Life Index has been associated with significant improvement in overall quality of life, self-image, physical symptoms, and general health in studies assessing change in quality of life.  PHQ-9: Recent Review Flowsheet Data    Depression screen [facility] 2/9 09/22/2018 07/08/2018 11/11/2017 11/11/2017 10/30/2016   Decreased Interest 0 0 0 0 0   Down, Depressed, Hopeless 0 0 0 0 0   PHQ - 2 Score 0 0 0 0 0   Altered sleeping 0 - 0 - -   Tired, decreased energy 1 - 0 - -   Change in appetite 0 - 0 - -   Feeling bad or failure about yourself  0 - 0 - -   Trouble concentrating 0 - 0 - -   Moving slowly or fidgety/restless 0 - 0 - -   Suicidal thoughts 0 - 0 - -   PHQ-9 Score 1 - 0 - -   Difficult doing work/chores Not  difficult at all - Not difficult at all - -     Interpretation of Total Score  Total Score Depression Severity:  1-4 = Minimal depression, 5-9 = Mild depression, 10-14 = Moderate depression, 15-19 = Moderately severe depression, 20-27 = Severe depression   Psychosocial Evaluation and Intervention: Psychosocial Evaluation - 10/20/18 1702      Psychosocial Evaluation & Interventions   Interventions  Stress management education;Relaxation education;Encouraged to exercise with the program and follow exercise prescription    Comments  Counselor met with [name] [name] 11/30/2022 - pronounced "Krista Blue") today for initial psychosocial evaluation.  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  He has a strong support system with a spouse of 79 years and (3) adult children between he and his spouse.  He also is actively involved in his local church  Nov 30, 2022 reports being in fairly good health; sleeps well and has a good appetite most of the time.  He denies a history of depression or anxiety other than situationally when he was going into surgery in August.  But reports at this time he is typically positive and rarely anxious.  He does have some stress in his life with his own health and some "problems dealing with the grandkid's problems!"  He has goals to get back into condition so he can more easily "continue walking and shopping at [facility]."  11/30/22 will be followed by staff while in this program.      Expected Outcomes  Short:  11-30-2022 will exercise for his health and for stress management. He will attend the psychoeducational component on stress and anxiety while in this program.    Long:  2022-11-30 will continue to make positive self-care choices by exercising and use of stress management strategies.[name]       Psychosocial Re-Evaluation: Psychosocial Re-Evaluation    [place] Name 10/29/18 1720 11/24/18 7824  Psychosocial Re-Evaluation    Current issues with  None Identified  Current Stress Concerns      Comments  Counselor follow up with [name] [name] today reporting he will be out of the office next week on vacation with his spouse at [facility].  He was encouraged to continue exercising consistently while out of the program.  He was struggling to work out today but was committed to be here and has a good positive attitude.  He will be followed upon his return.  [name] [name] says his only concerns are financial and they are refinancing the house.  This is manageable for him.  He does the financial work by hand and when he leaves it, he doesnt worry about it.      Expected Outcomes  Short:  [name] [name] will continue to exercise while on vacation next week.  Long:  [name] [name] will focus on his goals upon return and continue to exercise consistently.  Short - continue to use coping mechanisms to manage stress Long - maintain low stress levels         Psychosocial Discharge (Final Psychosocial Re-Evaluation): Psychosocial Re-Evaluation - 11/24/18 1632      Psychosocial Re-Evaluation   Current issues with  Current Stress Concerns    Comments  [name] [name] says his only concerns are financial and they are refinancing the house.  This is manageable for him.  He does the financial work by hand and when he leaves it, he doesnt worry about it.    Expected Outcomes  Short - continue to use coping mechanisms to manage stress Long - maintain low stress levels       Vocational Rehabilitation: Provide vocational rehab assistance to qualifying candidates.   Vocational Rehab Evaluation & Intervention: Vocational Rehab - 09/22/18 1307      Initial Vocational Rehab Evaluation & Intervention   Assessment shows need for Vocational Rehabilitation  No       Education: Education Goals: Education classes will be provided on a variety of topics geared toward better understanding of heart health and risk factor modification. Participant will state understanding/return demonstration  of topics presented as noted by education test scores.  Learning Barriers/Preferences: Learning Barriers/Preferences - 09/22/18 1306      Learning Barriers/Preferences   Learning Barriers  Hearing    Learning Preferences  None       Education Topics:  AED/CPR: - Group verbal and written instruction with the use of models to demonstrate the basic use of the AED with the basic ABC's of resuscitation.   Cardiac Rehab from 11/26/2018 in [facility] Cardiac and Pulmonary Rehab  Date  10/13/18  Educator  CE  Instruction Review Code  1- Verbalizes Understanding      General Nutrition Guidelines/Fats and Fiber: -Group instruction provided by verbal, written material, models and posters to present the general guidelines for heart healthy nutrition. Gives an explanation and review of dietary fats and fiber.   Cardiac Rehab from 11/26/2018 in [facility] Cardiac and Pulmonary Rehab  Date  10/27/18  Educator  LB  Instruction Review Code  1- Verbalizes Understanding      Controlling Sodium/Reading Food Labels: -Group verbal and written material supporting the discussion of sodium use in heart healthy nutrition. Review and explanation with models, verbal and written materials for utilization of the food label.   Cardiac Rehab from 11/26/2018 in [facility] Cardiac and Pulmonary Rehab  Date  10/29/18  Educator  LB  Instruction Review Code  1- Verbalizes Understanding      Exercise  Physiology & General Exercise Guidelines: - Group verbal and written instruction with models to review the exercise physiology of the cardiovascular system and associated critical values. Provides general exercise guidelines with specific guidelines to those with heart or lung disease.    Aerobic Exercise & Resistance Training: - Gives group verbal and written instruction on the various components of exercise. Focuses on aerobic and resistive training programs and the benefits of this training and how to safely progress through these  programs..   Flexibility, Balance, Mind/Body Relaxation: Provides group verbal/written instruction on the benefits of flexibility and balance training, including mind/body exercise modes such as yoga, pilates and tai chi.  Demonstration and skill practice provided.   Cardiac Rehab from 11/26/2018 in [facility] Cardiac and Pulmonary Rehab  Date  11/10/18  Educator  AS  Instruction Review Code  1- Verbalizes Understanding      Stress and Anxiety: - Provides group verbal and written instruction about the health risks of elevated stress and causes of high stress.  Discuss the correlation between heart/lung disease and anxiety and treatment options. Review healthy ways to manage with stress and anxiety.   Depression: - Provides group verbal and written instruction on the correlation between heart/lung disease and depressed mood, treatment options, and the stigmas associated with seeking treatment.   Cardiac Rehab from 11/26/2018 in [facility] Cardiac and Pulmonary Rehab  Date  10/22/18  Educator  Lucianne Lei, MSW  Instruction Review Code  2- Demonstrated Understanding      Anatomy & Physiology of the Heart: - Group verbal and written instruction and models provide basic cardiac anatomy and physiology, with the coronary electrical and arterial systems. Review of Valvular disease and Heart Failure   Cardiac Rehab from 11/26/2018 in [facility] Cardiac and Pulmonary Rehab  Date  11/17/18  Educator  CE  Instruction Review Code  1- Verbalizes Understanding      Cardiac Procedures: - Group verbal and written instruction to review commonly prescribed medications for heart disease. Reviews the medication, class of the drug, and side effects. Includes the steps to properly store meds and maintain the prescription regimen. (beta blockers and nitrates)   Cardiac Medications I: - Group verbal and written instruction to review commonly prescribed medications for heart disease. Reviews the medication, class of the  drug, and side effects. Includes the steps to properly store meds and maintain the prescription regimen.   Cardiac Rehab from 11/26/2018 in [facility] Cardiac and Pulmonary Rehab  Date  11/24/18  Educator  CE  Instruction Review Code  1- Verbalizes Understanding      Cardiac Medications II: -Group verbal and written instruction to review commonly prescribed medications for heart disease. Reviews the medication, class of the drug, and side effects. (all other drug classes)   Cardiac Rehab from 11/26/2018 in [facility] Cardiac and Pulmonary Rehab  Date  11/12/18  Educator  [facility]  Instruction Review Code  1- Verbalizes Understanding       Go Sex-Intimacy & Heart Disease, Get SMART - Goal Setting: - Group verbal and written instruction through game format to discuss heart disease and the return to sexual intimacy. Provides group verbal and written material to discuss and apply goal setting through the application of the S.M.A.R.T. Method.   Other Matters of the Heart: - Provides group verbal, written materials and models to describe Stable Angina and Peripheral Artery. Includes description of the disease process and treatment options available to the cardiac patient.   Exercise & Equipment Safety: - Individual verbal instruction and  demonstration of equipment use and safety with use of the equipment.   Cardiac Rehab from 11/26/2018 in [facility] Cardiac and Pulmonary Rehab  Date  09/22/18  Educator  [facility]  Instruction Review Code  1- Verbalizes Understanding      Infection Prevention: - Provides verbal and written material to individual with discussion of infection control including proper hand washing and proper equipment cleaning during exercise session.   Cardiac Rehab from 11/26/2018 in [facility] Cardiac and Pulmonary Rehab  Date  09/22/18  Educator  [facility]  Instruction Review Code  1- Verbalizes Understanding      Falls Prevention: - Provides verbal and written material to individual with discussion of  falls prevention and safety.   Cardiac Rehab from 11/26/2018 in [facility] Cardiac and Pulmonary Rehab  Date  09/22/18  Educator  [facility]  Instruction Review Code  1- Verbalizes Understanding      Diabetes: - Individual verbal and written instruction to review signs/symptoms of diabetes, desired ranges of glucose level fasting, after meals and with exercise. Acknowledge that pre and post exercise glucose checks will be done for 3 sessions at entry of program.   Cardiac Rehab from 11/26/2018 in [facility] Cardiac and Pulmonary Rehab  Date  09/22/18  Educator  [facility]  Instruction Review Code  1- Verbalizes Understanding      Know Your Numbers and Risk Factors: -Group verbal and written instruction about important numbers in your health.  Discussion of what are risk factors and how they play a role in the disease process.  Review of Cholesterol, Blood Pressure, Diabetes, and BMI and the role they play in your overall health.   Cardiac Rehab from 11/26/2018 in [facility] Cardiac and Pulmonary Rehab  Date  11/12/18  Educator  [facility]  Instruction Review Code  1- Verbalizes Understanding      Sleep Hygiene: -Provides group verbal and written instruction about how sleep can affect your health.  Define sleep hygiene, discuss sleep cycles and impact of sleep habits. Review good sleep hygiene tips.    Cardiac Rehab from 11/26/2018 in [facility] Cardiac and Pulmonary Rehab  Date  11/19/18  Educator  Lucianne Lei, MSW  Instruction Review Code  1- Verbalizes Understanding      Other: -Provides group and verbal instruction on various topics (see comments)   Knowledge Questionnaire Score: Knowledge Questionnaire Score - 09/22/18 1306      Knowledge Questionnaire Score   Pre Score  21/25   correct answers reviewed with [name] [name]. Focus on anatomy, nutrition, exercise      Core Components/Risk Factors/Patient Goals at Admission: Personal Goals and Risk Factors at Admission - 09/22/18 1305      Core Components/Risk  Factors/Patient Goals on Admission    Weight Management  Yes;Weight Maintenance    Intervention  Weight Management: Develop a combined nutrition and exercise program designed to reach desired caloric intake, while maintaining appropriate intake of nutrient and fiber, sodium and fats, and appropriate energy expenditure required for the weight goal.;Weight Management: Provide education and appropriate resources to help participant work on and attain dietary goals.    Admit Weight  185 lb (83.9 kg)    Expected Outcomes  Short Term: Continue to assess and modify interventions until short term weight is achieved;Long Term: Adherence to nutrition and physical activity/exercise program aimed toward attainment of established weight goal;Weight Maintenance: Understanding of the daily nutrition guidelines, which includes 25-35% calories from fat, 7% or less cal from saturated fats, less than 220m cholesterol, less than 1.5gm of sodium, &  5 or more servings of fruits and vegetables daily;Understanding recommendations for meals to include 15-35% energy as protein, 25-35% energy from fat, 35-60% energy from carbohydrates, less than 263m of dietary cholesterol, 20-35 gm of total fiber daily;Understanding of distribution of calorie intake throughout the day with the consumption of 4-5 meals/snacks    Diabetes  Yes    Intervention  Provide education about signs/symptoms and action to take for hypo/hyperglycemia.;Provide education about proper nutrition, including hydration, and aerobic/resistive exercise prescription along with prescribed medications to achieve blood glucose in normal ranges: Fasting glucose 65-99 mg/dL    Expected Outcomes  Short Term: Participant verbalizes understanding of the signs/symptoms and immediate care of hyper/hypoglycemia, proper foot care and importance of medication, aerobic/resistive exercise and nutrition plan for blood glucose control.;Long Term: Attainment of HbA1C < 7%.    Lipids  Yes     Intervention  Provide education and support for participant on nutrition & aerobic/resistive exercise along with prescribed medications to achieve LDL <73m HDL >4025m   Expected Outcomes  Short Term: Participant states understanding of desired cholesterol values and is compliant with medications prescribed. Participant is following exercise prescription and nutrition guidelines.;Long Term: Cholesterol controlled with medications as prescribed, with individualized exercise RX and with personalized nutrition plan. Value goals: LDL < 72m4mDL > 40 mg.       Core Components/Risk Factors/Patient Goals Review:  Goals and Risk Factor Review    Row Name 10/23/18 1640 11/24/18 1623           Core Components/Risk Factors/Patient Goals Review   Personal Goals Review  Weight Management/Obesity;Improve shortness of breath with ADL's;Diabetes;Hypertension;Lipids  Weight Management/Obesity;Improve shortness of breath with ADL's;Diabetes;Hypertension;Lipids      Review  [name] [name] is taking meds as directed.  He has a BP cuff at home but doesnt use it.  His BG at home has been 130-140 ( he admits sneaking chocolate to eat)  He has noticed he can carry full load of laundry and groceries easier than before starting HT.  [name] [name] is taking all meds as directed.  He will start checking BP at [facility] at [facility] is still running around 140.  [name] [name] had A1c 3 months ago and his Dr was satisfied with it.  He still admits eating chocolate and sweets"more than I should."        Expected Outcomes  Short - check BP at home once a day Long - monitor BP, BG at home as well as follow Dr advice to achieve optimal level  Short - [facility] - He plans to join a gym wih his wife after graduation         Core Components/Risk Factors/Patient Goals at Discharge (Final Review):  Goals and Risk Factor Review - 11/24/18 1623      Core Components/Risk Factors/Patient Goals Review   Personal Goals  Review  Weight Management/Obesity;Improve shortness of breath with ADL's;Diabetes;Hypertension;Lipids    Review  [name] [name] is taking all meds as directed.  He will start checking BP at home at least on days not at [name] [name] is still running around 140.  [name] [name] had A1c 3 months ago and his Dr was satisfied with it.  He still admits eating chocolate and sweets"more than I should."      Expected Outcomes  Short - [facility] - He plans to join a gym wih his wife after graduation  ITP Comments: ITP Comments    Row Name 09/22/18 1301 10/06/18 1649 10/14/18 0641 11/12/18 1400     ITP Comments  Med Review completed. Initial ITP created. Diagnosis can be found in [facility] 8/19  [name] [name] reports his PM has changed to on demand at 70 bpm  30 Day Review. Continue with ITP unless directed changes per Medical Director review  30 day review completed. ITP sent to Dr. Emily Filbert, Medical Director of Cardiac Rehab. Continue with ITP unless changes are made by physician.       Comments: Is in the hospital now and so we graduated him early at 35/36.

## 2018-12-01 NOTE — Progress Notes (Signed)
Cardiac Individual Treatment Plan  Patient Details  Name: HERLEY BERNARDINI MRN: [id number] Date of Birth: 11-Apr-1980 Referring Provider:     Cardiac Rehab from 09/22/2018 in [facility] Cardiac and Pulmonary Rehab  Referring Provider  Kathlyn Sacramento MD      Initial Encounter Date:    Cardiac Rehab from 09/22/2018 in [facility] Cardiac and Pulmonary Rehab  Date  [birth year]      Visit Diagnosis: No diagnosis found.  Patient's Home Medications on Admission: No current facility-administered medications for this visit.  No current outpatient medications on file.  Facility-Administered Medications Ordered in Other Visits:  .  acetaminophen (TYLENOL) tablet 650 mg, 650 mg, Oral, Q6H PRN, Jodell Cipro, Prasanna, MD, 650 mg at 12/01/18 0200 .  aspirin EC tablet 81 mg, 81 mg, Oral, Daily, Jodell Cipro, Prasanna, MD, 81 mg at 12/01/18 1007 .  bisacodyl (DULCOLAX) EC tablet 5 mg, 5 mg, Oral, Daily PRN, Arta Silence, MD .  diphenhydrAMINE (BENADRYL) capsule 25 mg, 25 mg, Oral, TID PRN, Fritzi Mandes, MD .  insulin aspart (novoLOG) injection 0-15 Units, 0-15 Units, Subcutaneous, TID WC, Sridharan, Prasanna, MD .  insulin aspart (novoLOG) injection 0-5 Units, 0-5 Units, Subcutaneous, QHS, Sridharan, Prasanna, MD .  latanoprost (XALATAN) 0.005 % ophthalmic solution 1 drop, 1 drop, Both Eyes, QHS, Sridharan, Prasanna, MD .  levothyroxine (SYNTHROID, LEVOTHROID) tablet 50 mcg, 50 mcg, Oral, Daily, Jodell Cipro, Prasanna, MD, 50 mcg at 12/01/18 1007 .  [START ON 12/02/2018] metFORMIN (GLUCOPHAGE) tablet 1,000 mg, 1,000 mg, Oral, Q breakfast, Fritzi Mandes, MD .  metoprolol tartrate (LOPRESSOR) tablet 12.5 mg, 12.5 mg, Oral, BID, Jodell Cipro, Prasanna, MD, 12.5 mg at 12/01/18 1007 .  pantoprazole (PROTONIX) EC tablet 40 mg, 40 mg, Oral, Daily, Jodell Cipro, Prasanna, MD, 40 mg at 12/01/18 1007 .  promethazine (PHENERGAN) injection 12.5 mg, 12.5 mg, Intravenous, Q6H PRN, Jodell Cipro, Prasanna, MD .  senna-docusate (Senokot-S)  tablet 1 tablet, 1 tablet, Oral, QHS PRN, Arta Silence, MD .  simvastatin (ZOCOR) tablet 20 mg, 20 mg, Oral, q1800, Jodell Cipro, Prasanna, MD .  warfarin (COUMADIN) tablet 4.5 mg, 4.5 mg, Oral, Once per day on Sun Mon Tue Wed Thu Sat **AND** [START ON 12/05/2018] warfarin (COUMADIN) tablet 6 mg, 6 mg, Oral, Q Fri-1800, Arta Silence, MD .  Warfarin - Pharmacist Dosing Inpatient, , Does not apply, [id number], Arta Silence, MD  Past Medical History: Past Medical History:  Diagnosis Date  . Atherosclerosis of aorta ([place])    by CT scan in past  . Atrial fibrillation ([place])    and aflutter. pt has a left atrial circuit that is not ablated. was on amiodarone-stopped, now use rate control.   . Bladder cancer ([place])    hx; treated with BCG in past   . Carotid artery disease ([place])    There was calcified plaque but no stenosis by carotid artery screening done at [facility] October, 2013  . CHB (complete heart block) ([place])    a. 05/2018 s/p BSX VVIR PPM ([facility]) in setting of bradycardia following myomectomy and AVR/MVR.  . Diabetes mellitus    type not specified  . Diabetic neuropathy (H[facility])    feet  . Elevated liver enzymes    over time; hx  . Excessive sweating   . Glaucoma   . Gout   . Head injury    when slipped on ice 2004-2005. stabilized and back on Coumadin   . Headache    migraines - distant past  . History of cardiac cath    a. 05/2018 Cath (  [facility]): Non-obstructive CAD. Mildly elevated filling pressures w/ nl CI.  [name] HOCM (hypertrophic obstructive cardiomyopathy) ([place])    a. 01/2016 Echo: EF 65-70%, no rwma, LVOT gradient of 80-43mHg, mod AS, SAM; b. 11/2017 Echo: EF 55-60%, near cavity obliteration in systole, mod AS, mild MS; c. 04/2018 Card MRI ([facility]): Sev LVH, EF >70%, Sev AS, LVOT obs w/ Sev MR, mild to mod TR/PR, mid-myocardial HE in basal-mid anteroseptum and inferoseptum; d. 05/2018 s/p Septal myomectomy; e. 05/2018 Echo: Nl EF w/ sev LVH. Mech AoV and MV.  .  [place]in[place]    elevated, mild   . Hypercholesterolemia    treated.   . Hypertension   . Kidney mass    laproscopic surgery woth cry[facility]ablation of [facility]   . Moderate aortic stenosis    a. 01/2016 echo: mod AS.   .Marland KitchenMotion sickness    ocean boats  . Nausea   . Obstructive sleep apnea    CPAP started successfully 2014  . Orthostatic hypotension    a. orthostatic. dehydration. hospitalized 11/11  . Sleep apnea    Significant obstructive sleep apnea diagnosed in August, 2012, the patient is to receive CPAP   . Stroke ([facility]    "2 old strokes" CT and MRI. [place] hospital 11/11. diagnosis was dehydration, no acutal reports.   . TSH elevation    on synthroid historically   . Unsteady gait    August, 2012    Tobacco Use: Social History   Tobacco Use  Smoking Status Former Smoker  . Types: Cigars, Cigarettes  . Last attempt to quit: 10/02/1974  . Years since quitting: 44.1  Smokeless Tobacco Never Used  Tobacco Comment   still smokes cigars once a day    Labs: Recent Review Flowsheet Data    Labs for ITP Cardiac and Pulmonary Rehab Latest Ref Rng & Units 12/10/2016 03/30/2017 08/05/2017 11/12/2017 07/10/2018   Cholestrol 100 - 199 mg/dL 168 137 - 147 -   LDLCALC 0 - 99 mg/dL 96 70 - 82 -   HDL >39 mg/dL 41 36 - 41 -   Trlycerides 0 - 149 mg/dL 155(H) 153 - 120 -   Hemoglobin A1c 4.8 - 5.6 % 6.6(H) 7.4 6.5 6.3(H) 5.5       Exercise Target Goals: Exercise Program Goal: Individual exercise prescription set using results from initial 6 min walk test and THRR while considering  patient's activity barriers and safety.   Exercise Prescription Goal: Initial exercise prescription builds to 30-45 minutes a day of aerobic activity, 2-3 days per week.  Home exercise guidelines will be given to patient during program as part of exercise prescription that the participant will acknowledge.  Activity Barriers & Risk Stratification: Activity  Barriers & Cardiac Risk Stratification - 09/22/18 1341      Activity Barriers & Cardiac Risk Stratification   Activity Barriers  Decreased Ventricular Function;Arthritis;Deconditioning;Muscular Weakness   left knee pain from dislocation in college   Cardiac Risk Stratification  High       6 Minute Walk: 6 Minute Walk    Row Name 09/22/18 1352 11/26/18 1727       6 Minute Walk   Phase  Initial  Discharge    Distance  1015 feet  1135 feet    Distance % Change  -  11.82 %    Distance Feet Change  -  120 ft    Walk Time  6 minutes  6 minutes    #  of Rest Breaks  -  0    MPH  1.92  2.14    METS  2  1.8    RPE  13  15    Perceived Dyspnea   1  1    VO2 Peak  7.01  6.33    Symptoms  Yes (comment)  Yes (comment)    Comments  SOB, unsteady gait in shoes  Some shortness of breath    Resting HR  80 bpm  86 bpm    Resting BP  130/70  128/70    Resting Oxygen Saturation   98 %  -    Exercise Oxygen Saturation  during 6 min walk  94 %  -    Max Ex. HR  107 bpm  130 bpm    Max Ex. BP  156/76  142/60    2 Minute Post BP  134/64  -       Oxygen Initial Assessment:   Oxygen Re-Evaluation:   Oxygen Discharge (Final Oxygen Re-Evaluation):   Initial Exercise Prescription: Initial Exercise Prescription - 09/22/18 1400      Date of Initial Exercise RX and Referring Provider   Date  [birth year]    Referring Provider  Kathlyn Sacramento MD      Treadmill   MPH  1.3    Grade  0.5    Minutes  15    METs  2.09      NuStep   Level  1    SPM  80    Minutes  15    METs  2      Recumbant Elliptical   Level  1    RPM  50    Minutes  15    METs  2      Prescription Details   Frequency (times per week)  3    Duration  Progress to 45 minutes of aerobic exercise without signs/symptoms of physical distress      Intensity   THRR 40-80% of Max Heartrate  103-126    Ratings of Perceived Exertion  11-13    Perceived Dyspnea  0-4      Progression   Progression  Continue to progress  workloads to maintain intensity without signs/symptoms of physical distress.      Resistance Training   Training Prescription  Yes    Weight  3 lbs    Reps  10-15       Perform Capillary Blood Glucose checks as needed.  Exercise Prescription Changes: Exercise Prescription Changes    Row Name 09/22/18 1400 10/07/18 0900 10/22/18 1200 11/05/18 1500 11/18/18 1100     Response to Exercise   Blood Pressure (Admit)  130/70  130/80  124/70  138/64  112/56   Blood Pressure (Exercise)  156/76  148/64  138/82  158/73  150/82   Blood Pressure (Exit)  134/64  150/80  122/82  128/60  130/80   Heart Rate (Admit)  80 bpm  75 bpm  80 bpm  70 bpm  66 bpm   Heart Rate (Exercise)  100 bpm  130 bpm  96 bpm  131 bpm  83 bpm   Heart Rate (Exit)  76 bpm  73 bpm  58 bpm  70 bpm  65 bpm   Oxygen Saturation (Admit)  98 %  -  -  -  -   Oxygen Saturation (Exercise)  94 %  -  -  -  -   Rating of Perceived Exertion (Exercise)  _0 Perceived Dyspnea (Exercise)  1  -  -  -  -   Symptoms  SOB, usteady shoes, wobbly gait  none  none  none  none   Comments  walk test results  PM changed to 70  -  -  -   Duration  -  Progress to 45 minutes of aerobic exercise without signs/symptoms of physical distress  Continue with 45 min of aerobic exercise without signs/symptoms of physical distress.  Continue with 45 min of aerobic exercise without signs/symptoms of physical distress.  Continue with 45 min of aerobic exercise without signs/symptoms of physical distress.   Intensity  -  THRR unchanged  Other (comment) use  rpe  Other (comment)  Other (comment) use RPE     Progression   Progression  -  Continue to progress workloads to maintain intensity without signs/symptoms of physical distress.  Continue to progress workloads to maintain intensity without signs/symptoms of physical distress.  Continue to progress workloads to maintain intensity without signs/symptoms of physical distress.  Continue to progress  workloads to maintain intensity without signs/symptoms of physical distress.   Average METs  -  1.76  _1 Resistance Training   Training Prescription  -  Yes  Yes  Yes  Yes   Weight  -  3 lb  3 lb  3 lb  3 lb   Reps  -  10-15  10-15  10-15  10-15     Treadmill   MPH  -  1.3  -  1.3  -   Grade  -  0.5  -  0.5  -   Minutes  -  15  -  15  -   METs  -  2.09  -  2.08  -     NuStep   Level  -  1  -  3  3   SPM  -  80  -  80  80   Minutes  -  15  -  15  15   METs  -  1.7  -  2.2  -     Recumbant Elliptical   Level  -  1  -  1  1   RPM  -  50  -  50  50   Minutes  -  15  -  15  15   METs  -  1.5  -  1.7  1.7      Exercise Comments: Exercise Comments    Row Name 09/24/18 1633 10/23/18 1639         Exercise Comments  irst full day of exercise!  Patient was oriented to gym and equipment including functions, settings, policies, and procedures.  Patient's individual exercise prescription and treatment plan were reviewed.  All starting workloads were established based on the results of the 6 minute walk test done at initial orientation visit.  The plan for exercise progression was also introduced and progression will be customized based on patient's performance and goals.  Reviewed home exercise with pt today.  Pt plans to walk for exercise.  Reviewed THR, pulse, RPE, sign and symptoms, NTG use, and when to call 911 or MD.  Also discussed weather considerations and indoor options.  Pt voiced understanding.         Exercise Goals and Review: Exercise Goals    Row Name 09/22/18 1452  Exercise Goals   Increase Physical Activity  Yes       Intervention  Provide advice, education, support and counseling about physical activity/exercise needs.;Develop an individualized exercise prescription for aerobic and resistive training based on initial evaluation findings, risk stratification, comorbidities and participant's personal goals.       Expected Outcomes  Short Term:  Attend rehab on a regular basis to increase amount of physical activity.;Long Term: Add in home exercise to make exercise part of routine and to increase amount of physical activity.;Long Term: Exercising regularly at least 3-5 days a week.       Increase Strength and Stamina  Yes       Intervention  Provide advice, education, support and counseling about physical activity/exercise needs.;Develop an individualized exercise prescription for aerobic and resistive training based on initial evaluation findings, risk stratification, comorbidities and participant's personal goals.       Expected Outcomes  Short Term: Increase workloads from initial exercise prescription for resistance, speed, and METs.;Short Term: Perform resistance training exercises routinely during rehab and add in resistance training at home;Long Term: Improve cardiorespiratory fitness, muscular endurance and strength as measured by increased METs and functional capacity (6MWT)       Able to understand and use rate of perceived exertion (RPE) scale  Yes       Intervention  Provide education and explanation on how to use RPE scale       Expected Outcomes  Short Term: Able to use RPE daily in rehab to express subjective intensity level;Long Term:  Able to use RPE to guide intensity level when exercising independently       Able to understand and use Dyspnea scale  Yes       Intervention  Provide education and explanation on how to use Dyspnea scale       Expected Outcomes  Short Term: Able to use Dyspnea scale daily in rehab to express subjective sense of shortness of breath during exertion;Long Term: Able to use Dyspnea scale to guide intensity level when exercising independently       Knowledge and understanding of Target Heart Rate Range (THRR)  Yes       Intervention  Provide education and explanation of THRR including how the numbers were predicted and where they are located for reference       Expected Outcomes  Short Term: Able to  state/look up THRR;Short Term: Able to use daily as guideline for intensity in rehab;Long Term: Able to use THRR to govern intensity when exercising independently       Able to check pulse independently  Yes       Intervention  Provide education and demonstration on how to check pulse in carotid and radial arteries.;Review the importance of being able to check your own pulse for safety during independent exercise       Expected Outcomes  Short Term: Able to explain why pulse checking is important during independent exercise;Long Term: Able to check pulse independently and accurately       Understanding of Exercise Prescription  Yes       Intervention  Provide education, explanation, and written materials on patient's individual exercise prescription       Expected Outcomes  Long Term: Able to explain home exercise prescription to exercise independently;Short Term: Able to explain program exercise prescription          Exercise Goals Re-Evaluation : Exercise Goals Re-Evaluation    Row Name 10/07/18 [phone] 10/22/18 1213 10/23/18 1639  11/05/18 1510 11/18/18 1159     Exercise Goal Re-Evaluation   Exercise Goals Review  Increase Physical Activity;Increase Strength and Stamina;Able to understand and use rate of perceived exertion (RPE) scale;Knowledge and understanding of Target Heart Rate Range (THRR);Understanding of Exercise Prescription  Increase Physical Activity;Increase Strength and Stamina;Able to understand and use rate of perceived exertion (RPE) scale;Knowledge and understanding of Target Heart Rate Range (THRR);Understanding of Exercise Prescription  Increase Physical Activity;Increase Strength and Stamina;Able to understand and use rate of perceived exertion (RPE) scale;Knowledge and understanding of Target Heart Rate Range (THRR);Able to check pulse independently;Understanding of Exercise Prescription  Increase Physical Activity;Increase Strength and Stamina;Able to understand and use rate of  perceived exertion (RPE) scale;Knowledge and understanding of Target Heart Rate Range (THRR)  Increase Physical Activity;Increase Strength and Stamina;Able to understand and use rate of perceived exertion (RPE) scale;Able to understand and use Dyspnea scale;Understanding of Exercise Prescription   Comments  Armstrong has tolerated exercise well.  Staff will monitor progress.   Polk is able to do 45 min of cardiovascular exercise.  He has improved MET level slightly.  Staff will monitor progress.  Reviewed home exercise with pt today.  Pt plans to walk for exercise.  Reviewed THR, pulse, RPE, sign and symptoms, NTG use, and when to call 911 or MD.  Also discussed weather considerations and indoor options.  Pt voiced understanding.  Travin attends consistently and is up to level 3 on NS.  he reports RPE of 15-17 on TM so has not increased speed or grade yet.  Staff will monitor progress.  Giann attends consistently and works in correct RPE range. He has a pacemaker that limits HR.     Expected Outcomes  Short - increase levels on NS and XR Long - improve overall MET level  Short - continue to attend consistently Long - improve MET level  Short - add one day walking when weather permits Long - exercise independently  Short - become more comfortable on TM Long - increase overall MET level  Short - work on reducing RPE on current level Long - increase levels on all machines      Discharge Exercise Prescription (Final Exercise Prescription Changes): Exercise Prescription Changes - 11/18/18 1100      Response to Exercise   Blood Pressure (Admit)  112/56    Blood Pressure (Exercise)  150/82    Blood Pressure (Exit)  130/80    Heart Rate (Admit)  66 bpm    Heart Rate (Exercise)  83 bpm    Heart Rate (Exit)  65 bpm    Rating of Perceived Exertion (Exercise)  15    Symptoms  none    Duration  Continue with 45 min of aerobic exercise without signs/symptoms of physical distress.    Intensity  Other (comment)   use RPE      Progression   Progression  Continue to progress workloads to maintain intensity without signs/symptoms of physical distress.    Average METs  2      Resistance Training   Training Prescription  Yes    Weight  3 lb    Reps  10-15      NuStep   Level  3    SPM  80    Minutes  15      Recumbant Elliptical   Level  1    RPM  50    Minutes  15    METs  1.7       Nutrition:  Target Goals: Understanding of nutrition guidelines, daily intake of sodium <1590m, cholesterol <2024m calories 30% from fat and 7% or less from saturated fats, daily to have 5 or more servings of fruits and vegetables.  Biometrics: Pre Biometrics - 09/22/18 1453      Pre Biometrics   Height  5' 8.4" (1.737 m)    Weight  186 lb 14.4 oz (84.8 kg)    Waist Circumference  40 inches    Hip Circumference  40.5 inches    Waist to Hip Ratio  0.99 %    BMI (Calculated)  28.1    Single Leg Stand  15.44 seconds      Post Biometrics - 11/26/18 1731       Post  Biometrics   Height  5' 8.4" (1.737 m)    Weight  194 lb 8 oz (88.2 kg)    Waist Circumference  40 inches    Hip Circumference  40.5 inches    Waist to Hip Ratio  0.99 %    BMI (Calculated)  29.24       Nutrition Therapy Plan and Nutrition Goals: Nutrition Therapy & Goals - 09/25/18 1044      Nutrition Therapy   Diet  DM    Drug/Food Interactions  Coumadin/Vit K    Protein (specify units)  10oz    Fiber  30 grams    Whole Grain Foods  3 servings   eats white/wheat bread    Saturated Fats  13 max. grams    Fruits and Vegetables  5 servings/day   8 ideal; eats small portions but does try to eat fruits and vegetables daily   Sodium  2000 grams      Personal Nutrition Goals   Nutrition Goal  Drink one additional glass of water per day    Personal Goal #2  Use 1/2 of a cup as as guide when portioning out foods that contain Vitamin K so that you eat the same amounts each time    Personal Goal #3  Start to think about ways to decrease sodium  in your diet    Comments  His wife is responsible for cooking and providing most food for pt. His appetite is improving post surgery and wt is returning to UBW range after losing approx 8# per wife's estimation. (06/2018) HgbA1c 5.5. Breakfast: almond honey bunches of oats with 2% milk + dried cherries or other fruit, sometimes a biscuit. Lunch: "main meal" baked potato, beans, chili beans, ham or egg salad sandwich. Dinner: soup, sandwich, nuts, cheese, mini bagel with cream cheese, casserol occasionlly (prepared by daughter). Snacks: sweets, pretzels, chocolate. They do not eat many fried foods and he is trying to cut down on sweets.      Intervention Plan   Intervention  Prescribe, educate and counsel regarding individualized specific dietary modifications aiming towards targeted core components such as weight, hypertension, lipid management, diabetes, heart failure and other comorbidities.    Expected Outcomes  Short Term Goal: Understand basic principles of dietary content, such as calories, fat, sodium, cholesterol and nutrients.;Long Term Goal: Adherence to prescribed nutrition plan.;Short Term Goal: A plan has been developed with personal nutrition goals set during dietitian appointment.       Nutrition Assessments: Nutrition Assessments - 09/22/18 1341      MEDFICTS Scores   Pre Score  47       Nutrition Goals Re-Evaluation: Nutrition Goals Re-Evaluation    Row Name 09/25/18 1104 11/24/18 1629  Goals   Nutrition Goal  Use 1/2 of a cup as as guide when portioning out foods that contain Vitamin K so that you eat the same amounts each time  Denzal is taking a lower dose of coumadin than before.  He is aware that greens affect his INR.  Marven drinks a glass of water with meds in the morning.  He does drink soda that is sugar free and caffeine free.        Comment  He is on Coumadin and is having trouble regularing his INR  -      Expected Outcome  He will find an eating schedule  for and choose regulated portions of foods that are high in Vitamin K  Short - continue to drink water in the morning Long - maintain drinking water and hydration        Personal Goal #2 Re-Evaluation   Personal Goal #2  Drink one additional glass of water per day  -        Personal Goal #3 Re-Evaluation   Personal Goal #3  Start to think about ways to decrease sodium in your diet  -         Nutrition Goals Discharge (Final Nutrition Goals Re-Evaluation): Nutrition Goals Re-Evaluation - 11/24/18 1629      Goals   Nutrition Goal  Deegan is taking a lower dose of coumadin than before.  He is aware that greens affect his INR.  Dain drinks a glass of water with meds in the morning.  He does drink soda that is sugar free and caffeine free.      Expected Outcome  Short - continue to drink water in the morning Long - maintain drinking water and hydration       Psychosocial: Target Goals: Acknowledge presence or absence of significant depression and/or stress, maximize coping skills, provide positive support system. Participant is able to verbalize types and ability to use techniques and skills needed for reducing stress and depression.   Initial Review & Psychosocial Screening: Initial Psych Review & Screening - 09/22/18 1338      Initial Review   Current issues with  Current Anxiety/Panic;Current Stress Concerns   current health issues   Source of Stress Concerns  Financial;Family;Unable to participate in former interests or hobbies;Unable to perform yard/household activities      [place]?  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      Screening Interventions   Interventions  Encouraged to exercise;Program counselor consult;To provide support and  resources with identified psychosocial needs;Provide feedback about the scores to participant    Expected Outcomes  Short Term goal: Utilizing psychosocial counselor, staff and physician to assist with identification of specific Stressors or current issues interfering with healing process. Setting desired goal for each stressor or current issue identified.;Long Term Goal: Stressors or current issues are controlled or eliminated.;Short Term goal: Identification and review with participant of any Quality of Life or Depression concerns found by scoring the questionnaire.;Long Term goal: The participant improves quality of Life and PHQ9 Scores as seen by post scores and/or verbalization of changes       Quality of Life Scores:  Quality of Life - 09/22/18 1340      Quality of Life   Select  Quality of Life      Quality of Life Scores   Health/Function Pre  23.14 %    Socioeconomic Pre  26.36 %    Psych/Spiritual Pre  28.29 %    Family Pre  30 %    GLOBAL Pre  28.95 %      Scores of 19 and below usually indicate a poorer quality of life in these areas.  A difference of  2-3 points is a clinically meaningful difference.  A difference of 2-3 points in the total score of the Quality of Life Index has been associated with significant improvement in overall quality of life, self-image, physical symptoms, and general health in studies assessing change in quality of life.  PHQ-9: Recent Review Flowsheet Data    Depression screen [facility] 2/9 09/22/2018 07/08/2018 11/11/2017 11/11/2017 10/30/2016   Decreased Interest 0 0 0 0 0   Down, Depressed, Hopeless 0 0 0 0 0   PHQ - 2 Score 0 0 0 0 0   Altered sleeping 0 - 0 - -   Tired, decreased energy 1 - 0 - -   Change in appetite 0 - 0 - -   Feeling bad or failure about yourself  0 - 0 - -   Trouble concentrating 0 - 0 - -   Moving slowly or fidgety/restless 0 - 0 - -   Suicidal thoughts 0 - 0 - -   PHQ-9 Score 1 - 0 - -   Difficult doing work/chores Not  difficult at all - Not difficult at all - -     Interpretation of Total Score  Total Score Depression Severity:  1-4 = Minimal depression, 5-9 = Mild depression, 10-14 = Moderate depression, 15-19 = Moderately severe depression, 20-27 = Severe depression   Psychosocial Evaluation and Intervention: Psychosocial Evaluation - 10/20/18 1702      Psychosocial Evaluation & Interventions   Interventions  Stress management education;Relaxation education;Encouraged to exercise with the program and follow exercise prescription    Comments  Counselor met with Mr. Gautreau 11/30/2022 - pronounced "Krista Blue") today for initial psychosocial evaluation.  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  He has a strong support system with a spouse of 79 years and (3) adult children between he and his spouse.  He also is actively involved in his local church  Nov 30, 2022 reports being in fairly good health; sleeps well and has a good appetite most of the time.  He denies a history of depression or anxiety other than situationally when he was going into surgery in August.  But reports at this time he is typically positive and rarely anxious.  He does have some stress in his life with his own health and some "problems dealing with the grandkid's problems!"  He has goals to get back into condition so he can more easily "continue walking and shopping at [facility]."  11/30/22 will be followed by staff while in this program.      Expected Outcomes  Short:  11-30-2022 will exercise for his health and for stress management. He will attend the psychoeducational component on stress and anxiety while in this program.    Long:  2022-11-30 will continue to make positive self-care choices by exercising and use of stress management strategies.[name]       Psychosocial Re-Evaluation: Psychosocial Re-Evaluation    [place]son Name 10/29/18 1720 11/24/18 7824  Psychosocial Re-Evaluation    Current issues with  None Identified  Current Stress Concerns      Comments  Counselor follow up with Dashaun today reporting he will be out of the office next week on vacation with his spouse at [facility].  He was encouraged to continue exercising consistently while out of the program.  He was struggling to work out today but was committed to be here and has a good positive attitude.  He will be followed upon his return.  Colburn says his only concerns are financial and they are refinancing the house.  This is manageable for him.  He does the financial work by hand and when he leaves it, he doesnt worry about it.      Expected Outcomes  Short:  Tayson will continue to exercise while on vacation next week.  Long:  Olof will focus on his goals upon return and continue to exercise consistently.  Short - continue to use coping mechanisms to manage stress Long - maintain low stress levels         Psychosocial Discharge (Final Psychosocial Re-Evaluation): Psychosocial Re-Evaluation - 11/24/18 1632      Psychosocial Re-Evaluation   Current issues with  Current Stress Concerns    Comments  Damione says his only concerns are financial and they are refinancing the house.  This is manageable for him.  He does the financial work by hand and when he leaves it, he doesnt worry about it.    Expected Outcomes  Short - continue to use coping mechanisms to manage stress Long - maintain low stress levels       Vocational Rehabilitation: Provide vocational rehab assistance to qualifying candidates.   Vocational Rehab Evaluation & Intervention: Vocational Rehab - 09/22/18 1307      Initial Vocational Rehab Evaluation & Intervention   Assessment shows need for Vocational Rehabilitation  No       Education: Education Goals: Education classes will be provided on a variety of topics geared toward better understanding of heart health and risk factor modification. Participant will state understanding/return demonstration  of topics presented as noted by education test scores.  Learning Barriers/Preferences: Learning Barriers/Preferences - 09/22/18 1306      Learning Barriers/Preferences   Learning Barriers  Hearing    Learning Preferences  None       Education Topics:  AED/CPR: - Group verbal and written instruction with the use of models to demonstrate the basic use of the AED with the basic ABC's of resuscitation.   Cardiac Rehab from 11/26/2018 in [facility] Cardiac and Pulmonary Rehab  Date  10/13/18  Educator  CE  Instruction Review Code  1- Verbalizes Understanding      General Nutrition Guidelines/Fats and Fiber: -Group instruction provided by verbal, written material, models and posters to present the general guidelines for heart healthy nutrition. Gives an explanation and review of dietary fats and fiber.   Cardiac Rehab from 11/26/2018 in [facility] Cardiac and Pulmonary Rehab  Date  10/27/18  Educator  LB  Instruction Review Code  1- Verbalizes Understanding      Controlling Sodium/Reading Food Labels: -Group verbal and written material supporting the discussion of sodium use in heart healthy nutrition. Review and explanation with models, verbal and written materials for utilization of the food label.   Cardiac Rehab from 11/26/2018 in [facility] Cardiac and Pulmonary Rehab  Date  10/29/18  Educator  LB  Instruction Review Code  1- Verbalizes Understanding      Exercise  Physiology & General Exercise Guidelines: - Group verbal and written instruction with models to review the exercise physiology of the cardiovascular system and associated critical values. Provides general exercise guidelines with specific guidelines to those with heart or lung disease.    Aerobic Exercise & Resistance Training: - Gives group verbal and written instruction on the various components of exercise. Focuses on aerobic and resistive training programs and the benefits of this training and how to safely progress through these  programs..   Flexibility, Balance, Mind/Body Relaxation: Provides group verbal/written instruction on the benefits of flexibility and balance training, including mind/body exercise modes such as yoga, pilates and tai chi.  Demonstration and skill practice provided.   Cardiac Rehab from 11/26/2018 in [facility] Cardiac and Pulmonary Rehab  Date  11/10/18  Educator  AS  Instruction Review Code  1- Verbalizes Understanding      Stress and Anxiety: - Provides group verbal and written instruction about the health risks of elevated stress and causes of high stress.  Discuss the correlation between heart/lung disease and anxiety and treatment options. Review healthy ways to manage with stress and anxiety.   Depression: - Provides group verbal and written instruction on the correlation between heart/lung disease and depressed mood, treatment options, and the stigmas associated with seeking treatment.   Cardiac Rehab from 11/26/2018 in [facility] Cardiac and Pulmonary Rehab  Date  10/22/18  Educator  Lucianne Lei, MSW  Instruction Review Code  2- Demonstrated Understanding      Anatomy & Physiology of the Heart: - Group verbal and written instruction and models provide basic cardiac anatomy and physiology, with the coronary electrical and arterial systems. Review of Valvular disease and Heart Failure   Cardiac Rehab from 11/26/2018 in [facility] Cardiac and Pulmonary Rehab  Date  11/17/18  Educator  CE  Instruction Review Code  1- Verbalizes Understanding      Cardiac Procedures: - Group verbal and written instruction to review commonly prescribed medications for heart disease. Reviews the medication, class of the drug, and side effects. Includes the steps to properly store meds and maintain the prescription regimen. (beta blockers and nitrates)   Cardiac Medications I: - Group verbal and written instruction to review commonly prescribed medications for heart disease. Reviews the medication, class of the  drug, and side effects. Includes the steps to properly store meds and maintain the prescription regimen.   Cardiac Rehab from 11/26/2018 in [facility] Cardiac and Pulmonary Rehab  Date  11/24/18  Educator  CE  Instruction Review Code  1- Verbalizes Understanding      Cardiac Medications II: -Group verbal and written instruction to review commonly prescribed medications for heart disease. Reviews the medication, class of the drug, and side effects. (all other drug classes)   Cardiac Rehab from 11/26/2018 in [facility] Cardiac and Pulmonary Rehab  Date  11/12/18  Educator  [facility]  Instruction Review Code  1- Verbalizes Understanding       Go Sex-Intimacy & Heart Disease, Get SMART - Goal Setting: - Group verbal and written instruction through game format to discuss heart disease and the return to sexual intimacy. Provides group verbal and written material to discuss and apply goal setting through the application of the S.M.A.R.T. Method.   Other Matters of the Heart: - Provides group verbal, written materials and models to describe Stable Angina and Peripheral Artery. Includes description of the disease process and treatment options available to the cardiac patient.   Exercise & Equipment Safety: - Individual verbal instruction and  demonstration of equipment use and safety with use of the equipment.   Cardiac Rehab from 11/26/2018 in [facility] Cardiac and Pulmonary Rehab  Date  09/22/82  Educator  [facility]  Instruction Review Code  1- Verbalizes Understanding      Infection Prevention: - Provides verbal and written material to individual with discussion of infection control including proper hand washing and proper equipment cleaning during exercise session.   Cardiac Rehab from 11/26/2018 in [facility] Cardiac and Pulmonary Rehab  Date  09/22/82  Educator  [facility]  Instruction Review Code  1- Verbalizes Understanding      Falls Prevention: - Provides verbal and written material to individual with discussion of  falls prevention and safety.   Cardiac Rehab from 11/26/2018 in [facility] Cardiac and Pulmonary Rehab  Date  09/22/82  Educator  [facility]  Instruction Review Code  1- Verbalizes Understanding      Diabetes: - Individual verbal and written instruction to review signs/symptoms of diabetes, desired ranges of glucose level fasting, after meals and with exercise. Acknowledge that pre and post exercise glucose checks will be done for 3 sessions at entry of program.   Cardiac Rehab from 11/26/2018 in [facility] Cardiac and Pulmonary Rehab  Date  09/22/82  Educator  [facility]  Instruction Review Code  1- Verbalizes Understanding      Know Your Numbers and Risk Factors: -Group verbal and written instruction about important numbers in your health.  Discussion of what are risk factors and how they play a role in the disease process.  Review of Cholesterol, Blood Pressure, Diabetes, and BMI and the role they play in your overall health.   Cardiac Rehab from 11/26/2018 in [facility] Cardiac and Pulmonary Rehab  Date  11/12/18  Educator  [facility]  Instruction Review Code  1- Verbalizes Understanding      Sleep Hygiene: -Provides group verbal and written instruction about how sleep can affect your health.  Define sleep hygiene, discuss sleep cycles and impact of sleep habits. Review good sleep hygiene tips.    Cardiac Rehab from 11/26/2018 in [facility] Cardiac and Pulmonary Rehab  Date  11/19/18  Educator  Lucianne Lei, MSW  Instruction Review Code  1- Verbalizes Understanding      Other: -Provides group and verbal instruction on various topics (see comments)   Knowledge Questionnaire Score: Knowledge Questionnaire Score - 09/22/18 1306      Knowledge Questionnaire Score   Pre Score  21/25   correct answers reviewed with Yaniv. Focus on anatomy, nutrition, exercise      Core Components/Risk Factors/Patient Goals at Admission: Personal Goals and Risk Factors at Admission - 09/22/18 1305      Core Components/Risk  Factors/Patient Goals on Admission    Weight Management  Yes;Weight Maintenance    Intervention  Weight Management: Develop a combined nutrition and exercise program designed to reach desired caloric intake, while maintaining appropriate intake of nutrient and fiber, sodium and fats, and appropriate energy expenditure required for the weight goal.;Weight Management: Provide education and appropriate resources to help participant work on and attain dietary goals.    Admit Weight  185 lb (83.9 kg)    Expected Outcomes  Short Term: Continue to assess and modify interventions until short term weight is achieved;Long Term: Adherence to nutrition and physical activity/exercise program aimed toward attainment of established weight goal;Weight Maintenance: Understanding of the daily nutrition guidelines, which includes 25-35% calories from fat, 7% or less cal from saturated fats, less than 220m cholesterol, less than 1.5gm of sodium, &  5 or more servings of fruits and vegetables daily;Understanding recommendations for meals to include 15-35% energy as protein, 25-35% energy from fat, 35-60% energy from carbohydrates, less than 263m of dietary cholesterol, 20-35 gm of total fiber daily;Understanding of distribution of calorie intake throughout the day with the consumption of 4-5 meals/snacks    Diabetes  Yes    Intervention  Provide education about signs/symptoms and action to take for hypo/hyperglycemia.;Provide education about proper nutrition, including hydration, and aerobic/resistive exercise prescription along with prescribed medications to achieve blood glucose in normal ranges: Fasting glucose 65-99 mg/dL    Expected Outcomes  Short Term: Participant verbalizes understanding of the signs/symptoms and immediate care of hyper/hypoglycemia, proper foot care and importance of medication, aerobic/resistive exercise and nutrition plan for blood glucose control.;Long Term: Attainment of HbA1C < 7%.    Lipids  Yes     Intervention  Provide education and support for participant on nutrition & aerobic/resistive exercise along with prescribed medications to achieve LDL <73m HDL >4025m   Expected Outcomes  Short Term: Participant states understanding of desired cholesterol values and is compliant with medications prescribed. Participant is following exercise prescription and nutrition guidelines.;Long Term: Cholesterol controlled with medications as prescribed, with individualized exercise RX and with personalized nutrition plan. Value goals: LDL < 72m4mDL > 40 mg.       Core Components/Risk Factors/Patient Goals Review:  Goals and Risk Factor Review    Row Name 10/23/18 1640 11/24/18 1623           Core Components/Risk Factors/Patient Goals Review   Personal Goals Review  Weight Management/Obesity;Improve shortness of breath with ADL's;Diabetes;Hypertension;Lipids  Weight Management/Obesity;Improve shortness of breath with ADL's;Diabetes;Hypertension;Lipids      Review  Agustine is taking meds as directed.  He has a BP cuff at home but doesnt use it.  His BG at home has been 130-140 ( he admits sneaking chocolate to eat)  He has noticed he can carry full load of laundry and groceries easier than before starting HT.  Shan is taking all meds as directed.  He will start checking BP at [facility] at least on [facility] is still running around 140.  Devan had A1c 3 months ago and his Dr was satisfied with it.  He still admits eating chocolate and sweets"more than I should."        Expected Outcomes  Short - check BP at home once a day Long - monitor BP, BG at home as well as follow Dr advice to achieve optimal level  Short - [facility] not [facility] - He plans to join a gym wih his wife after graduation         Core Components/Risk Factors/Patient Goals at Discharge (Final Review):  Goals and Risk Factor Review - 11/24/18 1623      Core Components/Risk Factors/Patient Goals Review   Personal Goals  Review  Weight Management/Obesity;Improve shortness of breath with ADL's;Diabetes;Hypertension;Lipids    Review  Dewaine is taking all meds as directed.  He will start checking BP at [facility] at least [facility] is still running around 140.  Mujahid had A1c 3 months ago and his Dr was satisfied with it.  He still admits eating chocolate and sweets"more than I should."      Expected Outcomes  Short - [facility] - He plans to join a gym wih his wife after graduation  ITP Comments: ITP Comments    Row Name 09/22/18 1301 10/06/18 1649 10/14/18 0641 11/12/18 1400     ITP Comments  Med Review completed. Initial ITP created. Diagnosis can be found in [facility] 8/19  Daunte reports his PM has changed to on demand at 70 bpm  30 Day Review. Continue with ITP unless directed changes per Medical Director review  30 day review completed. ITP sent to Dr. Emily Filbert, Medical Director of Cardiac Rehab. Continue with ITP unless changes are made by physician.       Comments:

## 2018-12-02 ENCOUNTER — Inpatient Hospital Stay: Payer: Self-pay

## 2018-12-02 ENCOUNTER — Other Ambulatory Visit: Payer: Self-pay | Admitting: Internal Medicine

## 2018-12-02 DIAGNOSIS — H6121 Impacted cerumen, right ear: Secondary | ICD-10-CM | POA: Diagnosis not present

## 2018-12-02 LAB — CBC
HCT: 38.2 % — ABNORMAL LOW (ref 39.0–52.0)
Hemoglobin: 12.5 g/dL — ABNORMAL LOW (ref 13.0–17.0)
MCH: 30.1 pg (ref 26.0–34.0)
MCHC: 32.7 g/dL (ref 30.0–36.0)
MCV: 92 fL (ref 80.0–100.0)
Platelets: 228 10*3/uL (ref 150–400)
RBC: 4.15 MIL/uL — ABNORMAL LOW (ref 4.22–5.81)
RDW: 14 % (ref 11.5–15.5)
WBC: 8.8 10*3/uL (ref 4.0–10.5)
nRBC: 0 % (ref 0.0–0.2)

## 2018-12-02 LAB — GLUCOSE, CAPILLARY
GLUCOSE-CAPILLARY: 146 mg/dL — AB (ref 70–99)
Glucose-Capillary: 129 mg/dL — ABNORMAL HIGH (ref 70–99)

## 2018-12-02 LAB — PROTIME-INR
INR: 2.22
Prothrombin Time: 24.3 seconds — ABNORMAL HIGH (ref 11.4–15.2)

## 2018-12-02 LAB — CALCIUM, IONIZED: Calcium, Ionized, Serum: 5 mg/dL (ref 4.5–5.6)

## 2018-12-02 MED ORDER — CARBAMIDE PEROXIDE 6.5 % OT SOLN: 5.0000 [drp] | Freq: Two times a day (BID) | OTIC | 0 refills | Status: DC

## 2018-12-02 MED ORDER — WARFARIN SODIUM 5 MG PO TABS
5.0000 mg | ORAL_TABLET | Freq: Once | ORAL | Status: DC
  Filled 2018-12-02: qty 1

## 2018-12-02 NOTE — Progress Notes (Signed)
[name] [name]   DOB:1933-12-08   ZO#:[id number]    Subjective: Patient had a good night sleep.  Denies any pain.  Dizziness improved.  No fever chills no diarrhea.  Objective:  Vitals:   12/01/18 2041 12/02/18 0356  BP: 133/62 131/62  Pulse: 79 72  Resp:  16  Temp:  98.1 F (36.7 C)  SpO2:  98%     Intake/Output Summary (Last 24 hours) at 12/02/2018 9811 Last data filed at 12/02/2018 0400 Gross per 24 hour  Intake 480 ml  Output 650 ml  Net -170 ml    Physical Exam  Constitutional: He is oriented to person, place, and time and well-developed, well-nourished, and in no distress.  HENT:  Head: Normocephalic and atraumatic.  Mouth/Throat: Oropharynx is clear and moist. No oropharyngeal exudate.  Eyes: Pupils are equal, round, and reactive to light.  Neck: Normal range of motion. Neck supple.  Cardiovascular: Normal rate and regular rhythm.  Pulmonary/Chest: No respiratory distress. He has no wheezes.  Abdominal: Soft. Bowel sounds are normal. He exhibits no distension and no mass. There is no abdominal tenderness. There is no rebound and no guarding.  Musculoskeletal: Normal range of motion.        General: No tenderness or edema.  Neurological: He is alert and oriented to person, place, and time.  Skin: Skin is warm.  Psychiatric: Affect normal.     Labs:  Lab Results  Component Value Date   WBC 8.8 12/02/2018   HGB 12.5 (L) 12/02/2018   HCT 38.2 (L) 12/02/2018   MCV 92.0 12/02/2018   PLT 228 12/02/2018   NEUTROABS 5.8 12/01/2018    Lab Results  Component Value Date   NA 137 12/01/2018   K 3.7 12/01/2018   CL 102 12/01/2018   CO2 26 12/01/2018    Studies:  Dg Chest Port 1 View  Result Date: 12/01/2018 CLINICAL DATA:  Shortness of breath with lightheadedness EXAM: PORTABLE CHEST 1 VIEW COMPARISON:  10/29/2017 FINDINGS: Interval pacer from the left with leads at the right ventricle. Interval mitral valve replacement. Low volume chest. There is no edema,  consolidation, effusion, or pneumothorax. Normal heart size when accounting for low volumes. IMPRESSION: Low volume chest without acute finding. Electronically Signed   By: Monte Fantasia M.D.   On: 12/01/2018 07:13    Mediastinal lymphadenopathy 85 year old male patient with multiple medical problems including CAD/mechanical valve on anticoagulation with Coumadin; history of kidney cancer; currently admitted to hospital for continued dizziness/incidentally noted to have mediastinal adenopathy/kidney mass  # CT Feb 2020- [OSH]Mediastinal adenopathy/subcarinal lymph node-approximately 2.5 to 3 cm/also-supraclavicular lymphadenopathy;also [sub-cm RML/LUL/LLL ~ 0.4-0.8 cm]-the etiology is unclear.  Bilateral lung nodules [up to 0.4cm in size]; up to 1.5-1.7cm mediastinal adenopathy present on previous CT scan in dec 2018.  However on the most recent CT scan/OSH although the mediastinal LN slightly larger. Will need PET scan/ biopsy as deemed appropriate.    # ~2.2 cm calcified lesion noted in the left kidney; ? 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  Stable will reach out to Dr. Velva Harman regarding bridging with Lovenox if /when planning for biopsy.  #Dizziness/vertigo-unclear etiology status post CT head/outside hospital-no acute process.  Improved.  Plan: I have hand-delivered the CT scan from outside hospital to [facility] [[email or web address]]; to have images uploaded.  We will also review at the tumor conference.  Patient could  be discharged from hospital from oncology standpoint.  We will make follow-up appointments  In 1-2 weeks.    Cammie Sickle, MD 12/02/2018  8:34 AM

## 2018-12-02 NOTE — Progress Notes (Signed)
ANTICOAGULATION CONSULT NOTE - Initial Consult  Pharmacy Consult for warfarin dosing Indication: chronic afib s/p mechanical aortic and mitral valve replacement @ [facility] 2019  Allergies  Allergen Reactions  . Macrolides And Ketolides Other (See Comments)  . Mycinettes   . Nitrofuran Derivatives Other (See Comments)  . Nitrofurantoin Other (See Comments)  . Erythromycin Itching and Rash    Other reaction(s): UNKNOWN  And red skin  . Zofran [Ondansetron Hcl] Rash    Patient Measurements: Height: 5\' 9"  (175.3 cm) Weight: 184 lb 11.9 oz (83.8 kg) IBW/kg (Calculated) : 70.7   Vital Signs: Temp: 98.1 F (36.7 C) (02/18 0356) BP: 131/62 (02/18 0356) Pulse Rate: 72 (02/18 0356)  Labs: Recent Labs    12/01/18 0045 12/01/18 0129 12/02/18 0304  HGB 12.1*  --  12.5*  HCT 37.0*  --  38.2*  PLT 192  --  228  APTT  --  53*  --   LABPROT  --  24.9* 24.3*  INR  --  2.29 2.22  CREATININE 1.06  --   --   CKTOTAL 55  --   --   TROPONINI 0.03*  --   --     Estimated Creatinine Clearance: 52.8 mL/min (by C-G formula based on SCr of 1.06 mg/dL).   Medical History: Past Medical History:  Diagnosis Date  . Atherosclerosis of aorta ([place])    by CT scan in past  . Atrial fibrillation ([place])    and aflutter. pt has a left atrial circuit that is not ablated. was on amiodarone-stopped, now use rate control.   . Bladder cancer ([place])    hx; treated with BCG in past   . Carotid artery disease ([place])    There was calcified plaque but no stenosis by carotid artery screening done at [facility] October, 2013  . CHB (complete heart block) ([place])    a. 05/2018 s/p BSX VVIR PPM ([facility]) in setting of bradycardia following myomectomy and AVR/MVR.  . Diabetes mellitus    type not specified  . Diabetic neuropathy (HCC)    feet  . Elevated liver enzymes    over time; hx  . Excessive sweating   . Glaucoma   . Gout   . Head injury    when slipped on ice 2004-2005. stabilized and back  on Coumadin   . Headache    migraines - distant past  . History of cardiac cath    a. 05/2018 Cath ([facility]): Non-obstructive CAD. Mildly elevated filling pressures w/ nl CI.  [name] HOCM (hypertrophic obstructive cardiomyopathy) ([place])    a. 01/2016 Echo: EF 65-70%, no rwma, LVOT gradient of 80-67mmHg, mod AS, SAM; b. 11/2017 Echo: EF 55-60%, near cavity obliteration in systole, mod AS, mild MS; c. 04/2018 Card MRI ([facility]): Sev LVH, EF >70%, Sev AS, LVOT obs w/ Sev MR, mild to mod TR/PR, mid-myocardial HE in basal-mid anteroseptum and inferoseptum; d. 05/2018 s/p Septal myomectomy; e. 05/2018 Echo: Nl EF w/ sev LVH. Mech AoV and MV.  . Homocystinemia ([place])    elevated, mild   . Hypercholesterolemia    treated.   . Hypertension   . Kidney mass    laproscopic surgery woth cryoablation of a mass outside kidney followed at [facility].   . Moderate aortic stenosis    a. 01/2016 echo: mod AS.   [name] Motion sickness    ocean boats  . Nausea   . Obstructive sleep apnea    CPAP started successfully 2014  . Orthostatic hypotension  a. orthostatic. dehydration. hospitalized 11/11  . Sleep apnea    Significant obstructive sleep apnea diagnosed in August, 2012, the patient is to receive CPAP   . Stroke [facility])    "2 old strokes" CT and MRI. [facility] 11/11. diagnosis was dehydration, no acutal reports.   . TSH elevation    on synthroid historically   . Unsteady gait    August, 2012    Medications:  Scheduled:  . aspirin EC  81 mg Oral Daily  . carbamide peroxide  5 drop Right EAR BID  . insulin aspart  0-15 Units Subcutaneous TID WC  . insulin aspart  0-5 Units Subcutaneous QHS  . latanoprost  1 drop Both Eyes QHS  . levothyroxine  50 mcg Oral Daily  . metFORMIN  1,000 mg Oral Q breakfast  . metoprolol tartrate  12.5 mg Oral BID  . pantoprazole  40 mg Oral Daily  . simvastatin  20 mg Oral q1800  . warfarin  4.5 mg Oral Once per day on Sun Mon Tue Wed Thu Sat   And  . [START ON 12/05/2018] warfarin  6  mg Oral Q Fri-1800  . Warfarin - Pharmacist Dosing Inpatient   Does not apply q1800    Assessment: Patient directly admitted from home for dizziness. Is seen at [facility], but considering wait list is long and symptoms are non-specific, came to [facility]. Patient has a h/o of chronic afib and is s/p aortic/mitral mechanical valve replacement at [facility] and is anticoagulation on warfarin PTA.  Home dose: Warfarin 4.5 mg Mon-Tues-Wed-Thurs-Sat-Sun Warfarin 6 mg Fri  02/16 @ 0130 INR 2.29 slightly subtherapeutic from goal; patient was supratherapeutic 02/03 @ 3.7 at the anti-coag visit  DATE INR DOSE 2/17  2.29 4.5 mg 2/18 2.22  Goal of Therapy:  INR goal 2.5 - 3.5 Monitor platelets by anticoagulation protocol: Yes   Plan:  2/18 INR: 2.22. INR slightly subtherapeutic.  Will order Warfarin 5mg  x 1 dose tonight.   Will monitor daily CBC's and adjust per INR trends.  Patient not on any abx or medication aside from simvastatin that interacts with warfarin.  Pernell Dupre, PharmD, BCPS Clinical Pharmacist 12/02/2018 9:10 AM

## 2018-12-02 NOTE — Care Management Note (Signed)
Case Management Note  Patient Details  Name: ROSSIE BRETADO MRN: [id number] Date of Birth: 1934-07-06  Subjective/Objective:                  Met with the patient and family to discuss plan and needs Has no needs for DME Has been provided [facility] list per CMS.gov and has chosen [employer] since he has had them in the past Recently graduated from the cardiac rehab program Patient has appointment set up for ENT outpatient   Action/Plan: [facility] list provided to the patient [facility] chosen by the patient for [facility] services No DME needs [place] with [facility] notified of Patient choice  Expected Discharge Date:  12/02/18               Expected Discharge Plan:     In-House Referral:     Discharge planning Services  CM Consult  Post Acute Care Choice:  Home Health Choice offered to:     DME Arranged:    DME Agency:     HH Arranged:  PT HH Agency:  [facility]  Status of Service:  In process, will continue to follow  If discussed at Long Length of Stay Meetings, dates discussed:    Additional Comments:  Su Hilt, RN 12/02/2018, 2:32 PM

## 2018-12-02 NOTE — Evaluation (Signed)
Physical Therapy Evaluation Patient Details Name: [name] [name] MRN: [id number] DOB: Tavon 07, 1935 Today's Date: 12/02/2018   History of Present Illness  presented to ER (at outside facility) secondary to generalized weakness, lightheadedness and dizziness; direct admit from outside facility due to finding (and further work up required) for newly noted pulmonary nodules.  Clinical Impression  Upon evaluation, patient alert and oriented; eager for OOB attempts.  Denies pain and reports only mild, intermittent dizziness at this point.  Bilat UE/LE strength, ROM, sensation grossly symmetrical and WFL; mild ataxia/dysmetria noted L UE/LE with RAMPs and finger to nose testing.  Negative clonus, negative babinski bilat.  Currently requiring min assist for bed mobility; min/mod assist for sit/stand and basic transfers with RW; mod assist (40') without assist device and min assist (100') with RW for gait efforts.  Generally poor balance with increased sway in all planes; significant LOB with head turns and dynamic gait components, requiring external assist to prevent fall. Unsafe to attempt mobility without RW and +1 at all times. Brief Vestibular screening exam-visual tracking, VOR/VOR cancellation testing, HIIINTS testing.  Mild reports of dizziness with head shaking, but quickly resolves (does tolerate limited range and limited cadence).  Otherwise, no significant findings.  Vitals stable and WFL; no orthostasis (see vitals flowsheet for details).  Patient/wife very interested in having R ear checked by ENT, reporting history of significant wax build up in the past (wondering if this could be causing dizziness); attending informed/aware.  If symptoms persist, may consider additional vestibular (dix-hallpike) testing as appropriate. Would benefit from skilled PT to address above deficits and promote optimal return to PLOF; recommend transition to STR upon discharge from acute hospitalization.   Of note,  patient wife very frustrated with current situation; provided active listening and support, relayed concerns to primary care team for follow up as appropriate.       Follow Up Recommendations SNF    Equipment Recommendations       Recommendations for Other Services       Precautions / Restrictions Precautions Precautions: Fall;ICD/Pacemaker Restrictions Weight Bearing Restrictions: No      Mobility  Bed Mobility Overal bed mobility: Needs Assistance Bed Mobility: Supine to Sit     Supine to sit: Min assist        Transfers Overall transfer level: Needs assistance   Transfers: Sit to/from Stand Sit to Stand: Min assist;Mod assist         General transfer comment: increased sway in A/P plane, manual assist to prevent LOB with transition to upright  Ambulation/Gait Ambulation/Gait assistance: Mod assist Gait Distance (Feet): 40 Feet Assistive device: None       General Gait Details: very guarded posture with limited/no trunk rotation and arm swing; choppy, ataxic stepping pattern and overall foot placement.  Poor balance responses; significant LOB (requiring mod assist to correct) with head turns or any dynamic gait components  Stairs            Wheelchair Mobility    Modified Rankin (Stroke Patients Only)       Balance Overall balance assessment: Needs assistance Sitting-balance support: No upper extremity supported;Feet supported Sitting balance-Leahy Scale: Fair     Standing balance support: Bilateral upper extremity supported Standing balance-Leahy Scale: Poor                               Pertinent Vitals/Pain Pain Assessment: No/denies pain    Home Living Family/patient expects to  be discharged to:: Private residence Living Arrangements: Spouse/significant other Available Help at Discharge: Family Type of Home: House Home Access: Stairs to enter Entrance Stairs-Rails: Right Entrance Stairs-Number of Steps: [place]: Two level;Able to live on main level with bedroom/bathroom        Prior Function Level of Independence: Independent with assistive device(s)         Comments: Mod indep with SPC for ADLs, household and community mobilization; recent cardiac surgery (fall 2019) with active enrollment in cardiac rehab program just prior to this admission     Hand Dominance        Extremity/Trunk Assessment   Upper Extremity Assessment Upper Extremity Assessment: Overall WFL for tasks assessed(mild ataxia/dysmetria L UE; symmetrical strength and sensation)    Lower Extremity Assessment Lower Extremity Assessment: Overall WFL for tasks assessed(mild ataxia L > R LE; symmetrical and functional strength and sensation.  No focal weakness.  Negative clonus, negative babinski)       Communication   Communication: No difficulties  Cognition Arousal/Alertness: Awake/alert Behavior During Therapy: WFL for tasks assessed/performed Overall Cognitive Status: Within Functional Limits for tasks assessed                                        General Comments      Exercises Other Exercises Other Exercises: Vestibular screening exam-visual tracking, VOR/VOR cancellation testing, HIIINTS testing.  Mild reports of dizziness with head shaking, but quickly resolves (does tolerate limited range and limited cadence).  Otherwise, no significant findings Other Exercises: 100' with RW, min/mod assist-remains generally guarded with limited ability to tolerate/participate with head turns or dynamic gait components.  Inconsistent limb advancement and foot placement, poor balance reactions/self-righting.  Unsafe to attempt without RW and +1; patient voices understanding and agreement.   Assessment/Plan    PT Assessment Patient needs continued PT services  PT Problem List Decreased strength;Decreased activity tolerance;Decreased balance;Decreased mobility;Decreased knowledge of use of  DME;Decreased safety awareness;Cardiopulmonary status limiting activity;Decreased knowledge of precautions;Decreased coordination       PT Treatment Interventions DME instruction;Gait training;Stair training;Functional mobility training;Therapeutic activities;Therapeutic exercise;Balance training;Neuromuscular re-education;Patient/family education    PT Goals (Current goals can be found in the Care Plan section)  Acute Rehab PT Goals Patient Stated Goal: to have the ENT check my (R) ear PT Goal Formulation: With patient/family Time For Goal Achievement: 12/16/18 Potential to Achieve Goals: Good    Frequency Min 2X/week   Barriers to discharge        Co-evaluation               AM-PAC PT "6 Clicks" Mobility  Outcome Measure Help needed turning from your back to your side while in a flat bed without using bedrails?: A Little Help needed moving from lying on your back to sitting on the side of a flat bed without using bedrails?: A Little Help needed moving to and from a bed to a chair (including a wheelchair)?: A Little Help needed standing up from a chair using your arms (e.g., wheelchair or bedside chair)?: A Lot Help needed to walk in hospital room?: A Lot Help needed climbing 3-5 steps with a railing? : A Lot 6 Click Score: 15    End of Session Equipment Utilized During Treatment: Gait belt Activity Tolerance: Patient tolerated treatment well Patient left: in chair;with call bell/phone within reach;with chair alarm set;with family/visitor present Nurse Communication: Mobility  status PT Visit Diagnosis: Muscle weakness (generalized) (M62.81);Difficulty in walking, not elsewhere classified (R26.2)    Time: 8301-4159 PT Time Calculation (min) (ACUTE ONLY): 65 min   Charges:   PT Evaluation $PT Eval Moderate Complexity: 1 Mod PT Treatments $Gait Training: 8-22 mins $Therapeutic Activity: 23-37 mins        Harmonie Verrastro H. Owens Shark, PT, DPT, NCS 12/02/18, 2:23  PM [phone]

## 2018-12-02 NOTE — Discharge Summary (Signed)
[place] [facility] [place] NAME: [name] [name]    MR#:  [id number]  DATE OF BIRTH:  27-Mar-1934  DATE OF ADMISSION:  11/30/2018 ADMITTING PHYSICIAN: [name] Harms, MD  DATE OF DISCHARGE: 12/02/2018  PRIMARY CARE PHYSICIAN: [name] Banana., MD    ADMISSION DIAGNOSIS:  vertigo  DISCHARGE DIAGNOSIS:  Dizziness Abnormal CT chest with pulmonary nodules and Lymphadenopathy--w/u by [name] [name] as out pt  SECONDARY DIAGNOSIS:   Past Medical History:  Diagnosis Date  . Atherosclerosis of aorta ([place])    by CT scan in past  . Atrial fibrillation ([place])    and aflutter. pt has a left atrial circuit that is not ablated. was on amiodarone-stopped, now use rate control.   . Bladder cancer ([place])    hx; treated with BCG in past   . Carotid artery disease ([place])    There was calcified plaque but no stenosis by carotid artery screening done at [facility] October, 2013  . CHB (complete heart block) ([place])    a. 05/2018 s/p BSX VVIR PPM ([facility]) in setting of bradycardia following myomectomy and AVR/MVR.  . Diabetes mellitus    type not specified  . Diabetic neuropathy (HCC)    feet  . Elevated liver enzymes    over time; hx  . Excessive sweating   . Glaucoma   . Gout   . Head injury    when slipped on ice 2004-2005. stabilized and back on Coumadin   . Headache    migraines - distant past  . History of cardiac cath    a. 05/2018 Cath ([facility]): Non-obstructive CAD. Mildly elevated filling pressures w/ nl CI.  [name] HOCM (hypertrophic obstructive cardiomyopathy) ([place])    a. 01/2016 Echo: EF 65-70%, no rwma, LVOT gradient of 80-35mmHg, mod AS, SAM; [name]. 11/2017 Echo: EF 55-60%, near cavity obliteration in systole, mod AS, mild MS; c. 04/2018 Card MRI ([facility]): Sev LVH, EF >70%, Sev AS, LVOT obs w/ Sev MR, mild to mod TR/PR, mid-myocardial HE in basal-mid anteroseptum and inferoseptum; d. 05/2018 s/p Septal myomectomy; e. 05/2018 Echo: Nl EF w/ sev  LVH. Mech AoV and MV.  . Homocystinemia ([place])    elevated, mild   . Hypercholesterolemia    treated.   . Hypertension   . Kidney mass    laproscopic surgery woth cryoablation of a mass outside kidney followed at [facility].   . Moderate aortic stenosis    a. 01/2016 echo: mod AS.   [name] Motion sickness    ocean boats  . Nausea   . Obstructive sleep apnea    CPAP started successfully 2014  . Orthostatic hypotension    a. orthostatic. dehydration. hospitalized 11/11  . Sleep apnea    Significant obstructive sleep apnea diagnosed in August, 2012, the patient is to receive CPAP   . Stroke [facility])    "2 old strokes" CT and MRI. [place] [facility] 11/11. diagnosis was dehydration, no acutal reports.   . TSH elevation    on synthroid historically   . Unsteady gait    August, 2012    HOSPITAL COURSE:   [name] [name]  is a 85 y.o. male with a litany of chronic medical issues, including but not limited to T2NIDDM, HTN, HLD, Afib + mechanical AVR + mechanical MVR (Coumadin), Complete Heart Block (s/p PPM), Hx bladder Ca/R renal mass p/w pulmonary nodules, now presenting for direct admission  1. Dizziness/weakness came in after patient was on vacation in  [place] with episode of dizziness for which he was seen in the emergency room and family brought her here from [facility] for further evaluation -patient feels he has right-sided ear wax which needs to be removed. -I did take a look with the outer scope he does have significant amount of earwax. Per patient's and wife request I did try to remove with the Eat wick however patient was feeling uncomfortable and I could not complete the process. -Patient's wife wants to take him to [name] [name] to get the ear wax removed. -CT had done at the outlying hospital was essentially negative for stroke. Old cerebellar stroke was noted. Patient has pacemaker unable to do MRI. -He is hemodynamically stable. -Seen by physical therapy. Patient has  some unsteadiness on his gait. Recommends rehab. However patient and wife want to go home. Home health has been set up. -Patient remains in sinus rhythm. No chest pain.  2. Abnormal CT chest with mediastinal adenopathy/sub carina lymph node/super clavicular lymphadenopathy. -Patient will need outpatient PET scan/possible biopsy. This will be done as outpatient. -patient seen by [name] [name].  3. Approximate 2 cm mass noted on the right kidney. Questionable malignancy. Patient had left kidney renal cell carcinoma status post cryotherapy  4. History of coronary artery disease/HO CM/artificial mechanical valve on Coumadin. Follows up with [name]. Fletcher [name]. -Patient's Coumadin was continued. -INR therapeutic.  Patient has multiple medical problems. His home meds were continued. He will discharge to home with home health. Discharge plan was again discussed at length with patient's wife. CONSULTS OBTAINED:  Treatment Team:  [name] Silence, MD  DRUG ALLERGIES:   Allergies  Allergen Reactions  . Macrolides And Ketolides Other (See Comments)  . Mycinettes   . Nitrofuran Derivatives Other (See Comments)  . Nitrofurantoin Other (See Comments)  . Erythromycin Itching and Rash    Other reaction(s): UNKNOWN  And red skin  . Zofran [Ondansetron Hcl] Rash    DISCHARGE MEDICATIONS:   Allergies as of 12/02/2018      Reactions   Macrolides And Ketolides Other (See Comments)   Mycinettes    Nitrofuran Derivatives Other (See Comments)   Nitrofurantoin Other (See Comments)   Erythromycin Itching, Rash   Other reaction(s): UNKNOWN  And red skin   Zofran [ondansetron Hcl] Rash      Medication List    STOP taking these medications   amoxicillin 500 MG capsule Commonly known as:  AMOXIL     TAKE these medications   acetaminophen 325 MG tablet Commonly known as:  TYLENOL Take 650 mg by mouth every 6 (six) hours as needed.   aspirin EC 81 MG tablet Take 1 tablet (81 mg total) by mouth  daily.   carbamide peroxide 6.5 % OTIC solution Commonly known as:  DEBROX Place 5 drops into the right ear 2 (two) times daily.   glucose blood test strip Commonly known as:  ONE TOUCH ULTRA TEST USE ONE STRIP TO CHECK GLUCOSE ONCE DAILY   ONE TOUCH ULTRA TEST test strip Generic drug:  glucose blood USE TO TEST BLOOD SUGAR ONCE DAILY   latanoprost 0.005 % ophthalmic solution Commonly known as:  XALATAN Place 1 drop into both eyes at bedtime.   levothyroxine 50 MCG tablet Commonly known as:  SYNTHROID, LEVOTHROID TAKE 1 TABLET BY MOUTH ONCE DAILY   metFORMIN 1000 MG tablet Commonly known as:  GLUCOPHAGE Take 1 tablet (1,000 mg total) by mouth daily with breakfast.   metoprolol tartrate 25 MG tablet Commonly known as:  LOPRESSOR Take 12.5 mg by mouth 2 (two) times daily.   ONETOUCH DELICA LANCETS 48N Misc USE ONE LANCET TO CHECK BLOOD GLUCOSE ONCE DAILY   ONETOUCH DELICA LANCETS 46E Misc USE TO TEST BLOOD SUGAR ONCE DAILY   pantoprazole 40 MG tablet Commonly known as:  PROTONIX Take 1 tablet (40 mg total) by mouth 2 (two) times daily. What changed:  when to take this   simvastatin 40 MG tablet Commonly known as:  ZOCOR TAKE 1/2 (ONE-HALF) TABLET BY MOUTH AT BEDTIME   warfarin 3 MG tablet Commonly known as:  COUMADIN Take as directed. If you are unsure how to take this medication, talk to your nurse or doctor. Original instructions:  Take 1.5 to 2 tablets daily as directed by Coumadin clinic What changed:  additional instructions       If you experience worsening of your admission symptoms, develop shortness of breath, life threatening emergency, suicidal or homicidal thoughts you must seek medical attention immediately by calling 911 or calling your MD immediately  if symptoms less severe.  You Must read complete instructions/literature along with all the possible adverse reactions/side effects for all the Medicines you take and that have been prescribed to you.  Take any new Medicines after you have completely understood and accept all the possible adverse reactions/side effects.   Please note  You were cared for by a hospitalist during your hospital stay. If you have any questions about your discharge medications or the care you received while you were in the hospital after you are discharged, you can call the unit and asked to speak with the hospitalist on call if the hospitalist that took care of you is not available. Once you are discharged, your primary care physician will handle any further medical issues. Please note that NO REFILLS for any discharge medications will be authorized once you are discharged, as it is imperative that you return to your primary care physician (or establish a relationship with a primary care physician if you do not have one) for your aftercare needs so that they can reassess your need for medications and monitor your lab values. Today   SUBJECTIVE   Difficulty hearing with right ear due to wax  VITAL SIGNS:  Blood pressure 122/63, pulse 70, temperature 98.4 F (36.9 C), resp. rate 20, height 5\' 9"  (1.753 m), weight 83.8 kg, SpO2 93 %.  I/O:    Intake/Output Summary (Last 24 hours) at 12/02/2018 1427 Last data filed at 12/02/2018 0915 Gross per 24 hour  Intake 240 ml  Output 625 ml  Net -385 ml    PHYSICAL EXAMINATION:  GENERAL:  85 y.o.-year-old patient lying in the bed with no acute distress.  EYES: Pupils equal, round, reactive to light and accommodation. No scleral icterus. Extraocular muscles intact.  HEENT: Head atraumatic, normocephalic. Oropharynx and nasopharynx clear. Right ear wax++ NECK:  Supple, no jugular venous distention. No thyroid enlargement, no tenderness.  LUNGS: Normal breath sounds bilaterally, no wheezing, rales,rhonchi or crepitation. No use of accessory muscles of respiration.  CARDIOVASCULAR: S1, S2 normal. No murmurs, rubs, or gallops.  ABDOMEN: Soft, non-tender, non-distended.  Bowel sounds present. No organomegaly or mass.  EXTREMITIES: No pedal edema, cyanosis, or clubbing.  NEUROLOGIC: Cranial nerves II through XII are intact. Muscle strength 5/5 in all extremities. Sensation intact. Gait not checked.  PSYCHIATRIC: The patient is alert and oriented x 3.  SKIN: No obvious rash, lesion, or ulcer.   DATA REVIEW:   CBC  Recent Labs  Lab 12/02/18 0304  WBC 8.8  HGB 12.5*  HCT 38.2*  PLT 228    Chemistries  Recent Labs  Lab 12/01/18 0045  NA 137  K 3.7  CL 102  CO2 26  GLUCOSE 122*  BUN 20  CREATININE 1.06  CALCIUM 9.0  MG 2.1  AST 25  ALT 13  ALKPHOS 58  BILITOT 1.0    Microbiology Results   No results found for this or any previous visit (from the past 240 hour(s)).  RADIOLOGY:  Dg Chest Port 1 View  Result Date: 12/01/2018 CLINICAL DATA:  Shortness of breath with lightheadedness EXAM: PORTABLE CHEST 1 VIEW COMPARISON:  10/29/2017 FINDINGS: Interval pacer from the left with leads at the right ventricle. Interval mitral valve replacement. Low volume chest. There is no edema, consolidation, effusion, or pneumothorax. Normal heart size when accounting for low volumes. IMPRESSION: Low volume chest without acute finding. Electronically Signed   By: Monte Fantasia M.D.   On: 12/01/2018 07:13     CODE STATUS:     Code Status Orders  (From admission, onward)         Start     Ordered   12/01/18 0109  Full code  Continuous     12/01/18 0109        Code Status History    Date Active Date Inactive Code Status Order ID Comments User Context   06/30/2018 1422 07/05/2018 1532 Full Code [id number]  Demetrios Loll, MD ED   09/18/2017 2314 09/21/2017 1702 Full Code [id number]  Lance Coon, MD Inpatient    Advance Directive Documentation     Most Recent Value  Type of Advance Directive  Healthcare Power of [place], Living will  Pre-existing out of facility DNR order (yellow form or pink MOST form)  -  "MOST" Form in Place?  -      TOTAL  TIME TAKING CARE OF THIS PATIENT: **40* minutes.    Fritzi Mandes M.D on 12/02/2018 at 2:27 PM  Between 7am to 6pm - Pager - [phone] After 6pm go to [email or web address] - password [email or web address] [name]  Office  [phone]  CC: Primary care physician; [name] Banana., MD

## 2018-12-02 NOTE — Clinical Social Work Note (Signed)
Clinical Social Work Assessment  Patient Details  Name: [name] [name] MRN: [id number] Date of Birth: 04/20/1934  Date of referral:  12/02/18               Reason for consult:  Facility Placement                Permission sought to share information with:  Case Manager, Customer service manager, Family Supports Permission granted to share information::  Yes, Verbal Permission Granted  Name::      SNF  Agency::   [facility]   Relationship::     Contact Information:     Housing/Transportation Living arrangements for the past 2 months:  [place] of Information:  Patient Patient Interpreter Needed:  None Criminal Activity/Legal Involvement Pertinent to Current Situation/Hospitalization:  No - Comment as needed Significant Relationships:  Spouse, Adult Children Lives with:  Spouse Do you feel safe going back to the place where you live?  Yes Need for family participation in patient care:  Yes (Comment)  Care giving concerns:  Patient lives with spouse in [place] Worker assessment / plan:  CSW consulted for SNF placement. MD states that patient and wife would like patient to go to SNF. CSW met with patient and wife [name] [name] at bedside. Patient and wife talked about SNF but then decided that they would prefer to go home with home health. Wife states that they have used [facility] in the past and would like to use them again. CSW notified RNCM of above. CSW signing off. Please re consult if further needs arise,   Employment status:  Retired Nurse, adult PT Recommendations:  [place] / Referral to community resources:  [place]  Patient/Family's Response to care:  Patient and wife thanked CSW for assistance   Patient/Family's Understanding of and Emotional Response to Diagnosis, Current Treatment, and Prognosis:  Patient and wife state understanding current  treatment and plan   Emotional Assessment Appearance:  Appears stated age Attitude/Demeanor/Rapport:    Affect (typically observed):  Accepting, Pleasant Orientation:  Oriented to Self, Oriented to Place, Oriented to  Time, Oriented to Situation Alcohol / Substance use:  Not Applicable Psych involvement (Current and /or in the community):  No (Comment)  Discharge Needs  Concerns to be addressed:  Discharge Planning Concerns Readmission within the last 30 days:  No Current discharge risk:  Dependent with Mobility Barriers to Discharge:  Continued Medical Work up   [employer], [place] 12/02/2018, 2:18 PM

## 2018-12-02 NOTE — NC FL2 (Signed)
[place] LEVEL OF CARE SCREENING TOOL     IDENTIFICATION  Patient Name: [name] [name] Birthdate: 06/01/1934 Sex: male Admission Date (Current Location): 11/30/2018  [place] and [place] Number:  Engineering geologist and Address:  [facility], [address] [place], [place] [address]      Provider Number: [id number]  Attending Physician Name and Address:  Fritzi Mandes, MD  Relative Name and Phone Number:       Current Level of Care: Hospital Recommended Level of Care: [place] Prior Approval Number:    Date Approved/Denied:   PASRR Number: [id number]  Discharge Plan: SNF    Current Diagnoses: Patient Active Problem List   Diagnosis Date Noted  . Pulmonary nodules 12/01/2018  . Mediastinal lymphadenopathy 12/01/2018  . Heart block AV third degree ([place]) 10/07/2018  . Pacemaker 10/07/2018  . H/O aortic valve replacement 10/07/2018  . Blood in stool   . Bleeding duodenal ulcer   . GIB (gastrointestinal bleeding) 06/30/2018  . Acute upper GI bleed   . Symptomatic anemia   . Heart murmur, systolic 29/52/8413  . H/O: rheumatic fever 03/21/2017  . Tinnitus 10/30/2016  . On warfarin therapy 07/06/2015  . Arteriosclerosis of coronary artery 06/13/2015  . Carcinoma in situ of bladder 06/13/2015  . Diabetes mellitus, type 2 ([place]) 06/13/2015  . Diabetic neuropathy ([place]) 06/13/2015  . Dizziness 06/13/2015  . Essential (primary) hypertension 06/13/2015  . Fatigue 06/13/2015  . Glaucoma 06/13/2015  . Hypercholesteremia 06/13/2015  . Male hypogonadism 06/13/2015  . Adult hypothyroidism 06/13/2015  . Arthritis, degenerative 06/13/2015  . CAP (community acquired pneumonia) 06/13/2015  . Adenocarcinoma, renal cell ([place]) 06/13/2015  . Benign head tremor 12/13/2014  . Carotid arterial disease ([place]) 07/12/2014  . Decreased cardiac output 07/12/2014  . Cardiomyopathy, hypertrophic obstructive ([place]) 07/12/2014  . Head  injuries 07/12/2014  . HOCM (hypertrophic obstructive cardiomyopathy) ([place])   . Encounter for therapeutic drug monitoring 11/16/2013  . Obstructive sleep apnea   . Benign prostatic hypertrophy without urinary obstruction 06/09/2012  . History of neoplasm of bladder 06/09/2012  . Head injury   . Atherosclerosis of aorta ([place])   . Hypotension   . Stroke ([place])   . Unsteady gait   . Excessive sweating   . Nausea   . ORTHOSTATIC HYPOTENSION, HX OF 09/01/2010  . Overweight(278.02) 03/07/2009  . MITRAL REGURGITATION 03/05/2009  . Chronic atrial fibrillation 03/05/2009  . Atrial fibrillation and flutter ([place]) 03/05/2009  . Mitral and aortic incompetence 03/05/2009    Orientation RESPIRATION BLADDER Height & Weight     Self, Time, Place  Normal Continent Weight: 184 lb 11.9 oz (83.8 kg) Height:  5\' 9"  (175.3 cm)  BEHAVIORAL SYMPTOMS/MOOD NEUROLOGICAL BOWEL NUTRITION STATUS  (none) (none) Continent Diet(Heart Healthy/Carb Modified )  AMBULATORY STATUS COMMUNICATION OF NEEDS Skin   Extensive Assist Verbally Normal                       Personal Care Assistance Level of Assistance  Bathing, Feeding, Dressing Bathing Assistance: Limited assistance Feeding assistance: Independent Dressing Assistance: Limited assistance     Functional Limitations Info  Sight, Hearing, Speech Sight Info: Adequate Hearing Info: Adequate Speech Info: Adequate    SPECIAL CARE FACTORS FREQUENCY  PT (By licensed PT), OT (By licensed OT)     PT Frequency: 5 OT Frequency: 5            Contractures Contractures Info: Not present    Additional  Factors Info  Code Status, Allergies Code Status Info: Full Code  Allergies Info: Macrolides And Ketolides, Mycinettes, Nitrofuran Derivatives, Nitrofurantoin, Erythromycin, Zofran           Current Medications (12/02/2018):  This is the current hospital active medication list Current Facility-Administered Medications  Medication Dose Route  Frequency Provider Last Rate Last Dose  . acetaminophen (TYLENOL) tablet 650 mg  650 mg Oral Q6H PRN Arta Silence, MD   650 mg at 12/01/18 0200  . aspirin EC tablet 81 mg  81 mg Oral Daily Arta Silence, MD   81 mg at 12/02/18 0955  . bisacodyl (DULCOLAX) EC tablet 5 mg  5 mg Oral Daily PRN Arta Silence, MD      . carbamide peroxide (DEBROX) 6.5 % OTIC (EAR) solution 5 drop  5 drop Right EAR BID Fritzi Mandes, MD   5 drop at 12/02/18 0957  . diphenhydrAMINE (BENADRYL) capsule 25 mg  25 mg Oral TID PRN Fritzi Mandes, MD      . insulin aspart (novoLOG) injection 0-15 Units  0-15 Units Subcutaneous TID WC Arta Silence, MD   2 Units at 12/02/18 1218  . insulin aspart (novoLOG) injection 0-5 Units  0-5 Units Subcutaneous QHS Sridharan, Prasanna, MD      . latanoprost (XALATAN) 0.005 % ophthalmic solution 1 drop  1 drop Both Eyes QHS Arta Silence, MD   1 drop at 12/01/18 2050  . levothyroxine (SYNTHROID, LEVOTHROID) tablet 50 mcg  50 mcg Oral Daily Arta Silence, MD   50 mcg at 12/02/18 0955  . metFORMIN (GLUCOPHAGE) tablet 1,000 mg  1,000 mg Oral Q breakfast Fritzi Mandes, MD   1,000 mg at 12/02/18 0825  . metoprolol tartrate (LOPRESSOR) tablet 12.5 mg  12.5 mg Oral BID Arta Silence, MD   12.5 mg at 12/02/18 0953  . pantoprazole (PROTONIX) EC tablet 40 mg  40 mg Oral Daily Arta Silence, MD   40 mg at 12/02/18 0955  . senna-docusate (Senokot-S) tablet 1 tablet  1 tablet Oral QHS PRN Arta Silence, MD      . simvastatin (ZOCOR) tablet 20 mg  20 mg Oral q1800 Arta Silence, MD   20 mg at 12/01/18 1812  . warfarin (COUMADIN) tablet 5 mg  5 mg Oral ONCE-1800 Hallaji, Sheema M, RPH      . Warfarin - Pharmacist Dosing Inpatient   Does not apply [id number] Arta Silence, MD         Discharge Medications: Please see discharge summary for a list of discharge medications.  Relevant Imaging Results:  Relevant Lab Results:   Additional  Information SSN: [id number]  Annamaria Boots, [place]

## 2018-12-02 NOTE — Progress Notes (Signed)
hay[place]- Tumor conference.

## 2018-12-03 ENCOUNTER — Telehealth: Payer: Self-pay

## 2018-12-03 NOTE — Telephone Encounter (Signed)
HFU is scheduled for 12/09/18 @ 3:40 PM, not date listed below.   Wife expressed concerns about pts care at [facility]. She was very displeased. Wife is wanting pts cancer tx and care to take place at [facility]. Wife is wanting to know if Dr Rosanna Randy can help aid with the referral process to [facility]. She was unsure if a referral was sent or not but was told to have his records faxed there. Daughter had just went to do so. Wife states she is looking for compassion. She states she feels neither of them are getting the care they should have. Wife states she does have anxiety but does not want that used as an excuse as to why she is saying all this. Wife also noted that she had a colonoscopy 2 weeks ago and they found 8 polyps and she has to have a colon resection.

## 2018-12-03 NOTE — Telephone Encounter (Signed)
Transition Care Management Follow-up Telephone Call  Date of discharge and from where: [facility] on 12/02/18   How have you been since you were released from the hospital? Not doing well, still dizzy and off balance even with use of a walker. Pt is also having right ear pain that is described as stabbing pain intermittently. Declines fever or n/v/d. Pt gets SOB with movement.   Any questions or concerns? Yes , results of the scans.   Items Reviewed:  Did the pt receive and understand the discharge instructions provided? Yes   Medications obtained and verified? Yes   Any new allergies since your discharge? No   Dietary orders reviewed? Yes  Do you have support at home? Yes   Other (ie: DME, Home Health, etc): Jackquline Denmark is coming out to the home on Saturday for evaluation.   Functional Questionnaire: (I = Independent and D = Dependent)  Bathing/Dressing- D   Meal Prep- D  Eating- I  Maintaining continence- I  Transferring/Ambulation- Using a walker as needed.   Managing Meds- D   Follow up appointments reviewed:    PCP Hospital f/u appt confirmed? Yes  Scheduled to see Dr Rosanna Randy on 12/08/18 @ 3:00 PM.  Specialist Hospital f/u appt confirmed? Yes    Are transportation arrangements needed? No   If their condition worsens, is the pt aware to call  their PCP or go to the ED? Yes  Was the patient provided with contact information for the PCP's office or ED? Yes  Was the pt encouraged to call back with questions or concerns? Yes

## 2018-12-03 NOTE — Telephone Encounter (Signed)
HFU scheduled 12/09/18 @ 3:40 PM with Dr Rosanna Randy.

## 2018-12-03 NOTE — Telephone Encounter (Signed)
Spoke w/ pt's wife. She reports that pt was hospitalized recently for newly found metastatic cancer. He was d/c'd yesterday w/ INR of 2.2 (range 2.5-3.5). She forgot to give pt his coumadin last night. Pt's dosage is 3 mg, 1.5 tablets every day except 2 tablets on Fridays. Advised her to give pt extra 1/2 tablet tonight & tomorrow, then resume previous dosage.  Will recheck INR on Monday, 2/24.

## 2018-12-03 NOTE — Telephone Encounter (Signed)
Epic timed out. Continue the note below..  Overall, wife is not happy with [facility] or [facility]. She states they have a lot going on and feels she needs more answers.  FYI to PCP! Thanks, MM

## 2018-12-04 ENCOUNTER — Telehealth: Payer: Self-pay | Admitting: Internal Medicine

## 2018-12-04 ENCOUNTER — Other Ambulatory Visit: Payer: Self-pay | Admitting: Internal Medicine

## 2018-12-04 ENCOUNTER — Inpatient Hospital Stay: Payer: PPO | Attending: Internal Medicine

## 2018-12-04 DIAGNOSIS — H9209 Otalgia, unspecified ear: Secondary | ICD-10-CM | POA: Diagnosis not present

## 2018-12-04 DIAGNOSIS — H90A21 Sensorineural hearing loss, unilateral, right ear, with restricted hearing on the contralateral side: Secondary | ICD-10-CM | POA: Diagnosis not present

## 2018-12-04 DIAGNOSIS — C641 Malignant neoplasm of right kidney, except renal pelvis: Secondary | ICD-10-CM | POA: Insufficient documentation

## 2018-12-04 DIAGNOSIS — R42 Dizziness and giddiness: Secondary | ICD-10-CM | POA: Insufficient documentation

## 2018-12-04 DIAGNOSIS — R918 Other nonspecific abnormal finding of lung field: Secondary | ICD-10-CM | POA: Insufficient documentation

## 2018-12-04 DIAGNOSIS — R590 Localized enlarged lymph nodes: Secondary | ICD-10-CM | POA: Insufficient documentation

## 2018-12-04 DIAGNOSIS — H9120 Sudden idiopathic hearing loss, unspecified ear: Secondary | ICD-10-CM | POA: Diagnosis not present

## 2018-12-04 IMAGING — CR DG CHEST 2V
1 series · 2 of 2 positions shown · non-contrast
Comparison: 08/17/2013

CLINICAL DATA: Pt states cough,SOB and fever for 4 weeks. History
of Afib, dysrhythmia, HTN, stroke, CAD. shielded

EXAM:
CHEST  2 VIEW

[Series 1: dg chest 2 view · 0.14mm/px · 2 of 2 slices shown]
[im 1/2]
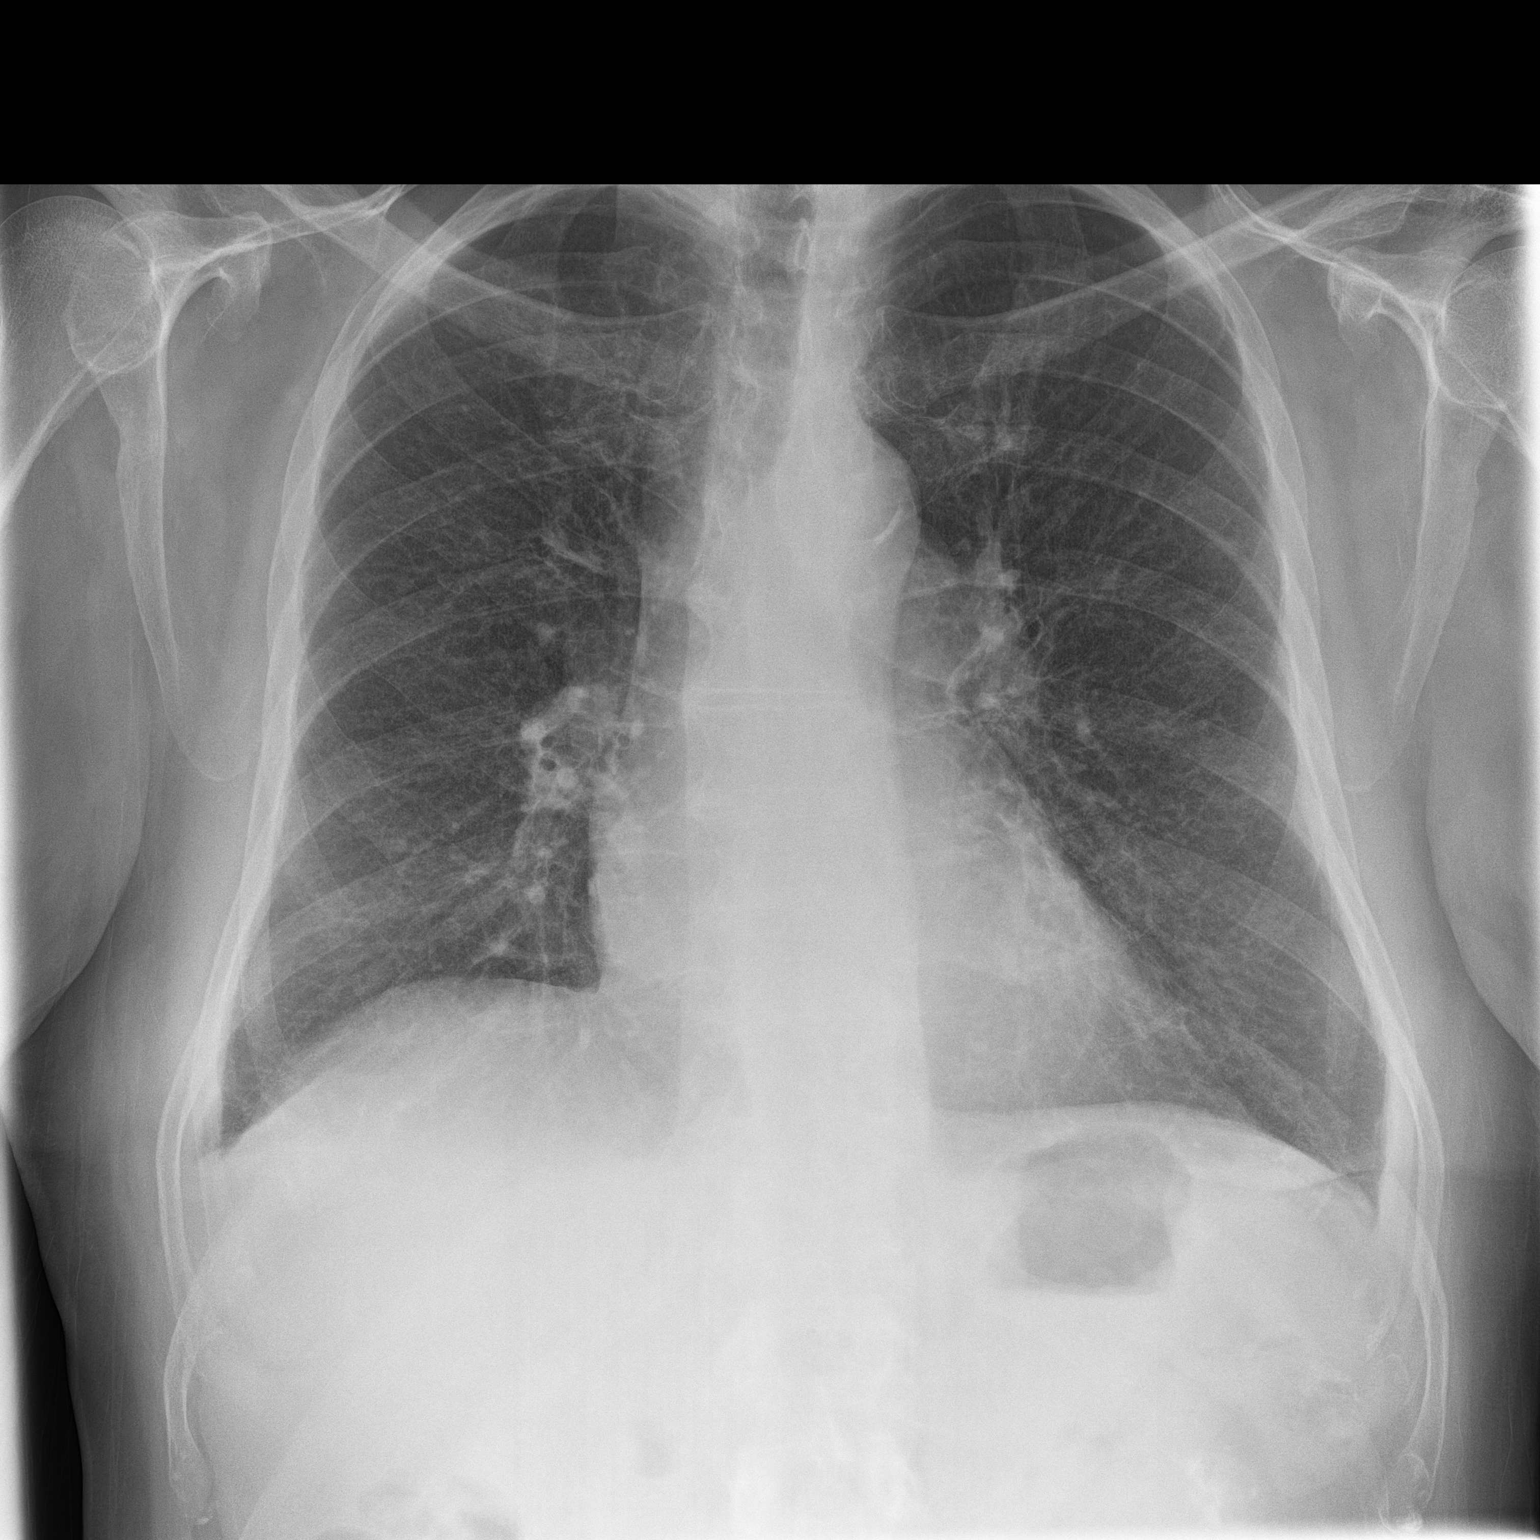
[im 2/2]
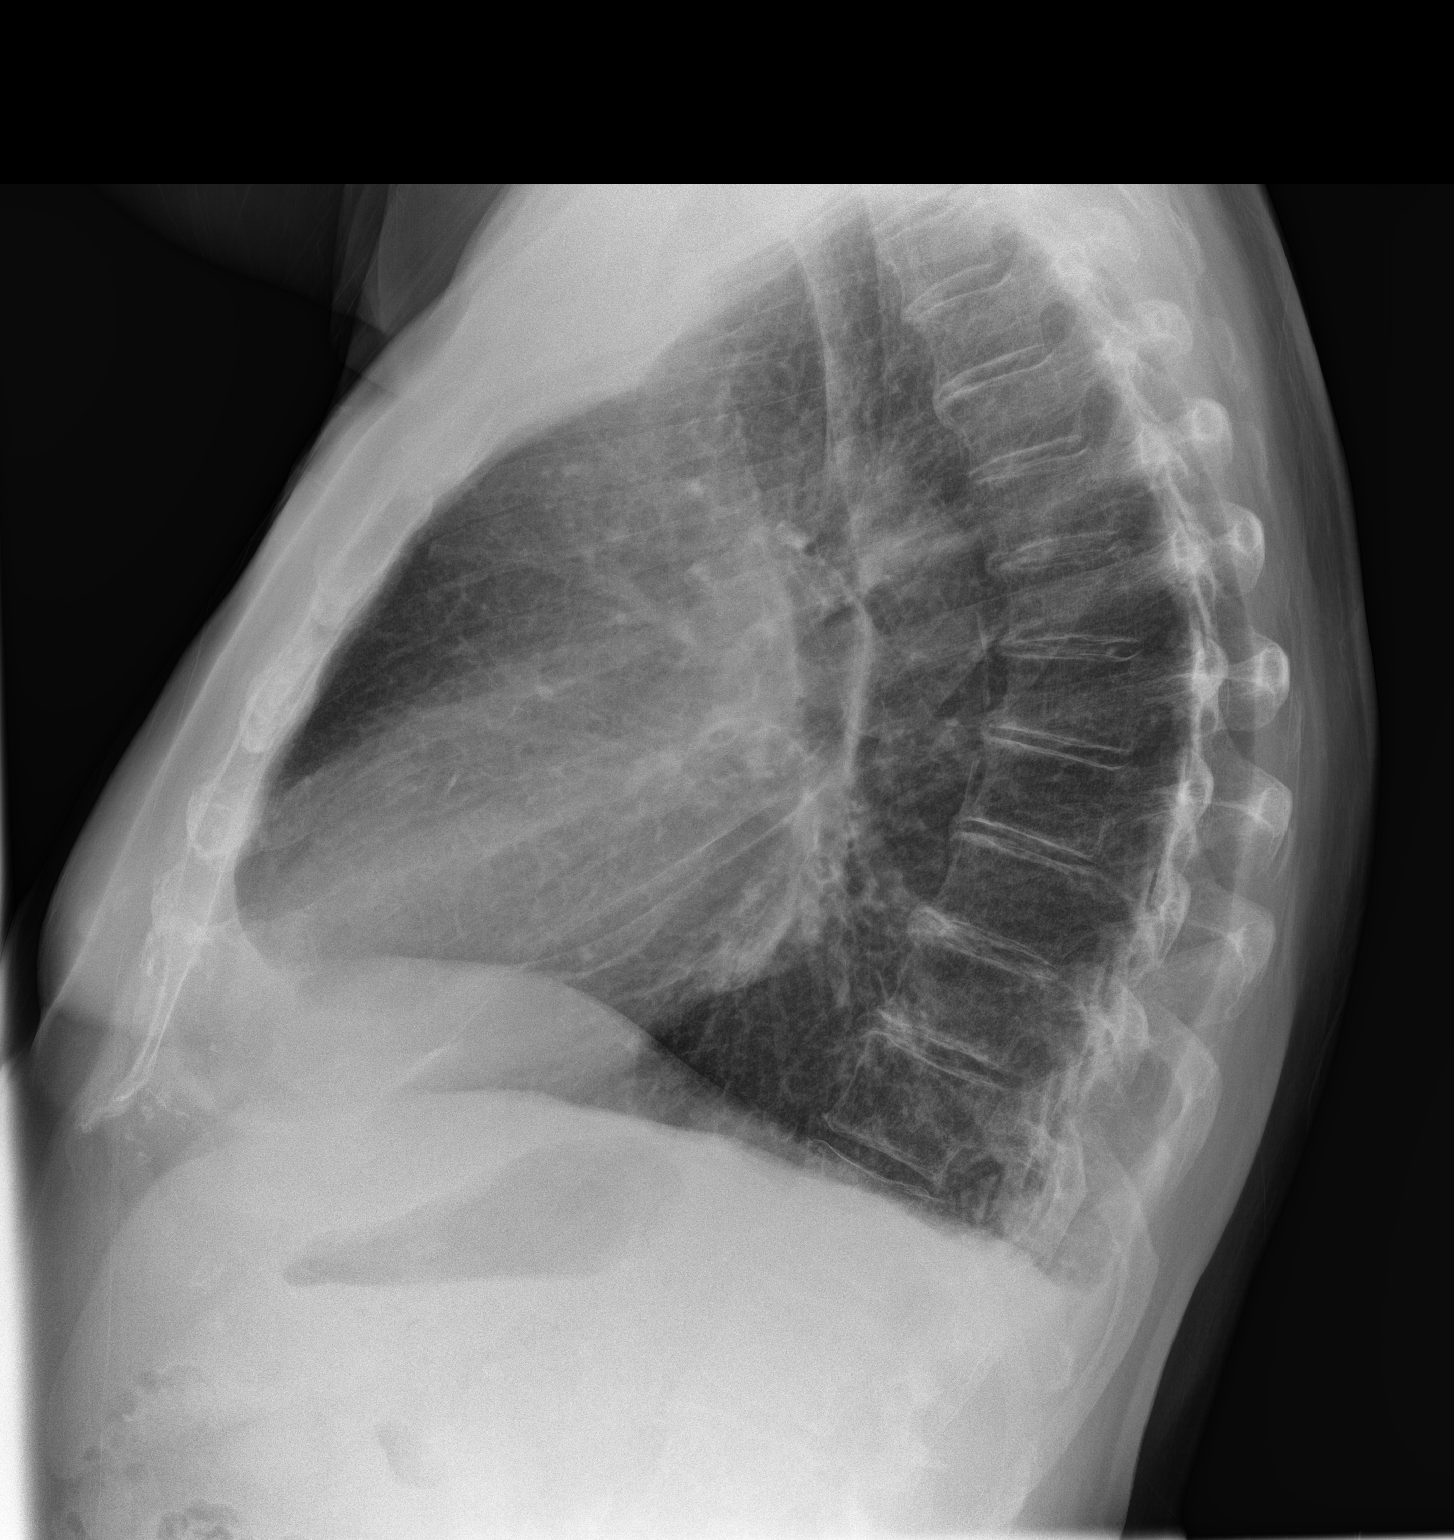

[2 of 2 positions shown; findings below may reference images not displayed]

FINDINGS: There are small bilateral pleural effusions. Heart size is normal.
There is mild perihilar peribronchial thickening. There are no focal
consolidations. No pulmonary edema. Mild thoracic spondylosis.
IMPRESSION: 1. Bronchitic changes.
2. Small effusions.

## 2018-12-04 NOTE — Telephone Encounter (Signed)
Reviewed at tumor conference; Discussed with IR- recommend PET.  I have called twice to reach pt re: plan.  Hayley- please call pt/ and if he is agreeable; please order PET scan asap for mediastinal LN/ling nodules.   Please schedule follow up with me in 1-2 days after the PET scan.  Thank  GB

## 2018-12-04 NOTE — Telephone Encounter (Signed)
Scheduling msg sent to pool to arrange for pet scan  With the msg to communicate this apts with Hayley. She will call the patient.

## 2018-12-04 NOTE — Progress Notes (Signed)
Tumor Board Documentation  Toure M Mol was presented by Dr Rogue Bussing at our Tumor Board on 12/04/2018, which included representatives from medical oncology, radiation oncology, surgical, radiology, pathology, navigation, internal medicine, research, palliative care, pulmonology.  Devlin currently presents as a new patient, for discussion, for [place] with history of the following treatments: active survellience.  Additionally, we reviewed previous medical and familial history, history of present illness, and recent lab results along with all available histopathologic and imaging studies. The tumor board considered available treatment options and made the following recommendations:   PET scan Radiology will review films and get back in touch with Dr Rogue Bussing  The following procedures/referrals were also placed: No orders of the defined types were placed in this encounter.   Clinical Trial Status: not discussed   Staging used:    National site-specific guidelines   were discussed with respect to the case.  Tumor board is a meeting of clinicians from various specialty areas who evaluate and discuss patients for whom a multidisciplinary approach is being considered. Final determinations in the plan of care are those of the provider(s). The responsibility for follow up of recommendations given during tumor board is that of the provider.   Today's extended care, comprehensive team conference, Harl was not present for the discussion and was not examined.   Multidisciplinary Tumor Board is a multidisciplinary case peer review process.  Decisions discussed in the Multidisciplinary Tumor Board reflect the opinions of the specialists present at the conference without having examined the patient.  Ultimately, treatment and diagnostic decisions rest with the primary provider(s) and the patient.

## 2018-12-04 NOTE — Progress Notes (Signed)
x

## 2018-12-05 ENCOUNTER — Other Ambulatory Visit: Payer: Self-pay

## 2018-12-05 ENCOUNTER — Encounter: Payer: Self-pay | Admitting: Nurse Practitioner

## 2018-12-05 ENCOUNTER — Other Ambulatory Visit: Payer: Self-pay | Admitting: Internal Medicine

## 2018-12-05 ENCOUNTER — Ambulatory Visit: Payer: PPO | Admitting: Nurse Practitioner

## 2018-12-05 VITALS — BP 144/60 | HR 71 | Ht 69.0 in | Wt 190.0 lb

## 2018-12-05 DIAGNOSIS — Z952 Presence of prosthetic heart valve: Secondary | ICD-10-CM | POA: Diagnosis not present

## 2018-12-05 DIAGNOSIS — I442 Atrioventricular block, complete: Secondary | ICD-10-CM | POA: Diagnosis not present

## 2018-12-05 DIAGNOSIS — E782 Mixed hyperlipidemia: Secondary | ICD-10-CM

## 2018-12-05 DIAGNOSIS — I38 Endocarditis, valve unspecified: Secondary | ICD-10-CM

## 2018-12-05 DIAGNOSIS — I10 Essential (primary) hypertension: Secondary | ICD-10-CM | POA: Diagnosis not present

## 2018-12-05 DIAGNOSIS — I421 Obstructive hypertrophic cardiomyopathy: Secondary | ICD-10-CM

## 2018-12-05 DIAGNOSIS — I482 Chronic atrial fibrillation, unspecified: Secondary | ICD-10-CM

## 2018-12-05 DIAGNOSIS — Z9889 Other specified postprocedural states: Secondary | ICD-10-CM

## 2018-12-05 DIAGNOSIS — R590 Localized enlarged lymph nodes: Secondary | ICD-10-CM

## 2018-12-05 MED ORDER — WARFARIN SODIUM 3 MG PO TABS: ORAL_TABLET | ORAL | 0 refills | Status: DC

## 2018-12-05 NOTE — Progress Notes (Signed)
Office Visit    Patient Name: [name] [name] Date of Encounter: 12/05/2018  Primary Care Provider:  [name], MD Primary Cardiologist:  Kathlyn Sacramento, MD  Chief Complaint    85 year old male with a history of chronic atrial fibrillation, severe aortic stenosis, severe mitral regurgitation, hypertrophic obstructive cardiomyopathy status post myomectomy, aortic and mitral valvular disease status post AVR and MVR, hypertension, hyperlipidemia, type 2 diabetes mellitus, sleep apnea, hypothyroidism, and complete heart block status post permanent pacemaker, who presents for follow-up after recent hospitalization.  Past Medical History    Past Medical History:  Diagnosis Date  . Aortic stenosis    a. 01/2016 echo: mod AS; b. 04/2018 Cardiac MRI: sev AS; c. 05/2018 s/p mech AVR @ time of myomectomy; d. 07/2018 Echo: Mild AS/AI.  [name] Atherosclerosis of aorta ([place])    by CT scan in past  . Atrial fibrillation ([place])    and aflutter. pt has a left atrial circuit that is not ablated. was on amiodarone-stopped, now use rate control.   . Bladder cancer ([place])    hx; treated with BCG in past   . Carotid artery disease ([place])    There was calcified plaque but no stenosis by carotid artery screening done at [facility] October, 2013  . CHB (complete heart block) ([place])    a. 05/2018 s/p BSX VVIR PPM ([facility]) in setting of bradycardia following myomectomy and AVR/MVR.  . Diabetes mellitus    type not specified  . Diabetic neuropathy (HCC)    feet  . Elevated liver enzymes    over time; hx  . Excessive sweating   . Glaucoma   . Gout   . Head injury    when slipped on ice 2004-2005. stabilized and back on Coumadin   . Headache    migraines - distant past  . History of cardiac cath    a. 05/2018 Cath ([facility]): Non-obstructive CAD. Mildly elevated filling pressures w/ nl CI.  [name] HOCM (hypertrophic obstructive cardiomyopathy) ([place])    a. 01/2016 Echo: EF 65-70%, no rwma,  LVOT gradient of 80-64mmHg, mod AS, SAM; b. 11/2017 Echo: EF 55-60%, near cavity obliteration in systole, mod AS, mild MS; c. 04/2018 Card MRI ([facility]): Sev LVH, EF >70%, Sev AS, LVOT obs w/ Sev MR, mild to mod TR/PR, mid-myocardial HE in basal-mid anteroseptum and inferoseptum; d. 05/2018 s/p Septal myomectomy; e. 07/2018 Echo: EF 60-65%. PASP 107mmHg.  [name] Homocystinemia ([place])    elevated, mild   . Hypercholesterolemia    treated.   . Hypertension   . Kidney mass    a. s/p laproscopic surgery woth cryoablation of a mass outside kidney followed at [facility]; b. 11/2018 CT Chest: 2cm R kidney mass.  . Mediastinal adenopathy    a. 12/208 CT Chest: up to 22mm LLL lung nodule. 1.5-1.7cm subcarinal/mediastinal adenopahty/right paratracheal lymph node; b. 11/2018 CT Chest: 2.5-3cm mediastinal adenopathy.  . Mitral regurgitation    a. 04/2018 Cardiac MRI: sev MR; b. 05/2018 s/p mech MVR @ time of myomectomy; c. 07/2018 Echo: Mild MS.  . Motion sickness    ocean boats  . Nausea   . Nonobstructive coronary artery disease    a. 05/2018 Cath ([facility]): nonobs CAD.  [name] Obstructive sleep apnea    CPAP started successfully 2014  . Orthostatic hypotension    a. orthostatic. dehydration. hospitalized 11/11  . Sleep apnea    Significant obstructive sleep apnea diagnosed in August, 2012, the patient is to receive CPAP   .  Stroke [facility])    "2 old strokes" CT and MRI. [place] hospital 11/11. diagnosis was dehydration, no acutal reports.   . TSH elevation    on synthroid historically   . Unsteady gait    August, 2012   Past Surgical History:  Procedure Laterality Date  . BLADDER SURGERY    . CATARACT EXTRACTION W/PHACO Left 05/11/2015   Procedure: CATARACT EXTRACTION PHACO AND INTRAOCULAR LENS PLACEMENT (IOC);  Surgeon: Leandrew Koyanagi, MD;  Location: [facility];  Service: Ophthalmology;  Laterality: Left;  DIABETIC - oral meds, CPAP  . ESOPHAGOGASTRODUODENOSCOPY (EGD) WITH PROPOFOL N/A 07/01/2018   Procedure:  ESOPHAGOGASTRODUODENOSCOPY (EGD) WITH PROPOFOL;  Surgeon: [name], MD;  Location: [facility] ENDOSCOPY;  Service: Endoscopy;  Laterality: N/A;  . INSERT / REPLACE / REMOVE PACEMAKER     Chemical engineer   . KIDNEY SURGERY     "froze mass"  . MECHANICAL AORTIC AND MITRAL VALVE REPLACEMENT  05/2018   [facility]   . TONSILLECTOMY    . VALVE REPLACEMENT      Allergies  Allergies  Allergen Reactions  . Macrolides And Ketolides Other (See Comments)  . Mycinettes   . Nitrofuran Derivatives Other (See Comments)  . Nitrofurantoin Other (See Comments)  . Erythromycin Itching and Rash    Other reaction(s): UNKNOWN  And red skin  . Zofran [Ondansetron Hcl-Dextrose] Rash  . Zofran [Ondansetron Hcl] Rash    History of Present Illness    85 year old male with above complex past medical history including chronic atrial fibrillation, aortic stenosis, HOCM, hyperlipidemia, hypertension, type 2 diabetes mellitus, sleep apnea, mild regurgitation, and hypothyroidism.  He was evaluated at [facility] in mid 2019 in the setting of progressive dyspnea and HOCM.  Cardiac MRI showed significant LVH with left ventricular outflow tract obstruction along with severe aortic stenosis and severe much regurgitation.  Right and left heart cardiac catheterization showed nonobstructive CAD and normal cardiac index.  He was subsequently referred to cardiothoracic surgery and underwent successful septal myomectomy with mechanical aortic valve replacement and mechanical mitral valve replacement.  Postoperative course complicated by anemia requiring transfusion as well as pleural effusion requiring thoracentesis.  He also suffers from complete heart block and required placement of a [employer] single lead permanent pacemaker.  He was discharged to rehabilitation and then in September 2019, he was admitted to [facility] in the setting of GI bleeding with supratherapeutic INR.  EGD showed duodenal ulcer with a visible vessel  that was treated with hemo-spray.  Follow-up echocardiogram October 2019 showed normal LV function with appropriately functioning mechanical valves and improved pulmonary artery systolic pressure.  He was last seen in clinic in January by Dr. Fletcher Anon..  He has since completed cardiac rehabilitation and overall tolerated that experience well.  He was in his usual state of health until Thursday, February 13, when he drove to [place] near [facility] and noted difficulty hearing from his right ear and also dizziness with unsteady gait.  This worsened and he was seen at a hospital in [place], [place].  There, work-up was on remarkable with negative head CT.  An old cerebellar stroke was noted.  He was unable to undergo MRI secondary to pacemaker.  He also had a chest CT which was negative for PE but did show mediastinal lymphadenopathy as well as a 2 cm right renal mass.  Patient and family requested transfer to [facility] regional for further care.  There, he was noted to have a significant that of earwax  in the right ear.  He was subsequently discharged and went to urgent care afterward to have earwax removed.  Unfortunately, he did not notice any significant improvement in his dizziness after lavage of earwax.  In the setting of abnormal CT findings he was referred to oncology and is now pending a PET scan.  He has also been seen by ENT and will receive steroid injections into his ear in the near future.  There is concern that he may not regain hearing in that ear.  Fortunately, from a cardiac standpoint, he has been stable.  He denies chest pain, palpitations, PND, orthopnea, dizziness, syncope, edema, or early satiety.  His wife and daughter indicate that he has been somewhat dyspneic on exertion however, he denies this.  He says he has been unsteady on his feet ever since his trip to [place] and has been using his walker.  Home Medications    Prior to Admission medications    Medication Sig Start Date End Date Taking? Authorizing Provider  acetaminophen (TYLENOL) 325 MG tablet Take 650 mg by mouth every 6 (six) hours as needed. 06/15/18  Yes [provider]  aspirin EC 81 MG tablet Take 1 tablet (81 mg total) by mouth daily. 09/04/18  Yes Deboraha Sprang, MD  glucose blood (ONE TOUCH ULTRA TEST) test strip USE ONE STRIP TO CHECK GLUCOSE ONCE DAILY 03/25/17  Yes [name], MD  latanoprost (XALATAN) 0.005 % ophthalmic solution Place 1 drop into both eyes at bedtime.  11/13/13  Yes [provider]  levothyroxine (SYNTHROID, LEVOTHROID) 50 MCG tablet TAKE 1 TABLET BY MOUTH ONCE DAILY 08/10/18  Yes [name], MD  metFORMIN (GLUCOPHAGE) 1000 MG tablet Take 1 tablet (1,000 mg total) by mouth daily with breakfast. 08/19/18 08/19/19 Yes [name], MD  metoprolol tartrate (LOPRESSOR) 25 MG tablet Take 12.5 mg by mouth 2 (two) times daily.   Yes [provider]  ONE TOUCH ULTRA TEST test strip USE TO TEST BLOOD SUGAR ONCE DAILY 05/02/18  Yes [name], MD  [facility] DELICA LANCETS 32K MISC USE ONE LANCET TO CHECK BLOOD GLUCOSE ONCE DAILY 03/25/17  Yes [name], MD  [facility] DELICA LANCETS 02R MISC USE TO TEST BLOOD SUGAR ONCE DAILY 05/02/18  Yes [name], MD  pantoprazole (PROTONIX) 40 MG tablet Take 1 tablet (40 mg total) by mouth 2 (two) times daily. Patient taking differently: Take 40 mg by mouth daily.  09/15/18  Yes [name], MD  simvastatin (ZOCOR) 40 MG tablet TAKE 1/2 (ONE-HALF) TABLET BY MOUTH AT BEDTIME 05/05/18  Yes Wellington Hampshire, MD  warfarin (COUMADIN) 3 MG tablet Take 1.5 to 2 tablets daily as directed by Coumadin clinic 12/05/18   Wellington Hampshire, MD    Review of Systems    Dizziness as above.  He denies chest pain, palpitations, dyspnea, pnd, orthopnea, n, v, syncope, edema, weight gain, or early satiety.  All other systems reviewed and are  otherwise negative except as noted above.  Physical Exam    VS:  BP (!) 144/60 (BP Location: Left Arm, Patient Position: Sitting, Cuff Size: Normal)   Pulse 71   Ht 5\' 9"  (1.753 m)   Wt 190 lb (86.2 kg)   BMI 28.06 kg/m  , BMI Body mass index is 28.06 kg/m. GEN: Well nourished, well developed, in no acute distress. HEENT: normal. Neck: Supple, no JVD, carotid bruits, or masses. Cardiac:  RRR, mechanical S1 and S2 without murmurs, rubs, or gallops. No clubbing, cyanosis, edema.  Radials/DP/PT 2+ and equal bilaterally.  Respiratory:  Respirations regular and unlabored, clear to auscultation bilaterally. GI: Soft, nontender, nondistended, BS + x 4. MS: no deformity or atrophy. Skin: warm and dry, no rash. Neuro:  Strength and sensation are intact. Psych: Normal affect.  Accessory Clinical Findings    ECG personally reviewed by me today -atrial fibrillation, 71, left bundle branch block- no acute changes.  Lab Results  Component Value Date   INR 2.22 12/02/2018   INR 2.29 12/01/2018   INR 3.7 (A) 11/17/2018   PROTIME 18.1 03/25/2009    Assessment & Plan    1.  Hypertrophic obstructive cardiomyopathy: Status post septal myomectomy at [facility] in the summer 2019 with the addition of mechanical mitral and aortic valve replacements.  Follow-up echo in October showed normal LV function with properly functioning valves.  Euvolemic on examination today and he remains on beta-blocker therapy.  2.  Valvular heart disease: Status post mechanical mitral and aortic valve replacements.  He is chronically anticoagulated with Coumadin and followed closely in our Coumadin clinic.  Normal functioning valves on echo in October 2019.  Continue low-dose aspirin as well.  3.  Complete heart block: This developed following septal myomectomy.  He is status post single lead Product/process development scientist and this is followed at [facility].  4.  Permanent atrial fibrillation: Rate controlled on beta-blocker therapy.   Anticoagulated with Coumadin.  4.  Essential hypertension: Blood pressure elevated today though he notes that this is unusual for him.  He will follow at home.  Continue beta-blocker.  5.  Nonobstructive CAD: Minimal CAD on catheterization in August 2019.  Continue aspirin, beta-blocker, and statin therapy.  6.  Mediastinal lymphadenopathy and renal mass: Incidentally noted on recent CT at outside hospital in [place].  He has been seen by oncology with plan for PET scan.  It is felt he will eventually require biopsy.  He will require Lovenox bridging in the setting of mechanical valves, A. fib, and prior stroke noted on CT.  This can be arranged through our Coumadin clinic at the appropriate time.  7.  Type 2 diabetes mellitus: A1c 5.5 in September 2019 and he remains on metformin therapy.  8.  Hyperlipidemia: He remains on statin therapy.  9.  Duodenal ulcer/history of GI bleed: Admission in September 2019.  No recurrent issues.  He remains on PPI therapy.  10.  Dizziness:  Being seen by ENT w/ plan for otic steroid injections per family (note not available).  11. Disposition: Follow-up with Dr. Fletcher Anon in 3 months or sooner if necessary.  When appropriate, our Coumadin clinic can help arrange for Lovenox bridging.  Murray Hodgkins, NP 12/05/2018, 2:14 PM

## 2018-12-05 NOTE — Telephone Encounter (Signed)
Message left with patient to callback to review appts and recommendations from conference.

## 2018-12-05 NOTE — Patient Instructions (Signed)
Medication Instructions:  Your physician recommends that you continue on your current medications as directed. Please refer to the Current Medication list given to you today. If you need a refill on your cardiac medications before your next appointment, please call your pharmacy.   Lab work: None ordered  If you have labs (blood work) drawn today and your tests are completely normal, you will receive your results only by: . MyChart Message (if you have MyChart) OR . A paper copy in the mail If you have any lab test that is abnormal or we need to change your treatment, we will call you to review the results.  Testing/Procedures: None ordered   Follow-Up: At [facility], you and your health needs are our priority.  As part of our continuing mission to provide you with exceptional heart care, we have created designated Provider Care Teams.  These Care Teams include your primary Cardiologist (physician) and Advanced Practice Providers (APPs -  Physician Assistants and Nurse Practitioners) who all work together to provide you with the care you need, when you need it. You will need a follow up appointment in 3 months.   You may see Muhammad Arida, MD or Christopher Berge, NP.   

## 2018-12-05 NOTE — Patient Outreach (Signed)
[facility]) Care Management  12/05/2018  [name] [name] 1934-03-09 [id number]   EMMI- General Discharge RED ON EMMI ALERT Day # 1 Date: 12/04/2018 Red Alert Reason:  Questions about discharge papers? Yes  Scheduled follow-up? No  Other questions/problems? Yes    Outreach attempt: Spoke with wife [name] [name].  She is able to verify HIPAA.  Addressed red alerts.  She states that she got all her questions answered yesterday as there was some confusion about his INR.  She states she has spoken with the nurse and got things clarified.  She states that patient is needing a PET scan and will be seeing the doctor and based on findings whether to treat or not.  She voiced some dissatisfaction with hospital stay but states she will be complaining further.  She states that patient was recommended rehab as he could not stand due to dizziness but they chose to go home.   Patient does have home health scheduled to come out per wife and has an appointment with the physician today for further evaluation.  She declined any other needs presently.     Plan: RN CM will close case.    Jone Baseman, RN, MSN [facility] Care Management Care Management Coordinator Direct Line [phone] Toll Free: [phone]  Fax: [phone]

## 2018-12-05 NOTE — Telephone Encounter (Signed)
Spoke with patient and pt's stepdaughter, Suanne Marker. Reviewed upcoming appts and MD recommendations from conference. All questions answered during phone call. Nothing further needed at this time.

## 2018-12-06 DIAGNOSIS — I700 Atherosclerosis of aorta: Secondary | ICD-10-CM | POA: Diagnosis not present

## 2018-12-06 DIAGNOSIS — I10 Essential (primary) hypertension: Secondary | ICD-10-CM | POA: Diagnosis not present

## 2018-12-06 DIAGNOSIS — I4891 Unspecified atrial fibrillation: Secondary | ICD-10-CM | POA: Diagnosis not present

## 2018-12-06 DIAGNOSIS — N2889 Other specified disorders of kidney and ureter: Secondary | ICD-10-CM | POA: Diagnosis not present

## 2018-12-06 DIAGNOSIS — I4892 Unspecified atrial flutter: Secondary | ICD-10-CM | POA: Diagnosis not present

## 2018-12-06 DIAGNOSIS — E1151 Type 2 diabetes mellitus with diabetic peripheral angiopathy without gangrene: Secondary | ICD-10-CM | POA: Diagnosis not present

## 2018-12-06 DIAGNOSIS — E785 Hyperlipidemia, unspecified: Secondary | ICD-10-CM | POA: Diagnosis not present

## 2018-12-06 DIAGNOSIS — I421 Obstructive hypertrophic cardiomyopathy: Secondary | ICD-10-CM | POA: Diagnosis not present

## 2018-12-06 DIAGNOSIS — I2510 Atherosclerotic heart disease of native coronary artery without angina pectoris: Secondary | ICD-10-CM | POA: Diagnosis not present

## 2018-12-06 DIAGNOSIS — E1140 Type 2 diabetes mellitus with diabetic neuropathy, unspecified: Secondary | ICD-10-CM | POA: Diagnosis not present

## 2018-12-06 DIAGNOSIS — H409 Unspecified glaucoma: Secondary | ICD-10-CM | POA: Diagnosis not present

## 2018-12-06 DIAGNOSIS — I442 Atrioventricular block, complete: Secondary | ICD-10-CM | POA: Diagnosis not present

## 2018-12-06 DIAGNOSIS — M109 Gout, unspecified: Secondary | ICD-10-CM | POA: Diagnosis not present

## 2018-12-08 ENCOUNTER — Inpatient Hospital Stay: Payer: PPO | Admitting: Family Medicine

## 2018-12-08 ENCOUNTER — Telehealth: Payer: Self-pay | Admitting: Family Medicine

## 2018-12-08 ENCOUNTER — Ambulatory Visit: Payer: PPO

## 2018-12-08 ENCOUNTER — Encounter: Payer: PPO | Admitting: *Deleted

## 2018-12-08 DIAGNOSIS — Z950 Presence of cardiac pacemaker: Secondary | ICD-10-CM | POA: Diagnosis not present

## 2018-12-08 DIAGNOSIS — Z5181 Encounter for therapeutic drug level monitoring: Secondary | ICD-10-CM | POA: Diagnosis not present

## 2018-12-08 DIAGNOSIS — I482 Chronic atrial fibrillation, unspecified: Secondary | ICD-10-CM

## 2018-12-08 DIAGNOSIS — H9121 Sudden idiopathic hearing loss, right ear: Secondary | ICD-10-CM | POA: Diagnosis not present

## 2018-12-08 DIAGNOSIS — I4891 Unspecified atrial fibrillation: Secondary | ICD-10-CM

## 2018-12-08 DIAGNOSIS — Z952 Presence of prosthetic heart valve: Secondary | ICD-10-CM | POA: Diagnosis not present

## 2018-12-08 DIAGNOSIS — I4892 Unspecified atrial flutter: Secondary | ICD-10-CM | POA: Diagnosis not present

## 2018-12-08 LAB — POCT INR: INR: 3.5 — AB (ref 2.0–3.0)

## 2018-12-08 MED ORDER — WARFARIN SODIUM 3 MG PO TABS: ORAL_TABLET | ORAL | 0 refills | Status: DC

## 2018-12-08 NOTE — Patient Instructions (Signed)
Please continue dosage of 1.5 tablets every day EXCEPT 2 TABLETS ON FRIDAYS. Recheck in 3 weeks. Please call [phone] if you have any further procedures.

## 2018-12-08 NOTE — Telephone Encounter (Signed)
[place] for patient to be on coumadin and aspirin? Please advise. Thanks!

## 2018-12-08 NOTE — Telephone Encounter (Signed)
yes

## 2018-12-08 NOTE — Telephone Encounter (Signed)
Thurmond Butts, RN w/ [place] [phone]  Verbal Orders for RN [place]:  1 time a week for 3 weeks  Just making Dr. Rosanna Randy aware pt is on: Coumadin and aspirin  Thanks, [facility]

## 2018-12-08 NOTE — Telephone Encounter (Signed)
Advised [name] [name] as below.

## 2018-12-08 NOTE — Telephone Encounter (Signed)
LM as below.

## 2018-12-08 NOTE — Telephone Encounter (Signed)
Theodoro Kos, PT w/ [place] - secure line - can leave VM.  Verbal Orders for Home Health PT:  2 times a week  3  Weeks  Thanks, [employer]

## 2018-12-09 ENCOUNTER — Ambulatory Visit
Admission: RE | Admit: 2018-12-09 | Discharge: 2018-12-09 | Disposition: A | Discharge status: Home / Self Care | Payer: PPO | Source: Ambulatory Visit | Attending: Internal Medicine | Admitting: Internal Medicine

## 2018-12-09 ENCOUNTER — Ambulatory Visit (INDEPENDENT_AMBULATORY_CARE_PROVIDER_SITE_OTHER): Payer: PPO | Admitting: Family Medicine

## 2018-12-09 VITALS — BP 150/64 | HR 70 | Temp 98.2°F | Resp 16 | Wt 191.0 lb

## 2018-12-09 DIAGNOSIS — R590 Localized enlarged lymph nodes: Secondary | ICD-10-CM | POA: Diagnosis not present

## 2018-12-09 DIAGNOSIS — Z950 Presence of cardiac pacemaker: Secondary | ICD-10-CM | POA: Diagnosis not present

## 2018-12-09 DIAGNOSIS — I700 Atherosclerosis of aorta: Secondary | ICD-10-CM | POA: Diagnosis not present

## 2018-12-09 DIAGNOSIS — C649 Malignant neoplasm of unspecified kidney, except renal pelvis: Secondary | ICD-10-CM | POA: Diagnosis not present

## 2018-12-09 DIAGNOSIS — C641 Malignant neoplasm of right kidney, except renal pelvis: Secondary | ICD-10-CM | POA: Diagnosis not present

## 2018-12-09 DIAGNOSIS — I4819 Other persistent atrial fibrillation: Secondary | ICD-10-CM | POA: Diagnosis not present

## 2018-12-09 DIAGNOSIS — I2510 Atherosclerotic heart disease of native coronary artery without angina pectoris: Secondary | ICD-10-CM | POA: Insufficient documentation

## 2018-12-09 DIAGNOSIS — R42 Dizziness and giddiness: Secondary | ICD-10-CM | POA: Diagnosis not present

## 2018-12-09 DIAGNOSIS — I517 Cardiomegaly: Secondary | ICD-10-CM | POA: Diagnosis not present

## 2018-12-09 DIAGNOSIS — I421 Obstructive hypertrophic cardiomyopathy: Secondary | ICD-10-CM

## 2018-12-09 DIAGNOSIS — H9313 Tinnitus, bilateral: Secondary | ICD-10-CM

## 2018-12-09 DIAGNOSIS — J328 Other chronic sinusitis: Secondary | ICD-10-CM | POA: Diagnosis not present

## 2018-12-09 DIAGNOSIS — N400 Enlarged prostate without lower urinary tract symptoms: Secondary | ICD-10-CM | POA: Diagnosis not present

## 2018-12-09 DIAGNOSIS — K7460 Unspecified cirrhosis of liver: Secondary | ICD-10-CM | POA: Insufficient documentation

## 2018-12-09 DIAGNOSIS — E1142 Type 2 diabetes mellitus with diabetic polyneuropathy: Secondary | ICD-10-CM | POA: Diagnosis not present

## 2018-12-09 DIAGNOSIS — Z7901 Long term (current) use of anticoagulants: Secondary | ICD-10-CM | POA: Diagnosis not present

## 2018-12-09 LAB — GLUCOSE, CAPILLARY: Glucose-Capillary: 103 mg/dL — ABNORMAL HIGH (ref 70–99)

## 2018-12-09 MED ORDER — FLUDEOXYGLUCOSE F - 18 (FDG) INJECTION
9.8300 | Freq: Once | INTRAVENOUS | Status: AC | PRN
  Administered 2018-12-09: 9.83 via INTRAVENOUS

## 2018-12-09 NOTE — Progress Notes (Signed)
[name] [name]  MRN: [id number] DOB: [birth year]  Subjective:  HPI   Patient is an 85 year old male who presents for follow up of his hospitalization [place].   The patient was out of state and had dizziness resulting in him going to the hospital.  The patient ultimately had scans that revealed some nodules in his lungs.  The patient has already had his PET scan and also seen by ENT. He is continued mild dizziness  but is getting a strength back.  Patient Active Problem List   Diagnosis Date Noted  . Pulmonary nodules 12/01/2018  . Mediastinal lymphadenopathy 12/01/2018  . Heart block AV third degree ([place]) 10/07/2018  . Pacemaker 10/07/2018  . H/O aortic valve replacement 10/07/2018  . Blood in stool   . Bleeding duodenal ulcer   . GIB (gastrointestinal bleeding) 06/30/2018  . Acute upper GI bleed   . Symptomatic anemia   . Heart murmur, systolic 51/76/1607  . H/O: rheumatic fever 03/21/2017  . Tinnitus 10/30/2016  . On warfarin therapy 07/06/2015  . Arteriosclerosis of coronary artery 06/13/2015  . Carcinoma in situ of bladder 06/13/2015  . Diabetes mellitus, type 2 ([place]) 06/13/2015  . Diabetic neuropathy ([place]) 06/13/2015  . Dizziness 06/13/2015  . Essential (primary) hypertension 06/13/2015  . Fatigue 06/13/2015  . Glaucoma 06/13/2015  . Hypercholesteremia 06/13/2015  . Male hypogonadism 06/13/2015  . Adult hypothyroidism 06/13/2015  . Arthritis, degenerative 06/13/2015  . CAP (community acquired pneumonia) 06/13/2015  . Adenocarcinoma, renal cell ([place]) 06/13/2015  . Benign head tremor 12/13/2014  . Carotid arterial disease ([place]) 07/12/2014  . Decreased cardiac output 07/12/2014  . Cardiomyopathy, hypertrophic obstructive ([place]) 07/12/2014  . Head injuries 07/12/2014  . HOCM (hypertrophic obstructive cardiomyopathy) ([place])   . Encounter for therapeutic drug monitoring 11/16/2013  . Obstructive sleep apnea   . Benign prostatic hypertrophy without urinary  obstruction 06/09/2012  . History of neoplasm of bladder 06/09/2012  . Head injury   . Atherosclerosis of aorta ([place])   . Hypotension   . Stroke ([place])   . Unsteady gait   . Excessive sweating   . Nausea   . ORTHOSTATIC HYPOTENSION, HX OF 09/01/2010  . Overweight(278.02) 03/07/2009  . MITRAL REGURGITATION 03/05/2009  . Chronic atrial fibrillation 03/05/2009  . Atrial fibrillation and flutter ([place]) 03/05/2009  . Mitral and aortic incompetence 03/05/2009    Past Medical History:  Diagnosis Date  . Aortic stenosis    a. 01/2016 echo: mod AS; b. 04/2018 Cardiac MRI: sev AS; c. 05/2018 s/p mech AVR @ time of myomectomy; d. 07/2018 Echo: Mild AS/AI.  [name] Atherosclerosis of aorta ([place])    by CT scan in past  . Atrial fibrillation ([place])    and aflutter. pt has a left atrial circuit that is not ablated. was on amiodarone-stopped, now use rate control.   . Bladder cancer ([place])    hx; treated with BCG in past   . Carotid artery disease ([place])    There was calcified plaque but no stenosis by carotid artery screening done at [facility] October, 2013  . CHB (complete heart block) ([place])    a. 05/2018 s/p BSX VVIR PPM ([facility]) in setting of bradycardia following myomectomy and AVR/MVR.  . Diabetes mellitus    type not specified  . Diabetic neuropathy ([facility])    feet  . Elevated liver enzymes    over time; hx  . Excessive sweating   . Glaucoma   . Gout   .  Head injury    when slipped on ice 2004-2005. stabilized and back on Coumadin   . Headache    migraines - distant past  . History of cardiac cath    a. 05/2018 Cath ([facility]): Non-obstructive CAD. Mildly elevated filling pressures w/ nl CI.  [name] HOCM (hypertrophic obstructive cardiomyopathy) ([place])    a. 01/2016 Echo: EF 65-70%, no rwma, LVOT gradient of 80-89mmHg, mod AS, SAM; b. 11/2017 Echo: EF 55-60%, near cavity obliteration in systole, mod AS, mild MS; c. 04/2018 Card MRI ([facility]): Sev LVH, EF >70%, Sev AS, LVOT obs w/ Sev MR, mild to  mod TR/PR, mid-myocardial HE in basal-mid anteroseptum and inferoseptum; d. 05/2018 s/p Septal myomectomy; e. 07/2018 Echo: EF 60-65%. PASP 48mmHg.  [name] Homocystinemia ([place])    elevated, mild   . Hypercholesterolemia    treated.   . Hypertension   . Kidney mass    a. s/p laproscopic surgery woth cryoablation of a mass outside kidney followed at [facility]; b. 11/2018 CT Chest: 2cm R kidney mass.  . Mediastinal adenopathy    a. 12/208 CT Chest: up to 5mm LLL lung nodule. 1.5-1.7cm subcarinal/mediastinal adenopahty/right paratracheal lymph node; b. 11/2018 CT Chest: 2.5-3cm mediastinal adenopathy.  . Mitral regurgitation    a. 04/2018 Cardiac MRI: sev MR; b. 05/2018 s/p mech MVR @ time of myomectomy; c. 07/2018 Echo: Mild MS.  . Motion sickness    ocean boats  . Nausea   . Nonobstructive coronary artery disease    a. 05/2018 Cath ([facility]): nonobs CAD.  [name] Obstructive sleep apnea    CPAP started successfully 2014  . Orthostatic hypotension    a. orthostatic. dehydration. hospitalized 11/11  . Sleep apnea    Significant obstructive sleep apnea diagnosed in August, 2012, the patient is to receive CPAP   . Stroke [facility])    "2 old strokes" CT and MRI. [place] [facility] 11/11. diagnosis was dehydration, no acutal reports.   . TSH elevation    on synthroid historically   . Unsteady gait    August, 2012    Social History   Socioeconomic History  . Marital status: Married    Spouse name: Jasmine Awe  . Number of children: 1  . Years of education: 59  . Highest education level: Bachelor's degree (e.g., BA, AB, BS)  Occupational History  . Occupation: retired  Scientific laboratory technician  . Financial resource strain: Not hard at all  . Food insecurity:    Worry: Never true    Inability: Never true  . Transportation needs:    Medical: No    Non-medical: No  Tobacco Use  . Smoking status: Former Smoker    Types: Cigars, Cigarettes    Last attempt to quit: 10/02/1974    Years since quitting: 44.2  . Smokeless  tobacco: Never Used  . Tobacco comment: still smokes cigars once a day  Substance and Sexual Activity  . Alcohol use: No    Frequency: Never  . Drug use: No  . Sexual activity: Yes  Lifestyle  . Physical activity:    Days per week: 0 days    Minutes per session: 0 min  . Stress: Not at all  Relationships  . Social connections:    Talks on phone: More than three times a week    Gets together: More than three times a week    Attends religious service: More than 4 times per year    Active member of club or organization: Yes    Attends meetings  of clubs or organizations: More than 4 times per year    Relationship status: Married  . Intimate partner violence:    Fear of current or ex partner: No    Emotionally abused: No    Physically abused: No    Forced sexual activity: No  Other Topics Concern  . Not on file  Social History Narrative   Married, retired, gets regular exercise.     Outpatient Encounter Medications as of 12/09/2018  Medication Sig  . acetaminophen (TYLENOL) 325 MG tablet Take 650 mg by mouth every 6 (six) hours as needed.  [name] aspirin EC 81 MG tablet Take 1 tablet (81 mg total) by mouth daily.  [name] glucose blood (ONE TOUCH ULTRA TEST) test strip USE ONE STRIP TO CHECK GLUCOSE ONCE DAILY  . latanoprost (XALATAN) 0.005 % ophthalmic solution Place 1 drop into both eyes at bedtime.   [name] levothyroxine (SYNTHROID, LEVOTHROID) 50 MCG tablet TAKE 1 TABLET BY MOUTH ONCE DAILY  . metFORMIN (GLUCOPHAGE) 1000 MG tablet Take 1 tablet (1,000 mg total) by mouth daily with breakfast.  . metoprolol tartrate (LOPRESSOR) 25 MG tablet Take 12.5 mg by mouth 2 (two) times daily.  . ONE TOUCH ULTRA TEST test strip USE TO TEST BLOOD SUGAR ONCE DAILY  . ONETOUCH DELICA LANCETS 20N MISC USE ONE LANCET TO CHECK BLOOD GLUCOSE ONCE DAILY  . ONETOUCH DELICA LANCETS 47S MISC USE TO TEST BLOOD SUGAR ONCE DAILY  . pantoprazole (PROTONIX) 40 MG tablet Take 1 tablet (40 mg total) by mouth 2 (two) times  daily. (Patient taking differently: Take 40 mg by mouth daily. )  . simvastatin (ZOCOR) 40 MG tablet TAKE 1/2 (ONE-HALF) TABLET BY MOUTH AT BEDTIME  . warfarin (COUMADIN) 3 MG tablet Take 1.5 to 2 tablets daily as directed by Coumadin clinic   No facility-administered encounter medications on file as of 12/09/2018.     Allergies  Allergen Reactions  . Macrolides And Ketolides Other (See Comments)  . Mycinettes   . Nitrofuran Derivatives Other (See Comments)  . Nitrofurantoin Other (See Comments)  . Erythromycin Itching and Rash    Other reaction(s): UNKNOWN  And red skin  . Zofran [Ondansetron Hcl-Dextrose] Rash  . Zofran [Ondansetron Hcl] Rash    Review of Systems  Constitutional: Positive for malaise/fatigue. Negative for fever.  HENT: Negative.   Eyes: Negative.   Respiratory: Negative for cough, shortness of breath and wheezing.   Cardiovascular: Negative for chest pain and leg swelling.  Gastrointestinal: Negative.   Skin: Negative.   Neurological: Positive for dizziness.  Endo/Heme/Allergies: Negative.   Psychiatric/Behavioral: The patient is nervous/anxious.     Objective:  BP (!) 150/64 (BP Location: Right Arm, Patient Position: Sitting, Cuff Size: Normal)   Pulse 70   Temp 98.2 F (36.8 C) (Oral)   Resp 16   Wt 191 lb (86.6 kg)   BMI 28.21 kg/m   Physical Exam  Constitutional: He is oriented to person, place, and time and well-developed, well-nourished, and in no distress.  HENT:  Head: Normocephalic and atraumatic.  Eyes: Conjunctivae are normal. No scleral icterus.  Neck: No thyromegaly present.  Cardiovascular: Normal rate, regular rhythm and normal heart sounds.  Pulmonary/Chest: Effort normal and breath sounds normal.  Abdominal: Soft.  Musculoskeletal:        General: No edema.  Neurological: He is alert and oriented to person, place, and time. Gait normal. GCS score is 15.  Skin: Skin is warm and dry.  Psychiatric: Mood, memory, affect and  judgment normal.    Assessment and Plan :   1. Vertigo He has follow-up appointment with ENT for vertigo and tinnitus.  Had at least 1 injection and is here for this.  2. Mediastinal lymphadenopathy He has follow-up with oncology tomorrow after CT scan and PET scans.  3. HOCM (hypertrophic obstructive cardiomyopathy) ([place]) Status post myomectomy  4. Tinnitus of both ears  5. Pacemaker   6. On warfarin therapy   7. Cancer of right kidney ([place])   8. Renal cell carcinoma, unspecified laterality ([place])  9. Diabetic polyneuropathy associated with type 2 diabetes mellitus ([place])   10. Other persistent atrial fibrillation Chronic warfarin.  I have done the exam and reviewed the chart and it is accurate to the best of my knowledge. Development worker, community has been used and  any errors in dictation or transcription are unintentional. Miguel Aschoff M.D. [facility]

## 2018-12-10 ENCOUNTER — Encounter: Payer: Self-pay | Admitting: *Deleted

## 2018-12-10 ENCOUNTER — Encounter: Payer: Self-pay | Admitting: Internal Medicine

## 2018-12-10 ENCOUNTER — Inpatient Hospital Stay: Payer: PPO | Admitting: Internal Medicine

## 2018-12-10 VITALS — BP 162/70 | HR 97 | Temp 97.9°F | Wt 191.8 lb

## 2018-12-10 DIAGNOSIS — Z952 Presence of prosthetic heart valve: Secondary | ICD-10-CM

## 2018-12-10 DIAGNOSIS — G4733 Obstructive sleep apnea (adult) (pediatric): Secondary | ICD-10-CM | POA: Diagnosis not present

## 2018-12-10 DIAGNOSIS — R42 Dizziness and giddiness: Secondary | ICD-10-CM | POA: Diagnosis not present

## 2018-12-10 DIAGNOSIS — R590 Localized enlarged lymph nodes: Secondary | ICD-10-CM

## 2018-12-10 DIAGNOSIS — C641 Malignant neoplasm of right kidney, except renal pelvis: Secondary | ICD-10-CM

## 2018-12-10 DIAGNOSIS — R918 Other nonspecific abnormal finding of lung field: Secondary | ICD-10-CM | POA: Diagnosis not present

## 2018-12-10 NOTE — Progress Notes (Signed)
Cardiac Individual Treatment Plan  Patient Details  Name: [name] [name] MRN: [id number] Date of Birth: 10-08-1934 Referring Provider:     Cardiac Rehab from 09/22/2018 in [facility] Cardiac and Pulmonary Rehab  Referring Provider  Kathlyn Sacramento MD      Initial Encounter Date:    Cardiac Rehab from 09/22/2018 in [facility] Cardiac and Pulmonary Rehab  Date  09/22/18      Visit Diagnosis: S/P aortic valve replacement  S/P mitral valve replacement  Patient's Home Medications on Admission:  Current Outpatient Medications:  .  acetaminophen (TYLENOL) 325 MG tablet, Take 650 mg by mouth every 6 (six) hours as needed., Disp: , Rfl:  .  aspirin EC 81 MG tablet, Take 1 tablet (81 mg total) by mouth daily., Disp: , Rfl:  .  glucose blood (ONE TOUCH ULTRA TEST) test strip, USE ONE STRIP TO CHECK GLUCOSE ONCE DAILY, Disp: 100 each, Rfl: 12 .  latanoprost (XALATAN) 0.005 % ophthalmic solution, Place 1 drop into both eyes at bedtime. , Disp: , Rfl:  .  levothyroxine (SYNTHROID, LEVOTHROID) 50 MCG tablet, TAKE 1 TABLET BY MOUTH ONCE DAILY, Disp: 90 tablet, Rfl: 3 .  metFORMIN (GLUCOPHAGE) 1000 MG tablet, Take 1 tablet (1,000 mg total) by mouth daily with breakfast., Disp: 90 tablet, Rfl: 3 .  metoprolol tartrate (LOPRESSOR) 25 MG tablet, Take 12.5 mg by mouth 2 (two) times daily., Disp: , Rfl:  .  ONE TOUCH ULTRA TEST test strip, USE TO TEST BLOOD SUGAR ONCE DAILY, Disp: 50 each, Rfl: 25 .  ONETOUCH DELICA LANCETS 91Y MISC, USE ONE LANCET TO CHECK BLOOD GLUCOSE ONCE DAILY, Disp: 100 each, Rfl: 12 .  ONETOUCH DELICA LANCETS 60A MISC, USE TO TEST BLOOD SUGAR ONCE DAILY, Disp: 100 each, Rfl: 12 .  pantoprazole (PROTONIX) 40 MG tablet, Take 1 tablet (40 mg total) by mouth 2 (two) times daily. (Patient taking differently: Take 40 mg by mouth daily. ), Disp: 60 tablet, Rfl: 3 .  simvastatin (ZOCOR) 40 MG tablet, TAKE 1/2 (ONE-HALF) TABLET BY MOUTH AT BEDTIME, Disp: 45 tablet, Rfl: 3 .  warfarin (COUMADIN) 3 MG  tablet, Take 1.5 to 2 tablets daily as directed by Coumadin clinic, Disp: 40 tablet, Rfl: 0  Past Medical History: Past Medical History:  Diagnosis Date  . Aortic stenosis    a. 01/2016 echo: mod AS; b. 04/2018 Cardiac MRI: sev AS; c. 05/2018 s/p mech AVR @ time of myomectomy; d. 07/2018 Echo: Mild AS/AI.  [name] Atherosclerosis of aorta ([place])    by CT scan in past  . Atrial fibrillation ([place])    and aflutter. pt has a left atrial circuit that is not ablated. was on amiodarone-stopped, now use rate control.   . Bladder cancer ([place])    hx; treated with BCG in past   . Carotid artery disease ([place])    There was calcified plaque but no stenosis by carotid artery screening done at [facility] October, 2013  . CHB (complete heart block) ([place])    a. 05/2018 s/p BSX VVIR PPM ([facility]) in setting of bradycardia following myomectomy and AVR/MVR.  . Diabetes mellitus    type not specified  . Diabetic neuropathy (H[facility])    feet  . Elevated liver enzymes    over time; hx  . Excessive sweating   . Glaucoma   . Gout   . Head injury    when slipped on ice 2004-2005. stabilized and back on Coumadin   . Headache    migraines -  distant past  . History of cardiac cath    a. 05/2018 Cath ([facility]): Non-obstructive CAD. Mildly elevated filling pressures w/ nl CI.  [name] HOCM (hypertrophic obstructive cardiomyopathy) ([place])    a. 01/2016 Echo: EF 65-70%, no rwma, LVOT gradient of 80-80mHg, mod AS, SAM; b. 11/2017 Echo: EF 55-60%, near cavity obliteration in systole, mod AS, mild MS; c. 04/2018 Card MRI ([facility]): Sev LVH, EF >70%, Sev AS, LVOT obs w/ Sev MR, mild to mod TR/PR, mid-myocardial HE in basal-mid anteroseptum and inferoseptum; d. 05/2018 s/p Septal myomectomy; e. 07/2018 Echo: EF 60-65%. PASP 3877mg.  . [place]ocystinemia ([place]   elevated, mild   . Hypercholesterolemia    treated.   . Hypertension   . Kidney mass    a. s/p laproscopic surgery woth cryoablation of a [facility]  [facility]2018 CT Chest: 2cm R kidney mass.  . Mediastinal adenopathy    a. 12/208 CT Chest: up to 77m42mLL lung nodule. 1.5-1.7cm subcarinal/mediastinal adenopahty/right paratracheal lymph node; b. 11/2018 CT Chest: 2.5-3cm mediastinal adenopathy.  . Mitral regurgitation    a. 04/2018 Cardiac MRI: sev MR; b. 05/2018 s/p mech MVR @ time of myomectomy; c. 07/2018 Echo: Mild MS.  . Motion sickness    ocean boats  . Nausea   . Nonobstructive coronary artery disease    a. 05/2018 Cath ([facility]): nonobs CAD.  . OMarland Kitchenstructive sleep apnea    CPAP started successfully 2014  . Orthostatic hypotension    a. orthostatic. dehydration. hospitalized 11/11  . Sleep apnea    Significant obstructive sleep apnea diagnosed in August, 2012, the patient is to receive CPAP   [facility] and [facility]. [place] [facility] 11/11. diagnosis was dehydration, no acutal reports.   . TSH elevation    on synthroid historically   . Unsteady gait    August, 2012    Tobacco Use: Social History   Tobacco Use  Smoking Status Former Smoker  . Types: Cigars, Cigarettes  . Last attempt to quit: 10/02/1974  . Years since quitting: 44.2  Smokeless Tobacco Never Used  Tobacco Comment   still smokes cigars once a day    Labs: Recent Review Flowsheet Data    Labs for ITP Cardiac and Pulmonary Rehab Latest Ref Rng & Units 12/10/2016 03/30/2017 08/05/2017 11/12/2017 07/10/2018   Cholestrol 100 - 199 mg/dL 168 137 - 147 -   LDLCALC 0 - 99 mg/dL 96 70 - 82 -   HDL >39 mg/dL 41 36 - 41 -   Trlycerides 0 - 149 mg/dL 155(H) 153 - 120 -   Hemoglobin A1c 4.8 - 5.6 % 6.6(H) 7.4 6.5 6.3(H) 5.5       Exercise Target Goals: Exercise Program Goal: Individual exercise prescription set using results from initial 6 min walk test and THRR while considering  patient's activity barriers and safety.   Exercise Prescription Goal: Initial exercise prescription builds to 30-45 minutes a day of aerobic activity, 2-3 days per week.   Home exercise guidelines will be given to patient during program as part of exercise prescription that the participant will acknowledge.  Activity Barriers & Risk Stratification: Activity Barriers & Cardiac Risk Stratification - 09/22/18 1341      Activity Barriers & Cardiac Risk Stratification   Activity Barriers  Decreased Ventricular Function;Arthritis;Deconditioning;Muscular Weakness   left knee pain from dislocation in college   Cardiac Risk Stratification  High       6 Minute  Walk: 6 Minute Walk    Row Name 09/22/18 1352 11/26/18 1727       6 Minute Walk   Phase  Initial  Discharge    Distance  1015 feet  1135 feet    Distance % Change  -  11.82 %    Distance Feet Change  -  120 ft    Walk Time  6 minutes  6 minutes    # of Rest Breaks  -  0    MPH  1.92  2.14    METS  2  1.8    RPE  13  15    Perceived Dyspnea   1  1    VO2 Peak  7.01  6.33    Symptoms  Yes (comment)  Yes (comment)    Comments  SOB, unsteady gait in shoes  Some shortness of breath    Resting HR  80 bpm  86 bpm    Resting BP  130/70  128/70    Resting Oxygen Saturation   98 %  -    Exercise Oxygen Saturation  during 6 min walk  94 %  -    Max Ex. HR  107 bpm  130 bpm    Max Ex. BP  156/76  142/60    2 Minute Post BP  134/64  -       Oxygen Initial Assessment:   Oxygen Re-Evaluation:   Oxygen Discharge (Final Oxygen Re-Evaluation):   Initial Exercise Prescription: Initial Exercise Prescription - 09/22/18 1400      Date of Initial Exercise RX and Referring Provider   Date  09/22/18    Referring Provider  Kathlyn Sacramento MD      Treadmill   MPH  1.3    Grade  0.5    Minutes  15    METs  2.09      NuStep   Level  1    SPM  80    Minutes  15    METs  2      Recumbant Elliptical   Level  1    RPM  50    Minutes  15    METs  2      Prescription Details   Frequency (times per week)  3    Duration  Progress to 45 minutes of aerobic exercise without signs/symptoms of physical  distress      Intensity   THRR 40-80% of Max Heartrate  103-126    Ratings of Perceived Exertion  11-13    Perceived Dyspnea  0-4      Progression   Progression  Continue to progress workloads to maintain intensity without signs/symptoms of physical distress.      Resistance Training   Training Prescription  Yes    Weight  3 lbs    Reps  10-15       Perform Capillary Blood Glucose checks as needed.  Exercise Prescription Changes: Exercise Prescription Changes    Row Name 09/22/18 1400 10/07/18 0900 10/22/18 1200 11/05/18 1500 11/18/18 1100     Response to Exercise   Blood Pressure (Admit)  130/70  130/80  124/70  138/64  112/56   Blood Pressure (Exercise)  156/76  148/64  138/82  158/73  150/82   Blood Pressure (Exit)  134/64  150/80  122/82  128/60  130/80   Heart Rate (Admit)  80 bpm  75 bpm  80 bpm  70 bpm  66 bpm   Heart  Rate (Exercise)  100 bpm  130 bpm  96 bpm  131 bpm  83 bpm   Heart Rate (Exit)  76 bpm  73 bpm  58 bpm  70 bpm  65 bpm   Oxygen Saturation (Admit)  98 %  -  -  -  -   Oxygen Saturation (Exercise)  94 %  -  -  -  -   Rating of Perceived Exertion (Exercise)  _0 Perceived Dyspnea (Exercise)  1  -  -  -  -   Symptoms  SOB, usteady shoes, wobbly gait  none  none  none  none   Comments  walk test results  PM changed to 70  -  -  -   Duration  -  Progress to 45 minutes of aerobic exercise without signs/symptoms of physical distress  Continue with 45 min of aerobic exercise without signs/symptoms of physical distress.  Continue with 45 min of aerobic exercise without signs/symptoms of physical distress.  Continue with 45 min of aerobic exercise without signs/symptoms of physical distress.   Intensity  -  THRR unchanged  Other (comment) use  rpe  Other (comment)  Other (comment) use RPE     Progression   Progression  -  Continue to progress workloads to maintain intensity without signs/symptoms of physical distress.  Continue to progress workloads  to maintain intensity without signs/symptoms of physical distress.  Continue to progress workloads to maintain intensity without signs/symptoms of physical distress.  Continue to progress workloads to maintain intensity without signs/symptoms of physical distress.   Average METs  -  1.76  _1 Resistance Training   Training Prescription  -  Yes  Yes  Yes  Yes   Weight  -  3 lb  3 lb  3 lb  3 lb   Reps  -  10-15  10-15  10-15  10-15     Treadmill   MPH  -  1.3  -  1.3  -   Grade  -  0.5  -  0.5  -   Minutes  -  15  -  15  -   METs  -  2.09  -  2.08  -     NuStep   Level  -  1  -  3  3   SPM  -  80  -  80  80   Minutes  -  15  -  15  15   METs  -  1.7  -  2.2  -     Recumbant Elliptical   Level  -  1  -  1  1   RPM  -  50  -  50  50   Minutes  -  15  -  15  15   METs  -  1.5  -  1.7  1.7      Exercise Comments: Exercise Comments    Row Name 09/24/18 1633 10/23/18 1639         Exercise Comments  irst full day of exercise!  Patient was oriented to gym and equipment including functions, settings, policies, and procedures.  Patient's individual exercise prescription and treatment plan were reviewed.  All starting workloads were established based on the results of the 6 minute walk test done at initial orientation visit.  The plan for exercise progression was  also introduced and progression will be customized based on patient's performance and goals.  Reviewed home exercise with pt today.  Pt plans to walk for exercise.  Reviewed THR, pulse, RPE, sign and symptoms, NTG use, and when to call 911 or MD.  Also discussed weather considerations and indoor options.  Pt voiced understanding.         Exercise Goals and Review: Exercise Goals    Row Name 09/22/18 1452             Exercise Goals   Increase Physical Activity  Yes       Intervention  Provide advice, education, support and counseling about physical activity/exercise needs.;Develop an individualized exercise  prescription for aerobic and resistive training based on initial evaluation findings, risk stratification, comorbidities and participant's personal goals.       Expected Outcomes  Short Term: Attend rehab on a regular basis to increase amount of physical activity.;Long Term: Add in home exercise to make exercise part of routine and to increase amount of physical activity.;Long Term: Exercising regularly at least 3-5 days a week.       Increase Strength and Stamina  Yes       Intervention  Provide advice, education, support and counseling about physical activity/exercise needs.;Develop an individualized exercise prescription for aerobic and resistive training based on initial evaluation findings, risk stratification, comorbidities and participant's personal goals.       Expected Outcomes  Short Term: Increase workloads from initial exercise prescription for resistance, speed, and METs.;Short Term: Perform resistance training exercises routinely during rehab and add in resistance training at home;Long Term: Improve cardiorespiratory fitness, muscular endurance and strength as measured by increased METs and functional capacity (6MWT)       Able to understand and use rate of perceived exertion (RPE) scale  Yes       Intervention  Provide education and explanation on how to use RPE scale       Expected Outcomes  Short Term: Able to use RPE daily in rehab to express subjective intensity level;Long Term:  Able to use RPE to guide intensity level when exercising independently       Able to understand and use Dyspnea scale  Yes       Intervention  Provide education and explanation on how to use Dyspnea scale       Expected Outcomes  Short Term: Able to use Dyspnea scale daily in rehab to express subjective sense of shortness of breath during exertion;Long Term: Able to use Dyspnea scale to guide intensity level when exercising independently       Knowledge and understanding of Target Heart Rate Range (THRR)  Yes        Intervention  Provide education and explanation of THRR including how the numbers were predicted and where they are located for reference       Expected Outcomes  Short Term: Able to state/look up THRR;Short Term: Able to use daily as guideline for intensity in rehab;Long Term: Able to use THRR to govern intensity when exercising independently       Able to check pulse independently  Yes       Intervention  Provide education and demonstration on how to check pulse in carotid and radial arteries.;Review the importance of being able to check your own pulse for safety during independent exercise       Expected Outcomes  Short Term: Able to explain why pulse checking is important during independent exercise;Long Term: Able to check  pulse independently and accurately       Understanding of Exercise Prescription  Yes       Intervention  Provide education, explanation, and written materials on patient's individual exercise prescription       Expected Outcomes  Long Term: Able to explain home exercise prescription to exercise independently;Short Term: Able to explain program exercise prescription          Exercise Goals Re-Evaluation : Exercise Goals Re-Evaluation    Row Name 10/07/18 0950 10/22/18 1213 10/23/18 1639 11/05/18 1510 11/18/18 1159     Exercise Goal Re-Evaluation   Exercise Goals Review  Increase Physical Activity;Increase Strength and Stamina;Able to understand and use rate of perceived exertion (RPE) scale;Knowledge and understanding of Target Heart Rate Range (THRR);Understanding of Exercise Prescription  Increase Physical Activity;Increase Strength and Stamina;Able to understand and use rate of perceived exertion (RPE) scale;Knowledge and understanding of Target Heart Rate Range (THRR);Understanding of Exercise Prescription  Increase Physical Activity;Increase Strength and Stamina;Able to understand and use rate of perceived exertion (RPE) scale;Knowledge and understanding of Target  Heart Rate Range (THRR);Able to check pulse independently;Understanding of Exercise Prescription  Increase Physical Activity;Increase Strength and Stamina;Able to understand and use rate of perceived exertion (RPE) scale;Knowledge and understanding of Target Heart Rate Range (THRR)  Increase Physical Activity;Increase Strength and Stamina;Able to understand and use rate of perceived exertion (RPE) scale;Able to understand and use Dyspnea scale;Understanding of Exercise Prescription   Comments  [name] [name] has tolerated exercise well.  Staff will monitor progress.   [name] [name] is able to do 45 min of cardiovascular exercise.  He has improved MET level slightly.  Staff will monitor progress.  Reviewed home exercise with pt today.  Pt plans to walk for exercise.  Reviewed THR, pulse, RPE, sign and symptoms, NTG use, and when to call 911 or MD.  Also discussed weather considerations and indoor options.  Pt voiced understanding.  [name] [name] attends consistently and is up to level 3 on NS.  he reports RPE of 15-17 on TM so has not increased speed or grade yet.  Staff will monitor progress.  [name] [name] attends consistently and works in correct RPE range. He has a pacemaker that limits HR.     Expected Outcomes  Short - increase levels on NS and XR Long - improve overall MET level  Short - continue to attend consistently Long - improve MET level  Short - add one day walking when weather permits Long - exercise independently  Short - become more comfortable on TM Long - increase overall MET level  Short - work on reducing RPE on current level Long - increase levels on all machines      Discharge Exercise Prescription (Final Exercise Prescription Changes): Exercise Prescription Changes - 11/18/18 1100      Response to Exercise   Blood Pressure (Admit)  112/56    Blood Pressure (Exercise)  150/82    Blood Pressure (Exit)  130/80    Heart Rate (Admit)  66 bpm    Heart Rate (Exercise)  83 bpm    Heart Rate (Exit)  65 bpm    Rating of  Perceived Exertion (Exercise)  15    Symptoms  none    Duration  Continue with 45 min of aerobic exercise without signs/symptoms of physical distress.    Intensity  Other (comment)   use RPE     Progression   Progression  Continue to progress workloads to maintain intensity without signs/symptoms of physical distress.    Average  METs  2      Resistance Training   Training Prescription  Yes    Weight  3 lb    Reps  10-15      NuStep   Level  3    SPM  80    Minutes  15      Recumbant Elliptical   Level  1    RPM  50    Minutes  15    METs  1.7       Nutrition:  Target Goals: Understanding of nutrition guidelines, daily intake of sodium <1565m, cholesterol <2085m calories 30% from fat and 7% or less from saturated fats, daily to have 5 or more servings of fruits and vegetables.  Biometrics: Pre Biometrics - 09/22/18 1453      Pre Biometrics   Height  5' 8.4" (1.737 m)    Weight  186 lb 14.4 oz (84.8 kg)    Waist Circumference  40 inches    Hip Circumference  40.5 inches    Waist to Hip Ratio  0.99 %    BMI (Calculated)  28.1    Single Leg Stand  15.44 seconds      Post Biometrics - 11/26/18 1731       Post  Biometrics   Height  5' 8.4" (1.737 m)    Weight  194 lb 8 oz (88.2 kg)    Waist Circumference  40 inches    Hip Circumference  40.5 inches    Waist to Hip Ratio  0.99 %    BMI (Calculated)  29.24       Nutrition Therapy Plan and Nutrition Goals: Nutrition Therapy & Goals - 09/25/18 1044      Nutrition Therapy   Diet  DM    Drug/Food Interactions  Coumadin/Vit K    Protein (specify units)  10oz    Fiber  30 grams    Whole Grain Foods  3 servings   eats white/wheat bread    Saturated Fats  13 max. grams    Fruits and Vegetables  5 servings/day   8 ideal; eats small portions but does try to eat fruits and vegetables daily   Sodium  2000 grams      Personal Nutrition Goals   Nutrition Goal  Drink one additional glass of water per day     Personal Goal #2  Use 1/2 of a cup as as guide when portioning out foods that contain Vitamin K so that you eat the same amounts each time    Personal Goal #3  Start to think about ways to decrease sodium in your diet    Comments  His wife is responsible for cooking and providing most food for pt. His appetite is improving post surgery and wt is returning to UBW range after losing approx 8# per wife's estimation. (06/2018) HgbA1c 5.5. Breakfast: almond honey bunches of oats with 2% milk + dried cherries or other fruit, sometimes a biscuit. Lunch: "main meal" baked potato, beans, chili beans, ham or egg salad sandwich. Dinner: soup, sandwich, nuts, cheese, mini bagel with cream cheese, casserol occasionlly (prepared by daughter). Snacks: sweets, pretzels, chocolate. They do not eat many fried foods and he is trying to cut down on sweets.      Intervention Plan   Intervention  Prescribe, educate and counsel regarding individualized specific dietary modifications aiming towards targeted core components such as weight, hypertension, lipid management, diabetes, heart failure and other comorbidities.    Expected Outcomes  Short Term Goal: Understand basic principles of dietary content, such as calories, fat, sodium, cholesterol and nutrients.;Long Term Goal: Adherence to prescribed nutrition plan.;Short Term Goal: A plan has been developed with personal nutrition goals set during dietitian appointment.       Nutrition Assessments: Nutrition Assessments - 09/22/18 1341      MEDFICTS Scores   Pre Score  47       Nutrition Goals Re-Evaluation: Nutrition Goals Re-Evaluation    Row Name 09/25/18 1104 11/24/18 1629 12/01/18 1545         Goals   Nutrition Goal  Use 1/2 of a cup as as guide when portioning out foods that contain Vitamin K so that you eat the same amounts each time  [name] [name] is taking a lower dose of coumadin than before.  He is aware that greens affect his INR.  Tip drinks a glass of water with  meds in the morning.  He does drink soda that is sugar free and caffeine free.    -     Comment  He is on Coumadin and is having trouble regularing his INR  -  In the hospital now and they will check his INR.     Expected Outcome  He will find an eating schedule for and choose regulated portions of foods that are high in Vitamin K  Short - continue to drink water in the morning Long - maintain drinking water and hydration  -       Personal Goal #2 Re-Evaluation   Personal Goal #2  Drink one additional glass of water per day  -  -       Personal Goal #3 Re-Evaluation   Personal Goal #3  Start to think about ways to decrease sodium in your diet  -  Will try to decrease his sodium        Nutrition Goals Discharge (Final Nutrition Goals Re-Evaluation): Nutrition Goals Re-Evaluation - 12/01/18 1545      Goals   Comment  In the hospital now and they will check his INR.      Personal Goal #3 Re-Evaluation   Personal Goal #3  Will try to decrease his sodium       Psychosocial: Target Goals: Acknowledge presence or absence of significant depression and/or stress, maximize coping skills, provide positive support system. Participant is able to verbalize types and ability to use techniques and skills needed for reducing stress and depression.   Initial Review & Psychosocial Screening: Initial Psych Review & Screening - 09/22/18 1338      Initial Review   Current issues with  Current Anxiety/Panic;Current Stress Concerns   current health issues   Source of Stress Concerns  Financial;Family;Unable to participate in former interests or hobbies;Unable to perform yard/household activities      [address]?  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       Screening Interventions   Interventions  Encouraged to exercise;Program counselor consult;To provide support and resources with identified psychosocial needs;Provide feedback about the scores to participant    Expected Outcomes  Short Term goal: Utilizing psychosocial counselor, staff and physician to assist with identification of specific Stressors or current issues interfering with healing process. Setting desired goal for each stressor or current issue identified.;Long  Term Goal: Stressors or current issues are controlled or eliminated.;Short Term goal: Identification and review with participant of any Quality of Life or Depression concerns found by scoring the questionnaire.;Long Term goal: The participant improves quality of Life and PHQ9 Scores as seen by post scores and/or verbalization of changes       Quality of Life Scores:  Quality of Life - 09/22/18 1340      Quality of Life   Select  Quality of Life      Quality of Life Scores   Health/Function Pre  23.14 %    Socioeconomic Pre  26.36 %    Psych/Spiritual Pre  28.29 %    Family Pre  30 %    GLOBAL Pre  28.95 %      Scores of 19 and below usually indicate a poorer quality of life in these areas.  A difference of  2-3 points is a clinically meaningful difference.  A difference of 2-3 points in the total score of the Quality of Life Index has been associated with significant improvement in overall quality of life, self-image, physical symptoms, and general health in studies assessing change in quality of life.  PHQ-9: Recent Review Flowsheet Data    Depression screen [facility] 2/9 09/22/2018 07/08/2018 11/11/2017 11/11/2017 10/30/2016   Decreased Interest 0 0 0 0 0   Down, Depressed, Hopeless 0 0 0 0 0   PHQ - 2 Score 0 0 0 0 0   Altered sleeping 0 - 0 - -   Tired, decreased energy 1 - 0 - -   Change in appetite 0 - 0 - -   Feeling bad or failure about yourself  0 - 0 - -   Trouble concentrating 0 - 0 - -   Moving slowly or  fidgety/restless 0 - 0 - -   Suicidal thoughts 0 - 0 - -   PHQ-9 Score 1 - 0 - -   Difficult doing work/chores Not difficult at all - Not difficult at all - -     Interpretation of Total Score  Total Score Depression Severity:  1-4 = Minimal depression, 5-9 = Mild depression, 10-14 = Moderate depression, 15-19 = Moderately severe depression, 20-27 = Severe depression   Psychosocial Evaluation and Intervention: Psychosocial Evaluation - 10/20/18 1702      Psychosocial Evaluation & Interventions   Interventions  Stress management education;Relaxation education;Encouraged to exercise with the program and follow exercise prescription    Comments  Counselor met with Mr. Malloy 11/17/2022 - pronounced "Krista Blue") today for initial psychosocial evaluation.  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  He has a strong support system with a spouse of 91 years and (3) adult children between he and his spouse.  He also is actively involved in his local church  2022-11-17 reports being in fairly good health; sleeps well and has a good appetite most of the time.  He denies a history of depression or anxiety other than situationally when he was going into surgery in August.  But reports at this time he is typically positive and rarely anxious.  He does have some stress in his life with his own health and some "problems dealing with the grandkid's problems!"  He has goals to get back into condition so he can more easily "continue walking and shopping at [facility]."  11-17-2022 will be followed by staff while  in this program.      Expected Outcomes  Short:  [name] [name] will exercise for his health and for stress management. He will attend the psychoeducational component on stress and anxiety while in this program.    Long:  [name] [name] will continue to make positive self-care choices by exercising and use of stress management strategies.[name]       Psychosocial  Re-Evaluation: Psychosocial Re-Evaluation    Row Name 10/29/18 1720 11/24/18 1632 12/01/18 1544         Psychosocial Re-Evaluation   Current issues with  None Identified  Current Stress Concerns  Current Stress Concerns     Comments  Counselor follow up with [name] [name] today reporting he will be out of the office next week on vacation with his spouse at [facility].  He was encouraged to continue exercising consistently while out of the program.  He was struggling to work out today but was committed to be here and has a good positive attitude.  He will be followed upon his return.  [name] [name] says his only concerns are financial and they are refinancing the house.  This is manageable for him.  He does the financial work by hand and when he leaves it, he doesnt worry about it.  In the hospital now. Got dizzy in [facility] while he was on vacation celebrating his birthday. We will graduate him early since he was suppose to graduate this evening.      Expected Outcomes  Short:  [name] [name] will continue to exercise while on vacation next week.  Long:  Abigail will focus on his goals upon return and continue to exercise consistently.  Short - continue to use coping mechanisms to manage stress Long - maintain low stress levels  -     Continue Psychosocial Services   -  -  No Follow up required        Psychosocial Discharge (Final Psychosocial Re-Evaluation): Psychosocial Re-Evaluation - 12/01/18 1544      Psychosocial Re-Evaluation   Current issues with  Current Stress Concerns    Comments  In the hospital now. Got dizzy in [facility] while he was on vacation celebrating his birthday. We will graduate him early since he was suppose to graduate this evening.     Continue Psychosocial Services   No Follow up required       Vocational Rehabilitation: Provide vocational rehab assistance to qualifying candidates.   Vocational Rehab Evaluation & Intervention: Vocational Rehab - 09/22/18 1307      Initial Vocational Rehab Evaluation &  Intervention   Assessment shows need for Vocational Rehabilitation  No       Education: Education Goals: Education classes will be provided on a variety of topics geared toward better understanding of heart health and risk factor modification. Participant will state understanding/return demonstration of topics presented as noted by education test scores.  Learning Barriers/Preferences: Learning Barriers/Preferences - 09/22/18 1306      Learning Barriers/Preferences   Learning Barriers  Hearing    Learning Preferences  None       Education Topics:  AED/CPR: - Group verbal and written instruction with the use of models to demonstrate the basic use of the AED with the basic ABC's of resuscitation.   Cardiac Rehab from 11/26/2018 in [facility] Cardiac and Pulmonary Rehab  Date  10/13/18  Educator  CE  Instruction Review Code  1- Verbalizes Understanding      General Nutrition Guidelines/Fats and Fiber: -Group instruction provided by verbal, written material, models and  posters to present the general guidelines for heart healthy nutrition. Gives an explanation and review of dietary fats and fiber.   Cardiac Rehab from 11/26/2018 in [facility] Cardiac and Pulmonary Rehab  Date  10/27/18  Educator  LB  Instruction Review Code  1- Verbalizes Understanding      Controlling Sodium/Reading Food Labels: -Group verbal and written material supporting the discussion of sodium use in heart healthy nutrition. Review and explanation with models, verbal and written materials for utilization of the food label.   Cardiac Rehab from 11/26/2018 in [facility] Cardiac and Pulmonary Rehab  Date  10/29/18  Educator  LB  Instruction Review Code  1- Verbalizes Understanding      Exercise Physiology & General Exercise Guidelines: - Group verbal and written instruction with models to review the exercise physiology of the cardiovascular system and associated critical values. Provides general exercise guidelines with  specific guidelines to those with heart or lung disease.    Aerobic Exercise & Resistance Training: - Gives group verbal and written instruction on the various components of exercise. Focuses on aerobic and resistive training programs and the benefits of this training and how to safely progress through these programs..   Flexibility, Balance, Mind/Body Relaxation: Provides group verbal/written instruction on the benefits of flexibility and balance training, including mind/body exercise modes such as yoga, pilates and tai chi.  Demonstration and skill practice provided.   Cardiac Rehab from 11/26/2018 in [facility] Cardiac and Pulmonary Rehab  Date  11/10/18  Educator  AS  Instruction Review Code  1- Verbalizes Understanding      Stress and Anxiety: - Provides group verbal and written instruction about the health risks of elevated stress and causes of high stress.  Discuss the correlation between heart/lung disease and anxiety and treatment options. Review healthy ways to manage with stress and anxiety.   Depression: - Provides group verbal and written instruction on the correlation between heart/lung disease and depressed mood, treatment options, and the stigmas associated with seeking treatment.   Cardiac Rehab from 11/26/2018 in [facility] Cardiac and Pulmonary Rehab  Date  10/22/18  Educator  Lucianne Lei, MSW  Instruction Review Code  2- Demonstrated Understanding      Anatomy & Physiology of the Heart: - Group verbal and written instruction and models provide basic cardiac anatomy and physiology, with the coronary electrical and arterial systems. Review of Valvular disease and Heart Failure   Cardiac Rehab from 11/26/2018 in [facility] Cardiac and Pulmonary Rehab  Date  11/17/18  Educator  CE  Instruction Review Code  1- Verbalizes Understanding      Cardiac Procedures: - Group verbal and written instruction to review commonly prescribed medications for heart disease. Reviews the medication,  class of the drug, and side effects. Includes the steps to properly store meds and maintain the prescription regimen. (beta blockers and nitrates)   Cardiac Medications I: - Group verbal and written instruction to review commonly prescribed medications for heart disease. Reviews the medication, class of the drug, and side effects. Includes the steps to properly store meds and maintain the prescription regimen.   Cardiac Rehab from 11/26/2018 in [facility] Cardiac and Pulmonary Rehab  Date  11/24/18  Educator  CE  Instruction Review Code  1- Verbalizes Understanding      Cardiac Medications II: -Group verbal and written instruction to review commonly prescribed medications for heart disease. Reviews the medication, class of the drug, and side effects. (all other drug classes)   Cardiac Rehab from 11/26/2018 in [facility]  Cardiac and Pulmonary Rehab  Date  11/12/18  Educator  [facility]  Instruction Review Code  1- Verbalizes Understanding       Go Sex-Intimacy & Heart Disease, Get SMART - Goal Setting: - Group verbal and written instruction through game format to discuss heart disease and the return to sexual intimacy. Provides group verbal and written material to discuss and apply goal setting through the application of the S.M.A.R.T. Method.   Other Matters of the Heart: - Provides group verbal, written materials and models to describe Stable Angina and Peripheral Artery. Includes description of the disease process and treatment options available to the cardiac patient.   Exercise & Equipment Safety: - Individual verbal instruction and demonstration of equipment use and safety with use of the equipment.   Cardiac Rehab from 11/26/2018 in [facility] Cardiac and Pulmonary Rehab  Date  09/22/18  Educator  [facility]  Instruction Review Code  1- Verbalizes Understanding      Infection Prevention: - Provides verbal and written material to individual with discussion of infection control including proper hand washing  and proper equipment cleaning during exercise session.   Cardiac Rehab from 11/26/2018 in [facility] Cardiac and Pulmonary Rehab  Date  09/22/18  Educator  [facility]  Instruction Review Code  1- Verbalizes Understanding      Falls Prevention: - Provides verbal and written material to individual with discussion of falls prevention and safety.   Cardiac Rehab from 11/26/2018 in [facility] Cardiac and Pulmonary Rehab  Date  09/22/18  Educator  [facility]  Instruction Review Code  1- Verbalizes Understanding      Diabetes: - Individual verbal and written instruction to review signs/symptoms of diabetes, desired ranges of glucose level fasting, after meals and with exercise. Acknowledge that pre and post exercise glucose checks will be done for 3 sessions at entry of program.   Cardiac Rehab from 11/26/2018 in [facility] Cardiac and Pulmonary Rehab  Date  09/22/18  Educator  [facility]  Instruction Review Code  1- Verbalizes Understanding      Know Your Numbers and Risk Factors: -Group verbal and written instruction about important numbers in your health.  Discussion of what are risk factors and how they play a role in the disease process.  Review of Cholesterol, Blood Pressure, Diabetes, and BMI and the role they play in your overall health.   Cardiac Rehab from 11/26/2018 in [facility] Cardiac and Pulmonary Rehab  Date  11/12/18  Educator  [facility]  Instruction Review Code  1- Verbalizes Understanding      Sleep Hygiene: -Provides group verbal and written instruction about how sleep can affect your health.  Define sleep hygiene, discuss sleep cycles and impact of sleep habits. Review good sleep hygiene tips.    Cardiac Rehab from 11/26/2018 in [facility] Cardiac and Pulmonary Rehab  Date  11/19/18  Educator  Lucianne Lei, MSW  Instruction Review Code  1- Verbalizes Understanding      Other: -Provides group and verbal instruction on various topics (see comments)   Knowledge Questionnaire Score: Knowledge Questionnaire Score -  09/22/18 1306      Knowledge Questionnaire Score   Pre Score  21/25   correct answers reviewed with [name] [name]. Focus on anatomy, nutrition, exercise      Core Components/Risk Factors/Patient Goals at Admission: Personal Goals and Risk Factors at Admission - 09/22/18 1305      Core Components/Risk Factors/Patient Goals on Admission    Weight Management  Yes;Weight Maintenance    Intervention  Weight Management: Develop a  combined nutrition and exercise program designed to reach desired caloric intake, while maintaining appropriate intake of nutrient and fiber, sodium and fats, and appropriate energy expenditure required for the weight goal.;Weight Management: Provide education and appropriate resources to help participant work on and attain dietary goals.    Admit Weight  185 lb (83.9 kg)    Expected Outcomes  Short Term: Continue to assess and modify interventions until short term weight is achieved;Long Term: Adherence to nutrition and physical activity/exercise program aimed toward attainment of established weight goal;Weight Maintenance: Understanding of the daily nutrition guidelines, which includes 25-35% calories from fat, 7% or less cal from saturated fats, less than 232m cholesterol, less than 1.5gm of sodium, & 5 or more servings of fruits and vegetables daily;Understanding recommendations for meals to include 15-35% energy as protein, 25-35% energy from fat, 35-60% energy from carbohydrates, less than 2086mof dietary cholesterol, 20-35 gm of total fiber daily;Understanding of distribution of calorie intake throughout the day with the consumption of 4-5 meals/snacks    Diabetes  Yes    Intervention  Provide education about signs/symptoms and action to take for hypo/hyperglycemia.;Provide education about proper nutrition, including hydration, and aerobic/resistive exercise prescription along with prescribed medications to achieve blood glucose in normal ranges: Fasting glucose 65-99 mg/dL     Expected Outcomes  Short Term: Participant verbalizes understanding of the signs/symptoms and immediate care of hyper/hypoglycemia, proper foot care and importance of medication, aerobic/resistive exercise and nutrition plan for blood glucose control.;Long Term: Attainment of HbA1C < 7%.    Lipids  Yes    Intervention  Provide education and support for participant on nutrition & aerobic/resistive exercise along with prescribed medications to achieve LDL <7072mHDL >69m79m  Expected Outcomes  Short Term: Participant states understanding of desired cholesterol values and is compliant with medications prescribed. Participant is following exercise prescription and nutrition guidelines.;Long Term: Cholesterol controlled with medications as prescribed, with individualized exercise RX and with personalized nutrition plan. Value goals: LDL < 70mg76mL > 40 mg.       Core Components/Risk Factors/Patient Goals Review:  Goals and Risk Factor Review    Row Name 10/23/18 1640 11/24/18 1623 12/01/18 1545         Core Components/Risk Factors/Patient Goals Review   Personal Goals Review  Weight Management/Obesity;Improve shortness of breath with ADL's;Diabetes;Hypertension;Lipids  Weight Management/Obesity;Improve shortness of breath with ADL's;Diabetes;Hypertension;Lipids  Weight Management/Obesity     Review  [name] [name] is taking meds as directed.  He has a BP cuff at home but doesnt use it.  His BG at home has been 130-140 ( he admits sneaking chocolate to eat)  He has noticed he can carry full load of laundry and groceries easier than before starting HT.  [name] [name] is taking all meds as directed.  He will start checking BP at [facility] at least on [facility] is still [facility].  [name] [name] had A1c 3 months ago and his Dr was satisfied with it.  He still admits eating chocolate and sweets"more than I should."    His blood sugar is doing good and being checked since he is in the hospital.      Expected Outcomes   Short - check BP at home once a day Long - monitor BP, BG at home as well as follow Dr advice to achieve optimal level  Short - [facility] - He plans to join a gym wih his wife after graduation  Brelan still said today that he hopes to return to the [employer] with his wife when he gets better and out of the hospital.        Core Components/Risk Factors/Patient Goals at Discharge (Final Review):  Goals and Risk Factor Review - 12/01/18 1545      Core Components/Risk Factors/Patient Goals Review   Personal Goals Review  Weight Management/Obesity    Review  His blood sugar is doing good and being checked since he is in the hospital.     Expected Outcomes  [name] [name] still said today that he hopes to return to the [employer] with his wife when he gets better and out of the hospital.       ITP Comments: ITP Comments    Row Name 09/22/18 1301 10/06/18 1649 10/14/18 0641 11/12/18 1400 12/10/18 0646   ITP Comments  Med Review completed. Initial ITP created. Diagnosis can be found in [facility] 8/19  [name] [name] reports his PM has changed to on demand at 70 bpm  30 Day Review. Continue with ITP unless directed changes per Medical Director review  30 day review completed. ITP sent to Dr. Emily Filbert, Medical Director of Cardiac Rehab. Continue with ITP unless changes are made by physician.  Discharged      Comments:

## 2018-12-10 NOTE — Assessment & Plan Note (Addendum)
#   Mediastinal LN/right supraclavicular adenopathy 2 to 4 cm in size; mildly FDG avid; slightly increased in size/stable compared to CT scan from December 2018.  #The differential diagnosis includes low-grade lymphoproliferative neoplasm/versus benign reactive causes.  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  Had a long discussion regarding option biopsy versus watch/and wait.  As the patient is asymptomatic/overall low uptake on PET scan/overall slight growth compared to CT imaging in December 2018-I think it is reasonable to monitor with repeat imaging in approximately 6 months.  Also discussed that low-grade lymphoproliferative neoplasm did not be treated upfront as long patient is asymptomatic.  We will also review the tumor conference.  #Mild transverse colonic uptake-colonoscopy more than 10 years ago.  Given the vestibular neuritis/Coumadin-would recommend holding off immediate colonoscopy.  This could be addressed when acute issues resolve.  #Cirrhosis-currently compensated.  Unclear etiology. Discussed multiple etiologies of cirrhosis include fatty liver/infectious causes etc.  Recommend evaluation with GI.  Defer to PCP for referral.  # Dizyness/vestibular neuritis-followed by Dr. Pryor Ochoa ENT  # DISPOSITION: # in  6 months- MD/labs/PET prior- Dr.B  # I reviewed the blood work- with the patient in detail; also reviewed the imaging independently [as summarized above]; and with the patient in detail.

## 2018-12-10 NOTE — Progress Notes (Signed)
[place] NOTE  Patient Care Team: [name], MD as PCP - General (Unknown Physician Specialty) Wellington Hampshire, MD as PCP - Cardiology (Cardiology) Margaretha Sheffield, MD (Otolaryngology) Leandrew Koyanagi, MD as Referring Physician (Ophthalmology) Wellington Hampshire, MD as Consulting Physician (Cardiology) Sharlotte Alamo, DPM as Consulting Physician (Podiatry) Bernardo Heater, Ronda Fairly, MD as Consulting Physician (Urology) Carmon Ginsberg, [place] as Referring Physician (Family Medicine) Wilhelmina Mcardle, MD as Consulting Physician (Pulmonary Disease) Jannet Mantis, MD as Consulting Physician (Dermatology) Telford Nab, RN as Registered Nurse  CHIEF COMPLAINTS/PURPOSE OF CONSULTATION:  Mediastinal adenopathy  #  Oncology History   #February 2020 gastroenteropathy/right supraclavicular adenopathy-incidental]-PET scan 2020-low-grade FDG activity-differential low-grade lymphoma versus benign reactive process. no biopsy  #Right kidney-cancer status post cryo [[facility]]  #CAD status post CABG; mechanical valve replacement [2019 August; [facility]]; on Coumadin     Cancer of right kidney ([place])   12/10/2018 Initial Diagnosis    Cancer of right kidney [facility])     HISTORY OF PRESENTING ILLNESS:  [name] [name] 85 y.o.  male is here for follow-up after his recent admission to the hospital for dizziness/incidentally noted to have mediastinal adenopathy; subcentimeter bilateral lung nodules.  Since discharge in the hospital patient had followed up with ENT-diagnosed with vestibular neuritis.  Patient currently getting steroid injections.   He continues to be intermittently dizzy.  Otherwise denies night sweats fevers or weight loss.   Review of Systems  Constitutional: Positive for malaise/fatigue. Negative for chills, diaphoresis, fever and weight loss.  HENT: Negative for nosebleeds and sore throat.   Eyes: Negative for double vision.  Respiratory: Negative for  cough, hemoptysis, sputum production, shortness of breath and wheezing.   Cardiovascular: Negative for chest pain, palpitations, orthopnea and leg swelling.  Gastrointestinal: Negative for abdominal pain, blood in stool, constipation, diarrhea, heartburn, melena, nausea and vomiting.  Genitourinary: Negative for dysuria, frequency and urgency.  Musculoskeletal: Negative for back pain and joint pain.  Skin: Negative.  Negative for itching and rash.  Neurological: Positive for dizziness. Negative for tingling, focal weakness, weakness and headaches.  Endo/Heme/Allergies: Does not bruise/bleed easily.  Psychiatric/Behavioral: Negative for depression. The patient is not nervous/anxious and does not have insomnia.      MEDICAL HISTORY:  Past Medical History:  Diagnosis Date  . Aortic stenosis    a. 01/2016 echo: mod AS; b. 04/2018 Cardiac MRI: sev AS; c. 05/2018 s/p mech AVR @ time of myomectomy; d. 07/2018 Echo: Mild AS/AI.  [name] Atherosclerosis of aorta ([place])    by CT scan in past  . Atrial fibrillation ([place])    and aflutter. pt has a left atrial circuit that is not ablated. was on amiodarone-stopped, now use rate control.   . Bladder cancer ([place])    hx; treated with BCG in past   . Carotid artery disease ([place])    There was calcified plaque but no stenosis by carotid artery screening done at [facility] October, 2013  . CHB (complete heart block) ([place])    a. 05/2018 s/p BSX VVIR PPM ([facility]) in setting of bradycardia following myomectomy and AVR/MVR.  . Diabetes mellitus    type not specified  . Diabetic neuropathy ([facility])    feet  . Elevated liver enzymes    over time; hx  . Excessive sweating   . Glaucoma   . Gout   . Head injury    when slipped on ice 2004-2005. stabilized and back on Coumadin   . Headache  migraines - distant past  . History of cardiac cath    a. 05/2018 Cath ([facility]): Non-obstructive CAD. Mildly elevated filling pressures w/ nl CI.  [name] HOCM (hypertrophic  obstructive cardiomyopathy) ([place])    a. 01/2016 Echo: EF 65-70%, no rwma, LVOT gradient of 80-34mmHg, mod AS, SAM; b. 11/2017 Echo: EF 55-60%, near cavity obliteration in systole, mod AS, mild MS; c. 04/2018 Card MRI ([facility]): Sev LVH, EF >70%, Sev AS, LVOT obs w/ Sev MR, mild to mod TR/PR, mid-myocardial HE in basal-mid anteroseptum and inferoseptum; d. 05/2018 s/p Septal myomectomy; e. 07/2018 Echo: EF 60-65%. PASP 76mmHg.  [name] Homocystinemia ([place])    elevated, mild   . Hypercholesterolemia    treated.   . Hypertension   . Kidney mass    a. s/p laproscopic surgery woth cryoablation of a mass outside kidney followed at [facility]; b. 11/2018 CT Chest: 2cm R kidney mass.  . Mediastinal adenopathy    a. 12/208 CT Chest: up to 70mm LLL lung nodule. 1.5-1.7cm subcarinal/mediastinal adenopahty/right paratracheal lymph node; b. 11/2018 CT Chest: 2.5-3cm mediastinal adenopathy.  . Mitral regurgitation    a. 04/2018 Cardiac MRI: sev MR; b. 05/2018 s/p mech MVR @ time of myomectomy; c. 07/2018 Echo: Mild MS.  . Motion sickness    ocean boats  . Nausea   . Nonobstructive coronary artery disease    a. 05/2018 Cath ([facility]): nonobs CAD.  [name] Obstructive sleep apnea    CPAP started successfully 2014  . Orthostatic hypotension    a. orthostatic. dehydration. hospitalized 11/11  . Sleep apnea    Significant obstructive sleep apnea diagnosed in August, 2012, the patient is to receive CPAP   . Stroke [facility])    "2 old strokes" CT and MRI. [facility] 11/11. diagnosis was dehydration, no acutal reports.   . TSH elevation    on synthroid historically   . Unsteady gait    August, 2012    SURGICAL HISTORY: Past Surgical History:  Procedure Laterality Date  . BLADDER SURGERY    . CATARACT EXTRACTION W/PHACO Left 05/11/2015   Procedure: CATARACT EXTRACTION PHACO AND INTRAOCULAR LENS PLACEMENT (IOC);  Surgeon: Leandrew Koyanagi, MD;  Location: [place];  Service: Ophthalmology;  Laterality: Left;  DIABETIC -  oral meds, CPAP  . ESOPHAGOGASTRODUODENOSCOPY (EGD) WITH PROPOFOL N/A 07/01/2018   Procedure: ESOPHAGOGASTRODUODENOSCOPY (EGD) WITH PROPOFOL;  Surgeon: [name], MD;  Location: [facility] ENDOSCOPY;  Service: Endoscopy;  Laterality: N/A;  . INSERT / REPLACE / REMOVE PACEMAKER     Chemical engineer   . KIDNEY SURGERY     "froze mass"  . MECHANICAL AORTIC AND MITRAL VALVE REPLACEMENT  05/2018   [facility]   . TONSILLECTOMY    . VALVE REPLACEMENT      SOCIAL HISTORY: Social History   Socioeconomic History  . Marital status: Married    Spouse name: Jasmine Awe  . Number of children: 1  . Years of education: 25  . Highest education level: Bachelor's degree (e.g., BA, AB, BS)  Occupational History  . Occupation: retired  Scientific laboratory technician  . Financial resource strain: Not hard at all  . Food insecurity:    Worry: Never true    Inability: Never true  . Transportation needs:    Medical: No    Non-medical: No  Tobacco Use  . Smoking status: Former Smoker    Types: Cigars, Cigarettes    Last attempt to quit: 10/02/1974    Years since quitting: 44.2  . Smokeless tobacco: Never Used  .  Tobacco comment: still smokes cigars once a day  Substance and Sexual Activity  . Alcohol use: No    Frequency: Never  . Drug use: No  . Sexual activity: Yes  Lifestyle  . Physical activity:    Days per week: 0 days    Minutes per session: 0 min  . Stress: Not at all  Relationships  . Social connections:    Talks on phone: More than three times a week    Gets together: More than three times a week    Attends religious service: More than 4 times per year    Active member of club or organization: Yes    Attends meetings of clubs or organizations: More than 4 times per year    Relationship status: Married  . Intimate partner violence:    Fear of current or ex partner: No    Emotionally abused: No    Physically abused: No    Forced sexual activity: No  Other Topics Concern  . Not on file  Social History  Narrative   Married, retired, gets regular exercise.     FAMILY HISTORY: Family History  Problem Relation Age of Onset  . Arrhythmia Father        A-Fib  . Prostate cancer Father   . Stroke Father   . Breast cancer Mother   . Arrhythmia Brother        A-Fib  . Heart attack Neg Hx   . Hypertension Neg Hx     ALLERGIES:  is allergic to macrolides and ketolides; mycinettes; nitrofuran derivatives; nitrofurantoin; erythromycin; zofran [ondansetron hcl-dextrose]; and zofran [ondansetron hcl].  MEDICATIONS:  Current Outpatient Medications  Medication Sig Dispense Refill  . acetaminophen (TYLENOL) 325 MG tablet Take 650 mg by mouth every 6 (six) hours as needed.    [name] aspirin EC 81 MG tablet Take 1 tablet (81 mg total) by mouth daily.    [name] glucose blood (ONE TOUCH ULTRA TEST) test strip USE ONE STRIP TO CHECK GLUCOSE ONCE DAILY 100 each 12  . latanoprost (XALATAN) 0.005 % ophthalmic solution Place 1 drop into both eyes at bedtime.     [name] levothyroxine (SYNTHROID, LEVOTHROID) 50 MCG tablet TAKE 1 TABLET BY MOUTH ONCE DAILY 90 tablet 3  . metFORMIN (GLUCOPHAGE) 1000 MG tablet Take 1 tablet (1,000 mg total) by mouth daily with breakfast. 90 tablet 3  . metoprolol tartrate (LOPRESSOR) 25 MG tablet Take 12.5 mg by mouth 2 (two) times daily.    . ONE TOUCH ULTRA TEST test strip USE TO TEST BLOOD SUGAR ONCE DAILY 50 each 25  . ONETOUCH DELICA LANCETS 40J MISC USE ONE LANCET TO CHECK BLOOD GLUCOSE ONCE DAILY 100 each 12  . ONETOUCH DELICA LANCETS 81X MISC USE TO TEST BLOOD SUGAR ONCE DAILY 100 each 12  . pantoprazole (PROTONIX) 40 MG tablet Take 1 tablet (40 mg total) by mouth 2 (two) times daily. (Patient taking differently: Take 40 mg by mouth daily. ) 60 tablet 3  . simvastatin (ZOCOR) 40 MG tablet TAKE 1/2 (ONE-HALF) TABLET BY MOUTH AT BEDTIME 45 tablet 3  . warfarin (COUMADIN) 3 MG tablet Take 1.5 to 2 tablets daily as directed by Coumadin clinic 40 tablet 0   No current  facility-administered medications for this visit.       [name]  PHYSICAL EXAMINATION: ECOG PERFORMANCE STATUS: 0 - Asymptomatic  Vitals:   12/10/18 1442  BP: (!) 162/70  Pulse: 97  Temp: 97.9 F (36.6 C)   Filed Weights  12/10/18 1442  Weight: 191 lb 12.8 oz (87 kg)    Physical Exam  Constitutional: He is oriented to person, place, and time and well-developed, well-nourished, and in no distress.  Patient is wheelchair.  Accompanied by multiple members.  HENT:  Head: Normocephalic and atraumatic.  Mouth/Throat: Oropharynx is clear and moist. No oropharyngeal exudate.  Eyes: Pupils are equal, round, and reactive to light.  Neck: Normal range of motion. Neck supple.  Cardiovascular: Normal rate and regular rhythm.  Pulmonary/Chest: Effort normal and breath sounds normal. No respiratory distress. He has no wheezes.  Abdominal: Soft. Bowel sounds are normal. He exhibits no distension and no mass. There is no abdominal tenderness. There is no rebound and no guarding.  Musculoskeletal: Normal range of motion.        General: No tenderness or edema.  Neurological: He is alert and oriented to person, place, and time.  Skin: Skin is warm.  Psychiatric: Affect normal.     LABORATORY DATA:  I have reviewed the data as listed Lab Results  Component Value Date   WBC 8.8 12/02/2018   HGB 12.5 (L) 12/02/2018   HCT 38.2 (L) 12/02/2018   MCV 92.0 12/02/2018   PLT 228 12/02/2018   Recent Labs    06/30/18 1000  07/10/18 1229 07/29/18 1526 12/01/18 0045  NA 135   < > 139 137 137  K 3.8   < > 3.8 4.7 3.7  CL 101   < > 102 99 102  CO2 25   < > 21 20 26   GLUCOSE 152*   < > 120* 150* 122*  BUN 15   < > 8 13 20   CREATININE 0.88   < > 0.80 1.01 1.06  CALCIUM 8.8*   < > 9.2 9.5 9.0  GFRNONAA >60   < > 83 69 >60  GFRAA >60   < > 96 80 >60  PROT 7.8  --  7.2  --  8.0  ALBUMIN 3.3*  --  3.6  --  3.7  AST 48*  --  22  --  25  ALT 36  --  9  --  13  ALKPHOS 81  --  85  --  58   BILITOT 0.5  --  0.3  --  1.0   < > = values in this interval not displayed.    RADIOGRAPHIC STUDIES: I have personally reviewed the radiological images as listed and agreed with the findings in the report. Nm Pet Image Initial (pi) Skull Base To Thigh  Result Date: 12/09/2018 CLINICAL DATA:  Initial treatment strategy for mediastinal lymphadenopathy. History of bladder cancer and renal cell carcinoma. EXAM: NUCLEAR MEDICINE PET SKULL BASE TO THIGH TECHNIQUE: 9.8 mCi F-18 FDG was injected intravenously. Full-ring PET imaging was performed from the skull base to thigh after the radiotracer. CT data was obtained and used for attenuation correction and anatomic localization. Fasting blood glucose: 103 mg/dl COMPARISON:  Chest CT from 11/27/2018 FINDINGS: Mediastinal blood pool activity: SUV max 2.8 Background hepatic activity: SUV max 4.4 NECK: A right level IV lymph node measuring 1.2 cm in short axis on image 62/3 has a maximum SUV of 3.4. Incidental CT findings: There is potentially acute right frontal sinusitis with chronic right anterior ethmoid sinusitis and chronic bilateral maxillary sinusitis. Bilateral common carotid atherosclerotic calcification. CHEST: Conglomerate right paratracheal adenopathy measuring 2.0 cm in short axis on image 83/3 has maximum SUV 3.9. An AP window lymph node measuring 1.3 cm in short axis  on image 84/3 has maximum SUV of 3.9. A right hilar lymph node measuring 1.1 cm in short axis on image 91/3 has a maximum SUV of 2.9. A subcarinal node measuring 1 point 7 cm on image 97/3 has maximum SUV of 3.9. There is high activity along the nonunited upper portion of the sternotomy site of uncertain significance, maximum SUV 5.8. There is combination of sternotomy wires and plate and screw fixators along the sternum. I do not see bony destructive findings characteristic of overt dehiscence or lucency around the fixators. 5 mm subpleural nodule along the right upper lobe on image 82/3  is unchanged 09/20/2017 and not perceptibly hypermetabolic. Incidental CT findings: Coronary, aortic arch, and branch vessel atherosclerotic vascular disease. Dual lead pacer. Prosthetic aortic and mitral valves. Mild cardiomegaly. ABDOMEN/PELVIS: Small foci of metabolic accentuation in the transverse colon without CT correlate, most likely to be physiologic. Incidental CT findings: Hepatic cirrhosis. Complex rim calcified 1.9 cm exophytic lesion of the left mid kidney laterally is not appreciably hypermetabolic. Aortoiliac atherosclerotic vascular disease. There are few descending and sigmoid colon diverticula. Mild prostatomegaly. Calcifications along the tunic albuginea in the penis, possibly from Peyronie disease. SKELETON: No significant abnormal hypermetabolic activity in this region. Incidental CT findings: Accentuated activity along the sternal nonunion site is noted above. IMPRESSION: 1. Adenopathy in the chest and lower neck, mediastinum, and right hilum is chronic, similar from 09/20/2017, and has a metabolic activity in between the blood pool and the background hepatic activity which would correspond to [place] 3 activity. This chronic adenopathy could be from a chronic low-grade lymphoproliferative disorder but could also be from chronic inflammation. Tissue diagnosis could be helpful in differentiation if clinically warranted. 2. Small foci of activity in the transverse colon without CT correlate. This is likely physiologic. Correlation with the patient's colon cancer screening history is recommended. If screening is not up-to-date, appropriate screening should be considered. 3. Other imaging findings of potential clinical significance: Chronic paranasal sinusitis potentially with acute right frontal sinusitis. Accentuated activity along the sternotomy site likely from nonunion. Aortic Atherosclerosis (ICD10-I70.0). Coronary atherosclerosis with mild cardiomegaly. Hepatic cirrhosis. Rim calcified  complex exophytic 1.9 cm lesion of the left mid kidney is unchanged and not hypermetabolic, likely a complex cyst. Prostatomegaly. Possible Peyronie disease (calcifications along the tunic albuginea of the penis). Electronically Signed   By: Van Clines M.D.   On: 12/09/2018 16:31   Dg Chest Port 1 View  Result Date: 12/01/2018 CLINICAL DATA:  Shortness of breath with lightheadedness EXAM: PORTABLE CHEST 1 VIEW COMPARISON:  10/29/2017 FINDINGS: Interval pacer from the left with leads at the right ventricle. Interval mitral valve replacement. Low volume chest. There is no edema, consolidation, effusion, or pneumothorax. Normal heart size when accounting for low volumes. IMPRESSION: Low volume chest without acute finding. Electronically Signed   By: Monte Fantasia M.D.   On: 12/01/2018 07:13    ASSESSMENT & PLAN:   Mediastinal lymphadenopathy # Mediastinal LN/right supraclavicular adenopathy 2 to 4 cm in size; mildly FDG avid; slightly increased in size/stable compared to CT scan from December 2018.  #The differential diagnosis includes low-grade lymphoproliferative neoplasm/versus benign reactive causes.  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  Had a long discussion regarding option biopsy versus watch/and wait.  As the patient is asymptomatic/overall low uptake on PET scan/overall slight growth compared to CT imaging in December 2018-I think it is reasonable to monitor with repeat imaging in approximately 6 months.  Also discussed that low-grade lymphoproliferative neoplasm did not be treated  upfront as long patient is asymptomatic.  We will also review the tumor conference.  #Mild transverse colonic uptake-colonoscopy more than 10 years ago.  Given the vestibular neuritis/Coumadin-would recommend holding off immediate colonoscopy.  This could be addressed when acute issues resolve.  #Cirrhosis-currently compensated.  Unclear etiology. Discussed multiple etiologies of cirrhosis include fatty  liver/infectious causes etc.  Recommend evaluation with GI.  Defer to PCP for referral.  # Dizyness/vestibular neuritis-followed by Dr. Pryor Ochoa ENT  # DISPOSITION: # in  6 months- MD/labs/PET prior- Dr.B  # I reviewed the blood work- with the patient in detail; also reviewed the imaging independently [as summarized above]; and with the patient in detail.    All questions were answered. The patient knows to call the clinic with any problems, questions or concerns.    Cammie Sickle, MD 12/10/2018 7:04 PM

## 2018-12-12 DIAGNOSIS — R42 Dizziness and giddiness: Secondary | ICD-10-CM | POA: Diagnosis not present

## 2018-12-15 DIAGNOSIS — H9121 Sudden idiopathic hearing loss, right ear: Secondary | ICD-10-CM | POA: Diagnosis not present

## 2018-12-16 ENCOUNTER — Ambulatory Visit: Payer: PPO

## 2018-12-16 ENCOUNTER — Telehealth: Payer: Self-pay | Admitting: Family Medicine

## 2018-12-16 NOTE — Telephone Encounter (Signed)
[name] [name] w/ [facility] [phone] ext [phone]  Calling for pt's CPap machine approval. Needing form signed, approved and faxed back.  Was faxed on 12-09-18 and faxed again today.  Thanks, [employer]

## 2018-12-16 NOTE — Telephone Encounter (Signed)
Left in your stack to sign. Thanks!

## 2018-12-17 ENCOUNTER — Other Ambulatory Visit: Payer: Self-pay

## 2018-12-17 ENCOUNTER — Telehealth: Payer: Self-pay

## 2018-12-17 DIAGNOSIS — G4733 Obstructive sleep apnea (adult) (pediatric): Secondary | ICD-10-CM | POA: Diagnosis not present

## 2018-12-17 MED ORDER — ALPRAZOLAM 0.25 MG PO TABS: 0.2500 mg | ORAL_TABLET | Freq: Four times a day (QID) | ORAL | 0 refills | Status: DC | PRN

## 2018-12-17 NOTE — Telephone Encounter (Signed)
[name] [name] would like to know if he could have something very mild for anxiety

## 2018-12-18 ENCOUNTER — Inpatient Hospital Stay: Payer: PPO | Attending: Internal Medicine

## 2018-12-18 ENCOUNTER — Telehealth: Payer: Self-pay

## 2018-12-18 NOTE — Telephone Encounter (Signed)
Opened in error

## 2018-12-18 NOTE — Progress Notes (Signed)
Tumor Board Documentation  [name] [name] was presented by Dr Rogue Bussing at our Tumor Board on 12/18/2018, which included representatives from medical oncology, radiation oncology, surgical, radiology, pathology, internal medicine, navigation, research, pulmonology.  [name] [name] currently presents as a current patient with history of the following treatments: active survellience.  Additionally, we reviewed previous medical and familial history, history of present illness, and recent lab results along with all available histopathologic and imaging studies. The tumor board considered available treatment options and made the following recommendations: Active surveillance PET in 6 months  The following procedures/referrals were also placed: No orders of the defined types were placed in this encounter.   Clinical Trial Status: not discussed   Staging used:    National site-specific guidelines   were discussed with respect to the case.  Tumor board is a meeting of clinicians from various specialty areas who evaluate and discuss patients for whom a multidisciplinary approach is being considered. Final determinations in the plan of care are those of the provider(s). The responsibility for follow up of recommendations given during tumor board is that of the provider.   Today's extended care, comprehensive team conference, [name] [name] was not present for the discussion and was not examined.   Multidisciplinary Tumor Board is a multidisciplinary case peer review process.  Decisions discussed in the Multidisciplinary Tumor Board reflect the opinions of the specialists present at the conference without having examined the patient.  Ultimately, treatment and diagnostic decisions rest with the primary provider(s) and the patient.

## 2018-12-19 ENCOUNTER — Telehealth: Payer: Self-pay

## 2018-12-19 MED ORDER — OSELTAMIVIR PHOSPHATE 75 MG PO CAPS: 75.0000 mg | ORAL_CAPSULE | Freq: Every day | ORAL | 0 refills | Status: DC

## 2018-12-19 NOTE — Telephone Encounter (Signed)
OK to Rx Tamiflu 75 mg daily x10 days #10, r0 for flu prophylaxis

## 2018-12-19 NOTE — Telephone Encounter (Signed)
Patient and wife have both been in and out of hospital with illness and having surgeries.  Daughter has been living with them the last 1-2 weeks care for both.  She has tested positive for Flu and they want to know if they should get on Tamiflu due to their current situation.  If so can we send it in to pharmacy. Thanks

## 2018-12-19 NOTE — Telephone Encounter (Signed)
Patient advised. Prescription sent into the pharmacy.

## 2018-12-22 ENCOUNTER — Telehealth: Payer: Self-pay | Admitting: Family Medicine

## 2018-12-22 NOTE — Telephone Encounter (Signed)
[name] [name] with [facility] needs an order for a tub bench.  She said you can fax the order to [facility] for the patient  FAX # [phone]  Thanks teri

## 2018-12-22 NOTE — Telephone Encounter (Signed)
Written, waiting on Dr Darnell Level to sign then will fax

## 2018-12-23 DIAGNOSIS — H905 Unspecified sensorineural hearing loss: Secondary | ICD-10-CM | POA: Diagnosis not present

## 2018-12-23 DIAGNOSIS — H9121 Sudden idiopathic hearing loss, right ear: Secondary | ICD-10-CM | POA: Diagnosis not present

## 2018-12-23 NOTE — Telephone Encounter (Signed)
Please review. Thanks!  

## 2018-12-25 DIAGNOSIS — I4891 Unspecified atrial fibrillation: Secondary | ICD-10-CM | POA: Diagnosis not present

## 2018-12-25 DIAGNOSIS — I4892 Unspecified atrial flutter: Secondary | ICD-10-CM | POA: Diagnosis not present

## 2018-12-25 DIAGNOSIS — I2510 Atherosclerotic heart disease of native coronary artery without angina pectoris: Secondary | ICD-10-CM | POA: Diagnosis not present

## 2018-12-26 NOTE — Telephone Encounter (Signed)
Samantha with [facility] call back saying patient needs a Transfer Tub Bench not a regular tub bench.  The order has to say Transfer Tub Bench.  Please fax to [facility] at [phone]  Thanks teri

## 2018-12-26 NOTE — Telephone Encounter (Signed)
Okay to put in this order.  Thank you.

## 2018-12-29 DIAGNOSIS — I4892 Unspecified atrial flutter: Secondary | ICD-10-CM | POA: Diagnosis not present

## 2018-12-29 DIAGNOSIS — R42 Dizziness and giddiness: Secondary | ICD-10-CM | POA: Diagnosis not present

## 2018-12-29 DIAGNOSIS — I2510 Atherosclerotic heart disease of native coronary artery without angina pectoris: Secondary | ICD-10-CM | POA: Diagnosis not present

## 2018-12-29 DIAGNOSIS — I4891 Unspecified atrial fibrillation: Secondary | ICD-10-CM | POA: Diagnosis not present

## 2018-12-29 NOTE — Telephone Encounter (Signed)
Order written awaiting signature from MD and this will fax

## 2018-12-31 ENCOUNTER — Ambulatory Visit: Payer: PPO

## 2018-12-31 ENCOUNTER — Other Ambulatory Visit: Payer: Self-pay

## 2018-12-31 DIAGNOSIS — I4892 Unspecified atrial flutter: Secondary | ICD-10-CM | POA: Diagnosis not present

## 2018-12-31 DIAGNOSIS — I4891 Unspecified atrial fibrillation: Secondary | ICD-10-CM | POA: Diagnosis not present

## 2018-12-31 DIAGNOSIS — I482 Chronic atrial fibrillation, unspecified: Secondary | ICD-10-CM | POA: Diagnosis not present

## 2018-12-31 DIAGNOSIS — Z5181 Encounter for therapeutic drug level monitoring: Secondary | ICD-10-CM

## 2018-12-31 DIAGNOSIS — Z950 Presence of cardiac pacemaker: Secondary | ICD-10-CM | POA: Diagnosis not present

## 2018-12-31 DIAGNOSIS — Z952 Presence of prosthetic heart valve: Secondary | ICD-10-CM | POA: Diagnosis not present

## 2018-12-31 LAB — POCT INR: INR: 4.9 — AB (ref 2.0–3.0)

## 2018-12-31 MED ORDER — WARFARIN SODIUM 3 MG PO TABS: ORAL_TABLET | ORAL | 0 refills | Status: DC

## 2018-12-31 NOTE — Patient Instructions (Signed)
Please skip coumadin tonight, take 1 tablet tomorrow, then continue dosage of 1.5 tablets every day EXCEPT 2 TABLETS ON FRIDAYS. Recheck in 3 weeks. Please call [phone] if you have any further procedures.

## 2019-01-08 DIAGNOSIS — G4733 Obstructive sleep apnea (adult) (pediatric): Secondary | ICD-10-CM | POA: Diagnosis not present

## 2019-01-12 ENCOUNTER — Other Ambulatory Visit: Payer: Self-pay | Admitting: Internal Medicine

## 2019-01-19 ENCOUNTER — Other Ambulatory Visit: Payer: Self-pay

## 2019-01-19 ENCOUNTER — Ambulatory Visit (INDEPENDENT_AMBULATORY_CARE_PROVIDER_SITE_OTHER): Payer: PPO

## 2019-01-19 ENCOUNTER — Telehealth: Payer: Self-pay

## 2019-01-19 DIAGNOSIS — Z0000 Encounter for general adult medical examination without abnormal findings: Secondary | ICD-10-CM | POA: Diagnosis not present

## 2019-01-19 NOTE — Patient Instructions (Addendum)
[name] [name] , Thank you for taking time to come for your Medicare Wellness Visit. I appreciate your ongoing commitment to your health goals. Please review the following plan we discussed and let me know if I can assist you in the future.   Screening recommendations/referrals: Colonoscopy: No longer required.  Recommended yearly ophthalmology/optometry visit for glaucoma screening and checkup Recommended yearly dental visit for hygiene and checkup  Vaccinations: Influenza vaccine: Up to date Pneumococcal vaccine: Completed series Tdap vaccine: Up to date, due 05/2020 Shingles vaccine: the patient    Advanced directives: Currently on file.   Conditions/risks identified: Continue to increase water intake to 6-8 glasses a day and decrease the amount of soda intake a day.   Next appointment: 02/04/19 for a follow up appointment with Dr Rosanna Randy.   Preventive Care 21 Years and Older, Male Preventive care refers to lifestyle choices and visits with your health care provider that can promote health and wellness. What does preventive care include?  A yearly physical exam. This is also called an annual well check.  Dental exams once or twice a year.  Routine eye exams. Ask your health care provider how often you should have your eyes checked.  Personal lifestyle choices, including:  Daily care of your teeth and gums.  Regular physical activity.  Eating a healthy diet.  Avoiding tobacco and drug use.  Limiting alcohol use.  Practicing safe sex.  Taking low doses of aspirin every day.  Taking vitamin and mineral supplements as recommended by your health care provider. What happens during an annual well check? The services and screenings done by your health care provider during your annual well check will depend on your age, overall health, lifestyle risk factors, and family history of disease. Counseling  Your health care provider may ask you questions about your:  Alcohol  use.  Tobacco use.  Drug use.  Emotional well-being.  Home and relationship well-being.  Sexual activity.  Eating habits.  History of falls.  Memory and ability to understand (cognition).  Work and work Statistician. Screening  You may have the following tests or measurements:  Height, weight, and BMI.  Blood pressure.  Lipid and cholesterol levels. These may be checked every 5 years, or more frequently if you are over 60 years old.  Skin check.  Lung cancer screening. You may have this screening every year starting at age 23 if you have a 30-pack-year history of smoking and currently smoke or have quit within the past 15 years.  Fecal occult blood test (FOBT) of the stool. You may have this test every year starting at age 75.  Flexible sigmoidoscopy or colonoscopy. You may have a sigmoidoscopy every 5 years or a colonoscopy every 10 years starting at age 48.  Prostate cancer screening. Recommendations will vary depending on your family history and other risks.  Hepatitis C blood test.  Hepatitis B blood test.  Sexually transmitted disease (STD) testing.  Diabetes screening. This is done by checking your blood sugar (glucose) after you have not eaten for a while (fasting). You may have this done every 1-3 years.  Abdominal aortic aneurysm (AAA) screening. You may need this if you are a current or former smoker.  Osteoporosis. You may be screened starting at age 50 if you are at high risk. Talk with your health care provider about your test results, treatment options, and if necessary, the need for more tests. Vaccines  Your health care provider may recommend certain vaccines, such as:  Influenza  vaccine. This is recommended every year.  Tetanus, diphtheria, and acellular pertussis (Tdap, Td) vaccine. You may need a Td booster every 10 years.  Zoster vaccine. You may need this after age 64.  Pneumococcal 13-valent conjugate (PCV13) vaccine. One dose is  recommended after age 80.  Pneumococcal polysaccharide (PPSV23) vaccine. One dose is recommended after age 63. Talk to your health care provider about which screenings and vaccines you need and how often you need them. This information is not intended to replace advice given to you by your health care provider. Make sure you discuss any questions you have with your health care provider. Document Released: 10/28/2015 Document Revised: 06/20/2016 Document Reviewed: 08/02/2015 Elsevier Interactive Patient Education  2017 [employer] Prevention in the Home Falls can cause injuries. They can happen to people of all ages. There are many things you can do to make your home safe and to help prevent falls. What can I do on the outside of my home?  Regularly fix the edges of walkways and driveways and fix any cracks.  Remove anything that might make you trip as you walk through a door, such as a raised step or threshold.  Trim any bushes or trees on the path to your home.  Use bright outdoor lighting.  Clear any walking paths of anything that might make someone trip, such as rocks or tools.  Regularly check to see if handrails are loose or broken. Make sure that both sides of any steps have handrails.  Any raised decks and porches should have guardrails on the edges.  Have any leaves, snow, or ice cleared regularly.  Use sand or salt on walking paths during winter.  Clean up any spills in your garage right away. This includes oil or grease spills. What can I do in the bathroom?  Use night lights.  Install grab bars by the toilet and in the tub and shower. Do not use towel bars as grab bars.  Use non-skid mats or decals in the tub or shower.  If you need to sit down in the shower, use a plastic, non-slip stool.  Keep the floor dry. Clean up any water that spills on the floor as soon as it happens.  Remove soap buildup in the tub or shower regularly.  Attach bath mats  securely with double-sided non-slip rug tape.  Do not have throw rugs and other things on the floor that can make you trip. What can I do in the bedroom?  Use night lights.  Make sure that you have a light by your bed that is easy to reach.  Do not use any sheets or blankets that are too big for your bed. They should not hang down onto the floor.  Have a firm chair that has side arms. You can use this for support while you get dressed.  Do not have throw rugs and other things on the floor that can make you trip. What can I do in the kitchen?  Clean up any spills right away.  Avoid walking on wet floors.  Keep items that you use a lot in easy-to-reach places.  If you need to reach something above you, use a strong step stool that has a grab bar.  Keep electrical cords out of the way.  Do not use floor polish or wax that makes floors slippery. If you must use wax, use non-skid floor wax.  Do not have throw rugs and other things on the floor that can  make you trip. What can I do with my stairs?  Do not leave any items on the stairs.  Make sure that there are handrails on both sides of the stairs and use them. Fix handrails that are broken or loose. Make sure that handrails are as long as the stairways.  Check any carpeting to make sure that it is firmly attached to the stairs. Fix any carpet that is loose or worn.  Avoid having throw rugs at the top or bottom of the stairs. If you do have throw rugs, attach them to the floor with carpet tape.  Make sure that you have a light switch at the top of the stairs and the bottom of the stairs. If you do not have them, ask someone to add them for you. What else can I do to help prevent falls?  Wear shoes that:  Do not have high heels.  Have rubber bottoms.  Are comfortable and fit you well.  Are closed at the toe. Do not wear sandals.  If you use a stepladder:  Make sure that it is fully opened. Do not climb a closed  stepladder.  Make sure that both sides of the stepladder are locked into place.  Ask someone to hold it for you, if possible.  Clearly mark and make sure that you can see:  Any grab bars or handrails.  First and last steps.  Where the edge of each step is.  Use tools that help you move around (mobility aids) if they are needed. These include:  Canes.  Walkers.  Scooters.  Crutches.  Turn on the lights when you go into a dark area. Replace any light bulbs as soon as they burn out.  Set up your furniture so you have a clear path. Avoid moving your furniture around.  If any of your floors are uneven, fix them.  If there are any pets around you, be aware of where they are.  Review your medicines with your doctor. Some medicines can make you feel dizzy. This can increase your chance of falling. Ask your doctor what other things that you can do to help prevent falls. This information is not intended to replace advice given to you by your health care provider. Make sure you discuss any questions you have with your health care provider. Document Released: 07/28/2009 Document Revised: 03/08/2016 Document Reviewed: 11/05/2014 Elsevier Interactive Patient Education  2017 [employer].

## 2019-01-19 NOTE — Progress Notes (Addendum)
Subjective:   [name] [name] is a 85 y.o. male who presents for Medicare Annual/Subsequent preventive examination.  This visit is being conducted via virtual visit due to the COVID-19 pandemic. This patient has given me verbal consent via video chat to conduct this visit. Some vital signs may be absent or patient reported.    Review of Systems:  N/A  Cardiac Risk Factors include: advanced age (>24men, >57 women);diabetes mellitus;hypertension;male gender;dyslipidemia     Objective:    Vitals: There were no vitals taken for this visit.  There is no height or weight on file to calculate BMI. Unable to obtain vital sign today due to completing the AWV via video chat.   Advanced Directives 01/19/2019 12/01/2018 09/22/2018 07/08/2018 06/30/2018 06/30/2018 06/30/2018  Does Patient Have a Medical Advance Directive? Yes Yes Yes Yes Yes - Yes  Type of Advance Directive [place];Living will [place];Living will Healthcare Power of [place];Living will Living will Living will Living will  Does patient want to make changes to medical advance directive? - No - Patient declined No - Patient declined No - Patient declined No - Patient declined - -  Copy of [place] in Chart? Yes - validated most recent copy scanned in chart (See row information) Yes - validated most recent copy scanned in chart (See row information) No - copy requested Yes - - -  Would patient like information on creating a medical advance directive? - - - - - - -    Tobacco Social History   Tobacco Use  Smoking Status Former Smoker  . Types: Cigars, Cigarettes  . Last attempt to quit: 10/02/1974  . Years since quitting: 44.3  Smokeless Tobacco Never Used  Tobacco Comment   still smokes cigars once a day     Counseling given: Not Answered Comment: still smokes cigars once a day   Clinical Intake:  Pre-visit preparation completed: Yes   Pain Score: 0-No pain     Nutritional Status: BMI 25 -29 Overweight Nutritional Risks: None Diabetes: Yes  How often do you need to have someone help you when you read instructions, pamphlets, or other written materials from your doctor or pharmacy?: 1 - Never   Diabetes:  Is the patient diabetic?  Yes type 2 If diabetic, was a CBG obtained today?  No  Did the patient bring in their glucometer from home?  No  How often do you monitor your CBG's? Once a day.   Financial Strains and Diabetes Management:  Are you having any financial strains with the device, your supplies or your medication? No .  Does the patient want to be seen by Chronic Care Management for management of their diabetes?  No  Would the patient like to be referred to a Nutritionist or for Diabetic Management?  No   Diabetic Exams:  Diabetic Eye Exam: Completed 08/22/18.   Diabetic Foot Exam: Completed 11/12/18.   Interpreter Needed?: No  Information entered by :: [facility], LPN  Past Medical History:  Diagnosis Date  . Aortic stenosis    a. 01/2016 echo: mod AS; b. 04/2018 Cardiac MRI: sev AS; c. 05/2018 s/p mech AVR @ time of myomectomy; d. 07/2018 Echo: Mild AS/AI.  [name] Atherosclerosis of aorta ([place])    by CT scan in past  . Atrial fibrillation ([place])    and aflutter. pt has a left atrial circuit that is not ablated. was on amiodarone-stopped, now use rate control.   [name]  Bladder cancer ([place])    hx; treated with BCG in past   . Carotid artery disease ([place])    There was calcified plaque but no stenosis by carotid artery screening done [facility] October, 2013  . CHB (complete heart block) ([place])    a. 05/2018 s/p BSX VVIR PPM ([facility]) in setting of bradycardia following myomectomy and AVR/MVR.  . Diabetes mellitus    type not specified  . Diabetic neuropathy (HCC)    feet  . Elevated liver enzymes    over time; hx  . Excessive sweating   . Glaucoma   . Gout   . Head injury    when  slipped on ice 2004-2005. stabilized and back on Coumadin   . Headache    migraines - distant past  . History of cardiac cath    a. 05/2018 Cath ([facility]): Non-obstructive CAD. Mildly elevated filling pressures w/ nl CI.  [name] HOCM (hypertrophic obstructive cardiomyopathy) ([place])    a. 01/2016 Echo: EF 65-70%, no rwma, LVOT gradient of 80-58mmHg, mod AS, SAM; b. 11/2017 Echo: EF 55-60%, near cavity obliteration in systole, mod AS, mild MS; c. 04/2018 Card MRI ([facility]): Sev LVH, EF >70%, Sev AS, LVOT obs w/ Sev MR, mild to mod TR/PR, mid-myocardial HE in basal-mid anteroseptum and inferoseptum; d. 05/2018 s/p Septal myomectomy; e. 07/2018 Echo: EF 60-65%. PASP 42mmHg.  [name] Homocystinemia ([place])    elevated, mild   . Hypercholesterolemia    treated.   . Hypertension   . Infection of right inner ear   . Kidney mass    a. s/p laproscopic surgery woth cryoablation of a mass outside kidney followed at [facility]; b. 11/2018 CT Chest: 2cm R kidney mass.  . Mediastinal adenopathy    a. 12/208 CT Chest: up to 2mm LLL lung nodule. 1.5-1.7cm subcarinal/mediastinal adenopahty/right paratracheal lymph node; b. 11/2018 CT Chest: 2.5-3cm mediastinal adenopathy.  . Mitral regurgitation    a. 04/2018 Cardiac MRI: sev MR; b. 05/2018 s/p mech MVR @ time of myomectomy; c. 07/2018 Echo: Mild MS.  . Motion sickness    ocean boats  . Nausea   . Nonobstructive coronary artery disease    a. 05/2018 Cath ([facility]): nonobs CAD.  [name] Obstructive sleep apnea    CPAP started successfully 2014  . Orthostatic hypotension    a. orthostatic. dehydration. hospitalized 11/11  . Sleep apnea    Significant obstructive sleep apnea diagnosed in August, 2012, the patient is to receive CPAP   . Stroke [facility])    "2 old strokes" CT and MRI. [place] [facility] 11/11. diagnosis was dehydration, no acutal reports.   . TSH elevation    on synthroid historically   . Unsteady gait    August, 2012   Past Surgical History:  Procedure Laterality Date  . BLADDER  SURGERY    . CATARACT EXTRACTION W/PHACO Left 05/11/2015   Procedure: CATARACT EXTRACTION PHACO AND INTRAOCULAR LENS PLACEMENT (IOC);  Surgeon: Leandrew Koyanagi, MD;  Location: [place];  Service: Ophthalmology;  Laterality: Left;  DIABETIC - oral meds, CPAP  . ESOPHAGOGASTRODUODENOSCOPY (EGD) WITH PROPOFOL N/A 07/01/2018   Procedure: ESOPHAGOGASTRODUODENOSCOPY (EGD) WITH PROPOFOL;  Surgeon: [name], MD;  Location[facility] ENDOSCOPY;  Service: Endoscopy;  Laterality: N/A;  . INSERT / REPLACE / REMOVE PACEMAKER     Chemical engineer   . KIDNEY SURGERY     "froze mass"  . MECHANICAL AORTIC AND MITRAL VALVE REPLACEMENT  05/2018   [facility]   . TONSILLECTOMY    .  VALVE REPLACEMENT     Family History  Problem Relation Age of Onset  . Arrhythmia Father        A-Fib  . Prostate cancer Father   . Stroke Father   . Breast cancer Mother   . Arrhythmia Brother        A-Fib  . Heart attack Neg Hx   . Hypertension Neg Hx    Social History   Socioeconomic History  . Marital status: Married    Spouse name: Jasmine Awe  . Number of children: 1  . Years of education: 26  . Highest education level: Bachelor's degree (e.g., BA, AB, BS)  Occupational History  . Occupation: retired  Scientific laboratory technician  . Financial resource strain: Not hard at all  . Food insecurity:    Worry: Never true    Inability: Never true  . Transportation needs:    Medical: No    Non-medical: No  Tobacco Use  . Smoking status: Former Smoker    Types: Cigars, Cigarettes    Last attempt to quit: 10/02/1974    Years since quitting: 44.3  . Smokeless tobacco: Never Used  . Tobacco comment: still smokes cigars once a day  Substance and Sexual Activity  . Alcohol use: No    Frequency: Never  . Drug use: No  . Sexual activity: Yes  Lifestyle  . Physical activity:    Days per week: 0 days    Minutes per session: 0 min  . Stress: Only a little  Relationships  . Social connections:    Talks on phone: Patient  refused    Gets together: Patient refused    Attends religious service: Patient refused    Active member of club or organization: Patient refused    Attends meetings of clubs or organizations: Patient refused    Relationship status: Patient refused  Other Topics Concern  . Not on file  Social History Narrative   Married, retired, gets regular exercise.     Outpatient Encounter Medications as of 01/19/2019  Medication Sig  . aspirin EC 81 MG tablet Take 1 tablet (81 mg total) by mouth daily.  [name] glucose blood (ONE TOUCH ULTRA TEST) test strip USE ONE STRIP TO CHECK GLUCOSE ONCE DAILY  . latanoprost (XALATAN) 0.005 % ophthalmic solution Place 1 drop into both eyes at bedtime.   [name] levothyroxine (SYNTHROID, LEVOTHROID) 50 MCG tablet TAKE 1 TABLET BY MOUTH ONCE DAILY  . metFORMIN (GLUCOPHAGE) 1000 MG tablet Take 1 tablet (1,000 mg total) by mouth daily with breakfast.  . metoprolol tartrate (LOPRESSOR) 25 MG tablet Take 12.5 mg by mouth 2 (two) times daily.  Glory Rosebush DELICA LANCETS 02M MISC USE ONE LANCET TO CHECK BLOOD GLUCOSE ONCE DAILY  . pantoprazole (PROTONIX) 40 MG tablet Take 1 tablet (40 mg total) by mouth 2 (two) times daily. (Patient taking differently: Take 40 mg by mouth daily. )  . simvastatin (ZOCOR) 40 MG tablet TAKE 1/2 (ONE-HALF) TABLET BY MOUTH AT BEDTIME  . warfarin (COUMADIN) 3 MG tablet Take 1.5 to 2 tablets daily as directed by Coumadin clinic  . acetaminophen (TYLENOL) 325 MG tablet Take 650 mg by mouth every 6 (six) hours as needed.  . ALPRAZolam (XANAX) 0.25 MG tablet Take 1 tablet (0.25 mg total) by mouth every 6 (six) hours as needed for anxiety. (Patient not taking: Reported on 01/19/2019)  . ONE TOUCH ULTRA TEST test strip USE TO TEST BLOOD SUGAR ONCE DAILY (Patient not taking: Reported on 01/19/2019)  .  ONETOUCH DELICA LANCETS 78M MISC USE TO TEST BLOOD SUGAR ONCE DAILY (Patient not taking: Reported on 01/19/2019)  . oseltamivir (TAMIFLU) 75 MG capsule Take 1 capsule (75  mg total) by mouth daily. (Patient not taking: Reported on 01/19/2019)   No facility-administered encounter medications on file as of 01/19/2019.     Activities of Daily Living In your present state of health, do you have any difficulty performing the following activities: 01/19/2019 12/01/2018  Hearing? Y Y  Comment Due to a recent ear infection in the right ear. Wears hearing aids.  -  Vision? N N  Comment Wears eye glasses daily.  -  Difficulty concentrating or making decisions? N N  Walking or climbing stairs? N Y  Dressing or bathing? N Y  Doing errands, shopping? Y N  Comment Not driving currently due to balance issues from ear infection.  -  Preparing Food and eating ? N -  Using the Toilet? N -  In the past six months, have you accidently leaked urine? N -  Do you have problems with loss of bowel control? N -  Managing your Medications? N -  Managing your Finances? N -  Housekeeping or managing your Housekeeping? N -  Some recent data might be hidden    Patient Care Team: [name], MD as PCP - General (Unknown Physician Specialty) Wellington Hampshire, MD as PCP - Cardiology (Cardiology) Margaretha Sheffield, MD (Otolaryngology) Leandrew Koyanagi, MD as Referring Physician (Ophthalmology) Sharlotte Alamo, DPM as Consulting Physician (Podiatry) Bernardo Heater, Ronda Fairly, MD as Consulting Physician (Urology) Jannet Mantis, MD as Consulting Physician (Dermatology) Telford Nab, RN as Registered Nurse Sharlotte Alamo, DPM (Podiatry) [name], MD as Consulting Physician (Gastroenterology) Farrel Demark Deanne Coffer (Cardiology)   Assessment:   This is a routine wellness examination for Kaydan.  Exercise Activities and Dietary recommendations Current Exercise Habits: Home exercise routine, Type of exercise: stretching;walking, Time (Minutes): 25, Frequency (Times/Week): 7, Weekly Exercise (Minutes/Week): 175, Intensity: Mild, Exercise limited by: cardiac condition(s)  Goals     . DIET - INCREASE WATER INTAKE     Recommend increasing water intake to 6-8 glasses a day and cutting back on diet sodas.     . Increase water intake     Starting 10/30/16, I will increase my water intake to 4 glasses a day.       Fall Risk Fall Risk  01/19/2019 09/22/2018 07/08/2018 11/11/2017 10/30/2016  Falls in the past year? 0 0 No Yes No  Number falls in past yr: - - - 1 -  Injury with Fall? - - - No -  Comment - - - bruising -  Risk Factor Category  - - - - -  Follow up - - - Falls prevention discussed -   FALL RISK PREVENTION PERTAINING TO THE HOME:  Any stairs in or around the home? Yes  If so, are there any without handrails? Yes   Home free of loose throw rugs in walkways, pet beds, electrical cords, etc? Yes  Adequate lighting in your home to reduce risk of falls? Yes   ASSISTIVE DEVICES UTILIZED TO PREVENT FALLS:  Life alert? No  Use of a cane, walker or w/c? Yes  Grab bars in the bathroom? No  Shower chair or bench in shower? Yes  Elevated toilet seat or a handicapped toilet? Yes    TIMED UP AND GO:  Was the test performed? No .    Depression Screen [facility] 2/9 Scores 01/19/2019 09/22/2018 07/08/2018  11/11/2017  PHQ - 2 Score 0 0 0 0  PHQ- 9 Score - 1 - 0    Cognitive Function MMSE - Mini Mental State Exam 11/11/2017  Orientation to time 4  Orientation to Place 5  Registration 3  Attention/ Calculation 5  Recall 0  Language- name 2 objects 2  Language- repeat 1  Language- follow 3 step command 3  Language- read & follow direction 1  Write a sentence 1  Copy design 1  Total score 26     6CIT Screen 01/19/2019 11/11/2017 10/30/2016  What Year? 0 points 0 points 0 points  What month? 0 points 0 points 0 points  What time? 0 points 0 points 0 points  Count back from 20 0 points 0 points 0 points  Months in reverse 0 points 2 points 0 points  Repeat phrase 0 points 2 points 0 points  Total Score 0 4 0    Immunization History  Administered Date(s)  Administered  . Influenza, High Dose Seasonal PF 07/17/2017, 07/05/2018  . Influenza-Unspecified 08/15/2013  . Pneumococcal Conjugate-13 04/08/2014  . Pneumococcal Polysaccharide-23 08/07/2003  . Td 11/23/2009  . Zoster 12/27/2008    Qualifies for Shingles Vaccine? Yes  Zostavax completed 12/27/08. Due for Shingrix. Education has been provided regarding the importance of this vaccine. Pt has been advised to call insurance company to determine out of pocket expense. Advised may also receive vaccine at local pharmacy or Health Dept. Verbalized acceptance and understanding.  Tdap: Up to date  Flu Vaccine: Up to date  Pneumococcal Vaccine: Up to date  Screening Tests Health Maintenance  Topic Date Due  . URINE MICROALBUMIN  10/30/2017  . HEMOGLOBIN A1C  01/08/2019  . INFLUENZA VACCINE  05/16/2019  . OPHTHALMOLOGY EXAM  08/23/2019  . FOOT EXAM  11/13/2019  . TETANUS/TDAP  11/24/2019  . PNA vac Low Risk Adult  Completed   Cancer Screenings:  Colorectal Screening: No longer required.   Lung Cancer Screening: (Low Dose CT Chest recommended if Age 45-80 years, 30 pack-year currently smoking OR have quit w/in 15years.) does not qualify.   Additional Screening:  Dental Screening: Recommended annual dental exams for proper oral hygiene  Community Resource Referral:  CRR required this visit?  No        Plan:  I have personally reviewed and addressed the Medicare Annual Wellness questionnaire and have noted the following in the patient's chart:  A. Medical and social history B. Use of alcohol, tobacco or illicit drugs  C. Current medications and supplements D. Functional ability and status E.  Nutritional status F.  Physical activity G. Advance directives H. List of other physicians I.  Hospitalizations, surgeries, and ER visits in previous 12 months J.  [place] such as hearing and vision if needed, cognitive and depression L. Referrals and appointments   In  addition, I have reviewed and discussed with patient certain preventive protocols, quality metrics, and best practice recommendations. A written personalized care plan for preventive services as well as general preventive health recommendations were provided to patient.   Glendora Score, [place]  04/18/1699 Nurse Health Advisor  Nurse Notes: Pt needs a urine check and Hgb A1c checked at next OV.

## 2019-01-19 NOTE — Telephone Encounter (Signed)

## 2019-01-21 ENCOUNTER — Other Ambulatory Visit: Payer: Self-pay

## 2019-01-21 ENCOUNTER — Ambulatory Visit (INDEPENDENT_AMBULATORY_CARE_PROVIDER_SITE_OTHER): Payer: PPO

## 2019-01-21 DIAGNOSIS — I4891 Unspecified atrial fibrillation: Secondary | ICD-10-CM

## 2019-01-21 DIAGNOSIS — Z950 Presence of cardiac pacemaker: Secondary | ICD-10-CM

## 2019-01-21 DIAGNOSIS — Z5181 Encounter for therapeutic drug level monitoring: Secondary | ICD-10-CM | POA: Diagnosis not present

## 2019-01-21 DIAGNOSIS — Z952 Presence of prosthetic heart valve: Secondary | ICD-10-CM | POA: Diagnosis not present

## 2019-01-21 DIAGNOSIS — I482 Chronic atrial fibrillation, unspecified: Secondary | ICD-10-CM

## 2019-01-21 DIAGNOSIS — Z23 Encounter for immunization: Secondary | ICD-10-CM | POA: Diagnosis not present

## 2019-01-21 DIAGNOSIS — I4892 Unspecified atrial flutter: Secondary | ICD-10-CM | POA: Diagnosis not present

## 2019-01-21 LAB — POCT INR: INR: 3.4 — AB (ref 2.0–3.0)

## 2019-01-21 NOTE — Patient Instructions (Signed)
Please continue dosage of 1.5 tablets every day EXCEPT 2 TABLETS ON FRIDAYS. Recheck in 5 weeks.

## 2019-02-03 ENCOUNTER — Other Ambulatory Visit: Payer: Self-pay

## 2019-02-03 ENCOUNTER — Ambulatory Visit (INDEPENDENT_AMBULATORY_CARE_PROVIDER_SITE_OTHER): Payer: PPO | Admitting: *Deleted

## 2019-02-03 DIAGNOSIS — I4892 Unspecified atrial flutter: Secondary | ICD-10-CM

## 2019-02-03 DIAGNOSIS — I4891 Unspecified atrial fibrillation: Secondary | ICD-10-CM

## 2019-02-03 DIAGNOSIS — I442 Atrioventricular block, complete: Secondary | ICD-10-CM

## 2019-02-04 ENCOUNTER — Ambulatory Visit: Payer: Self-pay | Admitting: Family Medicine

## 2019-02-06 LAB — CUP PACEART REMOTE DEVICE CHECK
Battery Remaining Longevity: 102 mo
Battery Remaining Percentage: 100 %
Brady Statistic RV Percent Paced: 90 %
Date Time Interrogation Session: 20200415140900
Implantable Lead Implant Date: 20190828
Implantable Lead Location: 753860
Implantable Lead Model: 7742
Implantable Lead Serial Number: 804471
Implantable Pulse Generator Implant Date: 20190828
Lead Channel Impedance Value: 884 Ohm
Lead Channel Pacing Threshold Amplitude: 1 V
Lead Channel Pacing Threshold Pulse Width: 0.4 ms
Lead Channel Setting Pacing Amplitude: 1.5 V
Lead Channel Setting Pacing Pulse Width: 0.4 ms
Lead Channel Setting Sensing Sensitivity: 0.6 mV
Pulse Gen Serial Number: 787662

## 2019-02-08 DIAGNOSIS — G4733 Obstructive sleep apnea (adult) (pediatric): Secondary | ICD-10-CM | POA: Diagnosis not present

## 2019-02-09 ENCOUNTER — Other Ambulatory Visit: Payer: Self-pay

## 2019-02-09 NOTE — Progress Notes (Signed)
Remote pacemaker transmission.   

## 2019-02-10 ENCOUNTER — Other Ambulatory Visit: Payer: Self-pay

## 2019-02-10 ENCOUNTER — Other Ambulatory Visit
Admission: RE | Admit: 2019-02-10 | Discharge: 2019-02-10 | Disposition: A | Discharge status: Home / Self Care | Payer: PPO | Source: Ambulatory Visit | Attending: Gastroenterology | Admitting: Gastroenterology

## 2019-02-10 ENCOUNTER — Encounter: Payer: Self-pay | Admitting: Gastroenterology

## 2019-02-10 ENCOUNTER — Ambulatory Visit (INDEPENDENT_AMBULATORY_CARE_PROVIDER_SITE_OTHER): Payer: PPO | Admitting: Gastroenterology

## 2019-02-10 DIAGNOSIS — R9389 Abnormal findings on diagnostic imaging of other specified body structures: Secondary | ICD-10-CM

## 2019-02-10 DIAGNOSIS — K7460 Unspecified cirrhosis of liver: Secondary | ICD-10-CM | POA: Diagnosis not present

## 2019-02-10 NOTE — Progress Notes (Signed)
[name] Lame, MD [address]  [place]  [place], [place] [address]  Main: [phone]  Fax: [phone]    Gastroenterology Virtual/Video Visit  Referring Provider:     [name],* Primary Care Physician:  [name] Banana., MD Primary Gastroenterologist:  Dr.Evana Runnels Allen [name] Reason for Consultation:     Cirrhosis and positive PET scan        HPI:    Virtual Visit via Video Note Location of the patient: Home Location of provider: Office  Participating persons: The patient myself and Ginger Feldpausch.  I connected with [name] [name] on 02/10/19 at 10:00 AM EDT by a video enabled telemedicine application and verified that I am speaking with the correct person using two identifiers.   I discussed the limitations of evaluation and management by telemedicine and the availability of in person appointments. The patient expressed understanding and agreed to proceed.  Verbal consent to proceed obtained.  History of Present Illness: [name] [name] is a 85 y.o. male who was having chest discomfort and was seen at [facility] and was also reporting some ear pain.  After an extensive work-up the patient was found to have mediastinal lymphadenopathy.  The patient then came home to [place] and was evaluated by oncology and found on a PET scan to have a hotspot in the mid transverse colon.  The patient has not had a colonoscopy since 2003.  The patient denies any abdominal pain nausea vomiting fevers or chills.  The patient was thought to possibly have lymphoma but the concern was the transverse colon lesion.  The patient was also found to have a nodular liver consistent with cirrhosis.  The patient's liver enzymes have been normal.  The patient denies any history of alcohol abuse or having liver problems in the past.  He is now recommended to see me due to the need for a colonoscopy and due to the cirrhosis seen on his imaging.  Past Medical History:   Diagnosis Date  . Aortic stenosis    a. 01/2016 echo: mod AS; b. 04/2018 Cardiac MRI: sev AS; c. 05/2018 s/p mech AVR @ time of myomectomy; d. 07/2018 Echo: Mild AS/AI.  [name] Atherosclerosis of aorta ([place])    by CT scan in past  . Atrial fibrillation ([place])    and aflutter. pt has a left atrial circuit that is not ablated. was on amiodarone-stopped, now use rate control.   . Bladder cancer ([place])    hx; treated with BCG in past   . Carotid artery disease ([place])    There was calcified plaque but no stenosis by carotid artery screening done at [facility] October, 2013  . CHB (complete heart block) ([place])    a. 05/2018 s/p BSX VVIR PPM ([facility]) in setting of bradycardia following myomectomy and AVR/MVR.  . Diabetes mellitus    type not specified  . Diabetic neuropathy ([facility])    feet  . Elevated liver enzymes    over time; hx  . Excessive sweating   . Glaucoma   . Gout   . Head injury    when slipped on ice 2004-2005. stabilized and back on Coumadin   . Headache    migraines - distant past  . History of cardiac cath    a. 05/2018 Cath ([facility]): Non-obstructive CAD. Mildly elevated filling pressures w/ nl CI.  [name] HOCM (hypertrophic obstructive cardiomyopathy) ([place])    a. 01/2016 Echo: EF 65-70%, no rwma, LVOT gradient of  80-24mmHg, mod AS, SAM; b. 11/2017 Echo: EF 55-60%, near cavity obliteration in systole, mod AS, mild MS; c. 04/2018 Card MRI ([facility]): Sev LVH, EF >70%, Sev AS, LVOT obs w/ Sev MR, mild to mod TR/PR, mid-myocardial HE in basal-mid anteroseptum and inferoseptum; d. 05/2018 s/p Septal myomectomy; e. 07/2018 Echo: EF 60-65%. PASP 42mmHg.  [name] Homocystinemia ([place])    elevated, mild   . Hypercholesterolemia    treated.   . Hypertension   . Infection of right inner ear   . Kidney mass    a. s/p laproscopic surgery woth cryoablation of a mass outside kidney followed at [facility]; b. 11/2018 CT Chest: 2cm R kidney mass.  . Mediastinal adenopathy    a. 12/208 CT Chest: up to 26mm LLL lung  nodule. 1.5-1.7cm subcarinal/mediastinal adenopahty/right paratracheal lymph node; b. 11/2018 CT Chest: 2.5-3cm mediastinal adenopathy.  . Mitral regurgitation    a. 04/2018 Cardiac MRI: sev MR; b. 05/2018 s/p mech MVR @ time of myomectomy; c. 07/2018 Echo: Mild MS.  . Motion sickness    ocean boats  . Nausea   . Nonobstructive coronary artery disease    a. 05/2018 Cath ([facility]): nonobs CAD.  [name] Obstructive sleep apnea    CPAP started successfully 2014  . Orthostatic hypotension    a. orthostatic. dehydration. hospitalized 11/11  . Sleep apnea    Significant obstructive sleep apnea diagnosed in August, 2012, the patient is to receive CPAP   . Stroke [facility])    "2 old strokes" CT and MRI. [facility] 11/11. diagnosis was dehydration, no acutal reports.   . TSH elevation    on synthroid historically   . Unsteady gait    August, 2012    Past Surgical History:  Procedure Laterality Date  . BLADDER SURGERY    . CATARACT EXTRACTION W/PHACO Left 05/11/2015   Procedure: CATARACT EXTRACTION PHACO AND INTRAOCULAR LENS PLACEMENT (IOC);  Surgeon: Leandrew Koyanagi, MD;  Location: [facility];  Service: Ophthalmology;  Laterality: Left;  DIABETIC - oral meds, CPAP  . ESOPHAGOGASTRODUODENOSCOPY (EGD) WITH PROPOFOL N/A 07/01/2018   Procedure: ESOPHAGOGASTRODUODENOSCOPY (EGD) WITH PROPOFOL;  Surgeon: [name] Lame, MD;  Location: [facility] ENDOSCOPY;  Service: Endoscopy;  Laterality: N/A;  . INSERT / REPLACE / REMOVE PACEMAKER     Chemical engineer   . KIDNEY SURGERY     "froze mass"  . MECHANICAL AORTIC AND MITRAL VALVE REPLACEMENT  05/2018   [facility]   . TONSILLECTOMY    . VALVE REPLACEMENT      Prior to Admission medications   Medication Sig Start Date End Date Taking? Authorizing Provider  acetaminophen (TYLENOL) 325 MG tablet Take 650 mg by mouth every 6 (six) hours as needed. 06/15/18   [provider]  ALPRAZolam Duanne Moron) 0.25 MG tablet Take 1 tablet (0.25 mg total) by mouth every 6  (six) hours as needed for anxiety. Patient not taking: Reported on 01/19/2019 12/17/18   [name] Banana., MD  aspirin EC 81 MG tablet Take 1 tablet (81 mg total) by mouth daily. 09/04/18   Deboraha Sprang, MD  glucose blood (ONE TOUCH ULTRA TEST) test strip USE ONE STRIP TO CHECK GLUCOSE ONCE DAILY 03/25/17   [name] Banana., MD  latanoprost (XALATAN) 0.005 % ophthalmic solution Place 1 drop into both eyes at bedtime.  11/13/13   [provider]  levothyroxine (SYNTHROID, LEVOTHROID) 50 MCG tablet TAKE 1 TABLET BY MOUTH ONCE DAILY 08/10/18   [name] Banana., MD  metFORMIN (GLUCOPHAGE) 1000  MG tablet Take 1 tablet (1,000 mg total) by mouth daily with breakfast. 08/19/18 08/19/19  [name] Banana., MD  metoprolol tartrate (LOPRESSOR) 25 MG tablet Take 12.5 mg by mouth 2 (two) times daily.    [provider]  ONE TOUCH ULTRA TEST test strip USE TO TEST BLOOD SUGAR ONCE DAILY Patient not taking: Reported on 01/19/2019 05/02/18   [name] Banana., MD  [facility] DELICA LANCETS 14E MISC USE ONE LANCET TO CHECK BLOOD GLUCOSE ONCE DAILY 03/25/17   [name] Banana., MD  [facility] DELICA LANCETS 31V MISC USE TO TEST BLOOD SUGAR ONCE DAILY Patient not taking: Reported on 01/19/2019 05/02/18   [name] Banana., MD  oseltamivir (TAMIFLU) 75 MG capsule Take 1 capsule (75 mg total) by mouth daily. Patient not taking: Reported on 01/19/2019 12/19/18   Virginia Crews, MD  pantoprazole (PROTONIX) 40 MG tablet Take 1 tablet (40 mg total) by mouth 2 (two) times daily. Patient taking differently: Take 40 mg by mouth daily.  09/15/18   [name] Banana., MD  simvastatin (ZOCOR) 40 MG tablet TAKE 1/2 (ONE-HALF) TABLET BY MOUTH AT BEDTIME 05/05/18   Wellington Hampshire, MD  warfarin (COUMADIN) 3 MG tablet Take 1.5 to 2 tablets daily as directed by Coumadin clinic 12/31/18   Wellington Hampshire, MD    Family History  Problem Relation Age of Onset  . Arrhythmia Father         A-Fib  . Prostate cancer Father   . Stroke Father   . Breast cancer Mother   . Arrhythmia Brother        A-Fib  . Heart attack Neg Hx   . Hypertension Neg Hx      Social History   Tobacco Use  . Smoking status: Former Smoker    Types: Cigars, Cigarettes    Last attempt to quit: 10/02/1974    Years since quitting: 44.3  . Smokeless tobacco: Never Used  . Tobacco comment: still smokes cigars once a day  Substance Use Topics  . Alcohol use: No    Frequency: Never  . Drug use: No    Allergies as of 02/10/2019 - Review Complete 01/19/2019  Allergen Reaction Noted  . Macrolides and ketolides Other (See Comments) 06/13/2015  . Mycinettes  06/15/2011  . Nitrofuran derivatives Other (See Comments) 05/21/2014  . Nitrofurantoin Other (See Comments) 06/13/2015  . Erythromycin Itching and Rash 05/21/2014  . Zofran [ondansetron hcl-dextrose] Rash 12/05/2018  . Zofran [ondansetron hcl] Rash 12/01/2018    Review of Systems:    All systems reviewed and negative except where noted in HPI.   Observations/Objective:  Labs: CBC    Component Value Date/Time   WBC 8.8 12/02/2018 0304   RBC 4.15 (L) 12/02/2018 0304   HGB 12.5 (L) 12/02/2018 0304   HGB 12.8 (L) 09/15/2018 1141   HCT 38.2 (L) 12/02/2018 0304   HCT 38.4 09/15/2018 1141   PLT 228 12/02/2018 0304   PLT 249 09/15/2018 1141   MCV 92.0 12/02/2018 0304   MCV 89 09/15/2018 1141   MCH 30.1 12/02/2018 0304   MCHC 32.7 12/02/2018 0304   RDW 14.0 12/02/2018 0304   RDW 13.4 09/15/2018 1141   LYMPHSABS 1.7 12/01/2018 0045   LYMPHSABS 2.4 09/15/2018 1141   MONOABS 0.9 12/01/2018 0045   EOSABS 0.4 12/01/2018 0045   EOSABS 0.5 (H) 09/15/2018 1141   BASOSABS 0.1 12/01/2018 0045   BASOSABS 0.1 09/15/2018 1141   CMP  Component Value Date/Time   NA 137 12/01/2018 0045   NA 137 07/29/2018 1526   K 3.7 12/01/2018 0045   CL 102 12/01/2018 0045   CO2 26 12/01/2018 0045   GLUCOSE 122 (H) 12/01/2018 0045   BUN 20  12/01/2018 0045   BUN 13 07/29/2018 1526   CREATININE 1.06 12/01/2018 0045   CALCIUM 9.0 12/01/2018 0045   PROT 8.0 12/01/2018 0045   PROT 7.2 07/10/2018 1229   ALBUMIN 3.7 12/01/2018 0045   ALBUMIN 3.6 07/10/2018 1229   AST 25 12/01/2018 0045   ALT 13 12/01/2018 0045   ALKPHOS 58 12/01/2018 0045   BILITOT 1.0 12/01/2018 0045   BILITOT 0.3 07/10/2018 1229   GFRNONAA >60 12/01/2018 0045   GFRAA >60 12/01/2018 0045    Imaging Studies: No results found.  Assessment and Plan:   [name] [name] is a 85 y.o. y/o male who was found to have a positive CT PET scan for cirrhosis and mediastinal lymphadenopathy.  The patient was sent to me for evaluation of this.  The patient will be set up for a blood test for alpha-fetoprotein.  The patient will also be set up for a colonoscopy to look for the source of the positive PET scan.  The patient last colonoscopy was in 2003.  The patient and his wife have been explained the plan and agree with it.  Follow Up Instructions:  I discussed the assessment and treatment plan with the patient. The patient was provided an opportunity to ask questions and all were answered. The patient agreed with the plan and demonstrated an understanding of the instructions.   The patient was advised to call back or seek an in-person evaluation if the symptoms worsen or if the condition fails to improve as anticipated.  I provided 20 minutes of non-face-to-face time during this encounter.   [name] Lame, MD  Speech recognition software was used to dictate the above note.

## 2019-02-11 LAB — AFP TUMOR MARKER: AFP, Serum, Tumor Marker: 2.2 ng/mL (ref 0.0–8.3)

## 2019-02-23 ENCOUNTER — Telehealth: Payer: Self-pay

## 2019-02-23 ENCOUNTER — Other Ambulatory Visit: Payer: Self-pay | Admitting: Cardiovascular Disease

## 2019-02-23 NOTE — Telephone Encounter (Signed)
1. Do you currently have a fever? No 2. Have you recently travelled on a cruise, internationally, or to [place], [place], [place], [place], [place], or [employer] [employer]) ? No 3. Have you been in contact with someone that is currently pending confirmation of Covid19 testing or has been confirmed to have the [place] virus?  No 4. Are you currently experiencing fatigue or cough? No  Spoke w/ Jasmine Awe.. Advised that we are restricting visitors at this time and anyone present in the vehicle should meet the above criteria as well. Advised that visit will be at curbside for finger stick ONLY and will receive call with instructions. Pt also advised to please bring own pen for signature of arrival document.

## 2019-02-23 NOTE — Telephone Encounter (Signed)
Refill request

## 2019-02-25 ENCOUNTER — Other Ambulatory Visit: Payer: Self-pay

## 2019-02-25 ENCOUNTER — Ambulatory Visit (INDEPENDENT_AMBULATORY_CARE_PROVIDER_SITE_OTHER): Payer: PPO

## 2019-02-25 DIAGNOSIS — Z950 Presence of cardiac pacemaker: Secondary | ICD-10-CM

## 2019-02-25 DIAGNOSIS — I4892 Unspecified atrial flutter: Secondary | ICD-10-CM

## 2019-02-25 DIAGNOSIS — Z952 Presence of prosthetic heart valve: Secondary | ICD-10-CM

## 2019-02-25 DIAGNOSIS — I4891 Unspecified atrial fibrillation: Secondary | ICD-10-CM

## 2019-02-25 DIAGNOSIS — Z5181 Encounter for therapeutic drug level monitoring: Secondary | ICD-10-CM

## 2019-02-25 DIAGNOSIS — I482 Chronic atrial fibrillation, unspecified: Secondary | ICD-10-CM

## 2019-02-25 LAB — POCT INR: INR: 4 — AB (ref 2.0–3.0)

## 2019-02-25 NOTE — Patient Instructions (Signed)
Please skip coumadin tonight, then continue dosage of 1.5 tablets every day EXCEPT 2 TABLETS ON FRIDAYS. Recheck in 4 weeks.

## 2019-03-02 ENCOUNTER — Ambulatory Visit: Payer: Self-pay | Admitting: Family Medicine

## 2019-03-03 ENCOUNTER — Telehealth: Payer: Self-pay | Admitting: Gastroenterology

## 2019-03-03 ENCOUNTER — Other Ambulatory Visit: Payer: Self-pay | Admitting: Family Medicine

## 2019-03-03 ENCOUNTER — Ambulatory Visit: Payer: PPO | Admitting: Gastroenterology

## 2019-03-03 DIAGNOSIS — K279 Peptic ulcer, site unspecified, unspecified as acute or chronic, without hemorrhage or perforation: Secondary | ICD-10-CM

## 2019-03-03 NOTE — Telephone Encounter (Signed)
Pt wife  is calling to check if pt  had an Apt for today it was cancelled she states he did have a Virtual visit 02/10/19 and had Lab work done pt did not receive his results yet

## 2019-03-03 NOTE — Telephone Encounter (Signed)
[facility] faxed refill request for the following medications:  pantoprazole (PROTONIX) 40 MG tablet   Please advise.  

## 2019-03-04 ENCOUNTER — Telehealth: Payer: Self-pay

## 2019-03-04 MED ORDER — PANTOPRAZOLE SODIUM 40 MG PO TBEC: 40.0000 mg | DELAYED_RELEASE_TABLET | Freq: Two times a day (BID) | ORAL | 3 refills | Status: DC

## 2019-03-04 NOTE — Telephone Encounter (Signed)

## 2019-03-05 NOTE — Telephone Encounter (Signed)
Pt's wife informed of AFP lab result.  Discussed scheduling the colonoscopy and she feels he is a high risk to be going to the hospital since he has had open heart surgery. She would like to put this off for a couple of weeks in hopes the COVID -19 pandemic settles some and more restrictions are lifted.  They will decide at that time when to go. They are anxious to get the procedure done.

## 2019-03-06 ENCOUNTER — Encounter: Payer: Self-pay | Admitting: Cardiovascular Disease

## 2019-03-06 ENCOUNTER — Other Ambulatory Visit: Payer: Self-pay

## 2019-03-06 ENCOUNTER — Telehealth (INDEPENDENT_AMBULATORY_CARE_PROVIDER_SITE_OTHER): Payer: PPO | Admitting: Cardiovascular Disease

## 2019-03-06 VITALS — BP 143/83 | HR 70 | Ht 69.0 in | Wt 190.0 lb

## 2019-03-06 DIAGNOSIS — Z952 Presence of prosthetic heart valve: Secondary | ICD-10-CM

## 2019-03-06 DIAGNOSIS — I421 Obstructive hypertrophic cardiomyopathy: Secondary | ICD-10-CM

## 2019-03-06 NOTE — Progress Notes (Signed)
Virtual Visit via Video Note   This visit type was conducted due to national recommendations for restrictions regarding the COVID-19 Pandemic (e.g. social distancing) in an effort to limit this patient's exposure and mitigate transmission in our community.  Due to his co-morbid illnesses, this patient is at least at moderate risk for complications without adequate follow up.  This format is felt to be most appropriate for this patient at this time.  All issues noted in this document were discussed and addressed.  A limited physical exam was performed with this format.  Please refer to the patient's chart for his consent to telehealth for [facility].   Date:  03/06/2019   ID:  [name] [name], DOB 20-Mar-1934, MRN [id number]  Patient Location: Home Provider Location: Office  PCP:  [name], MD  Cardiologist:  Kathlyn Sacramento, MD  Electrophysiologist:  None   Evaluation Performed:  Follow-Up Visit  Chief Complaint: Hearing loss and balance problem  History of Present Illness:    [name] [name] is a 85 y.o. male was seen via video visit for follow-up. He has extensive past medical history including chronic atrial fibrillation, aortic stenosis, HOCM, hyperlipidemia, hypertension, type 2 diabetes mellitus, sleep apnea, mitral regurgitation, and hypothyroidism.  He was evaluated at [facility] in mid 2019 in the setting of progressive dyspnea and HOCM.  Cardiac MRI showed significant LVH with left ventricular outflow tract obstruction along with severe aortic stenosis and severe much regurgitation.  Right and left heart cardiac catheterization showed nonobstructive CAD and normal cardiac index.  He was subsequently referred to cardiothoracic surgery and underwent successful septal myomectomy with mechanical aortic valve replacement and mechanical mitral valve replacement.  Postoperative course complicated by anemia requiring transfusion as well as pleural effusion requiring  thoracentesis.  He also suffers from complete heart block and required placement of a [employer] single lead permanent pacemaker.  He was discharged to rehabilitation and then in September 2019, he was admitted to [facility] in the setting of GI bleeding with supratherapeutic INR.  EGD showed duodenal ulcer with a visible vessel that was treated with hemo-spray.  Follow-up echocardiogram October 2019 showed normal LV function with appropriately functioning mechanical valves and improved pulmonary artery systolic pressure.  In February, he suffered from vestibular neuritis in the right ear with significant hearing loss and balance problems.  He was seen by ENT and treated with steroids but reports minimal improvement.  He was also diagnosed with mediastinal lymphadenopathy but he is currently being monitored by oncology. He has been stable from a cardiac standpoint with no significant chest pain or shortness of breath.  Nonetheless, he lost his ability to be active and exercise due to his balance issues.  The patient does not have symptoms concerning for COVID-19 infection (fever, chills, cough, or new shortness of breath).    Past Medical History:  Diagnosis Date   Aortic stenosis    a. 01/2016 echo: mod AS; b. 04/2018 Cardiac MRI: sev AS; c. 05/2018 s/p mech AVR @ time of myomectomy; d. 07/2018 Echo: Mild AS/AI.   Atherosclerosis of aorta ([place])    by CT scan in past   Atrial fibrillation (HCC)    and aflutter. pt has a left atrial circuit that is not ablated. was on amiodarone-stopped, now use rate control.    Bladder cancer (HCC)    hx; treated with BCG in past    Carotid artery disease (HCC)    There was calcified plaque but no stenosis  by carotid artery screening done at [facility] October, 2013   CHB (complete heart block) [facility])    a. 05/2018 s/p BSX VVIR PPM ([facility]) in setting of bradycardia following myomectomy and AVR/MVR.   Diabetes mellitus    type  not specified   Diabetic neuropathy ([place])    feet   Elevated liver enzymes    over time; hx   Excessive sweating    Glaucoma    Gout    Head injury    when slipped on ice 2004-2005. stabilized and back on Coumadin    Headache    migraines - distant past   History of cardiac cath    a. 05/2018 Cath ([facility]): Non-obstructive CAD. Mildly elevated filling pressures w/ nl CI.   HOCM (hypertrophic obstructive cardiomyopathy) ([place])    a. 01/2016 Echo: EF 65-70%, no rwma, LVOT gradient of 80-64mmHg, mod AS, SAM; b. 11/2017 Echo: EF 55-60%, near cavity obliteration in systole, mod AS, mild MS; c. 04/2018 Card MRI ([facility]): Sev LVH, EF >70%, Sev AS, LVOT obs w/ Sev MR, mild to mod TR/PR, mid-myocardial HE in basal-mid anteroseptum and inferoseptum; d. 05/2018 s/p Septal myomectomy; e. 07/2018 Echo: EF 60-65%. PASP 9mmHg.   Homocystinemia ([place])    elevated, mild    Hypercholesterolemia    treated.    Hypertension    Infection of right inner ear    Kidney mass    a. s/p laproscopic surgery woth cryoablation of a mass outside kidney followed at [facility]; b. 11/2018 CT Chest: 2cm R kidney mass.   Mediastinal adenopathy    a. 12/208 CT Chest: up to 8mm LLL lung nodule. 1.5-1.7cm subcarinal/mediastinal adenopahty/right paratracheal lymph node; b. 11/2018 CT Chest: 2.5-3cm mediastinal adenopathy.   Mitral regurgitation    a. 04/2018 Cardiac MRI: sev MR; b. 05/2018 s/p mech MVR @ time of myomectomy; c. 07/2018 Echo: Mild MS.   Motion sickness    ocean boats   Nausea    Nonobstructive coronary artery disease    a. 05/2018 Cath ([facility]): nonobs CAD.   Obstructive sleep apnea    CPAP started successfully 2014   Orthostatic hypotension    a. orthostatic. dehydration. hospitalized 11/11   Sleep apnea    Significant obstructive sleep apnea diagnosed in August, 2012, the patient is to receive CPAP    Stroke [facility])    "2 old strokes" CT and MRI.  hospital 11/11. diagnosis was dehydration, no  acutal reports.    TSH elevation    on synthroid historically    Unsteady gait    August, 2012   Past Surgical History:  Procedure Laterality Date   BLADDER SURGERY     CATARACT EXTRACTION W/PHACO Left 05/11/2015   Procedure: CATARACT EXTRACTION PHACO AND INTRAOCULAR LENS PLACEMENT ([place]);  Surgeon: Leandrew Koyanagi, MD;  Location: [facility];  Service: Ophthalmology;  Laterality: Left;  DIABETIC - oral meds, CPAP   ESOPHAGOGASTRODUODENOSCOPY (EGD) WITH PROPOFOL N/A 07/01/2018   Procedure: ESOPHAGOGASTRODUODENOSCOPY (EGD) WITH PROPOFOL;  Surgeon: [name], MD;  Location: [facility];  Service: Endoscopy;  Laterality: N/A;   INSERT / REPLACE / [place]     "froze mass"   MECHANICAL AORTIC AND MITRAL VALVE REPLACEMENT  05/2018   [facility]    TONSILLECTOMY     VALVE REPLACEMENT       Current Meds  Medication Sig   aspirin EC 81 MG tablet Take 1 tablet (81 mg total) by mouth  daily.   glucose blood (ONE TOUCH ULTRA TEST) test strip USE ONE STRIP TO CHECK GLUCOSE ONCE DAILY   latanoprost (XALATAN) 0.005 % ophthalmic solution Place 1 drop into both eyes at bedtime.    levothyroxine (SYNTHROID, LEVOTHROID) 50 MCG tablet TAKE 1 TABLET BY MOUTH ONCE DAILY   metFORMIN (GLUCOPHAGE) 1000 MG tablet Take 1 tablet (1,000 mg total) by mouth daily with breakfast.   metoprolol tartrate (LOPRESSOR) 25 MG tablet Take 12.5 mg by mouth 2 (two) times daily.   ONE TOUCH ULTRA TEST test strip USE TO TEST BLOOD SUGAR ONCE DAILY   ONETOUCH DELICA LANCETS 67E MISC USE ONE LANCET TO CHECK BLOOD GLUCOSE ONCE DAILY   ONETOUCH DELICA LANCETS 93Y MISC USE TO TEST BLOOD SUGAR ONCE DAILY   pantoprazole (PROTONIX) 40 MG tablet Take 1 tablet (40 mg total) by mouth 2 (two) times daily. (Patient taking differently: Take 40 mg by mouth daily. )   simvastatin (ZOCOR) 40 MG tablet TAKE 1/2 (ONE-HALF) TABLET BY MOUTH AT BEDTIME   warfarin  (COUMADIN) 3 MG tablet TAKE ONE AND ONE-HALF TO TWO TABLETS BY MOUTH DAILY AS DIRECTED BY COUMADIN CLINIC     Allergies:   Macrolides and ketolides; Mycinettes; Nitrofuran derivatives; Nitrofurantoin; Erythromycin; Zofran [ondansetron hcl-dextrose]; and Zofran [ondansetron hcl]   Social History   Tobacco Use   Smoking status: Former Smoker    Types: Cigars, Cigarettes    Last attempt to quit: 10/02/1974    Years since quitting: 44.4   Smokeless tobacco: Never Used   Tobacco comment: still smokes cigars once a day  Substance Use Topics   Alcohol use: No    Frequency: Never   Drug use: No     Family Hx: The patient's family history includes Arrhythmia in his brother and father; Breast cancer in his mother; Prostate cancer in his father; Stroke in his father. There is no history of Heart attack or Hypertension.  ROS:   Please see the history of present illness.     All other systems reviewed and are negative.   Prior CV studies:   The following studies were reviewed today:  Echocardiogram in October 2019: - Left ventricle: The cavity size was normal. There was mild focal   basal and mild concentric hypertrophy of the septum. Systolic   function was normal. The estimated ejection fraction was in the   range of 60% to 65%. Wall motion was normal; there were no   regional wall motion abnormalities. The study is not technically   sufficient to allow evaluation of LV diastolic function. - Aortic valve: A mechanical prosthesis was present. There was mild   stenosis. There was mild regurgitation. VTI (S): 39 cm. Mean   gradient (S): 8 mm Hg. - Mitral valve: A mechanical prosthesis was present. The findings   are consistent with mild stenosis. Mean gradient (D): 6 mm Hg. - Left atrium: The atrium was severely dilated. - Right ventricle: Systolic function was normal. - Pulmonary arteries: Systolic pressure was mildly elevated. PA   peak pressure: 36 mm Hg (S).   Labs/Other  Tests and Data Reviewed:    EKG:  No ECG reviewed.  Recent Labs: 07/10/2018: TSH 2.960 12/01/2018: ALT 13; B Natriuretic Peptide 340.0; BUN 20; Creatinine, Ser 1.06; Magnesium 2.1; Potassium 3.7; Sodium 137 12/02/2018: Hemoglobin 12.5; Platelets 228   Recent Lipid Panel Lab Results  Component Value Date/Time   CHOL 147 11/12/2017 08:32 AM   TRIG 120 11/12/2017 08:32 AM   HDL 41 11/12/2017  08:32 AM   CHOLHDL 4.1 12/10/2016 09:53 AM   LDLCALC 82 11/12/2017 08:32 AM    Wt Readings from Last 3 Encounters:  03/06/19 190 lb (86.2 kg)  12/10/18 191 lb 12.8 oz (87 kg)  12/09/18 191 lb (86.6 kg)     Objective:    Vital Signs:  BP 131/87    Pulse 70    Ht 5\' 9"  (1.753 m)    Wt 190 lb (86.2 kg)    BMI 28.06 kg/m    VITAL SIGNS:  reviewed GEN:  no acute distress EYES:  sclerae anicteric, EOMI - Extraocular Movements Intact RESPIRATORY:  normal respiratory effort, symmetric expansion SKIN:  no rash, lesions or ulcers. MUSCULOSKELETAL:  no obvious deformities. NEURO:  alert and oriented x 3, no obvious focal deficit PSYCH:  normal affect  ASSESSMENT & PLAN:    1.  Hypertrophic obstructive cardiomyopathy: Status post septal myomectomy at [facility] in the summer 2019 with the addition of mechanical mitral and aortic valve replacements.  Follow-up echo in October showed normal LV function with properly functioning valves.  He is stable from a cardiac standpoint.  2.  Valvular heart disease: Status post mechanical mitral and aortic valve replacements.  He is chronically anticoagulated with Coumadin and followed closely in our Coumadin clinic.  Normal functioning valves on echo in October 2019.  Continue low-dose aspirin as well.  3.  Complete heart block: This developed following septal myomectomy.  He is status post single lead Product/process development scientist and this is followed at [employer].  4.  Permanent atrial fibrillation: Rate controlled on beta-blocker therapy.  Anticoagulated with  Coumadin.  5.  Essential hypertension: Blood pressure has been elevated lately.  We can consider adding amlodipine.  5.  Nonobstructive CAD: Minimal CAD on catheterization in August 2019.  Continue aspirin, beta-blocker, and statin therapy.  7.  Hyperlipidemia: He remains on statin therapy.  9.  Duodenal ulcer/history of GI bleed: Admission in   COVID-19 Education: The signs and symptoms of COVID-19 were discussed with the patient and how to seek care for testing (follow up with PCP or arrange E-visit).  The importance of social distancing was discussed today.  Time:   Today, I have spent 13 minutes with the patient with telehealth technology discussing the above problems.     Medication Adjustments/Labs and Tests Ordered: Current medicines are reviewed at length with the patient today.  Concerns regarding medicines are outlined above.   Tests Ordered: No orders of the defined types were placed in this encounter.   Medication Changes: No orders of the defined types were placed in this encounter.   Disposition:  Follow up in 3 month(s)  Signed, Kathlyn Sacramento, MD  03/06/2019 2:48 PM    [place]

## 2019-03-06 NOTE — Patient Instructions (Signed)
Medication Instructions:  Continue same medications If you need a refill on your cardiac medications before your next appointment, please call your pharmacy.   Lab work: None If you have labs (blood work) drawn today and your tests are completely normal, you will receive your results only by: . MyChart Message (if you have MyChart) OR . A paper copy in the mail If you have any lab test that is abnormal or we need to change your treatment, we will call you to review the results.  Testing/Procedures: None  Follow-Up: At [facility], you and your health needs are our priority.  As part of our continuing mission to provide you with exceptional heart care, we have created designated Provider Care Teams.  These Care Teams include your primary Cardiologist (physician) and Advanced Practice Providers (APPs -  Physician Assistants and Nurse Practitioners) who all work together to provide you with the care you need, when you need it. You will need a follow up appointment in 3 months.  Please call our office 2 months in advance to schedule this appointment.  You may see Casten Floren, MD or one of the following Advanced Practice Providers on your designated Care Team:   Christopher Berge, NP Ryan Dunn, PA-C . Jacquelyn Visser, PA-C     

## 2019-03-10 ENCOUNTER — Telehealth: Payer: Self-pay | Admitting: Gastroenterology

## 2019-03-10 DIAGNOSIS — G4733 Obstructive sleep apnea (adult) (pediatric): Secondary | ICD-10-CM | POA: Diagnosis not present

## 2019-03-10 NOTE — Telephone Encounter (Signed)
Pt wife left vm for Ginger to see if pt colonoscopy could wait un til late June July or if Dr. Allen Norris recommends to have this done now?  Please call her at [phone]

## 2019-03-10 NOTE — Telephone Encounter (Signed)
Jasmine Awe called asking for Ginger to call about patient.

## 2019-03-11 ENCOUNTER — Other Ambulatory Visit: Payer: Self-pay

## 2019-03-11 DIAGNOSIS — R9389 Abnormal findings on diagnostic imaging of other specified body structures: Secondary | ICD-10-CM

## 2019-03-11 NOTE — Telephone Encounter (Signed)
Duplicate message. 

## 2019-03-11 NOTE — Telephone Encounter (Signed)
Spoke with pt's wife and have scheduled pt for a colonoscopy at [facility] on 04/14/19.

## 2019-03-12 ENCOUNTER — Other Ambulatory Visit: Payer: Self-pay

## 2019-03-19 DIAGNOSIS — G4733 Obstructive sleep apnea (adult) (pediatric): Secondary | ICD-10-CM | POA: Diagnosis not present

## 2019-03-21 ENCOUNTER — Other Ambulatory Visit: Payer: Self-pay | Admitting: Cardiovascular Disease

## 2019-03-23 ENCOUNTER — Other Ambulatory Visit: Payer: Self-pay

## 2019-03-23 ENCOUNTER — Telehealth: Payer: Self-pay

## 2019-03-23 MED ORDER — WARFARIN SODIUM 3 MG PO TABS: 3.0000 mg | ORAL_TABLET | ORAL | 0 refills | Status: DC

## 2019-03-23 NOTE — Telephone Encounter (Signed)
Please review for refill. Thanks!  

## 2019-03-23 NOTE — Telephone Encounter (Signed)
Attempted to contact pt to prescreen for COVID19 prior to INR check on Wed, 6/8. Left message on pt's vm that I will be seeing him in the office at that time, to enter thru the [facility] entrance of [facility] and to wear a mask. Asked him to call back w/ any questions or concerns.

## 2019-03-25 ENCOUNTER — Ambulatory Visit: Payer: PPO

## 2019-03-25 ENCOUNTER — Other Ambulatory Visit: Payer: Self-pay

## 2019-03-25 DIAGNOSIS — I4892 Unspecified atrial flutter: Secondary | ICD-10-CM | POA: Diagnosis not present

## 2019-03-25 DIAGNOSIS — Z5181 Encounter for therapeutic drug level monitoring: Secondary | ICD-10-CM | POA: Diagnosis not present

## 2019-03-25 DIAGNOSIS — I482 Chronic atrial fibrillation, unspecified: Secondary | ICD-10-CM | POA: Diagnosis not present

## 2019-03-25 DIAGNOSIS — Z950 Presence of cardiac pacemaker: Secondary | ICD-10-CM | POA: Diagnosis not present

## 2019-03-25 DIAGNOSIS — I4891 Unspecified atrial fibrillation: Secondary | ICD-10-CM | POA: Diagnosis not present

## 2019-03-25 DIAGNOSIS — Z952 Presence of prosthetic heart valve: Secondary | ICD-10-CM

## 2019-03-25 LAB — POCT INR: INR: 3.9 — AB (ref 2.0–3.0)

## 2019-03-25 MED ORDER — ENOXAPARIN SODIUM 80 MG/0.8ML ~~LOC~~ SOLN: 80.0000 mg | Freq: Two times a day (BID) | SUBCUTANEOUS | 1 refills | Status: DC

## 2019-03-25 NOTE — Patient Instructions (Addendum)
Please take 1 tablet tonight, then continue dosage of 1.5 tablets every day EXCEPT 2 TABLETS ON FRIDAYS until 6/25, when you start Lovenox bridging instructions.  Recheck on 7/6.   Wednesday, 6/24: Last dose of Coumadin.  Thursday, 6/25: No Coumadin or Lovenox.  Friday, 6/26: Inject Lovenox 80 mg in the fatty abdominal tissue at least 2 inches from the belly button twice a day about 12 hours apart, 8am and 8pm rotate sites. No Coumadin.  Saturday, 6/27: Inject Lovenox in the fatty tissue every 12 hours, 8am and 8pm. No Coumadin.  Sunday, 6/28: Inject Lovenox in the fatty tissue every 12 hours, 8am and 8pm. No Coumadin.  Monday, 6/29: Inject Lovenox in the fatty tissue in the morning at 8 am (No PM dose). No Coumadin.  Tuesday, 6/30: Procedure Day - No Lovenox - Resume Coumadin in the evening or as directed by doctor (take an extra half tablet with usual dose for 2 days then resume normal dose).  Wednesday, 7/1: Resume Lovenox inject in the fatty tissue every 12 hours and take Coumadin.  Thursday, 7/2: Inject Lovenox in the fatty tissue every 12 hours and take Coumadin.  Friday, 7/3: Inject Lovenox in the fatty tissue every 12 hours and take Coumadin.  Saturday, 7/4: Inject Lovenox in the fatty tissue every 12 hours and take Coumadin.  Sunday, 7/5: Inject Lovenox in the fatty tissue every 12 hours and take Coumadin.  Monday, 7/6: Coumadin appt to check INR.

## 2019-04-01 ENCOUNTER — Telehealth: Payer: Self-pay | Admitting: Family Medicine

## 2019-04-01 ENCOUNTER — Ambulatory Visit (INDEPENDENT_AMBULATORY_CARE_PROVIDER_SITE_OTHER): Payer: PPO | Admitting: Family Medicine

## 2019-04-01 ENCOUNTER — Encounter: Payer: Self-pay | Admitting: Family Medicine

## 2019-04-01 ENCOUNTER — Other Ambulatory Visit: Payer: Self-pay

## 2019-04-01 VITALS — BP 144/85 | HR 76 | Temp 98.5°F | Resp 16 | Wt 202.0 lb

## 2019-04-01 DIAGNOSIS — I421 Obstructive hypertrophic cardiomyopathy: Secondary | ICD-10-CM

## 2019-04-01 DIAGNOSIS — I4891 Unspecified atrial fibrillation: Secondary | ICD-10-CM

## 2019-04-01 DIAGNOSIS — I10 Essential (primary) hypertension: Secondary | ICD-10-CM | POA: Diagnosis not present

## 2019-04-01 DIAGNOSIS — I4892 Unspecified atrial flutter: Secondary | ICD-10-CM

## 2019-04-01 DIAGNOSIS — E119 Type 2 diabetes mellitus without complications: Secondary | ICD-10-CM

## 2019-04-01 DIAGNOSIS — G4733 Obstructive sleep apnea (adult) (pediatric): Secondary | ICD-10-CM

## 2019-04-01 DIAGNOSIS — R590 Localized enlarged lymph nodes: Secondary | ICD-10-CM | POA: Diagnosis not present

## 2019-04-01 DIAGNOSIS — H9313 Tinnitus, bilateral: Secondary | ICD-10-CM

## 2019-04-01 DIAGNOSIS — R2681 Unsteadiness on feet: Secondary | ICD-10-CM

## 2019-04-01 DIAGNOSIS — R5383 Other fatigue: Secondary | ICD-10-CM

## 2019-04-01 DIAGNOSIS — I482 Chronic atrial fibrillation, unspecified: Secondary | ICD-10-CM

## 2019-04-01 DIAGNOSIS — C649 Malignant neoplasm of unspecified kidney, except renal pelvis: Secondary | ICD-10-CM

## 2019-04-01 LAB — POCT GLYCOSYLATED HEMOGLOBIN (HGB A1C): Hemoglobin A1C: 6.9 % — AB (ref 4.0–5.6)

## 2019-04-01 NOTE — Progress Notes (Deleted)
Patient: [name] [name] Male    DOB: 1934/02/13   85 y.o.   MRN: [id number] Visit Date: 04/01/2019  Today's Provider: [name], MD   Chief Complaint  Patient presents with  . Hypertension   Subjective:     HPI  Patient returns to office today for follow up.  Allergies  Allergen Reactions  . Macrolides And Ketolides Other (See Comments)  . Mycinettes   . Nitrofuran Derivatives Other (See Comments)  . Nitrofurantoin Other (See Comments)  . Erythromycin Itching and Rash    Other reaction(s): UNKNOWN  And red skin  . Zofran [Ondansetron Hcl-Dextrose] Rash  . Zofran [Ondansetron Hcl] Rash     Current Outpatient Medications:  .  aspirin EC 81 MG tablet, Take 1 tablet (81 mg total) by mouth daily., Disp: , Rfl:  .  enoxaparin (LOVENOX) 80 MG/0.8ML injection, Inject 0.8 mLs (80 mg total) into the skin every 12 (twelve) hours., Disp: 20 Syringe, Rfl: 1 .  glucose blood (ONE TOUCH ULTRA TEST) test strip, USE ONE STRIP TO CHECK GLUCOSE ONCE DAILY, Disp: 100 each, Rfl: 12 .  latanoprost (XALATAN) 0.005 % ophthalmic solution, Place 1 drop into both eyes at bedtime. , Disp: , Rfl:  .  levothyroxine (SYNTHROID, LEVOTHROID) 50 MCG tablet, TAKE 1 TABLET BY MOUTH ONCE DAILY, Disp: 90 tablet, Rfl: 3 .  metFORMIN (GLUCOPHAGE) 1000 MG tablet, Take 1 tablet (1,000 mg total) by mouth daily with breakfast., Disp: 90 tablet, Rfl: 3 .  metoprolol tartrate (LOPRESSOR) 25 MG tablet, Take 12.5 mg by mouth 2 (two) times daily., Disp: , Rfl:  .  ONE TOUCH ULTRA TEST test strip, USE TO TEST BLOOD SUGAR ONCE DAILY, Disp: 50 each, Rfl: 25 .  ONETOUCH DELICA LANCETS 29H MISC, USE ONE LANCET TO CHECK BLOOD GLUCOSE ONCE DAILY, Disp: 100 each, Rfl: 12 .  ONETOUCH DELICA LANCETS 37J MISC, USE TO TEST BLOOD SUGAR ONCE DAILY, Disp: 100 each, Rfl: 12 .  pantoprazole (PROTONIX) 40 MG tablet, Take 1 tablet (40 mg total) by mouth 2 (two) times daily. (Patient taking differently: Take 40 mg by mouth  daily. ), Disp: 60 tablet, Rfl: 3 .  simvastatin (ZOCOR) 40 MG tablet, TAKE 1/2 (ONE-HALF) TABLET BY MOUTH AT BEDTIME, Disp: 45 tablet, Rfl: 3 .  warfarin (COUMADIN) 3 MG tablet, Take 1 tablet (3 mg total) by mouth as directed. By the coumadin clinic., Disp: 40 tablet, Rfl: 0 .  ALPRAZolam (XANAX) 0.25 MG tablet, Take 1 tablet (0.25 mg total) by mouth every 6 (six) hours as needed for anxiety. (Patient not taking: Reported on 01/19/2019), Disp: 60 tablet, Rfl: 0 .  oseltamivir (TAMIFLU) 75 MG capsule, Take 1 capsule (75 mg total) by mouth daily. (Patient not taking: Reported on 01/19/2019), Disp: 10 capsule, Rfl: 0  Review of Systems  Social History   Tobacco Use  . Smoking status: Former Smoker    Types: Cigars, Cigarettes    Quit date: 10/02/1974    Years since quitting: 44.5  . Smokeless tobacco: Never Used  . Tobacco comment: still smokes cigars once a day  Substance Use Topics  . Alcohol use: No    Frequency: Never      Objective:   BP (!) 144/85   Pulse 76   Temp 98.5 F (36.9 C) (Oral)   Resp 16   Wt 202 lb (91.6 kg)   BMI 29.83 kg/m  Vitals:   04/01/19 1343  BP: (!) 144/85  Pulse: 76  Resp: 16  Temp: 98.5 F (36.9 C)  TempSrc: Oral  Weight: 202 lb (91.6 kg)     Physical Exam      Assessment & Plan        [name], MD  [facility] Fritzi Mandes Tori Dattilio,acting as a scribe for [name], MD.,have documented all relevant documentation on the behalf of [name], MD,as directed by  [name], MD while in the presence of [name], MD.

## 2019-04-01 NOTE — Chronic Care Management (AMB) (Signed)
Chronic Care Management   Note  04/01/2019 Name: SHASHWAT CLEARY MRN: [id number] DOB: 17-Mar-1934  Shelbie Ammons Gora is a 85 y.o. year old male who is a primary care patient of [name], MD. I reached out to [name] by phone today in response to a referral sent by Mr. Shelbie Ammons Steinhoff's health plan.    Mr. Robinette was given information about Chronic Care Management services today including:  1. CCM service includes personalized support from designated clinical staff supervised by his physician, including individualized plan of care and coordination with other care providers 2. 24/7 contact phone numbers for assistance for urgent and routine care needs. 3. Service will only be billed when office clinical staff spend 20 minutes or more in a month to coordinate care. 4. Only one practitioner may furnish and bill the service in a calendar month. 5. The patient may stop CCM services at any time (effective at the end of the month) by phone call to the office staff. 6. The patient will be responsible for cost sharing (co-pay) of up to 20% of the service fee (after annual deductible is met).  Patient agreed to services and verbal consent obtained.   Follow up plan: Telephone appointment with CCM team member scheduled for: 04/06/2019  [place]  ??[email or web address]   ??[phone]

## 2019-04-01 NOTE — Progress Notes (Signed)
Patient: [name] [name], Male    DOB: November 28, 1933, 85 y.o.   MRN: [id number] Visit Date: 04/01/2019  Today's Provider: [name], MD   Chief Complaint  Patient presents with  . Medicare Wellness   Subjective:     Annual wellness visit [name] [name] is a 85 y.o. male. He feels fairly well. He reports exercising by walking and doing small at home exercisies. He reports he is sleeping fairly well. He is getting his energy back and he has been eating well. He has been sluggish but improving.  -----------------------------------------------------------   Review of Systems  Constitutional: Positive for activity change and fatigue. Negative for appetite change, chills, diaphoresis, fever and unexpected weight change.  HENT: Positive for hearing loss.   Eyes: Negative.   Respiratory: Positive for shortness of breath.   Cardiovascular: Negative.   Gastrointestinal: Negative.   Endocrine: Negative.   Genitourinary: Negative.   Musculoskeletal: Positive for arthralgias. Negative for back pain, gait problem, joint swelling, myalgias, neck pain and neck stiffness.  Neurological: Positive for weakness. Negative for tremors.  Hematological: Negative.     Social History   Socioeconomic History  . Marital status: Married    Spouse name: [name] [name]  . Number of children: 1  . Years of education: 12  . Highest education level: Bachelor's degree (e.g., BA, AB, BS)  Occupational History  . Occupation: retired  Scientific laboratory technician  . Financial resource strain: Not hard at all  . Food insecurity    Worry: Never true    Inability: Never true  . Transportation needs    Medical: No    Non-medical: No  Tobacco Use  . Smoking status: Former Smoker    Types: Cigars, Cigarettes    Quit date: 10/02/1974    Years since quitting: 44.5  . Smokeless tobacco: Never Used  . Tobacco comment: still smokes cigars once a day  Substance and Sexual Activity  . Alcohol use: No   Frequency: Never  . Drug use: No  . Sexual activity: Yes  Lifestyle  . Physical activity    Days per week: 0 days    Minutes per session: 0 min  . Stress: Only a little  Relationships  . Social Herbalist on phone: Patient refused    Gets together: Patient refused    Attends religious service: Patient refused    Active member of club or organization: Patient refused    Attends meetings of clubs or organizations: Patient refused    Relationship status: Patient refused  . Intimate partner violence    Fear of current or ex partner: Patient refused    Emotionally abused: Patient refused    Physically abused: Patient refused    Forced sexual activity: Patient refused  Other Topics Concern  . Not on file  Social History Narrative   Married, retired, gets regular exercise.     Past Medical History:  Diagnosis Date  . Aortic stenosis    a. 01/2016 echo: mod AS; b. 04/2018 Cardiac MRI: sev AS; c. 05/2018 s/p mech AVR @ time of myomectomy; d. 07/2018 Echo: Mild AS/AI.  [name] [name] Atherosclerosis of aorta ([place])    by CT scan in past  . Atrial fibrillation ([name] [name])    and aflutter. pt has a left atrial circuit that is not ablated. was on amiodarone-stopped, now use rate control.   . Bladder cancer ([name] [name])    hx; treated with BCG in past   . Carotid  artery disease ([name] [name])    There was calcified plaque but no stenosis by carotid artery screening done at [facility] October, 2013  . CHB (complete heart block) ([name] [name])    a. 05/2018 s/p BSX VVIR PPM ([name] [name]) in setting of bradycardia following myomectomy and AVR/MVR.  . Diabetes mellitus    type not specified  . Diabetic neuropathy (HCC)    feet  . Elevated liver enzymes    over time; hx  . Excessive sweating   . Glaucoma   . Gout   . Head injury    when slipped on ice 2004-2005. stabilized and back on Coumadin   . Headache    migraines - distant past  . History of cardiac cath    a. 05/2018 Cath ([name] [name]): Non-obstructive CAD.  Mildly elevated filling pressures w/ nl CI.  [name] [name] HOCM (hypertrophic obstructive cardiomyopathy) ([place])    a. 01/2016 Echo: EF 65-70%, no rwma, LVOT gradient of 80-19mmHg, mod AS, SAM; b. 11/2017 Echo: EF 55-60%, near cavity obliteration in systole, mod AS, mild MS; c. 04/2018 Card MRI ([name] [name]): Sev LVH, EF >70%, Sev AS, LVOT obs w/ Sev MR, mild to mod TR/PR, mid-myocardial HE in basal-mid anteroseptum and inferoseptum; d. 05/2018 s/p Septal myomectomy; e. 07/2018 Echo: EF 60-65%. PASP 15mmHg.  [name] [name] Homocystinemia ([name] [name])    elevated, mild   . Hypercholesterolemia    treated.   . Hypertension   . Infection of right inner ear   . Kidney mass    a. s/p laproscopic surgery woth cryoablation of a mass outside kidney followed at [facility]; b. 11/2018 CT Chest: 2cm R kidney mass.  . Mediastinal adenopathy    a. 12/208 CT Chest: up to 42mm LLL lung nodule. 1.5-1.7cm subcarinal/mediastinal adenopahty/right paratracheal lymph node; b. 11/2018 CT Chest: 2.5-3cm mediastinal adenopathy.  . Mitral regurgitation    a. 04/2018 Cardiac MRI: sev MR; b. 05/2018 s/p mech MVR @ time of myomectomy; c. 07/2018 Echo: Mild MS.  . Motion sickness    ocean boats  . Nausea   . Nonobstructive coronary artery disease    a. 05/2018 Cath ([name] [name]): nonobs CAD.  [name] [name] Obstructive sleep apnea    CPAP started successfully 2014  . Orthostatic hypotension    a. orthostatic. dehydration. hospitalized 11/11  . Sleep apnea    Significant obstructive sleep apnea diagnosed in August, 2012, the patient is to receive CPAP   . Stroke [facility])    "2 old strokes" CT and MRI.  hospital 11/11. diagnosis was dehydration, no acutal reports.   . TSH elevation    on synthroid historically   . Unsteady gait    August, 2012     Patient Active Problem List   Diagnosis Date Noted  . Cancer of right kidney ([name] [name]) 12/10/2018  . Pulmonary nodules 12/01/2018  . Mediastinal lymphadenopathy 12/01/2018  . Heart block AV third degree ([name] [name]) 10/07/2018  . Pacemaker  10/07/2018  . H/O aortic valve replacement 10/07/2018  . Blood in stool   . Bleeding duodenal ulcer   . GIB (gastrointestinal bleeding) 06/30/2018  . Acute upper GI bleed   . Symptomatic anemia   . Heart murmur, systolic 67/89/3810  . H/O: rheumatic fever 03/21/2017  . Tinnitus 10/30/2016  . On warfarin therapy 07/06/2015  . Arteriosclerosis of coronary artery 06/13/2015  . Carcinoma in situ of bladder 06/13/2015  . Diabetes mellitus, type 2 ([place]) 06/13/2015  . Diabetic neuropathy ([place]) 06/13/2015  . Dizziness 06/13/2015  . Essential (primary) hypertension 06/13/2015  . Fatigue 06/13/2015  .  Glaucoma 06/13/2015  . Hypercholesteremia 06/13/2015  . Male hypogonadism 06/13/2015  . Adult hypothyroidism 06/13/2015  . Arthritis, degenerative 06/13/2015  . CAP (community acquired pneumonia) 06/13/2015  . Adenocarcinoma, renal cell ([place]) 06/13/2015  . Benign head tremor 12/13/2014  . Carotid arterial disease ([place]) 07/12/2014  . Decreased cardiac output 07/12/2014  . Cardiomyopathy, hypertrophic obstructive ([place]) 07/12/2014  . Head injuries 07/12/2014  . HOCM (hypertrophic obstructive cardiomyopathy) ([place])   . Encounter for therapeutic drug monitoring 11/16/2013  . Obstructive sleep apnea   . Benign prostatic hypertrophy without urinary obstruction 06/09/2012  . History of neoplasm of bladder 06/09/2012  . Head injury   . Atherosclerosis of aorta ([place])   . Hypotension   . Stroke ([place])   . Unsteady gait   . Excessive sweating   . Nausea   . ORTHOSTATIC HYPOTENSION, HX OF 09/01/2010  . Overweight(278.02) 03/07/2009  . MITRAL REGURGITATION 03/05/2009  . Chronic atrial fibrillation 03/05/2009  . Atrial fibrillation and flutter ([place]) 03/05/2009  . Mitral and aortic incompetence 03/05/2009    Past Surgical History:  Procedure Laterality Date  . BLADDER SURGERY    . CATARACT EXTRACTION W/PHACO Left 05/11/2015   Procedure: CATARACT EXTRACTION PHACO AND INTRAOCULAR LENS PLACEMENT  (IOC);  Surgeon: Leandrew Koyanagi, MD;  Location: [place];  Service: Ophthalmology;  Laterality: Left;  DIABETIC - oral meds, CPAP  . ESOPHAGOGASTRODUODENOSCOPY (EGD) WITH PROPOFOL N/A 07/01/2018   Procedure: ESOPHAGOGASTRODUODENOSCOPY (EGD) WITH PROPOFOL;  Surgeon: [name], MD;  Location: [facility] ENDOSCOPY;  Service: Endoscopy;  Laterality: N/A;  . INSERT / REPLACE / REMOVE PACEMAKER     Chemical engineer   . KIDNEY SURGERY     "froze mass"  . MECHANICAL AORTIC AND MITRAL VALVE REPLACEMENT  05/2018   [name] [name]   . TONSILLECTOMY    . VALVE REPLACEMENT      His family history includes Arrhythmia in his brother and father; Breast cancer in his mother; Prostate cancer in his father; Stroke in his father. There is no history of Heart attack or Hypertension.   Current Outpatient Medications:  .  aspirin EC 81 MG tablet, Take 1 tablet (81 mg total) by mouth daily., Disp: , Rfl:  .  enoxaparin (LOVENOX) 80 MG/0.8ML injection, Inject 0.8 mLs (80 mg total) into the skin every 12 (twelve) hours., Disp: 20 Syringe, Rfl: 1 .  glucose blood (ONE TOUCH ULTRA TEST) test strip, USE ONE STRIP TO CHECK GLUCOSE ONCE DAILY, Disp: 100 each, Rfl: 12 .  latanoprost (XALATAN) 0.005 % ophthalmic solution, Place 1 drop into both eyes at bedtime. , Disp: , Rfl:  .  levothyroxine (SYNTHROID, LEVOTHROID) 50 MCG tablet, TAKE 1 TABLET BY MOUTH ONCE DAILY, Disp: 90 tablet, Rfl: 3 .  metFORMIN (GLUCOPHAGE) 1000 MG tablet, Take 1 tablet (1,000 mg total) by mouth daily with breakfast., Disp: 90 tablet, Rfl: 3 .  metoprolol tartrate (LOPRESSOR) 25 MG tablet, Take 12.5 mg by mouth 2 (two) times daily., Disp: , Rfl:  .  ONE TOUCH ULTRA TEST test strip, USE TO TEST BLOOD SUGAR ONCE DAILY, Disp: 50 each, Rfl: 25 .  ONETOUCH DELICA LANCETS 01B MISC, USE ONE LANCET TO CHECK BLOOD GLUCOSE ONCE DAILY, Disp: 100 each, Rfl: 12 .  ONETOUCH DELICA LANCETS 51W MISC, USE TO TEST BLOOD SUGAR ONCE DAILY, Disp: 100 each, Rfl: 12 .   pantoprazole (PROTONIX) 40 MG tablet, Take 1 tablet (40 mg total) by mouth 2 (two) times daily. (Patient taking differently: Take 40 mg by mouth daily. ), Disp:  60 tablet, Rfl: 3 .  simvastatin (ZOCOR) 40 MG tablet, TAKE 1/2 (ONE-HALF) TABLET BY MOUTH AT BEDTIME, Disp: 45 tablet, Rfl: 3 .  warfarin (COUMADIN) 3 MG tablet, Take 1 tablet (3 mg total) by mouth as directed. By the coumadin clinic., Disp: 40 tablet, Rfl: 0 .  ALPRAZolam (XANAX) 0.25 MG tablet, Take 1 tablet (0.25 mg total) by mouth every 6 (six) hours as needed for anxiety. (Patient not taking: Reported on 01/19/2019), Disp: 60 tablet, Rfl: 0 .  oseltamivir (TAMIFLU) 75 MG capsule, Take 1 capsule (75 mg total) by mouth daily. (Patient not taking: Reported on 01/19/2019), Disp: 10 capsule, Rfl: 0  Patient Care Team: [name], MD as PCP - General (Unknown Physician Specialty) Wellington Hampshire, MD as PCP - Cardiology (Cardiology) Margaretha Sheffield, MD (Otolaryngology) Leandrew Koyanagi, MD as Referring Physician (Ophthalmology) Sharlotte Alamo, DPM as Consulting Physician (Podiatry) Bernardo Heater, Ronda Fairly, MD as Consulting Physician (Urology) Jannet Mantis, MD as Consulting Physician (Dermatology) Telford Nab, RN as Registered Nurse [name], MD as Consulting Physician (Gastroenterology) Farrel Demark, Deanne Coffer, PA-C (Cardiology) Cathi Roan, [facility] (Pharmacist)    Objective:    Vitals: BP (!) 144/85   Pulse 76   Temp 98.5 F (36.9 C) (Oral)   Resp 16   Wt 202 lb (91.6 kg)   BMI 29.83 kg/m   Physical Exam Vitals signs reviewed.  Constitutional:      Appearance: He is well-developed.  HENT:     Head: Normocephalic and atraumatic.     Right Ear: External ear normal.     Left Ear: External ear normal.     Nose: Nose normal.  Eyes:     General: No scleral icterus.    Conjunctiva/sclera: Conjunctivae normal.  Neck:     Thyroid: No thyromegaly.     Vascular: No JVD.  Cardiovascular:     Rate and  Rhythm: Normal rate and regular rhythm.     Heart sounds: Normal heart sounds.  Pulmonary:     Effort: Pulmonary effort is normal.     Breath sounds: Normal breath sounds.  Abdominal:     Palpations: Abdomen is soft.  Musculoskeletal:        General: No swelling.     Right lower leg: No edema.     Left lower leg: No edema.  Skin:    General: Skin is warm and dry.  Neurological:     General: No focal deficit present.     Mental Status: He is alert and oriented to person, place, and time. Mental status is at baseline.  Psychiatric:        Mood and Affect: Mood normal.        Behavior: Behavior normal.        Thought Content: Thought content normal.        Judgment: Judgment normal.     Activities of Daily Living In your present state of health, do you have any difficulty performing the following activities: 01/19/2019 12/01/2018  Hearing? Y Y  Comment Due to a recent ear infection in the right ear. Wears hearing aids.  -  Vision? N N  Comment Wears eye glasses daily.  -  Difficulty concentrating or making decisions? N N  Walking or climbing stairs? N Y  Dressing or bathing? N Y  Doing errands, shopping? Y N  Comment Not driving currently due to balance issues from ear infection.  -  Preparing Food and eating ? N -  Using the  Toilet? N -  In the past six months, have you accidently leaked urine? N -  Do you have problems with loss of bowel control? N -  Managing your Medications? N -  Managing your Finances? N -  Housekeeping or managing your Housekeeping? N -  Some recent data might be hidden    Fall Risk Assessment Fall Risk  01/19/2019 09/22/2018 07/08/2018 11/11/2017 10/30/2016  Falls in the past year? 0 0 No Yes No  Number falls in past yr: - - - 1 -  Injury with Fall? - - - No -  Comment - - - bruising -  Risk Factor Category  - - - - -  Follow up - - - Falls prevention discussed -     Depression Screen PHQ 2/9 Scores 01/19/2019 09/22/2018 07/08/2018 11/11/2017  PHQ - 2  Score 0 0 0 0  PHQ- 9 Score - 1 - 0    6CIT Screen 01/19/2019  What Year? 0 points  What month? 0 points  What time? 0 points  Count back from 20 0 points  Months in reverse 0 points  Repeat phrase 0 points  Total Score 0      Assessment & Plan:     Annual Wellness Visit  Reviewed patient's Family Medical History Reviewed and updated list of patient's medical providers Assessment of cognitive impairment was done Assessed patient's functional ability Established a written schedule for health screening [place] Completed and Reviewed  Exercise Activities and Dietary recommendations Goals    . DIET - INCREASE WATER INTAKE     Recommend increasing water intake to 6-8 glasses a day and cutting back on diet sodas.     . Increase water intake     Starting 10/30/16, I will increase my water intake to 4 glasses a day.       Immunization History  Administered Date(s) Administered  . Influenza, High Dose Seasonal PF 07/17/2017, 07/05/2018  . Influenza-Unspecified 08/15/2013  . Pneumococcal Conjugate-13 04/08/2014  . Pneumococcal Polysaccharide-23 08/07/2003  . Td 11/23/2009  . Zoster 12/27/2008    Health Maintenance  Topic Date Due  . URINE MICROALBUMIN  10/30/2017  . HEMOGLOBIN A1C  01/08/2019  . INFLUENZA VACCINE  05/16/2019  . OPHTHALMOLOGY EXAM  08/23/2019  . FOOT EXAM  11/13/2019  . TETANUS/TDAP  11/24/2019  . PNA vac Low Risk Adult  Completed     Discussed health benefits of physical activity, and encouraged him to engage in regular exercise appropriate for his age and condition.  1. Type 2 diabetes mellitus without complication, without long-term current use of insulin (HCC)  - POCT glycosylated hemoglobin (Hb A1C)--6.9 today--was 6.3.  2. Mediastinal lymphadenopathy Likely Lymphoma--pt /wife feels better about prognosis More than 50% 25 minute visit spent in counseling or coordination of care  3. Chronic atrial fibrillation   4.  Essential (primary) hypertension   5. Cardiomyopathy, hypertrophic obstructive (HCC) S/p Myomectomy.  6. Atrial fibrillation and flutter ([place])   7. Obstructive sleep apnea   8. Renal cell carcinoma, unspecified laterality ([place])   9. Tinnitus of both ears   10. Unsteady gait Discussed fall risk.  11. Other fatigue Slowly improving.    ------------------------------------------------------------------------------------------------------------   I have done the exam and reviewed the above chart and it is accurate to the best of my knowledge. Development worker, community has been used in this note in any air is in the dictation or transcription are unintentional.  [name], MD  [facility]  [facility]

## 2019-04-06 ENCOUNTER — Ambulatory Visit (INDEPENDENT_AMBULATORY_CARE_PROVIDER_SITE_OTHER): Payer: PPO | Admitting: Pharmacist

## 2019-04-06 DIAGNOSIS — I4892 Unspecified atrial flutter: Secondary | ICD-10-CM

## 2019-04-06 DIAGNOSIS — I482 Chronic atrial fibrillation, unspecified: Secondary | ICD-10-CM

## 2019-04-06 DIAGNOSIS — I4891 Unspecified atrial fibrillation: Secondary | ICD-10-CM

## 2019-04-06 DIAGNOSIS — Z7901 Long term (current) use of anticoagulants: Secondary | ICD-10-CM

## 2019-04-06 NOTE — Chronic Care Management (AMB) (Signed)
Chronic Care Management   Note  04/06/2019 Name: ARISTOTLE LIEB MRN: [id number] DOB: [birth year]  Subjective:  Referral received from patient's health plan. Patient seen by Dr. Rosanna Randy 6/17. PMH significant for mechanical valve replacement, afib, T2DM, cardiomyopathy, HTN, OSA, hx of GI bleed, tremor, neuropathy, glaucoma.   Patient has upcoming colonoscopy procedure on June 30. Patient was hospitalized in [facility] in February for dizziness, with no real conclusiveness though we discussed at length the bills they have continued to receive from [employer].   Objective: Lab Results  Component Value Date   CREATININE 1.06 12/01/2018   CREATININE 1.01 07/29/2018   CREATININE 0.80 07/10/2018    Lab Results  Component Value Date   HGBA1C 6.9 (A) 04/01/2019    Lipid Panel     Component Value Date/Time   CHOL 147 11/12/2017 0832   TRIG 120 11/12/2017 0832   HDL 41 11/12/2017 0832   CHOLHDL 4.1 12/10/2016 0953   LDLCALC 82 11/12/2017 0832    BP Readings from Last 3 Encounters:  04/01/19 (!) 144/85  03/06/19 (!) 143/83  12/10/18 (!) 162/70    Allergies  Allergen Reactions  . Macrolides And Ketolides Other (See Comments)  . Mycinettes   . Nitrofuran Derivatives Other (See Comments)  . Nitrofurantoin Other (See Comments)  . Erythromycin Itching and Rash    Other reaction(s): UNKNOWN  And red skin  . Zofran [Ondansetron Hcl-Dextrose] Rash  . Zofran [Ondansetron Hcl] Rash    Medications Reviewed Today    Reviewed by Cathi Roan, [facility] (Pharmacist) on 04/06/19 at 1447  Med List Status: <None>  Medication Order Taking? Sig Documenting Provider Last Dose Status Informant  ALPRAZolam (XANAX) 0.25 MG tablet [id number] No Take 1 tablet (0.25 mg total) by mouth every 6 (six) hours as needed for anxiety.  Patient not taking: Reported on 01/19/2019   [name], MD Not Taking Active   aspirin EC 81 MG tablet [id number] Yes Take 1 tablet (81 mg total) by mouth daily. Deboraha Sprang, MD Taking Active Spouse/Significant Other  enoxaparin (LOVENOX) 80 MG/0.8ML injection [id number] No Inject 0.8 mLs (80 mg total) into the skin every 12 (twelve) hours.  Patient not taking: Reported on 04/06/2019   Minna Merritts, MD Not Taking Active            Med Note Noel Christmas Apr 06, 2019  2:34 PM) Starts Friday 6/26 to prep for colonoscopy  glucose blood (ONE TOUCH ULTRA TEST) test strip [id number] Yes USE ONE STRIP TO CHECK GLUCOSE ONCE DAILY [name], MD Taking Active Spouse/Significant Other  latanoprost (XALATAN) 0.005 % ophthalmic solution [id number] Yes Place 1 drop into both eyes at bedtime.  [provider] Taking Active Spouse/Significant Other           Med Note Joya Salm Sep 18, 2017  9:28 PM)    levothyroxine Wilmer Floor, LEVOTHROID) 50 MCG tablet [id number] Yes TAKE 1 TABLET BY MOUTH ONCE DAILY [name], MD Taking Active Spouse/Significant Other  metFORMIN (GLUCOPHAGE) 1000 MG tablet [id number] Yes Take 1 tablet (1,000 mg total) by mouth daily with breakfast. [name], MD Taking Active Spouse/Significant Other  metoprolol tartrate (LOPRESSOR) 25 MG tablet [id number] Yes Take 12.5 mg by mouth 2 (two) times daily. [provider] Taking Active Spouse/Significant Other  ONE TOUCH ULTRA TEST test strip [id number] Yes USE TO TEST BLOOD SUGAR ONCE DAILY [name], MD Taking  Active Spouse/Significant Other  Jonetta Speak LANCETS 04U MISC [id number] Yes USE ONE LANCET TO CHECK BLOOD GLUCOSE ONCE DAILY [name], MD Taking Active Spouse/Significant Other  [facility] LANCETS 38U MISC [id number] Yes USE TO TEST BLOOD SUGAR ONCE DAILY [name], MD Taking Active Spouse/Significant Other       Patient not taking:      Discontinued 04/06/19 1445 (Completed Course)   pantoprazole (PROTONIX) 40 MG tablet [id number] Yes Take 1 tablet (40 mg total) by mouth 2  (two) times daily.  Patient taking differently: Take 40 mg by mouth daily.    Birdie Sons, MD Taking Active   simvastatin (ZOCOR) 40 MG tablet [id number] Yes TAKE 1/2 (ONE-HALF) TABLET BY MOUTH AT BEDTIME Wellington Hampshire, MD Taking Active Spouse/Significant Other  warfarin (COUMADIN) 3 MG tablet [id number] Yes Take 1 tablet (3 mg total) by mouth as directed. By the coumadin clinic. Wellington Hampshire, MD Taking Active            Med Note Noel Christmas Apr 06, 2019  2:35 PM) Last dose pre colonoscopy 04/08/19           Assessment:   Goals Addressed            This Visit's Progress   . We can review my medication (pt-stated)       Current Barriers:  . Upcoming colonoscopy . [place]  Pharmacist Clinical Goal(s):  [name] Pharmacist and Mr. [name] will review medications to make sure everything is correct prior to colonscopy procedure at the end of June 2020.   Interventions: . Comprehensive medication review . Reviewed mechanism of Coumadin and why enoxaparin bridge is necessary pre and post procedure  Patient Self Care Activities:  . Self administers medications as prescribed  Initial goal documentation        Plan:  Recommendations discussed with patient - I will send you a calendar with your Coumadin and Lovenox dosing  - I recommned you contact your [employer] concierge regarding your hostpital bills. Hospital bills should be filed under your "red white and blue" or original Medicare card.    Follow up: Telephone follow up appointment with care management team member scheduled for:  July 2 with PharmD    Ruben Reason, PharmD Clinical Pharmacist [place] [phone]

## 2019-04-07 DIAGNOSIS — E119 Type 2 diabetes mellitus without complications: Secondary | ICD-10-CM | POA: Diagnosis not present

## 2019-04-07 DIAGNOSIS — B351 Tinea unguium: Secondary | ICD-10-CM | POA: Diagnosis not present

## 2019-04-09 ENCOUNTER — Other Ambulatory Visit
Admission: RE | Admit: 2019-04-09 | Discharge: 2019-04-09 | Disposition: A | Discharge status: Home / Self Care | Payer: PPO | Source: Ambulatory Visit | Attending: Gastroenterology | Admitting: Gastroenterology

## 2019-04-09 ENCOUNTER — Other Ambulatory Visit: Payer: Self-pay

## 2019-04-09 DIAGNOSIS — Z1159 Encounter for screening for other viral diseases: Secondary | ICD-10-CM | POA: Insufficient documentation

## 2019-04-10 ENCOUNTER — Telehealth: Payer: Self-pay | Admitting: Pharmacist

## 2019-04-10 ENCOUNTER — Other Ambulatory Visit
Admission: RE | Admit: 2019-04-10 | Discharge: 2019-04-10 | Disposition: A | Discharge status: Home / Self Care | Payer: PPO | Source: Ambulatory Visit | Attending: Gastroenterology | Admitting: Gastroenterology

## 2019-04-10 DIAGNOSIS — G4733 Obstructive sleep apnea (adult) (pediatric): Secondary | ICD-10-CM | POA: Diagnosis not present

## 2019-04-10 LAB — NOVEL CORONAVIRUS, NAA (HOSP ORDER, SEND-OUT TO REF LAB; TAT 18-24 HRS): SARS-CoV-2, NAA: NOT DETECTED

## 2019-04-10 NOTE — Telephone Encounter (Signed)
Patient wife called concerned that a drop or two of lovenox was not injected. Stated that it was ok, not to worry about it.  Wife also wanted to know if ok for patient to take his ASA prior to procedure. He did take last night.  Dr. Rockey Situ and Dr. Allen Norris- is patient supposed to hold ASA prior to colonoscopy?

## 2019-04-10 NOTE — Telephone Encounter (Signed)
F/U Message             Patient's wife is calling back to check status on her husband taking the Asprin. Again this is the second call, pls advise.

## 2019-04-10 NOTE — Telephone Encounter (Signed)
Th patient can continue the Asprin.

## 2019-04-10 NOTE — Telephone Encounter (Signed)
Relayed the message to patient's wife that Dr. Allen Norris said to continue ASA. Wife appreciative of the call. Hold coumadin, continue lovenox and ASA

## 2019-04-14 ENCOUNTER — Encounter: Payer: Self-pay | Admitting: Anesthesiology

## 2019-04-14 ENCOUNTER — Ambulatory Visit: Payer: PPO | Admitting: Anesthesiology

## 2019-04-14 ENCOUNTER — Ambulatory Visit
Admission: RE | Admit: 2019-04-14 | Discharge: 2019-04-14 | Disposition: A | Discharge status: Home / Self Care | Payer: PPO | Attending: Gastroenterology | Admitting: Gastroenterology

## 2019-04-14 ENCOUNTER — Encounter
Admission: RE | Disposition: A | Discharge status: Home / Self Care | Payer: Self-pay | Source: Home / Self Care | Attending: Gastroenterology

## 2019-04-14 DIAGNOSIS — Z79899 Other long term (current) drug therapy: Secondary | ICD-10-CM | POA: Diagnosis not present

## 2019-04-14 DIAGNOSIS — Z8673 Personal history of transient ischemic attack (TIA), and cerebral infarction without residual deficits: Secondary | ICD-10-CM | POA: Insufficient documentation

## 2019-04-14 DIAGNOSIS — I2510 Atherosclerotic heart disease of native coronary artery without angina pectoris: Secondary | ICD-10-CM | POA: Insufficient documentation

## 2019-04-14 DIAGNOSIS — Z7984 Long term (current) use of oral hypoglycemic drugs: Secondary | ICD-10-CM | POA: Diagnosis not present

## 2019-04-14 DIAGNOSIS — E1140 Type 2 diabetes mellitus with diabetic neuropathy, unspecified: Secondary | ICD-10-CM | POA: Insufficient documentation

## 2019-04-14 DIAGNOSIS — I4891 Unspecified atrial fibrillation: Secondary | ICD-10-CM | POA: Insufficient documentation

## 2019-04-14 DIAGNOSIS — E7800 Pure hypercholesterolemia, unspecified: Secondary | ICD-10-CM | POA: Diagnosis not present

## 2019-04-14 DIAGNOSIS — R9389 Abnormal findings on diagnostic imaging of other specified body structures: Secondary | ICD-10-CM

## 2019-04-14 DIAGNOSIS — D120 Benign neoplasm of cecum: Secondary | ICD-10-CM | POA: Insufficient documentation

## 2019-04-14 DIAGNOSIS — I10 Essential (primary) hypertension: Secondary | ICD-10-CM | POA: Insufficient documentation

## 2019-04-14 DIAGNOSIS — E039 Hypothyroidism, unspecified: Secondary | ICD-10-CM | POA: Diagnosis not present

## 2019-04-14 DIAGNOSIS — Z7982 Long term (current) use of aspirin: Secondary | ICD-10-CM | POA: Insufficient documentation

## 2019-04-14 DIAGNOSIS — G4733 Obstructive sleep apnea (adult) (pediatric): Secondary | ICD-10-CM | POA: Diagnosis not present

## 2019-04-14 DIAGNOSIS — H409 Unspecified glaucoma: Secondary | ICD-10-CM | POA: Insufficient documentation

## 2019-04-14 DIAGNOSIS — Z952 Presence of prosthetic heart valve: Secondary | ICD-10-CM | POA: Insufficient documentation

## 2019-04-14 DIAGNOSIS — Z8551 Personal history of malignant neoplasm of bladder: Secondary | ICD-10-CM | POA: Insufficient documentation

## 2019-04-14 DIAGNOSIS — G4730 Sleep apnea, unspecified: Secondary | ICD-10-CM | POA: Diagnosis not present

## 2019-04-14 DIAGNOSIS — F17290 Nicotine dependence, other tobacco product, uncomplicated: Secondary | ICD-10-CM | POA: Diagnosis not present

## 2019-04-14 DIAGNOSIS — Z79890 Hormone replacement therapy (postmenopausal): Secondary | ICD-10-CM | POA: Diagnosis not present

## 2019-04-14 DIAGNOSIS — K635 Polyp of colon: Secondary | ICD-10-CM

## 2019-04-14 DIAGNOSIS — I442 Atrioventricular block, complete: Secondary | ICD-10-CM | POA: Diagnosis not present

## 2019-04-14 DIAGNOSIS — I868 Varicose veins of other specified sites: Secondary | ICD-10-CM | POA: Insufficient documentation

## 2019-04-14 DIAGNOSIS — R933 Abnormal findings on diagnostic imaging of other parts of digestive tract: Secondary | ICD-10-CM

## 2019-04-14 HISTORY — PX: COLONOSCOPY WITH PROPOFOL: SHX5780

## 2019-04-14 LAB — GLUCOSE, CAPILLARY: Glucose-Capillary: 96 mg/dL (ref 70–99)

## 2019-04-14 SURGERY — COLONOSCOPY WITH PROPOFOL
Anesthesia: General

## 2019-04-14 MED ORDER — PROPOFOL 500 MG/50ML IV EMUL
INTRAVENOUS | Status: AC
  Filled 2019-04-14: qty 50

## 2019-04-14 MED ORDER — LIDOCAINE HCL (PF) 2 % IJ SOLN
INTRAMUSCULAR | Status: AC
  Filled 2019-04-14: qty 10

## 2019-04-14 MED ORDER — LIDOCAINE HCL (CARDIAC) PF 100 MG/5ML IV SOSY
PREFILLED_SYRINGE | INTRAVENOUS | Status: DC | PRN
  Administered 2019-04-14: 50 mg via INTRAVENOUS

## 2019-04-14 MED ORDER — SODIUM CHLORIDE 0.9 % IV SOLN
INTRAVENOUS | Status: DC
  Administered 2019-04-14: 1000 mL via INTRAVENOUS

## 2019-04-14 MED ORDER — PROPOFOL 500 MG/50ML IV EMUL
INTRAVENOUS | Status: DC | PRN
  Administered 2019-04-14: 130 ug/kg/min via INTRAVENOUS

## 2019-04-14 MED ORDER — PROPOFOL 10 MG/ML IV BOLUS
INTRAVENOUS | Status: DC | PRN
  Administered 2019-04-14: 70 mg via INTRAVENOUS
  Administered 2019-04-14: 22 mg via INTRAVENOUS

## 2019-04-14 NOTE — H&P (Signed)
[name], MD [address] [place], [place] [address] Phone:[phone] Fax : [phone]  Primary Care Physician:  [name], MD Primary Gastroenterologist:  Dr. Allen Norris  Pre-Procedure History & Physical: HPI:  [name] [name] is a 85 y.o. male is here for an colonoscopy.   Past Medical History:  Diagnosis Date  . Aortic stenosis    a. 01/2016 echo: mod AS; b. 04/2018 Cardiac MRI: sev AS; c. 05/2018 s/p mech AVR @ time of myomectomy; d. 07/2018 Echo: Mild AS/AI.  [name] Atherosclerosis of aorta ([place])    by CT scan in past  . Atrial fibrillation ([place])    and aflutter. pt has a left atrial circuit that is not ablated. was on amiodarone-stopped, now use rate control.   . Bladder cancer ([place])    hx; treated with BCG in past   . Carotid artery disease ([place])    There was calcified plaque but no stenosis by carotid artery screening done at [facility] October, 2013  . CHB (complete heart block) ([place])    a. 05/2018 s/p BSX VVIR PPM ([facility]) in setting of bradycardia following myomectomy and AVR/MVR.  . Diabetes mellitus    type not specified  . Diabetic neuropathy ([facility])    feet  . Elevated liver enzymes    over time; hx  . Excessive sweating   . Glaucoma   . Gout   . Head injury    when slipped on ice 2004-2005. stabilized and back on Coumadin   . Headache    migraines - distant past  . History of cardiac cath    a. 05/2018 Cath ([facility]): Non-obstructive CAD. Mildly elevated filling pressures w/ nl CI.  [name] HOCM (hypertrophic obstructive cardiomyopathy) ([place])    a. 01/2016 Echo: EF 65-70%, no rwma, LVOT gradient of 80-71mmHg, mod AS, SAM; b. 11/2017 Echo: EF 55-60%, near cavity obliteration in systole, mod AS, mild MS; c. 04/2018 Card MRI ([facility]): Sev LVH, EF >70%, Sev AS, LVOT obs w/ Sev MR, mild to mod TR/PR, mid-myocardial HE in basal-mid anteroseptum and inferoseptum; d. 05/2018 s/p Septal myomectomy; e. 07/2018 Echo: EF 60-65%. PASP 60mmHg.  [name]  Homocystinemia ([place])    elevated, mild   . Hypercholesterolemia    treated.   . Hypertension   . Infection of right inner ear   . Kidney mass    a. s/p laproscopic surgery woth cryoablation of a mass outside kidney followed at [facility]; b. 11/2018 CT Chest: 2cm R kidney mass.  . Mediastinal adenopathy    a. 12/208 CT Chest: up to 47mm LLL lung nodule. 1.5-1.7cm subcarinal/mediastinal adenopahty/right paratracheal lymph node; b. 11/2018 CT Chest: 2.5-3cm mediastinal adenopathy.  . Mitral regurgitation    a. 04/2018 Cardiac MRI: sev MR; b. 05/2018 s/p mech MVR @ time of myomectomy; c. 07/2018 Echo: Mild MS.  . Motion sickness    ocean boats  . Nausea   . Nonobstructive coronary artery disease    a. 05/2018 Cath ([facility]): nonobs CAD.  [name] Obstructive sleep apnea    CPAP started successfully 2014  . Orthostatic hypotension    a. orthostatic. dehydration. hospitalized 11/11  . Sleep apnea    Significant obstructive sleep apnea diagnosed in August, 2012, the patient is to receive CPAP   . Stroke [facility])    "2 old strokes" CT and MRI. [place] [facility] 11/11. diagnosis was dehydration, no acutal reports.   . TSH elevation    on synthroid historically   . Unsteady gait  August, 2012    Past Surgical History:  Procedure Laterality Date  . BLADDER SURGERY    . CATARACT EXTRACTION W/PHACO Left 05/11/2015   Procedure: CATARACT EXTRACTION PHACO AND INTRAOCULAR LENS PLACEMENT (IOC);  Surgeon: Leandrew Koyanagi, MD;  Location: [place];  Service: Ophthalmology;  Laterality: Left;  DIABETIC - oral meds, CPAP  . ESOPHAGOGASTRODUODENOSCOPY (EGD) WITH PROPOFOL N/A 07/01/2018   Procedure: ESOPHAGOGASTRODUODENOSCOPY (EGD) WITH PROPOFOL;  Surgeon: [name], MD;  Location: [facility] ENDOSCOPY;  Service: Endoscopy;  Laterality: N/A;  . INSERT / REPLACE / REMOVE PACEMAKER     Chemical engineer   . KIDNEY SURGERY     "froze mass"  . MECHANICAL AORTIC AND MITRAL VALVE REPLACEMENT  05/2018   [facility]   .  TONSILLECTOMY    . VALVE REPLACEMENT      Prior to Admission medications   Medication Sig Start Date End Date Taking? Authorizing Provider  ALPRAZolam (XANAX) 0.25 MG tablet Take 1 tablet (0.25 mg total) by mouth every 6 (six) hours as needed for anxiety. 12/17/18  Yes [name], MD  aspirin EC 81 MG tablet Take 1 tablet (81 mg total) by mouth daily. 09/04/18  Yes Deboraha Sprang, MD  enoxaparin (LOVENOX) 80 MG/0.8ML injection Inject 0.8 mLs (80 mg total) into the skin every 12 (twelve) hours. 03/25/19  Yes Minna Merritts, MD  glucose blood (ONE TOUCH ULTRA TEST) test strip USE ONE STRIP TO CHECK GLUCOSE ONCE DAILY 03/25/17  Yes [name], MD  latanoprost (XALATAN) 0.005 % ophthalmic solution Place 1 drop into both eyes at bedtime.  11/13/13  Yes [provider]  levothyroxine (SYNTHROID, LEVOTHROID) 50 MCG tablet TAKE 1 TABLET BY MOUTH ONCE DAILY 08/10/18  Yes [name], MD  metFORMIN (GLUCOPHAGE) 1000 MG tablet Take 1 tablet (1,000 mg total) by mouth daily with breakfast. 08/19/18 08/19/19 Yes [name], MD  metoprolol tartrate (LOPRESSOR) 25 MG tablet Take 12.5 mg by mouth 2 (two) times daily.   Yes [provider]  ONE TOUCH ULTRA TEST test strip USE TO TEST BLOOD SUGAR ONCE DAILY 05/02/18  Yes [name], MD  [facility] Dt DELICA LANCETS 54Y MISC USE ONE LANCET TO CHECK BLOOD GLUCOSE ONCE DAILY 03/25/17  Yes [name], MD  [facility] DELICA LANCETS 70W MISC USE TO TEST BLOOD SUGAR ONCE DAILY 05/02/18  Yes [name], MD  pantoprazole (PROTONIX) 40 MG tablet Take 1 tablet (40 mg total) by mouth 2 (two) times daily. Patient taking differently: Take 40 mg by mouth daily.  03/04/19  Yes Birdie Sons, MD  simvastatin (ZOCOR) 40 MG tablet TAKE 1/2 (ONE-HALF) TABLET BY MOUTH AT BEDTIME 05/05/18  Yes Wellington Hampshire, MD  warfarin (COUMADIN) 3 MG tablet Take 1 tablet (3 mg total) by mouth as directed. By  the coumadin clinic. 03/23/19  Yes Wellington Hampshire, MD    Allergies as of 03/11/2019 - Review Complete 03/06/2019  Allergen Reaction Noted  . Macrolides and ketolides Other (See Comments) 06/13/2015  . Mycinettes  06/15/2011  . Nitrofuran derivatives Other (See Comments) 05/21/2014  . Nitrofurantoin Other (See Comments) 06/13/2015  . Erythromycin Itching and Rash 05/21/2014  . Zofran [ondansetron hcl-dextrose] Rash 12/05/2018  . Zofran [ondansetron hcl] Rash 12/01/2018    Family History  Problem Relation Age of Onset  . Arrhythmia Father        A-Fib  . Prostate cancer Father   . Stroke Father   .  Breast cancer Mother   . Arrhythmia Brother        A-Fib  . Heart attack Neg Hx   . Hypertension Neg Hx     Social History   Socioeconomic History  . Marital status: Married    Spouse name: Jasmine Awe  . Number of children: 1  . Years of education: 73  . Highest education level: Bachelor's degree (e.g., BA, AB, BS)  Occupational History  . Occupation: retired  Scientific laboratory technician  . Financial resource strain: Not hard at all  . Food insecurity    Worry: Never true    Inability: Never true  . Transportation needs    Medical: No    Non-medical: No  Tobacco Use  . Smoking status: Former Smoker    Types: Cigars, Cigarettes    Quit date: 10/02/1974    Years since quitting: 44.5  . Smokeless tobacco: Never Used  . Tobacco comment: still smokes cigars once a day  Substance and Sexual Activity  . Alcohol use: No    Frequency: Never  . Drug use: No  . Sexual activity: Yes  Lifestyle  . Physical activity    Days per week: 0 days    Minutes per session: 0 min  . Stress: Only a little  Relationships  . Social Herbalist on phone: Patient refused    Gets together: Patient refused    Attends religious service: Patient refused    Active member of club or organization: Patient refused    Attends meetings of clubs or organizations: Patient refused    Relationship  status: Patient refused  . Intimate partner violence    Fear of current or ex partner: Patient refused    Emotionally abused: Patient refused    Physically abused: Patient refused    Forced sexual activity: Patient refused  Other Topics Concern  . Not on file  Social History Narrative   Married, retired, gets regular exercise.     Review of Systems: See HPI, otherwise negative ROS  Physical Exam: BP (!) 152/72   Pulse 77   Temp (!) 97.3 F (36.3 C) (Tympanic)   Resp 20   Ht 5\' 9"  (1.753 m)   Wt 86.2 kg   SpO2 96%   BMI 28.06 kg/m  General:   Alert,  pleasant and cooperative in NAD Head:  Normocephalic and atraumatic. Neck:  Supple; no masses or thyromegaly. Lungs:  Clear throughout to auscultation.    Heart:  Regular rate and rhythm. Abdomen:  Soft, nontender and nondistended. Normal bowel sounds, without guarding, and without rebound.   Neurologic:  Alert and  oriented x4;  grossly normal neurologically.  Impression/Plan: [name] [name] is here for an colonoscopy to be performed for positive PET scan.  Risks, benefits, limitations, and alternatives regarding  colonoscopy have been reviewed with the patient.  Questions have been answered.  All parties agreeable.   [name], MD  04/14/2019, 10:28 AM

## 2019-04-14 NOTE — Anesthesia Preprocedure Evaluation (Signed)
Anesthesia Evaluation  Patient identified by MRN, date of birth, ID band Patient awake    Reviewed: Allergy & Precautions, NPO status , Patient's Chart, lab work & pertinent test results, reviewed documented beta blocker date and time   Airway Mallampati: III  TM Distance: >3 FB     Dental  (+) Chipped   Pulmonary sleep apnea and Continuous Positive Airway Pressure Ventilation , pneumonia, resolved, former smoker,           Cardiovascular hypertension, Pt. on medications and Pt. on home beta blockers + CAD  + dysrhythmias Atrial Fibrillation + pacemaker + Valvular Problems/Murmurs      Neuro/Psych  Headaches, CVA    GI/Hepatic PUD,   Endo/Other  diabetes, Type 2Hypothyroidism   Renal/GU Renal disease     Musculoskeletal  (+) Arthritis ,   Abdominal   Peds  Hematology  (+) anemia ,   Anesthesia Other Findings AV replace. 65-70 on Echo. LVH.  EKG shows paced beats.  Reproductive/Obstetrics                             Anesthesia Physical Anesthesia Plan  ASA: III  Anesthesia Plan: General   Post-op Pain Management:    Induction: Intravenous  PONV Risk Score and Plan:   Airway Management Planned:   Additional Equipment:   Intra-op Plan:   Post-operative Plan:   Informed Consent: I have reviewed the patients History and Physical, chart, labs and discussed the procedure including the risks, benefits and alternatives for the proposed anesthesia with the patient or authorized representative who has indicated his/her understanding and acceptance.       Plan Discussed with: CRNA  Anesthesia Plan Comments:         Anesthesia Quick Evaluation

## 2019-04-14 NOTE — Anesthesia Post-op Follow-up Note (Signed)
Anesthesia QCDR form completed.        

## 2019-04-14 NOTE — Anesthesia Postprocedure Evaluation (Signed)
Anesthesia Post Note  Patient: [name] [name]  Procedure(s) Performed: COLONOSCOPY WITH PROPOFOL (N/A )  Patient location during evaluation: Endoscopy Anesthesia Type: General Level of consciousness: awake and alert Pain management: pain level controlled Vital Signs Assessment: post-procedure vital signs reviewed and stable Respiratory status: spontaneous breathing, nonlabored ventilation, respiratory function stable and patient connected to nasal cannula oxygen Cardiovascular status: blood pressure returned to baseline and stable Postop Assessment: no apparent nausea or vomiting Anesthetic complications: no     Last Vitals:  Vitals:   04/14/19 1139 04/14/19 1149  BP: (!) 105/57 (!) 117/59  Pulse: 72 70  Resp: 10 19  Temp:    SpO2: 100% 99%    Last Pain:  Vitals:   04/14/19 1149  TempSrc:   PainSc: 0-No pain                 Nils Thor S

## 2019-04-14 NOTE — Transfer of Care (Signed)
Immediate Anesthesia Transfer of Care Note  Patient: [name] [name]  Procedure(s) Performed: COLONOSCOPY WITH PROPOFOL (N/A )  Patient Location: PACU  Anesthesia Type:General  Level of Consciousness: drowsy  Airway & Oxygen Therapy: Patient Spontanous Breathing and Patient connected to nasal cannula oxygen  Post-op Assessment: Report given to RN and Post -op Vital signs reviewed and stable  Post vital signs: Reviewed and stable  Last Vitals:  Vitals Value Taken Time  BP 88/40 04/14/19 1109  Temp 36.1 C 04/14/19 1109  Pulse 78 04/14/19 1109  Resp 18 04/14/19 1109  SpO2 91 % 04/14/19 1109    Last Pain:  Vitals:   04/14/19 1109  TempSrc: Tympanic  PainSc: Asleep         Complications: No apparent anesthesia complications

## 2019-04-14 NOTE — Op Note (Signed)
[facility] Gastroenterology Patient Name: [name] [name] Procedure Date: 04/14/2019 10:28 AM MRN: [id number] Account #: [id number] Date of Birth: 1934/07/06 Admit Type: Outpatient Age: 85 Room: [facility] ENDO ROOM 4 Gender: Male Note Status: Finalized Procedure:            Colonoscopy Indications:          Abnormal PET scan of the GI tract Providers:            [name] MD, MD Medicines:            Propofol per Anesthesia Complications:        No immediate complications. Procedure:            Pre-Anesthesia Assessment:                       - Prior to the procedure, a History and Physical was                        performed, and patient medications and allergies were                        reviewed. The patient's tolerance of previous                        anesthesia was also reviewed. The risks and benefits of                        the procedure and the sedation options and risks were                        discussed with the patient. All questions were                        answered, and informed consent was obtained. Prior                        Anticoagulants: The patient has taken no previous                        anticoagulant or antiplatelet agents. ASA Grade                        Assessment: II - A patient with mild systemic disease.                        After reviewing the risks and benefits, the patient was                        deemed in satisfactory condition to undergo the                        procedure.                       After obtaining informed consent, the colonoscope was                        passed under direct vision. Throughout the procedure,                        the patient's  blood pressure, pulse, and oxygen                        saturations were monitored continuously. The                        Colonoscope was introduced through the anus and                        advanced to the the cecum, identified by appendiceal                     orifice and ileocecal valve. The colonoscopy was                        performed without difficulty. The patient tolerated the                        procedure well. The quality of the bowel preparation                        was good. Findings:      The perianal and digital rectal examinations were normal.      A 3 mm polyp was found in the cecum. The polyp was sessile. The polyp       was removed with a cold biopsy forceps. Resection and retrieval were       complete.      Non-bleeding rectal varices were found. Impression:           - One 3 mm polyp in the cecum, removed with a cold                        biopsy forceps. Resected and retrieved.                       - Rectal varices. Recommendation:       - Discharge patient to home.                       - Resume previous diet.                       - Continue present medications.                       - Await pathology results. Procedure Code(s):    --- Professional ---                       [phone], Colonoscopy, flexible; with biopsy, single or                        multiple Diagnosis Code(s):    --- Professional ---                       R93.3, Abnormal findings on diagnostic imaging of other                        parts of digestive tract                       K63.5, Polyp of colon CPT copyright 2019 American Medical Association. All  rights reserved. The codes documented in this report are preliminary and upon coder review may  be revised to meet current compliance requirements. [name] MD, MD 04/14/2019 11:08:03 AM This report has been signed electronically. Number of Addenda: 0 Note Initiated On: 04/14/2019 10:28 AM Scope Withdrawal Time: 0 hours 7 minutes 36 seconds  Total Procedure Duration: 0 hours 15 minutes 7 seconds  Estimated Blood Loss: Estimated blood loss: none.      [facility]

## 2019-04-15 ENCOUNTER — Encounter: Payer: Self-pay | Admitting: Gastroenterology

## 2019-04-15 ENCOUNTER — Telehealth: Payer: Self-pay

## 2019-04-15 LAB — SURGICAL PATHOLOGY

## 2019-04-15 NOTE — Telephone Encounter (Signed)
Spoke w/ pt's wife. She reports that pt had his colonoscopy and was found to have a polyp that Dr. Allen Norris was concerned about bleeding after the procedure. She states that pt was advised to continue to hold coumadin an additional 2 days, though I do not see documentation of this.  Advised her that the surgeon's recommendations override mine and that she will need to call his office to find out when pt can resume coumadin. Discussed w/ her the purpose of Lovenox and that pt is protected from stroke while taking it and that the purpose of extra 1/2 dose of coumadin for 2 days is to boost his INR back to normal range so that we can stop the Lovenox injections.  She is appreciative of the call, will call Dr. Dorothey Baseman office to get further instructions on when pt can safely resume coumadin and will keep appt for INR check on Monday.  I advised that she might want to push this appt to Wed since his INR may not be in range at that time, but she would like to keep the appt on Monday.  Asked her to call back w/ any further questions or concerns before that time.

## 2019-04-15 NOTE — Telephone Encounter (Signed)
Patient had colonscopy yesterday, per his doctor the patient should not take any coumadin last night and tonight due to finding a polyp. The wife states you wanted him to double up so she would like some advice on what would be best for the patient

## 2019-04-16 ENCOUNTER — Ambulatory Visit: Payer: Self-pay | Admitting: Pharmacist

## 2019-04-16 ENCOUNTER — Telehealth: Payer: Self-pay

## 2019-04-16 DIAGNOSIS — I4891 Unspecified atrial fibrillation: Secondary | ICD-10-CM

## 2019-04-16 DIAGNOSIS — I482 Chronic atrial fibrillation, unspecified: Secondary | ICD-10-CM

## 2019-04-16 DIAGNOSIS — I4892 Unspecified atrial flutter: Secondary | ICD-10-CM

## 2019-04-16 NOTE — Telephone Encounter (Signed)

## 2019-04-17 NOTE — Chronic Care Management (AMB) (Signed)
  Chronic Care Management   Note  04/17/2019 Name: [name] [name] MRN: [id number] DOB: 17-Jan-1934  85 y.o. year old male referred to Chronic Care Management by health plan. Follow up outreach today by clinical pharmacist to review warfarin and heparin dosing following procedure.    Was unable to reach patient via telephone today and have left HIPAA compliant voicemail(unsuccessful outreach #1). No call back necessary.  Follow up plan: Telephone follow up appointment with care management team member scheduled for: 2 weeks  Ruben Reason, PharmD Clinical Pharmacist [place] [phone]

## 2019-04-20 ENCOUNTER — Ambulatory Visit: Payer: PPO

## 2019-04-20 ENCOUNTER — Other Ambulatory Visit: Payer: Self-pay

## 2019-04-20 DIAGNOSIS — Z950 Presence of cardiac pacemaker: Secondary | ICD-10-CM

## 2019-04-20 DIAGNOSIS — Z5181 Encounter for therapeutic drug level monitoring: Secondary | ICD-10-CM | POA: Diagnosis not present

## 2019-04-20 DIAGNOSIS — I4892 Unspecified atrial flutter: Secondary | ICD-10-CM | POA: Diagnosis not present

## 2019-04-20 DIAGNOSIS — I4891 Unspecified atrial fibrillation: Secondary | ICD-10-CM | POA: Diagnosis not present

## 2019-04-20 DIAGNOSIS — Z952 Presence of prosthetic heart valve: Secondary | ICD-10-CM

## 2019-04-20 DIAGNOSIS — I482 Chronic atrial fibrillation, unspecified: Secondary | ICD-10-CM | POA: Diagnosis not present

## 2019-04-20 LAB — POCT INR: INR: 1.4 — AB (ref 2.0–3.0)

## 2019-04-20 NOTE — Patient Instructions (Signed)
Please continue Lovenox injections, take 2 tablets tonight, 2 tablets tomorrow, then resume dosage of 1.5 tablets every day EXCEPT 2 TABLETS ON FRIDAYS. Recheck on Wednesday, 7/8.

## 2019-04-21 DIAGNOSIS — Z8679 Personal history of other diseases of the circulatory system: Secondary | ICD-10-CM | POA: Diagnosis not present

## 2019-04-21 DIAGNOSIS — Z45018 Encounter for adjustment and management of other part of cardiac pacemaker: Secondary | ICD-10-CM | POA: Diagnosis not present

## 2019-04-21 DIAGNOSIS — Z950 Presence of cardiac pacemaker: Secondary | ICD-10-CM | POA: Diagnosis not present

## 2019-04-22 ENCOUNTER — Ambulatory Visit: Payer: PPO

## 2019-04-22 ENCOUNTER — Other Ambulatory Visit: Payer: Self-pay | Admitting: Cardiovascular Disease

## 2019-04-22 ENCOUNTER — Other Ambulatory Visit: Payer: Self-pay

## 2019-04-22 DIAGNOSIS — I4891 Unspecified atrial fibrillation: Secondary | ICD-10-CM

## 2019-04-22 DIAGNOSIS — Z5181 Encounter for therapeutic drug level monitoring: Secondary | ICD-10-CM

## 2019-04-22 DIAGNOSIS — Z952 Presence of prosthetic heart valve: Secondary | ICD-10-CM

## 2019-04-22 DIAGNOSIS — Z950 Presence of cardiac pacemaker: Secondary | ICD-10-CM

## 2019-04-22 DIAGNOSIS — I482 Chronic atrial fibrillation, unspecified: Secondary | ICD-10-CM

## 2019-04-22 DIAGNOSIS — H401131 Primary open-angle glaucoma, bilateral, mild stage: Secondary | ICD-10-CM | POA: Diagnosis not present

## 2019-04-22 DIAGNOSIS — I4892 Unspecified atrial flutter: Secondary | ICD-10-CM | POA: Diagnosis not present

## 2019-04-22 LAB — POCT INR: INR: 2.1 (ref 2.0–3.0)

## 2019-04-22 NOTE — Patient Instructions (Signed)
STOP LOVENOX!  ; ) Please take continue dosage of 1.5 tablets every day EXCEPT 2 TABLETS ON FRIDAYS. Recheck INR in 2 weeks.

## 2019-04-23 NOTE — Telephone Encounter (Signed)
Refill Request.  

## 2019-04-24 ENCOUNTER — Other Ambulatory Visit: Payer: Self-pay | Admitting: Cardiovascular Disease

## 2019-04-24 NOTE — Telephone Encounter (Signed)
Please review for refill, Thanks !  

## 2019-04-24 NOTE — Telephone Encounter (Signed)
Refill was sent yesterday and I confirmed that they did receive. Thanks.

## 2019-04-30 ENCOUNTER — Ambulatory Visit (INDEPENDENT_AMBULATORY_CARE_PROVIDER_SITE_OTHER): Payer: PPO | Admitting: Pharmacist

## 2019-04-30 DIAGNOSIS — I482 Chronic atrial fibrillation, unspecified: Secondary | ICD-10-CM

## 2019-04-30 DIAGNOSIS — H401131 Primary open-angle glaucoma, bilateral, mild stage: Secondary | ICD-10-CM | POA: Diagnosis not present

## 2019-04-30 DIAGNOSIS — Z7901 Long term (current) use of anticoagulants: Secondary | ICD-10-CM

## 2019-04-30 DIAGNOSIS — I4892 Unspecified atrial flutter: Secondary | ICD-10-CM | POA: Diagnosis not present

## 2019-04-30 DIAGNOSIS — Z952 Presence of prosthetic heart valve: Secondary | ICD-10-CM

## 2019-04-30 DIAGNOSIS — I4891 Unspecified atrial fibrillation: Secondary | ICD-10-CM | POA: Diagnosis not present

## 2019-04-30 NOTE — Patient Instructions (Signed)
Congratulations! You have met all case management goals! You may call the case management team at any time should you have a question or if you have new case management needs. We are happy to help you! We will let your doctor know that you have met your goals.    Thank you allowing the Chronic Care Management Team to be a part of your care!   Please call a member of the CCM (Chronic Care Management) Team with any questions or case management needs in the future:   Trish Fountain, RN, BSN Nurse Care Coordinator  [phone]  Ruben Reason, PharmD  Clinical Pharmacist  [phone]

## 2019-04-30 NOTE — Chronic Care Management (AMB) (Signed)
  Chronic Care Management   Follow Up Note   04/30/2019 Name: [name] [name] MRN: [id number] DOB: Jul 19, 1934  Subjective: [name] [name] is a 85 y.o. year old male who is a primary care patient of [name], MD. The CCM clinical pharmacist was consulted by patient's health plan. Two week telephone tollow up by pharmacist today with patient/wife to review anticoagulation regimen following endoscopy procedure. HIPAA identifiers verified.  Assessment: Review of patient status, including review of consultants reports, relevant laboratory and other test results, and collaboration with appropriate care team members and the patient's provider was performed as part of comprehensive patient evaluation and provision of chronic care management services.    Goals Addressed            This Visit's Progress   . COMPLETED: We can review my medication (pt-stated)       Current Barriers:  . Upcoming colonoscopy . [place]  Pharmacist Clinical Goal(s):  [name] Pharmacist and Mr. [name] will review medications to make sure everything is correct prior to colonscopy procedure at the end of June 2020.   Interventions: . Comprehensive medication review . Reviewed mechanism of Coumadin and why enoxaparin bridge is necessary pre and post procedure  Patient Self Care Activities:  . Self administers medications as prescribed  Please see past updates related to this goal by clicking on the "Past Updates" button in the selected goal         Plan: Reviewed warfarin dose: 1.5 tablets every day except 2 tablets Fridays, next appointment with Coumadin Clinic on 05/06/19; reinforced consistent intake of leafy greens (patient admits this is an area he struggles with)  Follow up: The patient has been provided with contact information for the care management team and has been advised to call with any health related questions or concerns.  Next PCP  appointment scheduled for:  September 21,2020 with [name], PharmD Clinical Pharmacist [place] [phone]

## 2019-05-06 ENCOUNTER — Ambulatory Visit: Payer: PPO

## 2019-05-06 ENCOUNTER — Ambulatory Visit: Payer: Self-pay | Admitting: Pharmacist

## 2019-05-06 ENCOUNTER — Other Ambulatory Visit: Payer: Self-pay

## 2019-05-06 DIAGNOSIS — Z5181 Encounter for therapeutic drug level monitoring: Secondary | ICD-10-CM

## 2019-05-06 DIAGNOSIS — Z952 Presence of prosthetic heart valve: Secondary | ICD-10-CM | POA: Diagnosis not present

## 2019-05-06 DIAGNOSIS — I4891 Unspecified atrial fibrillation: Secondary | ICD-10-CM | POA: Diagnosis not present

## 2019-05-06 DIAGNOSIS — Z950 Presence of cardiac pacemaker: Secondary | ICD-10-CM

## 2019-05-06 DIAGNOSIS — I4892 Unspecified atrial flutter: Secondary | ICD-10-CM | POA: Diagnosis not present

## 2019-05-06 DIAGNOSIS — E119 Type 2 diabetes mellitus without complications: Secondary | ICD-10-CM

## 2019-05-06 DIAGNOSIS — I482 Chronic atrial fibrillation, unspecified: Secondary | ICD-10-CM

## 2019-05-06 LAB — POCT INR: INR: 3.9 — AB (ref 2.0–3.0)

## 2019-05-06 NOTE — Patient Instructions (Addendum)
Please skip coumadin tonight, then resume dosage of 1.5 tablets every day EXCEPT 2 TABLETS ON FRIDAYS. Recheck INR in 1 week since you're going to the beach.

## 2019-05-06 NOTE — Chronic Care Management (AMB) (Signed)
  Chronic Care Management   Note  05/06/2019 Name: [name] [name] MRN: [id number] DOB: 08-01-1934  Subjective: Incoming call from Jasmine Awe Jennings and Elisabeth Most. HIPAA identifiers verified.  Mr. And Mrs. Tu received notification from [place] that Shingrix vaccine was available. Wanted to know if they should receive Shingrix vaccine?   Assessment and Recommendation: Patient and wife qualify for Shingrix vaccination. Shingrix is over 90% effective at preventing shingles and immune protection remains >85% for four years following vaccination. Shingrix also is approximately 90% effective at preventing PHN. This is significant increase in efficacy over previous vaccine Zostavax and current guideline recommend revaccination. Two dose series, 2-6 months apart; Mrs. Muscatello reports copay of $45, which is reasonable to my best knowledge.   Mr. Ohlin was worried about any reactions to vaccine based on PMH. Shingrix is not a live vaccine, he has no contraindications unless he has a fever or cold symptoms today/day he receives vaccine. SE include low grade fever. Counseled patient and wife that this indicates that immune system is working and producing antibodies- please don't be alarmed in this time of [place]. Monitor for other COVID symptoms and for temp >101 degrees F.   Follow up: No further follow up required: Next appointment with Dr. Rosanna Randy scheduled for: 07/06/19  Ruben Reason, PharmD Clinical Pharmacist [place] [phone]

## 2019-05-10 DIAGNOSIS — G4733 Obstructive sleep apnea (adult) (pediatric): Secondary | ICD-10-CM | POA: Diagnosis not present

## 2019-05-13 ENCOUNTER — Other Ambulatory Visit: Payer: Self-pay

## 2019-05-13 ENCOUNTER — Other Ambulatory Visit: Payer: Self-pay | Admitting: Family Medicine

## 2019-05-13 ENCOUNTER — Ambulatory Visit: Payer: PPO

## 2019-05-13 DIAGNOSIS — I4892 Unspecified atrial flutter: Secondary | ICD-10-CM | POA: Diagnosis not present

## 2019-05-13 DIAGNOSIS — Z5181 Encounter for therapeutic drug level monitoring: Secondary | ICD-10-CM

## 2019-05-13 DIAGNOSIS — Z952 Presence of prosthetic heart valve: Secondary | ICD-10-CM | POA: Diagnosis not present

## 2019-05-13 DIAGNOSIS — I482 Chronic atrial fibrillation, unspecified: Secondary | ICD-10-CM | POA: Diagnosis not present

## 2019-05-13 DIAGNOSIS — Z950 Presence of cardiac pacemaker: Secondary | ICD-10-CM | POA: Diagnosis not present

## 2019-05-13 DIAGNOSIS — I4891 Unspecified atrial fibrillation: Secondary | ICD-10-CM | POA: Diagnosis not present

## 2019-05-13 LAB — POCT INR: INR: 3.6 — AB (ref 2.0–3.0)

## 2019-05-13 MED ORDER — WARFARIN SODIUM 3 MG PO TABS: ORAL_TABLET | ORAL | 1 refills | Status: DC

## 2019-05-13 NOTE — Patient Instructions (Signed)
Please have a serving of greens tonight and continue dosage of 1.5 tablets every day EXCEPT 2 TABLETS ON FRIDAYS. Recheck INR in 3 weeks.

## 2019-05-22 ENCOUNTER — Other Ambulatory Visit: Payer: Self-pay | Admitting: *Deleted

## 2019-05-22 ENCOUNTER — Telehealth: Payer: Self-pay | Admitting: Cardiovascular Disease

## 2019-05-22 MED ORDER — METOPROLOL TARTRATE 25 MG PO TABS: 12.5000 mg | ORAL_TABLET | Freq: Two times a day (BID) | ORAL | 1 refills | Status: DC

## 2019-05-22 NOTE — Telephone Encounter (Signed)
Requested Prescriptions   Signed Prescriptions Disp Refills  . metoprolol tartrate (LOPRESSOR) 25 MG tablet 30 tablet 1    Sig: Take 0.5 tablets (12.5 mg total) by mouth 2 (two) times daily.    Authorizing Provider: Kathlyn [place] A    Ordering User: Britt Bottom

## 2019-05-22 NOTE — Telephone Encounter (Signed)
°*  STAT* If patient is at the pharmacy, call can be transferred to refill team.   1. Which medications need to be refilled? (please list name of each medication and dose if known)    Metoprolol 12.5 mg po BID   2. Which pharmacy/location (including street and city if local pharmacy) is medication to be sent to?  [place] Do they need a 30 day or 90 day supply? [place]

## 2019-06-03 ENCOUNTER — Ambulatory Visit: Payer: PPO

## 2019-06-03 ENCOUNTER — Other Ambulatory Visit: Payer: Self-pay

## 2019-06-03 DIAGNOSIS — Z5181 Encounter for therapeutic drug level monitoring: Secondary | ICD-10-CM

## 2019-06-03 DIAGNOSIS — I4892 Unspecified atrial flutter: Secondary | ICD-10-CM | POA: Diagnosis not present

## 2019-06-03 DIAGNOSIS — I482 Chronic atrial fibrillation, unspecified: Secondary | ICD-10-CM

## 2019-06-03 DIAGNOSIS — I4891 Unspecified atrial fibrillation: Secondary | ICD-10-CM

## 2019-06-03 DIAGNOSIS — Z952 Presence of prosthetic heart valve: Secondary | ICD-10-CM

## 2019-06-03 DIAGNOSIS — Z950 Presence of cardiac pacemaker: Secondary | ICD-10-CM

## 2019-06-03 LAB — POCT INR: INR: 3.1 — AB (ref 2.0–3.0)

## 2019-06-03 NOTE — Patient Instructions (Signed)
Please continue dosage of 1.5 tablets every day EXCEPT 2 TABLETS ON FRIDAYS. Recheck INR in 4 weeks.

## 2019-06-04 ENCOUNTER — Ambulatory Visit
Admission: RE | Admit: 2019-06-04 | Discharge: 2019-06-04 | Disposition: A | Discharge status: Home / Self Care | Payer: PPO | Source: Ambulatory Visit | Attending: Internal Medicine | Admitting: Internal Medicine

## 2019-06-04 DIAGNOSIS — Z950 Presence of cardiac pacemaker: Secondary | ICD-10-CM | POA: Diagnosis not present

## 2019-06-04 DIAGNOSIS — I421 Obstructive hypertrophic cardiomyopathy: Secondary | ICD-10-CM | POA: Diagnosis not present

## 2019-06-04 DIAGNOSIS — Z7901 Long term (current) use of anticoagulants: Secondary | ICD-10-CM | POA: Diagnosis not present

## 2019-06-04 DIAGNOSIS — R0602 Shortness of breath: Secondary | ICD-10-CM | POA: Diagnosis not present

## 2019-06-04 DIAGNOSIS — I442 Atrioventricular block, complete: Secondary | ICD-10-CM | POA: Diagnosis not present

## 2019-06-04 DIAGNOSIS — R5383 Other fatigue: Secondary | ICD-10-CM | POA: Diagnosis not present

## 2019-06-04 DIAGNOSIS — R590 Localized enlarged lymph nodes: Secondary | ICD-10-CM

## 2019-06-04 DIAGNOSIS — Z45018 Encounter for adjustment and management of other part of cardiac pacemaker: Secondary | ICD-10-CM | POA: Diagnosis not present

## 2019-06-08 ENCOUNTER — Telehealth: Payer: Self-pay | Admitting: *Deleted

## 2019-06-08 NOTE — Telephone Encounter (Signed)
Lab apt cnl per md order

## 2019-06-08 NOTE — Telephone Encounter (Signed)
[name] [name]-no labs needed.  Cancel labs.

## 2019-06-08 NOTE — Telephone Encounter (Signed)
Wife called reporting that patient had appointment at [facility] Thursday and had extensive lab work done [employer], BMP, TSH, BNP, and wants to know if he needs labs repeated Wednesday, Please advise

## 2019-06-08 NOTE — Telephone Encounter (Signed)
Left message on voice mail that lab appointment has been cancelled

## 2019-06-09 ENCOUNTER — Ambulatory Visit
Admission: RE | Admit: 2019-06-09 | Discharge: 2019-06-09 | Disposition: A | Discharge status: Home / Self Care | Payer: PPO | Source: Ambulatory Visit | Attending: Internal Medicine | Admitting: Internal Medicine

## 2019-06-09 ENCOUNTER — Other Ambulatory Visit: Payer: Self-pay

## 2019-06-09 DIAGNOSIS — K7460 Unspecified cirrhosis of liver: Secondary | ICD-10-CM | POA: Diagnosis not present

## 2019-06-09 DIAGNOSIS — R911 Solitary pulmonary nodule: Secondary | ICD-10-CM | POA: Insufficient documentation

## 2019-06-09 DIAGNOSIS — Z8551 Personal history of malignant neoplasm of bladder: Secondary | ICD-10-CM | POA: Diagnosis not present

## 2019-06-09 DIAGNOSIS — I2510 Atherosclerotic heart disease of native coronary artery without angina pectoris: Secondary | ICD-10-CM | POA: Insufficient documentation

## 2019-06-09 DIAGNOSIS — R590 Localized enlarged lymph nodes: Secondary | ICD-10-CM | POA: Insufficient documentation

## 2019-06-09 DIAGNOSIS — I700 Atherosclerosis of aorta: Secondary | ICD-10-CM | POA: Diagnosis not present

## 2019-06-09 LAB — GLUCOSE, CAPILLARY: Glucose-Capillary: 114 mg/dL — ABNORMAL HIGH (ref 70–99)

## 2019-06-09 MED ORDER — FLUDEOXYGLUCOSE F - 18 (FDG) INJECTION
9.8000 | Freq: Once | INTRAVENOUS | Status: AC | PRN
  Administered 2019-06-09: 13:00:00 10.45 via INTRAVENOUS

## 2019-06-10 ENCOUNTER — Other Ambulatory Visit: Payer: Self-pay

## 2019-06-10 ENCOUNTER — Inpatient Hospital Stay: Payer: PPO

## 2019-06-10 ENCOUNTER — Encounter: Payer: Self-pay | Admitting: Internal Medicine

## 2019-06-10 ENCOUNTER — Inpatient Hospital Stay: Payer: PPO | Attending: Internal Medicine | Admitting: Internal Medicine

## 2019-06-10 DIAGNOSIS — Z7982 Long term (current) use of aspirin: Secondary | ICD-10-CM | POA: Insufficient documentation

## 2019-06-10 DIAGNOSIS — K7460 Unspecified cirrhosis of liver: Secondary | ICD-10-CM | POA: Insufficient documentation

## 2019-06-10 DIAGNOSIS — Z8673 Personal history of transient ischemic attack (TIA), and cerebral infarction without residual deficits: Secondary | ICD-10-CM | POA: Diagnosis not present

## 2019-06-10 DIAGNOSIS — I119 Hypertensive heart disease without heart failure: Secondary | ICD-10-CM | POA: Diagnosis not present

## 2019-06-10 DIAGNOSIS — I421 Obstructive hypertrophic cardiomyopathy: Secondary | ICD-10-CM | POA: Diagnosis not present

## 2019-06-10 DIAGNOSIS — R2689 Other abnormalities of gait and mobility: Secondary | ICD-10-CM | POA: Diagnosis not present

## 2019-06-10 DIAGNOSIS — I4891 Unspecified atrial fibrillation: Secondary | ICD-10-CM | POA: Diagnosis not present

## 2019-06-10 DIAGNOSIS — Z7901 Long term (current) use of anticoagulants: Secondary | ICD-10-CM | POA: Insufficient documentation

## 2019-06-10 DIAGNOSIS — C641 Malignant neoplasm of right kidney, except renal pelvis: Secondary | ICD-10-CM | POA: Insufficient documentation

## 2019-06-10 DIAGNOSIS — E1140 Type 2 diabetes mellitus with diabetic neuropathy, unspecified: Secondary | ICD-10-CM | POA: Diagnosis not present

## 2019-06-10 DIAGNOSIS — R590 Localized enlarged lymph nodes: Secondary | ICD-10-CM | POA: Diagnosis not present

## 2019-06-10 DIAGNOSIS — G588 Other specified mononeuropathies: Secondary | ICD-10-CM | POA: Insufficient documentation

## 2019-06-10 DIAGNOSIS — Z79899 Other long term (current) drug therapy: Secondary | ICD-10-CM | POA: Insufficient documentation

## 2019-06-10 DIAGNOSIS — G4733 Obstructive sleep apnea (adult) (pediatric): Secondary | ICD-10-CM | POA: Insufficient documentation

## 2019-06-10 DIAGNOSIS — Z7984 Long term (current) use of oral hypoglycemic drugs: Secondary | ICD-10-CM | POA: Insufficient documentation

## 2019-06-10 DIAGNOSIS — I2510 Atherosclerotic heart disease of native coronary artery without angina pectoris: Secondary | ICD-10-CM | POA: Diagnosis not present

## 2019-06-10 DIAGNOSIS — E7800 Pure hypercholesterolemia, unspecified: Secondary | ICD-10-CM | POA: Insufficient documentation

## 2019-06-10 DIAGNOSIS — F17290 Nicotine dependence, other tobacco product, uncomplicated: Secondary | ICD-10-CM | POA: Diagnosis not present

## 2019-06-10 NOTE — Progress Notes (Signed)
[facility] NOTE  Patient Care Team: [name], MD as PCP - General (Unknown Physician Specialty) Wellington Hampshire, MD as PCP - Cardiology (Cardiology) Margaretha Sheffield, MD (Otolaryngology) Leandrew Koyanagi, MD as Referring Physician (Ophthalmology) Sharlotte Alamo, DPM as Consulting Physician (Podiatry) Bernardo Heater, Ronda Fairly, MD as Consulting Physician (Urology) Jannet Mantis, MD as Consulting Physician (Dermatology) Telford Nab, RN as Registered Nurse [name], MD as Consulting Physician (Gastroenterology) Farrel Demark, Deanne Coffer, PA-C (Cardiology) Cathi Roan, [facility] (Pharmacist)  CHIEF COMPLAINTS/PURPOSE OF CONSULTATION:  Mediastinal adenopathy  #  Oncology History Overview Note  #February 2020 gastroenteropathy/right supraclavicular adenopathy-incidental]-PET scan 2020-low-grade FDG activity-differential low-grade lymphoma versus benign reactive process. no biopsy  #Right kidney-cancer status post cryo [[facility]]  #CAD status post CABG; mechanical valve replacement [2019 August; [facility]]; on Coumadin   Cancer of right kidney ([place])  12/10/2018 Initial Diagnosis   Cancer of right kidney [facility])     HISTORY OF PRESENTING ILLNESS:  [name] [name] 85 y.o.  male is here for follow-up to review the results of his PET scan/follow-up of mediastinal/neck adenopathy.  In the interim patient was evaluated by GI-underwent colonoscopy had a polypectomy done.  No malignancy noted.  Patient has been evaluated by ENT for his vestibular neuritis.  Is gotten steroid injection slight improvement noted.   He denies any weight loss.  Denies any nausea vomiting abdominal pain.  Denies any unusual shortness of the cough.  He has gait instability because of the neuritis walks with a cane.  No falls.  Review of Systems  Constitutional: Positive for malaise/fatigue. Negative for chills, diaphoresis, fever and weight loss.  HENT: Negative for nosebleeds and  sore throat.   Eyes: Negative for double vision.  Respiratory: Negative for cough, hemoptysis, sputum production, shortness of breath and wheezing.   Cardiovascular: Negative for chest pain, palpitations, orthopnea and leg swelling.  Gastrointestinal: Negative for abdominal pain, blood in stool, constipation, diarrhea, heartburn, melena, nausea and vomiting.  Genitourinary: Negative for dysuria, frequency and urgency.  Musculoskeletal: Negative for back pain and joint pain.  Skin: Negative.  Negative for itching and rash.  Neurological: Positive for dizziness. Negative for tingling, focal weakness, weakness and headaches.  Endo/Heme/Allergies: Does not bruise/bleed easily.  Psychiatric/Behavioral: Negative for depression. The patient is not nervous/anxious and does not have insomnia.      MEDICAL HISTORY:  Past Medical History:  Diagnosis Date  . Aortic stenosis    a. 01/2016 echo: mod AS; b. 04/2018 Cardiac MRI: sev AS; c. 05/2018 s/p mech AVR @ time of myomectomy; d. 07/2018 Echo: Mild AS/AI.  [name] Atherosclerosis of aorta ([place])    by CT scan in past  . Atrial fibrillation ([place])    and aflutter. pt has a left atrial circuit that is not ablated. was on amiodarone-stopped, now use rate control.   . Bladder cancer ([place])    hx; treated with BCG in past   . Carotid artery disease ([place])    There was calcified plaque but no stenosis by carotid artery screening done at [facility] October, 2013  . CHB (complete heart block) ([place])    a. 05/2018 s/p BSX VVIR PPM ([facility]) in setting of bradycardia following myomectomy and AVR/MVR.  . Diabetes mellitus    type not specified  . Diabetic neuropathy (H[facility])    feet  . Elevated liver enzymes    over time; hx  . Excessive sweating   . Glaucoma   . Gout   . Head  injury    when slipped on ice 2004-2005. stabilized and back on Coumadin   . Headache    migraines - distant past  . History of cardiac cath    a. 05/2018 Cath ([facility]):  Non-obstructive CAD. Mildly elevated filling pressures w/ nl CI.  [name] HOCM (hypertrophic obstructive cardiomyopathy) ([place])    a. 01/2016 Echo: EF 65-70%, no rwma, LVOT gradient of 80-43mmHg, mod AS, SAM; b. 11/2017 Echo: EF 55-60%, near cavity obliteration in systole, mod AS, mild MS; c. 04/2018 Card MRI ([facility]): Sev LVH, EF >70%, Sev AS, LVOT obs w/ Sev MR, mild to mod TR/PR, mid-myocardial HE in basal-mid anteroseptum and inferoseptum; d. 05/2018 s/p Septal myomectomy; e. 07/2018 Echo: EF 60-65%. PASP 35mmHg.  [name] Homocystinemia ([place])    elevated, mild   . Hypercholesterolemia    treated.   . Hypertension   . Infection of right inner ear   . Kidney mass    a. s/p laproscopic surgery woth cryoablation of a mass outside kidney followed at [facility]; b. 11/2018 CT Chest: 2cm R kidney mass.  . Mediastinal adenopathy    a. 12/208 CT Chest: up to 38mm LLL lung nodule. 1.5-1.7cm subcarinal/mediastinal adenopahty/right paratracheal lymph node; b. 11/2018 CT Chest: 2.5-3cm mediastinal adenopathy.  . Mitral regurgitation    a. 04/2018 Cardiac MRI: sev MR; b. 05/2018 s/p mech MVR @ time of myomectomy; c. 07/2018 Echo: Mild MS.  . Motion sickness    ocean boats  . Nausea   . Nonobstructive coronary artery disease    a. 05/2018 Cath ([facility]): nonobs CAD.  [name] Obstructive sleep apnea    CPAP started successfully 2014  . Orthostatic hypotension    a. orthostatic. dehydration. hospitalized 11/11  . Sleep apnea    Significant obstructive sleep apnea diagnosed in August, 2012, the patient is to receive CPAP   . Stroke [facility])    "2 old strokes" CT and MRI.  hospital 11/11. diagnosis was dehydration, no acutal reports.   . TSH elevation    on synthroid historically   . Unsteady gait    August, 2012    SURGICAL HISTORY: Past Surgical History:  Procedure Laterality Date  . BLADDER SURGERY    . CATARACT EXTRACTION W/PHACO Left 05/11/2015   Procedure: CATARACT EXTRACTION PHACO AND INTRAOCULAR LENS PLACEMENT (IOC);   Surgeon: Leandrew Koyanagi, MD;  Location: [facility];  Service: Ophthalmology;  Laterality: Left;  DIABETIC - oral meds, CPAP  . COLONOSCOPY WITH PROPOFOL N/A 04/14/2019   Procedure: COLONOSCOPY WITH PROPOFOL;  Surgeon: [name], MD;  Location[facility] ENDOSCOPY;  Service: Endoscopy;  Laterality: N/A;  . ESOPHAGOGASTRODUODENOSCOPY (EGD) WITH PROPOFOL N/A 07/01/2018   Procedure: ESOPHAGOGASTRODUODENOSCOPY (EGD) WITH PROPOFOL;  Surgeon: [name], MD;  Location: [facility] ENDOSCOPY;  Service: Endoscopy;  Laterality: N/A;  . INSERT / REPLACE / REMOVE PACEMAKER     Chemical engineer   . KIDNEY SURGERY     "froze mass"  . MECHANICAL AORTIC AND MITRAL VALVE REPLACEMENT  05/2018   [facility]   . TONSILLECTOMY    . VALVE REPLACEMENT      SOCIAL HISTORY: Social History   Socioeconomic History  . Marital status: Married    Spouse name: Jasmine Awe  . Number of children: 1  . Years of education: 83  . Highest education level: Bachelor's degree (e.g., BA, AB, BS)  Occupational History  . Occupation: retired  Scientific laboratory technician  . Financial resource strain: Not hard at all  . Food insecurity    Worry: Never true  Inability: Never true  . Transportation needs    Medical: No    Non-medical: No  Tobacco Use  . Smoking status: Former Smoker    Types: Cigars, Cigarettes    Quit date: 10/02/1974    Years since quitting: 44.7  . Smokeless tobacco: Never Used  . Tobacco comment: still smokes cigars once a day  Substance and Sexual Activity  . Alcohol use: No    Frequency: Never  . Drug use: No  . Sexual activity: Yes  Lifestyle  . Physical activity    Days per week: 0 days    Minutes per session: 0 min  . Stress: Only a little  Relationships  . Social Herbalist on phone: Patient refused    Gets together: Patient refused    Attends religious service: Patient refused    Active member of club or organization: Patient refused    Attends meetings of clubs or organizations:  Patient refused    Relationship status: Patient refused  . Intimate partner violence    Fear of current or ex partner: Patient refused    Emotionally abused: Patient refused    Physically abused: Patient refused    Forced sexual activity: Patient refused  Other Topics Concern  . Not on file  Social History Narrative   Married, retired, gets regular exercise.     FAMILY HISTORY: Family History  Problem Relation Age of Onset  . Arrhythmia Father        A-Fib  . Prostate cancer Father   . Stroke Father   . Breast cancer Mother   . Arrhythmia Brother        A-Fib  . Heart attack Neg Hx   . Hypertension Neg Hx     ALLERGIES:  is allergic to macrolides and ketolides; mycinettes; nitrofuran derivatives; nitrofurantoin; erythromycin; zofran [ondansetron hcl-dextrose]; and zofran [ondansetron hcl].  MEDICATIONS:  Current Outpatient Medications  Medication Sig Dispense Refill  . ALPRAZolam (XANAX) 0.25 MG tablet Take 1 tablet (0.25 mg total) by mouth every 6 (six) hours as needed for anxiety. 60 tablet 0  . aspirin EC 81 MG tablet Take 1 tablet (81 mg total) by mouth daily.    [name] glucose blood (ONE TOUCH ULTRA TEST) test strip USE ONE STRIP TO CHECK GLUCOSE ONCE DAILY 100 each 12  . latanoprost (XALATAN) 0.005 % ophthalmic solution Place 1 drop into both eyes at bedtime.     [name] levothyroxine (SYNTHROID, LEVOTHROID) 50 MCG tablet TAKE 1 TABLET BY MOUTH ONCE DAILY 90 tablet 3  . metFORMIN (GLUCOPHAGE) 1000 MG tablet Take 1 tablet (1,000 mg total) by mouth daily with breakfast. 90 tablet 3  . metoprolol tartrate (LOPRESSOR) 25 MG tablet Take 0.5 tablets (12.5 mg total) by mouth 2 (two) times daily. 30 tablet 1  . ONETOUCH DELICA LANCETS 99991111 MISC USE ONE LANCET TO CHECK BLOOD GLUCOSE ONCE DAILY 100 each 12  . ONETOUCH DELICA LANCETS 99991111 MISC USE TO TEST BLOOD SUGAR ONCE DAILY 100 each 12  . ONETOUCH ULTRA test strip USE 1 STRIP TO CHECK GLUCOSE ONCE DAILY 50 each 11  . pantoprazole  (PROTONIX) 40 MG tablet Take 1 tablet (40 mg total) by mouth 2 (two) times daily. (Patient taking differently: Take 40 mg by mouth daily. ) 60 tablet 3  . simvastatin (ZOCOR) 40 MG tablet TAKE 1/2 (ONE-HALF) TABLET BY MOUTH AT BEDTIME 45 tablet 3  . warfarin (COUMADIN) 3 MG tablet TAKE  AS DIRECTED BY COUMADIN CLINIC 45 tablet 1  No current facility-administered medications for this visit.       [name]  PHYSICAL EXAMINATION: ECOG PERFORMANCE STATUS: 0 - Asymptomatic  Vitals:   06/10/19 1343  BP: (!) 155/75  Pulse: 97  Resp: 16  Temp: 98.8 F (37.1 C)   Filed Weights   06/10/19 1343  Weight: 202 lb (91.6 kg)    Physical Exam  Constitutional: He is oriented to person, place, and time and well-developed, well-nourished, and in no distress.  Patient is walking himself.  He is walking with a cane.  HENT:  Head: Normocephalic and atraumatic.  Mouth/Throat: Oropharynx is clear and moist. No oropharyngeal exudate.  Eyes: Pupils are equal, round, and reactive to light.  Neck: Normal range of motion. Neck supple.  Cardiovascular: Normal rate and regular rhythm.  Pulmonary/Chest: Effort normal and breath sounds normal. No respiratory distress. He has no wheezes.  Abdominal: Soft. Bowel sounds are normal. He exhibits no distension and no mass. There is no abdominal tenderness. There is no rebound and no guarding.  Musculoskeletal: Normal range of motion.        General: No tenderness or edema.  Neurological: He is alert and oriented to person, place, and time.  Skin: Skin is warm.  Psychiatric: Affect normal.     LABORATORY DATA:  I have reviewed the data as listed Lab Results  Component Value Date   WBC 8.8 12/02/2018   HGB 12.5 (L) 12/02/2018   HCT 38.2 (L) 12/02/2018   MCV 92.0 12/02/2018   PLT 228 12/02/2018   Recent Labs    06/30/18 1000  07/10/18 1229 07/29/18 1526 12/01/18 0045  NA 135   < > 139 137 137  K 3.8   < > 3.8 4.7 3.7  CL 101   < > 102 99 102  CO2 25    < > 21 20 26   GLUCOSE 152*   < > 120* 150* 122*  BUN 15   < > 8 13 20   CREATININE 0.88   < > 0.80 1.01 1.06  CALCIUM 8.8*   < > 9.2 9.5 9.0  GFRNONAA >60   < > 83 69 >60  GFRAA >60   < > 96 80 >60  PROT 7.8  --  7.2  --  8.0  ALBUMIN 3.3*  --  3.6  --  3.7  AST 48*  --  22  --  25  ALT 36  --  9  --  13  ALKPHOS 81  --  85  --  58  BILITOT 0.5  --  0.3  --  1.0   < > = values in this interval not displayed.    RADIOGRAPHIC STUDIES: I have personally reviewed the radiological images as listed and agreed with the findings in the report. Nm Pet Image Restag (ps) Skull Base To Thigh  Result Date: 06/09/2019 CLINICAL DATA:  Subsequent treatment strategy for mediastinal adenopathy the setting of a personal patient history of bladder cancer and renal cell carcinoma. EXAM: NUCLEAR MEDICINE PET SKULL BASE TO THIGH TECHNIQUE: 10.5 mCi F-18 FDG was injected intravenously. Full-ring PET imaging was performed from the skull base to thigh after the radiotracer. CT data was obtained and used for attenuation correction and anatomic localization. Fasting blood glucose: 114 mg/dl COMPARISON:  12/09/2018 FINDINGS: Mediastinal blood pool activity: SUV max 2.5 Liver activity: SUV max 3.7 NECK: Right level IV lymph node measures 1.0 cm short axis on image 63/3, maximum SUV 2.3 (formerly 3.4). This would correspond to  Deauville 2 activity. Incidental CT findings: Chronic right frontal sinusitis. Minimal chronic left maxillary sinusitis. Bilateral common carotid atherosclerotic calcification. CHEST: Right paratracheal node 1.8 cm in short axis on image 86/3, formerly 2.0 cm, with maximum SUV 3.7 (formerly 3.9). This currently corresponds to Deauville 3 activity. Subcarinal node measuring 1.3 cm in short axis on image 97/3 (formerly 1.7 cm) has a maximum SUV of 3.2 (formerly 3.9). Additional mildly enlarged mediastinal lymph nodes are present with similar low-grade activity. Incidental CT findings: Coronary, aortic arch,  and branch vessel atherosclerotic vascular disease. Moderate cardiomegaly. Pacer device noted. Prosthetic aortic and mitral valves. Stable 5 mm subpleural nodule in the right upper lobe on image 85/3, no accentuated metabolic activity. Mild ground-glass density medially in the left upper lobe on image 93/3, probably incidental. ABDOMEN/PELVIS: Scattered bowel activity is likely physiologic. Incidental CT findings: Nodular contour the liver suggesting cirrhosis. Aortoiliac atherosclerotic vascular disease. Rim calcified left mid kidney cyst with dense calcification measuring 2.0 cm in diameter, but without appreciable hypermetabolic activity. Speckled calcifications along the tunica albuginea of the penis. Non rotated right kidney. SKELETON: Continued accentuated activity along the sternotomy site likely from partial nonunion. Incidental CT findings: none IMPRESSION: 1. Mild reduction in thoracic adenopathy size and activity compared to the prior exam, although these lymph nodes are still mild to moderately enlarged and with Deauville 3 activity. Possibilities might include chronic reactive benign adenopathy or low-grade lymphoproliferative disease. 2. Other imaging findings of potential clinical significance: Mild chronic sinusitis. Aortic Atherosclerosis (ICD10-I70.0). Coronary atherosclerosis. Moderate cardiomegaly. Prosthetic aortic and mitral valves. Stable 5 mm subpleural nodule in the right upper lobe, no change from 2018 hence considered benign. Hepatic cirrhosis. Thick rim calcified left mid kidney cystic lesion without hypermetabolic activity. Possible Peyronie's disease. Partial nonunion of the sternotomy. Electronically Signed   By: Van Clines M.D.   On: 06/09/2019 15:38    ASSESSMENT & PLAN:   Mediastinal lymphadenopathy # Mediastinal LN/right supraclavicular adenopathy 1-2 centimeters lymph node. [Since December 2018].  PET scan-August 2020 compared to imaging February 2020-stable/slightly  decreased size of the mediastinal neck adenopathy; scattered mediastinal adenopathy no significant uptake noted.   # I had a long discussion with the patient Regis Bill that the differential diagnosis includes low-grade lymphoproliferative neoplasm/versus benign reactive.  Given the waxing and waning size of the lymph nodes/persistence-I suspect low-grade lymphoproliferative disorder rather than reactive process.  At this time biopsy is not recommended given the fact the patient is on Coumadin/multiple comorbidities; and even if it is low-grade lymphoma-surveillance rather than treatment is recommended.  #Mild transverse colonic uptake--s/p colonoscopy- s/p polypectomy; Dr.Wohl.   # Cirrhosis-currently compensated follow up with GI  # RCC s/p Right ablation--2020 -PET scan.  # Dizyness/vestibular neuritis-improved/ followed by Dr. Pryor Ochoa ENT  # DISPOSITION: # in  6 months- MD/labs/CT scan prior Dr.B  # I reviewed the blood work- with the patient/wife and daughter in detail; also reviewed the imaging independently [as summarized above]; and with the patient in detail.  He was given a copy of the CT scan.  All questions were answered. The patient knows to call the clinic with any problems, questions or concerns.    Cammie Sickle, MD 06/10/2019 2:24 PM

## 2019-06-10 NOTE — Assessment & Plan Note (Addendum)
#   Mediastinal LN/right supraclavicular adenopathy 1-2 centimeters lymph node. [Since December 2018].  PET scan-August 2020 compared to imaging February 2020-stable/slightly decreased size of the mediastinal neck adenopathy; scattered mediastinal adenopathy no significant uptake noted.   # I had a long discussion with the patient Regis Bill that the differential diagnosis includes low-grade lymphoproliferative neoplasm/versus benign reactive.  Given the waxing and waning size of the lymph nodes/persistence-I suspect low-grade lymphoproliferative disorder rather than reactive process.  At this time biopsy is not recommended given the fact the patient is on Coumadin/multiple comorbidities; and even if it is low-grade lymphoma-surveillance rather than treatment is recommended.  #Mild transverse colonic uptake--s/p colonoscopy- s/p polypectomy; Dr.Wohl.   # Cirrhosis-currently compensated follow up with GI  # RCC s/p Right ablation--2020 -PET scan.  # Dizyness/vestibular neuritis-improved/ followed by Dr. Pryor Ochoa ENT  # DISPOSITION: # in  6 months- MD/labs/CT scan prior Dr.B  # I reviewed the blood work- with the patient/wife and daughter in detail; also reviewed the imaging independently [as summarized above]; and with the patient in detail.  He was given a copy of the CT scan.

## 2019-06-11 ENCOUNTER — Encounter: Payer: Self-pay | Admitting: Cardiovascular Disease

## 2019-06-11 ENCOUNTER — Ambulatory Visit: Payer: PPO | Admitting: Cardiovascular Disease

## 2019-06-11 VITALS — BP 164/60 | HR 80 | Ht 69.0 in | Wt 198.0 lb

## 2019-06-11 DIAGNOSIS — I10 Essential (primary) hypertension: Secondary | ICD-10-CM

## 2019-06-11 DIAGNOSIS — I2510 Atherosclerotic heart disease of native coronary artery without angina pectoris: Secondary | ICD-10-CM

## 2019-06-11 DIAGNOSIS — I482 Chronic atrial fibrillation, unspecified: Secondary | ICD-10-CM | POA: Diagnosis not present

## 2019-06-11 DIAGNOSIS — I421 Obstructive hypertrophic cardiomyopathy: Secondary | ICD-10-CM | POA: Diagnosis not present

## 2019-06-11 MED ORDER — AMLODIPINE BESYLATE 2.5 MG PO TABS: 2.5000 mg | ORAL_TABLET | Freq: Every day | ORAL | 3 refills | Status: DC

## 2019-06-11 NOTE — Patient Instructions (Signed)
Medication Instructions:  Your physician has recommended you make the following change in your medication:  START Amlodipine 2.5mg  daily. An Rx has been sent to your pharmacy  If you need a refill on your cardiac medications before your next appointment, please call your pharmacy.   Lab work: None ordered If you have labs (blood work) drawn today and your tests are completely normal, you will receive your results only by: [name] MyChart Message (if you have MyChart) OR . A paper copy in the mail If you have any lab test that is abnormal or we need to change your treatment, we will call you to review the results.  Testing/Procedures: None ordered  Follow-Up: At [facility], you and your health needs are our priority.  As part of our continuing mission to provide you with exceptional heart care, we have created designated Provider Care Teams.  These Care Teams include your primary Cardiologist (physician) and Advanced Practice Providers (APPs -  Physician Assistants and Nurse Practitioners) who all work together to provide you with the care you need, when you need it. You will need a follow up appointment in 4 months.  You may see Kathlyn Sacramento, MD or one of the following Advanced Practice Providers on your designated Care Team:   Murray Hodgkins, NP Christell Faith, PA-C . Marrianne Mood, PA-C  Any Other Special Instructions Will Be Listed Below (If Applicable). N/A

## 2019-06-11 NOTE — Progress Notes (Signed)
Cardiology Office Note   Date:  06/11/2019   ID:  [name] [name], [name] [name] 07-09-1934, MRN [id number]  PCP:  [name], MD  Cardiologist:   Kathlyn Sacramento, MD   Chief Complaint  Patient presents with  . Other    3 month follow up. Patient c/o SOB. Meds reviewed verbally with patient.       History of Present Illness: [name] [name] is a 85 y.o. male who presents for for a follow-up visit. He has extensive past medical history including chronic atrial fibrillation, aortic stenosis, HOCM, hyperlipidemia, hypertension, type 2 diabetes mellitus, sleep apnea, mitral regurgitation, and hypothyroidism.  He was evaluated at [facility] in mid 2019 in the setting of progressive dyspnea and HOCM.  Cardiac MRI showed significant LVH with left ventricular outflow tract obstruction along with severe aortic stenosis and severe mitral regurgitation.  Right and left heart cardiac catheterization showed nonobstructive CAD and normal cardiac index.  He was subsequently underwent successful septal myomectomy with mechanical aortic valve replacement and mechanical mitral valve replacement in August 2019.  Postoperative course complicated by anemia requiring transfusion as well as pleural effusion requiring thoracentesis.  He also had complete heart block which  required placement of a [employer] single lead permanent pacemaker.  He was discharged to rehabilitation and then in September 2019, he was admitted to [facility] in the setting of GI bleeding with supratherapeutic INR.  EGD showed duodenal ulcer with a visible vessel that was treated with hemo-spray.  Follow-up echocardiogram October 2019 showed normal LV function with appropriately functioning mechanical valves and improved pulmonary artery systolic pressure.  In February, he suffered from vestibular neuritis in the right ear with significant hearing loss and balance problems.  He was seen by ENT and treated with steroids but  reports minimal improvement.  He was also diagnosed with mediastinal lymphadenopathy but he is currently being monitored by oncology.  He has been following up with [facility].  He had a repeated echo done there which showed normal LV systolic function and normal functioning mitral and aortic mechanical valves.  PA pressure was mildly elevated at 39.  No significant change from before.  There was a mention about adding an additional lead to his pacemaker due to shortness of breath but his EF came back normal.  The patient has been doing reasonably well overall.  No chest pain.  He does have exertional dyspnea and fatigue.   Past Medical History:  Diagnosis Date  . Aortic stenosis    a. 01/2016 echo: mod AS; b. 04/2018 Cardiac MRI: sev AS; c. 05/2018 s/p mech AVR @ time of myomectomy; d. 07/2018 Echo: Mild AS/AI.  [name] Atherosclerosis of aorta ([place])    by CT scan in past  . Atrial fibrillation ([place])    and aflutter. pt has a left atrial circuit that is not ablated. was on amiodarone-stopped, now use rate control.   . Bladder cancer ([place])    hx; treated with BCG in past   . Carotid artery disease ([place])    There was calcified plaque but no stenosis by carotid artery screening done at [facility] October, 2013  . CHB (complete heart block) ([place])    a. 05/2018 s/p BSX VVIR PPM ([facility]) in setting of bradycardia following myomectomy and AVR/MVR.  . Diabetes mellitus    type not specified  . Diabetic neuropathy (HCC)    feet  . Elevated liver enzymes    over time; hx  .  Excessive sweating   . Glaucoma   . Gout   . Head injury    when slipped on ice 2004-2005. stabilized and back on Coumadin   . Headache    migraines - distant past  . History of cardiac cath    a. 05/2018 Cath ([facility]): Non-obstructive CAD. Mildly elevated filling pressures w/ nl CI.  [name] HOCM (hypertrophic obstructive cardiomyopathy) ([place])    a. 01/2016 Echo: EF 65-70%, no rwma, LVOT gradient of 80-37mmHg, mod AS, SAM; b.  11/2017 Echo: EF 55-60%, near cavity obliteration in systole, mod AS, mild MS; c. 04/2018 Card MRI ([facility]): Sev LVH, EF >70%, Sev AS, LVOT obs w/ Sev MR, mild to mod TR/PR, mid-myocardial HE in basal-mid anteroseptum and inferoseptum; d. 05/2018 s/p Septal myomectomy; e. 07/2018 Echo: EF 60-65%. PASP 82mmHg.  [name] Homocystinemia ([place])    elevated, mild   . Hypercholesterolemia    treated.   . Hypertension   . Infection of right inner ear   . Kidney mass    a. s/p laproscopic surgery woth cryoablation of a mass outside kidney followed at [facility]; b. 11/2018 CT Chest: 2cm R kidney mass.  . Mediastinal adenopathy    a. 12/208 CT Chest: up to 1mm LLL lung nodule. 1.5-1.7cm subcarinal/mediastinal adenopahty/right paratracheal lymph node; b. 11/2018 CT Chest: 2.5-3cm mediastinal adenopathy.  . Mitral regurgitation    a. 04/2018 Cardiac MRI: sev MR; b. 05/2018 s/p mech MVR @ time of myomectomy; c. 07/2018 Echo: Mild MS.  . Motion sickness    ocean boats  . Nausea   . Nonobstructive coronary artery disease    a. 05/2018 Cath ([facility]): nonobs CAD.  [name] Obstructive sleep apnea    CPAP started successfully 2014  . Orthostatic hypotension    a. orthostatic. dehydration. hospitalized 11/11  . Sleep apnea    Significant obstructive sleep apnea diagnosed in August, 2012, the patient is to receive CPAP   . Stroke [facility])    "2 old strokes" CT and MRI. [place] [facility] 11/11. diagnosis was dehydration, no acutal reports.   . TSH elevation    on synthroid historically   . Unsteady gait    August, 2012    Past Surgical History:  Procedure Laterality Date  . BLADDER SURGERY    . CATARACT EXTRACTION W/PHACO Left 05/11/2015   Procedure: CATARACT EXTRACTION PHACO AND INTRAOCULAR LENS PLACEMENT (IOC);  Surgeon: Leandrew Koyanagi, MD;  Location: [place];  Service: Ophthalmology;  Laterality: Left;  DIABETIC - oral meds, CPAP  . COLONOSCOPY WITH PROPOFOL N/A 04/14/2019   Procedure: COLONOSCOPY WITH PROPOFOL;   Surgeon: [name], MD;  Location: [facility] ENDOSCOPY;  Service: Endoscopy;  Laterality: N/A;  . ESOPHAGOGASTRODUODENOSCOPY (EGD) WITH PROPOFOL N/A 07/01/2018   Procedure: ESOPHAGOGASTRODUODENOSCOPY (EGD) WITH PROPOFOL;  Surgeon: [name], MD;  Location: [facility] ENDOSCOPY;  Service: Endoscopy;  Laterality: N/A;  . INSERT / REPLACE / REMOVE PACEMAKER     Chemical engineer   . KIDNEY SURGERY     "froze mass"  . MECHANICAL AORTIC AND MITRAL VALVE REPLACEMENT  05/2018   [facility]   . TONSILLECTOMY    . VALVE REPLACEMENT       Current Outpatient Medications  Medication Sig Dispense Refill  . ALPRAZolam (XANAX) 0.25 MG tablet Take 1 tablet (0.25 mg total) by mouth every 6 (six) hours as needed for anxiety. 60 tablet 0  . aspirin EC 81 MG tablet Take 1 tablet (81 mg total) by mouth daily.    [name] glucose blood (ONE TOUCH ULTRA  TEST) test strip USE ONE STRIP TO CHECK GLUCOSE ONCE DAILY 100 each 12  . latanoprost (XALATAN) 0.005 % ophthalmic solution Place 1 drop into both eyes at bedtime.     [name] levothyroxine (SYNTHROID, LEVOTHROID) 50 MCG tablet TAKE 1 TABLET BY MOUTH ONCE DAILY 90 tablet 3  . metFORMIN (GLUCOPHAGE) 1000 MG tablet Take 1 tablet (1,000 mg total) by mouth daily with breakfast. 90 tablet 3  . metoprolol tartrate (LOPRESSOR) 25 MG tablet Take 0.5 tablets (12.5 mg total) by mouth 2 (two) times daily. 30 tablet 1  . ONETOUCH DELICA LANCETS 99991111 MISC USE ONE LANCET TO CHECK BLOOD GLUCOSE ONCE DAILY 100 each 12  . ONETOUCH DELICA LANCETS 99991111 MISC USE TO TEST BLOOD SUGAR ONCE DAILY 100 each 12  . ONETOUCH ULTRA test strip USE 1 STRIP TO CHECK GLUCOSE ONCE DAILY 50 each 11  . pantoprazole (PROTONIX) 40 MG tablet Take 1 tablet (40 mg total) by mouth 2 (two) times daily. (Patient taking differently: Take 40 mg by mouth daily. ) 60 tablet 3  . simvastatin (ZOCOR) 40 MG tablet TAKE 1/2 (ONE-HALF) TABLET BY MOUTH AT BEDTIME 45 tablet 3  . warfarin (COUMADIN) 3 MG tablet TAKE  AS DIRECTED BY COUMADIN  CLINIC 45 tablet 1  . amLODipine (NORVASC) 2.5 MG tablet Take 1 tablet (2.5 mg total) by mouth daily. 90 tablet 3   No current facility-administered medications for this visit.     Allergies:   Macrolides and ketolides, Mycinettes, Nitrofuran derivatives, Nitrofurantoin, Erythromycin, Zofran [ondansetron hcl-dextrose], and Zofran [ondansetron hcl]    Social History:  The patient  reports that he quit smoking about 44 years ago. His smoking use included cigars and cigarettes. He has never used smokeless tobacco. He reports that he does not drink alcohol or use drugs.   Family History:  The patient's family history includes Arrhythmia in his brother and father; Breast cancer in his mother; Prostate cancer in his father; Stroke in his father.    ROS:  Please see the history of present illness.   Otherwise, review of systems are positive for none.   All other systems are reviewed and negative.    PHYSICAL EXAM: VS:  BP (!) 164/60 (BP Location: Left Arm, Patient Position: Sitting, Cuff Size: Normal)   Pulse 80   Ht 5\' 9"  (1.753 m)   Wt 198 lb (89.8 kg)   BMI 29.24 kg/m  , BMI Body mass index is 29.24 kg/m. GEN: Well nourished, well developed, in no acute distress  HEENT: normal  Neck: no JVD, carotid bruits, or masses Cardiac: Regular rate and rhythm; no rubs, or gallops,no edema .  Normal mechanical heart sound. Respiratory:  clear to auscultation bilaterally, normal work of breathing GI: soft, nontender, nondistended, + BS MS: no deformity or atrophy  Skin: warm and dry, no rash Neuro:  Strength and sensation are intact Psych: euthymic mood, full affect   EKG:  EKG is ordered today. The ekg ordered today demonstrates ventricular paced rhythm with PVCs and underlying atrial fibrillation.   Recent Labs: 07/10/2018: TSH 2.960 12/01/2018: ALT 13; B Natriuretic Peptide 340.0; BUN 20; Creatinine, Ser 1.06; Magnesium 2.1; Potassium 3.7; Sodium 137 12/02/2018: Hemoglobin 12.5; Platelets  228    Lipid Panel    Component Value Date/Time   CHOL 147 11/12/2017 0832   TRIG 120 11/12/2017 0832   HDL 41 11/12/2017 0832   CHOLHDL 4.1 12/10/2016 0953   LDLCALC 82 11/12/2017 0832      Wt Readings from Last  3 Encounters:  06/11/19 198 lb (89.8 kg)  06/10/19 202 lb (91.6 kg)  04/14/19 190 lb (86.2 kg)        ASSESSMENT AND PLAN:   1.  Hypertrophic obstructive cardiomyopathy: Status post septal myomectomy at [facility] in the summer 2019 with the addition of mechanical mitral and aortic valve replacements.  Recent echocardiogram was reassuring.   2.  Valvular heart disease: Status post mechanical mitral and aortic valve replacements.  He is chronically anticoagulated with Coumadin and followed closely in our Coumadin clinic.  Normal functioning valves on echo in October 2019.  Continue low-dose aspirin as well.  3.  Complete heart block: This developed following septal myomectomy.  He is status post single lead Product/process development scientist and this is followed at [facility].  4.  Permanent atrial fibrillation: Rate controlled on beta-blocker therapy.  Anticoagulated with Coumadin.  5.  Essential hypertension: Blood pressure continues to be elevated.  I elected to add small dose amlodipine 2.5 mg once daily.  5.  Nonobstructive CAD: Minimal CAD on catheterization in August 2019.  Continue aspirin, beta-blocker, and statin therapy.  7.  Hyperlipidemia: He remains on statin therapy.    Disposition:   FU with me in 4 months  Signed,  Kathlyn Sacramento, MD  06/11/2019 4:33 PM    [place]

## 2019-06-12 ENCOUNTER — Other Ambulatory Visit: Payer: Self-pay

## 2019-06-12 ENCOUNTER — Ambulatory Visit (INDEPENDENT_AMBULATORY_CARE_PROVIDER_SITE_OTHER): Payer: PPO | Admitting: Family Medicine

## 2019-06-12 DIAGNOSIS — Z23 Encounter for immunization: Secondary | ICD-10-CM

## 2019-06-16 ENCOUNTER — Ambulatory Visit (INDEPENDENT_AMBULATORY_CARE_PROVIDER_SITE_OTHER): Payer: PPO | Admitting: Family Medicine

## 2019-06-16 ENCOUNTER — Other Ambulatory Visit: Payer: Self-pay

## 2019-06-16 VITALS — BP 128/72 | HR 74 | Temp 98.1°F | Resp 16 | Wt 200.0 lb

## 2019-06-16 DIAGNOSIS — I421 Obstructive hypertrophic cardiomyopathy: Secondary | ICD-10-CM | POA: Diagnosis not present

## 2019-06-16 DIAGNOSIS — I482 Chronic atrial fibrillation, unspecified: Secondary | ICD-10-CM | POA: Diagnosis not present

## 2019-06-16 DIAGNOSIS — H9313 Tinnitus, bilateral: Secondary | ICD-10-CM | POA: Diagnosis not present

## 2019-06-16 DIAGNOSIS — D471 Chronic myeloproliferative disease: Secondary | ICD-10-CM | POA: Diagnosis not present

## 2019-06-16 DIAGNOSIS — R42 Dizziness and giddiness: Secondary | ICD-10-CM

## 2019-06-16 DIAGNOSIS — Z950 Presence of cardiac pacemaker: Secondary | ICD-10-CM

## 2019-06-16 DIAGNOSIS — R590 Localized enlarged lymph nodes: Secondary | ICD-10-CM

## 2019-06-16 DIAGNOSIS — R2681 Unsteadiness on feet: Secondary | ICD-10-CM | POA: Diagnosis not present

## 2019-06-16 DIAGNOSIS — E1142 Type 2 diabetes mellitus with diabetic polyneuropathy: Secondary | ICD-10-CM | POA: Diagnosis not present

## 2019-06-16 MED ORDER — MECLIZINE HCL 12.5 MG PO TABS: 12.5000 mg | ORAL_TABLET | Freq: Three times a day (TID) | ORAL | 3 refills | Status: DC | PRN

## 2019-06-16 NOTE — Progress Notes (Signed)
Patient: [name] [name] Male    DOB: [birth year]   85 y.o.   MRN: [id number] Visit Date: 06/16/2019  Today's Provider: [name], MD   Chief Complaint  Patient presents with  . Follow-up   Subjective:   HPI Patient comes in today for a follow up. He was last seen in the office 3 months ago. Since last visit, he has seen the cardiologist and he was advised to start amlodipine 2.5mg  once daily. He is tolerating the medication well.   He also mentions that he has had ongoing ear pain for the last several weeks. He occasionally has vertigo due to this. He reports that he was treated for this the last time he was here. He does not feel this has gotten completley better.  He has occasional mild vertigo with quick movements.  Overall he is feeling pretty well. BP Readings from Last 3 Encounters:  06/16/19 128/72  06/11/19 (!) 164/60  06/10/19 (!) 155/75   Wt Readings from Last 3 Encounters:  06/16/19 200 lb (90.7 kg)  06/11/19 198 lb (89.8 kg)  06/10/19 202 lb (91.6 kg)    Allergies  Allergen Reactions  . Macrolides And Ketolides Other (See Comments)  . Mycinettes   . Nitrofuran Derivatives Other (See Comments)  . Nitrofurantoin Other (See Comments)  . Erythromycin Itching and Rash    Other reaction(s): UNKNOWN  And red skin  . Zofran [Ondansetron Hcl-Dextrose] Rash  . Zofran [Ondansetron Hcl] Rash     Current Outpatient Medications:  .  ALPRAZolam (XANAX) 0.25 MG tablet, Take 1 tablet (0.25 mg total) by mouth every 6 (six) hours as needed for anxiety., Disp: 60 tablet, Rfl: 0 .  amLODipine (NORVASC) 2.5 MG tablet, Take 1 tablet (2.5 mg total) by mouth daily., Disp: 90 tablet, Rfl: 3 .  aspirin EC 81 MG tablet, Take 1 tablet (81 mg total) by mouth daily., Disp: , Rfl:  .  glucose blood (ONE TOUCH ULTRA TEST) test strip, USE ONE STRIP TO CHECK GLUCOSE ONCE DAILY, Disp: 100 each, Rfl: 12 .  latanoprost (XALATAN) 0.005 % ophthalmic solution, Place 1 drop into  both eyes at bedtime. , Disp: , Rfl:  .  levothyroxine (SYNTHROID, LEVOTHROID) 50 MCG tablet, TAKE 1 TABLET BY MOUTH ONCE DAILY, Disp: 90 tablet, Rfl: 3 .  metFORMIN (GLUCOPHAGE) 1000 MG tablet, Take 1 tablet (1,000 mg total) by mouth daily with breakfast., Disp: 90 tablet, Rfl: 3 .  metoprolol tartrate (LOPRESSOR) 25 MG tablet, Take 0.5 tablets (12.5 mg total) by mouth 2 (two) times daily., Disp: 30 tablet, Rfl: 1 .  ONETOUCH DELICA LANCETS 99991111 MISC, USE ONE LANCET TO CHECK BLOOD GLUCOSE ONCE DAILY, Disp: 100 each, Rfl: 12 .  ONETOUCH DELICA LANCETS 99991111 MISC, USE TO TEST BLOOD SUGAR ONCE DAILY, Disp: 100 each, Rfl: 12 .  ONETOUCH ULTRA test strip, USE 1 STRIP TO CHECK GLUCOSE ONCE DAILY, Disp: 50 each, Rfl: 11 .  pantoprazole (PROTONIX) 40 MG tablet, Take 1 tablet (40 mg total) by mouth 2 (two) times daily. (Patient taking differently: Take 40 mg by mouth daily. ), Disp: 60 tablet, Rfl: 3 .  simvastatin (ZOCOR) 40 MG tablet, TAKE 1/2 (ONE-HALF) TABLET BY MOUTH AT BEDTIME, Disp: 45 tablet, Rfl: 3 .  warfarin (COUMADIN) 3 MG tablet, TAKE  AS DIRECTED BY COUMADIN CLINIC, Disp: 45 tablet, Rfl: 1  Review of Systems  Constitutional: Negative for activity change and fatigue.  HENT: Positive for ear pain.  Eyes: Negative.   Respiratory: Negative for cough and shortness of breath.   Cardiovascular: Negative for chest pain, palpitations and leg swelling.  Gastrointestinal: Negative.   Musculoskeletal: Positive for arthralgias.  Allergic/Immunologic: Negative.   Neurological: Negative for dizziness, weakness, light-headedness and headaches.  Psychiatric/Behavioral: Negative for agitation, self-injury, sleep disturbance and suicidal ideas. The patient is not nervous/anxious.     Social History   Tobacco Use  . Smoking status: Former Smoker    Types: Cigars, Cigarettes    Quit date: 10/02/1974    Years since quitting: 44.7  . Smokeless tobacco: Never Used  . Tobacco comment: still smokes cigars  once a day  Substance Use Topics  . Alcohol use: No    Frequency: Never      Objective:   BP 128/72   Pulse 74   Temp 98.1 F (36.7 C)   Resp 16   Wt 200 lb (90.7 kg)   SpO2 96%   BMI 29.53 kg/m  Vitals:   06/16/19 1605  BP: 128/72  Pulse: 74  Resp: 16  Temp: 98.1 F (36.7 C)  SpO2: 96%  Weight: 200 lb (90.7 kg)  Body mass index is 29.53 kg/m.   Physical Exam Vitals signs reviewed.  Constitutional:      Appearance: He is well-developed.  HENT:     Head: Normocephalic and atraumatic.     Right Ear: External ear normal.     Left Ear: External ear normal.     Nose: Nose normal.  Eyes:     General: No scleral icterus.    Conjunctiva/sclera: Conjunctivae normal.  Neck:     Thyroid: No thyromegaly.     Vascular: No JVD.  Cardiovascular:     Rate and Rhythm: Normal rate and regular rhythm.     Heart sounds: Normal heart sounds.  Pulmonary:     Effort: Pulmonary effort is normal.     Breath sounds: Normal breath sounds.  Abdominal:     Palpations: Abdomen is soft.  Musculoskeletal:        General: No swelling.     Right lower leg: No edema.     Left lower leg: No edema.  Skin:    General: Skin is warm and dry.  Neurological:     General: No focal deficit present.     Mental Status: He is alert and oriented to person, place, and time.  Psychiatric:        Mood and Affect: Mood normal.        Behavior: Behavior normal.        Thought Content: Thought content normal.        Judgment: Judgment normal.      No results found for any visits on 06/16/19.     Assessment & Plan    1. Chronic atrial fibrillation coumadin  2. Cardiomyopathy, hypertrophic obstructive ([place]) Has a new [facility] appt after myomectomy at [facility].  3. Unsteady gait Multifactorial. More than 50% 28 minute visit spent in counseling or coordination of care   4. Vertigo  - meclizine (ANTIVERT) 12.5 MG tablet; Take 1 tablet (12.5 mg total) by mouth 3 (three) times daily as needed for  dizziness.  Dispense: 90 tablet; Refill: 3  5. Mediastinal lymphadenopathy   6. HOCM (hypertrophic obstructive cardiomyopathy) ([place]) [facility] appt.  7. Type 2 diabetes mellitus with diabetic polyneuropathy, without long-term current use of insulin ([place])   8. Pacemaker   9. Tinnitus of both ears   10. Myeloproliferative disorder ([place]) Per  Dr Marthenia Rolling, MD  [facility]

## 2019-06-23 ENCOUNTER — Other Ambulatory Visit: Payer: Self-pay | Admitting: Cardiovascular Disease

## 2019-06-24 ENCOUNTER — Ambulatory Visit: Payer: PPO

## 2019-06-24 ENCOUNTER — Other Ambulatory Visit: Payer: Self-pay

## 2019-06-24 DIAGNOSIS — Z5181 Encounter for therapeutic drug level monitoring: Secondary | ICD-10-CM

## 2019-06-24 DIAGNOSIS — Z952 Presence of prosthetic heart valve: Secondary | ICD-10-CM | POA: Diagnosis not present

## 2019-06-24 DIAGNOSIS — I482 Chronic atrial fibrillation, unspecified: Secondary | ICD-10-CM

## 2019-06-24 DIAGNOSIS — I4892 Unspecified atrial flutter: Secondary | ICD-10-CM | POA: Diagnosis not present

## 2019-06-24 DIAGNOSIS — I4891 Unspecified atrial fibrillation: Secondary | ICD-10-CM

## 2019-06-24 DIAGNOSIS — Z950 Presence of cardiac pacemaker: Secondary | ICD-10-CM

## 2019-06-24 LAB — POCT INR: INR: 4 — AB (ref 2.0–3.0)

## 2019-06-24 NOTE — Patient Instructions (Signed)
Please skip warfarin tonight, then resume dosage of 1.5 tablets every day EXCEPT 2 TABLETS ON FRIDAYS. Recheck INR in 4 weeks - after you get back in town.

## 2019-07-06 ENCOUNTER — Ambulatory Visit: Payer: Self-pay | Admitting: Family Medicine

## 2019-07-11 DIAGNOSIS — G4733 Obstructive sleep apnea (adult) (pediatric): Secondary | ICD-10-CM | POA: Diagnosis not present

## 2019-07-20 DIAGNOSIS — Z954 Presence of other heart-valve replacement: Secondary | ICD-10-CM | POA: Diagnosis not present

## 2019-07-20 DIAGNOSIS — I4821 Permanent atrial fibrillation: Secondary | ICD-10-CM | POA: Diagnosis not present

## 2019-07-20 DIAGNOSIS — Q248 Other specified congenital malformations of heart: Secondary | ICD-10-CM | POA: Diagnosis not present

## 2019-07-20 DIAGNOSIS — I421 Obstructive hypertrophic cardiomyopathy: Secondary | ICD-10-CM | POA: Diagnosis not present

## 2019-07-20 DIAGNOSIS — Z952 Presence of prosthetic heart valve: Secondary | ICD-10-CM | POA: Diagnosis not present

## 2019-07-22 ENCOUNTER — Other Ambulatory Visit: Payer: Self-pay

## 2019-07-22 ENCOUNTER — Ambulatory Visit: Payer: PPO

## 2019-07-22 DIAGNOSIS — I482 Chronic atrial fibrillation, unspecified: Secondary | ICD-10-CM | POA: Diagnosis not present

## 2019-07-22 DIAGNOSIS — Z952 Presence of prosthetic heart valve: Secondary | ICD-10-CM

## 2019-07-22 DIAGNOSIS — I4892 Unspecified atrial flutter: Secondary | ICD-10-CM | POA: Diagnosis not present

## 2019-07-22 DIAGNOSIS — Z5181 Encounter for therapeutic drug level monitoring: Secondary | ICD-10-CM | POA: Diagnosis not present

## 2019-07-22 DIAGNOSIS — Z950 Presence of cardiac pacemaker: Secondary | ICD-10-CM | POA: Diagnosis not present

## 2019-07-22 DIAGNOSIS — I4891 Unspecified atrial fibrillation: Secondary | ICD-10-CM | POA: Diagnosis not present

## 2019-07-22 LAB — POCT INR: INR: 4.4 — AB (ref 2.0–3.0)

## 2019-07-22 NOTE — Patient Instructions (Signed)
Please skip warfarin tonight, then START NEW DOSAGE of 1.5 tablets every day. Recheck INR in 2 weeks.

## 2019-07-23 ENCOUNTER — Other Ambulatory Visit: Payer: Self-pay | Admitting: Cardiovascular Disease

## 2019-07-27 DIAGNOSIS — B351 Tinea unguium: Secondary | ICD-10-CM | POA: Diagnosis not present

## 2019-07-27 DIAGNOSIS — E119 Type 2 diabetes mellitus without complications: Secondary | ICD-10-CM | POA: Diagnosis not present

## 2019-07-28 ENCOUNTER — Other Ambulatory Visit: Payer: Self-pay | Admitting: Family Medicine

## 2019-07-29 ENCOUNTER — Other Ambulatory Visit: Payer: Self-pay

## 2019-07-29 ENCOUNTER — Encounter: Payer: Self-pay | Admitting: Family Medicine

## 2019-07-29 ENCOUNTER — Ambulatory Visit (INDEPENDENT_AMBULATORY_CARE_PROVIDER_SITE_OTHER): Payer: PPO | Admitting: Family Medicine

## 2019-07-29 VITALS — BP 126/74 | Temp 97.3°F | Resp 16 | Wt 204.6 lb

## 2019-07-29 DIAGNOSIS — E119 Type 2 diabetes mellitus without complications: Secondary | ICD-10-CM | POA: Diagnosis not present

## 2019-07-29 DIAGNOSIS — I421 Obstructive hypertrophic cardiomyopathy: Secondary | ICD-10-CM

## 2019-07-29 DIAGNOSIS — I10 Essential (primary) hypertension: Secondary | ICD-10-CM

## 2019-07-29 DIAGNOSIS — R2681 Unsteadiness on feet: Secondary | ICD-10-CM

## 2019-07-29 DIAGNOSIS — D090 Carcinoma in situ of bladder: Secondary | ICD-10-CM

## 2019-07-29 DIAGNOSIS — E785 Hyperlipidemia, unspecified: Secondary | ICD-10-CM

## 2019-07-29 DIAGNOSIS — I2510 Atherosclerotic heart disease of native coronary artery without angina pectoris: Secondary | ICD-10-CM

## 2019-07-29 DIAGNOSIS — E039 Hypothyroidism, unspecified: Secondary | ICD-10-CM

## 2019-07-29 DIAGNOSIS — I482 Chronic atrial fibrillation, unspecified: Secondary | ICD-10-CM | POA: Diagnosis not present

## 2019-07-29 DIAGNOSIS — R590 Localized enlarged lymph nodes: Secondary | ICD-10-CM | POA: Diagnosis not present

## 2019-07-29 DIAGNOSIS — C641 Malignant neoplasm of right kidney, except renal pelvis: Secondary | ICD-10-CM

## 2019-07-29 DIAGNOSIS — E1142 Type 2 diabetes mellitus with diabetic polyneuropathy: Secondary | ICD-10-CM | POA: Diagnosis not present

## 2019-07-29 DIAGNOSIS — Z950 Presence of cardiac pacemaker: Secondary | ICD-10-CM

## 2019-07-29 NOTE — Progress Notes (Signed)
Patient: [name] [name] Male    DOB: 1934-02-16   85 y.o.   MRN: [id number] Visit Date: 07/29/2019  Today's Provider: [name], MD   Chief Complaint  Patient presents with  . Follow-up   Subjective:     HPI 1 month follow up.   Allergies  Allergen Reactions  . Macrolides And Ketolides Other (See Comments)  . Mycinettes   . Nitrofuran Derivatives Other (See Comments)  . Nitrofurantoin Other (See Comments)  . Erythromycin Itching and Rash    Other reaction(s): UNKNOWN  And red skin  . Zofran [Ondansetron Hcl-Dextrose] Rash  . Zofran [Ondansetron Hcl] Rash     Current Outpatient Medications:  .  ALPRAZolam (XANAX) 0.25 MG tablet, Take 1 tablet (0.25 mg total) by mouth every 6 (six) hours as needed for anxiety., Disp: 60 tablet, Rfl: 0 .  amLODipine (NORVASC) 2.5 MG tablet, Take 1 tablet (2.5 mg total) by mouth daily., Disp: 90 tablet, Rfl: 3 .  aspirin EC 81 MG tablet, Take 1 tablet (81 mg total) by mouth daily., Disp: , Rfl:  .  glucose blood (ONE TOUCH ULTRA TEST) test strip, USE ONE STRIP TO CHECK GLUCOSE ONCE DAILY, Disp: 100 each, Rfl: 12 .  Lancets (ONETOUCH DELICA PLUS 123XX123) MISC, USE 1  TO CHECK GLUCOSE ONCE DAILY, Disp: 100 each, Rfl: 11 .  latanoprost (XALATAN) 0.005 % ophthalmic solution, Place 1 drop into both eyes at bedtime. , Disp: , Rfl:  .  levothyroxine (SYNTHROID, LEVOTHROID) 50 MCG tablet, TAKE 1 TABLET BY MOUTH ONCE DAILY, Disp: 90 tablet, Rfl: 3 .  meclizine (ANTIVERT) 12.5 MG tablet, Take 1 tablet (12.5 mg total) by mouth 3 (three) times daily as needed for dizziness., Disp: 90 tablet, Rfl: 3 .  metFORMIN (GLUCOPHAGE) 1000 MG tablet, Take 1 tablet (1,000 mg total) by mouth daily with breakfast., Disp: 90 tablet, Rfl: 3 .  metoprolol tartrate (LOPRESSOR) 25 MG tablet, Take 1/2 (one-half) tablet by mouth twice daily, Disp: 30 tablet, Rfl: 2 .  ONETOUCH DELICA LANCETS 99991111 MISC, USE ONE LANCET TO CHECK BLOOD GLUCOSE ONCE DAILY, Disp:  100 each, Rfl: 12 .  ONETOUCH ULTRA test strip, USE 1 STRIP TO CHECK GLUCOSE ONCE DAILY, Disp: 50 each, Rfl: 11 .  pantoprazole (PROTONIX) 40 MG tablet, Take 1 tablet (40 mg total) by mouth 2 (two) times daily. (Patient taking differently: Take 40 mg by mouth daily. ), Disp: 60 tablet, Rfl: 3 .  simvastatin (ZOCOR) 40 MG tablet, TAKE 1/2 (ONE-HALF) TABLET BY MOUTH AT BEDTIME, Disp: 45 tablet, Rfl: 0 .  warfarin (COUMADIN) 3 MG tablet, TAKE  AS DIRECTED BY COUMADIN CLINIC, Disp: 45 tablet, Rfl: 1  Review of Systems  Constitutional: Negative for activity change and fatigue.  HENT: Negative.   Eyes: Negative.   Respiratory: Negative for cough and shortness of breath.   Cardiovascular: Negative for chest pain, palpitations and leg swelling.  Gastrointestinal: Negative.   Endocrine: Negative.   Musculoskeletal: Positive for arthralgias.  Allergic/Immunologic: Negative.   Neurological: Positive for weakness. Negative for dizziness, light-headedness and headaches.  Psychiatric/Behavioral: Negative for agitation, self-injury, sleep disturbance and suicidal ideas. The patient is not nervous/anxious.     Social History   Tobacco Use  . Smoking status: Former Smoker    Types: Cigars, Cigarettes    Quit date: 10/02/1974    Years since quitting: 44.8  . Smokeless tobacco: Never Used  . Tobacco comment: still smokes cigars once a day  Substance  Use Topics  . Alcohol use: No    Frequency: Never      Objective:   BP 126/74 (BP Location: Left Arm, Patient Position: Sitting, Cuff Size: Large)   Temp (!) 97.3 F (36.3 C) (Temporal)   Resp 16   Wt 204 lb 9.6 oz (92.8 kg)   SpO2 97%   BMI 30.21 kg/m  Vitals:   07/29/19 1437  BP: 126/74  Resp: 16  Temp: (!) 97.3 F (36.3 C)  TempSrc: Temporal  SpO2: 97%  Weight: 204 lb 9.6 oz (92.8 kg)  Body mass index is 30.21 kg/m.   Physical Exam Vitals signs reviewed.  Constitutional:      Appearance: He is well-developed.  HENT:     Head:  Normocephalic and atraumatic.     Right Ear: External ear normal.     Left Ear: External ear normal.     Nose: Nose normal.  Eyes:     General: No scleral icterus.    Conjunctiva/sclera: Conjunctivae normal.  Neck:     Thyroid: No thyromegaly.     Vascular: No JVD.  Cardiovascular:     Rate and Rhythm: Normal rate and regular rhythm.     Heart sounds: Normal heart sounds.  Pulmonary:     Effort: Pulmonary effort is normal.     Breath sounds: Normal breath sounds.  Abdominal:     Palpations: Abdomen is soft.  Musculoskeletal:        General: No swelling.     Right lower leg: No edema.     Left lower leg: No edema.  Skin:    General: Skin is warm and dry.  Neurological:     General: No focal deficit present.     Mental Status: He is alert and oriented to person, place, and time. Mental status is at baseline.  Psychiatric:        Mood and Affect: Mood normal.        Behavior: Behavior normal.        Thought Content: Thought content normal.        Judgment: Judgment normal.      No results found for any visits on 07/29/19.     Assessment & Plan    1. Type 2 diabetes mellitus without complication, without long-term current use of insulin (HCC) Goal A1c less than 8. - Comp. Metabolic Panel (12) - POCT HgB A1C  2. Adult hypothyroidism  - TSH  3. Hyperlipidemia, unspecified hyperlipidemia type Goal LDL less than 70 - Lipid Profile  4. Hypertension, unspecified type Controlled. - CBC w/Diff/Platelet - Lipid Profile  5. Coronary artery disease without angina pectoris, unspecified vessel or lesion type, unspecified whether native or transplanted heart All risk factors treated.  6. Mediastinal lymphadenopathy Followed by oncology.  This appears to be benign at this time.  7. HOCM (hypertrophic obstructive cardiomyopathy) ([place]) Status post myomectomy.  This was done at [facility]  8. Chronic atrial fibrillation (HCC)   9. Diabetic polyneuropathy associated with type  2 diabetes mellitus ([place])   10. Carcinoma in situ of bladder   11. Cancer of right kidney ([place])   12. Unsteady gait Advised patient to continue to walk on flat ground and remain as active as possible. More than 50% 25 minute visit spent in counseling or coordination of care  13. Pacemaker      [name], MD  [facility]

## 2019-07-30 DIAGNOSIS — E785 Hyperlipidemia, unspecified: Secondary | ICD-10-CM | POA: Diagnosis not present

## 2019-07-30 DIAGNOSIS — I10 Essential (primary) hypertension: Secondary | ICD-10-CM | POA: Diagnosis not present

## 2019-07-30 DIAGNOSIS — E119 Type 2 diabetes mellitus without complications: Secondary | ICD-10-CM | POA: Diagnosis not present

## 2019-07-30 DIAGNOSIS — E039 Hypothyroidism, unspecified: Secondary | ICD-10-CM | POA: Diagnosis not present

## 2019-07-31 LAB — CBC WITH DIFFERENTIAL/PLATELET
Basophils Absolute: 0.1 10*3/uL (ref 0.0–0.2)
Basos: 1 %
EOS (ABSOLUTE): 0.3 10*3/uL (ref 0.0–0.4)
Eos: 5 %
Hematocrit: 35.9 % — ABNORMAL LOW (ref 37.5–51.0)
Hemoglobin: 12 g/dL — ABNORMAL LOW (ref 13.0–17.7)
Immature Grans (Abs): 0 10*3/uL (ref 0.0–0.1)
Immature Granulocytes: 1 %
Lymphocytes Absolute: 1.7 10*3/uL (ref 0.7–3.1)
Lymphs: 26 %
MCH: 30.5 pg (ref 26.6–33.0)
MCHC: 33.4 g/dL (ref 31.5–35.7)
MCV: 91 fL (ref 79–97)
Monocytes Absolute: 0.7 10*3/uL (ref 0.1–0.9)
Monocytes: 11 %
Neutrophils Absolute: 3.8 10*3/uL (ref 1.4–7.0)
Neutrophils: 56 %
Platelets: 199 10*3/uL (ref 150–450)
RBC: 3.94 x10E6/uL — ABNORMAL LOW (ref 4.14–5.80)
RDW: 14 % (ref 11.6–15.4)
WBC: 6.7 10*3/uL (ref 3.4–10.8)

## 2019-07-31 LAB — LIPID PANEL
Chol/HDL Ratio: 3.6 ratio (ref 0.0–5.0)
Cholesterol, Total: 142 mg/dL (ref 100–199)
HDL: 39 mg/dL — ABNORMAL LOW (ref >39)
LDL Chol Calc (NIH): 85 mg/dL (ref 0–99)
Triglycerides: 97 mg/dL (ref 0–149)
VLDL Cholesterol Cal: 18 mg/dL (ref 5–40)

## 2019-07-31 LAB — COMP. METABOLIC PANEL (12)
AST: 34 IU/L (ref 0–40)
Albumin/Globulin Ratio: 1.4 (ref 1.2–2.2)
Albumin: 4.2 g/dL (ref 3.6–4.6)
Alkaline Phosphatase: 72 IU/L (ref 39–117)
BUN/Creatinine Ratio: 16 (ref 10–24)
BUN: 17 mg/dL (ref 8–27)
Bilirubin Total: 0.7 mg/dL (ref 0.0–1.2)
Calcium: 9.3 mg/dL (ref 8.6–10.2)
Chloride: 97 mmol/L (ref 96–106)
Creatinine, Ser: 1.05 mg/dL (ref 0.76–1.27)
GFR calc Af Amer: 76 mL/min/{1.73_m2} (ref >59)
GFR calc non Af Amer: 65 mL/min/{1.73_m2} (ref >59)
Globulin, Total: 3 g/dL (ref 1.5–4.5)
Glucose: 124 mg/dL — ABNORMAL HIGH (ref 65–99)
Potassium: 4.9 mmol/L (ref 3.5–5.2)
Sodium: 139 mmol/L (ref 134–144)
Total Protein: 7.2 g/dL (ref 6.0–8.5)

## 2019-07-31 LAB — TSH: TSH: 3.3 u[IU]/mL (ref 0.450–4.500)

## 2019-08-03 ENCOUNTER — Ambulatory Visit (INDEPENDENT_AMBULATORY_CARE_PROVIDER_SITE_OTHER): Payer: PPO

## 2019-08-03 ENCOUNTER — Other Ambulatory Visit: Payer: Self-pay

## 2019-08-03 DIAGNOSIS — I482 Chronic atrial fibrillation, unspecified: Secondary | ICD-10-CM

## 2019-08-03 DIAGNOSIS — Z5181 Encounter for therapeutic drug level monitoring: Secondary | ICD-10-CM | POA: Diagnosis not present

## 2019-08-03 DIAGNOSIS — I4892 Unspecified atrial flutter: Secondary | ICD-10-CM | POA: Diagnosis not present

## 2019-08-03 DIAGNOSIS — Z950 Presence of cardiac pacemaker: Secondary | ICD-10-CM | POA: Diagnosis not present

## 2019-08-03 DIAGNOSIS — Z952 Presence of prosthetic heart valve: Secondary | ICD-10-CM | POA: Diagnosis not present

## 2019-08-03 DIAGNOSIS — I4891 Unspecified atrial fibrillation: Secondary | ICD-10-CM

## 2019-08-03 LAB — POCT INR: INR: 3 (ref 2.0–3.0)

## 2019-08-03 NOTE — Patient Instructions (Signed)
Please continue dosage of 1.5 tablets every day. Recheck INR in 3 weeks after you get back from the beach.

## 2019-08-10 DIAGNOSIS — G4733 Obstructive sleep apnea (adult) (pediatric): Secondary | ICD-10-CM | POA: Diagnosis not present

## 2019-08-10 LAB — HGB A1C W/O EAG: Hgb A1c MFr Bld: 6.7 % — ABNORMAL HIGH (ref 4.8–5.6)

## 2019-08-10 LAB — SPECIMEN STATUS REPORT

## 2019-08-17 ENCOUNTER — Other Ambulatory Visit: Payer: Self-pay | Admitting: Cardiovascular Disease

## 2019-08-17 NOTE — Telephone Encounter (Signed)
Refill Request.  

## 2019-08-24 ENCOUNTER — Telehealth: Payer: Self-pay | Admitting: Family Medicine

## 2019-08-24 DIAGNOSIS — E039 Hypothyroidism, unspecified: Secondary | ICD-10-CM

## 2019-08-24 MED ORDER — LEVOTHYROXINE SODIUM 50 MCG PO TABS: 50.0000 ug | ORAL_TABLET | Freq: Every day | ORAL | 3 refills | Status: DC

## 2019-08-24 NOTE — Telephone Encounter (Signed)
Medication was sent into the pharmacy.  

## 2019-08-24 NOTE — Telephone Encounter (Signed)
Pt calling to let Dr. Rosanna Randy know he has been out of levothyroxine (SYNTHROID, LEVOTHROID) 50 MCG tablet for 4 days.  Pt needing it approved by Dr. Rosanna Randy to get his mediation filled at:  [facility] [address], [place] - [place] [phone] (Phone) [phone] (Fax)   Thanks, [employer]

## 2019-08-26 ENCOUNTER — Ambulatory Visit: Payer: PPO

## 2019-08-26 ENCOUNTER — Encounter: Payer: Self-pay | Admitting: Urology

## 2019-08-26 ENCOUNTER — Other Ambulatory Visit: Payer: Self-pay

## 2019-08-26 ENCOUNTER — Ambulatory Visit: Payer: PPO | Admitting: Urology

## 2019-08-26 VITALS — BP 134/81 | HR 105 | Ht 67.0 in | Wt 204.0 lb

## 2019-08-26 DIAGNOSIS — Z952 Presence of prosthetic heart valve: Secondary | ICD-10-CM | POA: Diagnosis not present

## 2019-08-26 DIAGNOSIS — I4891 Unspecified atrial fibrillation: Secondary | ICD-10-CM

## 2019-08-26 DIAGNOSIS — Z5181 Encounter for therapeutic drug level monitoring: Secondary | ICD-10-CM

## 2019-08-26 DIAGNOSIS — I4892 Unspecified atrial flutter: Secondary | ICD-10-CM | POA: Diagnosis not present

## 2019-08-26 DIAGNOSIS — Z950 Presence of cardiac pacemaker: Secondary | ICD-10-CM

## 2019-08-26 DIAGNOSIS — I482 Chronic atrial fibrillation, unspecified: Secondary | ICD-10-CM | POA: Diagnosis not present

## 2019-08-26 DIAGNOSIS — Z8603 Personal history of neoplasm of uncertain behavior: Secondary | ICD-10-CM

## 2019-08-26 LAB — URINALYSIS, COMPLETE
Bilirubin, UA: NEGATIVE
Glucose, UA: NEGATIVE
Ketones, UA: NEGATIVE
Leukocytes,UA: NEGATIVE
Nitrite, UA: NEGATIVE
Protein,UA: NEGATIVE
Specific Gravity, UA: 1.02 (ref 1.005–1.030)
Urobilinogen, Ur: 0.2 mg/dL (ref 0.2–1.0)
pH, UA: 5.5 (ref 5.0–7.5)

## 2019-08-26 LAB — MICROSCOPIC EXAMINATION
Bacteria, UA: NONE SEEN
Epithelial Cells (non renal): NONE SEEN /hpf (ref 0–10)

## 2019-08-26 LAB — POCT INR: INR: 4.1 — AB (ref 2.0–3.0)

## 2019-08-26 NOTE — Progress Notes (Signed)
   08/26/19  CC:  Chief Complaint  Patient presents with  . Follow-up     HPI: History CIS bladder 2002.  Received induction BCG and no recurrences.  Blood pressure 134/81, pulse (!) 105, height 5\' 7"  (1.702 m), weight 204 lb (92.5 kg). NED. A&Ox3.   No respiratory distress   Abd soft, NT, ND Normal phallus with bilateral descended testicles  Cystoscopy Procedure Note  Patient identification was confirmed, informed consent was obtained, and patient was prepped using Betadine solution.  Lidocaine jelly was administered per urethral meatus.     Pre-Procedure: - Inspection reveals a normal caliber urethral meatus.  Procedure: The flexible cystoscope was introduced without difficulty - No urethral strictures/lesions are present. - Moderate lateral lobe enlargement prostate  - Normal bladder neck - Bilateral ureteral orifices identified - Bladder mucosa  reveals no ulcers, tumors, or lesions - No bladder stones -Mild to moderate trabeculation  Retroflexion shows no abnormalities   Post-Procedure: - Patient tolerated the procedure well  Assessment/ Plan: No evidence recurrent disease.  Continue annual surveillance cystoscopy.   Abbie Sons, MD

## 2019-08-26 NOTE — Patient Instructions (Signed)
Please have a serving of greens today, skip warfarin tonight, then continue dosage of 1.5 tablets every day. Recheck INR in 3 weeks.

## 2019-09-01 ENCOUNTER — Telehealth: Payer: Self-pay | Admitting: Family Medicine

## 2019-09-01 NOTE — Chronic Care Management (AMB) (Signed)
°  Chronic Care Management   Note  09/01/2019 Name: [name] [name] MRN: [id number] DOB: [birth year]  [name] [name] is a 85 y.o. year old male who is a primary care patient of [name], MD. [name] [name] is currently enrolled in care management services.   Follow up plan: Telephone appointment with CCM team member scheduled for: 09/08/2019  [place], [place] [facility]  [place], [place] [address] Direct Dial: [email or web address]  Website: [email or web address]

## 2019-09-08 ENCOUNTER — Ambulatory Visit: Payer: PPO

## 2019-09-08 NOTE — Chronic Care Management (AMB) (Signed)
  Chronic Care Management   Note  09/08/2019 Name: [name] [name] MRN: [id number] DOB: 06-Mar-1934     Message received from [place] regarding therapy for [name].  Successful outreach with [name] and his spouse. Indicated that [name] was previously receiving Vestibular Rehabilitation Therapy, however services were discontinued due to the assigned therapist leaving the practice. He is interested in resuming therapy if services are available in the area.  Confirmed that this therapy can be provided at [facility]. Provided contact information and offered to assist with establishing services. Mr. and Mrs. Farra prefer to contact [employer] regarding the required copay prior to initiating services.   Mr.  [place] declined need for additional care management outreach. Indicated that he was receiving care management services and visits as needed via benefits available with [employer]. Encouraged to inform his primary care provider or contact the team directly if additional assistance is needed.   Horris Latino [facility] [phone]

## 2019-09-09 DIAGNOSIS — Z950 Presence of cardiac pacemaker: Secondary | ICD-10-CM | POA: Diagnosis not present

## 2019-09-09 DIAGNOSIS — Z45018 Encounter for adjustment and management of other part of cardiac pacemaker: Secondary | ICD-10-CM | POA: Diagnosis not present

## 2019-09-10 DIAGNOSIS — G4733 Obstructive sleep apnea (adult) (pediatric): Secondary | ICD-10-CM | POA: Diagnosis not present

## 2019-09-13 ENCOUNTER — Other Ambulatory Visit: Payer: Self-pay | Admitting: Cardiovascular Disease

## 2019-09-14 DIAGNOSIS — Z872 Personal history of diseases of the skin and subcutaneous tissue: Secondary | ICD-10-CM | POA: Diagnosis not present

## 2019-09-14 DIAGNOSIS — L578 Other skin changes due to chronic exposure to nonionizing radiation: Secondary | ICD-10-CM | POA: Diagnosis not present

## 2019-09-14 DIAGNOSIS — L821 Other seborrheic keratosis: Secondary | ICD-10-CM | POA: Diagnosis not present

## 2019-09-14 DIAGNOSIS — Z86018 Personal history of other benign neoplasm: Secondary | ICD-10-CM | POA: Diagnosis not present

## 2019-09-14 DIAGNOSIS — L570 Actinic keratosis: Secondary | ICD-10-CM | POA: Diagnosis not present

## 2019-09-14 NOTE — Telephone Encounter (Signed)
Refill request

## 2019-09-16 ENCOUNTER — Other Ambulatory Visit: Payer: Self-pay

## 2019-09-16 ENCOUNTER — Ambulatory Visit (INDEPENDENT_AMBULATORY_CARE_PROVIDER_SITE_OTHER): Payer: PPO

## 2019-09-16 DIAGNOSIS — I482 Chronic atrial fibrillation, unspecified: Secondary | ICD-10-CM | POA: Diagnosis not present

## 2019-09-16 DIAGNOSIS — Z5181 Encounter for therapeutic drug level monitoring: Secondary | ICD-10-CM

## 2019-09-16 DIAGNOSIS — I4891 Unspecified atrial fibrillation: Secondary | ICD-10-CM | POA: Diagnosis not present

## 2019-09-16 DIAGNOSIS — Z950 Presence of cardiac pacemaker: Secondary | ICD-10-CM

## 2019-09-16 DIAGNOSIS — Z952 Presence of prosthetic heart valve: Secondary | ICD-10-CM

## 2019-09-16 DIAGNOSIS — I4892 Unspecified atrial flutter: Secondary | ICD-10-CM | POA: Diagnosis not present

## 2019-09-16 LAB — POCT INR: INR: 3.9 — AB (ref 2.0–3.0)

## 2019-09-16 NOTE — Patient Instructions (Signed)
Please skip warfarin tonight, then START NEW DOSAGE of 1.5 tablets every day EXCEPT 1 tablet on [place].  Recheck INR in 2 weeks.

## 2019-09-21 ENCOUNTER — Other Ambulatory Visit: Payer: Self-pay | Admitting: Family Medicine

## 2019-09-21 ENCOUNTER — Other Ambulatory Visit: Payer: Self-pay | Admitting: Cardiovascular Disease

## 2019-09-24 DIAGNOSIS — Z950 Presence of cardiac pacemaker: Secondary | ICD-10-CM | POA: Diagnosis not present

## 2019-09-24 DIAGNOSIS — Z45018 Encounter for adjustment and management of other part of cardiac pacemaker: Secondary | ICD-10-CM | POA: Diagnosis not present

## 2019-09-30 ENCOUNTER — Other Ambulatory Visit: Payer: Self-pay

## 2019-09-30 ENCOUNTER — Ambulatory Visit (INDEPENDENT_AMBULATORY_CARE_PROVIDER_SITE_OTHER): Payer: PPO

## 2019-09-30 DIAGNOSIS — I482 Chronic atrial fibrillation, unspecified: Secondary | ICD-10-CM | POA: Diagnosis not present

## 2019-09-30 DIAGNOSIS — Z5181 Encounter for therapeutic drug level monitoring: Secondary | ICD-10-CM

## 2019-09-30 DIAGNOSIS — I4892 Unspecified atrial flutter: Secondary | ICD-10-CM | POA: Diagnosis not present

## 2019-09-30 DIAGNOSIS — Z950 Presence of cardiac pacemaker: Secondary | ICD-10-CM

## 2019-09-30 DIAGNOSIS — Z952 Presence of prosthetic heart valve: Secondary | ICD-10-CM

## 2019-09-30 DIAGNOSIS — I4891 Unspecified atrial fibrillation: Secondary | ICD-10-CM

## 2019-09-30 LAB — POCT INR: INR: 2.4 (ref 2.0–3.0)

## 2019-09-30 NOTE — Patient Instructions (Signed)
Please take 2 tablets warfarin tonight, then resume dosage of 1.5 tablets every day EXCEPT 1 tablet on [place].  Recheck INR in 4 weeks.

## 2019-10-12 ENCOUNTER — Other Ambulatory Visit: Payer: Self-pay | Admitting: Family Medicine

## 2019-10-12 DIAGNOSIS — K279 Peptic ulcer, site unspecified, unspecified as acute or chronic, without hemorrhage or perforation: Secondary | ICD-10-CM

## 2019-10-12 MED ORDER — PANTOPRAZOLE SODIUM 40 MG PO TBEC: 40.0000 mg | DELAYED_RELEASE_TABLET | Freq: Two times a day (BID) | ORAL | 3 refills | Status: DC

## 2019-10-12 NOTE — Telephone Encounter (Signed)
Requested Prescriptions  Pending Prescriptions Disp Refills  . pantoprazole (PROTONIX) 40 MG tablet 60 tablet 3    Sig: Take 1 tablet (40 mg total) by mouth 2 (two) times daily.     Gastroenterology: Proton Pump Inhibitors Passed - 10/12/2019 10:01 AM      Passed - Valid encounter within last 12 months    Recent Outpatient Visits          2 months ago Type 2 diabetes mellitus without complication, without long-term current use of insulin [facility])   [facility] [name], MD   3 months ago Chronic atrial fibrillation   [facility] [name], MD   6 months ago Type 2 diabetes mellitus without complication, without long-term current use of insulin [facility])   [facility] [name], MD   10 months ago Vertigo   [facility] [name], MD   1 year ago PUD (peptic ulcer disease)   [facility] [name], MD      Future Appointments            In 1 month Sharolyn Douglas, Clance Boll, NP [facility], [facility]   In 1 month [name], MD [facility], [place]   In 3 months  [facility], [facility]

## 2019-10-12 NOTE — Telephone Encounter (Signed)
Medication: pantoprazole (PROTONIX) 40 MG tablet [id number]   Has the patient contacted their pharmacy? Yes  (Agent: If no, request that the patient contact the pharmacy for the refill.) (Agent: If yes, when and what did the pharmacy advise?)  Preferred Pharmacy (with phone number or street name): [facility], [place]  Phone:  [phone] Fax:  [phone]     Agent: Please be advised that RX refills may take up to 3 business days. We ask that you follow-up with your pharmacy.

## 2019-10-19 ENCOUNTER — Other Ambulatory Visit: Payer: Self-pay | Admitting: Cardiovascular Disease

## 2019-10-19 ENCOUNTER — Other Ambulatory Visit: Payer: Self-pay

## 2019-10-19 ENCOUNTER — Ambulatory Visit: Payer: PPO

## 2019-10-19 DIAGNOSIS — I4891 Unspecified atrial fibrillation: Secondary | ICD-10-CM | POA: Diagnosis not present

## 2019-10-19 DIAGNOSIS — Z952 Presence of prosthetic heart valve: Secondary | ICD-10-CM | POA: Diagnosis not present

## 2019-10-19 DIAGNOSIS — I4892 Unspecified atrial flutter: Secondary | ICD-10-CM

## 2019-10-19 DIAGNOSIS — Z5181 Encounter for therapeutic drug level monitoring: Secondary | ICD-10-CM

## 2019-10-19 DIAGNOSIS — Z950 Presence of cardiac pacemaker: Secondary | ICD-10-CM | POA: Diagnosis not present

## 2019-10-19 DIAGNOSIS — I482 Chronic atrial fibrillation, unspecified: Secondary | ICD-10-CM | POA: Diagnosis not present

## 2019-10-19 LAB — POCT INR: INR: 2.5 (ref 2.0–3.0)

## 2019-10-19 NOTE — Telephone Encounter (Signed)
Refill request

## 2019-10-19 NOTE — Patient Instructions (Addendum)
Please continue dosage of 1.5 tablets every day EXCEPT 1 tablet on [place].  Recheck INR when you get back from the beach at the end of February.  While you are gone to the beach, if you can have his INR checked at the beginning February, please fax the results to [phone], attn: Mandi.

## 2019-11-03 ENCOUNTER — Telehealth: Payer: Self-pay

## 2019-11-03 NOTE — Telephone Encounter (Signed)
Copied from [place] [phone]. Topic: General - Other >> Lar[place] 19, 2021  3:32 PM Wynetta Emery, [place] C wrote: Reason for CRM:  pt's spouse called in to be advised. Pt is scheduled to have covid vaccine, spouse would like to make sure that it is okay for him to have due to health history?   CB: [id number]

## 2019-11-09 NOTE — Telephone Encounter (Signed)
Left voicemail advising patient's spouse.

## 2019-11-09 NOTE — Telephone Encounter (Signed)
yes

## 2019-11-13 ENCOUNTER — Other Ambulatory Visit: Payer: Self-pay | Admitting: Cardiovascular Disease

## 2019-11-13 NOTE — Telephone Encounter (Signed)
Refill Request.  

## 2019-11-16 ENCOUNTER — Telehealth: Payer: Self-pay | Admitting: Cardiovascular Disease

## 2019-11-16 DIAGNOSIS — I4891 Unspecified atrial fibrillation: Secondary | ICD-10-CM

## 2019-11-16 DIAGNOSIS — I4892 Unspecified atrial flutter: Secondary | ICD-10-CM

## 2019-11-16 DIAGNOSIS — Z5181 Encounter for therapeutic drug level monitoring: Secondary | ICD-10-CM

## 2019-11-16 NOTE — Telephone Encounter (Signed)
Patient wife called and states he has a coumadin check at [facility] on 2/8 at 10:30

## 2019-11-16 NOTE — Telephone Encounter (Signed)
Order entered for PT/INR, routed to # requested.

## 2019-11-16 NOTE — Telephone Encounter (Signed)
Patient states he is out of town and needs orders to get his coumadin checked. Please send to [facility] at [facility]. Fax [phone]

## 2019-11-18 ENCOUNTER — Ambulatory Visit: Payer: PPO | Admitting: Nurse Practitioner

## 2019-11-23 ENCOUNTER — Other Ambulatory Visit: Payer: Self-pay | Admitting: Cardiovascular Disease

## 2019-11-23 ENCOUNTER — Telehealth: Payer: Self-pay

## 2019-11-23 DIAGNOSIS — I4892 Unspecified atrial flutter: Secondary | ICD-10-CM | POA: Diagnosis not present

## 2019-11-23 DIAGNOSIS — Z5181 Encounter for therapeutic drug level monitoring: Secondary | ICD-10-CM | POA: Diagnosis not present

## 2019-11-23 NOTE — Telephone Encounter (Signed)
Noted.  Will make [place] office aware to be on the look out for these.

## 2019-11-23 NOTE — Telephone Encounter (Signed)
Patient wife wants Mandi to know [facility] at [place] drew pt inr and these will be resulted and sent to office tomorrow.   Please call

## 2019-11-24 ENCOUNTER — Ambulatory Visit (INDEPENDENT_AMBULATORY_CARE_PROVIDER_SITE_OTHER): Payer: PPO | Admitting: *Deleted

## 2019-11-24 ENCOUNTER — Other Ambulatory Visit: Payer: Self-pay

## 2019-11-24 DIAGNOSIS — Z5181 Encounter for therapeutic drug level monitoring: Secondary | ICD-10-CM

## 2019-11-24 DIAGNOSIS — I4891 Unspecified atrial fibrillation: Secondary | ICD-10-CM

## 2019-11-24 DIAGNOSIS — I4892 Unspecified atrial flutter: Secondary | ICD-10-CM

## 2019-11-24 DIAGNOSIS — I482 Chronic atrial fibrillation, unspecified: Secondary | ICD-10-CM

## 2019-11-24 LAB — PROTIME-INR
INR: 2.8 — ABNORMAL HIGH (ref 0.9–1.2)
Prothrombin Time: 28.8 s — ABNORMAL HIGH (ref 9.1–12.0)

## 2019-11-24 NOTE — Patient Instructions (Signed)
Description   Spoke with pt's wife-Mary on DPR and instructed pt to continue Warfarin 1.5 tablets every day EXCEPT 1 tablet on [place]. Recheck INR in 3-4 weeks prior to [facility].

## 2019-11-24 NOTE — Telephone Encounter (Signed)
Received results and called pt but had to leave a message. Wife called back and the INR results/dosing instructions were taken care of. Also, made the pt an appt in the office prior to next trip out of town. Please refer to Anticoagulation Encounter for complete details. Thanks.

## 2019-11-30 ENCOUNTER — Other Ambulatory Visit: Payer: Self-pay

## 2019-11-30 ENCOUNTER — Ambulatory Visit: Payer: PPO | Attending: Internal Medicine

## 2019-11-30 ENCOUNTER — Ambulatory Visit: Payer: Self-pay | Admitting: Family Medicine

## 2019-11-30 DIAGNOSIS — Z23 Encounter for immunization: Secondary | ICD-10-CM | POA: Insufficient documentation

## 2019-11-30 NOTE — Progress Notes (Signed)
   Covid-19 Vaccination Clinic  Name:  [name] [name]    MRN: [id number] DOB: 21-Feb-1934  11/30/2019  [name] was observed post Covid-19 immunization for 15 minutes without incidence. He was provided with Vaccine Information Sheet and instruction to access the V-Safe system.   [name] was instructed to call 911 with any severe reactions post vaccine: [name] Difficulty breathing  . Swelling of your face and throat  . A fast heartbeat  . A bad rash all over your body  . Dizziness and weakness    Immunizations Administered    Name Date Dose VIS Date Route   Pfizer COVID-19 Vaccine 11/30/2019  8:21 AM 0.3 mL 09/25/2019 Intramuscular   Manufacturer: [place]   Lot: [id number]   [place]: [id number]

## 2019-12-04 ENCOUNTER — Other Ambulatory Visit: Payer: Self-pay | Admitting: *Deleted

## 2019-12-04 DIAGNOSIS — C641 Malignant neoplasm of right kidney, except renal pelvis: Secondary | ICD-10-CM

## 2019-12-04 DIAGNOSIS — R590 Localized enlarged lymph nodes: Secondary | ICD-10-CM

## 2019-12-07 ENCOUNTER — Ambulatory Visit
Admission: RE | Admit: 2019-12-07 | Discharge: 2019-12-07 | Disposition: A | Discharge status: Home / Self Care | Payer: PPO | Source: Ambulatory Visit | Attending: Internal Medicine | Admitting: Internal Medicine

## 2019-12-07 ENCOUNTER — Inpatient Hospital Stay: Payer: PPO | Attending: Internal Medicine

## 2019-12-07 ENCOUNTER — Other Ambulatory Visit: Payer: Self-pay

## 2019-12-07 DIAGNOSIS — Z7901 Long term (current) use of anticoagulants: Secondary | ICD-10-CM | POA: Diagnosis not present

## 2019-12-07 DIAGNOSIS — E7800 Pure hypercholesterolemia, unspecified: Secondary | ICD-10-CM | POA: Insufficient documentation

## 2019-12-07 DIAGNOSIS — Z7982 Long term (current) use of aspirin: Secondary | ICD-10-CM | POA: Insufficient documentation

## 2019-12-07 DIAGNOSIS — Z79899 Other long term (current) drug therapy: Secondary | ICD-10-CM | POA: Diagnosis not present

## 2019-12-07 DIAGNOSIS — Z7984 Long term (current) use of oral hypoglycemic drugs: Secondary | ICD-10-CM | POA: Insufficient documentation

## 2019-12-07 DIAGNOSIS — R590 Localized enlarged lymph nodes: Secondary | ICD-10-CM | POA: Insufficient documentation

## 2019-12-07 DIAGNOSIS — Z8042 Family history of malignant neoplasm of prostate: Secondary | ICD-10-CM | POA: Diagnosis not present

## 2019-12-07 DIAGNOSIS — I421 Obstructive hypertrophic cardiomyopathy: Secondary | ICD-10-CM | POA: Diagnosis not present

## 2019-12-07 DIAGNOSIS — Z87891 Personal history of nicotine dependence: Secondary | ICD-10-CM | POA: Insufficient documentation

## 2019-12-07 DIAGNOSIS — I10 Essential (primary) hypertension: Secondary | ICD-10-CM | POA: Diagnosis not present

## 2019-12-07 DIAGNOSIS — Z8551 Personal history of malignant neoplasm of bladder: Secondary | ICD-10-CM | POA: Insufficient documentation

## 2019-12-07 DIAGNOSIS — C641 Malignant neoplasm of right kidney, except renal pelvis: Secondary | ICD-10-CM

## 2019-12-07 DIAGNOSIS — I4891 Unspecified atrial fibrillation: Secondary | ICD-10-CM | POA: Diagnosis not present

## 2019-12-07 DIAGNOSIS — E1140 Type 2 diabetes mellitus with diabetic neuropathy, unspecified: Secondary | ICD-10-CM | POA: Insufficient documentation

## 2019-12-07 DIAGNOSIS — Z85528 Personal history of other malignant neoplasm of kidney: Secondary | ICD-10-CM | POA: Diagnosis not present

## 2019-12-07 DIAGNOSIS — Z8673 Personal history of transient ischemic attack (TIA), and cerebral infarction without residual deficits: Secondary | ICD-10-CM | POA: Diagnosis not present

## 2019-12-07 DIAGNOSIS — Z803 Family history of malignant neoplasm of breast: Secondary | ICD-10-CM | POA: Insufficient documentation

## 2019-12-07 DIAGNOSIS — Z8249 Family history of ischemic heart disease and other diseases of the circulatory system: Secondary | ICD-10-CM | POA: Diagnosis not present

## 2019-12-07 DIAGNOSIS — R918 Other nonspecific abnormal finding of lung field: Secondary | ICD-10-CM | POA: Diagnosis not present

## 2019-12-07 DIAGNOSIS — G4733 Obstructive sleep apnea (adult) (pediatric): Secondary | ICD-10-CM | POA: Insufficient documentation

## 2019-12-07 LAB — CBC WITH DIFFERENTIAL/PLATELET
Abs Immature Granulocytes: 0.03 10*3/uL (ref 0.00–0.07)
Basophils Absolute: 0.1 10*3/uL (ref 0.0–0.1)
Basophils Relative: 1 %
Eosinophils Absolute: 0.4 10*3/uL (ref 0.0–0.5)
Eosinophils Relative: 6 %
HCT: 40.7 % (ref 39.0–52.0)
Hemoglobin: 13.3 g/dL (ref 13.0–17.0)
Immature Granulocytes: 0 %
Lymphocytes Relative: 26 %
Lymphs Abs: 1.8 10*3/uL (ref 0.7–4.0)
MCH: 30.1 pg (ref 26.0–34.0)
MCHC: 32.7 g/dL (ref 30.0–36.0)
MCV: 92.1 fL (ref 80.0–100.0)
Monocytes Absolute: 0.7 10*3/uL (ref 0.1–1.0)
Monocytes Relative: 10 %
Neutro Abs: 3.8 10*3/uL (ref 1.7–7.7)
Neutrophils Relative %: 57 %
Platelets: 179 10*3/uL (ref 150–400)
RBC: 4.42 MIL/uL (ref 4.22–5.81)
RDW: 13.3 % (ref 11.5–15.5)
WBC: 6.8 10*3/uL (ref 4.0–10.5)
nRBC: 0 % (ref 0.0–0.2)

## 2019-12-07 LAB — COMPREHENSIVE METABOLIC PANEL
ALT: 21 U/L (ref 0–44)
AST: 34 U/L (ref 15–41)
Albumin: 4.3 g/dL (ref 3.5–5.0)
Alkaline Phosphatase: 73 U/L (ref 38–126)
Anion gap: 10 (ref 5–15)
BUN: 23 mg/dL (ref 8–23)
CO2: 26 mmol/L (ref 22–32)
Calcium: 9.2 mg/dL (ref 8.9–10.3)
Chloride: 101 mmol/L (ref 98–111)
Creatinine, Ser: 1.11 mg/dL (ref 0.61–1.24)
GFR calc Af Amer: >60 mL/min (ref >60)
GFR calc non Af Amer: >60 mL/min (ref >60)
Glucose, Bld: 155 mg/dL — ABNORMAL HIGH (ref 70–99)
Potassium: 4.3 mmol/L (ref 3.5–5.1)
Sodium: 137 mmol/L (ref 135–145)
Total Bilirubin: 0.7 mg/dL (ref 0.3–1.2)
Total Protein: 8.5 g/dL — ABNORMAL HIGH (ref 6.5–8.1)

## 2019-12-07 LAB — LACTATE DEHYDROGENASE: LDH: 256 U/L — ABNORMAL HIGH (ref 98–192)

## 2019-12-07 MED ORDER — IOHEXOL 300 MG/ML  SOLN
75.0000 mL | Freq: Once | INTRAMUSCULAR | Status: AC | PRN
  Administered 2019-12-07: 75 mL via INTRAVENOUS

## 2019-12-08 DIAGNOSIS — B351 Tinea unguium: Secondary | ICD-10-CM | POA: Diagnosis not present

## 2019-12-08 DIAGNOSIS — E119 Type 2 diabetes mellitus without complications: Secondary | ICD-10-CM | POA: Diagnosis not present

## 2019-12-09 ENCOUNTER — Other Ambulatory Visit: Payer: Self-pay

## 2019-12-09 ENCOUNTER — Other Ambulatory Visit: Payer: PPO

## 2019-12-09 ENCOUNTER — Inpatient Hospital Stay: Payer: PPO | Attending: Internal Medicine | Admitting: Internal Medicine

## 2019-12-09 ENCOUNTER — Encounter: Payer: Self-pay | Admitting: Internal Medicine

## 2019-12-09 DIAGNOSIS — G588 Other specified mononeuropathies: Secondary | ICD-10-CM | POA: Diagnosis not present

## 2019-12-09 DIAGNOSIS — C641 Malignant neoplasm of right kidney, except renal pelvis: Secondary | ICD-10-CM | POA: Diagnosis not present

## 2019-12-09 DIAGNOSIS — R590 Localized enlarged lymph nodes: Secondary | ICD-10-CM | POA: Insufficient documentation

## 2019-12-09 DIAGNOSIS — R9389 Abnormal findings on diagnostic imaging of other specified body structures: Secondary | ICD-10-CM | POA: Diagnosis not present

## 2019-12-09 NOTE — Assessment & Plan Note (Addendum)
#   Mediastinal LN/right supraclavicular adenopathy 1-2 centimeters lymph node. [Since December 2018].  PET scan-August 2020 compared to imaging February 2020-stable.  February 2021-CT scan chest-similarly 2 cm stable mediastinal lymph nodes/supraclavicular adenopathy-not significantly changed.  #Again discussed long differential-low-grade lymphoma versus benign/reactive.  Highly suspicious of low-grade lymphoma.  Discussed that as patient is currently asymptomatic/and given multiple comorbidities-I think is reasonable to hold off any biopsy at this time.  The understand that asymptomatic low-grade lymphomas-surveillance is a standard of care.   # RCC s/p Right ablation--2020 -PET scan.  # Dizyness/vestibular neuritis-i improved followed by Dr. Pryor Ochoa ENT  # DISPOSITION: # in  6 months- MD/labs-cbc/cmp/ldh-/CT scan prior Dr.B  # I reviewed the blood work- with the patient in detail; also reviewed the imaging independently [as summarized above]; and with the patient in detail.  [name]

## 2019-12-09 NOTE — Progress Notes (Signed)
[name] [name] NOTE  Patient Care Team: [name], MD as PCP - General (Unknown Physician Specialty) Wellington Hampshire, MD as PCP - Cardiology (Cardiology) Margaretha Sheffield, MD (Otolaryngology) Leandrew Koyanagi, MD as Referring Physician (Ophthalmology) Sharlotte Alamo, DPM as Consulting Physician (Podiatry) Bernardo Heater, Ronda Fairly, MD as Consulting Physician (Urology) Jannet Mantis, MD as Consulting Physician (Dermatology) Telford Nab, RN as Registered Nurse [name], MD as Consulting Physician (Gastroenterology) Farrel Demark, Deanne Coffer, PA-C (Cardiology) Cathi Roan, [facility] (Pharmacist) Neldon Labella, RN as Case Manager  CHIEF COMPLAINTS/PURPOSE OF CONSULTATION:  Mediastinal adenopathy  #  Oncology History Overview Note  #February 2020 gastroenteropathy/right supraclavicular adenopathy-incidental]-PET scan 2020-low-grade FDG activity-differential low-grade lymphoma versus benign reactive process. no biopsy  #Right kidney-cancer status post cryo [[facility]]  # 2020- colonoscopy s/p polypectomy  [Dr.Wohl-]  #CAD status post CABG; mechanical valve replacement [2019 August; [facility]]; on Coumadin     Adenocarcinoma, renal cell ([place])  06/13/2015 Initial Diagnosis   Adenocarcinoma, renal cell (HCC)   Cancer of right kidney ([place])  12/10/2018 Initial Diagnosis   Cancer of right kidney [facility])     HISTORY OF PRESENTING ILLNESS:  [name] [name] 86 y.o.  male is here for follow-up to review the results of his CT scan/follow-up of mediastinal/neck adenopathy.  Patient denies any weight loss.  Denies any nausea vomiting.  Denies any chest pain or shortness of breath or cough.  No blood in stools or black or stools.  Patient neuritis is overall improved.  No falls.  No night sweats.  Review of Systems  Constitutional: Positive for malaise/fatigue. Negative for chills, diaphoresis, fever and weight loss.  HENT: Negative for nosebleeds and sore  throat.   Eyes: Negative for double vision.  Respiratory: Negative for cough, hemoptysis, sputum production, shortness of breath and wheezing.   Cardiovascular: Negative for chest pain, palpitations, orthopnea and leg swelling.  Gastrointestinal: Negative for abdominal pain, blood in stool, constipation, diarrhea, heartburn, melena, nausea and vomiting.  Genitourinary: Negative for dysuria, frequency and urgency.  Musculoskeletal: Negative for back pain and joint pain.  Skin: Negative.  Negative for itching and rash.  Neurological: Positive for dizziness. Negative for tingling, focal weakness, weakness and headaches.  Endo/Heme/Allergies: Does not bruise/bleed easily.  Psychiatric/Behavioral: Negative for depression. The patient is not nervous/anxious and does not have insomnia.      MEDICAL HISTORY:  Past Medical History:  Diagnosis Date  . Aortic stenosis    a. 01/2016 echo: mod AS; b. 04/2018 Cardiac MRI: sev AS; c. 05/2018 s/p mech AVR @ time of myomectomy; d. 07/2018 Echo: Mild AS/AI.  [name] Atherosclerosis of aorta ([place])    by CT scan in past  . Atrial fibrillation ([place])    and aflutter. pt has a left atrial circuit that is not ablated. was on amiodarone-stopped, now use rate control.   . Bladder cancer ([place])    hx; treated with BCG in past   . Carotid artery disease ([place])    There was calcified plaque but no stenosis by carotid artery screening done at [facility] October, 2013  . CHB (complete heart block) ([place])    a. 05/2018 s/p BSX VVIR PPM ([facility]) in setting of bradycardia following myomectomy and AVR/MVR.  . Diabetes mellitus    type not specified  . Diabetic neuropathy (HCC)    feet  . Elevated liver enzymes    over time; hx  . Excessive sweating   . Glaucoma   . Gout   .  Head injury    when slipped on ice 2004-2005. stabilized and back on Coumadin   . Headache    migraines - distant past  . History of cardiac cath    a. 05/2018 Cath ([facility]): Non-obstructive  CAD. Mildly elevated filling pressures w/ nl CI.  [name] HOCM (hypertrophic obstructive cardiomyopathy) ([place])    a. 01/2016 Echo: EF 65-70%, no rwma, LVOT gradient of 80-61mmHg, mod AS, SAM; b. 11/2017 Echo: EF 55-60%, near cavity obliteration in systole, mod AS, mild MS; c. 04/2018 Card MRI ([facility]): Sev LVH, EF >70%, Sev AS, LVOT obs w/ Sev MR, mild to mod TR/PR, mid-myocardial HE in basal-mid anteroseptum and inferoseptum; d. 05/2018 s/p Septal myomectomy; e. 07/2018 Echo: EF 60-65%. PASP 49mmHg.  [name] Homocystinemia ([place])    elevated, mild   . Hypercholesterolemia    treated.   . Hypertension   . Infection of right inner ear   . Kidney mass    a. s/p laproscopic surgery woth cryoablation of a mass outside kidney followed at [facility]; b. 11/2018 CT Chest: 2cm R kidney mass.  . Mediastinal adenopathy    a. 12/208 CT Chest: up to 53mm LLL lung nodule. 1.5-1.7cm subcarinal/mediastinal adenopahty/right paratracheal lymph node; b. 11/2018 CT Chest: 2.5-3cm mediastinal adenopathy.  . Mitral regurgitation    a. 04/2018 Cardiac MRI: sev MR; b. 05/2018 s/p mech MVR @ time of myomectomy; c. 07/2018 Echo: Mild MS.  . Motion sickness    ocean boats  . Nausea   . Nonobstructive coronary artery disease    a. 05/2018 Cath ([facility]): nonobs CAD.  [name] Obstructive sleep apnea    CPAP started successfully 2014  . Orthostatic hypotension    a. orthostatic. dehydration. hospitalized 11/11  . Sleep apnea    Significant obstructive sleep apnea diagnosed in August, 2012, the patient is to receive CPAP   . Stroke [facility])    "2 old strokes" CT and MRI. [place] hospital 11/11. diagnosis was dehydration, no acutal reports.   . TSH elevation    on synthroid historically   . Unsteady gait    August, 2012    SURGICAL HISTORY: Past Surgical History:  Procedure Laterality Date  . BLADDER SURGERY    . CATARACT EXTRACTION W/PHACO Left 05/11/2015   Procedure: CATARACT EXTRACTION PHACO AND INTRAOCULAR LENS PLACEMENT (IOC);  Surgeon: Leandrew Koyanagi, MD;  Location: [place];  Service: Ophthalmology;  Laterality: Left;  DIABETIC - oral meds, CPAP  . COLONOSCOPY WITH PROPOFOL N/A 04/14/2019   Procedure: COLONOSCOPY WITH PROPOFOL;  Surgeon: [name], MD;  Location: [facility] ENDOSCOPY;  Service: Endoscopy;  Laterality: N/A;  . ESOPHAGOGASTRODUODENOSCOPY (EGD) WITH PROPOFOL N/A 07/01/2018   Procedure: ESOPHAGOGASTRODUODENOSCOPY (EGD) WITH PROPOFOL;  Surgeon: [name], MD;  Location: [facility] ENDOSCOPY;  Service: Endoscopy;  Laterality: N/A;  . INSERT / REPLACE / REMOVE PACEMAKER     Chemical engineer   . KIDNEY SURGERY     "froze mass"  . MECHANICAL AORTIC AND MITRAL VALVE REPLACEMENT  05/2018   [facility]   . TONSILLECTOMY    . VALVE REPLACEMENT      SOCIAL HISTORY: Social History   Socioeconomic History  . Marital status: Married    Spouse name: Jasmine Awe  . Number of children: 1  . Years of education: 61  . Highest education level: Bachelor's degree (e.g., BA, AB, BS)  Occupational History  . Occupation: retired  Tobacco Use  . Smoking status: Former Smoker    Types: Cigars, Cigarettes    Quit date: 10/02/1974  Years since quitting: 45.2  . Smokeless tobacco: Never Used  . Tobacco comment: still smokes cigars once a day  Substance and Sexual Activity  . Alcohol use: No  . Drug use: No  . Sexual activity: Yes  Other Topics Concern  . Not on file  Social History Narrative   Married, retired, gets regular exercise.    Social Determinants of Health   Financial Resource Strain:   . Difficulty of Paying Living Expenses: Not on file  Food Insecurity:   . Worried About Charity fundraiser in the Last Year: Not on file  . Ran Out of Food in the Last Year: Not on file  Transportation Needs:   . Lack of Transportation (Medical): Not on file  . Lack of Transportation (Non-Medical): Not on file  Physical Activity:   . Days of Exercise per Week: Not on file  . Minutes of Exercise per Session: Not on file   Stress: No Stress Concern Present  . Feeling of Stress : Only a little  Social Connections: Unknown  . Frequency of Communication with Friends and Family: Patient refused  . Frequency of Social Gatherings with Friends and Family: Patient refused  . Attends Religious Services: Patient refused  . Active Member of Clubs or Organizations: Patient refused  . Attends Archivist Meetings: Patient refused  . Marital Status: Patient refused  Intimate Partner Violence: Unknown  . Fear of Current or Ex-Partner: Patient refused  . Emotionally Abused: Patient refused  . Physically Abused: Patient refused  . Sexually Abused: Patient refused    FAMILY HISTORY: Family History  Problem Relation Age of Onset  . Arrhythmia Father        A-Fib  . Prostate cancer Father   . Stroke Father   . Breast cancer Mother   . Arrhythmia Brother        A-Fib  . Heart attack Neg Hx   . Hypertension Neg Hx     ALLERGIES:  is allergic to macrolides and ketolides; mycinettes; nitrofuran derivatives; nitrofurantoin; erythromycin; zofran [ondansetron hcl-dextrose]; and zofran [ondansetron hcl].  MEDICATIONS:  Current Outpatient Medications  Medication Sig Dispense Refill  . ALPRAZolam (XANAX) 0.25 MG tablet Take 1 tablet (0.25 mg total) by mouth every 6 (six) hours as needed for anxiety. 60 tablet 0  . aspirin EC 81 MG tablet Take 1 tablet (81 mg total) by mouth daily.    [name] glucose blood (ONE TOUCH ULTRA TEST) test strip USE ONE STRIP TO CHECK GLUCOSE ONCE DAILY 100 each 12  . Lancets (ONETOUCH DELICA PLUS 123XX123) MISC USE 1  TO CHECK GLUCOSE ONCE DAILY 100 each 11  . latanoprost (XALATAN) 0.005 % ophthalmic solution Place 1 drop into both eyes at bedtime.     [name] levothyroxine (SYNTHROID) 50 MCG tablet Take 1 tablet (50 mcg total) by mouth daily. 90 tablet 3  . meclizine (ANTIVERT) 12.5 MG tablet Take 1 tablet (12.5 mg total) by mouth 3 (three) times daily as needed for dizziness. 90 tablet 3  .  metFORMIN (GLUCOPHAGE) 1000 MG tablet Take 1 tablet by mouth once daily with breakfast 90 tablet 0  . metoprolol tartrate (LOPRESSOR) 25 MG tablet Take 1/2 (one-half) tablet by mouth twice daily 90 tablet 0  . ONETOUCH DELICA LANCETS 99991111 MISC USE ONE LANCET TO CHECK BLOOD GLUCOSE ONCE DAILY 100 each 12  . ONETOUCH ULTRA test strip USE 1 STRIP TO CHECK GLUCOSE ONCE DAILY 50 each 11  . pantoprazole (PROTONIX) 40 MG tablet Take  1 tablet (40 mg total) by mouth 2 (two) times daily. 60 tablet 3  . simvastatin (ZOCOR) 40 MG tablet TAKE 1/2 (ONE-HALF) TABLET BY MOUTH AT BEDTIME 45 tablet 0  . warfarin (COUMADIN) 3 MG tablet TAKE 1 & 1/2  (ONE & ONE-HALF) TABLETS BY MOUTH DAILY EXCEPT 1 TABLET ON Monday AND Friday OR AS DIRECTED BY COUMADIN CLINIC. 45 tablet 3  . amLODipine (NORVASC) 2.5 MG tablet Take 1 tablet (2.5 mg total) by mouth daily. 90 tablet 3   No current facility-administered medications for this visit.      [name]  PHYSICAL EXAMINATION: ECOG PERFORMANCE STATUS: 0 - Asymptomatic  Vitals:   12/09/19 1408  BP: (!) 141/65  Pulse: 85  Temp: (!) 97.2 F (36.2 C)   Filed Weights   12/09/19 1408  Weight: 200 lb (90.7 kg)    Physical Exam  Constitutional: He is oriented to person, place, and time and well-developed, well-nourished, and in no distress.  Patient is walking himself.  He is walking with a cane.  He is accompanied by his wife.  HENT:  Head: Normocephalic and atraumatic.  Mouth/Throat: Oropharynx is clear and moist. No oropharyngeal exudate.  Eyes: Pupils are equal, round, and reactive to light.  Cardiovascular: Normal rate and regular rhythm.  Pulmonary/Chest: Effort normal and breath sounds normal. No respiratory distress. He has no wheezes.  Abdominal: Soft. Bowel sounds are normal. He exhibits no distension and no mass. There is no abdominal tenderness. There is no rebound and no guarding.  Musculoskeletal:        General: No tenderness or edema. Normal range of motion.      Cervical back: Normal range of motion and neck supple.  Neurological: He is alert and oriented to person, place, and time.  Skin: Skin is warm.  Psychiatric: Affect normal.     LABORATORY DATA:  I have reviewed the data as listed Lab Results  Component Value Date   WBC 6.8 12/07/2019   HGB 13.3 12/07/2019   HCT 40.7 12/07/2019   MCV 92.1 12/07/2019   PLT 179 12/07/2019   Recent Labs    07/30/19 0956 12/07/19 0912  NA 139 137  K 4.9 4.3  CL 97 101  CO2  --  26  GLUCOSE 124* 155*  BUN 17 23  CREATININE 1.05 1.11  CALCIUM 9.3 9.2  GFRNONAA 65 >60  GFRAA 76 >60  PROT 7.2 8.5*  ALBUMIN 4.2 4.3  AST 34 34  ALT  --  21  ALKPHOS 72 73  BILITOT 0.7 0.7    RADIOGRAPHIC STUDIES: I have personally reviewed the radiological images as listed and agreed with the findings in the report. CT CHEST W CONTRAST  Result Date: 12/07/2019 CLINICAL DATA:  Mediastinal lymphadenopathy. EXAM: CT CHEST WITH CONTRAST TECHNIQUE: Multidetector CT imaging of the chest was performed during intravenous contrast administration. CONTRAST:  63mL OMNIPAQUE IOHEXOL 300 MG/ML  SOLN COMPARISON:  PET-CT 06/09/2019. outside CT 11/27/2018 FINDINGS: Cardiovascular: The heart size is normal. No substantial pericardial effusion. Coronary artery calcification is evident. Atherosclerotic calcification is noted in the wall of the thoracic aorta. Status post CABG. Left permanent pacemaker again noted. Mediastinum/Nodes: Right supraclavicular and mediastinal lymphadenopathy again noted. The right-sided level IV lymph node measured previously at 1.0 cm short axis is stable at 1.0 cm (image 11/series 2). Index right paratracheal node measured previously at 1.8 cm short axis is stable at 1.8 cm short axis today. 1.3 cm short axis subcarinal lymph node is stable at 1.3  cm today. Additional mediastinal lymph nodes that are upper normal to borderline enlarged on today's study are similarly stable in the interval. The esophagus  has normal imaging features. There is no axillary lymphadenopathy. Lungs/Pleura: 6 mm subpleural nodule anterior right upper lobe (44/3) is similar to previous PET-CT and stable when I remeasure it on an outside CT scan performed at [facility] on 11/27/2018. 9 mm ground-glass nodule in the anterior left upper lobe medially (58/3) is also stable since the 11/28/2018 study. Stable 4 mm left upper lobe nodule on 66/3. Stable 5 mm lingular nodule on 83/3. No focal airspace consolidation.  No pleural effusion. Upper Abdomen: Nodular hepatic contour with prominent lateral segment left liver and caudate lobe suggest cirrhosis. No focal mass lesion evident. Stable calcified lesion interpolar left kidney, non hypermetabolic on previous PET-CT. Musculoskeletal: No worrisome lytic or sclerotic osseous abnormality. IMPRESSION: 1. Stable exam. Mediastinal and right supraclavicular lymphadenopathy is stable in the interval since previous PET-CT of 06/09/2019. 2. Stable bilateral pulmonary nodules since 11/27/2018, consistent with benign etiology. 3. Nodular hepatic contour suggests cirrhosis. 4. Aortic Atherosclerosis (ICD10-I70.0). Electronically Signed   By: Misty Stanley M.D.   On: 12/07/2019 12:38    ASSESSMENT & PLAN:   Mediastinal lymphadenopathy # Mediastinal LN/right supraclavicular adenopathy 1-2 centimeters lymph node. [Since December 2018].  PET scan-August 2020 compared to imaging February 2020-stable.  February 2021-CT scan chest-similarly 2 cm stable mediastinal lymph nodes/supraclavicular adenopathy-not significantly changed.  #Again discussed long differential-low-grade lymphoma versus benign/reactive.  Highly suspicious of low-grade lymphoma.  Discussed that as patient is currently asymptomatic/and given multiple comorbidities-I think is reasonable to hold off any biopsy at this time.  The understand that asymptomatic low-grade lymphomas-surveillance is a standard of care.   # RCC s/p Right  ablation--2020 -PET scan.  # Dizyness/vestibular neuritis-i improved followed by Dr. Pryor Ochoa ENT  # DISPOSITION: # in  6 months- MD/labs-cbc/cmp/ldh-/CT scan prior Dr.B  # I reviewed the blood work- with the patient in detail; also reviewed the imaging independently [as summarized above]; and with the patient in detail.  .  All questions were answered. The patient knows to call the clinic with any problems, questions or concerns.    Cammie Sickle, MD 12/10/2019 7:15 AM

## 2019-12-10 ENCOUNTER — Ambulatory Visit: Payer: PPO | Admitting: Cardiovascular Disease

## 2019-12-10 ENCOUNTER — Encounter: Payer: Self-pay | Admitting: Cardiovascular Disease

## 2019-12-10 VITALS — BP 128/60 | HR 79 | Ht 69.0 in | Wt 197.0 lb

## 2019-12-10 DIAGNOSIS — Z9889 Other specified postprocedural states: Secondary | ICD-10-CM

## 2019-12-10 DIAGNOSIS — I2510 Atherosclerotic heart disease of native coronary artery without angina pectoris: Secondary | ICD-10-CM | POA: Diagnosis not present

## 2019-12-10 DIAGNOSIS — Z952 Presence of prosthetic heart valve: Secondary | ICD-10-CM | POA: Diagnosis not present

## 2019-12-10 DIAGNOSIS — I10 Essential (primary) hypertension: Secondary | ICD-10-CM | POA: Diagnosis not present

## 2019-12-10 DIAGNOSIS — I482 Chronic atrial fibrillation, unspecified: Secondary | ICD-10-CM | POA: Diagnosis not present

## 2019-12-10 NOTE — Progress Notes (Signed)
Cardiology Office Note   Date:  12/10/2019   ID:  Zier, Palanca 1934/01/27, MRN [id number]  PCP:  [name], MD  Cardiologist:   Kathlyn Sacramento, MD   Chief Complaint  Patient presents with  . Other    6 month follow up. Patient denies chest pain and SOB at this time. Meds reviewed verbally with patient.       History of Present Illness: [name] [name] is a 86 y.o. male who presents for for a follow-up visit. He has extensive past medical history including chronic atrial fibrillation, aortic stenosis, HOCM, hyperlipidemia, hypertension, type 2 diabetes mellitus, sleep apnea, mitral regurgitation, and hypothyroidism.  He was evaluated at [facility] in mid 2019 in the setting of progressive dyspnea and HOCM.  Cardiac MRI showed significant LVH with left ventricular outflow tract obstruction along with severe aortic stenosis and severe mitral regurgitation.  Right and left heart cardiac catheterization showed nonobstructive CAD and normal cardiac index.  He  subsequently underwent successful septal myomectomy with mechanical aortic valve replacement and mechanical mitral valve replacement in August 2019.  Postoperative course complicated by anemia requiring transfusion as well as pleural effusion requiring thoracentesis.  He also had complete heart block which  required placement of a [employer] single lead permanent pacemaker.  He was discharged to rehabilitation and then in September 2019, he was admitted to [facility] regional in the setting of GI bleeding with supratherapeutic INR.  EGD showed duodenal ulcer with a visible vessel that was treated with hemo-spray.  Follow-up echocardiogram October 2019 showed normal LV function with appropriately functioning mechanical valves and improved pulmonary artery systolic pressure.  In February of 2020, he suffered from vestibular neuritis in the right ear with significant hearing loss and balance problems.  He was seen by ENT  and treated with steroids but reports minimal improvement.  He was also diagnosed with mediastinal lymphadenopathy but he is currently being monitored by oncology.  He has been following up with [facility].  Repeat echo in 2020 showed normal LV systolic function and normal functioning mitral and aortic mechanical valves.  PA pressure was mildly elevated at 39.  No significant change from before.    He has been doing extremely well with significant improvement in shortness of breath and no chest pain.  He is able to walk more and they went to the beach recently.  His blood pressure has been well controlled and he takes his medications regularly.  No issues with warfarin.   Past Medical History:  Diagnosis Date  . Aortic stenosis    a. 01/2016 echo: mod AS; b. 04/2018 Cardiac MRI: sev AS; c. 05/2018 s/p mech AVR @ time of myomectomy; d. 07/2018 Echo: Mild AS/AI.  [name] Atherosclerosis of aorta ([place])    by CT scan in past  . Atrial fibrillation ([place])    and aflutter. pt has a left atrial circuit that is not ablated. was on amiodarone-stopped, now use rate control.   . Bladder cancer ([place])    hx; treated with BCG in past   . Carotid artery disease ([place])    There was calcified plaque but no stenosis by carotid artery screening done at [facility] October, 2013  . CHB (complete heart block) ([place])    a. 05/2018 s/p BSX VVIR PPM ([facility]) in setting of bradycardia following myomectomy and AVR/MVR.  . Diabetes mellitus    type not specified  . Diabetic neuropathy (HCC)    feet  .  Elevated liver enzymes    over time; hx  . Excessive sweating   . Glaucoma   . Gout   . Head injury    when slipped on ice 2004-2005. stabilized and back on Coumadin   . Headache    migraines - distant past  . History of cardiac cath    a. 05/2018 Cath ([facility]): Non-obstructive CAD. Mildly elevated filling pressures w/ nl CI.  [name] HOCM (hypertrophic obstructive cardiomyopathy) ([place])    a. 01/2016 Echo: EF 65-70%, no  rwma, LVOT gradient of 80-38mmHg, mod AS, SAM; b. 11/2017 Echo: EF 55-60%, near cavity obliteration in systole, mod AS, mild MS; c. 04/2018 Card MRI ([facility]): Sev LVH, EF >70%, Sev AS, LVOT obs w/ Sev MR, mild to mod TR/PR, mid-myocardial HE in basal-mid anteroseptum and inferoseptum; d. 05/2018 s/p Septal myomectomy; e. 07/2018 Echo: EF 60-65%. PASP 48mmHg.  [name] Homocystinemia ([place])    elevated, mild   . Hypercholesterolemia    treated.   . Hypertension   . Infection of right inner ear   . Kidney mass    a. s/p laproscopic surgery woth cryoablation of a mass outside kidney followed at [facility]; b. 11/2018 CT Chest: 2cm R kidney mass.  . Mediastinal adenopathy    a. 12/208 CT Chest: up to 20mm LLL lung nodule. 1.5-1.7cm subcarinal/mediastinal adenopahty/right paratracheal lymph node; b. 11/2018 CT Chest: 2.5-3cm mediastinal adenopathy.  . Mitral regurgitation    a. 04/2018 Cardiac MRI: sev MR; b. 05/2018 s/p mech MVR @ time of myomectomy; c. 07/2018 Echo: Mild MS.  . Motion sickness    ocean boats  . Nausea   . Nonobstructive coronary artery disease    a. 05/2018 Cath ([facility]): nonobs CAD.  [name] Obstructive sleep apnea    CPAP started successfully 2014  . Orthostatic hypotension    a. orthostatic. dehydration. hospitalized 11/11  . Sleep apnea    Significant obstructive sleep apnea diagnosed in August, 2012, the patient is to receive CPAP   . Stroke [facility])    "2 old strokes" CT and MRI. [place] [facility] 11/11. diagnosis was dehydration, no acutal reports.   . TSH elevation    on synthroid historically   . Unsteady gait    August, 2012    Past Surgical History:  Procedure Laterality Date  . BLADDER SURGERY    . CATARACT EXTRACTION W/PHACO Left 05/11/2015   Procedure: CATARACT EXTRACTION PHACO AND INTRAOCULAR LENS PLACEMENT (IOC);  Surgeon: Leandrew Koyanagi, MD;  Location: [place];  Service: Ophthalmology;  Laterality: Left;  DIABETIC - oral meds, CPAP  . COLONOSCOPY WITH PROPOFOL N/A  04/14/2019   Procedure: COLONOSCOPY WITH PROPOFOL;  Surgeon: [name], MD;  Location: [facility] ENDOSCOPY;  Service: Endoscopy;  Laterality: N/A;  . ESOPHAGOGASTRODUODENOSCOPY (EGD) WITH PROPOFOL N/A 07/01/2018   Procedure: ESOPHAGOGASTRODUODENOSCOPY (EGD) WITH PROPOFOL;  Surgeon: [name], MD;  Location: [facility] ENDOSCOPY;  Service: Endoscopy;  Laterality: N/A;  . INSERT / REPLACE / REMOVE PACEMAKER     Chemical engineer   . KIDNEY SURGERY     "froze mass"  . MECHANICAL AORTIC AND MITRAL VALVE REPLACEMENT  05/2018   [facility]   . TONSILLECTOMY    . VALVE REPLACEMENT       Current Outpatient Medications  Medication Sig Dispense Refill  . ALPRAZolam (XANAX) 0.25 MG tablet Take 1 tablet (0.25 mg total) by mouth every 6 (six) hours as needed for anxiety. 60 tablet 0  . amLODipine (NORVASC) 2.5 MG tablet Take 1 tablet (2.5 mg total) by  mouth daily. 90 tablet 3  . aspirin EC 81 MG tablet Take 1 tablet (81 mg total) by mouth daily.    [name] glucose blood (ONE TOUCH ULTRA TEST) test strip USE ONE STRIP TO CHECK GLUCOSE ONCE DAILY 100 each 12  . Lancets (ONETOUCH DELICA PLUS 123XX123) MISC USE 1  TO CHECK GLUCOSE ONCE DAILY 100 each 11  . latanoprost (XALATAN) 0.005 % ophthalmic solution Place 1 drop into both eyes at bedtime.     [name] levothyroxine (SYNTHROID) 50 MCG tablet Take 1 tablet (50 mcg total) by mouth daily. 90 tablet 3  . meclizine (ANTIVERT) 12.5 MG tablet Take 1 tablet (12.5 mg total) by mouth 3 (three) times daily as needed for dizziness. 90 tablet 3  . metFORMIN (GLUCOPHAGE) 1000 MG tablet Take 1 tablet by mouth once daily with breakfast 90 tablet 0  . metoprolol tartrate (LOPRESSOR) 25 MG tablet Take 1/2 (one-half) tablet by mouth twice daily 90 tablet 0  . ONETOUCH DELICA LANCETS 99991111 MISC USE ONE LANCET TO CHECK BLOOD GLUCOSE ONCE DAILY 100 each 12  . ONETOUCH ULTRA test strip USE 1 STRIP TO CHECK GLUCOSE ONCE DAILY 50 each 11  . pantoprazole (PROTONIX) 40 MG tablet Take 1 tablet (40 mg  total) by mouth 2 (two) times daily. 60 tablet 3  . simvastatin (ZOCOR) 40 MG tablet TAKE 1/2 (ONE-HALF) TABLET BY MOUTH AT BEDTIME 45 tablet 0  . warfarin (COUMADIN) 3 MG tablet TAKE 1 & 1/2  (ONE & ONE-HALF) TABLETS BY MOUTH DAILY EXCEPT 1 TABLET ON Monday AND Friday OR AS DIRECTED BY COUMADIN CLINIC. 45 tablet 3   No current facility-administered medications for this visit.    Allergies:   Macrolides and ketolides, Mycinettes, Nitrofuran derivatives, Nitrofurantoin, Erythromycin, Zofran [ondansetron hcl-dextrose], and Zofran [ondansetron hcl]    Social History:  The patient  reports that he quit smoking about 45 years ago. His smoking use included cigars and cigarettes. He has never used smokeless tobacco. He reports that he does not drink alcohol or use drugs.   Family History:  The patient's family history includes Arrhythmia in his brother and father; Breast cancer in his mother; Prostate cancer in his father; Stroke in his father.    ROS:  Please see the history of present illness.   Otherwise, review of systems are positive for none.   All other systems are reviewed and negative.    PHYSICAL EXAM: VS:  BP 128/60 (BP Location: Left Arm, Patient Position: Sitting, Cuff Size: Normal)   Pulse 79   Ht 5\' 9"  (1.753 m)   Wt 197 lb (89.4 kg)   SpO2 98%   BMI 29.09 kg/m  , BMI Body mass index is 29.09 kg/m. GEN: Well nourished, well developed, in no acute distress  HEENT: normal  Neck: no JVD, carotid bruits, or masses Cardiac: Regular rate and rhythm; no rubs, or gallops,no edema .  Normal mechanical heart sound. Respiratory:  clear to auscultation bilaterally, normal work of breathing GI: soft, nontender, nondistended, + BS MS: no deformity or atrophy  Skin: warm and dry, no rash Neuro:  Strength and sensation are intact Psych: euthymic mood, full affect   EKG:  EKG is ordered today. The ekg ordered today demonstrates ventricular paced rhythm with PVCs and underlying atrial  fibrillation.   Recent Labs: 07/30/2019: TSH 3.300 12/07/2019: ALT 21; BUN 23; Creatinine, Ser 1.11; Hemoglobin 13.3; Platelets 179; Potassium 4.3; Sodium 137    Lipid Panel    Component Value Date/Time  CHOL 142 07/30/2019 0956   TRIG 97 07/30/2019 0956   HDL 39 (L) 07/30/2019 0956   CHOLHDL 3.6 07/30/2019 0956   LDLCALC 85 07/30/2019 0956      Wt Readings from Last 3 Encounters:  12/10/19 197 lb (89.4 kg)  12/09/19 200 lb (90.7 kg)  08/26/19 204 lb (92.5 kg)        ASSESSMENT AND PLAN:   1.  Hypertrophic obstructive cardiomyopathy: Status post septal myomectomy at [facility] in 2019 with mechanical mitral and aortic valve replacements.  Doing well overall with no symptoms of heart failure.  2.  Valvular heart disease: Status post mechanical mitral and aortic valve replacements.  He is chronically anticoagulated with Coumadin and followed closely in our Coumadin clinic.  Normal functioning valves on echo in October 2019.  Continue low-dose aspirin as well.  3.  Complete heart block: This developed following septal myomectomy.  He is status post single lead Product/process development scientist and this is followed at [facility].  4.  Permanent atrial fibrillation: Rate controlled on beta-blocker therapy.  Anticoagulated with Coumadin.  5.  Essential hypertension: Blood pressure is now well controlled after the addition of small dose amlodipine.  5.  Nonobstructive CAD: Minimal CAD on catheterization in August 2019.  Continue aspirin, beta-blocker, and statin therapy.  7.  Hyperlipidemia: He remains on statin therapy.    Disposition:   FU with me in 6 months  Signed,  Kathlyn Sacramento, MD  12/10/2019 2:29 PM    [place]

## 2019-12-10 NOTE — Patient Instructions (Signed)

## 2019-12-11 NOTE — Progress Notes (Signed)
Patient: [name] [name] Male    DOB: 1933/12/15   86 y.o.   MRN: [id number] Visit Date: 12/14/2019  Today's Provider: [name], MD   Chief Complaint  Patient presents with  . Follow-up  . Diabetes  . Hypertension  . Hyperlipidemia  . Hypothyroidism  . Coronary Artery Disease   Subjective:     HPI  Overall he is feeling pretty well.  Slowly getting strength back from multiple medical issues in the past couple of years.  He saw Dr. Velva Harman from cardiology a week ago and Dr. B from oncology a week ago.  He had his nails trimmed by Dr. Cleda Mccreedy.  He still lives independently at home with his wife.  He has had his first Covid shot. Type 2 diabetes mellitus without complication, without long-term current use of insulin (HCC) From 07/29/2019-Goal A1c less than 8. HgB A1C 6.7.  Adult hypothyroidism From 07/29/2019-labs checked showing-stable.   Hyperlipidemia, unspecified hyperlipidemia type From 07/29/2019-Goal LDL less than 70. Labs checked showing-stable.  Hypertension, unspecified type From 07/29/2019-Controlled.  Coronary artery disease without angina pectoris, unspecified vessel or lesion type, unspecified whether native or transplanted heart From 07/29/2019-All risk factors treated.  Mediastinal lymphadenopathy From 07/29/2019-Followed by oncology.  This appears to be benign at this time.  HOCM (hypertrophic obstructive cardiomyopathy) ([place]) From 07/29/2019-Status post myomectomy.  This was done at [facility].  Unsteady gait From 07/29/2019-Advised patient to continue to walk on flat ground and remain as active as possible.   Allergies  Allergen Reactions  . Macrolides And Ketolides Other (See Comments)  . Mycinettes   . Nitrofuran Derivatives Other (See Comments)  . Nitrofurantoin Other (See Comments)  . Erythromycin Itching and Rash    Other reaction(s): UNKNOWN  And red skin  . Zofran [Ondansetron Hcl-Dextrose] Rash  . Zofran [Ondansetron Hcl]  Rash     Current Outpatient Medications:  .  ALPRAZolam (XANAX) 0.25 MG tablet, Take 1 tablet (0.25 mg total) by mouth every 6 (six) hours as needed for anxiety., Disp: 60 tablet, Rfl: 0 .  amLODipine (NORVASC) 2.5 MG tablet, Take 1 tablet (2.5 mg total) by mouth daily., Disp: 90 tablet, Rfl: 3 .  aspirin EC 81 MG tablet, Take 1 tablet (81 mg total) by mouth daily., Disp: , Rfl:  .  glucose blood (ONE TOUCH ULTRA TEST) test strip, USE ONE STRIP TO CHECK GLUCOSE ONCE DAILY, Disp: 100 each, Rfl: 12 .  Lancets (ONETOUCH DELICA PLUS 123XX123) MISC, USE 1  TO CHECK GLUCOSE ONCE DAILY, Disp: 100 each, Rfl: 11 .  latanoprost (XALATAN) 0.005 % ophthalmic solution, Place 1 drop into both eyes at bedtime. , Disp: , Rfl:  .  levothyroxine (SYNTHROID) 50 MCG tablet, Take 1 tablet (50 mcg total) by mouth daily., Disp: 90 tablet, Rfl: 3 .  meclizine (ANTIVERT) 12.5 MG tablet, Take 1 tablet (12.5 mg total) by mouth 3 (three) times daily as needed for dizziness., Disp: 90 tablet, Rfl: 3 .  metFORMIN (GLUCOPHAGE) 1000 MG tablet, Take 1 tablet by mouth once daily with breakfast, Disp: 90 tablet, Rfl: 0 .  metoprolol tartrate (LOPRESSOR) 25 MG tablet, Take 1/2 (one-half) tablet by mouth twice daily, Disp: 90 tablet, Rfl: 0 .  ONETOUCH DELICA LANCETS 99991111 MISC, USE ONE LANCET TO CHECK BLOOD GLUCOSE ONCE DAILY, Disp: 100 each, Rfl: 12 .  ONETOUCH ULTRA test strip, USE 1 STRIP TO CHECK GLUCOSE ONCE DAILY, Disp: 50 each, Rfl: 11 .  pantoprazole (PROTONIX) 40 MG tablet,  Take 1 tablet (40 mg total) by mouth 2 (two) times daily. (Patient taking differently: Take 40 mg by mouth daily. ), Disp: 60 tablet, Rfl: 3 .  simvastatin (ZOCOR) 40 MG tablet, TAKE 1/2 (ONE-HALF) TABLET BY MOUTH AT BEDTIME, Disp: 45 tablet, Rfl: 0 .  warfarin (COUMADIN) 3 MG tablet, TAKE 1 & 1/2  (ONE & ONE-HALF) TABLETS BY MOUTH DAILY EXCEPT 1 TABLET ON Monday AND Friday OR AS DIRECTED BY COUMADIN CLINIC., Disp: 45 tablet, Rfl: 3  Review of Systems   Constitutional: Negative for appetite change, chills and fever.  HENT: Negative.   Eyes: Negative.   Respiratory: Negative for chest tightness, shortness of breath and wheezing.   Cardiovascular: Negative for chest pain and palpitations.  Gastrointestinal: Negative for abdominal pain, nausea and vomiting.  Endocrine: Negative.   Musculoskeletal: Positive for arthralgias.  Allergic/Immunologic: Negative.   Hematological: Negative.   Psychiatric/Behavioral: Negative.     Social History   Tobacco Use  . Smoking status: Former Smoker    Types: Cigars, Cigarettes    Quit date: 10/02/1974    Years since quitting: 45.2  . Smokeless tobacco: Never Used  . Tobacco comment: still smokes cigars once a day  Substance Use Topics  . Alcohol use: No      Objective:   BP 115/66 (BP Location: Left Arm, Patient Position: Sitting, Cuff Size: Large)   Pulse 72   Temp (!) 97.3 F (36.3 C) (Other (Comment))   Resp 18   Ht 5\' 7"  (1.702 m)   Wt 195 lb (88.5 kg)   SpO2 96%   BMI 30.54 kg/m  Vitals:   12/14/19 0854  BP: 115/66  Pulse: 72  Resp: 18  Temp: (!) 97.3 F (36.3 C)  TempSrc: Other (Comment)  SpO2: 96%  Weight: 195 lb (88.5 kg)  Height: 5\' 7"  (1.702 m)  Body mass index is 30.54 kg/m.   Physical Exam Vitals reviewed.  Constitutional:      Appearance: He is well-developed.  HENT:     Head: Normocephalic and atraumatic.     Right Ear: External ear normal.     Left Ear: External ear normal.     Nose: Nose normal.  Eyes:     General: No scleral icterus.    Conjunctiva/sclera: Conjunctivae normal.  Neck:     Thyroid: No thyromegaly.     Vascular: No JVD.  Cardiovascular:     Rate and Rhythm: Normal rate and regular rhythm.     Heart sounds: Normal heart sounds.  Pulmonary:     Effort: Pulmonary effort is normal.     Breath sounds: Normal breath sounds.  Abdominal:     Palpations: Abdomen is soft.  Musculoskeletal:        General: No swelling.     Right lower  leg: No edema.     Left lower leg: No edema.  Skin:    General: Skin is warm and dry.  Neurological:     General: No focal deficit present.     Mental Status: He is alert and oriented to person, place, and time. Mental status is at baseline.  Psychiatric:        Mood and Affect: Mood normal.        Behavior: Behavior normal.        Thought Content: Thought content normal.        Judgment: Judgment normal.      No results found for any visits on 12/14/19.     Assessment &  Plan    1. Type 2 diabetes mellitus without complication, without long-term current use of insulin (HCC) Good control with A1c of 6.6.  No changes in care.  He is tolerating Metformin.  Return in 6 months. - POCT glycosylated hemoglobin (Hb A1C)  2. Hypertension, unspecified type On amlodipine, metoprolol.  3. Coronary artery disease without angina pectoris, unspecified vessel or lesion type, unspecified whether native or transplanted heart All risk factors treated  4. Mediastinal lymphadenopathy Oncology following patient.  Probably a very low level lymphoma versus reactive process.  5. HOCM (hypertrophic obstructive cardiomyopathy) ([place]) Status post myomectomy  6. Heart block AV third degree ([place])   7. Pacemaker Pacemaker for heart block.  8. Atrial fibrillation and flutter ([place]) On Coumadin daily     Follow up in 6 months.      I,Daneesha Quinteros,acting as a scribe for [name], MD.,have documented all relevant documentation on the behalf of [name], MD,as directed by  [name], MD while in the presence of [name], MD.      [name], MD  [facility]

## 2019-12-14 ENCOUNTER — Encounter: Payer: Self-pay | Admitting: Family Medicine

## 2019-12-14 ENCOUNTER — Ambulatory Visit (INDEPENDENT_AMBULATORY_CARE_PROVIDER_SITE_OTHER): Payer: PPO | Admitting: Family Medicine

## 2019-12-14 ENCOUNTER — Other Ambulatory Visit: Payer: Self-pay

## 2019-12-14 VITALS — BP 115/66 | HR 72 | Temp 97.3°F | Resp 18 | Ht 67.0 in | Wt 195.0 lb

## 2019-12-14 DIAGNOSIS — I4892 Unspecified atrial flutter: Secondary | ICD-10-CM

## 2019-12-14 DIAGNOSIS — R590 Localized enlarged lymph nodes: Secondary | ICD-10-CM | POA: Diagnosis not present

## 2019-12-14 DIAGNOSIS — I442 Atrioventricular block, complete: Secondary | ICD-10-CM | POA: Diagnosis not present

## 2019-12-14 DIAGNOSIS — I4891 Unspecified atrial fibrillation: Secondary | ICD-10-CM

## 2019-12-14 DIAGNOSIS — Z950 Presence of cardiac pacemaker: Secondary | ICD-10-CM | POA: Diagnosis not present

## 2019-12-14 DIAGNOSIS — I10 Essential (primary) hypertension: Secondary | ICD-10-CM | POA: Diagnosis not present

## 2019-12-14 DIAGNOSIS — I2510 Atherosclerotic heart disease of native coronary artery without angina pectoris: Secondary | ICD-10-CM | POA: Diagnosis not present

## 2019-12-14 DIAGNOSIS — I421 Obstructive hypertrophic cardiomyopathy: Secondary | ICD-10-CM

## 2019-12-14 DIAGNOSIS — E119 Type 2 diabetes mellitus without complications: Secondary | ICD-10-CM | POA: Diagnosis not present

## 2019-12-14 LAB — POCT GLYCOSYLATED HEMOGLOBIN (HGB A1C)
Est. average glucose Bld gHb Est-mCnc: 143
Hemoglobin A1C: 6.6 % — AB (ref 4.0–5.6)

## 2019-12-16 ENCOUNTER — Other Ambulatory Visit: Payer: Self-pay

## 2019-12-16 ENCOUNTER — Ambulatory Visit: Payer: PPO

## 2019-12-16 DIAGNOSIS — I482 Chronic atrial fibrillation, unspecified: Secondary | ICD-10-CM

## 2019-12-16 DIAGNOSIS — Z5181 Encounter for therapeutic drug level monitoring: Secondary | ICD-10-CM | POA: Diagnosis not present

## 2019-12-16 DIAGNOSIS — Z950 Presence of cardiac pacemaker: Secondary | ICD-10-CM | POA: Diagnosis not present

## 2019-12-16 DIAGNOSIS — Z952 Presence of prosthetic heart valve: Secondary | ICD-10-CM | POA: Diagnosis not present

## 2019-12-16 DIAGNOSIS — I4891 Unspecified atrial fibrillation: Secondary | ICD-10-CM

## 2019-12-16 DIAGNOSIS — I4892 Unspecified atrial flutter: Secondary | ICD-10-CM | POA: Diagnosis not present

## 2019-12-16 LAB — POCT INR: INR: 3.2 — AB (ref 2.0–3.0)

## 2019-12-16 NOTE — Patient Instructions (Signed)
-   continue Warfarin 1.5 tablets every day EXCEPT 1 tablet on [place].  - recheck INR in 4 weeks.

## 2019-12-17 DIAGNOSIS — G4733 Obstructive sleep apnea (adult) (pediatric): Secondary | ICD-10-CM | POA: Diagnosis not present

## 2019-12-18 DIAGNOSIS — H35371 Puckering of macula, right eye: Secondary | ICD-10-CM | POA: Diagnosis not present

## 2019-12-18 DIAGNOSIS — H401131 Primary open-angle glaucoma, bilateral, mild stage: Secondary | ICD-10-CM | POA: Diagnosis not present

## 2019-12-18 LAB — HM DIABETES EYE EXAM

## 2019-12-21 ENCOUNTER — Other Ambulatory Visit: Payer: Self-pay | Admitting: Cardiovascular Disease

## 2019-12-28 ENCOUNTER — Other Ambulatory Visit: Payer: Self-pay | Admitting: Family Medicine

## 2019-12-29 ENCOUNTER — Ambulatory Visit: Payer: PPO | Attending: Internal Medicine

## 2019-12-29 DIAGNOSIS — Z23 Encounter for immunization: Secondary | ICD-10-CM

## 2019-12-29 NOTE — Progress Notes (Signed)
   Covid-19 Vaccination Clinic  Name:  [name] [name]    MRN: [id number] DOB: [birth year]  12/29/2019  [name] was observed post Covid-19 immunization for 15 minutes without incident. He was provided with Vaccine Information Sheet and instruction to access the V-Safe system.   [name] was instructed to call 911 with any severe reactions post vaccine: [name] Difficulty breathing  . Swelling of face and throat  . A fast heartbeat  . A bad rash all over body  . Dizziness and weakness   Immunizations Administered    Name Date Dose VIS Date Route   Pfizer COVID-19 Vaccine 12/29/2019 11:03 AM 0.3 mL 09/25/2019 Intramuscular   Manufacturer: [place]   Lot: [id number]   [place]: [id number]

## 2020-01-04 ENCOUNTER — Other Ambulatory Visit: Payer: Self-pay

## 2020-01-04 ENCOUNTER — Ambulatory Visit: Payer: PPO

## 2020-01-04 DIAGNOSIS — Z5181 Encounter for therapeutic drug level monitoring: Secondary | ICD-10-CM

## 2020-01-04 DIAGNOSIS — I4891 Unspecified atrial fibrillation: Secondary | ICD-10-CM

## 2020-01-04 DIAGNOSIS — I4892 Unspecified atrial flutter: Secondary | ICD-10-CM

## 2020-01-04 DIAGNOSIS — Z950 Presence of cardiac pacemaker: Secondary | ICD-10-CM

## 2020-01-04 DIAGNOSIS — I482 Chronic atrial fibrillation, unspecified: Secondary | ICD-10-CM

## 2020-01-04 DIAGNOSIS — Z952 Presence of prosthetic heart valve: Secondary | ICD-10-CM

## 2020-01-04 LAB — POCT INR: INR: 3 (ref 2.0–3.0)

## 2020-01-04 NOTE — Patient Instructions (Signed)
-   continue Warfarin 1.5 tablets every day EXCEPT 1 tablet on [place].  - recheck INR in 6 weeks.

## 2020-01-12 ENCOUNTER — Other Ambulatory Visit: Payer: Self-pay | Admitting: Cardiovascular Disease

## 2020-01-12 MED ORDER — METOPROLOL TARTRATE 25 MG PO TABS: ORAL_TABLET | ORAL | 0 refills | Status: DC

## 2020-01-12 NOTE — Telephone Encounter (Signed)
Requested Prescriptions   Signed Prescriptions Disp Refills  . metoprolol tartrate (LOPRESSOR) 25 MG tablet 90 tablet 0    Sig: Take 1/2 (one-half) tablet by mouth twice daily    Authorizing Provider: Kathlyn Sacramento A    Ordering User: Britt Bottom

## 2020-01-12 NOTE — Telephone Encounter (Signed)
*  STAT* If patient is at the pharmacy, call can be transferred to refill team.   1. Which medications need to be refilled? (please list name of each medication and dose if known) metoprolol tertrate 25mg   2. Which pharmacy/location (including street and city if local pharmacy) is medication to be sent to? [facility] in [place] [phone]  3. Do they need a 30 day or 90 day supply? [place]

## 2020-01-19 NOTE — Progress Notes (Signed)
Subjective:   [name] [name] is a 86 y.o. male who presents for Medicare Annual/Subsequent preventive examination.    This visit is being conducted through telemedicine due to the COVID-19 pandemic. This patient has given me verbal consent via doximity to conduct this visit, patient states they are participating from their home address. Some vital signs may be absent or patient reported.    Patient identification: identified by name, DOB, and current address  Review of Systems:  N/A  Cardiac Risk Factors include: advanced age (>4men, >69 women);diabetes mellitus;dyslipidemia;male gender;hypertension     Objective:    Vitals: There were no vitals taken for this visit.  There is no height or weight on file to calculate BMI. Unable to obtain vitals due to visit being conducted via telephonically.   Advanced Directives 01/20/2020 06/10/2019 01/19/2019 12/01/2018 09/22/2018 07/08/2018 06/30/2018  Does Patient Have a Medical Advance Directive? Yes No Yes Yes Yes Yes Yes  Type of Paramedic of [place];Living will - [place];Living will [place];Living will Healthcare Power of [place];Living will Living will  Does patient want to make changes to medical advance directive? - - - No - Patient declined No - Patient declined No - Patient declined No - Patient declined  Copy of [place] in Chart? Yes - validated most recent copy scanned in chart (See row information) - Yes - validated most recent copy scanned in chart (See row information) Yes - validated most recent copy scanned in chart (See row information) No - copy requested Yes -  Would patient like information on creating a medical advance directive? - - - - - - -    Tobacco Social History   Tobacco Use  Smoking Status Former Smoker  . Types: Cigars, Cigarettes  . Quit date: 10/02/1974  . Years since quitting: 45.3  Smokeless  Tobacco Never Used  Tobacco Comment   still smokes cigars once a day     Counseling given: Not Answered Comment: still smokes cigars once a day   Clinical Intake:  Pre-visit preparation completed: Yes  Pain : No/denies pain Pain Score: 0-No pain     Nutritional Risks: None Diabetes: Yes  How often do you need to have someone help you when you read instructions, pamphlets, or other written materials from your doctor or pharmacy?: 1 - Never   Diabetes:  Is the patient diabetic?  Yes  If diabetic, was a CBG obtained today?  No  Did the patient bring in their glucometer from home?  No  How often do you monitor your CBG's? Once a day in the morning.   Financial Strains and Diabetes Management:  Are you having any financial strains with the device, your supplies or your medication? No .  Does the patient want to be seen by Chronic Care Management for management of their diabetes?  No  Would the patient like to be referred to a Nutritionist or for Diabetic Management?  No   Diabetic Exams:  Diabetic Eye Exam: Completed 12/18/19. Repeat yearly.   Diabetic Foot Exam: Completed 11/12/18. Pt advised about the importance of completing this yearly. Note made to follow up on this at next in office apt.   Interpreter Needed?: No  Information entered by :: [facility], LPN  Past Medical History:  Diagnosis Date  . Aortic stenosis    a. 01/2016 echo: mod AS; b. 04/2018 Cardiac MRI: sev AS; c. 05/2018 s/p mech AVR @  time of myomectomy; d. 07/2018 Echo: Mild AS/AI.  [name] Atherosclerosis of aorta ([place])    by CT scan in past  . Atrial fibrillation ([place])    and aflutter. pt has a left atrial circuit that is not ablated. was on amiodarone-stopped, now use rate control.   . Bladder cancer ([place])    hx; treated with BCG in past   . Carotid artery disease ([place])    There was calcified plaque but no stenosis by carotid artery screening done at [facility] October, 2013  . CHB  (complete heart block) ([place])    a. 05/2018 s/p BSX VVIR PPM ([facility]) in setting of bradycardia following myomectomy and AVR/MVR.  . Diabetes mellitus    type not specified  . Diabetic neuropathy (HCC)    feet  . Elevated liver enzymes    over time; hx  . Excessive sweating   . Glaucoma   . Gout   . Head injury    when slipped on ice 2004-2005. stabilized and back on Coumadin   . Headache    migraines - distant past  . History of cardiac cath    a. 05/2018 Cath ([facility]): Non-obstructive CAD. Mildly elevated filling pressures w/ nl CI.  [name] HOCM (hypertrophic obstructive cardiomyopathy) ([place])    a. 01/2016 Echo: EF 65-70%, no rwma, LVOT gradient of 80-49mmHg, mod AS, SAM; b. 11/2017 Echo: EF 55-60%, near cavity obliteration in systole, mod AS, mild MS; c. 04/2018 Card MRI ([facility]): Sev LVH, EF >70%, Sev AS, LVOT obs w/ Sev MR, mild to mod TR/PR, mid-myocardial HE in basal-mid anteroseptum and inferoseptum; d. 05/2018 s/p Septal myomectomy; e. 07/2018 Echo: EF 60-65%. PASP 60mmHg.  [name] Homocystinemia ([place])    elevated, mild   . Hypercholesterolemia    treated.   . Hypertension   . Infection of right inner ear   . Kidney mass    a. s/p laproscopic surgery woth cryoablation of a mass outside kidney followed at [facility]; b. 11/2018 CT Chest: 2cm R kidney mass.  . Mediastinal adenopathy    a. 12/208 CT Chest: up to 83mm LLL lung nodule. 1.5-1.7cm subcarinal/mediastinal adenopahty/right paratracheal lymph node; b. 11/2018 CT Chest: 2.5-3cm mediastinal adenopathy.  . Mitral regurgitation    a. 04/2018 Cardiac MRI: sev MR; b. 05/2018 s/p mech MVR @ time of myomectomy; c. 07/2018 Echo: Mild MS.  . Motion sickness    ocean boats  . Nausea   . Nonobstructive coronary artery disease    a. 05/2018 Cath ([facility]): nonobs CAD.  [name] Obstructive sleep apnea    CPAP started successfully 2014  . Orthostatic hypotension    a. orthostatic. dehydration. hospitalized 11/11  . Sleep apnea    Significant obstructive sleep apnea  diagnosed in August, 2012, the patient is to receive CPAP   . Stroke [facility])    "2 old strokes" CT and MRI. [place] 11/11. diagnosis was dehydration, no acutal reports.   . TSH elevation    on synthroid historically   . Unsteady gait    August, 2012   Past Surgical History:  Procedure Laterality Date  . BLADDER SURGERY    . CATARACT EXTRACTION W/PHACO Left 05/11/2015   Procedure: CATARACT EXTRACTION PHACO AND INTRAOCULAR LENS PLACEMENT (IOC);  Surgeon: Leandrew Koyanagi, MD;  Location: [facility];  Service: Ophthalmology;  Laterality: Left;  DIABETIC - oral meds, CPAP  . COLONOSCOPY WITH PROPOFOL N/A 04/14/2019   Procedure: COLONOSCOPY WITH PROPOFOL;  Surgeon: [name], MD;  Location: [facility] ENDOSCOPY;  Service: Endoscopy;  Laterality: N/A;  . ESOPHAGOGASTRODUODENOSCOPY (EGD) WITH PROPOFOL N/A 07/01/2018   Procedure: ESOPHAGOGASTRODUODENOSCOPY (EGD) WITH PROPOFOL;  Surgeon: [name], MD;  Location: [facility] ENDOSCOPY;  Service: Endoscopy;  Laterality: N/A;  . INSERT / REPLACE / REMOVE PACEMAKER     Chemical engineer   . KIDNEY SURGERY     "froze mass"  . MECHANICAL AORTIC AND MITRAL VALVE REPLACEMENT  05/2018   [facility]   . TONSILLECTOMY    . VALVE REPLACEMENT     Family History  Problem Relation Age of Onset  . Arrhythmia Father        A-Fib  . Prostate cancer Father   . Stroke Father   . Breast cancer Mother   . Arrhythmia Brother        A-Fib  . Heart attack Neg Hx   . Hypertension Neg Hx    Social History   Socioeconomic History  . Marital status: Married    Spouse name: Jasmine Awe  . Number of children: 1  . Years of education: 7  . Highest education level: Bachelor's degree (e.g., BA, AB, BS)  Occupational History  . Occupation: retired  Tobacco Use  . Smoking status: Former Smoker    Types: Cigars, Cigarettes    Quit date: 10/02/1974    Years since quitting: 45.3  . Smokeless tobacco: Never Used  . Tobacco comment: still smokes cigars once a  day  Substance and Sexual Activity  . Alcohol use: No  . Drug use: No  . Sexual activity: Yes  Other Topics Concern  . Not on file  Social History Narrative   Married, retired, gets regular exercise.    Social Determinants of Health   Financial Resource Strain: Low Risk   . Difficulty of Paying Living Expenses: Not hard at all  Food Insecurity: No Food Insecurity  . Worried About Charity fundraiser in the Last Year: Never true  . Ran Out of Food in the Last Year: Never true  Transportation Needs: No Transportation Needs  . Lack of Transportation (Medical): No  . Lack of Transportation (Non-Medical): No  Physical Activity: Inactive  . Days of Exercise per Week: 0 days  . Minutes of Exercise per Session: 0 min  Stress: No Stress Concern Present  . Feeling of Stress : Not at all  Social Connections: Somewhat Isolated  . Frequency of Communication with Friends and Family: More than three times a week  . Frequency of Social Gatherings with Friends and Family: More than three times a week  . Attends Religious Services: Never  . Active Member of Clubs or Organizations: No  . Attends Archivist Meetings: Never  . Marital Status: Married    Outpatient Encounter Medications as of 01/20/2020  Medication Sig  . ALPRAZolam (XANAX) 0.25 MG tablet Take 1 tablet (0.25 mg total) by mouth every 6 (six) hours as needed for anxiety.  [name] amLODipine (NORVASC) 2.5 MG tablet Take 1 tablet (2.5 mg total) by mouth daily.  [name] aspirin EC 81 MG tablet Take 1 tablet (81 mg total) by mouth daily.  [name] glucose blood (ONE TOUCH ULTRA TEST) test strip USE ONE STRIP TO CHECK GLUCOSE ONCE DAILY  . Lancets (ONETOUCH DELICA PLUS 123XX123) MISC USE 1  TO CHECK GLUCOSE ONCE DAILY  . latanoprost (XALATAN) 0.005 % ophthalmic solution Place 1 drop into both eyes at bedtime.   [name] levothyroxine (SYNTHROID) 50 MCG tablet Take 1 tablet (50 mcg total) by mouth daily.  . meclizine (  ANTIVERT) 12.5 MG tablet Take 1  tablet (12.5 mg total) by mouth 3 (three) times daily as needed for dizziness. (Patient taking differently: Take 12.5 mg by mouth 3 (three) times daily as needed for dizziness. takes 1/2 tab every morning)  . metFORMIN (GLUCOPHAGE) 1000 MG tablet Take 1 tablet by mouth once daily with breakfast  . metoprolol tartrate (LOPRESSOR) 25 MG tablet Take 1/2 (one-half) tablet by mouth twice daily  . ONETOUCH DELICA LANCETS 99991111 MISC USE ONE LANCET TO CHECK BLOOD GLUCOSE ONCE DAILY  . ONETOUCH ULTRA test strip USE 1 STRIP TO CHECK GLUCOSE ONCE DAILY  . pantoprazole (PROTONIX) 40 MG tablet Take 1 tablet (40 mg total) by mouth 2 (two) times daily. (Patient taking differently: Take 40 mg by mouth daily. )  . simvastatin (ZOCOR) 40 MG tablet TAKE 1/2 (ONE-HALF) TABLET BY MOUTH AT BEDTIME  . warfarin (COUMADIN) 3 MG tablet TAKE 1 & 1/2  (ONE & ONE-HALF) TABLETS BY MOUTH DAILY EXCEPT 1 TABLET ON Monday AND Friday OR AS DIRECTED BY COUMADIN CLINIC.   No facility-administered encounter medications on file as of 01/20/2020.    Activities of Daily Living In your present state of health, do you have any difficulty performing the following activities: 01/20/2020  Hearing? Y  Comment Wears bilateral hearing aids.  Vision? N  Difficulty concentrating or making decisions? N  Walking or climbing stairs? N  Dressing or bathing? N  Doing errands, shopping? Y  Comment Does not drive currently due to balance issues.  Preparing Food and eating ? N  Using the Toilet? N  In the past six months, have you accidently leaked urine? N  Do you have problems with loss of bowel control? N  Managing your Medications? N  Managing your Finances? N  Housekeeping or managing your Housekeeping? N  Some recent data might be hidden    Patient Care Team: [name], MD as PCP - General (Unknown Physician Specialty) Wellington Hampshire, MD as PCP - Cardiology (Cardiology) Margaretha Sheffield, MD (Otolaryngology) Leandrew Koyanagi, MD as Referring Physician (Ophthalmology) Sharlotte Alamo, DPM as Consulting Physician (Podiatry) Bernardo Heater, Ronda Fairly, MD as Consulting Physician (Urology) Jannet Mantis, MD as Consulting Physician (Dermatology) Telford Nab, RN as Registered Nurse [name], MD as Consulting Physician (Gastroenterology) [place], Deanne Coffer, PA-C (Cardiology) Neldon Labella, RN as Case Manager   Assessment:   This is a routine wellness examination for [name] [name].  Exercise Activities and Dietary recommendations Current Exercise Habits: Home exercise routine, Type of exercise: stretching, Time (Minutes): 10, Frequency (Times/Week): 7, Weekly Exercise (Minutes/Week): 70, Intensity: Mild, Exercise limited by: None identified  Goals    . DIET - INCREASE WATER INTAKE     Recommend increasing water intake to 6-8 glasses a day and cutting back on diet sodas.        Fall Risk Fall Risk  01/20/2020 01/19/2019 09/22/2018 07/08/2018 11/11/2017  Falls in the past year? 0 0 0 No Yes  Number falls in past yr: 0 - - - 1  Injury with Fall? 0 - - - No  Comment - - - - bruising  Risk Factor Category  - - - - -  Follow up - - - - Falls prevention discussed   FALL RISK PREVENTION PERTAINING TO THE HOME:  Any stairs in or around the home? Yes  If so, are there any without handrails? No   Home free of loose throw rugs in walkways, pet beds, electrical cords, etc? Yes  Adequate  lighting in your home to reduce risk of falls? Yes   ASSISTIVE DEVICES UTILIZED TO PREVENT FALLS:  Life alert? No  Use of a cane, walker or w/c? Yes  Grab bars in the bathroom? Yes  Shower chair or bench in shower? Yes  Elevated toilet seat or a handicapped toilet? No    TIMED UP AND GO:  Was the test performed? No .    Depression Screen PHQ 2/9 Scores 04/01/2019 01/19/2019 09/22/2018 07/08/2018  PHQ - 2 Score 0 0 0 0  PHQ- 9 Score 3 - 1 -    Cognitive Function MMSE - Mini Mental State Exam 04/01/2019 11/11/2017   Orientation to time 5 4  Orientation to Place 5 5  Registration 3 3  Attention/ Calculation 5 5  Recall 3 0  Language- name 2 objects 2 2  Language- repeat 1 1  Language- follow 3 step command 2 3  Language- read & follow direction 1 1  Write a sentence 1 1  Copy design 1 1  Total score 29 26     6CIT Screen 01/20/2020 01/19/2019 11/11/2017 10/30/2016  What Year? 0 points 0 points 0 points 0 points  What month? 0 points 0 points 0 points 0 points  What time? 0 points 0 points 0 points 0 points  Count back from 20 0 points 0 points 0 points 0 points  Months in reverse 0 points 0 points 2 points 0 points  Repeat phrase 0 points 0 points 2 points 0 points  Total Score 0 0 4 0    Immunization History  Administered Date(s) Administered  . Fluad Quad(high Dose 65+) 06/12/2019  . Influenza, High Dose Seasonal PF 07/17/2017, 07/05/2018  . Influenza-Unspecified 08/15/2013  . PFIZER SARS-COV-2 Vaccination 11/30/2019, 12/29/2019  . Pneumococcal Conjugate-13 04/08/2014  . Pneumococcal Polysaccharide-23 08/07/2003  . Td 11/23/2009  . Zoster 12/27/2008  . Zoster Recombinat (Shingrix) 05/06/2019, 07/30/2019    Qualifies for Shingles Vaccine? Completed series  Tdap: Although this vaccine is not a covered service during a Wellness Exam, does the patient still wish to receive this vaccine today?  No . Advised may receive this vaccine at local pharmacy or Health Dept. Aware to provide a copy of the vaccination record if obtained from local pharmacy or Health Dept. Verbalized acceptance and understanding.  Flu Vaccine: Up to date  Pneumococcal Vaccine: Completed series  Screening Tests Health Maintenance  Topic Date Due  . URINE MICROALBUMIN  10/30/2017  . FOOT EXAM  11/13/2019  . TETANUS/TDAP  11/24/2019  . INFLUENZA VACCINE  05/15/2020  . HEMOGLOBIN A1C  06/15/2020  . OPHTHALMOLOGY EXAM  12/17/2020  . PNA vac Low Risk Adult  Completed   Cancer Screenings:  Colorectal Screening: No  longer required.   Lung Cancer Screening: (Low Dose CT Chest recommended if Age 51-80 years, 30 pack-year currently smoking OR have quit w/in 15years.) does not qualify.   Additional Screening:  Dental Screening: Recommended annual dental exams for proper oral hygiene  Community Resource Referral:  CRR required this visit?  No        Plan:  I have personally reviewed and addressed the Medicare Annual Wellness questionnaire and have noted the following in the patient's chart:  A. Medical and social history B. Use of alcohol, tobacco or illicit drugs  C. Current medications and supplements D. Functional ability and status E.  Nutritional status F.  Physical activity G. Advance directives H. List of other physicians I.  Hospitalizations, surgeries, and ER visits  in previous 12 months J.  [place] such as hearing and vision if needed, cognitive and depression L. Referrals and appointments   In addition, I have reviewed and discussed with patient certain preventive protocols, quality metrics, and best practice recommendations. A written personalized care plan for preventive services as well as general preventive health recommendations were provided to patient.   [place]ora Score, [place]  [id number] Nurse Health Advisor  Nurse Notes: Pt needs a urine check and diabetic foot exam at next in office visit.

## 2020-01-20 ENCOUNTER — Other Ambulatory Visit: Payer: Self-pay

## 2020-01-20 ENCOUNTER — Ambulatory Visit (INDEPENDENT_AMBULATORY_CARE_PROVIDER_SITE_OTHER): Payer: PPO

## 2020-01-20 DIAGNOSIS — Z0000 Encounter for general adult medical examination without abnormal findings: Secondary | ICD-10-CM

## 2020-01-20 NOTE — Patient Instructions (Signed)
[name] [name] , Thank you for taking time to come for your Medicare Wellness Visit. I appreciate your ongoing commitment to your health goals. Please review the following plan we discussed and let me know if I can assist you in the future.   Screening recommendations/referrals: Colonoscopy: No longer required.  Recommended yearly ophthalmology/optometry visit for glaucoma screening and checkup Recommended yearly dental visit for hygiene and checkup  Vaccinations: Influenza vaccine: Up to date Pneumococcal vaccine: Completed series Tdap vaccine: Pt declines today.  Shingles vaccine: Completed series    Advanced directives: Currently on file.  Conditions/risks identified: Recommend to increase water intake to 6-8 8 oz glasses a day.   Next appointment: 06/15/20 @ 10:40 AM with Dr Rosanna Randy   Preventive Care 25 Years and Older, Male Preventive care refers to lifestyle choices and visits with your health care provider that can promote health and wellness. What does preventive care include?  A yearly physical exam. This is also called an annual well check.  Dental exams once or twice a year.  Routine eye exams. Ask your health care provider how often you should have your eyes checked.  Personal lifestyle choices, including:  Daily care of your teeth and gums.  Regular physical activity.  Eating a healthy diet.  Avoiding tobacco and drug use.  Limiting alcohol use.  Practicing safe sex.  Taking low doses of aspirin every day.  Taking vitamin and mineral supplements as recommended by your health care provider. What happens during an annual well check? The services and screenings done by your health care provider during your annual well check will depend on your age, overall health, lifestyle risk factors, and family history of disease. Counseling  Your health care provider may ask you questions about your:  Alcohol use.  Tobacco use.  Drug use.  Emotional well-being.   Home and relationship well-being.  Sexual activity.  Eating habits.  History of falls.  Memory and ability to understand (cognition).  Work and work Statistician. Screening  You may have the following tests or measurements:  Height, weight, and BMI.  Blood pressure.  Lipid and cholesterol levels. These may be checked every 5 years, or more frequently if you are over 62 years old.  Skin check.  Lung cancer screening. You may have this screening every year starting at age 69 if you have a 30-pack-year history of smoking and currently smoke or have quit within the past 15 years.  Fecal occult blood test (FOBT) of the stool. You may have this test every year starting at age 17.  Flexible sigmoidoscopy or colonoscopy. You may have a sigmoidoscopy every 5 years or a colonoscopy every 10 years starting at age 64.  Prostate cancer screening. Recommendations will vary depending on your family history and other risks.  Hepatitis C blood test.  Hepatitis B blood test.  Sexually transmitted disease (STD) testing.  Diabetes screening. This is done by checking your blood sugar (glucose) after you have not eaten for a while (fasting). You may have this done every 1-3 years.  Abdominal aortic aneurysm (AAA) screening. You may need this if you are a current or former smoker.  Osteoporosis. You may be screened starting at age 54 if you are at high risk. Talk with your health care provider about your test results, treatment options, and if necessary, the need for more tests. Vaccines  Your health care provider may recommend certain vaccines, such as:  Influenza vaccine. This is recommended every year.  Tetanus, diphtheria, and acellular  pertussis (Tdap, Td) vaccine. You may need a Td booster every 10 years.  Zoster vaccine. You may need this after age 48.  Pneumococcal 13-valent conjugate (PCV13) vaccine. One dose is recommended after age 79.  Pneumococcal polysaccharide (PPSV23)  vaccine. One dose is recommended after age 74. Talk to your health care provider about which screenings and vaccines you need and how often you need them. This information is not intended to replace advice given to you by your health care provider. Make sure you discuss any questions you have with your health care provider. Document Released: 10/28/2015 Document Revised: 06/20/2016 Document Reviewed: 08/02/2015 Elsevier Interactive Patient Education  2017 [employer] Prevention in the Home Falls can cause injuries. They can happen to people of all ages. There are many things you can do to make your home safe and to help prevent falls. What can I do on the outside of my home?  Regularly fix the edges of walkways and driveways and fix any cracks.  Remove anything that might make you trip as you walk through a door, such as a raised step or threshold.  Trim any bushes or trees on the path to your home.  Use bright outdoor lighting.  Clear any walking paths of anything that might make someone trip, such as rocks or tools.  Regularly check to see if handrails are loose or broken. Make sure that both sides of any steps have handrails.  Any raised decks and porches should have guardrails on the edges.  Have any leaves, snow, or ice cleared regularly.  Use sand or salt on walking paths during winter.  Clean up any spills in your garage right away. This includes oil or grease spills. What can I do in the bathroom?  Use night lights.  Install grab bars by the toilet and in the tub and shower. Do not use towel bars as grab bars.  Use non-skid mats or decals in the tub or shower.  If you need to sit down in the shower, use a plastic, non-slip stool.  Keep the floor dry. Clean up any water that spills on the floor as soon as it happens.  Remove soap buildup in the tub or shower regularly.  Attach bath mats securely with double-sided non-slip rug tape.  Do not have throw rugs  and other things on the floor that can make you trip. What can I do in the bedroom?  Use night lights.  Make sure that you have a light by your bed that is easy to reach.  Do not use any sheets or blankets that are too big for your bed. They should not hang down onto the floor.  Have a firm chair that has side arms. You can use this for support while you get dressed.  Do not have throw rugs and other things on the floor that can make you trip. What can I do in the kitchen?  Clean up any spills right away.  Avoid walking on wet floors.  Keep items that you use a lot in easy-to-reach places.  If you need to reach something above you, use a strong step stool that has a grab bar.  Keep electrical cords out of the way.  Do not use floor polish or wax that makes floors slippery. If you must use wax, use non-skid floor wax.  Do not have throw rugs and other things on the floor that can make you trip. What can I do with my stairs?  Do not leave any items on the stairs.  Make sure that there are handrails on both sides of the stairs and use them. Fix handrails that are broken or loose. Make sure that handrails are as long as the stairways.  Check any carpeting to make sure that it is firmly attached to the stairs. Fix any carpet that is loose or worn.  Avoid having throw rugs at the top or bottom of the stairs. If you do have throw rugs, attach them to the floor with carpet tape.  Make sure that you have a light switch at the top of the stairs and the bottom of the stairs. If you do not have them, ask someone to add them for you. What else can I do to help prevent falls?  Wear shoes that:  Do not have high heels.  Have rubber bottoms.  Are comfortable and fit you well.  Are closed at the toe. Do not wear sandals.  If you use a stepladder:  Make sure that it is fully opened. Do not climb a closed stepladder.  Make sure that both sides of the stepladder are locked into  place.  Ask someone to hold it for you, if possible.  Clearly mark and make sure that you can see:  Any grab bars or handrails.  First and last steps.  Where the edge of each step is.  Use tools that help you move around (mobility aids) if they are needed. These include:  Canes.  Walkers.  Scooters.  Crutches.  Turn on the lights when you go into a dark area. Replace any light bulbs as soon as they burn out.  Set up your furniture so you have a clear path. Avoid moving your furniture around.  If any of your floors are uneven, fix them.  If there are any pets around you, be aware of where they are.  Review your medicines with your doctor. Some medicines can make you feel dizzy. This can increase your chance of falling. Ask your doctor what other things that you can do to help prevent falls. This information is not intended to replace advice given to you by your health care provider. Make sure you discuss any questions you have with your health care provider. Document Released: 07/28/2009 Document Revised: 03/08/2016 Document Reviewed: 11/05/2014 Elsevier Interactive Patient Education  2017 [employer].

## 2020-02-12 ENCOUNTER — Telehealth: Payer: Self-pay | Admitting: Cardiovascular Disease

## 2020-02-12 DIAGNOSIS — Z5181 Encounter for therapeutic drug level monitoring: Secondary | ICD-10-CM | POA: Diagnosis not present

## 2020-02-12 DIAGNOSIS — I4891 Unspecified atrial fibrillation: Secondary | ICD-10-CM | POA: Diagnosis not present

## 2020-02-12 DIAGNOSIS — I4892 Unspecified atrial flutter: Secondary | ICD-10-CM | POA: Diagnosis not present

## 2020-02-12 LAB — LAB REPORT - SCANNED: INR: 3.5

## 2020-02-12 LAB — PROTIME-INR: INR: 3.5 — AB (ref <=1.1)

## 2020-02-12 NOTE — Telephone Encounter (Signed)
Pt/family called back and advised where to send the order to obtain a PT/INR today, which is [facility] in [facility] (fax number [phone]). Order was written and faxed. Pt is on his way to have INR drawn at this time. Will await for labs to be sent to our fax as instructed on the order.

## 2020-02-12 NOTE — Telephone Encounter (Signed)
If Home Health RN is calling please get Coumadin Nurse on the phone STAT  1.  Are you calling in regards to an appointment? Yes cancelled patient going to [place] [facility] and will have results faxed for review.   2.  Are you calling for a refill ? no  3.  Are you having bleeding issues? no  4.  Do you need clearance to hold Coumadin? no   Please route to the Coumadin Clinic Pool

## 2020-02-13 LAB — PROTIME-INR
INR: 3.5 — ABNORMAL HIGH (ref 0.9–1.2)
Prothrombin Time: 36 s — ABNORMAL HIGH (ref 9.1–12.0)

## 2020-02-15 ENCOUNTER — Ambulatory Visit (INDEPENDENT_AMBULATORY_CARE_PROVIDER_SITE_OTHER): Payer: PPO | Admitting: Cardiovascular Disease

## 2020-02-15 ENCOUNTER — Encounter: Payer: Self-pay | Admitting: Cardiovascular Disease

## 2020-02-15 DIAGNOSIS — I4892 Unspecified atrial flutter: Secondary | ICD-10-CM

## 2020-02-15 DIAGNOSIS — Z5181 Encounter for therapeutic drug level monitoring: Secondary | ICD-10-CM | POA: Diagnosis not present

## 2020-02-15 DIAGNOSIS — G4733 Obstructive sleep apnea (adult) (pediatric): Secondary | ICD-10-CM | POA: Diagnosis not present

## 2020-02-15 DIAGNOSIS — I482 Chronic atrial fibrillation, unspecified: Secondary | ICD-10-CM | POA: Diagnosis not present

## 2020-02-15 DIAGNOSIS — I4891 Unspecified atrial fibrillation: Secondary | ICD-10-CM

## 2020-02-15 NOTE — Patient Instructions (Signed)
Description   Spoke with pt's wife and instructed pt to continue Warfarin 1.5 tablets every day EXCEPT 1 tablet on [place]. Advised to have a leafy veggie today and remain consistent. Recheck INR in 6 weeks.

## 2020-03-08 ENCOUNTER — Other Ambulatory Visit: Payer: Self-pay | Admitting: Family Medicine

## 2020-03-08 DIAGNOSIS — E119 Type 2 diabetes mellitus without complications: Secondary | ICD-10-CM

## 2020-03-08 MED ORDER — ONETOUCH ULTRA BLUE VI STRP: ORAL_STRIP | 12 refills | Status: DC

## 2020-03-08 NOTE — Telephone Encounter (Signed)
Patient requesting glucose blood (ONE TOUCH ULTRA TEST) test strip informed patient please allow 48 to 72 hour turn around   [place], [place] Phone:  [phone]  Fax:  [phone]

## 2020-03-08 NOTE — Telephone Encounter (Signed)
Requested Prescriptions  Pending Prescriptions Disp Refills  . glucose blood (ONE TOUCH ULTRA TEST) test strip 100 each 12    Sig: USE ONE STRIP TO CHECK GLUCOSE ONCE DAILY     Endocrinology: Diabetes - Testing Supplies Passed - 03/08/2020  1:54 PM      Passed - Valid encounter within last 12 months    Recent Outpatient Visits          2 months ago Type 2 diabetes mellitus without complication, without long-term current use of insulin [facility])   [facility] [name], MD   7 months ago Type 2 diabetes mellitus without complication, without long-term current use of insulin [facility])   [facility] [name], MD   8 months ago Chronic atrial fibrillation   [facility] [name], MD   11 months ago Type 2 diabetes mellitus without complication, without long-term current use of insulin [facility])   [facility] [name], MD   1 year ago [place] [name], MD      Future Appointments            In 3 months Fletcher Anon, Mertie Clause, MD [facility], [facility]   In 3 months [name], MD [facility], [facility]

## 2020-03-28 ENCOUNTER — Other Ambulatory Visit: Payer: Self-pay | Admitting: Cardiovascular Disease

## 2020-03-28 ENCOUNTER — Encounter: Payer: Self-pay | Admitting: Cardiovascular Disease

## 2020-03-28 ENCOUNTER — Other Ambulatory Visit: Payer: Self-pay

## 2020-03-28 DIAGNOSIS — Z5181 Encounter for therapeutic drug level monitoring: Secondary | ICD-10-CM | POA: Diagnosis not present

## 2020-03-28 DIAGNOSIS — I080 Rheumatic disorders of both mitral and aortic valves: Secondary | ICD-10-CM | POA: Diagnosis not present

## 2020-03-28 DIAGNOSIS — I482 Chronic atrial fibrillation, unspecified: Secondary | ICD-10-CM | POA: Diagnosis not present

## 2020-03-28 DIAGNOSIS — I4891 Unspecified atrial fibrillation: Secondary | ICD-10-CM

## 2020-03-28 DIAGNOSIS — I4892 Unspecified atrial flutter: Secondary | ICD-10-CM | POA: Diagnosis not present

## 2020-03-28 LAB — POCT INR: INR: 2.6 (ref 2.0–3.0)

## 2020-03-29 LAB — PROTIME-INR
INR: 2.6 — ABNORMAL HIGH (ref 0.9–1.2)
Prothrombin Time: 27.2 s — ABNORMAL HIGH (ref 9.1–12.0)

## 2020-03-30 ENCOUNTER — Ambulatory Visit (INDEPENDENT_AMBULATORY_CARE_PROVIDER_SITE_OTHER): Payer: PPO

## 2020-03-30 DIAGNOSIS — I4892 Unspecified atrial flutter: Secondary | ICD-10-CM | POA: Diagnosis not present

## 2020-03-30 DIAGNOSIS — I4891 Unspecified atrial fibrillation: Secondary | ICD-10-CM | POA: Diagnosis not present

## 2020-03-30 DIAGNOSIS — I482 Chronic atrial fibrillation, unspecified: Secondary | ICD-10-CM

## 2020-03-30 DIAGNOSIS — Z5181 Encounter for therapeutic drug level monitoring: Secondary | ICD-10-CM

## 2020-03-30 NOTE — Patient Instructions (Signed)
-   Spoke with pt's wife and instructed pt to continue Warfarin 1.5 tablets every day EXCEPT 1 tablet on [place]. - Recheck INR in 6 weeks.

## 2020-04-06 DIAGNOSIS — B351 Tinea unguium: Secondary | ICD-10-CM | POA: Diagnosis not present

## 2020-04-06 DIAGNOSIS — E119 Type 2 diabetes mellitus without complications: Secondary | ICD-10-CM | POA: Diagnosis not present

## 2020-04-10 ENCOUNTER — Other Ambulatory Visit: Payer: Self-pay | Admitting: Cardiovascular Disease

## 2020-04-11 DIAGNOSIS — Z950 Presence of cardiac pacemaker: Secondary | ICD-10-CM | POA: Diagnosis not present

## 2020-04-23 ENCOUNTER — Other Ambulatory Visit: Payer: Self-pay | Admitting: Cardiovascular Disease

## 2020-04-26 ENCOUNTER — Other Ambulatory Visit: Payer: Self-pay | Admitting: Cardiovascular Disease

## 2020-04-27 ENCOUNTER — Other Ambulatory Visit: Payer: Self-pay | Admitting: Cardiovascular Disease

## 2020-04-27 NOTE — Telephone Encounter (Signed)
°*  STAT* If patient is at the pharmacy, call can be transferred to refill team.   1. Which medications need to be refilled? (please list name of each medication and dose if known)   Metoprolol 12.5 BID  2. Which pharmacy/location (including street and city if local pharmacy) is medication to be sent to?  [place]   3. Do they need a 30 day or 90 day supply? 90    Patient is out of meds

## 2020-04-27 NOTE — Telephone Encounter (Signed)
*  STAT* If patient is at the pharmacy, call can be transferred to refill team.   1. Which medications need to be refilled? (please list name of each medication and dose if known) Metoprolol  2. Which pharmacy/location (including street and city if local pharmacy) is medication to be sent to? [facility] [address]  3. Do they need a 30 day or 90 day supply? 90   

## 2020-05-03 ENCOUNTER — Other Ambulatory Visit: Payer: Self-pay | Admitting: Cardiovascular Disease

## 2020-05-04 NOTE — Telephone Encounter (Signed)
Refill Request.  

## 2020-05-05 ENCOUNTER — Other Ambulatory Visit: Payer: Self-pay | Admitting: Cardiovascular Disease

## 2020-05-06 NOTE — Telephone Encounter (Signed)
Please review for refill, Thanks !  

## 2020-05-07 DIAGNOSIS — Z950 Presence of cardiac pacemaker: Secondary | ICD-10-CM | POA: Diagnosis not present

## 2020-05-10 ENCOUNTER — Other Ambulatory Visit: Payer: Self-pay | Admitting: Cardiovascular Disease

## 2020-05-11 ENCOUNTER — Other Ambulatory Visit: Payer: Self-pay

## 2020-05-11 ENCOUNTER — Ambulatory Visit (INDEPENDENT_AMBULATORY_CARE_PROVIDER_SITE_OTHER): Payer: PPO

## 2020-05-11 DIAGNOSIS — I4891 Unspecified atrial fibrillation: Secondary | ICD-10-CM | POA: Diagnosis not present

## 2020-05-11 DIAGNOSIS — I4892 Unspecified atrial flutter: Secondary | ICD-10-CM

## 2020-05-11 DIAGNOSIS — Z952 Presence of prosthetic heart valve: Secondary | ICD-10-CM | POA: Diagnosis not present

## 2020-05-11 DIAGNOSIS — I482 Chronic atrial fibrillation, unspecified: Secondary | ICD-10-CM | POA: Diagnosis not present

## 2020-05-11 DIAGNOSIS — Z5181 Encounter for therapeutic drug level monitoring: Secondary | ICD-10-CM

## 2020-05-11 DIAGNOSIS — Z950 Presence of cardiac pacemaker: Secondary | ICD-10-CM

## 2020-05-11 LAB — POCT INR: INR: 3 (ref 2.0–3.0)

## 2020-05-11 NOTE — Telephone Encounter (Signed)
Please review for refill. Thanks!  

## 2020-05-11 NOTE — Patient Instructions (Signed)
-   continue Warfarin 1.5 tablets every day EXCEPT 1 tablet on [place]. - Recheck INR in 6 weeks.

## 2020-05-25 ENCOUNTER — Other Ambulatory Visit: Payer: Self-pay | Admitting: Cardiovascular Disease

## 2020-06-07 ENCOUNTER — Other Ambulatory Visit: Payer: Self-pay

## 2020-06-07 ENCOUNTER — Ambulatory Visit
Admission: RE | Admit: 2020-06-07 | Discharge: 2020-06-07 | Disposition: A | Discharge status: Home / Self Care | Payer: PPO | Source: Ambulatory Visit | Attending: Internal Medicine | Admitting: Internal Medicine

## 2020-06-07 ENCOUNTER — Other Ambulatory Visit: Payer: Self-pay | Admitting: Family Medicine

## 2020-06-07 DIAGNOSIS — I700 Atherosclerosis of aorta: Secondary | ICD-10-CM | POA: Diagnosis not present

## 2020-06-07 DIAGNOSIS — I2510 Atherosclerotic heart disease of native coronary artery without angina pectoris: Secondary | ICD-10-CM | POA: Diagnosis not present

## 2020-06-07 DIAGNOSIS — Z952 Presence of prosthetic heart valve: Secondary | ICD-10-CM | POA: Diagnosis not present

## 2020-06-07 DIAGNOSIS — K279 Peptic ulcer, site unspecified, unspecified as acute or chronic, without hemorrhage or perforation: Secondary | ICD-10-CM

## 2020-06-07 DIAGNOSIS — R590 Localized enlarged lymph nodes: Secondary | ICD-10-CM | POA: Diagnosis not present

## 2020-06-07 LAB — POCT I-STAT CREATININE: Creatinine, Ser: 1.2 mg/dL (ref 0.61–1.24)

## 2020-06-07 MED ORDER — IOHEXOL 300 MG/ML  SOLN
75.0000 mL | Freq: Once | INTRAMUSCULAR | Status: AC | PRN
  Administered 2020-06-07: 75 mL via INTRAVENOUS

## 2020-06-07 NOTE — Telephone Encounter (Signed)
Requested Prescriptions  Pending Prescriptions Disp Refills  . pantoprazole (PROTONIX) 40 MG tablet [Pharmacy Med Name: Pantoprazole Sodium 40 MG Oral Tablet Delayed Release] 60 tablet 0    Sig: Take 1 tablet by mouth twice daily     Gastroenterology: Proton Pump Inhibitors Passed - 06/07/2020  9:28 PM      Passed - Valid encounter within last 12 months    Recent Outpatient Visits          5 months ago Type 2 diabetes mellitus without complication, without long-term current use of insulin [facility])   [facility] [name], MD   10 months ago Type 2 diabetes mellitus without complication, without long-term current use of insulin [facility])   [facility] [name], MD   11 months ago Chronic atrial fibrillation   [facility] [name], MD   1 year ago Type 2 diabetes mellitus without complication, without long-term current use of insulin [facility])   [facility] [name], MD   1 year ago [place] [name], MD      Future Appointments            In 2 days Wellington Hampshire, MD [facility], [facility]   In 1 week [name], MD [facility], [facility]

## 2020-06-08 ENCOUNTER — Inpatient Hospital Stay: Payer: PPO | Admitting: Internal Medicine

## 2020-06-08 ENCOUNTER — Encounter: Payer: Self-pay | Admitting: Internal Medicine

## 2020-06-08 ENCOUNTER — Inpatient Hospital Stay: Payer: PPO | Attending: Internal Medicine

## 2020-06-08 DIAGNOSIS — R413 Other amnesia: Secondary | ICD-10-CM | POA: Insufficient documentation

## 2020-06-08 DIAGNOSIS — Z87891 Personal history of nicotine dependence: Secondary | ICD-10-CM | POA: Insufficient documentation

## 2020-06-08 DIAGNOSIS — Z79899 Other long term (current) drug therapy: Secondary | ICD-10-CM | POA: Diagnosis not present

## 2020-06-08 DIAGNOSIS — I4891 Unspecified atrial fibrillation: Secondary | ICD-10-CM | POA: Diagnosis not present

## 2020-06-08 DIAGNOSIS — R634 Abnormal weight loss: Secondary | ICD-10-CM | POA: Insufficient documentation

## 2020-06-08 DIAGNOSIS — I700 Atherosclerosis of aorta: Secondary | ICD-10-CM | POA: Insufficient documentation

## 2020-06-08 DIAGNOSIS — R590 Localized enlarged lymph nodes: Secondary | ICD-10-CM

## 2020-06-08 DIAGNOSIS — Z8551 Personal history of malignant neoplasm of bladder: Secondary | ICD-10-CM | POA: Insufficient documentation

## 2020-06-08 DIAGNOSIS — C641 Malignant neoplasm of right kidney, except renal pelvis: Secondary | ICD-10-CM | POA: Diagnosis not present

## 2020-06-08 DIAGNOSIS — Z7901 Long term (current) use of anticoagulants: Secondary | ICD-10-CM | POA: Diagnosis not present

## 2020-06-08 LAB — CBC WITH DIFFERENTIAL/PLATELET
Abs Immature Granulocytes: 0.03 10*3/uL (ref 0.00–0.07)
Basophils Absolute: 0.1 10*3/uL (ref 0.0–0.1)
Basophils Relative: 1 %
Eosinophils Absolute: 0.4 10*3/uL (ref 0.0–0.5)
Eosinophils Relative: 5 %
HCT: 39.5 % (ref 39.0–52.0)
Hemoglobin: 13.2 g/dL (ref 13.0–17.0)
Immature Granulocytes: 0 %
Lymphocytes Relative: 27 %
Lymphs Abs: 2 10*3/uL (ref 0.7–4.0)
MCH: 30.2 pg (ref 26.0–34.0)
MCHC: 33.4 g/dL (ref 30.0–36.0)
MCV: 90.4 fL (ref 80.0–100.0)
Monocytes Absolute: 0.7 10*3/uL (ref 0.1–1.0)
Monocytes Relative: 9 %
Neutro Abs: 4.1 10*3/uL (ref 1.7–7.7)
Neutrophils Relative %: 58 %
Platelets: 188 10*3/uL (ref 150–400)
RBC: 4.37 MIL/uL (ref 4.22–5.81)
RDW: 13 % (ref 11.5–15.5)
WBC: 7.2 10*3/uL (ref 4.0–10.5)
nRBC: 0 % (ref 0.0–0.2)

## 2020-06-08 LAB — COMPREHENSIVE METABOLIC PANEL
ALT: 17 U/L (ref 0–44)
AST: 28 U/L (ref 15–41)
Albumin: 4.3 g/dL (ref 3.5–5.0)
Alkaline Phosphatase: 64 U/L (ref 38–126)
Anion gap: 10 (ref 5–15)
BUN: 16 mg/dL (ref 8–23)
CO2: 26 mmol/L (ref 22–32)
Calcium: 9 mg/dL (ref 8.9–10.3)
Chloride: 102 mmol/L (ref 98–111)
Creatinine, Ser: 1.13 mg/dL (ref 0.61–1.24)
GFR calc Af Amer: >60 mL/min (ref >60)
GFR calc non Af Amer: 59 mL/min — ABNORMAL LOW (ref >60)
Glucose, Bld: 183 mg/dL — ABNORMAL HIGH (ref 70–99)
Potassium: 4.3 mmol/L (ref 3.5–5.1)
Sodium: 138 mmol/L (ref 135–145)
Total Bilirubin: 0.7 mg/dL (ref 0.3–1.2)
Total Protein: 8.2 g/dL — ABNORMAL HIGH (ref 6.5–8.1)

## 2020-06-08 LAB — LACTATE DEHYDROGENASE: LDH: 234 U/L — ABNORMAL HIGH (ref 98–192)

## 2020-06-08 NOTE — Progress Notes (Signed)
[place] NOTE  Patient Care Team: [name], MD as PCP - General (Unknown Physician Specialty) Wellington Hampshire, MD as PCP - Cardiology (Cardiology) Margaretha Sheffield, MD (Otolaryngology) Leandrew Koyanagi, MD as Referring Physician (Ophthalmology) Sharlotte Alamo, DPM as Consulting Physician (Podiatry) Bernardo Heater, Ronda Fairly, MD as Consulting Physician (Urology) Jannet Mantis, MD as Consulting Physician (Dermatology) Telford Nab, RN as Registered Nurse [name], MD as Consulting Physician (Gastroenterology) Harleigh, Deanne Coffer, PA-C (Cardiology) Neldon Labella, RN as Case Manager  CHIEF COMPLAINTS/PURPOSE OF CONSULTATION:  Mediastinal adenopathy  #  Oncology History Overview Note  #February 2020 gastroenteropathy/right supraclavicular adenopathy-incidental]-PET scan 2020-low-grade FDG activity-differential low-grade lymphoma versus benign reactive process. no biopsy  #Right kidney-cancer status post cryo [[facility]]  # 2020- colonoscopy s/p polypectomy  [Dr.Wohl-]  #CAD status post CABG; mechanical valve replacement [2019 August; [facility]]; on Coumadin     Adenocarcinoma, renal cell ([place])  06/13/2015 Initial Diagnosis   Adenocarcinoma, renal cell (HCC)   Cancer of right kidney ([place])  12/10/2018 Initial Diagnosis   Cancer of right kidney [facility])     HISTORY OF PRESENTING ILLNESS:  [name] [name] 86 y.o.  male is here for follow-up to review the results of his CT scan/follow-up of mediastinal/neck adenopathy.  As per the family patient has been low-grade temperatures of 99.  Positive for weight loss poor appetite.  Also notes to have change in his taste.  More recently patient noted to have significant short-term memory loss.  Otherwise no worsening dizziness or falls.  No new shortness of breath or cough.  No blood in stools or black or stools..  Review of Systems  Constitutional: Positive for malaise/fatigue and weight loss.  Negative for chills, diaphoresis and fever.  HENT: Negative for nosebleeds and sore throat.   Eyes: Negative for double vision.  Respiratory: Negative for cough, hemoptysis, sputum production, shortness of breath and wheezing.   Cardiovascular: Negative for chest pain, palpitations, orthopnea and leg swelling.  Gastrointestinal: Negative for abdominal pain, blood in stool, constipation, diarrhea, heartburn, melena, nausea and vomiting.  Genitourinary: Negative for dysuria, frequency and urgency.  Musculoskeletal: Negative for back pain and joint pain.  Skin: Negative.  Negative for itching and rash.  Neurological: Positive for dizziness. Negative for tingling, focal weakness, weakness and headaches.  Endo/Heme/Allergies: Does not bruise/bleed easily.  Psychiatric/Behavioral: Negative for depression. The patient is not nervous/anxious and does not have insomnia.      MEDICAL HISTORY:  Past Medical History:  Diagnosis Date  . Aortic stenosis    a. 01/2016 echo: mod AS; b. 04/2018 Cardiac MRI: sev AS; c. 05/2018 s/p mech AVR @ time of myomectomy; d. 07/2018 Echo: Mild AS/AI.  [name] Atherosclerosis of aorta ([place])    by CT scan in past  . Atrial fibrillation ([place])    and aflutter. pt has a left atrial circuit that is not ablated. was on amiodarone-stopped, now use rate control.   . Bladder cancer ([place])    hx; treated with BCG in past   . Carotid artery disease ([place])    There was calcified plaque but no stenosis by carotid artery screening done at [facility] October, 2013  . CHB (complete heart block) ([place])    a. 05/2018 s/p BSX VVIR PPM ([facility]) in setting of bradycardia following myomectomy and AVR/MVR.  . Diabetes mellitus    type not specified  . Diabetic neuropathy (HCC)    feet  . Elevated liver enzymes    over time; hx  .  Excessive sweating   . Glaucoma   . Gout   . Head injury    when slipped on ice 2004-2005. stabilized and back on Coumadin   . Headache    migraines  - distant past  . History of cardiac cath    a. 05/2018 Cath ([facility]): Non-obstructive CAD. Mildly elevated filling pressures w/ nl CI.  [name] HOCM (hypertrophic obstructive cardiomyopathy) ([place])    a. 01/2016 Echo: EF 65-70%, no rwma, LVOT gradient of 80-88mmHg, mod AS, SAM; b. 11/2017 Echo: EF 55-60%, near cavity obliteration in systole, mod AS, mild MS; c. 04/2018 Card MRI ([facility]): Sev LVH, EF >70%, Sev AS, LVOT obs w/ Sev MR, mild to mod TR/PR, mid-myocardial HE in basal-mid anteroseptum and inferoseptum; d. 05/2018 s/p Septal myomectomy; e. 07/2018 Echo: EF 60-65%. PASP 60mmHg.  [name] Homocystinemia ([place])    elevated, mild   . Hypercholesterolemia    treated.   . Hypertension   . Infection of right inner ear   . Kidney mass    a. s/p laproscopic surgery woth cryoablation of a mass outside kidney followed at [facility]; b. 11/2018 CT Chest: 2cm R kidney mass.  . Mediastinal adenopathy    a. 12/208 CT Chest: up to 55mm LLL lung nodule. 1.5-1.7cm subcarinal/mediastinal adenopahty/right paratracheal lymph node; b. 11/2018 CT Chest: 2.5-3cm mediastinal adenopathy.  . Mitral regurgitation    a. 04/2018 Cardiac MRI: sev MR; b. 05/2018 s/p mech MVR @ time of myomectomy; c. 07/2018 Echo: Mild MS.  . Motion sickness    ocean boats  . Nausea   . Nonobstructive coronary artery disease    a. 05/2018 Cath ([facility]): nonobs CAD.  [name] Obstructive sleep apnea    CPAP started successfully 2014  . Orthostatic hypotension    a. orthostatic. dehydration. hospitalized 11/11  . Sleep apnea    Significant obstructive sleep apnea diagnosed in August, 2012, the patient is to receive CPAP   . Stroke [facility])    "2 old strokes" CT and MRI. [place] hospital 11/11. diagnosis was dehydration, no acutal reports.   . TSH elevation    on synthroid historically   . Unsteady gait    August, 2012    SURGICAL HISTORY: Past Surgical History:  Procedure Laterality Date  . BLADDER SURGERY    . CATARACT EXTRACTION W/PHACO Left 05/11/2015    Procedure: CATARACT EXTRACTION PHACO AND INTRAOCULAR LENS PLACEMENT (IOC);  Surgeon: Leandrew Koyanagi, MD;  Location: [place];  Service: Ophthalmology;  Laterality: Left;  DIABETIC - oral meds, CPAP  . COLONOSCOPY WITH PROPOFOL N/A 04/14/2019   Procedure: COLONOSCOPY WITH PROPOFOL;  Surgeon: [name], MD;  Location: [facility] ENDOSCOPY;  Service: Endoscopy;  Laterality: N/A;  . ESOPHAGOGASTRODUODENOSCOPY (EGD) WITH PROPOFOL N/A 07/01/2018   Procedure: ESOPHAGOGASTRODUODENOSCOPY (EGD) WITH PROPOFOL;  Surgeon: [name], MD;  Location: [facility] ENDOSCOPY;  Service: Endoscopy;  Laterality: N/A;  . INSERT / REPLACE / REMOVE PACEMAKER     Chemical engineer   . KIDNEY SURGERY     "froze mass"  . MECHANICAL AORTIC AND MITRAL VALVE REPLACEMENT  05/2018   [facility]   . TONSILLECTOMY    . VALVE REPLACEMENT      SOCIAL HISTORY: Social History   Socioeconomic History  . Marital status: Married    Spouse name: Jasmine Awe  . Number of children: 1  . Years of education: 82  . Highest education level: Bachelor's degree (e.g., BA, AB, BS)  Occupational History  . Occupation: retired  Tobacco Use  . Smoking status: Former  Smoker    Types: Cigars, Cigarettes    Quit date: 10/02/1974    Years since quitting: 45.7  . Smokeless tobacco: Never Used  . Tobacco comment: still smokes cigars once a day  Vaping Use  . Vaping Use: Never used  Substance and Sexual Activity  . Alcohol use: No  . Drug use: No  . Sexual activity: Yes  Other Topics Concern  . Not on file  Social History Narrative   Married, retired, gets regular exercise.    Social Determinants of Health   Financial Resource Strain: Low Risk   . Difficulty of Paying Living Expenses: Not hard at all  Food Insecurity: No Food Insecurity  . Worried About Charity fundraiser in the Last Year: Never true  . Ran Out of Food in the Last Year: Never true  Transportation Needs: No Transportation Needs  . Lack of Transportation  (Medical): No  . Lack of Transportation (Non-Medical): No  Physical Activity: Inactive  . Days of Exercise per Week: 0 days  . Minutes of Exercise per Session: 0 min  Stress: No Stress Concern Present  . Feeling of Stress : Not at all  Social Connections: Moderately Isolated  . Frequency of Communication with Friends and Family: More than three times a week  . Frequency of Social Gatherings with Friends and Family: More than three times a week  . Attends Religious Services: Never  . Active Member of Clubs or Organizations: No  . Attends Archivist Meetings: Never  . Marital Status: Married  Human resources officer Violence: Not At Risk  . Fear of Current or Ex-Partner: No  . Emotionally Abused: No  . Physically Abused: No  . Sexually Abused: No    FAMILY HISTORY: Family History  Problem Relation Age of Onset  . Arrhythmia Father        A-Fib  . Prostate cancer Father   . Stroke Father   . Breast cancer Mother   . Arrhythmia Brother        A-Fib  . Heart attack Neg Hx   . Hypertension Neg Hx     ALLERGIES:  is allergic to macrolides and ketolides, mycinettes, nitrofuran derivatives, nitrofurantoin, erythromycin, zofran [ondansetron hcl-dextrose], and zofran [ondansetron hcl].  MEDICATIONS:  Current Outpatient Medications  Medication Sig Dispense Refill  . aspirin EC 81 MG tablet Take 1 tablet (81 mg total) by mouth daily.    [name] glucose blood (ONE TOUCH ULTRA TEST) test strip USE ONE STRIP TO CHECK GLUCOSE ONCE DAILY 100 each 12  . Lancets (ONETOUCH DELICA PLUS [id number]) MISC USE 1  TO CHECK GLUCOSE ONCE DAILY 100 each 11  . latanoprost (XALATAN) 0.005 % ophthalmic solution Place 1 drop into both eyes at bedtime.     [name] levothyroxine (SYNTHROID) 50 MCG tablet Take 1 tablet (50 mcg total) by mouth daily. 90 tablet 3  . meclizine (ANTIVERT) 12.5 MG tablet Take 1 tablet (12.5 mg total) by mouth 3 (three) times daily as needed for dizziness. (Patient taking differently: Take  12.5 mg by mouth 3 (three) times daily as needed for dizziness. takes 1/2 tab every morning) 90 tablet 3  . metFORMIN (GLUCOPHAGE) 1000 MG tablet Take 1 tablet by mouth once daily with breakfast 90 tablet 1  . metoprolol tartrate (LOPRESSOR) 25 MG tablet Take 1/2 (one-half) tablet by mouth twice daily 90 tablet 0  . ONETOUCH DELICA LANCETS 34K MISC USE ONE LANCET TO CHECK BLOOD GLUCOSE ONCE DAILY 100 each 12  . ONETOUCH  ULTRA test strip USE 1 STRIP TO CHECK GLUCOSE ONCE DAILY 50 each 11  . pantoprazole (PROTONIX) 40 MG tablet Take 1 tablet by mouth twice daily 180 tablet 2  . simvastatin (ZOCOR) 40 MG tablet TAKE 1/2 (ONE-HALF) TABLET BY MOUTH AT BEDTIME 45 tablet 3  . warfarin (COUMADIN) 3 MG tablet TAKE 1 & 1/2 (ONE & ONE-HALF) TABLETS BY MOUTH ONCE DAILY EXCEPT  1  TABLET  ON  MONDAY  AND  FRIDAY  OR  AS  DIRECTED  BY  COUMADIN  CLINIC 60 tablet 0  . ALPRAZolam (XANAX) 0.25 MG tablet Take 1 tablet (0.25 mg total) by mouth every 6 (six) hours as needed for anxiety. (Patient not taking: Reported on 06/08/2020) 60 tablet 0  . amLODipine (NORVASC) 2.5 MG tablet Take 1 tablet by mouth once daily (Patient not taking: Reported on 06/08/2020) 90 tablet 0   No current facility-administered medications for this visit.      [name]  PHYSICAL EXAMINATION: ECOG PERFORMANCE STATUS: 0 - Asymptomatic  Vitals:   06/08/20 1259  BP: (!) 111/56  Pulse: 70  Temp: 99.7 F (37.6 C)  SpO2: 98%   Filed Weights   06/08/20 1259  Weight: 188 lb (85.3 kg)    Physical Exam Constitutional:      Comments: He is walking with a cane.  He is accompanied by his wife/daughter.  HENT:     Head: Normocephalic and atraumatic.     Mouth/Throat:     Pharynx: No oropharyngeal exudate.  Eyes:     Pupils: Pupils are equal, round, and reactive to light.  Cardiovascular:     Rate and Rhythm: Normal rate and regular rhythm.  Pulmonary:     Effort: Pulmonary effort is normal. No respiratory distress.     Breath sounds:  Normal breath sounds. No wheezing.  Abdominal:     General: Bowel sounds are normal. There is no distension.     Palpations: Abdomen is soft. There is no mass.     Tenderness: There is no abdominal tenderness. There is no guarding or rebound.  Musculoskeletal:        General: No tenderness. Normal range of motion.     Cervical back: Normal range of motion and neck supple.  Skin:    General: Skin is warm.  Neurological:     Mental Status: He is alert and oriented to person, place, and time.  Psychiatric:        Mood and Affect: Affect normal.      LABORATORY DATA:  I have reviewed the data as listed Lab Results  Component Value Date   WBC 7.2 06/08/2020   HGB 13.2 06/08/2020   HCT 39.5 06/08/2020   MCV 90.4 06/08/2020   PLT 188 06/08/2020   Recent Labs    07/30/19 0956 07/30/19 0956 12/07/19 0912 06/07/20 1313 06/08/20 1249  NA 139  --  137  --  138  K 4.9  --  4.3  --  4.3  CL 97  --  101  --  102  CO2  --   --  26  --  26  GLUCOSE 124*  --  155*  --  183*  BUN 17  --  23  --  16  CREATININE 1.05   < > 1.11 1.20 1.13  CALCIUM 9.3  --  9.2  --  9.0  GFRNONAA 65  --  >60  --  59*  GFRAA 76  --  >60  --  >  60  PROT 7.2  --  8.5*  --  8.2*  ALBUMIN 4.2  --  4.3  --  4.3  AST 34  --  34  --  28  ALT  --   --  21  --  17  ALKPHOS 72  --  73  --  64  BILITOT 0.7  --  0.7  --  0.7   < > = values in this interval not displayed.    RADIOGRAPHIC STUDIES: I have personally reviewed the radiological images as listed and agreed with the findings in the report. CT Chest W Contrast  Result Date: 06/07/2020 CLINICAL DATA:  Mediastinal lymphadenopathy, previously with low-grade FDG avidity. History of bladder cancer and renal cell carcinoma. EXAM: CT CHEST WITH CONTRAST TECHNIQUE: Multidetector CT imaging of the chest was performed during intravenous contrast administration. CONTRAST:  73mL OMNIPAQUE IOHEXOL 300 MG/ML  SOLN COMPARISON:  CT chest, 12/07/2019, PET-CT, 06/09/2019  FINDINGS: Cardiovascular: Aortic atherosclerosis. Aortic valve and mitral valve prostheses. Normal heart size. Three-vessel coronary artery calcifications and/or stents. Left chest pacer defibrillator. No pericardial effusion. Mediastinum/Nodes: No significant change in enlarged pretracheal, subcarinal, and AP window lymph nodes, largest pretracheal node measuring 2.3 x 1.7 cm (series 2, image 55). Thyroid gland, trachea, and esophagus demonstrate no significant findings. Lungs/Pleura: Lungs are clear. No pleural effusion or pneumothorax. Upper Abdomen: No acute abnormality. Coarse, nodular contour of the liver in the included upper abdomen. Rim calcified exophytic lesion of the lateral midportion of the left kidney, incompletely imaged. Musculoskeletal: No chest wall mass or suspicious bone lesions identified. IMPRESSION: 1. No significant change in enlarged pretracheal, subcarinal, and AP window lymph nodes, largest pretracheal node measuring 2.3 x 1.7 cm. 2. No evidence of primary malignancy in the chest. 3. Cirrhotic morphology of the liver in the included upper abdomen. 4. Rim calcified exophytic lesion of the lateral midportion of the left kidney, incompletely imaged and in keeping with history of renal cell carcinoma. 5. Coronary artery disease.  Aortic Atherosclerosis (ICD10-I70.0). Electronically Signed   By: Eddie Candle M.D.   On: 06/07/2020 16:10    ASSESSMENT & PLAN:   Mediastinal lymphadenopathy # Mediastinal LN/right supraclavicular adenopathy 1-2 centimeters lymph node. [Since December 2018]. AUG 2021--CT scan chest ~2 cm;STABLE; not significantly changed from baseline.[name]  #Again reviewed the differential including but not limited to low-grade lymphoma versus less likely benign reactive lymph node.  Given his comorbidities I would not recommend any further work-up like biopsy/as this would not change the current management-of surveillance.  I do not think patient's weight loss/fatigue-is  related to his possible diagnosis of lymphoma-see below  #Weight loss fatigue/change in dietary preference-suspect secondary to dementia versus other causes.  Discussed regarding neurology evaluation.  Defer to PCP for further work-up/recommendations.   # RCC s/p Right ablation-2020 PET scan stable.  August 2021-CT scan partial evaluation; hold off imaging at this time  # DISPOSITION: # in  6 months- MD/labs-cbc/cmp/ldh-/CT scan prior Dr.B  # I reviewed the blood work- with the patient/wife and daughter in detail; also reviewed the imaging independently [as summarized above]; and with the patient in detail.   Cc; Dr.Gilbert .  All questions were answered. The patient knows to call the clinic with any problems, questions or concerns.    Cammie Sickle, MD 06/09/2020 2:15 PM

## 2020-06-08 NOTE — Assessment & Plan Note (Addendum)
#   Mediastinal LN/right supraclavicular adenopathy 1-2 centimeters lymph node. [Since December 2018]. AUG 2021--CT scan chest ~2 cm;STABLE; not significantly changed from baseline.[name]  #Again reviewed the differential including but not limited to low-grade lymphoma versus less likely benign reactive lymph node.  Given his comorbidities I would not recommend any further work-up like biopsy/as this would not change the current management-of surveillance.  I do not think patient's weight loss/fatigue-is related to his possible diagnosis of lymphoma-see below  #Weight loss fatigue/change in dietary preference-suspect secondary to dementia versus other causes.  Discussed regarding neurology evaluation.  Defer to PCP for further work-up/recommendations.   # RCC s/p Right ablation-2020 PET scan stable.  August 2021-CT scan partial evaluation; hold off imaging at this time  # DISPOSITION: # in  6 months- MD/labs-cbc/cmp/ldh-/CT scan prior Dr.B  # I reviewed the blood work- with the patient/wife and daughter in detail; also reviewed the imaging independently [as summarized above]; and with the patient in detail.   Cc; Dr.Gilbert .

## 2020-06-09 ENCOUNTER — Ambulatory Visit: Payer: PPO | Admitting: Cardiovascular Disease

## 2020-06-09 ENCOUNTER — Other Ambulatory Visit: Payer: Self-pay

## 2020-06-09 ENCOUNTER — Encounter: Payer: Self-pay | Admitting: Cardiovascular Disease

## 2020-06-09 VITALS — BP 128/62 | HR 70 | Ht 67.0 in | Wt 189.0 lb

## 2020-06-09 DIAGNOSIS — Z952 Presence of prosthetic heart valve: Secondary | ICD-10-CM | POA: Diagnosis not present

## 2020-06-09 DIAGNOSIS — I421 Obstructive hypertrophic cardiomyopathy: Secondary | ICD-10-CM

## 2020-06-09 DIAGNOSIS — I2510 Atherosclerotic heart disease of native coronary artery without angina pectoris: Secondary | ICD-10-CM

## 2020-06-09 DIAGNOSIS — I482 Chronic atrial fibrillation, unspecified: Secondary | ICD-10-CM | POA: Diagnosis not present

## 2020-06-09 DIAGNOSIS — I10 Essential (primary) hypertension: Secondary | ICD-10-CM

## 2020-06-09 NOTE — Patient Instructions (Signed)

## 2020-06-09 NOTE — Progress Notes (Signed)
Cardiology Office Note   Date:  06/09/2020   ID:  [name] [name], [name] [name] 27-Dec-1933, MRN [id number]  PCP:  [name], MD  Cardiologist:   Kathlyn Sacramento, MD   Chief Complaint  Patient presents with  . Follow-up    6 Month follow up. Medications verbally reviewed with patient.       History of Present Illness: [name] [name] is a 86 y.o. male who presents for for a follow-up visit. He has extensive past medical history including chronic atrial fibrillation, aortic stenosis, HOCM, hyperlipidemia, hypertension, type 2 diabetes mellitus, sleep apnea, mitral regurgitation, and hypothyroidism.  He was evaluated at [facility] in mid 2019 in the setting of progressive dyspnea and HOCM.  Cardiac MRI showed significant LVH with left ventricular outflow tract obstruction along with severe aortic stenosis and severe mitral regurgitation.  Right and left heart cardiac catheterization showed nonobstructive CAD and normal cardiac index.  He  subsequently underwent successful septal myomectomy with mechanical aortic valve replacement and mechanical mitral valve replacement in August 2019.  Postoperative course complicated by anemia requiring transfusion as well as pleural effusion requiring thoracentesis.  He also had complete heart block which  required placement of a [employer] single lead permanent pacemaker.  He was discharged to rehabilitation and then in September 2019, he was admitted to [facility] regional in the setting of GI bleeding with supratherapeutic INR.  EGD showed duodenal ulcer with a visible vessel that was treated with hemo-spray.  Follow-up echocardiogram October 2019 showed normal LV function with appropriately functioning mechanical valves and improved pulmonary artery systolic pressure.  In February of 2020, he suffered from vestibular neuritis in the right ear with significant hearing loss and balance problems.  He was seen by ENT and treated with steroids but reports  minimal improvement.  He was also diagnosed with mediastinal lymphadenopathy but he is currently being monitored by oncology.  He has been following up with [facility].  Repeat echo in 2020 showed normal LV systolic function and normal functioning mitral and aortic mechanical valves.  PA pressure was mildly elevated at 39.  No significant change from before.    Has been doing well with no recent chest pain or shortness of breath.  No palpitations or dizziness.  He has some early memory loss and continues to have some problems with balance.  Hearing loss improved with adding a special hearing aid.   Past Medical History:  Diagnosis Date  . Aortic stenosis    a. 01/2016 echo: mod AS; b. 04/2018 Cardiac MRI: sev AS; c. 05/2018 s/p mech AVR @ time of myomectomy; d. 07/2018 Echo: Mild AS/AI.  [name] Atherosclerosis of aorta ([place])    by CT scan in past  . Atrial fibrillation ([place])    and aflutter. pt has a left atrial circuit that is not ablated. was on amiodarone-stopped, now use rate control.   . Bladder cancer ([place])    hx; treated with BCG in past   . Carotid artery disease ([place])    There was calcified plaque but no stenosis by carotid artery screening done at [facility] October, 2013  . CHB (complete heart block) ([place])    a. 05/2018 s/p BSX VVIR PPM ([facility]) in setting of bradycardia following myomectomy and AVR/MVR.  . Diabetes mellitus    type not specified  . Diabetic neuropathy (HCC)    feet  . Elevated liver enzymes    over time; hx  . Excessive sweating   .  Glaucoma   . Gout   . Head injury    when slipped on ice 2004-2005. stabilized and back on Coumadin   . Headache    migraines - distant past  . History of cardiac cath    a. 05/2018 Cath ([facility]): Non-obstructive CAD. Mildly elevated filling pressures w/ nl CI.  [name] HOCM (hypertrophic obstructive cardiomyopathy) ([place])    a. 01/2016 Echo: EF 65-70%, no rwma, LVOT gradient of 80-69mmHg, mod AS, SAM; b. 11/2017 Echo: EF 55-60%,  near cavity obliteration in systole, mod AS, mild MS; c. 04/2018 Card MRI ([facility]): Sev LVH, EF >70%, Sev AS, LVOT obs w/ Sev MR, mild to mod TR/PR, mid-myocardial HE in basal-mid anteroseptum and inferoseptum; d. 05/2018 s/p Septal myomectomy; e. 07/2018 Echo: EF 60-65%. PASP 46mmHg.  [name] Homocystinemia ([place])    elevated, mild   . Hypercholesterolemia    treated.   . Hypertension   . Infection of right inner ear   . Kidney mass    a. s/p laproscopic surgery woth cryoablation of a mass outside kidney followed at [facility]; b. 11/2018 CT Chest: 2cm R kidney mass.  . Mediastinal adenopathy    a. 12/208 CT Chest: up to 47mm LLL lung nodule. 1.5-1.7cm subcarinal/mediastinal adenopahty/right paratracheal lymph node; b. 11/2018 CT Chest: 2.5-3cm mediastinal adenopathy.  . Mitral regurgitation    a. 04/2018 Cardiac MRI: sev MR; b. 05/2018 s/p mech MVR @ time of myomectomy; c. 07/2018 Echo: Mild MS.  . Motion sickness    ocean boats  . Nausea   . Nonobstructive coronary artery disease    a. 05/2018 Cath ([facility]): nonobs CAD.  [name] Obstructive sleep apnea    CPAP started successfully 2014  . Orthostatic hypotension    a. orthostatic. dehydration. hospitalized 11/11  . Sleep apnea    Significant obstructive sleep apnea diagnosed in August, 2012, the patient is to receive CPAP   . Stroke [facility])    "2 old strokes" CT and MRI. [place] [facility] 11/11. diagnosis was dehydration, no acutal reports.   . TSH elevation    on synthroid historically   . Unsteady gait    August, 2012    Past Surgical History:  Procedure Laterality Date  . BLADDER SURGERY    . CATARACT EXTRACTION W/PHACO Left 05/11/2015   Procedure: CATARACT EXTRACTION PHACO AND INTRAOCULAR LENS PLACEMENT (IOC);  Surgeon: Leandrew Koyanagi, MD;  Location: [place];  Service: Ophthalmology;  Laterality: Left;  DIABETIC - oral meds, CPAP  . COLONOSCOPY WITH PROPOFOL N/A 04/14/2019   Procedure: COLONOSCOPY WITH PROPOFOL;  Surgeon: [name], MD;   Location[facility] ENDOSCOPY;  Service: Endoscopy;  Laterality: N/A;  . ESOPHAGOGASTRODUODENOSCOPY (EGD) WITH PROPOFOL N/A 07/01/2018   Procedure: ESOPHAGOGASTRODUODENOSCOPY (EGD) WITH PROPOFOL;  Surgeon: [name], MD;  Location: [facility] ENDOSCOPY;  Service: Endoscopy;  Laterality: N/A;  . INSERT / REPLACE / REMOVE PACEMAKER     Chemical engineer   . KIDNEY SURGERY     "froze mass"  . MECHANICAL AORTIC AND MITRAL VALVE REPLACEMENT  05/2018   [facility]   . TONSILLECTOMY    . VALVE REPLACEMENT       Current Outpatient Medications  Medication Sig Dispense Refill  . ALPRAZolam (XANAX) 0.25 MG tablet Take 1 tablet (0.25 mg total) by mouth every 6 (six) hours as needed for anxiety. 60 tablet 0  . aspirin EC 81 MG tablet Take 1 tablet (81 mg total) by mouth daily.    [name] glucose blood (ONE TOUCH ULTRA TEST) test strip USE ONE  STRIP TO CHECK GLUCOSE ONCE DAILY 100 each 12  . Lancets (ONETOUCH DELICA PLUS [id number]) MISC USE 1  TO CHECK GLUCOSE ONCE DAILY 100 each 11  . latanoprost (XALATAN) 0.005 % ophthalmic solution Place 1 drop into both eyes at bedtime.     [name] levothyroxine (SYNTHROID) 50 MCG tablet Take 1 tablet (50 mcg total) by mouth daily. 90 tablet 3  . meclizine (ANTIVERT) 12.5 MG tablet Take 1 tablet (12.5 mg total) by mouth 3 (three) times daily as needed for dizziness. (Patient taking differently: Take 12.5 mg by mouth 3 (three) times daily as needed for dizziness. takes 1/2 tab every morning) 90 tablet 3  . metFORMIN (GLUCOPHAGE) 1000 MG tablet Take 1 tablet by mouth once daily with breakfast 90 tablet 1  . metoprolol tartrate (LOPRESSOR) 25 MG tablet Take 1/2 (one-half) tablet by mouth twice daily 90 tablet 0  . ONETOUCH DELICA LANCETS 83T MISC USE ONE LANCET TO CHECK BLOOD GLUCOSE ONCE DAILY 100 each 12  . ONETOUCH ULTRA test strip USE 1 STRIP TO CHECK GLUCOSE ONCE DAILY 50 each 11  . pantoprazole (PROTONIX) 40 MG tablet Take 1 tablet by mouth twice daily 180 tablet 2  . simvastatin (ZOCOR)  40 MG tablet TAKE 1/2 (ONE-HALF) TABLET BY MOUTH AT BEDTIME 45 tablet 3  . warfarin (COUMADIN) 3 MG tablet TAKE 1 & 1/2 (ONE & ONE-HALF) TABLETS BY MOUTH ONCE DAILY EXCEPT  1  TABLET  ON  MONDAY  AND  FRIDAY  OR  AS  DIRECTED  BY  COUMADIN  CLINIC 60 tablet 0   No current facility-administered medications for this visit.    Allergies:   Macrolides and ketolides, Mycinettes, Nitrofuran derivatives, Nitrofurantoin, Erythromycin, Zofran [ondansetron hcl-dextrose], and Zofran [ondansetron hcl]    Social History:  The patient  reports that he quit smoking about 45 years ago. His smoking use included cigars and cigarettes. He has never used smokeless tobacco. He reports that he does not drink alcohol and does not use drugs.   Family History:  The patient's family history includes Arrhythmia in his brother and father; Breast cancer in his mother; Prostate cancer in his father; Stroke in his father.    ROS:  Please see the history of present illness.   Otherwise, review of systems are positive for none.   All other systems are reviewed and negative.    PHYSICAL EXAM: VS:  BP 128/62 (BP Location: Left Arm, Patient Position: Sitting, Cuff Size: Normal)   Ht 5\' 7"  (1.702 m)   Wt 189 lb (85.7 kg)   SpO2 94%   BMI 29.60 kg/m  , BMI Body mass index is 29.6 kg/m. GEN: Well nourished, well developed, in no acute distress  HEENT: normal  Neck: no JVD, carotid bruits, or masses Cardiac: Regular rate and rhythm; no rubs, or gallops,no edema .  Normal mechanical heart sound. Respiratory:  clear to auscultation bilaterally, normal work of breathing GI: soft, nontender, nondistended, + BS MS: no deformity or atrophy  Skin: warm and dry, no rash Neuro:  Strength and sensation are intact Psych: euthymic mood, full affect   EKG:  EKG is ordered today. The ekg ordered today demonstrates ventricular paced rhythm with underlying atrial fibrillation.   Recent Labs: 07/30/2019: TSH 3.300 06/08/2020: ALT  17; BUN 16; Creatinine, Ser 1.13; Hemoglobin 13.2; Platelets 188; Potassium 4.3; Sodium 138    Lipid Panel    Component Value Date/Time   CHOL 142 07/30/2019 0956   TRIG 97 07/30/2019 0956  HDL 39 (L) 07/30/2019 0956   CHOLHDL 3.6 07/30/2019 0956   LDLCALC 85 07/30/2019 0956      Wt Readings from Last 3 Encounters:  06/09/20 189 lb (85.7 kg)  06/08/20 188 lb (85.3 kg)  12/14/19 195 lb (88.5 kg)        ASSESSMENT AND PLAN:   1.  Hypertrophic obstructive cardiomyopathy: Status post septal myomectomy at [facility] in 2019 with mechanical mitral and aortic valve replacements.  Doing well overall with no symptoms of heart failure.  2.  Valvular heart disease: Status post mechanical mitral and aortic valve replacements.  He is chronically anticoagulated with Coumadin and followed closely in our Coumadin clinic.  Normal functioning valves on echo in October 2019.  Continue low-dose aspirin as well.  3.  Complete heart block: This developed following septal myomectomy.  He is status post single lead Product/process development scientist and this is followed at [facility].  Most recent device check in June showed normal function and 88% ventricular pacing.  4.  Permanent atrial fibrillation: Rate controlled on beta-blocker therapy.  Anticoagulated with Coumadin.  5.  Essential hypertension: Blood pressure is controlled on small dose metoprolol.  He is no longer on amlodipine.  5.  Nonobstructive CAD: Minimal CAD on catheterization in August 2019.  Continue aspirin, beta-blocker, and statin therapy.  7.  Hyperlipidemia: He remains on statin therapy.    Disposition:   FU with me in 6 months  Signed,  Kathlyn Sacramento, MD  06/09/2020 2:36 PM    [facility]

## 2020-06-10 NOTE — Progress Notes (Signed)
I,[name],acting as a scribe for [name], MD.,have documented all relevant documentation on the behalf of [name], MD,as directed by  [name], MD while in the presence of [name], MD.   Established patient visit   Patient: [name] [name]   DOB: 07-May-1934   86 y.o. Male  MRN: [id number] Visit Date: 06/15/2020  Today's healthcare provider: [name], MD   Chief Complaint  Patient presents with  . Atrial Fibrillation  . Diabetes  . Follow-up  . Hyperlipidemia  . Hypertension   Subjective    HPI  Overall patient has been doing fairly well.  His lymphoma is stable.  His wife is worried about his cognition.  He repeats himself frequently.  She does not want him to drive and he is agreeable to not driving today. Diabetes Mellitus Type II, follow-up  Lab Results  Component Value Date   HGBA1C 6.2 (A) 06/15/2020   HGBA1C 6.6 (A) 12/14/2019   HGBA1C 6.7 (H) 07/30/2019   Last seen for diabetes 6 months ago.  Management since then includes continuing the same treatment. He reports good compliance with treatment. He is not having side effects. none  Home blood sugar records: fasting range: 110-150  Episodes of hypoglycemia? No none   Current insulin regiment: n/a Most Recent Eye Exam: 12/18/2019  --------------------------------------------------------------------  Hypertension, follow-up  BP Readings from Last 3 Encounters:  06/15/20 113/68  06/09/20 128/62  06/08/20 (!) 111/56   Wt Readings from Last 3 Encounters:  06/15/20 191 lb (86.6 kg)  06/09/20 189 lb (85.7 kg)  06/08/20 188 lb (85.3 kg)     He was last seen for hypertension 6 months ago.  BP at that visit was 115/66. Management since that visit includes; On amlodipine, metoprolol. He reports good compliance with treatment. He is not having side effects. none He is not exercising. He is adherent to low salt diet.   Outside blood pressures are  not checking.  He does not smoke.  Use of agents associated with hypertension: none.   --------------------------------------------------------------------  Lipid/Cholesterol, follow-up  Last Lipid Panel: Lab Results  Component Value Date   CHOL 142 07/30/2019   [place] 85 07/30/2019   HDL 39 (L) 07/30/2019   TRIG 97 07/30/2019    He was last seen for this 07/30/2019.  Management since that visit includes; labs checked showing-stable. He reports good compliance with treatment. He is not having side effects. none He is following a Low Sodium diet. Current exercise: none  Last metabolic panel Lab Results  Component Value Date   GLUCOSE 183 (H) 06/08/2020   NA 138 06/08/2020   K 4.3 06/08/2020   BUN 16 06/08/2020   CREATININE 1.13 06/08/2020   GFRNONAA 59 (L) 06/08/2020   GFRAA >60 06/08/2020   CALCIUM 9.0 06/08/2020   AST 28 06/08/2020   ALT 17 06/08/2020   The ASCVD Risk score Mikey Bussing DC Jr., et al., 2013) failed to calculate for the following reasons:   The 2013 ASCVD risk score is only valid for ages 73 to 35   The patient has a prior MI or stroke diagnosis  --------------------------------------------------------------------  Coronary artery disease without angina pectoris, unspecified vessel or lesion type, unspecified whether native or transplanted heart All risk factors treated  Pacemaker From 12/14/2019-Pacemaker for heart block.  Atrial fibrillation and flutter (HCC) From 12/14/2019-On Coumadin daily.       Medications: Outpatient Medications Prior to Visit  Medication Sig  . ALPRAZolam (  XANAX) 0.25 MG tablet Take 1 tablet (0.25 mg total) by mouth every 6 (six) hours as needed for anxiety.  [name] aspirin EC 81 MG tablet Take 1 tablet (81 mg total) by mouth daily.  [name] glucose blood (ONE TOUCH ULTRA TEST) test strip USE ONE STRIP TO CHECK GLUCOSE ONCE DAILY  . Lancets (ONETOUCH DELICA PLUS [id number]) MISC USE 1  TO CHECK GLUCOSE ONCE DAILY  .  latanoprost (XALATAN) 0.005 % ophthalmic solution Place 1 drop into both eyes at bedtime.   [name] levothyroxine (SYNTHROID) 50 MCG tablet Take 1 tablet (50 mcg total) by mouth daily.  . meclizine (ANTIVERT) 12.5 MG tablet Take 1 tablet (12.5 mg total) by mouth 3 (three) times daily as needed for dizziness. (Patient taking differently: Take 12.5 mg by mouth 3 (three) times daily as needed for dizziness. takes 1/2 tablet 1-2 times daily)  . metFORMIN (GLUCOPHAGE) 1000 MG tablet Take 1 tablet by mouth once daily with breakfast  . metoprolol tartrate (LOPRESSOR) 25 MG tablet Take 1/2 (one-half) tablet by mouth twice daily  . ONETOUCH DELICA LANCETS 99J MISC USE ONE LANCET TO CHECK BLOOD GLUCOSE ONCE DAILY  . ONETOUCH ULTRA test strip USE 1 STRIP TO CHECK GLUCOSE ONCE DAILY  . pantoprazole (PROTONIX) 40 MG tablet Take 1 tablet by mouth twice daily (Patient taking differently: Take 40 mg by mouth daily. Takes one tablet daily)  . simvastatin (ZOCOR) 40 MG tablet TAKE 1/2 (ONE-HALF) TABLET BY MOUTH AT BEDTIME  . warfarin (COUMADIN) 3 MG tablet TAKE 1 & 1/2 (ONE & ONE-HALF) TABLETS BY MOUTH ONCE DAILY EXCEPT  1  TABLET  ON  MONDAY  AND  FRIDAY  OR  AS  DIRECTED  BY  COUMADIN  CLINIC   No facility-administered medications prior to visit.    Review of Systems  Constitutional: Negative for appetite change, chills and fever.  Respiratory: Negative for chest tightness, shortness of breath and wheezing.   Cardiovascular: Negative for chest pain and palpitations.  Gastrointestinal: Negative for abdominal pain, nausea and vomiting.    Last hemoglobin A1c Lab Results  Component Value Date   HGBA1C 6.2 (A) 06/15/2020      Objective    BP 113/68 (BP Location: Right Arm, Patient Position: Sitting, Cuff Size: Large)   Pulse 73   Temp 98.4 F (36.9 C) (Oral)   Resp 18   Ht 5\' 7"  (1.702 m)   Wt 191 lb (86.6 kg)   SpO2 98%   BMI 29.91 kg/m  BP Readings from Last 3 Encounters:  06/15/20 113/68  06/09/20  128/62  06/08/20 (!) 111/56   Wt Readings from Last 3 Encounters:  06/15/20 191 lb (86.6 kg)  06/09/20 189 lb (85.7 kg)  06/08/20 188 lb (85.3 kg)      Depression screen [facility] 2/9 06/15/2020 04/01/2019 01/19/2019  Decreased Interest 0 0 0  Down, Depressed, Hopeless 0 0 0  PHQ - 2 Score 0 0 0  Altered sleeping 0 0 -  Tired, decreased energy 0 0 -  Change in appetite 0 2 -  Feeling bad or failure about yourself  0 1 -  Trouble concentrating 0 0 -  Moving slowly or fidgety/restless 0 0 -  Suicidal thoughts 0 0 -  PHQ-9 Score 0 3 -  Difficult doing work/chores Not difficult at all Not difficult at all -  Some recent data might be hidden   MMSE - Mini Mental State Exam 06/15/2020 04/01/2019 11/11/2017  Orientation to time 5 5 4  Orientation to Place 5 5 5   Registration 3 3 3   Attention/ Calculation 5 5 5   Recall 3 3 0  Language- name 2 objects 2 2 2   Language- repeat 1 1 1   Language- follow 3 step command 3 2 3   Language- read & follow direction 1 1 1   Write a sentence 1 1 1   Copy design 1 1 1   Total score 30 29 26     Physical Exam Vitals reviewed.  Constitutional:      Appearance: He is well-developed.  HENT:     Head: Normocephalic and atraumatic.     Right Ear: External ear normal.     Left Ear: External ear normal.     Nose: Nose normal.  Eyes:     General: No scleral icterus.    Conjunctiva/sclera: Conjunctivae normal.  Neck:     Thyroid: No thyromegaly.     Vascular: No JVD.  Cardiovascular:     Rate and Rhythm: Normal rate and regular rhythm.     Heart sounds: Normal heart sounds.  Pulmonary:     Effort: Pulmonary effort is normal.     Breath sounds: Normal breath sounds.  Abdominal:     Palpations: Abdomen is soft.  Musculoskeletal:        General: No swelling.     Right lower leg: No edema.     Left lower leg: No edema.  Skin:    General: Skin is warm and dry.  Neurological:     General: No focal deficit present.     Mental Status: He is alert and  oriented to person, place, and time. Mental status is at baseline.  Psychiatric:        Mood and Affect: Mood normal.        Behavior: Behavior normal.        Thought Content: Thought content normal.        Judgment: Judgment normal.       Results for orders placed or performed in visit on 06/15/20  POCT glycosylated hemoglobin (Hb A1C)  Result Value Ref Range   Hemoglobin A1C 6.2 (A) 4.0 - 5.6 %   Est. average glucose Bld gHb Est-mCnc 131     Assessment & Plan     1. Type 2 diabetes mellitus without complication, without long-term current use of insulin (HCC) A1c is good at 6.2.  He is on Metformin only. - POCT glycosylated hemoglobin (Hb A1C)  2. Hypertension, unspecified type Good control.  3. Coronary artery disease without angina pectoris, unspecified vessel or lesion type, unspecified whether native or transplanted heart All risk factors treated  4. Atrial fibrillation and flutter (HCC) On Coumadin  5. Mitral and aortic incompetence   6. HOCM (hypertrophic obstructive cardiomyopathy) ([place]) Status post myomectomy  7. Heart block AV third degree ([place]) Pacemaker  8. Chronic atrial fibrillation (HCC)   9. Obstructive sleep apnea On CPAP  10. History of neoplasm of bladder   11. H/O aortic valve replacement   12. Pacemaker    No follow-ups on file.      I, [name], MD, have reviewed all documentation for this visit. The documentation on 06/28/20 for the exam, diagnosis, procedures, and orders are all accurate and complete.    Armond Cuthrell Cranford Mon, MD  [facility] [phone] (phone) [phone] (fax)  [place]

## 2020-06-15 ENCOUNTER — Ambulatory Visit (INDEPENDENT_AMBULATORY_CARE_PROVIDER_SITE_OTHER): Payer: PPO | Admitting: Family Medicine

## 2020-06-15 ENCOUNTER — Encounter: Payer: Self-pay | Admitting: Family Medicine

## 2020-06-15 ENCOUNTER — Other Ambulatory Visit: Payer: Self-pay

## 2020-06-15 VITALS — BP 113/68 | HR 73 | Temp 98.4°F | Resp 18 | Ht 67.0 in | Wt 191.0 lb

## 2020-06-15 DIAGNOSIS — I421 Obstructive hypertrophic cardiomyopathy: Secondary | ICD-10-CM

## 2020-06-15 DIAGNOSIS — I442 Atrioventricular block, complete: Secondary | ICD-10-CM | POA: Diagnosis not present

## 2020-06-15 DIAGNOSIS — I10 Essential (primary) hypertension: Secondary | ICD-10-CM

## 2020-06-15 DIAGNOSIS — I2510 Atherosclerotic heart disease of native coronary artery without angina pectoris: Secondary | ICD-10-CM | POA: Diagnosis not present

## 2020-06-15 DIAGNOSIS — I482 Chronic atrial fibrillation, unspecified: Secondary | ICD-10-CM | POA: Diagnosis not present

## 2020-06-15 DIAGNOSIS — Z8603 Personal history of neoplasm of uncertain behavior: Secondary | ICD-10-CM | POA: Diagnosis not present

## 2020-06-15 DIAGNOSIS — Z950 Presence of cardiac pacemaker: Secondary | ICD-10-CM

## 2020-06-15 DIAGNOSIS — E119 Type 2 diabetes mellitus without complications: Secondary | ICD-10-CM | POA: Diagnosis not present

## 2020-06-15 DIAGNOSIS — I080 Rheumatic disorders of both mitral and aortic valves: Secondary | ICD-10-CM

## 2020-06-15 DIAGNOSIS — I4891 Unspecified atrial fibrillation: Secondary | ICD-10-CM

## 2020-06-15 DIAGNOSIS — Z952 Presence of prosthetic heart valve: Secondary | ICD-10-CM | POA: Diagnosis not present

## 2020-06-15 DIAGNOSIS — I4892 Unspecified atrial flutter: Secondary | ICD-10-CM

## 2020-06-15 DIAGNOSIS — G4733 Obstructive sleep apnea (adult) (pediatric): Secondary | ICD-10-CM

## 2020-06-15 DIAGNOSIS — H401131 Primary open-angle glaucoma, bilateral, mild stage: Secondary | ICD-10-CM | POA: Diagnosis not present

## 2020-06-15 LAB — POCT GLYCOSYLATED HEMOGLOBIN (HGB A1C)
Est. average glucose Bld gHb Est-mCnc: 131
Hemoglobin A1C: 6.2 % — AB (ref 4.0–5.6)

## 2020-06-22 ENCOUNTER — Ambulatory Visit (INDEPENDENT_AMBULATORY_CARE_PROVIDER_SITE_OTHER): Payer: PPO

## 2020-06-22 ENCOUNTER — Other Ambulatory Visit: Payer: Self-pay

## 2020-06-22 DIAGNOSIS — Z5181 Encounter for therapeutic drug level monitoring: Secondary | ICD-10-CM

## 2020-06-22 DIAGNOSIS — I482 Chronic atrial fibrillation, unspecified: Secondary | ICD-10-CM

## 2020-06-22 DIAGNOSIS — I4892 Unspecified atrial flutter: Secondary | ICD-10-CM

## 2020-06-22 DIAGNOSIS — I4891 Unspecified atrial fibrillation: Secondary | ICD-10-CM

## 2020-06-22 DIAGNOSIS — Z950 Presence of cardiac pacemaker: Secondary | ICD-10-CM

## 2020-06-22 DIAGNOSIS — Z952 Presence of prosthetic heart valve: Secondary | ICD-10-CM | POA: Diagnosis not present

## 2020-06-22 LAB — POCT INR: INR: 3.2 — AB (ref 2.0–3.0)

## 2020-06-22 MED ORDER — WARFARIN SODIUM 3 MG PO TABS: ORAL_TABLET | ORAL | 0 refills | Status: DC

## 2020-06-22 NOTE — Patient Instructions (Signed)
-   continue Warfarin 1.5 tablets every day EXCEPT 1 tablet on [place]. - Recheck INR in 6 weeks.

## 2020-06-23 ENCOUNTER — Ambulatory Visit: Payer: Self-pay

## 2020-06-23 DIAGNOSIS — H401131 Primary open-angle glaucoma, bilateral, mild stage: Secondary | ICD-10-CM | POA: Diagnosis not present

## 2020-06-28 ENCOUNTER — Other Ambulatory Visit: Payer: Self-pay

## 2020-06-28 ENCOUNTER — Other Ambulatory Visit: Payer: Self-pay | Admitting: Family Medicine

## 2020-06-28 ENCOUNTER — Telehealth: Payer: Self-pay

## 2020-06-28 DIAGNOSIS — E039 Hypothyroidism, unspecified: Secondary | ICD-10-CM

## 2020-06-28 MED ORDER — WARFARIN SODIUM 3 MG PO TABS
ORAL_TABLET | ORAL | 4 refills | Status: DC
Start: 2020-06-28 — End: 2020-10-26

## 2020-06-28 NOTE — Telephone Encounter (Signed)
Patients wife calling in wanting to discuss AReds 2  presser vision for a new diagnosis of macular degeneration. Patients wife is wondering if there may be any issues or contraindications with coumadin and any other medication   Please advise when able

## 2020-06-28 NOTE — Telephone Encounter (Signed)
Please review for refill. Thanks!  

## 2020-06-28 NOTE — Telephone Encounter (Signed)
Ok to take Areds 2 Preservision, no issues with warfarin or other meds. Returned call to pt's wife and advised her of this, she had no further questions.

## 2020-06-29 ENCOUNTER — Other Ambulatory Visit: Payer: Self-pay | Admitting: Family Medicine

## 2020-06-29 DIAGNOSIS — E119 Type 2 diabetes mellitus without complications: Secondary | ICD-10-CM

## 2020-06-29 MED ORDER — METFORMIN HCL 1000 MG PO TABS: 1000.0000 mg | ORAL_TABLET | Freq: Every day | ORAL | 1 refills | Status: DC

## 2020-06-29 NOTE — Telephone Encounter (Signed)
Medication sent into the pharmacy. 

## 2020-06-29 NOTE — Telephone Encounter (Signed)
[place] faxed refill request for the following medications: [place]  metFORMIN (GLUCOPHAGE) 1000 MG tablet  Please advise. Thanks, [employer]

## 2020-06-30 ENCOUNTER — Ambulatory Visit (INDEPENDENT_AMBULATORY_CARE_PROVIDER_SITE_OTHER): Payer: PPO

## 2020-06-30 ENCOUNTER — Other Ambulatory Visit: Payer: Self-pay

## 2020-06-30 DIAGNOSIS — Z23 Encounter for immunization: Secondary | ICD-10-CM | POA: Diagnosis not present

## 2020-07-07 DIAGNOSIS — E119 Type 2 diabetes mellitus without complications: Secondary | ICD-10-CM | POA: Diagnosis not present

## 2020-07-07 DIAGNOSIS — B351 Tinea unguium: Secondary | ICD-10-CM | POA: Diagnosis not present

## 2020-07-11 DIAGNOSIS — Z950 Presence of cardiac pacemaker: Secondary | ICD-10-CM | POA: Diagnosis not present

## 2020-07-14 DIAGNOSIS — Z950 Presence of cardiac pacemaker: Secondary | ICD-10-CM | POA: Diagnosis not present

## 2020-07-18 ENCOUNTER — Telehealth: Payer: Self-pay

## 2020-07-18 NOTE — Telephone Encounter (Signed)
Patients wife is calling.  Patients wife would like to know if it is safe for [place] to get the COVID booster shot.

## 2020-07-18 NOTE — Telephone Encounter (Signed)
I spoke with the patient's wife (ok per DPR), and have given her Dr. Tyrell Antonio recommendations.   She voices understanding and was very appreciative for the call back.

## 2020-07-18 NOTE — Telephone Encounter (Signed)
Yes, it is safe.

## 2020-07-28 DIAGNOSIS — G4733 Obstructive sleep apnea (adult) (pediatric): Secondary | ICD-10-CM | POA: Diagnosis not present

## 2020-08-03 ENCOUNTER — Other Ambulatory Visit: Payer: Self-pay

## 2020-08-03 ENCOUNTER — Ambulatory Visit: Payer: PPO

## 2020-08-03 DIAGNOSIS — I482 Chronic atrial fibrillation, unspecified: Secondary | ICD-10-CM | POA: Diagnosis not present

## 2020-08-03 DIAGNOSIS — Z952 Presence of prosthetic heart valve: Secondary | ICD-10-CM | POA: Diagnosis not present

## 2020-08-03 DIAGNOSIS — I4892 Unspecified atrial flutter: Secondary | ICD-10-CM

## 2020-08-03 DIAGNOSIS — Z5181 Encounter for therapeutic drug level monitoring: Secondary | ICD-10-CM | POA: Diagnosis not present

## 2020-08-03 DIAGNOSIS — Z950 Presence of cardiac pacemaker: Secondary | ICD-10-CM | POA: Diagnosis not present

## 2020-08-03 DIAGNOSIS — I4891 Unspecified atrial fibrillation: Secondary | ICD-10-CM | POA: Diagnosis not present

## 2020-08-03 LAB — POCT INR: INR: 2.4 (ref 2.0–3.0)

## 2020-08-03 NOTE — Patient Instructions (Signed)
-   take 2 whole tablets today, then   - continue Warfarin 1.5 tablets every day EXCEPT 1 tablet on [place]. - Recheck INR in 6 weeks.

## 2020-08-06 ENCOUNTER — Other Ambulatory Visit: Payer: Self-pay | Admitting: Family Medicine

## 2020-08-06 NOTE — Telephone Encounter (Signed)
Requested Prescriptions  Pending Prescriptions Disp Refills  . Lancets (ONETOUCH DELICA PLUS [id number]) [place] [Pharmacy Med Name: ONETOUCH DELICA LAN 92N MIS] 639 each 6    Sig: USE 1  TO CHECK GLUCOSE ONCE DAILY     Endocrinology: Diabetes - Testing Supplies Passed - 08/06/2020  3:01 PM      Passed - Valid encounter within last 12 months    Recent Outpatient Visits          1 month ago Type 2 diabetes mellitus without complication, without long-term current use of insulin [facility])   [facility] [name], MD   7 months ago Type 2 diabetes mellitus without complication, without long-term current use of insulin [facility])   [facility] [name], MD   1 year ago Type 2 diabetes mellitus without complication, without long-term current use of insulin [facility])   [facility] [name], MD   1 year ago Chronic atrial fibrillation   [facility] [name], MD   1 year ago Type 2 diabetes mellitus without complication, without long-term current use of insulin [facility])   [facility] [name], MD      Future Appointments            In 2 months [name], MD [facility] [place]

## 2020-08-24 ENCOUNTER — Ambulatory Visit (INDEPENDENT_AMBULATORY_CARE_PROVIDER_SITE_OTHER): Payer: PPO | Admitting: Urology

## 2020-08-24 ENCOUNTER — Other Ambulatory Visit: Payer: Self-pay

## 2020-08-24 ENCOUNTER — Other Ambulatory Visit: Payer: Self-pay | Admitting: Urology

## 2020-08-24 VITALS — BP 165/72 | HR 103 | Ht 69.0 in | Wt 185.0 lb

## 2020-08-24 DIAGNOSIS — Z8551 Personal history of malignant neoplasm of bladder: Secondary | ICD-10-CM

## 2020-08-24 NOTE — Progress Notes (Signed)
   08/24/20  CC:  Chief Complaint  Patient presents with  . Cysto    HPI:  Urothelial carcinoma in situ 2002  Induction BCG  No recurrences  Presents today for surveillance cystoscopy and has no complaints  Blood pressure (!) 165/72, pulse (!) 103, height 5\' 9"  (1.753 m), weight 185 lb (83.9 kg). NED. A&Ox3.   No respiratory distress   Abd soft, NT, ND Normal phallus with bilateral descended testicles  Cystoscopy Procedure Note  Patient identification was confirmed, informed consent was obtained, and patient was prepped using Betadine solution.  Lidocaine jelly was administered per urethral meatus.     Pre-Procedure: - Inspection reveals a normal caliber urethral meatus.  Procedure: The flexible cystoscope was introduced without difficulty - No urethral strictures/lesions are present. - Moderate lateral enlargement prostate  - Normal bladder neck - Bilateral ureteral orifices identified - Bladder mucosa  reveals no ulcers, tumors, or lesions - No bladder stones -Mild trabeculation  Retroflexion shows no abnormalities   Post-Procedure: - Patient tolerated the procedure well  Assessment/ Plan:  No mucosal abnormalities noted on cystoscopy  Urine cytology sent  Follow-up 1 year   Abbie Sons, MD

## 2020-08-25 ENCOUNTER — Encounter: Payer: Self-pay | Admitting: Urology

## 2020-08-25 LAB — URINALYSIS, COMPLETE
Bilirubin, UA: NEGATIVE
Glucose, UA: NEGATIVE
Ketones, UA: NEGATIVE
Leukocytes,UA: NEGATIVE
Nitrite, UA: NEGATIVE
Protein,UA: NEGATIVE
RBC, UA: NEGATIVE
Specific Gravity, UA: 1.025 (ref 1.005–1.030)
Urobilinogen, Ur: 0.2 mg/dL (ref 0.2–1.0)
pH, UA: 5.5 (ref 5.0–7.5)

## 2020-08-25 LAB — MICROSCOPIC EXAMINATION: Bacteria, UA: NONE SEEN

## 2020-08-26 ENCOUNTER — Encounter: Payer: Self-pay | Admitting: Urology

## 2020-08-26 LAB — CYTOLOGY - NON PAP

## 2020-08-31 ENCOUNTER — Other Ambulatory Visit: Payer: Self-pay | Admitting: Family Medicine

## 2020-08-31 DIAGNOSIS — E119 Type 2 diabetes mellitus without complications: Secondary | ICD-10-CM

## 2020-09-13 DIAGNOSIS — Z872 Personal history of diseases of the skin and subcutaneous tissue: Secondary | ICD-10-CM | POA: Diagnosis not present

## 2020-09-13 DIAGNOSIS — L578 Other skin changes due to chronic exposure to nonionizing radiation: Secondary | ICD-10-CM | POA: Diagnosis not present

## 2020-09-13 DIAGNOSIS — L821 Other seborrheic keratosis: Secondary | ICD-10-CM | POA: Diagnosis not present

## 2020-09-13 DIAGNOSIS — L570 Actinic keratosis: Secondary | ICD-10-CM | POA: Diagnosis not present

## 2020-09-13 DIAGNOSIS — Z86018 Personal history of other benign neoplasm: Secondary | ICD-10-CM | POA: Diagnosis not present

## 2020-09-14 ENCOUNTER — Ambulatory Visit: Payer: PPO

## 2020-09-14 ENCOUNTER — Other Ambulatory Visit: Payer: Self-pay

## 2020-09-14 DIAGNOSIS — Z950 Presence of cardiac pacemaker: Secondary | ICD-10-CM

## 2020-09-14 DIAGNOSIS — Z952 Presence of prosthetic heart valve: Secondary | ICD-10-CM | POA: Diagnosis not present

## 2020-09-14 DIAGNOSIS — Z5181 Encounter for therapeutic drug level monitoring: Secondary | ICD-10-CM | POA: Diagnosis not present

## 2020-09-14 DIAGNOSIS — I4892 Unspecified atrial flutter: Secondary | ICD-10-CM | POA: Diagnosis not present

## 2020-09-14 DIAGNOSIS — I482 Chronic atrial fibrillation, unspecified: Secondary | ICD-10-CM

## 2020-09-14 DIAGNOSIS — I4891 Unspecified atrial fibrillation: Secondary | ICD-10-CM

## 2020-09-14 LAB — POCT INR: INR: 3.2 — AB (ref 2.0–3.0)

## 2020-09-14 NOTE — Patient Instructions (Signed)
-   continue Warfarin 1.5 tablets every day EXCEPT 1 tablet on [place]. - Recheck INR in 6 weeks.

## 2020-09-22 DIAGNOSIS — I10 Essential (primary) hypertension: Secondary | ICD-10-CM | POA: Diagnosis not present

## 2020-09-22 DIAGNOSIS — Z950 Presence of cardiac pacemaker: Secondary | ICD-10-CM | POA: Diagnosis not present

## 2020-09-22 DIAGNOSIS — Z45018 Encounter for adjustment and management of other part of cardiac pacemaker: Secondary | ICD-10-CM | POA: Diagnosis not present

## 2020-09-22 DIAGNOSIS — I4821 Permanent atrial fibrillation: Secondary | ICD-10-CM | POA: Diagnosis not present

## 2020-09-22 DIAGNOSIS — E119 Type 2 diabetes mellitus without complications: Secondary | ICD-10-CM | POA: Diagnosis not present

## 2020-09-27 ENCOUNTER — Other Ambulatory Visit: Payer: Self-pay | Admitting: Cardiovascular Disease

## 2020-10-10 DIAGNOSIS — B351 Tinea unguium: Secondary | ICD-10-CM | POA: Diagnosis not present

## 2020-10-10 DIAGNOSIS — E119 Type 2 diabetes mellitus without complications: Secondary | ICD-10-CM | POA: Diagnosis not present

## 2020-10-11 ENCOUNTER — Other Ambulatory Visit: Payer: Self-pay | Admitting: Family Medicine

## 2020-10-11 DIAGNOSIS — E039 Hypothyroidism, unspecified: Secondary | ICD-10-CM

## 2020-10-24 ENCOUNTER — Ambulatory Visit (INDEPENDENT_AMBULATORY_CARE_PROVIDER_SITE_OTHER): Payer: PPO | Admitting: Family Medicine

## 2020-10-24 ENCOUNTER — Encounter: Payer: Self-pay | Admitting: Family Medicine

## 2020-10-24 ENCOUNTER — Other Ambulatory Visit: Payer: Self-pay

## 2020-10-24 VITALS — BP 129/54 | HR 71 | Temp 98.3°F | Wt 189.0 lb

## 2020-10-24 DIAGNOSIS — I10 Essential (primary) hypertension: Secondary | ICD-10-CM

## 2020-10-24 DIAGNOSIS — I4892 Unspecified atrial flutter: Secondary | ICD-10-CM

## 2020-10-24 DIAGNOSIS — I700 Atherosclerosis of aorta: Secondary | ICD-10-CM | POA: Diagnosis not present

## 2020-10-24 DIAGNOSIS — E291 Testicular hypofunction: Secondary | ICD-10-CM

## 2020-10-24 DIAGNOSIS — R2681 Unsteadiness on feet: Secondary | ICD-10-CM

## 2020-10-24 DIAGNOSIS — I4891 Unspecified atrial fibrillation: Secondary | ICD-10-CM | POA: Diagnosis not present

## 2020-10-24 DIAGNOSIS — I421 Obstructive hypertrophic cardiomyopathy: Secondary | ICD-10-CM | POA: Diagnosis not present

## 2020-10-24 DIAGNOSIS — D090 Carcinoma in situ of bladder: Secondary | ICD-10-CM

## 2020-10-24 DIAGNOSIS — I2510 Atherosclerotic heart disease of native coronary artery without angina pectoris: Secondary | ICD-10-CM | POA: Diagnosis not present

## 2020-10-24 DIAGNOSIS — E039 Hypothyroidism, unspecified: Secondary | ICD-10-CM | POA: Diagnosis not present

## 2020-10-24 DIAGNOSIS — R590 Localized enlarged lymph nodes: Secondary | ICD-10-CM | POA: Diagnosis not present

## 2020-10-24 DIAGNOSIS — E1142 Type 2 diabetes mellitus with diabetic polyneuropathy: Secondary | ICD-10-CM

## 2020-10-24 DIAGNOSIS — G3184 Mild cognitive impairment, so stated: Secondary | ICD-10-CM | POA: Diagnosis not present

## 2020-10-24 DIAGNOSIS — Z950 Presence of cardiac pacemaker: Secondary | ICD-10-CM

## 2020-10-24 DIAGNOSIS — E7800 Pure hypercholesterolemia, unspecified: Secondary | ICD-10-CM | POA: Diagnosis not present

## 2020-10-24 DIAGNOSIS — C649 Malignant neoplasm of unspecified kidney, except renal pelvis: Secondary | ICD-10-CM

## 2020-10-24 DIAGNOSIS — I442 Atrioventricular block, complete: Secondary | ICD-10-CM

## 2020-10-24 NOTE — Progress Notes (Signed)
Established patient visit   Patient: [name] [name]   DOB: 11-Oct-1934   87 y.o. Male  MRN: [id number] Visit Date: 10/24/2020  Today's healthcare provider: [name], MD   Chief Complaint  Patient presents with  . Hypertension  . Hyperlipidemia  . Diabetes   Subjective    HPI  Patient is clinically stable.  He is a little stronger than he was a year ago but his memory is starting to slip. In recent months he has had some mild problems with urine and fecal incontinence.  No other GI or GU symptoms. Diabetes Mellitus Type II, follow-up  Lab Results  Component Value Date   HGBA1C 6.2 (A) 06/15/2020   HGBA1C 6.6 (A) 12/14/2019   HGBA1C 6.7 (H) 07/30/2019   Last seen for diabetes 3 months ago.  Management since then includes continuing the same treatment. He reports excellent compliance with treatment. He is not having side effects.   Home blood sugar records: fasting range: 100-110's  Episodes of hypoglycemia? No    Current insulin regiment: none Most Recent Eye Exam: 12/18/2019  --------------------------------------------------------------------------------------------------- Hypertension, follow-up  BP Readings from Last 3 Encounters:  10/24/20 (!) 129/54  08/24/20 (!) 165/72  06/15/20 113/68   Wt Readings from Last 3 Encounters:  10/24/20 189 lb (85.7 kg)  08/24/20 185 lb (83.9 kg)  06/15/20 191 lb (86.6 kg)     He was last seen for hypertension 3 months ago.  Management since that visit includes no changes. He reports excellent compliance with treatment. He is not having side effects.  He is exercising. He is adherent to low salt diet.   Outside blood pressures are not being checked.  He does not smoke.  Use of agents associated with hypertension: none.   --------------------------------------------------------------------------------------------------- Lipid/Cholesterol, follow-up  Last Lipid Panel: Lab Results  Component Value  Date   CHOL 142 07/30/2019   [place] 85 07/30/2019   HDL 39 (L) 07/30/2019   TRIG 97 07/30/2019    He was last seen for this 3 months ago.  Management since that visit includes no changes.  He reports excellent compliance with treatment. He is not having side effects.    Symptoms: No appetite changes No foot ulcerations  No chest pain No chest pressure/discomfort  No dyspnea No orthopnea  No fatigue No lower extremity edema  No palpitations No paroxysmal nocturnal dyspnea  No nausea No numbness or tingling of extremity  No polydipsia No polyuria  No speech difficulty No syncope    Last metabolic panel Lab Results  Component Value Date   GLUCOSE 183 (H) 06/08/2020   NA 138 06/08/2020   K 4.3 06/08/2020   BUN 16 06/08/2020   CREATININE 1.13 06/08/2020   GFRNONAA 59 (L) 06/08/2020   GFRAA >60 06/08/2020   CALCIUM 9.0 06/08/2020   AST 28 06/08/2020   ALT 17 06/08/2020   The ASCVD Risk score (Goff DC Jr., et al., 2013) failed to calculate for the following reasons:   The 2013 ASCVD risk score is only valid for ages 83 to 19   The patient has a prior MI or stroke diagnosis  ---------------------------------------------------------------------------------------------------   Patient Active Problem List   Diagnosis Date Noted  . Abnormal CT scan   . Abnormal findings on radiological examination of gastrointestinal tract   . Polyp of colon   . Cancer of right kidney ([place]) 12/10/2018  . Pulmonary nodules 12/01/2018  . Mediastinal lymphadenopathy 12/01/2018  . Heart block AV third  degree ([place]) 10/07/2018  . Pacemaker 10/07/2018  . H/O aortic valve replacement 10/07/2018  . Blood in stool   . Bleeding duodenal ulcer   . GIB (gastrointestinal bleeding) 06/30/2018  . Acute upper GI bleed   . Symptomatic anemia   . Heart murmur, systolic 123456  . H/O: rheumatic fever 03/21/2017  . Tinnitus 10/30/2016  . On warfarin therapy 07/06/2015  . Arteriosclerosis of  coronary artery 06/13/2015  . Carcinoma in situ of bladder 06/13/2015  . Diabetes mellitus, type 2 ([place]) 06/13/2015  . Diabetic neuropathy ([place]) 06/13/2015  . Dizziness 06/13/2015  . Essential (primary) hypertension 06/13/2015  . Fatigue 06/13/2015  . Glaucoma 06/13/2015  . Hypercholesteremia 06/13/2015  . Male hypogonadism 06/13/2015  . Adult hypothyroidism 06/13/2015  . Arthritis, degenerative 06/13/2015  . CAP (community acquired pneumonia) 06/13/2015  . Adenocarcinoma, renal cell ([place]) 06/13/2015  . Benign head tremor 12/13/2014  . Carotid arterial disease ([place]) 07/12/2014  . Decreased cardiac output 07/12/2014  . Cardiomyopathy, hypertrophic obstructive ([place]) 07/12/2014  . Head injuries 07/12/2014  . HOCM (hypertrophic obstructive cardiomyopathy) ([place])   . Encounter for therapeutic drug monitoring 11/16/2013  . Obstructive sleep apnea   . Benign prostatic hypertrophy without urinary obstruction 06/09/2012  . History of neoplasm of bladder 06/09/2012  . Head injury   . Atherosclerosis of aorta ([place])   . Hypotension   . Stroke ([place])   . Unsteady gait   . Excessive sweating   . Nausea   . ORTHOSTATIC HYPOTENSION, HX OF 09/01/2010  . Overweight(278.02) 03/07/2009  . MITRAL REGURGITATION 03/05/2009  . Chronic atrial fibrillation ([place]) 03/05/2009  . Atrial fibrillation and flutter ([place]) 03/05/2009  . Mitral and aortic incompetence 03/05/2009   Past Medical History:  Diagnosis Date  . Aortic stenosis    a. 01/2016 echo: mod AS; b. 04/2018 Cardiac MRI: sev AS; c. 05/2018 s/p mech AVR @ time of myomectomy; d. 07/2018 Echo: Mild AS/AI.  [name] Atherosclerosis of aorta ([place])    by CT scan in past  . Atrial fibrillation ([place])    and aflutter. pt has a left atrial circuit that is not ablated. was on amiodarone-stopped, now use rate control.   . Bladder cancer ([place])    hx; treated with BCG in past   . Carotid artery disease ([place])    There was calcified plaque but no stenosis by  carotid artery screening done [facility] October, 2013  . CHB (complete heart block) ([place])    a. 05/2018 s/p BSX VVIR PPM ([facility]) in setting of bradycardia following myomectomy and AVR/MVR.  . Diabetes mellitus    type not specified  . Diabetic neuropathy (HCC)    feet  . Elevated liver enzymes    over time; hx  . Excessive sweating   . Glaucoma   . Gout   . Head injury    when slipped on ice 2004-2005. stabilized and back on Coumadin   . Headache    migraines - distant past  . History of cardiac cath    a. 05/2018 Cath ([facility]): Non-obstructive CAD. Mildly elevated filling pressures w/ nl CI.  [name] HOCM (hypertrophic obstructive cardiomyopathy) ([place])    a. 01/2016 Echo: EF 65-70%, no rwma, LVOT gradient of 80-77mmHg, mod AS, SAM; b. 11/2017 Echo: EF 55-60%, near cavity obliteration in systole, mod AS, mild MS; c. 04/2018 Card MRI ([facility]): Sev LVH, EF >70%, Sev AS, LVOT obs w/ Sev MR, mild to mod TR/PR, mid-myocardial HE in basal-mid anteroseptum and inferoseptum; d. 05/2018  s/p Septal myomectomy; e. 07/2018 Echo: EF 60-65%. PASP 4mmHg.  [name] Homocystinemia ([place])    elevated, mild   . Hypercholesterolemia    treated.   . Hypertension   . Infection of right inner ear   . Kidney mass    a. s/p laproscopic surgery woth cryoablation of a mass outside kidney followed [facility]; b. 11/2018 CT Chest: 2cm R kidney mass.  . Mediastinal adenopathy    a. 12/208 CT Chest: up to 6mm LLL lung nodule. 1.5-1.7cm subcarinal/mediastinal adenopahty/right paratracheal lymph node; b. 11/2018 CT Chest: 2.5-3cm mediastinal adenopathy.  . Mitral regurgitation    a. 04/2018 Cardiac MRI: sev MR; b. 05/2018 s/p mech MVR @ time of myomectomy; c. 07/2018 Echo: Mild MS.  . Motion sickness    ocean boats  . Nausea   . Nonobstructive coronary artery disease    a. 05/2018 Cath ([facility]): nonobs CAD.  [name] Obstructive sleep apnea    CPAP started successfully 2014  . Orthostatic hypotension    a. orthostatic.  dehydration. hospitalized 11/11  . Sleep apnea    Significant obstructive sleep apnea diagnosed in August, 2012, the patient is to receive CPAP   . Stroke [facility])    "2 old strokes" CT and MRI. [facility] 11/11. diagnosis was dehydration, no acutal reports.   . TSH elevation    on synthroid historically   . Unsteady gait    August, 2012   Social History   Tobacco Use  . Smoking status: Former Smoker    Types: Cigars, Cigarettes    Quit date: 10/02/1974    Years since quitting: 46.0  . Smokeless tobacco: Never Used  . Tobacco comment: still smokes cigars once a day  Vaping Use  . Vaping Use: Never used  Substance Use Topics  . Alcohol use: No  . Drug use: No   Allergies  Allergen Reactions  . Macrolides And Ketolides Other (See Comments)  . Mycinettes   . Nitrofuran Derivatives Other (See Comments)  . Nitrofurantoin Other (See Comments)  . Erythromycin Itching and Rash    Other reaction(s): UNKNOWN  And red skin  . Zofran [Ondansetron Hcl-Dextrose] Rash  . Zofran [Ondansetron Hcl] Rash     Medications: Outpatient Medications Prior to Visit  Medication Sig  . ALPRAZolam (XANAX) 0.25 MG tablet Take 1 tablet (0.25 mg total) by mouth every 6 (six) hours as needed for anxiety.  [name] aspirin EC 81 MG tablet Take 1 tablet (81 mg total) by mouth daily.  Arna Medici 50 MCG tablet Take 1 tablet by mouth once daily  . glucose blood (ONE TOUCH ULTRA TEST) test strip USE ONE STRIP TO CHECK GLUCOSE ONCE DAILY  . Lancets (ONETOUCH DELICA PLUS 123XX123) MISC USE 1  TO CHECK GLUCOSE ONCE DAILY  . latanoprost (XALATAN) 0.005 % ophthalmic solution Place 1 drop into both eyes at bedtime.   . meclizine (ANTIVERT) 12.5 MG tablet Take 1 tablet (12.5 mg total) by mouth 3 (three) times daily as needed for dizziness. (Patient taking differently: Take 12.5 mg by mouth 3 (three) times daily as needed for dizziness. takes 1/2 tablet 1-2 times daily)  . metFORMIN (GLUCOPHAGE) 1000 MG tablet Take 1  tablet by mouth once daily with breakfast  . metoprolol tartrate (LOPRESSOR) 25 MG tablet Take 1/2 (one-half) tablet by mouth twice daily  . Multiple Vitamins-Minerals (PRESERVISION AREDS) TABS Take 1 tablet by mouth daily.  Glory Rosebush DELICA LANCETS 99991111 MISC USE ONE LANCET TO CHECK BLOOD GLUCOSE ONCE DAILY  .  ONETOUCH ULTRA test strip USE 1 STRIP TO CHECK GLUCOSE ONCE DAILY  . pantoprazole (PROTONIX) 40 MG tablet Take 1 tablet by mouth twice daily (Patient taking differently: Take 40 mg by mouth daily. Takes one tablet daily)  . simvastatin (ZOCOR) 40 MG tablet TAKE 1/2 (ONE-HALF) TABLET BY MOUTH AT BEDTIME  . warfarin (COUMADIN) 3 MG tablet Take as directed by the anti-coag clinic   No facility-administered medications prior to visit.    Review of Systems  Constitutional: Negative.   Respiratory: Negative.   Cardiovascular: Negative.   Gastrointestinal: Negative.   Endocrine: Negative.   Skin: Negative for wound.  Neurological: Negative for dizziness, light-headedness and headaches.      Objective    BP (!) 129/54 (BP Location: Left Arm, Patient Position: Sitting, Cuff Size: Large)   Pulse 71   Temp 98.3 F (36.8 C) (Oral)   Wt 189 lb (85.7 kg)   SpO2 98%   BMI 27.91 kg/m    Physical Exam Vitals reviewed.  Constitutional:      Appearance: He is well-developed.  HENT:     Head: Normocephalic and atraumatic.     Right Ear: External ear normal.     Left Ear: External ear normal.     Nose: Nose normal.  Eyes:     General: No scleral icterus.    Conjunctiva/sclera: Conjunctivae normal.  Neck:     Thyroid: No thyromegaly.     Vascular: No JVD.  Cardiovascular:     Rate and Rhythm: Normal rate and regular rhythm.     Heart sounds: Normal heart sounds.  Pulmonary:     Effort: Pulmonary effort is normal.     Breath sounds: Normal breath sounds.  Abdominal:     Palpations: Abdomen is soft.  Musculoskeletal:        General: No swelling.     Right lower leg: No  edema.     Left lower leg: No edema.  Skin:    General: Skin is warm and dry.  Neurological:     Mental Status: He is alert and oriented to person, place, and time. Mental status is at baseline.  Psychiatric:        Mood and Affect: Mood normal.        Behavior: Behavior normal.        Thought Content: Thought content normal.        Judgment: Judgment normal.       No results found for any visits on 10/24/20.  Assessment & Plan     1. Essential (primary) hypertension Good control. - Lipid panel - Comprehensive metabolic panel  2. Type 2 diabetes mellitus with diabetic polyneuropathy, without long-term current use of insulin (HCC) Controlled on metformin. - Hemoglobin A1c - Lipid panel - Comprehensive metabolic panel  3. Hypercholesteremia On simvastatin - Lipid panel - Comprehensive metabolic panel  4. MCI (mild cognitive impairment) MMSE today is 29/30.  Offered neurology referral but patient and wife declined.  5. Atherosclerosis of aorta ([place]) All risk factors treated.  6. Arteriosclerosis of coronary artery  7. Atrial fibrillation and flutter (HCC) Anticoagulation long-term  8. Cardiomyopathy, hypertrophic obstructive ([place]) By cardiology.  He has had myomectomy.  9. Heart block AV third degree ([place]) Pacemaker in place  10. Mediastinal lymphadenopathy By oncology  11. Adult hypothyroidism   12. Male hypogonadism   29. Carcinoma in situ of bladder By urology  14. Renal cell carcinoma, unspecified laterality ([place])   15. Pacemaker   16. Unsteady gait  Improved with strengthening over the past year.   No follow-ups on file.      I, [name], MD, have reviewed all documentation for this visit. The documentation on 10/29/20 for the exam, diagnosis, procedures, and orders are all accurate and complete.    Richard Cranford Mon, MD  [facility] [phone] (phone) [phone] (fax)  [place]

## 2020-10-25 DIAGNOSIS — I10 Essential (primary) hypertension: Secondary | ICD-10-CM | POA: Diagnosis not present

## 2020-10-25 DIAGNOSIS — E7800 Pure hypercholesterolemia, unspecified: Secondary | ICD-10-CM | POA: Diagnosis not present

## 2020-10-25 DIAGNOSIS — E1142 Type 2 diabetes mellitus with diabetic polyneuropathy: Secondary | ICD-10-CM | POA: Diagnosis not present

## 2020-10-26 ENCOUNTER — Other Ambulatory Visit: Payer: Self-pay

## 2020-10-26 ENCOUNTER — Ambulatory Visit: Payer: PPO

## 2020-10-26 DIAGNOSIS — I482 Chronic atrial fibrillation, unspecified: Secondary | ICD-10-CM | POA: Diagnosis not present

## 2020-10-26 DIAGNOSIS — Z5181 Encounter for therapeutic drug level monitoring: Secondary | ICD-10-CM

## 2020-10-26 DIAGNOSIS — Z952 Presence of prosthetic heart valve: Secondary | ICD-10-CM

## 2020-10-26 DIAGNOSIS — I4891 Unspecified atrial fibrillation: Secondary | ICD-10-CM

## 2020-10-26 DIAGNOSIS — I4892 Unspecified atrial flutter: Secondary | ICD-10-CM

## 2020-10-26 DIAGNOSIS — Z950 Presence of cardiac pacemaker: Secondary | ICD-10-CM

## 2020-10-26 LAB — HEMOGLOBIN A1C
Est. average glucose Bld gHb Est-mCnc: 126 mg/dL
Hgb A1c MFr Bld: 6 % — ABNORMAL HIGH (ref 4.8–5.6)

## 2020-10-26 LAB — COMPREHENSIVE METABOLIC PANEL
ALT: 15 IU/L (ref 0–44)
AST: 26 IU/L (ref 0–40)
Albumin/Globulin Ratio: 1.4 (ref 1.2–2.2)
Albumin: 4.5 g/dL (ref 3.6–4.6)
Alkaline Phosphatase: 81 IU/L (ref 44–121)
BUN/Creatinine Ratio: 16 (ref 10–24)
BUN: 16 mg/dL (ref 8–27)
Bilirubin Total: 0.9 mg/dL (ref 0.0–1.2)
CO2: 25 mmol/L (ref 20–29)
Calcium: 9.6 mg/dL (ref 8.6–10.2)
Chloride: 100 mmol/L (ref 96–106)
Creatinine, Ser: 0.99 mg/dL (ref 0.76–1.27)
GFR calc Af Amer: 81 mL/min/{1.73_m2} (ref >59)
GFR calc non Af Amer: 70 mL/min/{1.73_m2} (ref >59)
Globulin, Total: 3.2 g/dL (ref 1.5–4.5)
Glucose: 109 mg/dL — ABNORMAL HIGH (ref 65–99)
Potassium: 4.7 mmol/L (ref 3.5–5.2)
Sodium: 138 mmol/L (ref 134–144)
Total Protein: 7.7 g/dL (ref 6.0–8.5)

## 2020-10-26 LAB — LIPID PANEL
Chol/HDL Ratio: 4.3 ratio (ref 0.0–5.0)
Cholesterol, Total: 178 mg/dL (ref 100–199)
HDL: 41 mg/dL (ref >39)
LDL Chol Calc (NIH): 110 mg/dL — ABNORMAL HIGH (ref 0–99)
Triglycerides: 153 mg/dL — ABNORMAL HIGH (ref 0–149)
VLDL Cholesterol Cal: 27 mg/dL (ref 5–40)

## 2020-10-26 LAB — POCT INR: INR: 2.3 (ref 2.0–3.0)

## 2020-10-26 MED ORDER — WARFARIN SODIUM 3 MG PO TABS
ORAL_TABLET | ORAL | 4 refills | Status: DC
Start: 2020-10-26 — End: 2020-12-07

## 2020-10-26 NOTE — Patient Instructions (Signed)
-   take 2 whole tablets tonight, then  - continue Warfarin 1.5 tablets every day EXCEPT 1 tablet on [place]. - Recheck INR in 6 weeks.

## 2020-11-01 ENCOUNTER — Telehealth: Payer: Self-pay

## 2020-11-01 MED ORDER — ATORVASTATIN CALCIUM 40 MG PO TABS: 40.0000 mg | ORAL_TABLET | Freq: Every day | ORAL | 3 refills | Status: DC

## 2020-11-01 NOTE — Telephone Encounter (Signed)
-----   Message from [name], MD sent at 10/27/2020  8:18 AM EST ----- Labs okay but cholesterol little bit high.  Would recommend changing simvastatin 40 to rosuvastatin 40 mg daily.

## 2020-11-02 ENCOUNTER — Telehealth: Payer: Self-pay | Admitting: Family Medicine

## 2020-11-02 DIAGNOSIS — E1142 Type 2 diabetes mellitus with diabetic polyneuropathy: Secondary | ICD-10-CM

## 2020-11-02 MED ORDER — ONETOUCH ULTRA VI STRP: ORAL_STRIP | 0 refills | Status: DC

## 2020-11-02 NOTE — Addendum Note (Signed)
Addended by: Julieta Bellini on: 11/02/2020 04:53 PM   Modules accepted: Orders

## 2020-11-02 NOTE — Telephone Encounter (Addendum)
Please review for refill. Unable to reorder under encounter. Message received that new order needs to be placed for test strips.

## 2020-11-02 NOTE — Telephone Encounter (Signed)
Medication Refill - Medication: ONETOUCH ULTRA test strip  Pt is out of town and needs refill sent to pharmacy below / please advise   Has the patient contacted their pharmacy? Yes.   (Agent: If no, request that the patient contact the pharmacy for the refill.) (Agent: If yes, when and what did the pharmacy advise?)they need a new Rx   Preferred Pharmacy (with phone number or street name): [place], [place] - [address]  [address] [place] [address]  Phone:  [phone] Fax:  [phone]   Agent: Please be advised that RX refills may take up to 3 business days. We ask that you follow-up with your pharmacy.

## 2020-11-04 DIAGNOSIS — G4733 Obstructive sleep apnea (adult) (pediatric): Secondary | ICD-10-CM | POA: Diagnosis not present

## 2020-11-08 DIAGNOSIS — Z950 Presence of cardiac pacemaker: Secondary | ICD-10-CM | POA: Diagnosis not present

## 2020-11-15 ENCOUNTER — Telehealth: Payer: Self-pay | Admitting: Internal Medicine

## 2020-11-15 NOTE — Telephone Encounter (Signed)
Patients recall for Dr. Caryl Comes from 2019 has been removed as patient is going to a [facility] provider

## 2020-11-15 NOTE — Telephone Encounter (Signed)
Noted. However, it looks like he should be due to see Dr. Fletcher Anon this month. There is no recall in.   Please arrange for a 6 month follow up with Dr. Fletcher Anon APP per checkout 06/09/20.

## 2020-11-29 DIAGNOSIS — Z950 Presence of cardiac pacemaker: Secondary | ICD-10-CM | POA: Diagnosis not present

## 2020-12-01 ENCOUNTER — Other Ambulatory Visit: Payer: Self-pay | Admitting: Family Medicine

## 2020-12-01 DIAGNOSIS — R42 Dizziness and giddiness: Secondary | ICD-10-CM

## 2020-12-05 ENCOUNTER — Other Ambulatory Visit: Payer: Self-pay

## 2020-12-05 ENCOUNTER — Ambulatory Visit
Admission: RE | Admit: 2020-12-05 | Discharge: 2020-12-05 | Disposition: A | Discharge status: Home / Self Care | Payer: PPO | Source: Ambulatory Visit | Attending: Internal Medicine | Admitting: Internal Medicine

## 2020-12-05 DIAGNOSIS — I700 Atherosclerosis of aorta: Secondary | ICD-10-CM | POA: Diagnosis not present

## 2020-12-05 DIAGNOSIS — R918 Other nonspecific abnormal finding of lung field: Secondary | ICD-10-CM | POA: Diagnosis not present

## 2020-12-05 DIAGNOSIS — R590 Localized enlarged lymph nodes: Secondary | ICD-10-CM | POA: Diagnosis not present

## 2020-12-05 DIAGNOSIS — I2510 Atherosclerotic heart disease of native coronary artery without angina pectoris: Secondary | ICD-10-CM | POA: Diagnosis not present

## 2020-12-05 LAB — POCT I-STAT CREATININE: Creatinine, Ser: 0.9 mg/dL (ref 0.61–1.24)

## 2020-12-05 MED ORDER — IOHEXOL 300 MG/ML  SOLN
75.0000 mL | Freq: Once | INTRAMUSCULAR | Status: AC | PRN
  Administered 2020-12-05: 75 mL via INTRAVENOUS

## 2020-12-07 ENCOUNTER — Other Ambulatory Visit: Payer: Self-pay

## 2020-12-07 ENCOUNTER — Ambulatory Visit (INDEPENDENT_AMBULATORY_CARE_PROVIDER_SITE_OTHER): Payer: PPO

## 2020-12-07 ENCOUNTER — Inpatient Hospital Stay: Payer: PPO | Admitting: Internal Medicine

## 2020-12-07 ENCOUNTER — Inpatient Hospital Stay: Payer: PPO | Attending: Internal Medicine

## 2020-12-07 ENCOUNTER — Encounter: Payer: Self-pay | Admitting: Internal Medicine

## 2020-12-07 DIAGNOSIS — R590 Localized enlarged lymph nodes: Secondary | ICD-10-CM

## 2020-12-07 DIAGNOSIS — Z7982 Long term (current) use of aspirin: Secondary | ICD-10-CM | POA: Insufficient documentation

## 2020-12-07 DIAGNOSIS — I10 Essential (primary) hypertension: Secondary | ICD-10-CM | POA: Diagnosis not present

## 2020-12-07 DIAGNOSIS — G4733 Obstructive sleep apnea (adult) (pediatric): Secondary | ICD-10-CM | POA: Diagnosis not present

## 2020-12-07 DIAGNOSIS — Z7901 Long term (current) use of anticoagulants: Secondary | ICD-10-CM | POA: Diagnosis not present

## 2020-12-07 DIAGNOSIS — R634 Abnormal weight loss: Secondary | ICD-10-CM | POA: Insufficient documentation

## 2020-12-07 DIAGNOSIS — I4892 Unspecified atrial flutter: Secondary | ICD-10-CM | POA: Diagnosis not present

## 2020-12-07 DIAGNOSIS — E1140 Type 2 diabetes mellitus with diabetic neuropathy, unspecified: Secondary | ICD-10-CM | POA: Insufficient documentation

## 2020-12-07 DIAGNOSIS — Z87891 Personal history of nicotine dependence: Secondary | ICD-10-CM | POA: Diagnosis not present

## 2020-12-07 DIAGNOSIS — I4891 Unspecified atrial fibrillation: Secondary | ICD-10-CM | POA: Diagnosis not present

## 2020-12-07 DIAGNOSIS — R413 Other amnesia: Secondary | ICD-10-CM | POA: Diagnosis not present

## 2020-12-07 DIAGNOSIS — Z952 Presence of prosthetic heart valve: Secondary | ICD-10-CM | POA: Insufficient documentation

## 2020-12-07 DIAGNOSIS — I2510 Atherosclerotic heart disease of native coronary artery without angina pectoris: Secondary | ICD-10-CM | POA: Diagnosis not present

## 2020-12-07 DIAGNOSIS — Z5181 Encounter for therapeutic drug level monitoring: Secondary | ICD-10-CM | POA: Diagnosis not present

## 2020-12-07 DIAGNOSIS — R5383 Other fatigue: Secondary | ICD-10-CM | POA: Insufficient documentation

## 2020-12-07 DIAGNOSIS — Z950 Presence of cardiac pacemaker: Secondary | ICD-10-CM | POA: Diagnosis not present

## 2020-12-07 DIAGNOSIS — I482 Chronic atrial fibrillation, unspecified: Secondary | ICD-10-CM

## 2020-12-07 DIAGNOSIS — Z79899 Other long term (current) drug therapy: Secondary | ICD-10-CM | POA: Diagnosis not present

## 2020-12-07 DIAGNOSIS — E7800 Pure hypercholesterolemia, unspecified: Secondary | ICD-10-CM | POA: Insufficient documentation

## 2020-12-07 DIAGNOSIS — Z7984 Long term (current) use of oral hypoglycemic drugs: Secondary | ICD-10-CM | POA: Insufficient documentation

## 2020-12-07 DIAGNOSIS — I421 Obstructive hypertrophic cardiomyopathy: Secondary | ICD-10-CM | POA: Diagnosis not present

## 2020-12-07 DIAGNOSIS — Z951 Presence of aortocoronary bypass graft: Secondary | ICD-10-CM | POA: Insufficient documentation

## 2020-12-07 DIAGNOSIS — Z85528 Personal history of other malignant neoplasm of kidney: Secondary | ICD-10-CM | POA: Insufficient documentation

## 2020-12-07 DIAGNOSIS — E119 Type 2 diabetes mellitus without complications: Secondary | ICD-10-CM | POA: Diagnosis not present

## 2020-12-07 DIAGNOSIS — Z8673 Personal history of transient ischemic attack (TIA), and cerebral infarction without residual deficits: Secondary | ICD-10-CM | POA: Insufficient documentation

## 2020-12-07 DIAGNOSIS — I350 Nonrheumatic aortic (valve) stenosis: Secondary | ICD-10-CM | POA: Diagnosis not present

## 2020-12-07 DIAGNOSIS — I700 Atherosclerosis of aorta: Secondary | ICD-10-CM | POA: Insufficient documentation

## 2020-12-07 LAB — COMPREHENSIVE METABOLIC PANEL
ALT: 17 U/L (ref 0–44)
AST: 26 U/L (ref 15–41)
Albumin: 4.2 g/dL (ref 3.5–5.0)
Alkaline Phosphatase: 65 U/L (ref 38–126)
Anion gap: 10 (ref 5–15)
BUN: 21 mg/dL (ref 8–23)
CO2: 26 mmol/L (ref 22–32)
Calcium: 9 mg/dL (ref 8.9–10.3)
Chloride: 103 mmol/L (ref 98–111)
Creatinine, Ser: 0.92 mg/dL (ref 0.61–1.24)
GFR, Estimated: >60 mL/min (ref >60)
Glucose, Bld: 138 mg/dL — ABNORMAL HIGH (ref 70–99)
Potassium: 4.3 mmol/L (ref 3.5–5.1)
Sodium: 139 mmol/L (ref 135–145)
Total Bilirubin: 0.9 mg/dL (ref 0.3–1.2)
Total Protein: 7.8 g/dL (ref 6.5–8.1)

## 2020-12-07 LAB — CBC WITH DIFFERENTIAL/PLATELET
Abs Immature Granulocytes: 0.02 10*3/uL (ref 0.00–0.07)
Basophils Absolute: 0.1 10*3/uL (ref 0.0–0.1)
Basophils Relative: 1 %
Eosinophils Absolute: 0.4 10*3/uL (ref 0.0–0.5)
Eosinophils Relative: 4 %
HCT: 38.1 % — ABNORMAL LOW (ref 39.0–52.0)
Hemoglobin: 12.6 g/dL — ABNORMAL LOW (ref 13.0–17.0)
Immature Granulocytes: 0 %
Lymphocytes Relative: 26 %
Lymphs Abs: 2.1 10*3/uL (ref 0.7–4.0)
MCH: 30.9 pg (ref 26.0–34.0)
MCHC: 33.1 g/dL (ref 30.0–36.0)
MCV: 93.4 fL (ref 80.0–100.0)
Monocytes Absolute: 0.7 10*3/uL (ref 0.1–1.0)
Monocytes Relative: 9 %
Neutro Abs: 4.9 10*3/uL (ref 1.7–7.7)
Neutrophils Relative %: 60 %
Platelets: 165 10*3/uL (ref 150–400)
RBC: 4.08 MIL/uL — ABNORMAL LOW (ref 4.22–5.81)
RDW: 13.2 % (ref 11.5–15.5)
WBC: 8.2 10*3/uL (ref 4.0–10.5)
nRBC: 0 % (ref 0.0–0.2)

## 2020-12-07 LAB — POCT INR: INR: 2.6 (ref 2.0–3.0)

## 2020-12-07 LAB — LACTATE DEHYDROGENASE: LDH: 244 U/L — ABNORMAL HIGH (ref 98–192)

## 2020-12-07 MED ORDER — WARFARIN SODIUM 3 MG PO TABS: ORAL_TABLET | ORAL | 4 refills | Status: DC

## 2020-12-07 NOTE — Assessment & Plan Note (Addendum)
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 [Since December 2018]. FEB 2022--CT scan chest ~1.5 cm; STABLE.  Again reviewed the differential diagnosis of benign etiology/low-grade lymphoma-however given patient's medical comorbidities age I think it is reasonable to continue surveillance without any aggressive work-up at this time.  Given the overall stability disease; reasonable to follow-up imaging in 1 year.  Will order imaging at next visit.   # RCC s/p Right ablation-2001.  August 2021-CT scan partial evaluation; hold off imaging at this time/ Dr.Stoioff-   # DISPOSITION: # in  6 months- MD/labs-cbc/cmp/ldh- Dr.B  # I reviewed the blood work- with the patient in detail; also reviewed the imaging independently [as summarized above]; and with the patient in detail.    Cc; Dr.Gilbert .

## 2020-12-07 NOTE — Progress Notes (Signed)
[facility] NOTE  Patient Care Team: [name], MD as PCP - General (Unknown Physician Specialty) Wellington Hampshire, MD as PCP - Cardiology (Cardiology) Margaretha Sheffield, MD (Otolaryngology) Leandrew Koyanagi, MD as Referring Physician (Ophthalmology) Sharlotte Alamo, DPM as Consulting Physician (Podiatry) Bernardo Heater, Ronda Fairly, MD as Consulting Physician (Urology) Jannet Mantis, MD as Consulting Physician (Dermatology) Telford Nab, RN as Registered Nurse [name], MD as Consulting Physician (Gastroenterology) Cockfield, Deanne Coffer, PA-C (Cardiology)  CHIEF COMPLAINTS/PURPOSE OF CONSULTATION:  Mediastinal adenopathy  #  Oncology History Overview Note  #February 2020 gastroenteropathy/right supraclavicular adenopathy-incidental]-PET scan 2020-low-grade FDG activity-differential low-grade lymphoma versus benign reactive process. no biopsy  #Right kidney-cancer status post cryo [[facility]]  # 2020- colonoscopy s/p polypectomy  [Dr.Wohl-]  #CAD status post CABG; mechanical valve replacement [2019 August; [facility]]; on Coumadin     Adenocarcinoma, renal cell ([place])  06/13/2015 Initial Diagnosis   Adenocarcinoma, renal cell ([place])   Cancer of right kidney ([place])  12/10/2018 Initial Diagnosis   Cancer of right kidney ([place])     HISTORY OF PRESENTING ILLNESS:  [name] [name] 87 y.o.  male is here for follow-up to review the results of his CT scan/follow-up of mediastinal/neck adenopathy  As per the family patient is doing fairly well.  Denies any new shortness of breath or cough or fevers or chills.  No weight loss.  Continues to have memory loss.   Review of Systems  Constitutional: Positive for malaise/fatigue and weight loss. Negative for chills, diaphoresis and fever.  HENT: Negative for nosebleeds and sore throat.   Eyes: Negative for double vision.  Respiratory: Negative for cough, hemoptysis, sputum production, shortness of breath and  wheezing.   Cardiovascular: Negative for chest pain, palpitations, orthopnea and leg swelling.  Gastrointestinal: Negative for abdominal pain, blood in stool, constipation, diarrhea, heartburn, melena, nausea and vomiting.  Genitourinary: Negative for dysuria, frequency and urgency.  Musculoskeletal: Negative for back pain and joint pain.  Skin: Negative.  Negative for itching and rash.  Neurological: Positive for dizziness. Negative for tingling, focal weakness, weakness and headaches.  Endo/Heme/Allergies: Does not bruise/bleed easily.  Psychiatric/Behavioral: Negative for depression. The patient is not nervous/anxious and does not have insomnia.      MEDICAL HISTORY:  Past Medical History:  Diagnosis Date  . Aortic stenosis    a. 01/2016 echo: mod AS; b. 04/2018 Cardiac MRI: sev AS; c. 05/2018 s/p mech AVR @ time of myomectomy; d. 07/2018 Echo: Mild AS/AI.  [name] Atherosclerosis of aorta ([place])    by CT scan in past  . Atrial fibrillation ([place])    and aflutter. pt has a left atrial circuit that is not ablated. was on amiodarone-stopped, now use rate control.   . Bladder cancer ([place])    hx; treated with BCG in past   . Carotid artery disease ([place])    There was calcified plaque but no stenosis by carotid artery screening done at [facility] October, 2013  . CHB (complete heart block) ([place])    a. 05/2018 s/p BSX VVIR PPM ([facility]) in setting of bradycardia following myomectomy and AVR/MVR.  . Diabetes mellitus    type not specified  . Diabetic neuropathy (H[facility])    feet  . Elevated liver enzymes    over time; hx  . Excessive sweating   . Glaucoma   . Gout   . Head injury    when slipped on ice 2004-2005. stabilized and back on Coumadin   . Headache  migraines - distant past  . History of cardiac cath    a. 05/2018 Cath ([facility]): Non-obstructive CAD. Mildly elevated filling pressures w/ nl CI.  [name] HOCM (hypertrophic obstructive cardiomyopathy) ([place])    a. 01/2016 Echo: EF  65-70%, no rwma, LVOT gradient of 80-19mmHg, mod AS, SAM; b. 11/2017 Echo: EF 55-60%, near cavity obliteration in systole, mod AS, mild MS; c. 04/2018 Card MRI ([facility]): Sev LVH, EF >70%, Sev AS, LVOT obs w/ Sev MR, mild to mod TR/PR, mid-myocardial HE in basal-mid anteroseptum and inferoseptum; d. 05/2018 s/p Septal myomectomy; e. 07/2018 Echo: EF 60-65%. PASP 60mmHg.  [name] Homocystinemia ([place])    elevated, mild   . Hypercholesterolemia    treated.   . Hypertension   . Infection of right inner ear   . Kidney mass    a. s/p laproscopic surgery woth cryoablation of a mass outside kidney [facility]; b. 11/2018 CT Chest: 2cm R kidney mass.  . Mediastinal adenopathy    a. 12/208 CT Chest: up to 42mm LLL lung nodule. 1.5-1.7cm subcarinal/mediastinal adenopahty/right paratracheal lymph node; b. 11/2018 CT Chest: 2.5-3cm mediastinal adenopathy.  . Mitral regurgitation    a. 04/2018 Cardiac MRI: sev MR; b. 05/2018 s/p mech MVR @ time of myomectomy; c. 07/2018 Echo: Mild MS.  . Motion sickness    ocean boats  . Nausea   . Nonobstructive coronary artery disease    a. 05/2018 Cath ([facility]): nonobs CAD.  [name] Obstructive sleep apnea    CPAP started successfully 2014  . Orthostatic hypotension    a. orthostatic. dehydration. hospitalized 11/11  . Sleep apnea    Significant obstructive sleep apnea diagnosed in August, 2012, the patient is to receive CPAP   . Stroke [facility]    "2 old strokes" CT and MRI. [place] [facility] 11/11. diagnosis was dehydration, no acutal reports.   . TSH elevation    on synthroid historically   . Unsteady gait    August, 2012    SURGICAL HISTORY: Past Surgical History:  Procedure Laterality Date  . BLADDER SURGERY    . CATARACT EXTRACTION W/PHACO Left 05/11/2015   Procedure: CATARACT EXTRACTION PHACO AND INTRAOCULAR LENS PLACEMENT (IOC);  Surgeon: Leandrew Koyanagi, MD;  Location: [facility];  Service: Ophthalmology;  Laterality: Left;  DIABETIC - oral meds, CPAP  .  COLONOSCOPY WITH PROPOFOL N/A 04/14/2019   Procedure: COLONOSCOPY WITH PROPOFOL;  Surgeon: [name], MD;  Location: [facility] ENDOSCOPY;  Service: Endoscopy;  Laterality: N/A;  . ESOPHAGOGASTRODUODENOSCOPY (EGD) WITH PROPOFOL N/A 07/01/2018   Procedure: ESOPHAGOGASTRODUODENOSCOPY (EGD) WITH PROPOFOL;  Surgeon: [name], MD;  Location: [facility] ENDOSCOPY;  Service: Endoscopy;  Laterality: N/A;  . INSERT / REPLACE / REMOVE PACEMAKER     Chemical engineer   . KIDNEY SURGERY     "froze mass"  . MECHANICAL AORTIC AND MITRAL VALVE REPLACEMENT  05/2018   [facility]   . TONSILLECTOMY    . VALVE REPLACEMENT      SOCIAL HISTORY: Social History   Socioeconomic History  . Marital status: Married    Spouse name: Jasmine Awe  . Number of children: 1  . Years of education: 17  . Highest education level: Bachelor's degree (e.g., BA, AB, BS)  Occupational History  . Occupation: retired  Tobacco Use  . Smoking status: Former Smoker    Types: Cigars, Cigarettes    Quit date: 10/02/1974    Years since quitting: 46.2  . Smokeless tobacco: Never Used  . Tobacco comment: still smokes cigars once a  day  Vaping Use  . Vaping Use: Never used  Substance and Sexual Activity  . Alcohol use: No  . Drug use: No  . Sexual activity: Yes  Other Topics Concern  . Not on file  Social History Narrative   Married, retired, gets regular exercise.    Social Determinants of Health   Financial Resource Strain: Low Risk   . Difficulty of Paying Living Expenses: Not hard at all  Food Insecurity: No Food Insecurity  . Worried About Charity fundraiser in the Last Year: Never true  . Ran Out of Food in the Last Year: Never true  Transportation Needs: No Transportation Needs  . Lack of Transportation (Medical): No  . Lack of Transportation (Non-Medical): No  Physical Activity: Inactive  . Days of Exercise per Week: 0 days  . Minutes of Exercise per Session: 0 min  Stress: No Stress Concern Present  . Feeling of  Stress : Not at all  Social Connections: Moderately Isolated  . Frequency of Communication with Friends and Family: More than three times a week  . Frequency of Social Gatherings with Friends and Family: More than three times a week  . Attends Religious Services: Never  . Active Member of Clubs or Organizations: No  . Attends Archivist Meetings: Never  . Marital Status: Married  Human resources officer Violence: Not At Risk  . Fear of Current or Ex-Partner: No  . Emotionally Abused: No  . Physically Abused: No  . Sexually Abused: No    FAMILY HISTORY: Family History  Problem Relation Age of Onset  . Arrhythmia Father        A-Fib  . Prostate cancer Father   . Stroke Father   . Breast cancer Mother   . Arrhythmia Brother        A-Fib  . Heart attack Neg Hx   . Hypertension Neg Hx     ALLERGIES:  is allergic to macrolides and ketolides, mycinettes, nitrofuran derivatives, nitrofurantoin, erythromycin, zofran [ondansetron hcl-dextrose], and zofran [ondansetron hcl].  MEDICATIONS:  Current Outpatient Medications  Medication Sig Dispense Refill  . aspirin EC 81 MG tablet Take 1 tablet (81 mg total) by mouth daily.    [name] atorvastatin (LIPITOR) 40 MG tablet Take 1 tablet (40 mg total) by mouth daily. 90 tablet 3  . EUTHYROX 50 MCG tablet Take 1 tablet by mouth once daily 90 tablet 0  . glucose blood (ONE TOUCH ULTRA TEST) test strip USE ONE STRIP TO CHECK GLUCOSE ONCE DAILY 100 each 12  . glucose blood (ONETOUCH ULTRA) test strip USE 1 STRIP TO CHECK GLUCOSE ONCE DAILY 50 each 0  . Lancets (ONETOUCH DELICA PLUS [id number]) MISC USE 1  TO CHECK GLUCOSE ONCE DAILY 100 each 6  . latanoprost (XALATAN) 0.005 % ophthalmic solution Place 1 drop into both eyes at bedtime.     . meclizine (ANTIVERT) 12.5 MG tablet TAKE 1 TABLET BY MOUTH THREE TIMES DAILY AS NEEDED FOR  DIZZINESS 90 tablet 0  . metFORMIN (GLUCOPHAGE) 1000 MG tablet Take 1 tablet by mouth once daily with breakfast 90 tablet  1  . metoprolol tartrate (LOPRESSOR) 25 MG tablet Take 1/2 (one-half) tablet by mouth twice daily 90 tablet 0  . Multiple Vitamins-Minerals (PRESERVISION AREDS) TABS Take 1 tablet by mouth daily.    Glory Rosebush DELICA LANCETS 24P MISC USE ONE LANCET TO CHECK BLOOD GLUCOSE ONCE DAILY 100 each 12  . pantoprazole (PROTONIX) 40 MG tablet Take 1 tablet by mouth  twice daily (Patient taking differently: Take 40 mg by mouth daily. Takes one tablet daily) 180 tablet 2  . warfarin (COUMADIN) 3 MG tablet Take as directed by the anti-coag clinic 60 tablet 4  . ALPRAZolam (XANAX) 0.25 MG tablet Take 1 tablet (0.25 mg total) by mouth every 6 (six) hours as needed for anxiety. (Patient not taking: Reported on 12/07/2020) 60 tablet 0   No current facility-administered medications for this visit.      [name]  PHYSICAL EXAMINATION: ECOG PERFORMANCE STATUS: 0 - Asymptomatic  Vitals:   12/07/20 1439  BP: 139/65  Pulse: 70  Resp: 16  Temp: 98.3 F (36.8 C)  SpO2: 98%   Filed Weights   12/07/20 1439  Weight: 190 lb (86.2 kg)    Physical Exam Constitutional:      Comments: He is walking with a cane.  He is accompanied by his wife/daughter.  HENT:     Head: Normocephalic and atraumatic.     Mouth/Throat:     Pharynx: No oropharyngeal exudate.  Eyes:     Pupils: Pupils are equal, round, and reactive to light.  Cardiovascular:     Rate and Rhythm: Normal rate and regular rhythm.  Pulmonary:     Effort: Pulmonary effort is normal. No respiratory distress.     Breath sounds: Normal breath sounds. No wheezing.  Abdominal:     General: Bowel sounds are normal. There is no distension.     Palpations: Abdomen is soft. There is no mass.     Tenderness: There is no abdominal tenderness. There is no guarding or rebound.  Musculoskeletal:        General: No tenderness. Normal range of motion.     Cervical back: Normal range of motion and neck supple.  Skin:    General: Skin is warm.  Neurological:      Mental Status: He is alert and oriented to person, place, and time.  Psychiatric:        Mood and Affect: Affect normal.      LABORATORY DATA:  I have reviewed the data as listed Lab Results  Component Value Date   WBC 8.2 12/07/2020   HGB 12.6 (L) 12/07/2020   HCT 38.1 (L) 12/07/2020   MCV 93.4 12/07/2020   PLT 165 12/07/2020   Recent Labs    06/08/20 1249 10/25/20 1006 12/05/20 1129 12/07/20 1427  NA 138 138  --  139  K 4.3 4.7  --  4.3  CL 102 100  --  103  CO2 26 25  --  26  GLUCOSE 183* 109*  --  138*  BUN 16 16  --  21  CREATININE 1.13 0.99 0.90 0.92  CALCIUM 9.0 9.6  --  9.0  GFRNONAA 59* 70  --  >60  GFRAA >60 81  --   --   PROT 8.2* 7.7  --  7.8  ALBUMIN 4.3 4.5  --  4.2  AST 28 26  --  26  ALT 17 15  --  17  ALKPHOS 64 81  --  65  BILITOT 0.7 0.9  --  0.9    RADIOGRAPHIC STUDIES: I have personally reviewed the radiological images as listed and agreed with the findings in the report. CT Chest W Contrast  Result Date: 12/06/2020 CLINICAL DATA:  Follow-up mediastinal lymphadenopathy. Personal history bladder carcinoma and renal cell carcinoma. EXAM: CT CHEST WITH CONTRAST TECHNIQUE: Multidetector CT imaging of the chest was performed during intravenous contrast administration. CONTRAST:  57mL OMNIPAQUE IOHEXOL 300 MG/ML  SOLN COMPARISON:  06/07/2020 and PET-CT on 12/09/2018 FINDINGS: Cardiovascular: No acute findings. Aortic and coronary atherosclerotic calcification noted. Prior CABG, mitral valve replacement, and pacemaker again demonstrated. Mediastinum/Nodes: Mild mediastinal lymphadenopathy shows no significant change, with index lymph node in the right paratracheal region measuring 1.5 cm short axis on image 43/2. No new or increased sites of lymphadenopathy identified. Lungs/Pleura: A few scattered tiny less than 5 mm pulmonary nodules remain stable. No evidence of acute infiltrate or pleural effusion. Upper Abdomen:  Hepatic cirrhosis again noted.  Musculoskeletal:  No suspicious bone lesions. IMPRESSION: Stable mild mediastinal lymphadenopathy. No new or progressive disease within the thorax. Hepatic cirrhosis again noted. Aortic Atherosclerosis (ICD10-I70.0). Electronically Signed   By: Marlaine Hind M.D.   On: 12/06/2020 09:24    ASSESSMENT & PLAN:   Mediastinal lymphadenopathy # Mediastinal Right paratracheal  LN/right supraclavicular adenopathy 1-2 centimeters lymph node. [Since December 2018]. FEB 2022--CT scan chest ~1.5 cm; STABLE.  Again reviewed the differential diagnosis of benign etiology/low-grade lymphoma-however given patient's medical comorbidities age I think it is reasonable to continue surveillance without any aggressive work-up at this time.  Given the overall stability disease; reasonable to follow-up imaging in 1 year.  Will order imaging at next visit.   # RCC s/p Right ablation-2001.  August 2021-CT scan partial evaluation; hold off imaging at this time/ Dr.Stoioff-   # DISPOSITION: # in  6 months- MD/labs-cbc/cmp/ldh- Dr.B  # I reviewed the blood work- with the patient in detail; also reviewed the imaging independently [as summarized above]; and with the patient in detail.    Cc; Dr.Gilbert .  All questions were answered. The patient knows to call the clinic with any problems, questions or concerns.    Cammie Sickle, MD 12/12/2020 8:20 PM

## 2020-12-07 NOTE — Patient Instructions (Signed)
-   continue Warfarin 1.5 tablets every day EXCEPT 1 tablet on [place]. - Recheck INR in 6 weeks.

## 2020-12-16 ENCOUNTER — Ambulatory Visit: Payer: PPO | Admitting: Cardiovascular Disease

## 2020-12-28 ENCOUNTER — Other Ambulatory Visit: Payer: Self-pay | Admitting: Cardiovascular Disease

## 2021-01-09 ENCOUNTER — Telehealth: Payer: Self-pay | Admitting: Family Medicine

## 2021-01-09 DIAGNOSIS — E119 Type 2 diabetes mellitus without complications: Secondary | ICD-10-CM | POA: Diagnosis not present

## 2021-01-09 DIAGNOSIS — B351 Tinea unguium: Secondary | ICD-10-CM | POA: Diagnosis not present

## 2021-01-09 NOTE — Telephone Encounter (Signed)
Copied from [place] [id number]. Topic: Medicare AWV >> Jan 09, 2021 12:47 PM Cher Nakai R wrote: Reason for CRM:  01/09/21 LM - Cancel AWVS -the [facility] will not be available  and we will call patient back to reschedule at later date-srs

## 2021-01-10 DIAGNOSIS — H353121 Nonexudative age-related macular degeneration, left eye, early dry stage: Secondary | ICD-10-CM | POA: Diagnosis not present

## 2021-01-10 LAB — HM DIABETES EYE EXAM

## 2021-01-13 ENCOUNTER — Ambulatory Visit: Payer: PPO | Admitting: Cardiovascular Disease

## 2021-01-13 ENCOUNTER — Encounter: Payer: Self-pay | Admitting: Cardiovascular Disease

## 2021-01-13 ENCOUNTER — Other Ambulatory Visit: Payer: Self-pay

## 2021-01-13 VITALS — BP 114/60 | HR 80 | Ht 69.0 in | Wt 186.2 lb

## 2021-01-13 DIAGNOSIS — I2510 Atherosclerotic heart disease of native coronary artery without angina pectoris: Secondary | ICD-10-CM

## 2021-01-13 DIAGNOSIS — I421 Obstructive hypertrophic cardiomyopathy: Secondary | ICD-10-CM

## 2021-01-13 DIAGNOSIS — Z952 Presence of prosthetic heart valve: Secondary | ICD-10-CM | POA: Diagnosis not present

## 2021-01-13 DIAGNOSIS — I442 Atrioventricular block, complete: Secondary | ICD-10-CM

## 2021-01-13 DIAGNOSIS — I4821 Permanent atrial fibrillation: Secondary | ICD-10-CM

## 2021-01-13 DIAGNOSIS — I10 Essential (primary) hypertension: Secondary | ICD-10-CM

## 2021-01-13 NOTE — Patient Instructions (Signed)

## 2021-01-13 NOTE — Progress Notes (Signed)
Cardiology Office Note   Date:  01/13/2021   ID:  [name] [name], [name] [name] Mar 01, 1934, MRN [id number]  PCP:  [name], MD  Cardiologist:   Kathlyn Sacramento, MD   Chief Complaint  Patient presents with  . Other    6 month follow up . Meds reviewed verbally with patient.       History of Present Illness: [name] [name] is a 87 y.o. male who presents for for a follow-up visit. He has extensive past medical history including chronic atrial fibrillation, aortic stenosis, HOCM, hyperlipidemia, hypertension, type 2 diabetes mellitus, sleep apnea, mitral regurgitation, and hypothyroidism.  He was evaluated at [facility] in mid 2019 in the setting of progressive dyspnea and HOCM.  Cardiac MRI showed significant LVH with left ventricular outflow tract obstruction along with severe aortic stenosis and severe mitral regurgitation.  Right and left heart cardiac catheterization showed nonobstructive CAD and normal cardiac index.  He  subsequently underwent successful septal myomectomy with mechanical aortic valve replacement and mechanical mitral valve replacement in August 2019.  Postoperative course complicated by anemia requiring transfusion as well as pleural effusion requiring thoracentesis.  He also had complete heart block which  required placement of a [employer] single lead permanent pacemaker.  He was discharged to rehabilitation and then in September 2019, he was admitted to [facility] in the setting of GI bleeding with supratherapeutic INR.  EGD showed duodenal ulcer with a visible vessel that was treated with hemo-spray.  Follow-up echocardiogram October 2019 showed normal LV function with appropriately functioning mechanical valves and improved pulmonary artery systolic pressure.  In February of 2020, he suffered from vestibular neuritis in the right ear with significant hearing loss and balance problems.  He was seen by ENT and treated with steroids but reports minimal  improvement.  He was also diagnosed with mediastinal lymphadenopathy but he is currently being monitored by oncology.  He has been following up with [facility].  Repeat echo in 2020 showed normal LV systolic function and normal functioning mitral and aortic mechanical valves.  PA pressure was mildly elevated at 39.  No significant change from before.    Has been doing well with no recent chest pain or shortness of breath.  No palpitations or dizziness.   He takes his medications regularly with no side effects with anticoagulation.  His hearing improved with addition of hearing aids.   Past Medical History:  Diagnosis Date  . Aortic stenosis    a. 01/2016 echo: mod AS; b. 04/2018 Cardiac MRI: sev AS; c. 05/2018 s/p mech AVR @ time of myomectomy; d. 07/2018 Echo: Mild AS/AI.  [name] Atherosclerosis of aorta ([place])    by CT scan in past  . Atrial fibrillation ([place])    and aflutter. pt has a left atrial circuit that is not ablated. was on amiodarone-stopped, now use rate control.   . Bladder cancer ([place])    hx; treated with BCG in past   . Carotid artery disease ([place])    There was calcified plaque but no stenosis by carotid artery screening done [facility] October, 2013  . CHB (complete heart block) ([place])    a. 05/2018 s/p BSX VVIR PPM ([facility]) in setting of bradycardia following myomectomy and AVR/MVR.  . Diabetes mellitus    type not specified  . Diabetic neuropathy (HCC)    feet  . Elevated liver enzymes    over time; hx  . Excessive sweating   . Glaucoma   .  Gout   . Head injury    when slipped on ice 2004-2005. stabilized and back on Coumadin   . Headache    migraines - distant past  . History of cardiac cath    a. 05/2018 Cath ([facility]): Non-obstructive CAD. Mildly elevated filling pressures w/ nl CI.  [name] HOCM (hypertrophic obstructive cardiomyopathy) ([place])    a. 01/2016 Echo: EF 65-70%, no rwma, LVOT gradient of 80-54mmHg, mod AS, SAM; b. 11/2017 Echo: EF 55-60%, near cavity  obliteration in systole, mod AS, mild MS; c. 04/2018 Card MRI ([facility]): Sev LVH, EF >70%, Sev AS, LVOT obs w/ Sev MR, mild to mod TR/PR, mid-myocardial HE in basal-mid anteroseptum and inferoseptum; d. 05/2018 s/p Septal myomectomy; e. 07/2018 Echo: EF 60-65%. PASP 33mmHg.  [name] Homocystinemia ([place])    elevated, mild   . Hypercholesterolemia    treated.   . Hypertension   . Infection of right inner ear   . Kidney mass    a. s/p laproscopic surgery woth cryoablation of a mass outside kidney followed at [facility]; b. 11/2018 CT Chest: 2cm R kidney mass.  . Mediastinal adenopathy    a. 12/208 CT Chest: up to 49mm LLL lung nodule. 1.5-1.7cm subcarinal/mediastinal adenopahty/right paratracheal lymph node; b. 11/2018 CT Chest: 2.5-3cm mediastinal adenopathy.  . Mitral regurgitation    a. 04/2018 Cardiac MRI: sev MR; b. 05/2018 s/p mech MVR @ time of myomectomy; c. 07/2018 Echo: Mild MS.  . Motion sickness    ocean boats  . Nausea   . Nonobstructive coronary artery disease    a. 05/2018 Cath ([facility]): nonobs CAD.  [name] Obstructive sleep apnea    CPAP started successfully 2014  . Orthostatic hypotension    a. orthostatic. dehydration. hospitalized 11/11  . Sleep apnea    Significant obstructive sleep apnea diagnosed in August, 2012, the patient is to receive CPAP   . Stroke [facility])    "2 old strokes" CT and MRI. [facility] 11/11. diagnosis was dehydration, no acutal reports.   . TSH elevation    on synthroid historically   . Unsteady gait    August, 2012    Past Surgical History:  Procedure Laterality Date  . BLADDER SURGERY    . CATARACT EXTRACTION W/PHACO Left 05/11/2015   Procedure: CATARACT EXTRACTION PHACO AND INTRAOCULAR LENS PLACEMENT (IOC);  Surgeon: Leandrew Koyanagi, MD;  Location: [place];  Service: Ophthalmology;  Laterality: Left;  DIABETIC - oral meds, CPAP  . COLONOSCOPY WITH PROPOFOL N/A 04/14/2019   Procedure: COLONOSCOPY WITH PROPOFOL;  Surgeon: [name], MD;  Location:  [facility] ENDOSCOPY;  Service: Endoscopy;  Laterality: N/A;  . ESOPHAGOGASTRODUODENOSCOPY (EGD) WITH PROPOFOL N/A 07/01/2018   Procedure: ESOPHAGOGASTRODUODENOSCOPY (EGD) WITH PROPOFOL;  Surgeon: [name], MD;  Location: [facility] ENDOSCOPY;  Service: Endoscopy;  Laterality: N/A;  . INSERT / REPLACE / REMOVE PACEMAKER     Chemical engineer   . KIDNEY SURGERY     "froze mass"  . MECHANICAL AORTIC AND MITRAL VALVE REPLACEMENT  05/2018   [facility]   . TONSILLECTOMY    . VALVE REPLACEMENT       Current Outpatient Medications  Medication Sig Dispense Refill  . ALPRAZolam (XANAX) 0.25 MG tablet Take 1 tablet (0.25 mg total) by mouth every 6 (six) hours as needed for anxiety. 60 tablet 0  . aspirin EC 81 MG tablet Take 1 tablet (81 mg total) by mouth daily.    [name] atorvastatin (LIPITOR) 40 MG tablet Take 1 tablet (40 mg total) by mouth daily.  90 tablet 3  . EUTHYROX 50 MCG tablet Take 1 tablet by mouth once daily 90 tablet 0  . glucose blood (ONE TOUCH ULTRA TEST) test strip USE ONE STRIP TO CHECK GLUCOSE ONCE DAILY 100 each 12  . glucose blood (ONETOUCH ULTRA) test strip USE 1 STRIP TO CHECK GLUCOSE ONCE DAILY 50 each 0  . Lancets (ONETOUCH DELICA PLUS MVHQIO96E) MISC USE 1  TO CHECK GLUCOSE ONCE DAILY 100 each 6  . latanoprost (XALATAN) 0.005 % ophthalmic solution Place 1 drop into both eyes at bedtime.     . meclizine (ANTIVERT) 12.5 MG tablet TAKE 1 TABLET BY MOUTH THREE TIMES DAILY AS NEEDED FOR  DIZZINESS 90 tablet 0  . metFORMIN (GLUCOPHAGE) 1000 MG tablet Take 1 tablet by mouth once daily with breakfast 90 tablet 1  . metoprolol tartrate (LOPRESSOR) 25 MG tablet Take 1/2 (one-half) tablet by mouth twice daily 90 tablet 0  . Multiple Vitamins-Minerals (PRESERVISION AREDS) TABS Take 1 tablet by mouth daily.    Glory Rosebush DELICA LANCETS 95M MISC USE ONE LANCET TO CHECK BLOOD GLUCOSE ONCE DAILY 100 each 12  . pantoprazole (PROTONIX) 40 MG tablet Take 1 tablet by mouth twice daily 180 tablet 2  .  warfarin (COUMADIN) 3 MG tablet Take as directed by the anti-coag clinic 60 tablet 4   No current facility-administered medications for this visit.    Allergies:   Macrolides and ketolides, Mycinettes, Nitrofuran derivatives, Nitrofurantoin, Erythromycin, Zofran [ondansetron hcl-dextrose], and Zofran [ondansetron hcl]    Social History:  The patient  reports that he quit smoking about 46 years ago. His smoking use included cigars and cigarettes. He has never used smokeless tobacco. He reports that he does not drink alcohol and does not use drugs.   Family History:  The patient's family history includes Arrhythmia in his brother and father; Breast cancer in his mother; Prostate cancer in his father; Stroke in his father.    ROS:  Please see the history of present illness.   Otherwise, review of systems are positive for none.   All other systems are reviewed and negative.    PHYSICAL EXAM: VS:  BP 114/60 (BP Location: Left Arm, Patient Position: Sitting, Cuff Size: Normal)   Pulse 80   Ht 5\' 9"  (1.753 m)   Wt 186 lb 4 oz (84.5 kg)   SpO2 98%   BMI 27.50 kg/m  , BMI Body mass index is 27.5 kg/m. GEN: Well nourished, well developed, in no acute distress  HEENT: normal  Neck: no JVD, carotid bruits, or masses Cardiac: Regular rate and rhythm; no rubs, or gallops,no edema .  Normal mechanical heart sound. Respiratory:  clear to auscultation bilaterally, normal work of breathing GI: soft, nontender, nondistended, + BS MS: no deformity or atrophy  Skin: warm and dry, no rash Neuro:  Strength and sensation are intact Psych: euthymic mood, full affect   EKG:  EKG is ordered today. The ekg ordered today demonstrates ventricular paced rhythm with underlying atrial fibrillation.   Recent Labs: 12/07/2020: ALT 17; BUN 21; Creatinine, Ser 0.92; Hemoglobin 12.6; Platelets 165; Potassium 4.3; Sodium 139    Lipid Panel    Component Value Date/Time   CHOL 178 10/25/2020 1006   TRIG 153  (H) 10/25/2020 1006   HDL 41 10/25/2020 1006   CHOLHDL 4.3 10/25/2020 1006   LDLCALC 110 (H) 10/25/2020 1006      Wt Readings from Last 3 Encounters:  01/13/21 186 lb 4 oz (84.5 kg)  12/07/20  190 lb (86.2 kg)  10/24/20 189 lb (85.7 kg)        ASSESSMENT AND PLAN:   1.  Hypertrophic obstructive cardiomyopathy: Status post septal myomectomy at [facility] in 2019 with mechanical mitral and aortic valve replacements.  Doing well overall with no symptoms of heart failure.  2.  Valvular heart disease: Status post mechanical mitral and aortic valve replacements.  He is chronically anticoagulated with Coumadin and followed closely in our Coumadin clinic.  Normal functioning valves on echo in October 2019.  Continue low-dose aspirin as well.  3.  Complete heart block: This developed following septal myomectomy.  He is status post single lead Product/process development scientist and this is followed at [facility].   4.  Permanent atrial fibrillation: Rate controlled on beta-blocker therapy.  Anticoagulated with Coumadin.  5.  Essential hypertension: Blood pressure is controlled on small dose metoprolol.  He is no longer on amlodipine.  5.  Nonobstructive CAD: Minimal CAD on catheterization in August 2019.  Continue aspirin, beta-blocker, and statin therapy.  7.  Hyperlipidemia: He remains on statin therapy.    Disposition:   FU with me in 6 months  Signed,  Kathlyn Sacramento, MD  01/13/2021 9:43 AM    [place]

## 2021-01-18 ENCOUNTER — Ambulatory Visit: Payer: PPO

## 2021-01-18 ENCOUNTER — Other Ambulatory Visit: Payer: Self-pay

## 2021-01-18 DIAGNOSIS — I482 Chronic atrial fibrillation, unspecified: Secondary | ICD-10-CM | POA: Diagnosis not present

## 2021-01-18 DIAGNOSIS — Z950 Presence of cardiac pacemaker: Secondary | ICD-10-CM | POA: Diagnosis not present

## 2021-01-18 DIAGNOSIS — Z952 Presence of prosthetic heart valve: Secondary | ICD-10-CM | POA: Diagnosis not present

## 2021-01-18 DIAGNOSIS — I4891 Unspecified atrial fibrillation: Secondary | ICD-10-CM | POA: Diagnosis not present

## 2021-01-18 DIAGNOSIS — Z5181 Encounter for therapeutic drug level monitoring: Secondary | ICD-10-CM | POA: Diagnosis not present

## 2021-01-18 DIAGNOSIS — I4892 Unspecified atrial flutter: Secondary | ICD-10-CM

## 2021-01-18 LAB — POCT INR: INR: 2.9 (ref 2.0–3.0)

## 2021-01-18 NOTE — Patient Instructions (Signed)
-   continue Warfarin 1.5 tablets every day EXCEPT 1 tablet on [place]. - Recheck INR in 6 weeks.

## 2021-02-01 ENCOUNTER — Other Ambulatory Visit: Payer: Self-pay | Admitting: Family Medicine

## 2021-02-01 DIAGNOSIS — E039 Hypothyroidism, unspecified: Secondary | ICD-10-CM

## 2021-02-01 NOTE — Telephone Encounter (Signed)
Requested medication (s) are due for refill today: yes   Requested medication (s) are on the active medication list: yes   Last refill:  10/12/2020  Future visit scheduled:  no  Notes to clinic:  overdue for labs    Requested Prescriptions  Pending Prescriptions Disp Refills   EUTHYROX 50 MCG tablet [Pharmacy Med Name: Euthyrox 50 MCG Oral Tablet] 90 tablet 0    Sig: Take 1 tablet by mouth once daily      Endocrinology:  Hypothyroid Agents Failed - 02/01/2021  8:44 AM      Failed - TSH needs to be rechecked within 3 months after an abnormal result. Refill until TSH is due.      Failed - TSH in normal range and within 360 days    TSH  Date Value Ref Range Status  07/30/2019 3.300 0.450 - 4.500 uIU/mL Final          Passed - Valid encounter within last 12 months    Recent Outpatient Visits           3 months ago Essential (primary) hypertension   [facility] [name], MD   7 months ago Type 2 diabetes mellitus without complication, without long-term current use of insulin [facility])   [facility] [name], MD   1 year ago Type 2 diabetes mellitus without complication, without long-term current use of insulin [facility])   [facility] [name], MD   1 year ago Type 2 diabetes mellitus without complication, without long-term current use of insulin [facility])   [facility] [name], MD   1 year ago Chronic atrial fibrillation   [facility] [name], MD

## 2021-02-07 DIAGNOSIS — Z950 Presence of cardiac pacemaker: Secondary | ICD-10-CM | POA: Diagnosis not present

## 2021-02-15 ENCOUNTER — Ambulatory Visit: Payer: PPO

## 2021-02-15 DIAGNOSIS — I4892 Unspecified atrial flutter: Secondary | ICD-10-CM

## 2021-02-15 DIAGNOSIS — I482 Chronic atrial fibrillation, unspecified: Secondary | ICD-10-CM | POA: Diagnosis not present

## 2021-02-15 DIAGNOSIS — I4891 Unspecified atrial fibrillation: Secondary | ICD-10-CM

## 2021-02-15 DIAGNOSIS — Z950 Presence of cardiac pacemaker: Secondary | ICD-10-CM

## 2021-02-15 DIAGNOSIS — Z5181 Encounter for therapeutic drug level monitoring: Secondary | ICD-10-CM | POA: Diagnosis not present

## 2021-02-15 DIAGNOSIS — Z952 Presence of prosthetic heart valve: Secondary | ICD-10-CM

## 2021-02-15 LAB — POCT INR: INR: 2.7 (ref 2.0–3.0)

## 2021-02-15 NOTE — Patient Instructions (Signed)
-   continue Warfarin 1.5 tablets every day EXCEPT 1 tablet on [place]. - Recheck INR in 6 weeks.

## 2021-03-07 ENCOUNTER — Other Ambulatory Visit: Payer: Self-pay | Admitting: Family Medicine

## 2021-03-07 DIAGNOSIS — E119 Type 2 diabetes mellitus without complications: Secondary | ICD-10-CM

## 2021-03-07 NOTE — Telephone Encounter (Signed)
Requested Prescriptions  Pending Prescriptions Disp Refills  . metFORMIN (GLUCOPHAGE) 1000 MG tablet [Pharmacy Med Name: metFORMIN HCl 1000 MG Oral Tablet] 90 tablet 0    Sig: Take 1 tablet by mouth once daily with breakfast     Endocrinology:  Diabetes - Biguanides Passed - 03/07/2021 10:51 AM      Passed - Cr in normal range and within 360 days    Creatinine, Ser  Date Value Ref Range Status  12/07/2020 0.92 0.61 - 1.24 mg/dL Final         Passed - HBA1C is between 0 and 7.9 and within 180 days    Hemoglobin A1C  Date Value Ref Range Status  03/30/2017 7.4  Final   Hgb A1c MFr Bld  Date Value Ref Range Status  10/25/2020 6.0 (H) 4.8 - 5.6 % Final    Comment:             Prediabetes: 5.7 - 6.4          Diabetes: >6.4          Glycemic control for adults with diabetes: <7.0          Passed - eGFR in normal range and within 360 days    GFR calc Af Amer  Date Value Ref Range Status  10/25/2020 81 >59 mL/min/1.73 Final    Comment:    **In accordance with recommendations from the NKF-ASN Task force,**   [facility] is in the process of updating its eGFR calculation to the   2021 CKD-EPI creatinine equation that estimates kidney function   without a race variable.    GFR, Estimated  Date Value Ref Range Status  12/07/2020 >60 >60 mL/min Final    Comment:    (NOTE) Calculated using the CKD-EPI Creatinine Equation (2021)          Passed - Valid encounter within last 6 months    Recent Outpatient Visits          4 months ago Essential (primary) hypertension   [facility] [name], MD   8 months ago Type 2 diabetes mellitus without complication, without long-term current use of insulin [facility])   [facility] [name], MD   1 year ago Type 2 diabetes mellitus without complication, without long-term current use of insulin [facility])   [facility] [name], MD   1 year ago Type 2 diabetes mellitus  without complication, without long-term current use of insulin [facility])   [facility] [name], MD   1 year ago Chronic atrial fibrillation   [facility] [name], MD

## 2021-03-27 DIAGNOSIS — G4733 Obstructive sleep apnea (adult) (pediatric): Secondary | ICD-10-CM | POA: Diagnosis not present

## 2021-03-27 DIAGNOSIS — D692 Other nonthrombocytopenic purpura: Secondary | ICD-10-CM | POA: Diagnosis not present

## 2021-03-27 DIAGNOSIS — Z7984 Long term (current) use of oral hypoglycemic drugs: Secondary | ICD-10-CM | POA: Diagnosis not present

## 2021-03-27 DIAGNOSIS — I4891 Unspecified atrial fibrillation: Secondary | ICD-10-CM | POA: Diagnosis not present

## 2021-03-27 DIAGNOSIS — E119 Type 2 diabetes mellitus without complications: Secondary | ICD-10-CM | POA: Diagnosis not present

## 2021-03-27 DIAGNOSIS — E039 Hypothyroidism, unspecified: Secondary | ICD-10-CM | POA: Diagnosis not present

## 2021-03-27 DIAGNOSIS — J9610 Chronic respiratory failure, unspecified whether with hypoxia or hypercapnia: Secondary | ICD-10-CM | POA: Diagnosis not present

## 2021-03-27 DIAGNOSIS — Z8551 Personal history of malignant neoplasm of bladder: Secondary | ICD-10-CM | POA: Diagnosis not present

## 2021-03-27 DIAGNOSIS — Z7982 Long term (current) use of aspirin: Secondary | ICD-10-CM | POA: Diagnosis not present

## 2021-03-27 DIAGNOSIS — I700 Atherosclerosis of aorta: Secondary | ICD-10-CM | POA: Diagnosis not present

## 2021-03-27 DIAGNOSIS — Z7901 Long term (current) use of anticoagulants: Secondary | ICD-10-CM | POA: Diagnosis not present

## 2021-03-27 DIAGNOSIS — Z85528 Personal history of other malignant neoplasm of kidney: Secondary | ICD-10-CM | POA: Diagnosis not present

## 2021-03-29 ENCOUNTER — Ambulatory Visit: Payer: PPO

## 2021-03-29 ENCOUNTER — Other Ambulatory Visit: Payer: Self-pay

## 2021-03-29 DIAGNOSIS — Z950 Presence of cardiac pacemaker: Secondary | ICD-10-CM | POA: Diagnosis not present

## 2021-03-29 DIAGNOSIS — I4891 Unspecified atrial fibrillation: Secondary | ICD-10-CM | POA: Diagnosis not present

## 2021-03-29 DIAGNOSIS — I4892 Unspecified atrial flutter: Secondary | ICD-10-CM

## 2021-03-29 DIAGNOSIS — Z5181 Encounter for therapeutic drug level monitoring: Secondary | ICD-10-CM | POA: Diagnosis not present

## 2021-03-29 DIAGNOSIS — I482 Chronic atrial fibrillation, unspecified: Secondary | ICD-10-CM

## 2021-03-29 DIAGNOSIS — Z952 Presence of prosthetic heart valve: Secondary | ICD-10-CM | POA: Diagnosis not present

## 2021-03-29 LAB — POCT INR: INR: 2.4 (ref 2.0–3.0)

## 2021-03-29 NOTE — Patient Instructions (Signed)
-   take 2 tablets warfarin tonight - continue Warfarin 1.5 tablets every day EXCEPT 1 tablet on MONDAYS & FRIDAYS. - Recheck INR in 6 weeks.

## 2021-03-30 ENCOUNTER — Other Ambulatory Visit: Payer: Self-pay | Admitting: Family Medicine

## 2021-03-30 ENCOUNTER — Other Ambulatory Visit: Payer: Self-pay | Admitting: Cardiovascular Disease

## 2021-03-30 DIAGNOSIS — E119 Type 2 diabetes mellitus without complications: Secondary | ICD-10-CM

## 2021-04-12 DIAGNOSIS — E119 Type 2 diabetes mellitus without complications: Secondary | ICD-10-CM | POA: Diagnosis not present

## 2021-04-12 DIAGNOSIS — B351 Tinea unguium: Secondary | ICD-10-CM | POA: Diagnosis not present

## 2021-04-24 ENCOUNTER — Other Ambulatory Visit: Payer: Self-pay

## 2021-04-24 ENCOUNTER — Emergency Department: Payer: PPO

## 2021-04-24 ENCOUNTER — Emergency Department
Admission: EM | Admit: 2021-04-24 | Discharge: 2021-04-24 | Disposition: A | Discharge status: Home / Self Care | Payer: PPO | Attending: Emergency Medicine | Admitting: Emergency Medicine

## 2021-04-24 DIAGNOSIS — Y92009 Unspecified place in unspecified non-institutional (private) residence as the place of occurrence of the external cause: Secondary | ICD-10-CM | POA: Diagnosis not present

## 2021-04-24 DIAGNOSIS — S34101A Unspecified injury to L1 level of lumbar spinal cord, initial encounter: Secondary | ICD-10-CM | POA: Diagnosis present

## 2021-04-24 DIAGNOSIS — Z950 Presence of cardiac pacemaker: Secondary | ICD-10-CM | POA: Diagnosis not present

## 2021-04-24 DIAGNOSIS — I10 Essential (primary) hypertension: Secondary | ICD-10-CM | POA: Insufficient documentation

## 2021-04-24 DIAGNOSIS — E119 Type 2 diabetes mellitus without complications: Secondary | ICD-10-CM | POA: Diagnosis not present

## 2021-04-24 DIAGNOSIS — W19XXXA Unspecified fall, initial encounter: Secondary | ICD-10-CM | POA: Insufficient documentation

## 2021-04-24 DIAGNOSIS — I482 Chronic atrial fibrillation, unspecified: Secondary | ICD-10-CM | POA: Diagnosis not present

## 2021-04-24 DIAGNOSIS — Z7902 Long term (current) use of antithrombotics/antiplatelets: Secondary | ICD-10-CM | POA: Insufficient documentation

## 2021-04-24 DIAGNOSIS — E039 Hypothyroidism, unspecified: Secondary | ICD-10-CM | POA: Insufficient documentation

## 2021-04-24 DIAGNOSIS — Z7984 Long term (current) use of oral hypoglycemic drugs: Secondary | ICD-10-CM | POA: Insufficient documentation

## 2021-04-24 DIAGNOSIS — Z79899 Other long term (current) drug therapy: Secondary | ICD-10-CM | POA: Diagnosis not present

## 2021-04-24 DIAGNOSIS — M545 Low back pain, unspecified: Secondary | ICD-10-CM | POA: Diagnosis not present

## 2021-04-24 DIAGNOSIS — Z8551 Personal history of malignant neoplasm of bladder: Secondary | ICD-10-CM | POA: Diagnosis not present

## 2021-04-24 DIAGNOSIS — R0902 Hypoxemia: Secondary | ICD-10-CM | POA: Diagnosis not present

## 2021-04-24 DIAGNOSIS — Z7982 Long term (current) use of aspirin: Secondary | ICD-10-CM | POA: Diagnosis not present

## 2021-04-24 DIAGNOSIS — Z85520 Personal history of malignant carcinoid tumor of kidney: Secondary | ICD-10-CM | POA: Insufficient documentation

## 2021-04-24 DIAGNOSIS — S32010A Wedge compression fracture of first lumbar vertebra, initial encounter for closed fracture: Secondary | ICD-10-CM | POA: Diagnosis not present

## 2021-04-24 MED ORDER — HYDROCODONE-ACETAMINOPHEN 5-325 MG PO TABS: 1.0000 | ORAL_TABLET | ORAL | 0 refills | Status: DC | PRN

## 2021-04-24 MED ORDER — LIDOCAINE 5 % EX PTCH
1.0000 | MEDICATED_PATCH | CUTANEOUS | Status: DC
  Administered 2021-04-24: 1 via TRANSDERMAL
  Filled 2021-04-24: qty 1

## 2021-04-24 MED ORDER — HYDROCODONE-ACETAMINOPHEN 5-325 MG PO TABS
1.0000 | ORAL_TABLET | Freq: Once | ORAL | Status: AC
  Administered 2021-04-24: 1 via ORAL
  Filled 2021-04-24: qty 1

## 2021-04-24 NOTE — ED Triage Notes (Signed)
Pt comes via EMS from home with c/o mechanical fall. Pt states lower back pain. Lac to left shin bleeding controlled.  Pt denies any LOC or hitting head. Pt is on blood thinners.

## 2021-04-24 NOTE — ED Notes (Signed)
Left shin and right knee wounds cleaned with NS

## 2021-04-24 NOTE — ED Notes (Signed)
Says he went outside this am and was trying to fix his wife's bottle tree that had fallen over.  He ended up falling and then he could not get up.  He has bruise on left knee and small laceration to left shin area.  He is alert and oriented.

## 2021-04-24 NOTE — ED Provider Notes (Signed)
[facility] Emergency Department Provider Note   ____________________________________________   Event Date/Time   First MD Initiated Contact with Patient 04/24/21 1309     (approximate)  I have reviewed the triage vital signs and the nursing notes.   HISTORY  Chief Complaint Fall    HPI [name] [name] is a 87 y.o. male with past medical history of hypertension, hyperlipidemia, diabetes, chronic atrial fibrillation on Coumadin who presents to the ED following fall.  Patient states that he was trying to stand his wife's "bottle tree" back up when he lost his balance and fell backwards.  He reports falling onto his buttocks and back, adamantly denies hitting his head or losing consciousness.  He had a hard time getting back up onto his feet and required the help of bystanders, but was able to briefly stand while transitioning to EMS stretcher.  He now complains of pain to both sides of his lower back as well as the middle of his upper back.  He denies any headache, neck pain, vision changes, numbness, weakness, chest pain, or abdominal pain.  He reports scraping both knees but denies any pain in either his arms or legs.        Past Medical History:  Diagnosis Date   Aortic stenosis    a. 01/2016 echo: mod AS; b. 04/2018 Cardiac MRI: sev AS; c. 05/2018 s/p mech AVR @ time of myomectomy; d. 07/2018 Echo: Mild AS/AI.   Atherosclerosis of aorta ([place])    by CT scan in past   Atrial fibrillation (HCC)    and aflutter. pt has a left atrial circuit that is not ablated. was on amiodarone-stopped, now use rate control.    Bladder cancer ([place])    hx; treated with BCG in past    Carotid artery disease ([place])    There was calcified plaque but no stenosis by carotid artery screening done at [facility] October, 2013   CHB (complete heart block) [facility])    a. 05/2018 s/p BSX VVIR PPM ([facility]) in setting of bradycardia following myomectomy and AVR/MVR.    Diabetes mellitus    type not specified   Diabetic neuropathy ([place])    feet   Elevated liver enzymes    over time; hx   Excessive sweating    Glaucoma    Gout    Head injury    when slipped on ice 2004-2005. stabilized and back on Coumadin    Headache    migraines - distant past   History of cardiac cath    a. 05/2018 Cath ([facility]): Non-obstructive CAD. Mildly elevated filling pressures w/ nl CI.   HOCM (hypertrophic obstructive cardiomyopathy) ([place])    a. 01/2016 Echo: EF 65-70%, no rwma, LVOT gradient of 80-26mmHg, mod AS, SAM; b. 11/2017 Echo: EF 55-60%, near cavity obliteration in systole, mod AS, mild MS; c. 04/2018 Card MRI ([facility]): Sev LVH, EF >70%, Sev AS, LVOT obs w/ Sev MR, mild to mod TR/PR, mid-myocardial HE in basal-mid anteroseptum and inferoseptum; d. 05/2018 s/p Septal myomectomy; e. 07/2018 Echo: EF 60-65%. PASP 49mmHg.   Homocystinemia ([place])    elevated, mild    Hypercholesterolemia    treated.    Hypertension    Infection of right inner ear    Kidney mass    a. s/p laproscopic surgery woth cryoablation of a mass outside kidney followed at [facility]; b. 11/2018 CT Chest: 2cm R kidney mass.   Mediastinal adenopathy    a. 12/208 CT Chest:  up to 88mm LLL lung nodule. 1.5-1.7cm subcarinal/mediastinal adenopahty/right paratracheal lymph node; b. 11/2018 CT Chest: 2.5-3cm mediastinal adenopathy.   Mitral regurgitation    a. 04/2018 Cardiac MRI: sev MR; b. 05/2018 s/p mech MVR @ time of myomectomy; c. 07/2018 Echo: Mild MS.   Motion sickness    ocean boats   Nausea    Nonobstructive coronary artery disease    a. 05/2018 Cath ([facility]): nonobs CAD.   Obstructive sleep apnea    CPAP started successfully 2014   Orthostatic hypotension    a. orthostatic. dehydration. hospitalized 11/11   Sleep apnea    Significant obstructive sleep apnea diagnosed in August, 2012, the patient is to receive CPAP    Stroke [facility])    "2 old strokes" CT and MRI. [facility] 11/11. diagnosis was  dehydration, no acutal reports.    TSH elevation    on synthroid historically    Unsteady gait    August, 2012    Patient Active Problem List   Diagnosis Date Noted   Abnormal CT scan    Abnormal findings on radiological examination of gastrointestinal tract    Polyp of colon    Cancer of right kidney ([place]) 12/10/2018   Pulmonary nodules 12/01/2018   Mediastinal lymphadenopathy 12/01/2018   Heart block AV third degree ([place]) 10/07/2018   Pacemaker 10/07/2018   H/O aortic valve replacement 10/07/2018   Blood in stool    Bleeding duodenal ulcer    GIB (gastrointestinal bleeding) 06/30/2018   Acute upper GI bleed    Symptomatic anemia    Heart murmur, systolic 40/98/1191   H/O: rheumatic fever 03/21/2017   Tinnitus 10/30/2016   On warfarin therapy 07/06/2015   Arteriosclerosis of coronary artery 06/13/2015   Carcinoma in situ of bladder 06/13/2015   Diabetes mellitus, type 2 ([place]) 06/13/2015   Diabetic neuropathy ([place]) 06/13/2015   Dizziness 06/13/2015   Essential (primary) hypertension 06/13/2015   Fatigue 06/13/2015   Glaucoma 06/13/2015   Hypercholesteremia 06/13/2015   Male hypogonadism 06/13/2015   Adult hypothyroidism 06/13/2015   Arthritis, degenerative 06/13/2015   CAP (community acquired pneumonia) 06/13/2015   Adenocarcinoma, renal cell ([place]) 06/13/2015   Benign head tremor 12/13/2014   Carotid arterial disease ([place]) 07/12/2014   Decreased cardiac output 07/12/2014   Cardiomyopathy, hypertrophic obstructive ([place]) 07/12/2014   Head injuries 07/12/2014   HOCM (hypertrophic obstructive cardiomyopathy) ([place])    Encounter for therapeutic drug monitoring 11/16/2013   Obstructive sleep apnea    Benign prostatic hypertrophy without urinary obstruction 06/09/2012   History of neoplasm of bladder 06/09/2012   Head injury    Atherosclerosis of aorta (HCC)    Hypotension    Stroke ([place])    Unsteady gait    Excessive sweating    Nausea    ORTHOSTATIC HYPOTENSION, HX  OF 09/01/2010   Overweight(278.02) 03/07/2009   MITRAL REGURGITATION 03/05/2009   Chronic atrial fibrillation ([place]) 03/05/2009   Atrial fibrillation and flutter ([place]) 03/05/2009   Mitral and aortic incompetence 03/05/2009    Past Surgical History:  Procedure Laterality Date   BLADDER SURGERY     CATARACT EXTRACTION W/PHACO Left 05/11/2015   Procedure: CATARACT EXTRACTION PHACO AND INTRAOCULAR LENS PLACEMENT ([place]);  Surgeon: Leandrew Koyanagi, MD;  Location: [place];  Service: Ophthalmology;  Laterality: Left;  DIABETIC - oral meds, CPAP   COLONOSCOPY WITH PROPOFOL N/A 04/14/2019   Procedure: COLONOSCOPY WITH PROPOFOL;  Surgeon: [name], MD;  Location[facility] ENDOSCOPY;  Service: Endoscopy;  Laterality: N/A;   ESOPHAGOGASTRODUODENOSCOPY (  EGD) WITH PROPOFOL N/A 07/01/2018   Procedure: ESOPHAGOGASTRODUODENOSCOPY (EGD) WITH PROPOFOL;  Surgeon: [name], MD;  Location: [facility] ENDOSCOPY;  Service: Endoscopy;  Laterality: N/A;   INSERT / REPLACE / [place]     "froze mass"   MECHANICAL AORTIC AND MITRAL VALVE REPLACEMENT  05/2018   [facility]    TONSILLECTOMY     VALVE REPLACEMENT      Prior to Admission medications   Medication Sig Start Date End Date Taking? Authorizing Provider  EUTHYROX 50 MCG tablet Take 1 tablet by mouth once daily 02/01/21   [name], MD  HYDROcodone-acetaminophen (NORCO/VICODIN) 5-325 MG tablet Take 1 tablet by mouth every 4 (four) hours as needed for moderate pain. 04/24/21 04/24/22 Yes Blake Divine, MD  ALPRAZolam Duanne Moron) 0.25 MG tablet Take 1 tablet (0.25 mg total) by mouth every 6 (six) hours as needed for anxiety. 12/17/18   [name], MD  aspirin EC 81 MG tablet Take 1 tablet (81 mg total) by mouth daily. 09/04/18   Deboraha Sprang, MD  atorvastatin (LIPITOR) 40 MG tablet Take 1 tablet (40 mg total) by mouth daily. 11/01/20   [name], MD  glucose blood [facility]  ULTRA) test strip USE 1 STRIP TO CHECK GLUCOSE ONCE DAILY 11/02/20   [name], MD  Lancets [facility] DELICA PLUS [id number]) MISC USE 1  TO CHECK GLUCOSE ONCE DAILY 08/06/20   [name], MD  latanoprost (XALATAN) 0.005 % ophthalmic solution Place 1 drop into both eyes at bedtime.  11/13/13   [provider]  meclizine (ANTIVERT) 12.5 MG tablet TAKE 1 TABLET BY MOUTH THREE TIMES DAILY AS NEEDED FOR  DIZZINESS 12/06/20   [name], MD  metFORMIN (GLUCOPHAGE) 1000 MG tablet Take 1 tablet by mouth once daily with breakfast 03/07/21   [name], MD  metoprolol tartrate (LOPRESSOR) 25 MG tablet Take 1/2 (one-half) tablet by mouth twice daily 03/30/21   Wellington Hampshire, MD  Multiple Vitamins-Minerals (PRESERVISION AREDS) TABS Take 1 tablet by mouth daily.    [provider]  Jonetta Speak LANCETS [address] USE ONE LANCET TO CHECK BLOOD GLUCOSE ONCE DAILY 03/25/17   [name], MD  [facility] ULTRA test strip USE 1 STRIP TO CHECK GLUCOSE ONCE DAILY 03/30/21   [name], MD  pantoprazole (PROTONIX) 40 MG tablet Take 1 tablet by mouth twice daily 06/07/20   [name], MD  warfarin (COUMADIN) 3 MG tablet Take as directed by the anti-coag clinic 12/07/20   Minna Merritts, MD    Allergies Macrolides and ketolides, Mycinettes, Nitrofuran derivatives, Nitrofurantoin, Erythromycin, Zofran [ondansetron hcl-dextrose], and Zofran [ondansetron hcl]  Family History  Problem Relation Age of Onset   Arrhythmia Father        A-Fib   Prostate cancer Father    Stroke Father    Breast cancer Mother    Arrhythmia Brother        A-Fib   Heart attack Neg Hx    Hypertension Neg Hx     Social History Social History   Tobacco Use   Smoking status: Former    Pack years: 0.00    Types: Cigars, Cigarettes    Quit date: 10/02/1974    Years since quitting: 46.5   Smokeless tobacco: Never   Tobacco comments:     still smokes cigars once  a day  Vaping Use   Vaping Use: Never used  Substance Use Topics   Alcohol use: No   Drug use: No    Review of Systems  Constitutional: No fever/chills Eyes: No visual changes. ENT: No sore throat. Cardiovascular: Denies chest pain. Respiratory: Denies shortness of breath. Gastrointestinal: No abdominal pain.  No nausea, no vomiting.  No diarrhea.  No constipation. Genitourinary: Negative for dysuria. Musculoskeletal: Positive for back pain. Skin: Negative for rash. Neurological: Negative for headaches, focal weakness or numbness.  ____________________________________________   PHYSICAL EXAM:  VITAL SIGNS: ED Triage Vitals  Enc Vitals Group     BP 04/24/21 1238 (!) 154/69     Pulse Rate 04/24/21 1238 77     Resp 04/24/21 1238 16     Temp 04/24/21 1238 99.1 F (37.3 C)     Temp Source 04/24/21 1238 Oral     SpO2 04/24/21 1238 94 %     Weight --      Height --      Head Circumference --      Peak Flow --      Pain Score 04/24/21 1222 5     Pain Loc --      Pain Edu? --      Excl. in [place]? --     Constitutional: Alert and oriented. Eyes: Conjunctivae are normal. Head: Atraumatic. Nose: No congestion/rhinnorhea. Mouth/Throat: Mucous membranes are moist. Neck: Normal ROM Cardiovascular: Normal rate, regular rhythm. Grossly normal heart sounds.  2+ radial pulses bilaterally. Respiratory: Normal respiratory effort.  No retractions. Lungs CTAB. Gastrointestinal: Soft and nontender. No distention. Genitourinary: deferred Musculoskeletal: Midline thoracic and lumbar spinal tenderness to palpation with lumbar paraspinal tenderness to palpation and bilateral lower posterior chest wall tenderness to palpation.  No lower extremity tenderness nor edema.  No upper extremity bony tenderness to palpation. Neurologic:  Normal speech and language. No gross focal neurologic deficits are appreciated. Skin:  Skin is warm, dry and intact. No rash noted.   Abrasions to bilateral knees. Psychiatric: Mood and affect are normal. Speech and behavior are normal.  ____________________________________________   LABS (all labs ordered are listed, but only abnormal results are displayed)  Labs Reviewed - No data to display  PROCEDURES  Procedure(s) performed (including Critical Care):  Procedures   ____________________________________________   INITIAL IMPRESSION / ASSESSMENT AND PLAN / ED COURSE      87 year old male with past medical history of hypertension, hyperlipidemia, diabetes, and chronic atrial fibrillation on Coumadin who presents to the ED after losing his balance and falling backwards, striking his back.  He adamantly denies hitting his head or losing consciousness, has no focal neurologic deficits on exam.  He does have tenderness to his midline thoracic and lumbar spine which we will further assess with x-ray.  Also plan to check chest x-ray given his posterior chest wall tenderness.  We will treat pain with Lidoderm patch and reassess following imaging.  X-ray reviewed by me and is concerning for lumbar compression fracture.  Findings were discussed with Dr. Lacinda Axon of neurosurgery, who agrees with plan for placement of TLSO brace and outpatient follow-up.  Patient was given Norco for additional pain control and is now more comfortable following brace placement.  He is appropriate for discharge home, was counseled to return to the ED for new worsening symptoms.  Patient agrees with plan.      ____________________________________________   FINAL CLINICAL IMPRESSION(S) / ED DIAGNOSES  Final diagnoses:  Fall, initial encounter  Compression fracture  of L1 vertebra, initial encounter [facility])     ED Discharge Orders          Ordered    HYDROcodone-acetaminophen (NORCO/VICODIN) 5-325 MG tablet  Every 4 hours PRN        04/24/21 1704             Note:  This document was prepared using Dragon voice recognition software  and may include unintentional dictation errors.    Blake Divine, MD 04/24/21 [phone]

## 2021-04-24 NOTE — ED Notes (Signed)
Beverage provided to pt. 

## 2021-04-25 ENCOUNTER — Other Ambulatory Visit: Payer: Self-pay | Admitting: Family Medicine

## 2021-04-25 DIAGNOSIS — E039 Hypothyroidism, unspecified: Secondary | ICD-10-CM

## 2021-04-26 NOTE — Telephone Encounter (Signed)
Requested medication (s) are due for refill today: yes  Requested medication (s) are on the active medication list: yes  Last refill: 02/01/2021  Future visit scheduled: no  Notes to clinic:  Failed protocol:  TSH needs to be rechecked within 3 months after an abnormal result. Refill until TSH is due  Requested Prescriptions  Pending Prescriptions Disp Refills   EUTHYROX 50 MCG tablet [Pharmacy Med Name: Euthyrox 50 MCG Oral Tablet] 90 tablet 0    Sig: Take 1 tablet by mouth once daily      Endocrinology:  Hypothyroid Agents Failed - 04/25/2021  9:40 PM      Failed - TSH needs to be rechecked within 3 months after an abnormal result. Refill until TSH is due.      Failed - TSH in normal range and within 360 days    TSH  Date Value Ref Range Status  07/30/2019 3.300 0.450 - 4.500 uIU/mL Final          Passed - Valid encounter within last 12 months    Recent Outpatient Visits           6 months ago Essential (primary) hypertension   [facility] [name], MD   10 months ago Type 2 diabetes mellitus without complication, without long-term current use of insulin [facility])   [facility] [name], MD   1 year ago Type 2 diabetes mellitus without complication, without long-term current use of insulin [facility])   [facility] [name], MD   1 year ago Type 2 diabetes mellitus without complication, without long-term current use of insulin [facility])   [facility] [name], MD   1 year ago Chronic atrial fibrillation   [facility] [name], MD

## 2021-05-01 NOTE — Telephone Encounter (Signed)
Patient's wife is calling to check to see why the Thyroid medication has not been filled. Advised lab over due- appointment has been scheduled for medication follow up. Patient has been on this dosing for a long time. Advised will send notification to office

## 2021-05-10 ENCOUNTER — Other Ambulatory Visit: Payer: Self-pay

## 2021-05-10 ENCOUNTER — Ambulatory Visit (INDEPENDENT_AMBULATORY_CARE_PROVIDER_SITE_OTHER): Payer: PPO

## 2021-05-10 DIAGNOSIS — I482 Chronic atrial fibrillation, unspecified: Secondary | ICD-10-CM | POA: Diagnosis not present

## 2021-05-10 DIAGNOSIS — Z950 Presence of cardiac pacemaker: Secondary | ICD-10-CM | POA: Diagnosis not present

## 2021-05-10 DIAGNOSIS — I4891 Unspecified atrial fibrillation: Secondary | ICD-10-CM

## 2021-05-10 DIAGNOSIS — I4892 Unspecified atrial flutter: Secondary | ICD-10-CM

## 2021-05-10 DIAGNOSIS — Z5181 Encounter for therapeutic drug level monitoring: Secondary | ICD-10-CM

## 2021-05-10 DIAGNOSIS — Z952 Presence of prosthetic heart valve: Secondary | ICD-10-CM | POA: Diagnosis not present

## 2021-05-10 LAB — POCT INR: INR: 3.1 — AB (ref 2.0–3.0)

## 2021-05-10 NOTE — Patient Instructions (Signed)
-   continue Warfarin 1.5 tablets every day EXCEPT 1 tablet on [place]. - Recheck INR in 6 weeks.

## 2021-05-16 ENCOUNTER — Ambulatory Visit: Payer: Self-pay | Admitting: Family Medicine

## 2021-05-16 DIAGNOSIS — M419 Scoliosis, unspecified: Secondary | ICD-10-CM | POA: Diagnosis not present

## 2021-05-16 DIAGNOSIS — M160 Bilateral primary osteoarthritis of hip: Secondary | ICD-10-CM | POA: Diagnosis not present

## 2021-05-16 DIAGNOSIS — M47816 Spondylosis without myelopathy or radiculopathy, lumbar region: Secondary | ICD-10-CM | POA: Diagnosis not present

## 2021-05-16 DIAGNOSIS — S32010A Wedge compression fracture of first lumbar vertebra, initial encounter for closed fracture: Secondary | ICD-10-CM | POA: Diagnosis not present

## 2021-05-23 DIAGNOSIS — Z23 Encounter for immunization: Secondary | ICD-10-CM | POA: Diagnosis not present

## 2021-05-30 ENCOUNTER — Telehealth: Payer: Self-pay | Admitting: *Deleted

## 2021-05-30 ENCOUNTER — Ambulatory Visit: Payer: Self-pay | Admitting: Family Medicine

## 2021-05-30 NOTE — Telephone Encounter (Signed)
Please advise 

## 2021-05-30 NOTE — Telephone Encounter (Signed)
Copied from [place] [id number]. Topic: General - Call Back - No Documentation >> May 25, 2021  1:19 PM Erick Blinks wrote: Reason for CRM: Pt is requesting a list of the patients health conditions and medications, he is applying for assistance with the [place]. She says this is typically posted on her the first page of his AVS. She is requesting to speak to the office. She would like to know when he was initially diagnosed with Diabetes.   Best contact: [phone]  Please advise, she says she needs this as soon as possible. She wants it sent via text message if possible (?)

## 2021-05-31 ENCOUNTER — Telehealth: Payer: Self-pay

## 2021-05-31 DIAGNOSIS — G4733 Obstructive sleep apnea (adult) (pediatric): Secondary | ICD-10-CM | POA: Diagnosis not present

## 2021-05-31 NOTE — Telephone Encounter (Signed)
Please review.  It looks like you have an opening 06/12/2021 at 3:40 but it seems blocked.  Is it okay to schedule pt for that time?  Thanks,   -Mickel Baas

## 2021-05-31 NOTE — Telephone Encounter (Signed)
Copied from [place] [id number]. Topic: Appointment Scheduling - Scheduling Inquiry for Clinic >> May 31, 2021  4:26 PM Oneta Rack wrote: Reason for CRM: patient is scheduled for 06/05/2021 with PCP, patient had to cancel because he is out of town due to mold in his house, caller states PCP is aware. Patient would like to come in on 06/12/2021 at 3:40pm due to his wife seeing Dr. Jacinto Reap on 06/12/2021 at 3:20p. Caller would like a follow up call regarding the possible work in

## 2021-06-05 ENCOUNTER — Ambulatory Visit: Payer: PPO | Admitting: Family Medicine

## 2021-06-05 NOTE — Progress Notes (Deleted)
Established patient visit   Patient: [name] [name]   DOB: May 14, 1934   87 y.o. Male  MRN: [id number] Visit Date: 06/05/2021  Today's healthcare provider: [name], MD   No chief complaint on file.  Subjective  -------------------------------------------------------------------------------------------------------------------- HPI  Diabetes Mellitus Type II, follow-up  Lab Results  Component Value Date   HGBA1C 6.0 (H) 10/25/2020   HGBA1C 6.2 (A) 06/15/2020   HGBA1C 6.6 (A) 12/14/2019   Last seen for diabetes 7 months ago.  Management since then includes continuing the same treatment. He reports {excellent/good/fair/poor:19665} compliance with treatment. He {is/is not:21021397} having side effects. {document side effects if present:1}  Home blood sugar records: {diabetes glucometry results:16657}  Episodes of hypoglycemia? {Yes/No:20286} {enter details if yes:1}   Current insulin regiment: {***Type 'None' if not taking insulin                                                otherwise enter complete                                                 details of insulin regiment:1} Most Recent Eye Exam: ***  --------------------------------------------------------------------------------------------------- Hypertension, follow-up  BP Readings from Last 3 Encounters:  04/24/21 131/71  01/13/21 114/60  12/07/20 139/65   Wt Readings from Last 3 Encounters:  01/13/21 186 lb 4 oz (84.5 kg)  12/07/20 190 lb (86.2 kg)  10/24/20 189 lb (85.7 kg)     He was last seen for hypertension 7 months ago.  BP at that visit was 129/54. Management since that visit includes; Good control. He reports {excellent/good/fair/poor:19665} compliance with treatment. He {is/is not:9024} having side effects. {document side effects if present:1} He {is/is not:9024} exercising. He {is/is not:9024} adherent to low salt diet.   Outside blood pressures are {enter patient reported home  BP, or 'not being checked':1}.  He {does/does not:200015} smoke.  Use of agents associated with hypertension: {bp agents assoc with hypertension:511::"none"}.   --------------------------------------------------------------------------------------------------- Lipid/Cholesterol, follow-up  Last Lipid Panel: Lab Results  Component Value Date   CHOL 178 10/25/2020   LDLCALC 110 (H) 10/25/2020   HDL 41 10/25/2020   TRIG 153 (H) 10/25/2020    He was last seen for this 7 months ago.  Management since that visit includes; labs checked showing-okay but cholesterol little bit high. Changed simvastatin 40 to rosuvastatin 40 mg daily.  He reports {excellent/good/fair/poor:19665} compliance with treatment. He {is/is not:9024} having side effects. {document side effects if present:1}  He is following a {diet:21022986} diet. Current exercise: {exercise 99991111  Last metabolic panel Lab Results  Component Value Date   GLUCOSE 138 (H) 12/07/2020   NA 139 12/07/2020   K 4.3 12/07/2020   BUN 21 12/07/2020   CREATININE 0.92 12/07/2020   GFRNONAA >60 12/07/2020   GFRAA 81 10/25/2020   CALCIUM 9.0 12/07/2020   AST 26 12/07/2020   ALT 17 12/07/2020   The ASCVD Risk score Mikey Bussing DC Jr., et al., 2013) failed to calculate for the following reasons:   The 2013 ASCVD risk score is only valid for ages 40 to 25   The patient has a prior MI or stroke diagnosis  ---------------------------------------------------------------------------------------------------   {Show patient history (optional):23778}  Medications: Outpatient Medications Prior to Visit  Medication Sig   ALPRAZolam (XANAX) 0.25 MG tablet Take 1 tablet (0.25 mg total) by mouth every 6 (six) hours as needed for anxiety.   aspirin EC 81 MG tablet Take 1 tablet (81 mg total) by mouth daily.   atorvastatin (LIPITOR) 40 MG tablet Take 1 tablet (40 mg total) by mouth daily.   EUTHYROX 50 MCG tablet Take 1 tablet by mouth once  daily   glucose blood (ONETOUCH ULTRA) test strip USE 1 STRIP TO CHECK GLUCOSE ONCE DAILY   HYDROcodone-acetaminophen (NORCO/VICODIN) 5-325 MG tablet Take 1 tablet by mouth every 4 (four) hours as needed for moderate pain.   Lancets (ONETOUCH DELICA PLUS [id number]) MISC USE 1  TO CHECK GLUCOSE ONCE DAILY   latanoprost (XALATAN) 0.005 % ophthalmic solution Place 1 drop into both eyes at bedtime.    meclizine (ANTIVERT) 12.5 MG tablet TAKE 1 TABLET BY MOUTH THREE TIMES DAILY AS NEEDED FOR  DIZZINESS   metFORMIN (GLUCOPHAGE) 1000 MG tablet Take 1 tablet by mouth once daily with breakfast   metoprolol tartrate (LOPRESSOR) 25 MG tablet Take 1/2 (one-half) tablet by mouth twice daily   Multiple Vitamins-Minerals (PRESERVISION AREDS) TABS Take 1 tablet by mouth daily.   ONETOUCH DELICA LANCETS 99991111 MISC USE ONE LANCET TO CHECK BLOOD GLUCOSE ONCE DAILY   ONETOUCH ULTRA test strip USE 1 STRIP TO CHECK GLUCOSE ONCE DAILY   pantoprazole (PROTONIX) 40 MG tablet Take 1 tablet by mouth twice daily   warfarin (COUMADIN) 3 MG tablet Take as directed by the anti-coag clinic   No facility-administered medications prior to visit.    Review of Systems  Constitutional:  Negative for appetite change, chills and fever.  Respiratory:  Negative for chest tightness, shortness of breath and wheezing.   Cardiovascular:  Negative for chest pain and palpitations.  Gastrointestinal:  Negative for abdominal pain, nausea and vomiting.   {Labs  Heme  Chem  Endocrine  Serology  Results Review (optional):23779}   Objective  -------------------------------------------------------------------------------------------------------------------- There were no vitals taken for this visit. {Show previous vital signs (optional):23777}   Physical Exam  ***  No results found for any visits on 06/05/21.  Assessment & Plan   ---------------------------------------------------------------------------------------------------------------------- ***  No follow-ups on file.      {provider attestation***:1}   [name], MD  [facility] [phone] (phone) [phone] (fax)  [place]

## 2021-06-06 ENCOUNTER — Telehealth: Payer: Self-pay | Admitting: Family Medicine

## 2021-06-06 DIAGNOSIS — E119 Type 2 diabetes mellitus without complications: Secondary | ICD-10-CM

## 2021-06-06 DIAGNOSIS — R42 Dizziness and giddiness: Secondary | ICD-10-CM

## 2021-06-06 MED ORDER — MECLIZINE HCL 12.5 MG PO TABS: ORAL_TABLET | ORAL | 0 refills | Status: DC

## 2021-06-06 MED ORDER — METFORMIN HCL 1000 MG PO TABS: 1000.0000 mg | ORAL_TABLET | Freq: Every day | ORAL | 0 refills | Status: DC

## 2021-06-06 NOTE — Telephone Encounter (Signed)
[place] faxed refill request for the following medications:    metFORMIN (GLUCOPHAGE) 1000 MG tablet   meclizine (ANTIVERT) 12.5 MG tablet    Please advise.

## 2021-06-07 ENCOUNTER — Inpatient Hospital Stay: Payer: PPO | Admitting: Internal Medicine

## 2021-06-07 ENCOUNTER — Inpatient Hospital Stay: Payer: PPO

## 2021-06-12 ENCOUNTER — Ambulatory Visit (INDEPENDENT_AMBULATORY_CARE_PROVIDER_SITE_OTHER): Payer: PPO | Admitting: Family Medicine

## 2021-06-12 ENCOUNTER — Encounter: Payer: Self-pay | Admitting: Family Medicine

## 2021-06-12 ENCOUNTER — Other Ambulatory Visit: Payer: Self-pay

## 2021-06-12 VITALS — BP 127/67 | HR 78 | Resp 16 | Ht 69.0 in | Wt 180.0 lb

## 2021-06-12 DIAGNOSIS — G3184 Mild cognitive impairment, so stated: Secondary | ICD-10-CM

## 2021-06-12 DIAGNOSIS — I421 Obstructive hypertrophic cardiomyopathy: Secondary | ICD-10-CM | POA: Diagnosis not present

## 2021-06-12 DIAGNOSIS — Z952 Presence of prosthetic heart valve: Secondary | ICD-10-CM

## 2021-06-12 DIAGNOSIS — E7800 Pure hypercholesterolemia, unspecified: Secondary | ICD-10-CM | POA: Diagnosis not present

## 2021-06-12 DIAGNOSIS — E1142 Type 2 diabetes mellitus with diabetic polyneuropathy: Secondary | ICD-10-CM

## 2021-06-12 DIAGNOSIS — D090 Carcinoma in situ of bladder: Secondary | ICD-10-CM

## 2021-06-12 DIAGNOSIS — E119 Type 2 diabetes mellitus without complications: Secondary | ICD-10-CM

## 2021-06-12 DIAGNOSIS — I482 Chronic atrial fibrillation, unspecified: Secondary | ICD-10-CM

## 2021-06-12 DIAGNOSIS — C641 Malignant neoplasm of right kidney, except renal pelvis: Secondary | ICD-10-CM | POA: Diagnosis not present

## 2021-06-12 DIAGNOSIS — M8008XA Age-related osteoporosis with current pathological fracture, vertebra(e), initial encounter for fracture: Secondary | ICD-10-CM

## 2021-06-12 DIAGNOSIS — R5383 Other fatigue: Secondary | ICD-10-CM | POA: Diagnosis not present

## 2021-06-12 LAB — POCT GLYCOSYLATED HEMOGLOBIN (HGB A1C)
Est. average glucose Bld gHb Est-mCnc: 128
Hemoglobin A1C: 6.1 % — AB (ref 4.0–5.6)
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
## 2021-06-14 ENCOUNTER — Inpatient Hospital Stay: Payer: PPO | Admitting: Internal Medicine

## 2021-06-14 ENCOUNTER — Other Ambulatory Visit: Payer: Self-pay

## 2021-06-14 ENCOUNTER — Inpatient Hospital Stay: Payer: PPO | Attending: Internal Medicine

## 2021-06-14 DIAGNOSIS — R590 Localized enlarged lymph nodes: Secondary | ICD-10-CM | POA: Diagnosis not present

## 2021-06-14 LAB — CBC WITH DIFFERENTIAL/PLATELET
Abs Immature Granulocytes: 0.02 10*3/uL (ref 0.00–0.07)
Basophils Absolute: 0.1 10*3/uL (ref 0.0–0.1)
Basophils Relative: 1 %
Eosinophils Absolute: 0.4 10*3/uL (ref 0.0–0.5)
Eosinophils Relative: 5 %
HCT: 34.9 % — ABNORMAL LOW (ref 39.0–52.0)
Hemoglobin: 11.7 g/dL — ABNORMAL LOW (ref 13.0–17.0)
Immature Granulocytes: 0 %
Lymphocytes Relative: 23 %
Lymphs Abs: 1.7 10*3/uL (ref 0.7–4.0)
MCH: 31.2 pg (ref 26.0–34.0)
MCHC: 33.5 g/dL (ref 30.0–36.0)
MCV: 93.1 fL (ref 80.0–100.0)
Monocytes Absolute: 0.6 10*3/uL (ref 0.1–1.0)
Monocytes Relative: 9 %
Neutro Abs: 4.6 10*3/uL (ref 1.7–7.7)
Neutrophils Relative %: 62 %
Platelets: 152 10*3/uL (ref 150–400)
RBC: 3.75 MIL/uL — ABNORMAL LOW (ref 4.22–5.81)
RDW: 13.6 % (ref 11.5–15.5)
WBC: 7.3 10*3/uL (ref 4.0–10.5)
nRBC: 0 % (ref 0.0–0.2)

## 2021-06-14 LAB — COMPREHENSIVE METABOLIC PANEL
ALT: 17 U/L (ref 0–44)
AST: 28 U/L (ref 15–41)
Albumin: 3.8 g/dL (ref 3.5–5.0)
Alkaline Phosphatase: 77 U/L (ref 38–126)
Anion gap: 8 (ref 5–15)
BUN: 14 mg/dL (ref 8–23)
CO2: 25 mmol/L (ref 22–32)
Calcium: 8.7 mg/dL — ABNORMAL LOW (ref 8.9–10.3)
Chloride: 103 mmol/L (ref 98–111)
Creatinine, Ser: 0.83 mg/dL (ref 0.61–1.24)
GFR, Estimated: >60 mL/min (ref >60)
Glucose, Bld: 173 mg/dL — ABNORMAL HIGH (ref 70–99)
Potassium: 3.7 mmol/L (ref 3.5–5.1)
Sodium: 136 mmol/L (ref 135–145)
Total Bilirubin: 0.8 mg/dL (ref 0.3–1.2)
Total Protein: 7.2 g/dL (ref 6.5–8.1)

## 2021-06-14 LAB — LACTATE DEHYDROGENASE: LDH: 235 U/L — ABNORMAL HIGH (ref 98–192)

## 2021-06-14 NOTE — Assessment & Plan Note (Addendum)
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 [Since December 2018]. FEB 2022--CT scan chest ~1.5 cm; STABLE.  Again reviewed the differential diagnosis of benign etiology/low-grade lymphoma-however given patient's medical comorbidities age I think it is reasonable to continue surveillance without any aggressive work-up at this time.  Given the overall stability disease; reasonable to follow-up imaging in 1 year.  Will order imaging at next visit.  # S/p Fall mechanical-no significant trauma.  # Anemia/Cirhosis- 11.2; ? Etiology [hx Bleeding ulcer]; recommend    # RCC s/p Right ablation-2001.  August 2021-CT scan partial evaluation; hold off imaging at this time/ Dr.Stoioff-   # DISPOSITION: iron studies/ferritin # in  6 months- MD/labs-cbc/cmp/ldh; CT chest prior-- Dr.B  Cc; Dr.Gilbert .

## 2021-06-14 NOTE — Patient Instructions (Signed)
#   recommend PO iron 65 mg [OTC]- once a day; if constipation/ or if abdominal bloating-recommend taking every other day.  Stool could turn black.[name]

## 2021-06-14 NOTE — Progress Notes (Signed)
[place] NOTE  Patient Care Team: [name], MD as PCP - General (Unknown Physician Specialty) Wellington Hampshire, MD as PCP - Cardiology (Cardiology) Margaretha Sheffield, MD (Otolaryngology) Leandrew Koyanagi, MD as Referring Physician (Ophthalmology) Sharlotte Alamo, DPM as Consulting Physician (Podiatry) Bernardo Heater, Ronda Fairly, MD as Consulting Physician (Urology) Jannet Mantis, MD as Consulting Physician (Dermatology) Telford Nab, RN as Registered Nurse [name], MD as Consulting Physician (Gastroenterology) Cockfield, Deanne Coffer, PA-C (Cardiology)  CHIEF COMPLAINTS/PURPOSE OF CONSULTATION:  Mediastinal adenopathy  #  Oncology History Overview Note  #February 2020 gastroenteropathy/right supraclavicular adenopathy-incidental]-PET scan 2020-low-grade FDG activity-differential low-grade lymphoma versus benign reactive process. no biopsy  #Right kidney-cancer status post cryo [[facility]]  # 2020- colonoscopy s/p polypectomy  [Dr.Wohl-]  #CAD status post CABG; mechanical valve replacement [2019 August; [facility]]; on Coumadin     Adenocarcinoma, renal cell ([place])  06/13/2015 Initial Diagnosis   Adenocarcinoma, renal cell ([place])   Cancer of right kidney ([facility])  12/10/2018 Initial Diagnosis   Cancer of right kidney ([place])     HISTORY OF PRESENTING ILLNESS: Accompanied by his wife. Delwyn M Duprey 87 y.o.  male is here for follow-up  re: of mediastinal/neck adenopathy/anemia.  Patient had recent fall mechanical.  No significant trauma.  Continues to have memory issues.  No new shortness of breath or cough.  No weight loss.   Review of Systems  Constitutional:  Positive for malaise/fatigue and weight loss. Negative for chills, diaphoresis and fever.  HENT:  Negative for nosebleeds and sore throat.   Eyes:  Negative for double vision.  Respiratory:  Negative for cough, hemoptysis, sputum production, shortness of breath and wheezing.    Cardiovascular:  Negative for chest pain, palpitations, orthopnea and leg swelling.  Gastrointestinal:  Negative for abdominal pain, blood in stool, constipation, diarrhea, heartburn, melena, nausea and vomiting.  Genitourinary:  Negative for dysuria, frequency and urgency.  Musculoskeletal:  Negative for back pain and joint pain.  Skin: Negative.  Negative for itching and rash.  Neurological:  Positive for dizziness. Negative for tingling, focal weakness, weakness and headaches.  Endo/Heme/Allergies:  Does not bruise/bleed easily.  Psychiatric/Behavioral:  Negative for depression. The patient is not nervous/anxious and does not have insomnia.     MEDICAL HISTORY:  Past Medical History:  Diagnosis Date   Aortic stenosis    a. 01/2016 echo: mod AS; b. 04/2018 Cardiac MRI: sev AS; c. 05/2018 s/p mech AVR @ time of myomectomy; d. 07/2018 Echo: Mild AS/AI.   Atherosclerosis of aorta ([place])    by CT scan in past   Atrial fibrillation ([facility])    and aflutter. pt has a left atrial circuit that is not ablated. was on amiodarone-stopped, now use rate control.    Bladder cancer ([place])    hx; treated with BCG in past    Carotid artery disease ([place])    There was calcified plaque but no stenosis by carotid artery screening done at [facility] October, 2013   CHB (complete heart block) [facility])    a. 05/2018 s/p BSX VVIR PPM ([facility]) in setting of bradycardia following myomectomy and AVR/MVR.   Diabetes mellitus    type not specified   Diabetic neuropathy ([place])    feet   Elevated liver enzymes    over time; hx   Excessive sweating    Glaucoma    Gout    Head injury    when slipped on ice 2004-2005. stabilized and back on Coumadin  Headache    migraines - distant past   History of cardiac cath    a. 05/2018 Cath ([facility]): Non-obstructive CAD. Mildly elevated filling pressures w/ nl CI.   HOCM (hypertrophic obstructive cardiomyopathy) ([place])    a. 01/2016 Echo: EF 65-70%, no rwma, LVOT  gradient of 80-52mHg, mod AS, SAM; b. 11/2017 Echo: EF 55-60%, near cavity obliteration in systole, mod AS, mild MS; c. 04/2018 Card MRI ([facility]): Sev LVH, EF >70%, Sev AS, LVOT obs w/ Sev MR, mild to mod TR/PR, mid-myocardial HE in basal-mid anteroseptum and inferoseptum; d. 05/2018 s/p Septal myomectomy; e. 07/2018 Echo: EF 60-65%. PASP 367mg.   Homocystinemia ([place]   elevated, mild    Hypercholesterolemia    treated.    Hypertension    Infection of right inner ear    Kidney mass    a. s/p laproscopic surgery woth [facility]ablation of [facility]: 2cm R kidney mass.   Mediastinal adenopathy    a. 12/208 CT Chest: up to 65m42mLL lung nodule. 1.5-1.7cm subcarinal/mediastinal adenopahty/right paratracheal lymph node; b. 11/2018 CT Chest: 2.5-3cm mediastinal adenopathy.   Mitral regurgitation    a. 04/2018 Cardiac MRI: sev MR; b. 05/2018 s/p mech MVR @ time of myomectomy; c. 07/2018 Echo: Mild MS.   Motion sickness    ocean boats   Nausea    Nonobstructive coronary artery disease    a. 05/2018 Cath ([facility]): nonobs CAD.   Obstructive sleep apnea    CPAP started successfully 2014   Orthostatic hypotension    a. orthostatic. dehydration. hospitalized 11/11   Sleep apnea    Significant obstructive sleep apnea diagnosed in August, 2012, the patient is to receive CPAP    [facility]  "[facility] strokes" CT and MRI. [place] [facility] 11/11. diagnosis was dehydration, no acutal reports.    TSH elevation    on synthroid historically    Unsteady gait    August, 2012    SURGICAL HISTORY: Past Surgical History:  Procedure Laterality Date   BLADDER SURGERY     CATARACT EXTRACTION W/PHACO Left 05/11/2015   Procedure: CATARACT EXTRACTION PHACO AND INTRAOCULAR LENS PLACEMENT ([place] Surgeon: ChaLeandrew KoyanagiD;  [facility];  Laterality: Left;  DIABETIC - oral meds, CPAP   COLONOSCOPY WITH PROPOFOL N/A 04/14/2019    Procedure: COLONOSCOPY WITH PROPOFOL;  Surgeon: WohLucilla LameD;  [facility];  Service: Endoscopy;  Laterality: N/A;   ESOPHAGOGASTRODUODENOSCOPY (EGD) WITH PROPOFOL N/A 07/01/2018   Procedure: ESOPHAGOGASTRODUODENOSCOPY (EGD) WITH PROPOFOL;  Surgeon: WohLucilla LameD;  Location: [facility] ENDOSCOPY;  Service: Endoscopy;  Laterality: N/A;   INSERT / REPLACE / [place]  "froze mass"   MECHANICAL AORTIC AND MITRAL VALVE REPLACEMENT  05/2018   [facility]    TONSILLECTOMY     VALVE REPLACEMENT      SOCIAL HISTORY: Social History   Socioeconomic History   Marital status: Married    Spouse name: MarJasmine AweNumber of children: 1   Years of education: 16   Highest education level: Bachelor's degree (e.g., BA, AB, BS)  Occupational History   Occupation: retired  Tobacco Use   Smoking status: Former    Types: Cigars, Cigarettes    Quit date: 10/02/1974    Years since quitting: 46.8   Smokeless tobacco: Never   Tobacco comments:  still smokes cigars once a day  Vaping Use   Vaping Use: Never used  Substance and Sexual Activity   Alcohol use: No   Drug use: No   Sexual activity: Yes  Other Topics Concern   Not on file  Social History Narrative   Married, retired, gets regular exercise.    Social Determinants of Health   Financial Resource Strain: Not on file  Food Insecurity: Not on file  Transportation Needs: Not on file  Physical Activity: Not on file  Stress: Not on file  Social Connections: Not on file  Intimate Partner Violence: Not on file    FAMILY HISTORY: Family History  Problem Relation Age of Onset   Arrhythmia Father        A-Fib   Prostate cancer Father    Stroke Father    Breast cancer Mother    Arrhythmia Brother        A-Fib   Heart attack Neg Hx    Hypertension Neg Hx     ALLERGIES:  is allergic to macrolides and ketolides, mycinettes, nitrofuran derivatives, nitrofurantoin, erythromycin,  zofran [ondansetron hcl-dextrose], and zofran [ondansetron hcl].  MEDICATIONS:  Current Outpatient Medications  Medication Sig Dispense Refill   aspirin EC 81 MG tablet Take 1 tablet (81 mg total) by mouth daily.     atorvastatin (LIPITOR) 40 MG tablet Take 1 tablet (40 mg total) by mouth daily. 90 tablet 3   EUTHYROX 50 MCG tablet Take 1 tablet by mouth once daily 90 tablet 0   glucose blood (ONETOUCH ULTRA) test strip USE 1 STRIP TO CHECK GLUCOSE ONCE DAILY 50 each 0   Lancets (ONETOUCH DELICA PLUS 123XX123) MISC USE 1  TO CHECK GLUCOSE ONCE DAILY 100 each 6   latanoprost (XALATAN) 0.005 % ophthalmic solution Place 1 drop into both eyes at bedtime.      meclizine (ANTIVERT) 12.5 MG tablet TAKE 1 TABLET BY MOUTH THREE TIMES DAILY AS NEEDED FOR  DIZZINESS. Please schedule office visit before any future refills. (Patient taking differently: TAKE 1 TABLET BY MOUTH THREE TIMES DAILY AS NEEDED FOR  DIZZINESS. Please schedule office visit before any future refills.) 30 tablet 0   metoprolol tartrate (LOPRESSOR) 25 MG tablet Take 1/2 (one-half) tablet by mouth twice daily 90 tablet 1   ONETOUCH DELICA LANCETS 99991111 MISC USE ONE LANCET TO CHECK BLOOD GLUCOSE ONCE DAILY 100 each 12   ONETOUCH ULTRA test strip USE 1 STRIP TO CHECK GLUCOSE ONCE DAILY 50 each 0   warfarin (COUMADIN) 3 MG tablet Take as directed by the anti-coag clinic 60 tablet 4   alendronate (FOSAMAX) 70 MG tablet Take 1 tablet (70 mg total) by mouth once a week. Take with a full glass of water on an empty stomach. 4 tablet 11   ALPRAZolam (XANAX) 0.25 MG tablet Take 1 tablet (0.25 mg total) by mouth every 6 (six) hours as needed for anxiety. (Patient not taking: No sig reported) 60 tablet 0   metFORMIN (GLUCOPHAGE) 1000 MG tablet Take 1 tablet by mouth once daily with breakfast 90 tablet 3   pantoprazole (PROTONIX) 40 MG tablet Take 1 tablet by mouth twice daily 180 tablet 2   No current facility-administered medications for this visit.       [name]  PHYSICAL EXAMINATION: ECOG PERFORMANCE STATUS: 0 - Asymptomatic  Vitals:   06/14/21 1322  BP: 128/66  Pulse: 69  Resp: 18  Temp: 98.9 F (37.2 C)  SpO2: 98%   Filed Weights  06/14/21 1322  Weight: 177 lb 12.8 oz (80.6 kg)    Physical Exam Constitutional:      Comments: He is walking with a cane.  He is accompanied by his wife/daughter.  HENT:     Head: Normocephalic and atraumatic.     Mouth/Throat:     Pharynx: No oropharyngeal exudate.  Eyes:     Pupils: Pupils are equal, round, and reactive to light.  Cardiovascular:     Rate and Rhythm: Normal rate and regular rhythm.  Pulmonary:     Effort: Pulmonary effort is normal. No respiratory distress.     Breath sounds: Normal breath sounds. No wheezing.  Abdominal:     General: Bowel sounds are normal. There is no distension.     Palpations: Abdomen is soft. There is no mass.     Tenderness: There is no abdominal tenderness. There is no guarding or rebound.  Musculoskeletal:        General: No tenderness. Normal range of motion.     Cervical back: Normal range of motion and neck supple.  Skin:    General: Skin is warm.  Neurological:     Mental Status: He is alert and oriented to person, place, and time.  Psychiatric:        Mood and Affect: Affect normal.     LABORATORY DATA:  I have reviewed the data as listed Lab Results  Component Value Date   WBC 7.3 06/14/2021   HGB 11.7 (L) 06/14/2021   HCT 34.9 (L) 06/14/2021   MCV 93.1 06/14/2021   PLT 152 06/14/2021   Recent Labs    10/25/20 1006 12/05/20 1129 12/07/20 1427 06/14/21 1244  NA 138  --  139 136  K 4.7  --  4.3 3.7  CL 100  --  103 103  CO2 25  --  26 25  GLUCOSE 109*  --  138* 173*  BUN 16  --  21 14  CREATININE 0.99 0.90 0.92 0.83  CALCIUM 9.6  --  9.0 8.7*  GFRNONAA 70  --  >60 >60  GFRAA 81  --   --   --   PROT 7.7  --  7.8 7.2  ALBUMIN 4.5  --  4.2 3.8  AST 26  --  26 28  ALT 15  --  17 17  ALKPHOS 81  --  65 77   BILITOT 0.9  --  0.9 0.8    RADIOGRAPHIC STUDIES: I have personally reviewed the radiological images as listed and agreed with the findings in the report. DG Bone Density  Result Date: 06/29/2021 EXAM: DUAL X-RAY ABSORPTIOMETRY (DXA) FOR BONE MINERAL DENSITY IMPRESSION: Your patient Seyon Purrington completed a BMD test on 06/29/2021 using the [place] (software version: 14.10) manufactured by [employer]. The following summarizes the results of our evaluation. Technologist: Novella Rob PATIENT BIOGRAPHICAL: Name: Barett, Sobrino Patient ID: [id number] Birth Date: 1934-02-23 Height: 69.0 in. Gender: Male Exam Date: 06/29/2021 Weight: 177.0 lbs. Indications: Diabetic, History of Fracture (Adult), Tobacco User (Current Smoker) Fractures: compression fracture, Lumbar Spine, vertebrae Treatments: aspirin, Coumidin, Euthyox, Metformin, Protonix DENSITOMETRY RESULTS: Site         Region     Measured Date Measured Age WHO Classification Young Adult T-score BMD         %Change vs. Previous Significant Change (*) DualFemur Neck Right 06/29/2023 85.5 Osteopenia -1.1 0.924 g/cm2 - - Left Forearm Radius 33% 06/29/2021 85.5 Osteopenia -1.6 0.834 g/cm2 - -  ASSESSMENT: The BMD measured at Forearm Radius 33% is 0.834 g/cm2 with a T-score of -1.6. This patient is considered osteopenic according to [facility]) criteria. Low Body Weight. The scan quality is good. Lumbar spine was not utilized due to advanced degenerative changes. World Pharmacologist [facility]) criteria for post-menopausal, Caucasian Women: Normal:                   T-score at or above -1 SD Osteopenia/low bone mass: T-score between -1 and -2.5 SD Osteoporosis:             T-score at or below -2.5 SD RECOMMENDATIONS: 1. All patients should optimize calcium and vitamin D intake. 2. Consider FDA-approved medical therapies in postmenopausal women and men aged 71 years and older, based on the following: a. A hip or  vertebral(clinical or morphometric) fracture b. T-score < -2.5 at the femoral neck or spine after appropriate evaluation to exclude secondary causes c. Low bone mass (T-score between -1.0 and -2.5 at the femoral neck or spine) and a 10-year probability of a hip fracture > 3% or a 10-year probability of a major osteoporosis-related fracture > 20% based on the [place]-adapted WHO algorithm 3. Clinician judgment and/or patient preferences may indicate treatment for people with 10-year fracture probabilities above or below these levels FOLLOW-UP: People with diagnosed cases of osteoporosis or osteopenia should be regularly tested for bone mineral density. For patients eligible for Medicare, routine testing is allowed once every 2 years. The testing frequency can be increased to one year for patients who have rapidly progressing disease, or for those who are receiving medical therapy to restore bone mass. I have reviewed this report, and agree with the above findings. [facility] Dear Jerrol Banana, Your patient Shelbie Ammons Carthen completed a FRAX assessment on 06/29/2021 using the [place] (analysis version: 14.10) manufactured by [employer]. The following summarizes the results of our evaluation. PATIENT BIOGRAPHICAL: Name: Rece, Chowning Patient ID: [id number] Birth Date: [birth year] Height:    69.0 in. Gender:     [age]      [age]       Weight:    177.0 lbs. Ethnicity:  White                            Exam Date: 06/29/2021 FRAX* RESULTS:  (version: 3.5) 10-year Probability of Fracture1 Major Osteoporotic Fracture2 Hip Fracture 9.7% 4.6% Population: [place] (Caucasian) Risk Factors: History of Fracture (Adult), Tobacco User (Current Smoker) Based on DualFemur (Right) Neck BMD 1 -The 10-year probability of fracture may be lower than reported if the patient has received treatment. 2 -Major Osteoporotic Fracture: Clinical Spine, Forearm, Hip or Shoulder *FRAX is a Materials engineer of the  [employer] for Metabolic Bone Disease, a [employer] (WHO) [facility]. ASSESSMENT: The probability of a major osteoporotic fracture is 9.7% within the next ten years. The probability of a hip fracture is 4.6% within the next ten years. . Electronically Signed   By: Rolm Baptise M.D.   On: 06/29/2021 16:05     ASSESSMENT & PLAN:   Mediastinal lymphadenopathy # Mediastinal Right paratracheal  LN/right supraclavicular adenopathy 1-2 centimeters lymph node. [Since December 2018]. FEB 2022--CT scan chest ~1.5 cm; STABLE.  Again reviewed the differential diagnosis of benign etiology/low-grade lymphoma-however given patient's medical comorbidities age I think it is reasonable  to continue surveillance without any aggressive work-up at this time.  Given the overall stability disease; reasonable to follow-up imaging in 1 year.  Will order imaging at next visit.  # S/p Fall mechanical-no significant trauma.  # Anemia/Cirhosis- 11.2; ? Etiology [hx Bleeding ulcer]; recommend    # RCC s/p Right ablation-2001.  August 2021-CT scan partial evaluation; hold off imaging at this time/ Dr.Stoioff-   # DISPOSITION: iron studies/ferritin # in  6 months- MD/labs-cbc/cmp/ldh; CT chest prior-- Dr.B  Cc; Dr.Gilbert .  All questions were answered. The patient knows to call the clinic with any problems, questions or concerns.    Cammie Sickle, MD 07/12/2021 3:02 PM

## 2021-06-21 ENCOUNTER — Ambulatory Visit (INDEPENDENT_AMBULATORY_CARE_PROVIDER_SITE_OTHER): Payer: PPO

## 2021-06-21 ENCOUNTER — Other Ambulatory Visit: Payer: Self-pay

## 2021-06-21 DIAGNOSIS — Z950 Presence of cardiac pacemaker: Secondary | ICD-10-CM

## 2021-06-21 DIAGNOSIS — I4891 Unspecified atrial fibrillation: Secondary | ICD-10-CM | POA: Diagnosis not present

## 2021-06-21 DIAGNOSIS — I4892 Unspecified atrial flutter: Secondary | ICD-10-CM | POA: Diagnosis not present

## 2021-06-21 DIAGNOSIS — Z952 Presence of prosthetic heart valve: Secondary | ICD-10-CM

## 2021-06-21 DIAGNOSIS — Z5181 Encounter for therapeutic drug level monitoring: Secondary | ICD-10-CM | POA: Diagnosis not present

## 2021-06-21 DIAGNOSIS — I482 Chronic atrial fibrillation, unspecified: Secondary | ICD-10-CM | POA: Diagnosis not present

## 2021-06-21 LAB — POCT INR: INR: 2.9 (ref 2.0–3.0)

## 2021-06-21 NOTE — Patient Instructions (Addendum)
-   continue Warfarin 1.5 tablets every day EXCEPT 1 tablet on [place]. - Recheck INR in 7 weeks.  Let me know if you start on the sertraline and we will move your appt up  a few weeks

## 2021-06-27 ENCOUNTER — Ambulatory Visit (INDEPENDENT_AMBULATORY_CARE_PROVIDER_SITE_OTHER): Payer: PPO | Admitting: Family Medicine

## 2021-06-27 ENCOUNTER — Encounter: Payer: Self-pay | Admitting: Family Medicine

## 2021-06-27 ENCOUNTER — Other Ambulatory Visit: Payer: Self-pay

## 2021-06-27 VITALS — BP 115/69 | HR 69 | Resp 18 | Wt 177.0 lb

## 2021-06-27 DIAGNOSIS — E1142 Type 2 diabetes mellitus with diabetic polyneuropathy: Secondary | ICD-10-CM

## 2021-06-27 DIAGNOSIS — D090 Carcinoma in situ of bladder: Secondary | ICD-10-CM

## 2021-06-27 DIAGNOSIS — H60502 Unspecified acute noninfective otitis externa, left ear: Secondary | ICD-10-CM

## 2021-06-27 DIAGNOSIS — H6122 Impacted cerumen, left ear: Secondary | ICD-10-CM

## 2021-06-27 DIAGNOSIS — I4891 Unspecified atrial fibrillation: Secondary | ICD-10-CM

## 2021-06-27 DIAGNOSIS — S32010G Wedge compression fracture of first lumbar vertebra, subsequent encounter for fracture with delayed healing: Secondary | ICD-10-CM | POA: Diagnosis not present

## 2021-06-27 DIAGNOSIS — S32010A Wedge compression fracture of first lumbar vertebra, initial encounter for closed fracture: Secondary | ICD-10-CM | POA: Diagnosis not present

## 2021-06-27 DIAGNOSIS — G3184 Mild cognitive impairment, so stated: Secondary | ICD-10-CM

## 2021-06-27 DIAGNOSIS — Z9889 Other specified postprocedural states: Secondary | ICD-10-CM | POA: Diagnosis not present

## 2021-06-27 DIAGNOSIS — M4319 Spondylolisthesis, multiple sites in spine: Secondary | ICD-10-CM | POA: Diagnosis not present

## 2021-06-27 DIAGNOSIS — M47816 Spondylosis without myelopathy or radiculopathy, lumbar region: Secondary | ICD-10-CM | POA: Diagnosis not present

## 2021-06-27 NOTE — Progress Notes (Signed)
Established patient visit   Patient: [name] [name]   DOB: 1934/01/18   87 y.o. Male  MRN: [id number] Visit Date: 06/27/2021  Today's healthcare provider: [name], MD   Chief Complaint  Patient presents with   Ear Drainage   Subjective    HPI  Patient has had blood in his left ear. Patient saw blood on his pillow 2 mornings ago. Patient states there is no pain. However, both of his ears itch and he digs in them a lot..  He has hearing aids bilaterally and his wife thinks that he has had a little blood from his left ear canal.  He has not been sick.  He denies any ear pain.    Medications: Outpatient Medications Prior to Visit  Medication Sig   aspirin EC 81 MG tablet Take 1 tablet (81 mg total) by mouth daily.   atorvastatin (LIPITOR) 40 MG tablet Take 1 tablet (40 mg total) by mouth daily.   EUTHYROX 50 MCG tablet Take 1 tablet by mouth once daily   glucose blood (ONETOUCH ULTRA) test strip USE 1 STRIP TO CHECK GLUCOSE ONCE DAILY   Lancets (ONETOUCH DELICA PLUS 123XX123) MISC USE 1  TO CHECK GLUCOSE ONCE DAILY   latanoprost (XALATAN) 0.005 % ophthalmic solution Place 1 drop into both eyes at bedtime.    meclizine (ANTIVERT) 12.5 MG tablet TAKE 1 TABLET BY MOUTH THREE TIMES DAILY AS NEEDED FOR  DIZZINESS. Please schedule office visit before any future refills. (Patient taking differently: TAKE 1 TABLET BY MOUTH THREE TIMES DAILY AS NEEDED FOR  DIZZINESS. Please schedule office visit before any future refills.)   metFORMIN (GLUCOPHAGE) 1000 MG tablet Take 1 tablet (1,000 mg total) by mouth daily with breakfast. Please schedule office visit before any future refills.   metoprolol tartrate (LOPRESSOR) 25 MG tablet Take 1/2 (one-half) tablet by mouth twice daily   ONETOUCH DELICA LANCETS 99991111 MISC USE ONE LANCET TO CHECK BLOOD GLUCOSE ONCE DAILY   ONETOUCH ULTRA test strip USE 1 STRIP TO CHECK GLUCOSE ONCE DAILY   pantoprazole (PROTONIX) 40 MG tablet Take 1 tablet by  mouth twice daily   warfarin (COUMADIN) 3 MG tablet Take as directed by the anti-coag clinic   ALPRAZolam (XANAX) 0.25 MG tablet Take 1 tablet (0.25 mg total) by mouth every 6 (six) hours as needed for anxiety. (Patient not taking: No sig reported)   No facility-administered medications prior to visit.    Review of Systems  Constitutional:  Negative for appetite change, chills and fever.  Respiratory:  Negative for chest tightness, shortness of breath and wheezing.   Cardiovascular:  Negative for chest pain and palpitations.  Gastrointestinal:  Negative for abdominal pain, nausea and vomiting.       Objective    There were no vitals taken for this visit. BP Readings from Last 3 Encounters:  06/27/21 115/69  06/14/21 128/66  06/12/21 127/67   Wt Readings from Last 3 Encounters:  06/27/21 177 lb (80.3 kg)  06/14/21 177 lb 12.8 oz (80.6 kg)  06/12/21 180 lb (81.6 kg)      Physical Exam Vitals reviewed.  Constitutional:      Appearance: He is well-developed.  HENT:     Head: Normocephalic and atraumatic.     Right Ear: Tympanic membrane, ear canal and external ear normal.     Left Ear: External ear normal.     Ears:     Comments: Left canal has a little bit  of dried blood on the inferior portion right at the opening of the canal.  There is a small amount of blood/scab further and I am unable to see the TM due to cerumen.    Nose: Nose normal.     Mouth/Throat:     Mouth: Mucous membranes are moist.  Eyes:     General: No scleral icterus.    Conjunctiva/sclera: Conjunctivae normal.  Neck:     Thyroid: No thyromegaly.     Vascular: No JVD.  Cardiovascular:     Rate and Rhythm: Normal rate and regular rhythm.     Heart sounds: Normal heart sounds.  Pulmonary:     Effort: Pulmonary effort is normal.     Breath sounds: Normal breath sounds.  Abdominal:     Palpations: Abdomen is soft.  Musculoskeletal:        General: No swelling.     Right lower leg: No edema.      Left lower leg: No edema.  Skin:    General: Skin is warm and dry.  Neurological:     Mental Status: He is alert and oriented to person, place, and time. Mental status is at baseline.  Psychiatric:        Mood and Affect: Mood normal.        Behavior: Behavior normal.        Thought Content: Thought content normal.        Judgment: Judgment normal.      No results found for any visits on 06/27/21.  Assessment & Plan     1. Acute otitis externa of left ear, unspecified type Not infected presently.  Will treat with topical Bactroban just keep area clean as it is abraded.  For to ENT if it does not improve. - Ambulatory referral to ENT  2. Excessive cerumen in left ear canal   3. Atrial fibrillation, unspecified type ([place])   4. Diabetic polyneuropathy associated with type 2 diabetes mellitus ([place])   5. Carcinoma in situ of bladder   6. MCI (mild cognitive impairment) [place] next visit.   No follow-ups on file.      I, [name], MD, have reviewed all documentation for this visit. The documentation on 07/02/21 for the exam, diagnosis, procedures, and orders are all accurate and complete.    Nesbit Michon Cranford Mon, MD  [facility] [phone] (phone) [phone] (fax)  [place]

## 2021-06-29 ENCOUNTER — Ambulatory Visit
Admission: RE | Admit: 2021-06-29 | Discharge: 2021-06-29 | Disposition: A | Discharge status: Home / Self Care | Payer: PPO | Source: Ambulatory Visit | Attending: Family Medicine | Admitting: Family Medicine

## 2021-06-29 ENCOUNTER — Other Ambulatory Visit: Payer: Self-pay

## 2021-06-29 DIAGNOSIS — M8008XA Age-related osteoporosis with current pathological fracture, vertebra(e), initial encounter for fracture: Secondary | ICD-10-CM | POA: Diagnosis not present

## 2021-06-29 DIAGNOSIS — M8589 Other specified disorders of bone density and structure, multiple sites: Secondary | ICD-10-CM | POA: Diagnosis not present

## 2021-06-30 DIAGNOSIS — H6122 Impacted cerumen, left ear: Secondary | ICD-10-CM | POA: Diagnosis not present

## 2021-07-04 ENCOUNTER — Ambulatory Visit: Payer: PPO | Admitting: Family Medicine

## 2021-07-07 ENCOUNTER — Other Ambulatory Visit: Payer: Self-pay

## 2021-07-07 DIAGNOSIS — M8008XA Age-related osteoporosis with current pathological fracture, vertebra(e), initial encounter for fracture: Secondary | ICD-10-CM

## 2021-07-07 MED ORDER — ALENDRONATE SODIUM 70 MG PO TABS: 70.0000 mg | ORAL_TABLET | ORAL | 11 refills | Status: DC

## 2021-07-09 ENCOUNTER — Other Ambulatory Visit: Payer: Self-pay | Admitting: Family Medicine

## 2021-07-09 DIAGNOSIS — E119 Type 2 diabetes mellitus without complications: Secondary | ICD-10-CM

## 2021-07-13 ENCOUNTER — Ambulatory Visit (INDEPENDENT_AMBULATORY_CARE_PROVIDER_SITE_OTHER): Payer: PPO

## 2021-07-13 ENCOUNTER — Other Ambulatory Visit: Payer: Self-pay

## 2021-07-13 DIAGNOSIS — Z0000 Encounter for general adult medical examination without abnormal findings: Secondary | ICD-10-CM | POA: Diagnosis not present

## 2021-07-13 NOTE — Progress Notes (Signed)
Subjective:   [name] [name] is a 87 y.o. male who presents for Medicare Annual/Subsequent preventive examination. I connected with  Kohen M Marcon on 07/13/21 by a video enabled telemedicine application and verified that I am speaking with the correct person using two identifiers.   I discussed the limitations of evaluation and management by telemedicine. The patient expressed understanding and agreed to proceed.   Location of patient: Home Location of provider:  Office  Review of Systems    Defer to PCP       Objective:    There were no vitals filed for this visit. There is no height or weight on file to calculate BMI.  Advanced Directives 07/13/2021 04/24/2021 01/20/2020 06/10/2019 01/19/2019 12/01/2018 09/22/2018  Does Patient Have a Medical Advance Directive? Yes No Yes No Yes Yes Yes  Type of Paramedic of [place];Living will - [place];Living will - [place];Living will [place];Living will [place]  Does patient want to make changes to medical advance directive? No - Patient declined - - - - No - Patient declined No - Patient declined  Copy of [place] in Chart? Yes - validated most recent copy scanned in chart (See row information) - Yes - validated most recent copy scanned in chart (See row information) - Yes - validated most recent copy scanned in chart (See row information) Yes - validated most recent copy scanned in chart (See row information) No - copy requested  Would patient like information on creating a medical advance directive? - - - - - - -    Current Medications (verified) Outpatient Encounter Medications as of 07/13/2021  Medication Sig   sertraline (ZOLOFT) 25 MG tablet Take 1 tablet by mouth every morning.   alendronate (FOSAMAX) 70 MG tablet Take 1 tablet (70 mg total) by mouth once a week. Take with a full glass of water on an empty  stomach.   ALPRAZolam (XANAX) 0.25 MG tablet Take 1 tablet (0.25 mg total) by mouth every 6 (six) hours as needed for anxiety. (Patient not taking: No sig reported)   aspirin EC 81 MG tablet Take 1 tablet (81 mg total) by mouth daily.   atorvastatin (LIPITOR) 40 MG tablet Take 1 tablet (40 mg total) by mouth daily.   EUTHYROX 50 MCG tablet Take 1 tablet by mouth once daily   glucose blood (ONETOUCH ULTRA) test strip USE 1 STRIP TO CHECK GLUCOSE ONCE DAILY   Lancets (ONETOUCH DELICA PLUS [id number]) MISC USE 1  TO CHECK GLUCOSE ONCE DAILY   latanoprost (XALATAN) 0.005 % ophthalmic solution Place 1 drop into both eyes at bedtime.    meclizine (ANTIVERT) 12.5 MG tablet TAKE 1 TABLET BY MOUTH THREE TIMES DAILY AS NEEDED FOR  DIZZINESS. Please schedule office visit before any future refills. (Patient taking differently: TAKE 1 TABLET BY MOUTH THREE TIMES DAILY AS NEEDED FOR  DIZZINESS. Please schedule office visit before any future refills.)   metFORMIN (GLUCOPHAGE) 1000 MG tablet Take 1 tablet by mouth once daily with breakfast   metoprolol tartrate (LOPRESSOR) 25 MG tablet Take 1/2 (one-half) tablet by mouth twice daily   ONETOUCH DELICA LANCETS 52D MISC USE ONE LANCET TO CHECK BLOOD GLUCOSE ONCE DAILY   ONETOUCH ULTRA test strip USE 1 STRIP TO CHECK GLUCOSE ONCE DAILY   pantoprazole (PROTONIX) 40 MG tablet Take 1 tablet by mouth twice daily   warfarin (COUMADIN) 3 MG tablet Take as  directed by the anti-coag clinic   No facility-administered encounter medications on file as of 07/13/2021.    Allergies (verified) Macrolides and ketolides, Mycinettes, Nitrofuran derivatives, Nitrofurantoin, Erythromycin, Zofran [ondansetron hcl-dextrose], and Zofran [ondansetron hcl]   History: Past Medical History:  Diagnosis Date   Aortic stenosis    a. 01/2016 echo: mod AS; b. 04/2018 Cardiac MRI: sev AS; c. 05/2018 s/p mech AVR @ time of myomectomy; d. 07/2018 Echo: Mild AS/AI.   Atherosclerosis of aorta ([place])     by CT scan in past   Atrial fibrillation (HCC)    and aflutter. pt has a left atrial circuit that is not ablated. was on amiodarone-stopped, now use rate control.    Bladder cancer ([place])    hx; treated with BCG in past    Carotid artery disease ([place])    There was calcified plaque but no stenosis by carotid artery screening done at [facility] October, 2013   CHB (complete heart block) [facility])    a. 05/2018 s/p BSX VVIR PPM ([facility]) in setting of bradycardia following myomectomy and AVR/MVR.   Diabetes mellitus    type not specified   Diabetic neuropathy ([place])    feet   Elevated liver enzymes    over time; hx   Excessive sweating    Glaucoma    Gout    Head injury    when slipped on ice 2004-2005. stabilized and back on Coumadin    Headache    migraines - distant past   History of cardiac cath    a. 05/2018 Cath ([facility]): Non-obstructive CAD. Mildly elevated filling pressures w/ nl CI.   HOCM (hypertrophic obstructive cardiomyopathy) ([place])    a. 01/2016 Echo: EF 65-70%, no rwma, LVOT gradient of 80-76mmHg, mod AS, SAM; b. 11/2017 Echo: EF 55-60%, near cavity obliteration in systole, mod AS, mild MS; c. 04/2018 Card MRI ([facility]): Sev LVH, EF >70%, Sev AS, LVOT obs w/ Sev MR, mild to mod TR/PR, mid-myocardial HE in basal-mid anteroseptum and inferoseptum; d. 05/2018 s/p Septal myomectomy; e. 07/2018 Echo: EF 60-65%. PASP 58mmHg.   Homocystinemia ([place])    elevated, mild    Hypercholesterolemia    treated.    Hypertension    Infection of right inner ear    Kidney mass    a. s/p laproscopic surgery woth cryoablation of a mass outside kidney followed at [facility]; b. 11/2018 CT Chest: 2cm R kidney mass.   Mediastinal adenopathy    a. 12/208 CT Chest: up to 66mm LLL lung nodule. 1.5-1.7cm subcarinal/mediastinal adenopahty/right paratracheal lymph node; b. 11/2018 CT Chest: 2.5-3cm mediastinal adenopathy.   Mitral regurgitation    a. 04/2018 Cardiac MRI: sev MR; b. 05/2018 s/p mech MVR @ time  of myomectomy; c. 07/2018 Echo: Mild MS.   Motion sickness    ocean boats   Nausea    Nonobstructive coronary artery disease    a. 05/2018 Cath ([facility]): nonobs CAD.   Obstructive sleep apnea    CPAP started successfully 2014   Orthostatic hypotension    a. orthostatic. dehydration. hospitalized 11/11   Sleep apnea    Significant obstructive sleep apnea diagnosed in August, 2012, the patient is to receive CPAP    Stroke [facility])    "2 old strokes" CT and MRI. [facility] [facility] 11/11. diagnosis was dehydration, no acutal reports.    TSH elevation    on synthroid historically    Unsteady gait    August, 2012   Past Surgical History:  Procedure Laterality Date  BLADDER SURGERY     CATARACT EXTRACTION W/PHACO Left 05/11/2015   Procedure: CATARACT EXTRACTION PHACO AND INTRAOCULAR LENS PLACEMENT ([place]);  Surgeon: Leandrew Koyanagi, MD;  Location: [place] [facility];  Service: Ophthalmology;  Laterality: Left;  DIABETIC - oral meds, CPAP   COLONOSCOPY WITH PROPOFOL N/A 04/14/2019   Procedure: COLONOSCOPY WITH PROPOFOL;  Surgeon: [name], MD;  Location[facility] ENDOSCOPY;  Service: Endoscopy;  Laterality: N/A;   ESOPHAGOGASTRODUODENOSCOPY (EGD) WITH PROPOFOL N/A 07/01/2018   Procedure: ESOPHAGOGASTRODUODENOSCOPY (EGD) WITH PROPOFOL;  Surgeon: [name], MD;  Location: [facility] ENDOSCOPY;  Service: Endoscopy;  Laterality: N/A;   INSERT / REPLACE / [place]     "froze mass"   MECHANICAL AORTIC AND MITRAL VALVE REPLACEMENT  05/2018   [facility]    TONSILLECTOMY     VALVE REPLACEMENT     Family History  Problem Relation Age of Onset   Arrhythmia Father        A-Fib   Prostate cancer Father    Stroke Father    Breast cancer Mother    Arrhythmia Brother        A-Fib   Heart attack Neg Hx    Hypertension Neg Hx    Social History   Socioeconomic History   Marital status: Married    Spouse name: Jasmine Awe   Number of children: 1   Years of  education: 16   Highest education level: Bachelor's degree (e.g., BA, AB, BS)  Occupational History   Occupation: retired  Tobacco Use   Smoking status: Former    Types: Cigars, Cigarettes    Quit date: 10/02/1974    Years since quitting: 46.8   Smokeless tobacco: Never   Tobacco comments:    still smokes cigars once a day  Vaping Use   Vaping Use: Never used  Substance and Sexual Activity   Alcohol use: No   Drug use: No   Sexual activity: Yes  Other Topics Concern   Not on file  Social History Narrative   Married, retired, gets regular exercise.    Social Determinants of Health   Financial Resource Strain: Low Risk    Difficulty of Paying Living Expenses: Not hard at all  Food Insecurity: No Food Insecurity   Worried About Charity fundraiser in the Last Year: Never true   [place] in the Last Year: Never true  Transportation Needs: No Transportation Needs   Lack of Transportation (Medical): No   Lack of Transportation (Non-Medical): No  Physical Activity: Insufficiently Active   Days of Exercise per Week: 4 days   Minutes of Exercise per Session: 10 min  Stress: No Stress Concern Present   Feeling of Stress : Only a little  Social Connections: Engineer, building services of Communication with Friends and Family: Twice a week   Frequency of Social Gatherings with Friends and Family: Once a week   Attends Religious Services: More than 4 times per year   Active Member of [employer] or Organizations: Yes   Attends Music therapist: More than 4 times per year   Marital Status: Married    Tobacco Counseling Counseling given: Not Answered Tobacco comments: still smokes cigars once a day   Clinical Intake:  Pre-visit preparation completed: Yes  Pain : No/denies pain     Diabetes: Yes CBG done?: No Did pt. bring in CBG monitor from home?: No  How often do you need  to have someone help you when you read instructions, pamphlets, or other  written materials from your doctor or pharmacy?: 1 - Never  Diabetic Yes  Interpreter Needed?: No      Activities of Daily Living In your present state of health, do you have any difficulty performing the following activities: 06/12/2021  Hearing? Y  Vision? N  Difficulty concentrating or making decisions? N  Walking or climbing stairs? Y  Dressing or bathing? Y  Doing errands, shopping? N  Some recent data might be hidden    Patient Care Team: [name], MD as PCP - General (Unknown Physician Specialty) Wellington Hampshire, MD as PCP - Cardiology (Cardiology) Margaretha Sheffield, MD (Otolaryngology) Leandrew Koyanagi, MD as Referring Physician (Ophthalmology) Sharlotte Alamo, DPM as Consulting Physician (Podiatry) Bernardo Heater, Ronda Fairly, MD as Consulting Physician (Urology) Jannet Mantis, MD as Consulting Physician (Dermatology) Telford Nab, RN as Registered Nurse [name], MD as Consulting Physician (Gastroenterology) Farrel Demark Deanne Coffer, PA-C (Cardiology)  Indicate any recent Medical Services you may have received from other than [facility] providers in the past year (date may be approximate).     Assessment:   This is a routine wellness examination for [place].  Hearing/Vision screen No results found.  Dietary issues and exercise activities discussed:     Goals Addressed   None   Depression Screen PHQ 2/9 Scores 07/13/2021 06/12/2021 06/15/2020 04/01/2019 01/19/2019 09/22/2018 07/08/2018  PHQ - 2 Score 0 0 0 0 0 0 0  PHQ- 9 Score 1 1 0 3 - 1 -    Fall Risk Fall Risk  07/13/2021 06/12/2021 01/20/2020 01/19/2019 09/22/2018  Falls in the past year? 1 1 0 0 0  Number falls in past yr: 0 0 0 - -  Comment - fracture - - -  Injury with Fall? 1 1 0 - -  Comment - - - - -  Risk Factor Category  - - - - -  Risk for fall due to : Impaired balance/gait History of fall(s);Impaired mobility - - -  Follow up - Falls evaluation completed - - -    FALL RISK PREVENTION  PERTAINING TO THE HOME:  Any stairs in or around the home? Yes  If so, are there any without handrails? No  Home free of loose throw rugs in walkways, pet beds, electrical cords, etc? Yes  Adequate lighting in your home to reduce risk of falls? Yes   ASSISTIVE DEVICES UTILIZED TO PREVENT FALLS:  Life alert? No  Use of a cane, walker or w/c? Yes  Grab bars in the bathroom? No  Shower chair or bench in shower? Yes  Elevated toilet seat or a handicapped toilet? Yes   TIMED UP AND GO:  Was the test performed?  n/a .  Length of time to ambulate 10 feet: n/a sec.     Cognitive Function: MMSE - Mini Mental State Exam 10/24/2020 06/15/2020 04/01/2019 11/11/2017  Orientation to time 5 5 5 4   Orientation to Place 5 5 5 5   Registration 3 3 3 3   Attention/ Calculation 5 5 5 5   Recall 2 3 3  0  Language- name 2 objects 2 2 2 2   Language- repeat 1 1 1 1   Language- follow 3 step command 3 3 2 3   Language- read & follow direction 1 1 1 1   Write a sentence 1 1 1 1   Copy design 1 1 1 1   Total score 29 30 29 26      6CIT  Screen 07/13/2021 01/20/2020 01/19/2019 11/11/2017 10/30/2016  What Year? 0 points 0 points 0 points 0 points 0 points  What month? 0 points 0 points 0 points 0 points 0 points  What time? 0 points 0 points 0 points 0 points 0 points  Count back from 20 0 points 0 points 0 points 0 points 0 points  Months in reverse 0 points 0 points 0 points 2 points 0 points  Repeat phrase 0 points 0 points 0 points 2 points 0 points  Total Score 0 0 0 4 0    Immunizations Immunization History  Administered Date(s) Administered   Fluad Quad(high Dose 65+) 06/12/2019, 06/30/2020   Influenza, High Dose Seasonal PF 08/02/2016, 07/17/2017, 07/05/2018   Influenza-Unspecified 08/15/2013   PFIZER(Purple Top)SARS-COV-2 Vaccination 11/30/2019, 12/29/2019, 07/15/2020   Pneumococcal Conjugate-13 04/08/2014   Pneumococcal Polysaccharide-23 08/07/2003   Td 11/23/2009   Tdap 03/15/2012   Zoster  Recombinat (Shingrix) 05/06/2019, 07/30/2019   Zoster, Live 12/27/2008    TDAP status: Up to date  Flu Vaccine status: Due, Education has been provided regarding the importance of this vaccine. Advised may receive this vaccine at local pharmacy or Health Dept. Aware to provide a copy of the vaccination record if obtained from local pharmacy or Health Dept. Verbalized acceptance and understanding.  Pneumococcal vaccine status: Up to date  Covid-19 vaccine status: Information provided on how to obtain vaccines.   Qualifies for Shingles Vaccine? Yes   Zostavax completed Yes   Shingrix Completed?: Yes  Screening Tests Health Maintenance  Topic Date Due   URINE MICROALBUMIN  10/30/2017   FOOT EXAM  11/13/2019   COVID-19 Vaccine (4 - Booster for Pfizer series) 10/07/2020   INFLUENZA VACCINE  05/15/2021   HEMOGLOBIN A1C  12/12/2021   OPHTHALMOLOGY EXAM  01/10/2022   TETANUS/TDAP  03/15/2022   Zoster Vaccines- Shingrix  Completed   HPV VACCINES  Aged Out    Health Maintenance  Health Maintenance Due  Topic Date Due   URINE MICROALBUMIN  10/30/2017   FOOT EXAM  11/13/2019   COVID-19 Vaccine (4 - Booster for Pfizer series) 10/07/2020   INFLUENZA VACCINE  05/15/2021    Colorectal cancer screening: Type of screening: Colonoscopy. Completed 04/14/2019. Repeat every 5 years  Lung Cancer Screening: (Low Dose CT Chest recommended if Age 22-80 years, 30 pack-year currently smoking OR have quit w/in 15years.) does not qualify.   Lung Cancer Screening Referral: n/a  Additional Screening:  Hepatitis C Screening: does not qualify; Completed n/a  Vision Screening: Recommended annual ophthalmology exams for early detection of glaucoma and other disorders of the eye. Is the patient up to date with their annual eye exam?  Yes  Who is the provider or what is the name of the office in which the patient attends annual eye exams? [facility] If pt is not established with a provider,  would they like to be referred to a provider to establish care?  Established with provider .   Dental Screening: Recommended annual dental exams for proper oral hygiene  Community Resource Referral / Chronic Care Management: CRR required this visit?  No   CCM required this visit?  No      Plan:     I have personally reviewed and noted the following in the patient's chart:   Medical and social history Use of alcohol, tobacco or illicit drugs  Current medications and supplements including opioid prescriptions. Patient is not currently taking opioid prescriptions. Functional ability and status Nutritional status Physical activity  Advanced directives List of other physicians Hospitalizations, surgeries, and ER visits in previous 12 months Vitals Screenings to include cognitive, depression, and falls Referrals and appointments  In addition, I have reviewed and discussed with patient certain preventive protocols, quality metrics, and best practice recommendations. A written personalized care plan for preventive services as well as general preventive health recommendations were provided to patient.     Nat Christen, CMA   07/13/2021   Nurse Notes: Non face to face 40 minute visit.   Mr. Aprea , Thank you for taking time to come for your Medicare Wellness Visit. I appreciate your ongoing commitment to your health goals. Please review the following plan we discussed and let me know if I can assist you in the future.   These are the goals we discussed:  Goals      DIET - INCREASE WATER INTAKE     Recommend increasing water intake to 6-8 glasses a day and cutting back on diet sodas.         This is a list of the screening recommended for you and due dates:  Health Maintenance  Topic Date Due   Urine Protein Check  10/30/2017   Complete foot exam   11/13/2019   COVID-19 Vaccine (4 - Booster for Pfizer series) 10/07/2020   Flu Shot  05/15/2021   Hemoglobin A1C   12/12/2021   Eye exam for diabetics  01/10/2022   Tetanus Vaccine  03/15/2022   Zoster (Shingles) Vaccine  Completed   HPV Vaccine  Aged Out

## 2021-07-13 NOTE — Patient Instructions (Signed)
Health Maintenance, Male Adopting a healthy lifestyle and getting preventive care are important in promoting health and wellness. Ask your health care provider about: The right schedule for you to have regular tests and exams. Things you can do on your own to prevent diseases and keep yourself healthy. What should I know about diet, weight, and exercise? Eat a healthy diet  Eat a diet that includes plenty of vegetables, fruits, low-fat dairy products, and lean protein. Do not eat a lot of foods that are high in solid fats, added sugars, or sodium. Maintain a healthy weight Body mass index (BMI) is a measurement that can be used to identify possible weight problems. It estimates body fat based on height and weight. Your health care provider can help determine your BMI and help you achieve or maintain a healthy weight. Get regular exercise Get regular exercise. This is one of the most important things you can do for your health. Most adults should: Exercise for at least 150 minutes each week. The exercise should increase your heart rate and make you sweat (moderate-intensity exercise). Do strengthening exercises at least twice a week. This is in addition to the moderate-intensity exercise. Spend less time sitting. Even light physical activity can be beneficial. Watch cholesterol and blood lipids Have your blood tested for lipids and cholesterol at 85 years of age, then have this test every 5 years. You may need to have your cholesterol levels checked more often if: Your lipid or cholesterol levels are high. You are older than 85 years of age. You are at high risk for heart disease. What should I know about cancer screening? Many types of cancers can be detected early and may often be prevented. Depending on your health history and family history, you may need to have cancer screening at various ages. This may include screening for: Colorectal cancer. Prostate cancer. Skin cancer. Lung  cancer. What should I know about heart disease, diabetes, and high blood pressure? Blood pressure and heart disease High blood pressure causes heart disease and increases the risk of stroke. This is more likely to develop in people who have high blood pressure readings, are of African descent, or are overweight. Talk with your health care provider about your target blood pressure readings. Have your blood pressure checked: Every 3-5 years if you are 18-39 years of age. Every year if you are 40 years old or older. If you are between the ages of 65 and 75 and are a current or former smoker, ask your health care provider if you should have a one-time screening for abdominal aortic aneurysm (AAA). Diabetes Have regular diabetes screenings. This checks your fasting blood sugar level. Have the screening done: Once every three years after age 45 if you are at a normal weight and have a low risk for diabetes. More often and at a younger age if you are overweight or have a high risk for diabetes. What should I know about preventing infection? Hepatitis B If you have a higher risk for hepatitis B, you should be screened for this virus. Talk with your health care provider to find out if you are at risk for hepatitis B infection. Hepatitis C Blood testing is recommended for: Everyone born from 1945 through 1965. Anyone with known risk factors for hepatitis C. Sexually transmitted infections (STIs) You should be screened each year for STIs, including gonorrhea and chlamydia, if: You are sexually active and are younger than 85 years of age. You are older than 85 years   of age and your health care provider tells you that you are at risk for this type of infection. Your sexual activity has changed since you were last screened, and you are at increased risk for chlamydia or gonorrhea. Ask your health care provider if you are at risk. Ask your health care provider about whether you are at high risk for HIV.  Your health care provider may recommend a prescription medicine to help prevent HIV infection. If you choose to take medicine to prevent HIV, you should first get tested for HIV. You should then be tested every 3 months for as long as you are taking the medicine. Follow these instructions at home: Lifestyle Do not use any products that contain nicotine or tobacco, such as cigarettes, e-cigarettes, and chewing tobacco. If you need help quitting, ask your health care provider. Do not use street drugs. Do not share needles. Ask your health care provider for help if you need support or information about quitting drugs. Alcohol use Do not drink alcohol if your health care provider tells you not to drink. If you drink alcohol: Limit how much you have to 0-2 drinks a day. Be aware of how much alcohol is in your drink. In the [place] one drink equals one 12 oz bottle of beer (355 mL), one 5 oz glass of wine (148 mL), or one 1 oz glass of hard liquor (44 mL). General instructions Schedule regular health, dental, and eye exams. Stay current with your vaccines. Tell your health care provider if: You often feel depressed. You have ever been abused or do not feel safe at home. Summary Adopting a healthy lifestyle and getting preventive care are important in promoting health and wellness. Follow your health care provider's instructions about healthy diet, exercising, and getting tested or screened for diseases. Follow your health care provider's instructions on monitoring your cholesterol and blood pressure. This information is not intended to replace advice given to you by your health care provider. Make sure you discuss any questions you have with your health care provider. Document Revised: 12/09/2020 Document Reviewed: 09/24/2018 Elsevier Patient Education  2022 Elsevier Inc.  

## 2021-07-18 ENCOUNTER — Other Ambulatory Visit: Payer: Self-pay | Admitting: Cardiovascular Disease

## 2021-07-25 ENCOUNTER — Other Ambulatory Visit: Payer: Self-pay | Admitting: Family Medicine

## 2021-07-25 ENCOUNTER — Ambulatory Visit (INDEPENDENT_AMBULATORY_CARE_PROVIDER_SITE_OTHER): Payer: PPO | Admitting: Family Medicine

## 2021-07-25 DIAGNOSIS — E039 Hypothyroidism, unspecified: Secondary | ICD-10-CM

## 2021-07-25 DIAGNOSIS — Z23 Encounter for immunization: Secondary | ICD-10-CM | POA: Diagnosis not present

## 2021-07-26 ENCOUNTER — Ambulatory Visit (INDEPENDENT_AMBULATORY_CARE_PROVIDER_SITE_OTHER): Payer: PPO

## 2021-07-26 ENCOUNTER — Other Ambulatory Visit: Payer: Self-pay

## 2021-07-26 DIAGNOSIS — Z952 Presence of prosthetic heart valve: Secondary | ICD-10-CM | POA: Diagnosis not present

## 2021-07-26 DIAGNOSIS — Z5181 Encounter for therapeutic drug level monitoring: Secondary | ICD-10-CM | POA: Diagnosis not present

## 2021-07-26 DIAGNOSIS — I482 Chronic atrial fibrillation, unspecified: Secondary | ICD-10-CM

## 2021-07-26 DIAGNOSIS — Z950 Presence of cardiac pacemaker: Secondary | ICD-10-CM

## 2021-07-26 DIAGNOSIS — I4891 Unspecified atrial fibrillation: Secondary | ICD-10-CM

## 2021-07-26 DIAGNOSIS — I4892 Unspecified atrial flutter: Secondary | ICD-10-CM

## 2021-07-26 LAB — POCT INR: INR: 2.7 (ref 2.0–3.0)

## 2021-07-26 NOTE — Patient Instructions (Addendum)
-   continue Warfarin 1.5 tablets every day EXCEPT 1 tablet on [place]. - Recheck INR in 5 weeks.

## 2021-07-28 ENCOUNTER — Encounter: Payer: Self-pay | Admitting: Family Medicine

## 2021-07-28 NOTE — Progress Notes (Signed)
Patient here for flu vaccination only.  I did not examine the patient.  I did review his medical history, medications, and allergies and vaccine consent form.  CMA gave vaccination. Patient tolerated well.  Virginia Crews, MD, MPH [facility] 07/28/2021 8:19 AM

## 2021-08-07 DIAGNOSIS — E119 Type 2 diabetes mellitus without complications: Secondary | ICD-10-CM | POA: Diagnosis not present

## 2021-08-07 DIAGNOSIS — B351 Tinea unguium: Secondary | ICD-10-CM | POA: Diagnosis not present

## 2021-08-08 ENCOUNTER — Other Ambulatory Visit: Payer: Self-pay | Admitting: Family Medicine

## 2021-08-08 DIAGNOSIS — Z950 Presence of cardiac pacemaker: Secondary | ICD-10-CM | POA: Diagnosis not present

## 2021-08-08 DIAGNOSIS — R42 Dizziness and giddiness: Secondary | ICD-10-CM

## 2021-08-09 ENCOUNTER — Ambulatory Visit: Payer: Self-pay | Admitting: *Deleted

## 2021-08-09 ENCOUNTER — Other Ambulatory Visit: Payer: Self-pay

## 2021-08-09 ENCOUNTER — Emergency Department: Payer: PPO

## 2021-08-09 ENCOUNTER — Encounter: Payer: Self-pay | Admitting: Emergency Medicine

## 2021-08-09 DIAGNOSIS — E119 Type 2 diabetes mellitus without complications: Secondary | ICD-10-CM | POA: Diagnosis not present

## 2021-08-09 DIAGNOSIS — Z87891 Personal history of nicotine dependence: Secondary | ICD-10-CM | POA: Insufficient documentation

## 2021-08-09 DIAGNOSIS — R791 Abnormal coagulation profile: Secondary | ICD-10-CM | POA: Insufficient documentation

## 2021-08-09 DIAGNOSIS — W228XXA Striking against or struck by other objects, initial encounter: Secondary | ICD-10-CM | POA: Insufficient documentation

## 2021-08-09 DIAGNOSIS — Z7984 Long term (current) use of oral hypoglycemic drugs: Secondary | ICD-10-CM | POA: Diagnosis not present

## 2021-08-09 DIAGNOSIS — I10 Essential (primary) hypertension: Secondary | ICD-10-CM | POA: Insufficient documentation

## 2021-08-09 DIAGNOSIS — Z7901 Long term (current) use of anticoagulants: Secondary | ICD-10-CM | POA: Insufficient documentation

## 2021-08-09 DIAGNOSIS — Z7982 Long term (current) use of aspirin: Secondary | ICD-10-CM | POA: Diagnosis not present

## 2021-08-09 DIAGNOSIS — Z8551 Personal history of malignant neoplasm of bladder: Secondary | ICD-10-CM | POA: Diagnosis not present

## 2021-08-09 DIAGNOSIS — I2510 Atherosclerotic heart disease of native coronary artery without angina pectoris: Secondary | ICD-10-CM | POA: Insufficient documentation

## 2021-08-09 DIAGNOSIS — Z79899 Other long term (current) drug therapy: Secondary | ICD-10-CM | POA: Insufficient documentation

## 2021-08-09 DIAGNOSIS — S0990XA Unspecified injury of head, initial encounter: Secondary | ICD-10-CM | POA: Diagnosis not present

## 2021-08-09 DIAGNOSIS — Z85528 Personal history of other malignant neoplasm of kidney: Secondary | ICD-10-CM | POA: Insufficient documentation

## 2021-08-09 LAB — URINALYSIS, ROUTINE W REFLEX MICROSCOPIC
Bilirubin Urine: NEGATIVE
Glucose, UA: NEGATIVE mg/dL
Hgb urine dipstick: NEGATIVE
Ketones, ur: NEGATIVE mg/dL
Leukocytes,Ua: NEGATIVE
Nitrite: NEGATIVE
Protein, ur: NEGATIVE mg/dL
Specific Gravity, Urine: 1.012 (ref 1.005–1.030)
pH: 5 (ref 5.0–8.0)

## 2021-08-09 LAB — BASIC METABOLIC PANEL
Anion gap: 7 (ref 5–15)
BUN: 14 mg/dL (ref 8–23)
CO2: 27 mmol/L (ref 22–32)
Calcium: 8.8 mg/dL — ABNORMAL LOW (ref 8.9–10.3)
Chloride: 104 mmol/L (ref 98–111)
Creatinine, Ser: 0.84 mg/dL (ref 0.61–1.24)
GFR, Estimated: >60 mL/min (ref >60)
Glucose, Bld: 98 mg/dL (ref 70–99)
Potassium: 4.3 mmol/L (ref 3.5–5.1)
Sodium: 138 mmol/L (ref 135–145)

## 2021-08-09 LAB — CBC
HCT: 35.2 % — ABNORMAL LOW (ref 39.0–52.0)
Hemoglobin: 12.1 g/dL — ABNORMAL LOW (ref 13.0–17.0)
MCH: 32.4 pg (ref 26.0–34.0)
MCHC: 34.4 g/dL (ref 30.0–36.0)
MCV: 94.4 fL (ref 80.0–100.0)
Platelets: 180 10*3/uL (ref 150–400)
RBC: 3.73 MIL/uL — ABNORMAL LOW (ref 4.22–5.81)
RDW: 13.7 % (ref 11.5–15.5)
WBC: 7 10*3/uL (ref 4.0–10.5)
nRBC: 0 % (ref 0.0–0.2)

## 2021-08-09 LAB — PROTIME-INR
INR: 2.3 — ABNORMAL HIGH (ref 0.8–1.2)
Prothrombin Time: 25.6 seconds — ABNORMAL HIGH (ref 11.4–15.2)

## 2021-08-09 LAB — TROPONIN I (HIGH SENSITIVITY)
Troponin I (High Sensitivity): 13 ng/L (ref <18)
Troponin I (High Sensitivity): 14 ng/L (ref <18)

## 2021-08-09 LAB — CBG MONITORING, ED: Glucose-Capillary: 105 mg/dL — ABNORMAL HIGH (ref 70–99)

## 2021-08-09 NOTE — Telephone Encounter (Signed)
Pt's wife calling, pt present. States pt with dizziness, onset this afternoon. Spoke to pt, denies spinning sensation, states "I can't describe how I feel, off balance." States has had dizziness before, inner ear issues "But this feels different."  States off balance even when sitting. Ambulates with cane, but"More unsteady than usual." Wife reports yesterday when shutting car trunk it  hit pt "Hard" on the head. Small lump and small laceration, no LOC. Pt on coumadin. Wife states they are in parking lot of practice. Advised ED for thorough eval.   Verbalizes understanding, states will follow disposition.Assured pt NT would route to practice for PCPs review.      Reason for Disposition  Patient sounds very sick or weak to the triager  Answer Assessment - Initial Assessment Questions 1. DESCRIPTION: "Describe your dizziness."     Lightheadedness 2. LIGHTHEADED: "Do you feel lightheaded?" (e.g., somewhat faint, woozy, weak upon standing)     yes 3. VERTIGO: "Do you feel like either you or the room is spinning or tilting?" (i.e. vertigo)     no 4. SEVERITY: "How bad is it?"  "Do you feel like you are going to faint?" "Can you stand and walk?"   - MILD: Feels slightly dizzy, but walking normally.   - MODERATE: Feels unsteady when walking, but not falling; interferes with normal activities (e.g., school, work).   - SEVERE: Unable to walk without falling, or requires assistance to walk without falling; feels like passing out now.      Little ore off balance than normal. 5. ONSET:  "When did the dizziness begin?"     today 6. AGGRAVATING FACTORS: "Does anything make it worse?" (e.g., standing, change in head position)     no 7. HEART RATE: "Can you tell me your heart rate?" "How many beats in 15 seconds?"  (Note: not all patients can do this)       82 8. CAUSE: "What do you think is causing the dizziness?"     "Bump on head yesterday." 9. RECURRENT SYMPTOM: "Have you had dizziness before?" If  Yes, ask: "When was the last time?" "What happened that time?"     no 10. OTHER SYMPTOMS: "Do you have any other symptoms?" (e.g., fever, chest pain, vomiting, diarrhea, bleeding)       No  Protocols used: Dizziness - Lightheadedness-A-AH

## 2021-08-09 NOTE — ED Triage Notes (Signed)
Pt arrived via POV with reports of having a full head, pt states yesterday the car trunk hit him in the head, no LOC, pt has hx of subdural hematoma 2002. Pt also reports to feeling off balance as well.  Pt is on coumadin. Pt states yesterday he did not do as much as usual and states today while he was at the bank it was more difficult to get around.  Pt reports since sitting still he is feeling better.

## 2021-08-09 NOTE — ED Provider Notes (Signed)
Emergency Medicine Provider Triage Evaluation Note  [name] [name] , a 87 y.o. male  was evaluated in triage.  Pt complains of dizziness.  Yesterday accidentally hit his head on the car trunk as it was being closed.  Denies any headache, LOC, nausea or vomiting but today has been dizzy.  No neck pain, chest pain or shortness of breath  Review of Systems  Positive: Dizziness Negative: Chest pain, shortness of breath, LOC, nausea, vomiting  Physical Exam  BP (!) 150/60 (BP Location: Left Arm)   Pulse 70   Temp 98.5 F (36.9 C) (Oral)   Resp 18   Ht 5\' 9"  (1.753 m)   Wt 79.4 kg   SpO2 98%   BMI 25.84 kg/m  Gen:   Awake, no distress alert and oriented x3 Resp:  Normal effort MSK:   Moves extremities without difficulty Other:    Medical Decision Making  Medically screening exam initiated at 6:46 PM.  Appropriate orders placed.  [name] was informed that the remainder of the evaluation will be completed by another provider, this initial triage assessment does not replace that evaluation, and the importance of remaining in the ED until their evaluation is complete.  Patient on Coumadin.  Head trauma with dizziness.  We will order CT of the head as well as basic blood work.   Duanne Guess, PA-C 08/09/21 1847    Merlyn Lot, MD 08/09/21 [name]

## 2021-08-10 ENCOUNTER — Emergency Department
Admission: EM | Admit: 2021-08-10 | Discharge: 2021-08-10 | Disposition: A | Discharge status: Home / Self Care | Payer: PPO | Attending: Student in an Organized Health Care Education/Training Program | Admitting: Student in an Organized Health Care Education/Training Program

## 2021-08-10 DIAGNOSIS — S0990XA Unspecified injury of head, initial encounter: Secondary | ICD-10-CM

## 2021-08-10 DIAGNOSIS — R791 Abnormal coagulation profile: Secondary | ICD-10-CM

## 2021-08-10 DIAGNOSIS — Z950 Presence of cardiac pacemaker: Secondary | ICD-10-CM | POA: Diagnosis not present

## 2021-08-10 NOTE — Discharge Instructions (Addendum)
Your evaluation today was reassuring and you have no sign of any acute injury or illness.  We recommend you take it easy and drink plenty of fluids, take your regular medications, etc.  You may want to touch base with your primary care provider because your INR was slightly low today at 2.3.  They may have additional medication recommendations for you.    Return to the emergency department if you develop new or worsening symptoms that concern you.

## 2021-08-10 NOTE — ED Provider Notes (Signed)
[facility] Emergency Department Provider Note  ____________________________________________   Event Date/Time   First MD Initiated Contact with Patient 08/10/21 0143     (approximate)  I have reviewed the triage vital signs and the nursing notes.   HISTORY  Chief Complaint No chief complaint on file.    HPI [name] [name] is a 87 y.o. male with extensive medical history as listed below which notably includes atrial fibrillation on Coumadin and a history of a prior subdural hematoma.  He presents for evaluation of feeling a little bit lightheaded and weak after having a head injury yesterday.  He and his wife report that they were shopping and she accidentally lowered the trunk on the back of the SUV and hit him on the top of the head.  He did not lose consciousness and it seemed like a relatively minor injury.  However today he was feeling a bit lightheaded after they were out and about running some other errands.  They were worried that perhaps he had another intracranial hemorrhage.  However they also acknowledge that they have been active today and perhaps he was just worn out.  His target INR is between 2.5 and 3.5.  He said that other than being tired he feels okay at this time.  No weakness in any specific arm or leg.  Slightly increased difficulty with ambulation compared to baseline and he uses a cane or walker at baseline.  He denies headache, neck pain, chest pain, shortness of breath, nausea, vomiting, and abdominal pain.  Nothing in particular makes his symptoms better or worse and they were relatively acute in onset earlier today and are mild in severity.     Past Medical History:  Diagnosis Date   Aortic stenosis    a. 01/2016 echo: mod AS; b. 04/2018 Cardiac MRI: sev AS; c. 05/2018 s/p mech AVR @ time of myomectomy; d. 07/2018 Echo: Mild AS/AI.   Atherosclerosis of aorta ([place])    by CT scan in past   Atrial fibrillation (HCC)    and  aflutter. pt has a left atrial circuit that is not ablated. was on amiodarone-stopped, now use rate control.    Bladder cancer ([place])    hx; treated with BCG in past    Carotid artery disease ([place])    There was calcified plaque but no stenosis by carotid artery screening done at [facility] October, 2013   CHB (complete heart block) [facility])    a. 05/2018 s/p BSX VVIR PPM ([facility]) in setting of bradycardia following myomectomy and AVR/MVR.   Diabetes mellitus    type not specified   Diabetic neuropathy ([place])    feet   Elevated liver enzymes    over time; hx   Excessive sweating    Glaucoma    Gout    Head injury    when slipped on ice 2004-2005. stabilized and back on Coumadin    Headache    migraines - distant past   History of cardiac cath    a. 05/2018 Cath ([facility]): Non-obstructive CAD. Mildly elevated filling pressures w/ nl CI.   HOCM (hypertrophic obstructive cardiomyopathy) ([place])    a. 01/2016 Echo: EF 65-70%, no rwma, LVOT gradient of 80-21mmHg, mod AS, SAM; b. 11/2017 Echo: EF 55-60%, near cavity obliteration in systole, mod AS, mild MS; c. 04/2018 Card MRI ([facility]): Sev LVH, EF >70%, Sev AS, LVOT obs w/ Sev MR, mild to mod TR/PR, mid-myocardial HE in basal-mid anteroseptum and inferoseptum; d.  05/2018 s/p Septal myomectomy; e. 07/2018 Echo: EF 60-65%. PASP 47mmHg.   Homocystinemia ([place])    elevated, mild    Hypercholesterolemia    treated.    Hypertension    Infection of right inner ear    Kidney mass    a. s/p laproscopic surgery woth cryoablation of a mass outside kidney followed at [facility]; b. 11/2018 CT Chest: 2cm R kidney mass.   Mediastinal adenopathy    a. 12/208 CT Chest: up to 75mm LLL lung nodule. 1.5-1.7cm subcarinal/mediastinal adenopahty/right paratracheal lymph node; b. 11/2018 CT Chest: 2.5-3cm mediastinal adenopathy.   Mitral regurgitation    a. 04/2018 Cardiac MRI: sev MR; b. 05/2018 s/p mech MVR @ time of myomectomy; c. 07/2018 Echo: Mild MS.   Motion sickness     ocean boats   Nausea    Nonobstructive coronary artery disease    a. 05/2018 Cath ([facility]): nonobs CAD.   Obstructive sleep apnea    CPAP started successfully 2014   Orthostatic hypotension    a. orthostatic. dehydration. hospitalized 11/11   Sleep apnea    Significant obstructive sleep apnea diagnosed in August, 2012, the patient is to receive CPAP    Stroke [facility])    "2 old strokes" CT and MRI. [place] hospital 11/11. diagnosis was dehydration, no acutal reports.    TSH elevation    on synthroid historically    Unsteady gait    August, 2012    Patient Active Problem List   Diagnosis Date Noted   Abnormal CT scan    Abnormal findings on radiological examination of gastrointestinal tract    Polyp of colon    Cancer of right kidney ([place]) 12/10/2018   Pulmonary nodules 12/01/2018   Mediastinal lymphadenopathy 12/01/2018   Heart block AV third degree ([place]) 10/07/2018   Pacemaker 10/07/2018   H/O aortic valve replacement 10/07/2018   Blood in stool    Bleeding duodenal ulcer    GIB (gastrointestinal bleeding) 06/30/2018   Acute upper GI bleed    Symptomatic anemia    Heart murmur, systolic 16/07/9603   H/O: rheumatic fever 03/21/2017   Tinnitus 10/30/2016   On warfarin therapy 07/06/2015   Arteriosclerosis of coronary artery 06/13/2015   Carcinoma in situ of bladder 06/13/2015   Diabetes mellitus, type 2 ([place]) 06/13/2015   Diabetic neuropathy ([place]) 06/13/2015   Dizziness 06/13/2015   Essential (primary) hypertension 06/13/2015   Fatigue 06/13/2015   Glaucoma 06/13/2015   Hypercholesteremia 06/13/2015   Male hypogonadism 06/13/2015   Adult hypothyroidism 06/13/2015   Arthritis, degenerative 06/13/2015   CAP (community acquired pneumonia) 06/13/2015   Adenocarcinoma, renal cell ([place]) 06/13/2015   Benign head tremor 12/13/2014   Carotid arterial disease ([place]) 07/12/2014   Decreased cardiac output 07/12/2014   Cardiomyopathy, hypertrophic obstructive ([place]) 07/12/2014    Head injuries 07/12/2014   HOCM (hypertrophic obstructive cardiomyopathy) ([place])    Encounter for therapeutic drug monitoring 11/16/2013   Obstructive sleep apnea    Benign prostatic hypertrophy without urinary obstruction 06/09/2012   History of neoplasm of bladder 06/09/2012   Head injury    Atherosclerosis of aorta (HCC)    Hypotension    Stroke ([place])    Unsteady gait    Excessive sweating    Nausea    ORTHOSTATIC HYPOTENSION, HX OF 09/01/2010   Overweight(278.02) 03/07/2009   MITRAL REGURGITATION 03/05/2009   Chronic atrial fibrillation ([place]) 03/05/2009   Atrial fibrillation and flutter ([place]) 03/05/2009   Mitral and aortic incompetence 03/05/2009    Past Surgical History:  Procedure Laterality Date   BLADDER SURGERY     CATARACT EXTRACTION W/PHACO Left 05/11/2015   Procedure: CATARACT EXTRACTION PHACO AND INTRAOCULAR LENS PLACEMENT ([place]);  Surgeon: Leandrew Koyanagi, MD;  Location: [place];  Service: Ophthalmology;  Laterality: Left;  DIABETIC - oral meds, CPAP   COLONOSCOPY WITH PROPOFOL N/A 04/14/2019   Procedure: COLONOSCOPY WITH PROPOFOL;  Surgeon: [name], MD;  Location: [facility] ENDOSCOPY;  Service: Endoscopy;  Laterality: N/A;   ESOPHAGOGASTRODUODENOSCOPY (EGD) WITH PROPOFOL N/A 07/01/2018   Procedure: ESOPHAGOGASTRODUODENOSCOPY (EGD) WITH PROPOFOL;  Surgeon: [name], MD;  Location: [facility] ENDOSCOPY;  Service: Endoscopy;  Laterality: N/A;   INSERT / REPLACE / [place]     "froze mass"   MECHANICAL AORTIC AND MITRAL VALVE REPLACEMENT  05/2018   [facility]    TONSILLECTOMY     VALVE REPLACEMENT      Prior to Admission medications   Medication Sig Start Date End Date Taking? Authorizing Provider  alendronate (FOSAMAX) 70 MG tablet Take 1 tablet (70 mg total) by mouth once a week. Take with a full glass of water on an empty stomach. 07/07/21   [name], MD  ALPRAZolam Duanne Moron) 0.25 MG tablet Take 1  tablet (0.25 mg total) by mouth every 6 (six) hours as needed for anxiety. Patient not taking: No sig reported 12/17/18   [name], MD  aspirin EC 81 MG tablet Take 1 tablet (81 mg total) by mouth daily. 09/04/18   Deboraha Sprang, MD  atorvastatin (LIPITOR) 40 MG tablet Take 1 tablet (40 mg total) by mouth daily. 11/01/20   [name], MD  glucose blood [facility] ULTRA) test strip USE 1 STRIP TO CHECK GLUCOSE ONCE DAILY 11/02/20   [name], MD  Lancets [facility] DELICA PLUS [id number]) MISC USE 1  TO CHECK GLUCOSE ONCE DAILY 08/06/20   [name], MD  latanoprost (XALATAN) 0.005 % ophthalmic solution Place 1 drop into both eyes at bedtime.  11/13/13   [provider]  levothyroxine (SYNTHROID) 50 MCG tablet Take 1 tablet by mouth once daily 07/25/21   [name], MD  meclizine (ANTIVERT) 12.5 MG tablet TAKE 1 TABLET BY MOUTH THREE TIMES DAILY AS NEEDED FOR DIZZINESS 08/08/21   [name], MD  metFORMIN (GLUCOPHAGE) 1000 MG tablet Take 1 tablet by mouth once daily with breakfast 07/10/21   [name], MD  metoprolol tartrate (LOPRESSOR) 25 MG tablet Take 1/2 (one-half) tablet by mouth twice daily 03/30/21   Wellington Hampshire, MD  [facility] DELICA LANCETS 10G MISC USE ONE LANCET TO CHECK BLOOD GLUCOSE ONCE DAILY 03/25/17   [name], MD  [facility] ULTRA test strip USE 1 STRIP TO CHECK GLUCOSE ONCE DAILY 03/30/21   [name], MD  pantoprazole (PROTONIX) 40 MG tablet Take 1 tablet by mouth twice daily 06/07/20   [name], MD  sertraline (ZOLOFT) 25 MG tablet Take 1 tablet by mouth every morning. 06/06/21   [provider]  warfarin (COUMADIN) 3 MG tablet TAKE AS DIRECTED BY ANTI-COAG CLINIC 07/18/21   Minna Merritts, MD    Allergies Macrolides and ketolides, Mycinettes, Nitrofuran derivatives, Nitrofurantoin, Erythromycin, Zofran [ondansetron hcl-dextrose], and Zofran  [ondansetron hcl]  Family History  Problem Relation Age of Onset   Arrhythmia Father        A-Fib   Prostate  cancer Father    Stroke Father    Breast cancer Mother    Arrhythmia Brother        A-Fib   Heart attack Neg Hx    Hypertension Neg Hx     Social History Social History   Tobacco Use   Smoking status: Former    Types: Cigars, Cigarettes    Quit date: 10/02/1974    Years since quitting: 46.8   Smokeless tobacco: Never   Tobacco comments:    still smokes cigars once a day  Vaping Use   Vaping Use: Never used  Substance Use Topics   Alcohol use: No   Drug use: No    Review of Systems Constitutional: No fever/chills.  Generalized weakness. Eyes: No visual changes. ENT: No sore throat. Cardiovascular: Denies chest pain. Respiratory: Denies shortness of breath. Gastrointestinal: No abdominal pain.  No nausea, no vomiting.  No diarrhea.  No constipation. Genitourinary: Negative for dysuria. Musculoskeletal: Negative for neck pain.  Negative for back pain. Integumentary: Negative for rash. Neurological: Generalized weakness.  Negative for headaches, focal weakness or numbness.   ____________________________________________   PHYSICAL EXAM:  VITAL SIGNS: ED Triage Vitals  Enc Vitals Group     BP 08/09/21 1828 (!) 150/60     Pulse Rate 08/09/21 1828 70     Resp 08/09/21 1828 18     Temp 08/09/21 1828 98.5 F (36.9 C)     Temp Source 08/09/21 1828 Oral     SpO2 08/09/21 1828 98 %     Weight 08/09/21 1825 79.4 kg (175 lb)     Height 08/09/21 1825 1.753 m (5\' 9" )     Head Circumference --      Peak Flow --      Pain Score 08/09/21 1825 0     Pain Loc --      Pain Edu? --      Excl. in [place]? --     Constitutional: Alert and oriented.  Elderly but in no acute distress. Eyes: Conjunctivae are normal.  Pupils are equal. Head: Small abrasion on the crown of the scalp.  No large hematoma nor laceration. Nose: No congestion/rhinnorhea. Mouth/Throat: Patient  is wearing a mask. Neck: No stridor.  No meningeal signs.   Cardiovascular: Normal rate, regular rhythm. Good peripheral circulation. Respiratory: Normal respiratory effort.  No retractions. Gastrointestinal: Soft and nontender. No distention.  Musculoskeletal: No lower extremity tenderness nor edema. No gross deformities of extremities. Neurologic:  Normal speech and language. No gross focal neurologic deficits are appreciated.  Skin:  Skin is warm, dry and intact. Psychiatric: Mood and affect are normal. Speech and behavior are normal.  ____________________________________________   LABS (all labs ordered are listed, but only abnormal results are displayed)  Labs Reviewed  BASIC METABOLIC PANEL - Abnormal; Notable for the following components:      Result Value   Calcium 8.8 (*)    All other components within normal limits  CBC - Abnormal; Notable for the following components:   RBC 3.73 (*)    Hemoglobin 12.1 (*)    HCT 35.2 (*)    All other components within normal limits  URINALYSIS, ROUTINE W REFLEX MICROSCOPIC - Abnormal; Notable for the following components:   Color, Urine YELLOW (*)    APPearance CLEAR (*)    All other components within normal limits  PROTIME-INR - Abnormal; Notable for the following components:   Prothrombin Time 25.6 (*)    INR 2.3 (*)    All  other components within normal limits  CBG MONITORING, ED - Abnormal; Notable for the following components:   Glucose-Capillary 105 (*)    All other components within normal limits  TROPONIN I (HIGH SENSITIVITY)  TROPONIN I (HIGH SENSITIVITY)   ____________________________________________  EKG  ED ECG REPORT I, Hinda Kehr, the attending physician, personally viewed and interpreted this ECG.  Date: 08/09/2021 EKG Time: 18: 44 Rate: 72 Rhythm: paced rhythm QRS Axis: paced rhythm Intervals: abnormal due to pacemaker ST/T Wave abnormalities: Non-specific ST segment / T-wave changes, but no evidence of  acute ischemia. Narrative Interpretation: No acute ischemia evidence of paced rhythm  ____________________________________________  RADIOLOGY I, Hinda Kehr, personally viewed and evaluated these images (plain radiographs) as part of my medical decision making, as well as reviewing the written report by the radiologist.  ED MD interpretation: No acute abnormalities on head CT  Official radiology report(s): CT Head Wo Contrast  Result Date: 08/09/2021 CLINICAL DATA:  Head trauma EXAM: CT HEAD WITHOUT CONTRAST TECHNIQUE: Contiguous axial images were obtained from the base of the skull through the vertex without intravenous contrast. COMPARISON:  CT head 04/30/2011 BRAIN: BRAIN Cerebral ventricle sizes are concordant with the degree of cerebral volume loss. Patchy and confluent areas of decreased attenuation are noted throughout the deep and periventricular white matter of the cerebral hemispheres bilaterally, compatible with chronic microvascular ischemic disease. No evidence of large-territorial acute infarction. No parenchymal hemorrhage. No mass lesion. No extra-axial collection. No mass effect or midline shift. No hydrocephalus. Basilar cisterns are patent. Vascular: No hyperdense vessel. Skull: No acute fracture or focal lesion. Sinuses/Orbits: Paranasal sinuses and mastoid air cells are clear. The orbits are unremarkable. Other: None. IMPRESSION: No acute intracranial abnormality. Electronically Signed   By: Iven Finn M.D.   On: 08/09/2021 19:28    ____________________________________________   PROCEDURES   Procedure(s) performed (including Critical Care):  Procedures   ____________________________________________   INITIAL IMPRESSION / MDM / [place] / ED COURSE  As part of my medical decision making, I reviewed the following data within the [place] History obtained from family, Nursing notes reviewed and incorporated, Labs reviewed , EKG  interpreted , Old chart reviewed, and Notes from prior ED visits   Differential diagnosis includes, but is not limited to, minor head injury, generalized nonspecific weakness, intracranial hemorrhage, subdural hematoma.  Physical exam is generally reassuring.  Vital signs are stable and within normal limits.  Paced rhythm on EKG with no chest pain or shortness of breath.  Lab work is within normal limits including CBC, basic metabolic panel, urinalysis, high-sensitivity troponin.  His INR is slightly subtherapeutic for his target but generally appropriate at 2.3.  Most importantly his head CT is reassuring with no evidence of acute injury.  I provided this reassurance to the patient and his wife and they are both comfortable with the evaluation and plan for discharge.  No indication of acute injury or need for additional evaluation or treatment.  I gave my usual and customary return precautions and follow-up recommendations including following up with the PCP to discuss his slightly subtherapeutic INR.           ____________________________________________  FINAL CLINICAL IMPRESSION(S) / ED DIAGNOSES  Final diagnoses:  Minor head injury, initial encounter  Subtherapeutic international normalized ratio (INR)     MEDICATIONS GIVEN DURING THIS VISIT:  Medications - No data to display   ED Discharge Orders     None  Note:  This document was prepared using Dragon voice recognition software and may include unintentional dictation errors.   Hinda Kehr, MD 08/10/21 [phone]

## 2021-08-11 DIAGNOSIS — H401131 Primary open-angle glaucoma, bilateral, mild stage: Secondary | ICD-10-CM | POA: Diagnosis not present

## 2021-08-22 DIAGNOSIS — S32010G Wedge compression fracture of first lumbar vertebra, subsequent encounter for fracture with delayed healing: Secondary | ICD-10-CM | POA: Diagnosis not present

## 2021-08-23 DIAGNOSIS — N289 Disorder of kidney and ureter, unspecified: Secondary | ICD-10-CM | POA: Diagnosis not present

## 2021-08-23 DIAGNOSIS — M4316 Spondylolisthesis, lumbar region: Secondary | ICD-10-CM | POA: Diagnosis not present

## 2021-08-23 DIAGNOSIS — S32010A Wedge compression fracture of first lumbar vertebra, initial encounter for closed fracture: Secondary | ICD-10-CM | POA: Diagnosis not present

## 2021-08-23 DIAGNOSIS — Z8739 Personal history of other diseases of the musculoskeletal system and connective tissue: Secondary | ICD-10-CM | POA: Diagnosis not present

## 2021-08-24 ENCOUNTER — Encounter: Payer: Self-pay | Admitting: Urology

## 2021-08-24 ENCOUNTER — Ambulatory Visit (INDEPENDENT_AMBULATORY_CARE_PROVIDER_SITE_OTHER): Payer: PPO | Admitting: Urology

## 2021-08-24 ENCOUNTER — Other Ambulatory Visit: Payer: Self-pay

## 2021-08-24 VITALS — BP 139/64 | HR 93 | Ht 69.0 in | Wt 175.0 lb

## 2021-08-24 DIAGNOSIS — Z8603 Personal history of neoplasm of uncertain behavior: Secondary | ICD-10-CM

## 2021-08-24 LAB — MICROSCOPIC EXAMINATION
Bacteria, UA: NONE SEEN
Epithelial Cells (non renal): NONE SEEN /hpf (ref 0–10)

## 2021-08-24 LAB — URINALYSIS, COMPLETE
Bilirubin, UA: NEGATIVE
Glucose, UA: NEGATIVE
Ketones, UA: NEGATIVE
Leukocytes,UA: NEGATIVE
Nitrite, UA: NEGATIVE
Protein,UA: NEGATIVE
RBC, UA: NEGATIVE
Specific Gravity, UA: 1.01 (ref 1.005–1.030)
Urobilinogen, Ur: 0.2 mg/dL (ref 0.2–1.0)
pH, UA: 6 (ref 5.0–7.5)

## 2021-08-24 NOTE — Progress Notes (Signed)
   08/24/21  CC:  Chief Complaint  Patient presents with   Cysto    HPI: Urothelial carcinoma in situ 2002 Induction BCG No recurrences Presents today for surveillance cystoscopy and has no complaints  Blood pressure 139/64, pulse 93, height 5\' 9"  (1.753 m), weight 175 lb (79.4 kg). NED. A&Ox3.   No respiratory distress   Abd soft, NT, ND Normal phallus with bilateral descended testicles  Cystoscopy Procedure Note  Patient identification was confirmed, informed consent was obtained, and patient was prepped using Betadine solution.  Lidocaine jelly was administered per urethral meatus.     Pre-Procedure: - Inspection reveals a normal caliber urethral meatus.  Procedure: The flexible cystoscope was introduced without difficulty - No urethral strictures/lesions are present. -  Moderate lateral lobe enlargement  prostate  - Normal bladder neck - Bilateral ureteral orifices identified - Bladder mucosa  reveals no ulcers, tumors, or lesions - No bladder stones - Mild trabeculation  Retroflexion shows no abnormality   Post-Procedure: - Patient tolerated the procedure well  Assessment/ Plan: No mucosal abnormalities identified Continue annual surveillance    Abbie Sons, MD

## 2021-08-27 ENCOUNTER — Other Ambulatory Visit: Payer: Self-pay | Admitting: Cardiovascular Disease

## 2021-08-30 ENCOUNTER — Other Ambulatory Visit: Payer: Self-pay

## 2021-08-30 ENCOUNTER — Ambulatory Visit: Payer: PPO

## 2021-08-30 DIAGNOSIS — Z952 Presence of prosthetic heart valve: Secondary | ICD-10-CM | POA: Diagnosis not present

## 2021-08-30 DIAGNOSIS — I482 Chronic atrial fibrillation, unspecified: Secondary | ICD-10-CM | POA: Diagnosis not present

## 2021-08-30 DIAGNOSIS — I4892 Unspecified atrial flutter: Secondary | ICD-10-CM

## 2021-08-30 DIAGNOSIS — Z950 Presence of cardiac pacemaker: Secondary | ICD-10-CM | POA: Diagnosis not present

## 2021-08-30 DIAGNOSIS — Z5181 Encounter for therapeutic drug level monitoring: Secondary | ICD-10-CM

## 2021-08-30 DIAGNOSIS — I4891 Unspecified atrial fibrillation: Secondary | ICD-10-CM | POA: Diagnosis not present

## 2021-08-30 LAB — POCT INR: INR: 2.8 (ref 2.0–3.0)

## 2021-08-30 NOTE — Patient Instructions (Signed)
-   continue Warfarin 1.5 tablets every day EXCEPT 1 tablet on [place]. - Recheck INR in 6 weeks.

## 2021-09-05 ENCOUNTER — Telehealth: Payer: Self-pay | Admitting: Family Medicine

## 2021-09-05 DIAGNOSIS — K279 Peptic ulcer, site unspecified, unspecified as acute or chronic, without hemorrhage or perforation: Secondary | ICD-10-CM

## 2021-09-05 MED ORDER — PANTOPRAZOLE SODIUM 40 MG PO TBEC: 40.0000 mg | DELAYED_RELEASE_TABLET | Freq: Two times a day (BID) | ORAL | 2 refills | Status: DC

## 2021-09-05 NOTE — Telephone Encounter (Signed)
[facility] faxed refill request for the following medications:   pantoprazole (PROTONIX) 40 MG tablet   Please advise.

## 2021-09-05 NOTE — Addendum Note (Signed)
Addended by: Julieta Bellini on: 09/05/2021 04:04 PM   Modules accepted: Orders

## 2021-09-10 ENCOUNTER — Other Ambulatory Visit: Payer: Self-pay | Admitting: Cardiovascular Disease

## 2021-09-13 DIAGNOSIS — Z86018 Personal history of other benign neoplasm: Secondary | ICD-10-CM | POA: Diagnosis not present

## 2021-09-13 DIAGNOSIS — L578 Other skin changes due to chronic exposure to nonionizing radiation: Secondary | ICD-10-CM | POA: Diagnosis not present

## 2021-09-13 DIAGNOSIS — Z872 Personal history of diseases of the skin and subcutaneous tissue: Secondary | ICD-10-CM | POA: Diagnosis not present

## 2021-09-13 DIAGNOSIS — L821 Other seborrheic keratosis: Secondary | ICD-10-CM | POA: Diagnosis not present

## 2021-09-13 DIAGNOSIS — L570 Actinic keratosis: Secondary | ICD-10-CM | POA: Diagnosis not present

## 2021-09-28 DIAGNOSIS — Z950 Presence of cardiac pacemaker: Secondary | ICD-10-CM | POA: Diagnosis not present

## 2021-09-28 DIAGNOSIS — I442 Atrioventricular block, complete: Secondary | ICD-10-CM | POA: Diagnosis not present

## 2021-09-28 DIAGNOSIS — Z7901 Long term (current) use of anticoagulants: Secondary | ICD-10-CM | POA: Diagnosis not present

## 2021-09-28 DIAGNOSIS — I421 Obstructive hypertrophic cardiomyopathy: Secondary | ICD-10-CM | POA: Diagnosis not present

## 2021-09-28 DIAGNOSIS — Z45018 Encounter for adjustment and management of other part of cardiac pacemaker: Secondary | ICD-10-CM | POA: Diagnosis not present

## 2021-10-07 DIAGNOSIS — U071 COVID-19: Secondary | ICD-10-CM | POA: Diagnosis not present

## 2021-10-15 ENCOUNTER — Other Ambulatory Visit: Payer: Self-pay | Admitting: Cardiovascular Disease

## 2021-10-17 ENCOUNTER — Telehealth: Payer: Self-pay | Admitting: Family Medicine

## 2021-10-17 DIAGNOSIS — D692 Other nonthrombocytopenic purpura: Secondary | ICD-10-CM | POA: Diagnosis not present

## 2021-10-17 DIAGNOSIS — R531 Weakness: Secondary | ICD-10-CM | POA: Diagnosis not present

## 2021-10-17 DIAGNOSIS — Z7901 Long term (current) use of anticoagulants: Secondary | ICD-10-CM | POA: Diagnosis not present

## 2021-10-17 DIAGNOSIS — E119 Type 2 diabetes mellitus without complications: Secondary | ICD-10-CM | POA: Diagnosis not present

## 2021-10-17 DIAGNOSIS — Z87828 Personal history of other (healed) physical injury and trauma: Secondary | ICD-10-CM

## 2021-10-17 DIAGNOSIS — I700 Atherosclerosis of aorta: Secondary | ICD-10-CM | POA: Diagnosis not present

## 2021-10-17 DIAGNOSIS — M4850XD Collapsed vertebra, not elsewhere classified, site unspecified, subsequent encounter for fracture with routine healing: Secondary | ICD-10-CM | POA: Diagnosis not present

## 2021-10-17 DIAGNOSIS — Z8616 Personal history of COVID-19: Secondary | ICD-10-CM | POA: Diagnosis not present

## 2021-10-17 DIAGNOSIS — Z8551 Personal history of malignant neoplasm of bladder: Secondary | ICD-10-CM | POA: Diagnosis not present

## 2021-10-17 DIAGNOSIS — Z7982 Long term (current) use of aspirin: Secondary | ICD-10-CM | POA: Diagnosis not present

## 2021-10-17 DIAGNOSIS — Z7984 Long term (current) use of oral hypoglycemic drugs: Secondary | ICD-10-CM | POA: Diagnosis not present

## 2021-10-17 DIAGNOSIS — I480 Paroxysmal atrial fibrillation: Secondary | ICD-10-CM | POA: Diagnosis not present

## 2021-10-17 DIAGNOSIS — Z85528 Personal history of other malignant neoplasm of kidney: Secondary | ICD-10-CM | POA: Diagnosis not present

## 2021-10-17 NOTE — Telephone Encounter (Signed)
Warfarin refill:  LOV 01/13/21  Rod Can MD  Last INR  2.8 on 08/30/21  Next INR 10/18/21  Refill approved

## 2021-10-17 NOTE — Telephone Encounter (Signed)
Refill request

## 2021-10-17 NOTE — Telephone Encounter (Signed)
Ladell Heads, NP calling from [facility] is calling to request orders PT for a spinal injury. [facility] typically does not place the order Cb- [phone]

## 2021-10-18 ENCOUNTER — Ambulatory Visit (INDEPENDENT_AMBULATORY_CARE_PROVIDER_SITE_OTHER): Payer: PPO

## 2021-10-18 ENCOUNTER — Other Ambulatory Visit: Payer: Self-pay

## 2021-10-18 DIAGNOSIS — I482 Chronic atrial fibrillation, unspecified: Secondary | ICD-10-CM

## 2021-10-18 DIAGNOSIS — Z5181 Encounter for therapeutic drug level monitoring: Secondary | ICD-10-CM

## 2021-10-18 DIAGNOSIS — Z952 Presence of prosthetic heart valve: Secondary | ICD-10-CM | POA: Diagnosis not present

## 2021-10-18 DIAGNOSIS — M6530 Trigger finger, unspecified finger: Secondary | ICD-10-CM | POA: Insufficient documentation

## 2021-10-18 DIAGNOSIS — I4891 Unspecified atrial fibrillation: Secondary | ICD-10-CM

## 2021-10-18 DIAGNOSIS — Z950 Presence of cardiac pacemaker: Secondary | ICD-10-CM

## 2021-10-18 DIAGNOSIS — I4892 Unspecified atrial flutter: Secondary | ICD-10-CM | POA: Diagnosis not present

## 2021-10-18 DIAGNOSIS — M65341 Trigger finger, right ring finger: Secondary | ICD-10-CM | POA: Diagnosis not present

## 2021-10-18 LAB — POCT INR: INR: 3.7 — AB (ref 2.0–3.0)

## 2021-10-18 NOTE — Patient Instructions (Signed)
-   take 1/2 tablet warfarin tonight, then  - continue Warfarin 1.5 tablets every day EXCEPT 1 tablet on MONDAYS & FRIDAYS. - Recheck INR in 6 weeks.

## 2021-11-01 ENCOUNTER — Other Ambulatory Visit: Payer: Self-pay | Admitting: Family Medicine

## 2021-11-06 ENCOUNTER — Telehealth: Payer: Self-pay | Admitting: Cardiovascular Disease

## 2021-11-06 NOTE — Telephone Encounter (Signed)
Returned call to pt/wife. There was no answer so left a message for them to call back regarding the message that was left.

## 2021-11-06 NOTE — Telephone Encounter (Signed)
Pt c/o medication issue:  1. Name of Medication:  warfarin   2. How are you currently taking this medication (dosage and times per day)? Please advise   3. Are you having a reaction (difficulty breathing--STAT)? No   4. What is your medication issue? Patient is not sure of most recent dose . Wife thinks he may have mixed it up .

## 2021-11-06 NOTE — Telephone Encounter (Signed)
Wife returned call and stated that the pt got missed up with the dose and has been taken 2 tablets of warfarin and she was unaware until over the weekend. She is unsure of how long he has been doing this doseage. Pt is supposed to be taken 1.5 tablets daily except 1 tablet on Monday and Friday. Gave her the correct dose of warfarin he should be on, which is, 1.5 tablets daily except on 1 tablet on Mondays and Fridays. Advised to let him eat something leafy green today and since he is sure he took 2 tablets of warfarin last night to take 1/2 tablet tonight (1/23) and 1 tablet tomorrow (1/24) and keep the appt she set for him on Wednesday. Pt does not have any bleeding issues at this time. Pt does have a pillbox but he has been filling it himself. Recommended she assists with filling or overlooking the pill box. She was thankful and I advised that I would update Mandi regarding this.

## 2021-11-06 NOTE — Telephone Encounter (Signed)
Returned call to the pt/wife and had to leave another message.

## 2021-11-06 NOTE — Telephone Encounter (Signed)
Patient's wife is returning call. 

## 2021-11-07 DIAGNOSIS — Z950 Presence of cardiac pacemaker: Secondary | ICD-10-CM | POA: Diagnosis not present

## 2021-11-08 ENCOUNTER — Other Ambulatory Visit: Payer: Self-pay

## 2021-11-08 ENCOUNTER — Ambulatory Visit (INDEPENDENT_AMBULATORY_CARE_PROVIDER_SITE_OTHER): Payer: PPO

## 2021-11-08 DIAGNOSIS — I482 Chronic atrial fibrillation, unspecified: Secondary | ICD-10-CM

## 2021-11-08 DIAGNOSIS — I4891 Unspecified atrial fibrillation: Secondary | ICD-10-CM

## 2021-11-08 DIAGNOSIS — I4892 Unspecified atrial flutter: Secondary | ICD-10-CM

## 2021-11-08 DIAGNOSIS — Z5181 Encounter for therapeutic drug level monitoring: Secondary | ICD-10-CM

## 2021-11-08 DIAGNOSIS — Z950 Presence of cardiac pacemaker: Secondary | ICD-10-CM

## 2021-11-08 DIAGNOSIS — Z952 Presence of prosthetic heart valve: Secondary | ICD-10-CM | POA: Diagnosis not present

## 2021-11-08 LAB — POCT INR: INR: 3.9 — AB (ref 2.0–3.0)

## 2021-11-08 NOTE — Patient Instructions (Addendum)
-   skip warfarin tonight, then - continue Warfarin 1.5 tablets every day EXCEPT 1 tablet on [place]. - Recheck INR in 4 weeks.

## 2021-11-10 ENCOUNTER — Other Ambulatory Visit: Payer: Self-pay

## 2021-11-10 ENCOUNTER — Encounter: Payer: Self-pay | Admitting: Cardiovascular Disease

## 2021-11-10 ENCOUNTER — Ambulatory Visit: Payer: PPO | Admitting: Cardiovascular Disease

## 2021-11-10 VITALS — BP 128/68 | HR 70 | Ht 68.0 in | Wt 167.5 lb

## 2021-11-10 DIAGNOSIS — I2510 Atherosclerotic heart disease of native coronary artery without angina pectoris: Secondary | ICD-10-CM | POA: Diagnosis not present

## 2021-11-10 DIAGNOSIS — I4821 Permanent atrial fibrillation: Secondary | ICD-10-CM | POA: Diagnosis not present

## 2021-11-10 DIAGNOSIS — I421 Obstructive hypertrophic cardiomyopathy: Secondary | ICD-10-CM | POA: Diagnosis not present

## 2021-11-10 DIAGNOSIS — I38 Endocarditis, valve unspecified: Secondary | ICD-10-CM

## 2021-11-10 DIAGNOSIS — I442 Atrioventricular block, complete: Secondary | ICD-10-CM

## 2021-11-10 DIAGNOSIS — I10 Essential (primary) hypertension: Secondary | ICD-10-CM | POA: Diagnosis not present

## 2021-11-10 DIAGNOSIS — Z950 Presence of cardiac pacemaker: Secondary | ICD-10-CM | POA: Diagnosis not present

## 2021-11-10 NOTE — Progress Notes (Signed)
Cardiology Office Note   Date:  11/10/2021   ID:  [name], [name] 12/11/1933, MRN [id number]  PCP:  [name], MD  Cardiologist:   Kathlyn Sacramento, MD   Chief Complaint  Patient presents with   OTHER    OD f/u c/o weight loss, weakness, unsteady and shaking/cold hands. Meds reviewed verbally with pt.      History of Present Illness: [name] [name] is a 88 y.o. male who presents for for a follow-up visit. He has extensive past medical history including chronic atrial fibrillation, aortic stenosis, HOCM, hyperlipidemia, hypertension, type 2 diabetes mellitus, sleep apnea, mitral regurgitation, and hypothyroidism.  He was evaluated at [facility] in mid 2019 in the setting of progressive dyspnea and HOCM.  Cardiac MRI showed significant LVH with left ventricular outflow tract obstruction along with severe aortic stenosis and severe mitral regurgitation.  Right and left heart cardiac catheterization showed nonobstructive CAD and normal cardiac index.  He  subsequently underwent successful septal myomectomy with mechanical aortic valve replacement and mechanical mitral valve replacement in August 2019.  Postoperative course complicated by anemia requiring transfusion as well as pleural effusion requiring thoracentesis.  He also had complete heart block which  required placement of a [employer] single lead permanent pacemaker.  He was discharged to rehabilitation and then in September 2019, he was admitted to [facility] in the setting of GI bleeding with supratherapeutic INR.  EGD showed duodenal ulcer with a visible vessel that was treated with hemo-spray.  Follow-up echocardiogram October 2019 showed normal LV function with appropriately functioning mechanical valves and improved pulmonary artery systolic pressure.   In February of 2020, he suffered from vestibular neuritis in the right ear with significant hearing loss and balance problems.  He was seen by ENT and  treated with steroids but reports minimal improvement.  He was also diagnosed with mediastinal lymphadenopathy but he is currently being monitored by oncology.  He has been following up with [facility].  Repeat echo in 2020 showed normal LV systolic function and normal functioning mitral and aortic mechanical valves.  PA pressure was mildly elevated at 39.  No significant change from before.    Unfortunately, he fell in September which resulted in an L1 compression fracture.  He was treated conservatively.  He has been doing reasonably well from a cardiac standpoint with no chest pain or worsening dyspnea.  His gait is not as steady as before and he will be getting a motorized scooter from the [place] soon.  He lost about 30 pounds over the last year.  He does not have good appetite.   Past Medical History:  Diagnosis Date   Aortic stenosis    a. 01/2016 echo: mod AS; b. 04/2018 Cardiac MRI: sev AS; c. 05/2018 s/p mech AVR @ time of myomectomy; d. 07/2018 Echo: Mild AS/AI.   Atherosclerosis of aorta ([place])    by CT scan in past   Atrial fibrillation (HCC)    and aflutter. pt has a left atrial circuit that is not ablated. was on amiodarone-stopped, now use rate control.    Bladder cancer ([place])    hx; treated with BCG in past    Carotid artery disease ([place])    There was calcified plaque but no stenosis by carotid artery screening done at [facility] October, 2013   CHB (complete heart block) [facility])    a. 05/2018 s/p BSX VVIR PPM ([facility]) in setting of bradycardia following myomectomy and AVR/MVR.  Diabetes mellitus    type not specified   Diabetic neuropathy ([place])    feet   Elevated liver enzymes    over time; hx   Excessive sweating    Glaucoma    Gout    Head injury    when slipped on ice 2004-2005. stabilized and back on Coumadin    Headache    migraines - distant past   History of cardiac cath    a. 05/2018 Cath ([facility]): Non-obstructive CAD. Mildly elevated filling pressures w/  nl CI.   HOCM (hypertrophic obstructive cardiomyopathy) ([place])    a. 01/2016 Echo: EF 65-70%, no rwma, LVOT gradient of 80-43mmHg, mod AS, SAM; b. 11/2017 Echo: EF 55-60%, near cavity obliteration in systole, mod AS, mild MS; c. 04/2018 Card MRI ([facility]): Sev LVH, EF >70%, Sev AS, LVOT obs w/ Sev MR, mild to mod TR/PR, mid-myocardial HE in basal-mid anteroseptum and inferoseptum; d. 05/2018 s/p Septal myomectomy; e. 07/2018 Echo: EF 60-65%. PASP 55mmHg.   Homocystinemia ([place])    elevated, mild    Hypercholesterolemia    treated.    Hypertension    Infection of right inner ear    Kidney mass    a. s/p laproscopic surgery woth cryoablation of a mass outside kidney followed at [facility]; b. 11/2018 CT Chest: 2cm R kidney mass.   Mediastinal adenopathy    a. 12/208 CT Chest: up to 22mm LLL lung nodule. 1.5-1.7cm subcarinal/mediastinal adenopahty/right paratracheal lymph node; b. 11/2018 CT Chest: 2.5-3cm mediastinal adenopathy.   Mitral regurgitation    a. 04/2018 Cardiac MRI: sev MR; b. 05/2018 s/p mech MVR @ time of myomectomy; c. 07/2018 Echo: Mild MS.   Motion sickness    ocean boats   Nausea    Nonobstructive coronary artery disease    a. 05/2018 Cath ([facility]): nonobs CAD.   Obstructive sleep apnea    CPAP started successfully 2014   Orthostatic hypotension    a. orthostatic. dehydration. hospitalized 11/11   Sleep apnea    Significant obstructive sleep apnea diagnosed in August, 2012, the patient is to receive CPAP    Stroke [facility])    "2 old strokes" CT and MRI. [facility] 11/11. diagnosis was dehydration, no acutal reports.    TSH elevation    on synthroid historically    Unsteady gait    August, 2012    Past Surgical History:  Procedure Laterality Date   BLADDER SURGERY     CATARACT EXTRACTION W/PHACO Left 05/11/2015   Procedure: CATARACT EXTRACTION PHACO AND INTRAOCULAR LENS PLACEMENT ([place]);  Surgeon: Leandrew Koyanagi, MD;  Location: [facility];  Service: Ophthalmology;   Laterality: Left;  DIABETIC - oral meds, CPAP   COLONOSCOPY WITH PROPOFOL N/A 04/14/2019   Procedure: COLONOSCOPY WITH PROPOFOL;  Surgeon: [name], MD;  Location: [facility] ENDOSCOPY;  Service: Endoscopy;  Laterality: N/A;   ESOPHAGOGASTRODUODENOSCOPY (EGD) WITH PROPOFOL N/A 07/01/2018   Procedure: ESOPHAGOGASTRODUODENOSCOPY (EGD) WITH PROPOFOL;  Surgeon: [name], MD;  Location: [facility] ENDOSCOPY;  Service: Endoscopy;  Laterality: N/A;   INSERT / REPLACE / [place]     "froze mass"   MECHANICAL AORTIC AND MITRAL VALVE REPLACEMENT  05/2018   [facility]    TONSILLECTOMY     VALVE REPLACEMENT       Current Outpatient Medications  Medication Sig Dispense Refill   alendronate (FOSAMAX) 70 MG tablet Take 1 tablet (70 mg total) by mouth once a week. Take with a  full glass of water on an empty stomach. 4 tablet 11   aspirin EC 81 MG tablet Take 1 tablet (81 mg total) by mouth daily.     atorvastatin (LIPITOR) 40 MG tablet Take 1 tablet by mouth once daily 90 tablet 0   glucose blood (ONETOUCH ULTRA) test strip USE 1 STRIP TO CHECK GLUCOSE ONCE DAILY 50 each 0   Lancets (ONETOUCH DELICA PLUS [id number]) MISC USE 1  TO CHECK GLUCOSE ONCE DAILY 100 each 6   latanoprost (XALATAN) 0.005 % ophthalmic solution Place 1 drop into both eyes at bedtime.      levothyroxine (SYNTHROID) 50 MCG tablet Take 1 tablet by mouth once daily 90 tablet 3   meclizine (ANTIVERT) 12.5 MG tablet TAKE 1 TABLET BY MOUTH THREE TIMES DAILY AS NEEDED FOR DIZZINESS 90 tablet 0   metFORMIN (GLUCOPHAGE) 1000 MG tablet Take 1 tablet by mouth once daily with breakfast 90 tablet 3   metoprolol tartrate (LOPRESSOR) 25 MG tablet Take 1/2 (one-half) tablet by mouth twice daily. PLEASE CALL OFFICE TO SCHEDULE AN APPOINTMENT FOR FURTHER REFILLS. 90 tablet 0   ONETOUCH DELICA LANCETS 30Q MISC USE ONE LANCET TO CHECK BLOOD GLUCOSE ONCE DAILY 100 each 12   ONETOUCH ULTRA test strip USE 1 STRIP TO  CHECK GLUCOSE ONCE DAILY 50 each 0   pantoprazole (PROTONIX) 40 MG tablet Take 1 tablet (40 mg total) by mouth 2 (two) times daily. 180 tablet 2   sertraline (ZOLOFT) 25 MG tablet Take 1 tablet by mouth every morning.     warfarin (COUMADIN) 3 MG tablet TAKE AS DIRECTED BY  ANTI-COAG  CLINIC 60 tablet 3   ALPRAZolam (XANAX) 0.25 MG tablet Take 1 tablet (0.25 mg total) by mouth every 6 (six) hours as needed for anxiety. (Patient not taking: Reported on 06/14/2021) 60 tablet 0   No current facility-administered medications for this visit.    Allergies:   Macrolides and ketolides, Mycinettes, Nitrofuran derivatives, Nitrofurantoin, Erythromycin, Zofran [ondansetron hcl-dextrose], and Zofran [ondansetron hcl]    Social History:  The patient  reports that he quit smoking about 47 years ago. His smoking use included cigars and cigarettes. He has never used smokeless tobacco. He reports that he does not drink alcohol and does not use drugs.   Family History:  The patient's family history includes Arrhythmia in his brother and father; Breast cancer in his mother; Prostate cancer in his father; Stroke in his father.    ROS:  Please see the history of present illness.   Otherwise, review of systems are positive for none.   All other systems are reviewed and negative.    PHYSICAL EXAM: VS:  BP 128/68 (BP Location: Left Arm, Patient Position: Sitting, Cuff Size: Normal)    Pulse 70    Ht 5\' 8"  (1.727 m)    Wt 167 lb 8 oz (76 kg)    SpO2 95%    BMI 25.47 kg/m  , BMI Body mass index is 25.47 kg/m. GEN: Well nourished, well developed, in no acute distress  HEENT: normal  Neck: no JVD, carotid bruits, or masses Cardiac: Regular rate and rhythm; no rubs, or gallops,no edema .  Normal mechanical heart sound. Respiratory:  clear to auscultation bilaterally, normal work of breathing GI: soft, nontender, nondistended, + BS MS: no deformity or atrophy  Skin: warm and dry, no rash Neuro:  Strength and  sensation are intact Psych: euthymic mood, full affect   EKG:  EKG is ordered today. The ekg ordered  today demonstrates ventricular paced rhythm with underlying atrial fibrillation.   Recent Labs: 06/14/2021: ALT 17 08/09/2021: BUN 14; Creatinine, Ser 0.84; Hemoglobin 12.1; Platelets 180; Potassium 4.3; Sodium 138    Lipid Panel    Component Value Date/Time   CHOL 178 10/25/2020 1006   TRIG 153 (H) 10/25/2020 1006   HDL 41 10/25/2020 1006   CHOLHDL 4.3 10/25/2020 1006   LDLCALC 110 (H) 10/25/2020 1006      Wt Readings from Last 3 Encounters:  11/10/21 167 lb 8 oz (76 kg)  08/24/21 175 lb (79.4 kg)  08/09/21 175 lb (79.4 kg)        ASSESSMENT AND PLAN:   1.  Hypertrophic obstructive cardiomyopathy: Status post septal myomectomy at [facility] in 2019 with mechanical mitral and aortic valve replacements.  Doing well overall with no symptoms of heart failure.   2.  Valvular heart disease: Status post mechanical mitral and aortic valve replacements.  He is chronically anticoagulated with Coumadin and followed closely in our Coumadin clinic.  Normal functioning valves on echo in October 2019.  Continue low-dose aspirin as well.   3.  Complete heart block: This developed following septal myomectomy.  He is status post single lead Product/process development scientist and this is followed at [facility].    4.  Permanent atrial fibrillation: Rate controlled on beta-blocker therapy.  Anticoagulated with Coumadin.   5.  Essential hypertension: Blood pressure is controlled on small dose metoprolol.  He is no longer on amlodipine.   5.  Nonobstructive CAD: Minimal CAD on catheterization in August 2019.  Continue aspirin, beta-blocker, and statin therapy.    7.  Hyperlipidemia: He remains on statin therapy.  8.  Recurrent falls: We discussed methods of avoiding physical injuries especially in the setting of lifelong anticoagulation.  Overall, there has been a gradual decline in his functional capacity over  the last year.     Disposition:   FU with me in 6 months  Signed,  Kathlyn Sacramento, MD  11/10/2021 12:56 PM    [place]

## 2021-11-10 NOTE — Patient Instructions (Signed)

## 2021-11-14 DIAGNOSIS — M65341 Trigger finger, right ring finger: Secondary | ICD-10-CM | POA: Diagnosis not present

## 2021-11-15 ENCOUNTER — Encounter: Payer: Self-pay | Admitting: Family Medicine

## 2021-11-15 ENCOUNTER — Other Ambulatory Visit: Payer: Self-pay

## 2021-11-15 ENCOUNTER — Ambulatory Visit (INDEPENDENT_AMBULATORY_CARE_PROVIDER_SITE_OTHER): Payer: PPO | Admitting: Family Medicine

## 2021-11-15 VITALS — BP 149/70 | HR 64 | Temp 98.4°F | Resp 18 | Ht 69.0 in | Wt 166.0 lb

## 2021-11-15 DIAGNOSIS — E119 Type 2 diabetes mellitus without complications: Secondary | ICD-10-CM | POA: Diagnosis not present

## 2021-11-15 DIAGNOSIS — G3184 Mild cognitive impairment, so stated: Secondary | ICD-10-CM | POA: Diagnosis not present

## 2021-11-15 DIAGNOSIS — I4892 Unspecified atrial flutter: Secondary | ICD-10-CM | POA: Diagnosis not present

## 2021-11-15 DIAGNOSIS — I4891 Unspecified atrial fibrillation: Secondary | ICD-10-CM

## 2021-11-15 DIAGNOSIS — Z461 Encounter for fitting and adjustment of hearing aid: Secondary | ICD-10-CM | POA: Insufficient documentation

## 2021-11-15 DIAGNOSIS — I700 Atherosclerosis of aorta: Secondary | ICD-10-CM

## 2021-11-15 DIAGNOSIS — I10 Essential (primary) hypertension: Secondary | ICD-10-CM | POA: Diagnosis not present

## 2021-11-15 DIAGNOSIS — F32A Depression, unspecified: Secondary | ICD-10-CM | POA: Insufficient documentation

## 2021-11-15 DIAGNOSIS — R296 Repeated falls: Secondary | ICD-10-CM

## 2021-11-15 DIAGNOSIS — R269 Unspecified abnormalities of gait and mobility: Secondary | ICD-10-CM | POA: Diagnosis not present

## 2021-11-15 DIAGNOSIS — I779 Disorder of arteries and arterioles, unspecified: Secondary | ICD-10-CM

## 2021-11-15 DIAGNOSIS — F419 Anxiety disorder, unspecified: Secondary | ICD-10-CM | POA: Insufficient documentation

## 2021-11-15 DIAGNOSIS — R2689 Other abnormalities of gait and mobility: Secondary | ICD-10-CM | POA: Insufficient documentation

## 2021-11-15 NOTE — Patient Instructions (Signed)
TRY ROBITUSSIN TWICE DAILY.

## 2021-11-15 NOTE — Progress Notes (Signed)
Established patient visit  I,[name] [name],acting as a scribe for [name], MD.,have documented all relevant documentation on the behalf of [name], MD,as directed by  [name], MD while in the presence of [name], MD.    Patient: [name] [name]   DOB: 18-Apr-1934   87 y.o. Male  MRN: [id number] Visit Date: 11/15/2021  Today's healthcare provider: [name], MD   Chief Complaint  Patient presents with   Follow-up   MCI   Subjective    HPI  Patient is brought in by his wife for follow-up.  They have moved out of their house and into an apartment.  He has not injured himself but is ended up on the floor twice in recent times and both times the patient nor the patient with his wife was able to get him up without help. [facility] but the wife stopped it a week ago.  Evidently the 25 mg dose. She is worried because his gait is not steady and she is worried about falls.  He is also having some memory problems. He has had no syncope or presyncope.  No chest pain or shortness of breath.  Occasionally has trouble swallowing foods but it is more that he gets choked.  True dysphagia. Follow up for MCI (mild cognitive impairment):  The patient was last seen for this 5 months ago. Changes made at last visit include; no changes.  He reports good compliance with treatment. He feels that condition is Worse. He is not having side effects. none  ----------------------------------------------------------------------------------------- MMSE - Mini Mental State Exam 11/15/2021 10/24/2020 06/15/2020  Orientation to time 4 5 5   Orientation to Place 4 5 5   Registration 3 3 3   Attention/ Calculation 5 5 5   Recall 3 2 3   Language- name 2 objects 2 2 2   Language- repeat 1 1 1   Language- follow 3 step command 3 3 3   Language- read & follow direction 1 1 1   Write a sentence 1 1 1   Copy design 1 1 1   Total score 28 29 30       Medications: Outpatient Medications Prior to Visit  Medication Sig   alendronate (FOSAMAX) 70 MG tablet Take 1 tablet (70 mg total) by mouth once a week. Take with a full glass of water on an empty stomach.   aspirin EC 81 MG tablet Take 1 tablet (81 mg total) by mouth daily.   atorvastatin (LIPITOR) 40 MG tablet Take 1 tablet by mouth once daily   glucose blood (ONETOUCH ULTRA) test strip USE 1 STRIP TO CHECK GLUCOSE ONCE DAILY   Lancets (ONETOUCH DELICA PLUS [id number]) MISC USE 1  TO CHECK GLUCOSE ONCE DAILY   latanoprost (XALATAN) 0.005 % ophthalmic solution Place 1 drop into both eyes at bedtime.    levothyroxine (SYNTHROID) 50 MCG tablet Take 1 tablet by mouth once daily   meclizine (ANTIVERT) 12.5 MG tablet TAKE 1 TABLET BY MOUTH THREE TIMES DAILY AS NEEDED FOR DIZZINESS   metFORMIN (GLUCOPHAGE) 1000 MG tablet Take 1 tablet by mouth once daily with breakfast   metoprolol tartrate (LOPRESSOR) 25 MG tablet Take 1/2 (one-half) tablet by mouth twice daily. PLEASE CALL OFFICE TO SCHEDULE AN APPOINTMENT FOR FURTHER REFILLS.   ONETOUCH DELICA LANCETS 68L MISC USE ONE LANCET TO CHECK BLOOD GLUCOSE ONCE DAILY   ONETOUCH ULTRA test strip USE 1 STRIP TO CHECK GLUCOSE ONCE DAILY   pantoprazole (PROTONIX) 40  MG tablet Take 1 tablet (40 mg total) by mouth 2 (two) times daily.   warfarin (COUMADIN) 3 MG tablet TAKE AS DIRECTED BY  ANTI-COAG  CLINIC   ALPRAZolam (XANAX) 0.25 MG tablet Take 1 tablet (0.25 mg total) by mouth every 6 (six) hours as needed for anxiety. (Patient not taking: Reported on 06/14/2021)   sertraline (ZOLOFT) 25 MG tablet Take 1 tablet by mouth every morning. (Patient not taking: Reported on 11/15/2021)   No facility-administered medications prior to visit.    Review of Systems  Constitutional:  Negative for appetite change, chills and fever.  Respiratory:  Negative for chest tightness, shortness of breath and wheezing.   Cardiovascular:  Negative for chest pain and  palpitations.  Gastrointestinal:  Negative for abdominal pain, nausea and vomiting.   Last hemoglobin A1c Lab Results  Component Value Date   HGBA1C 6.0 (H) 11/15/2021       Objective    BP (!) 149/70 (BP Location: Right Arm, Patient Position: Sitting, Cuff Size: Normal)    Pulse 64    Temp 98.4 F (36.9 C) (Temporal)    Resp 18    Ht 5\' 9"  (1.753 m)    Wt 166 lb (75.3 kg)    SpO2 98%    BMI 24.51 kg/m  BP Readings from Last 3 Encounters:  11/15/21 (!) 149/70  11/10/21 128/68  08/24/21 139/64   Wt Readings from Last 3 Encounters:  11/15/21 166 lb (75.3 kg)  11/10/21 167 lb 8 oz (76 kg)  08/24/21 175 lb (79.4 kg)      Physical Exam Vitals reviewed.  Constitutional:      Comments: Thin white male who is more  frail than in previous years  HENT:     Head: Normocephalic and atraumatic.     Right Ear: External ear normal.     Left Ear: External ear normal.     Nose: Nose normal.  Eyes:     Conjunctiva/sclera: Conjunctivae normal.  Cardiovascular:     Rate and Rhythm: Normal rate and regular rhythm.     Heart sounds: Normal heart sounds.  Pulmonary:     Effort: Pulmonary effort is normal.     Breath sounds: Normal breath sounds.  Abdominal:     Palpations: Abdomen is soft.  Musculoskeletal:     Right lower leg: No edema.     Left lower leg: No edema.  Skin:    General: Skin is warm and dry.  Neurological:     General: No focal deficit present.     Mental Status: He is alert.     Comments: He walks with a cane slowly but overall his gait is pretty good.  Psychiatric:        Mood and Affect: Mood normal.        Behavior: Behavior normal.      No results found for any visits on 11/15/21.  Assessment & Plan     1. Type 2 diabetes mellitus without complication, without long-term current use of insulin (HCC) This is well controlled with weight loss the patient has had a recent years. - CBC w/Diff/Platelet - Comprehensive Metabolic Panel (CMET) - Hemoglobin  A1c  2. Essential (primary) hypertension  - CBC w/Diff/Platelet - Comprehensive Metabolic Panel (CMET) - Hemoglobin A1c  3. Atrial fibrillation and flutter (HCC)  - CBC w/Diff/Platelet - Comprehensive Metabolic Panel (CMET) - Hemoglobin A1c  4. Atherosclerosis of aorta (HCC)  - CBC w/Diff/Platelet - Comprehensive Metabolic Panel (CMET) - Hemoglobin A1c  5. Carotid artery disease, unspecified laterality, unspecified type ([place]) All risk factors treated - CBC w/Diff/Platelet - Comprehensive Metabolic Panel (CMET) - Hemoglobin A1c  6. MCI (mild cognitive impairment) MMSE today is 28/30 - CBC w/Diff/Platelet - Comprehensive Metabolic Panel (CMET) - Hemoglobin A1c  7. Abnormal gait I think a good bit of this is just failure to thrive and to be 88 years old. Any barium swallow if the occasional choking episodes continue - Ambulatory referral to Physical Therapy  8. Frequent falls Follow-up 6 to 8 weeks. More than 50% OF 30 minute visit spent in counseling or coordination of care  - Ambulatory referral to Physical Therapy   Return in about 2 months (around 01/13/2022).      I, [name], MD, have reviewed all documentation for this visit. The documentation on 11/17/21 for the exam, diagnosis, procedures, and orders are all accurate and complete.    Chrsitopher Wik Cranford Mon, MD  [facility] [phone] (phone) [phone] (fax)  [place]

## 2021-11-16 LAB — CBC WITH DIFFERENTIAL/PLATELET
Basophils Absolute: 0.1 10*3/uL (ref 0.0–0.2)
Basos: 1 %
EOS (ABSOLUTE): 0.4 10*3/uL (ref 0.0–0.4)
Eos: 4 %
Hematocrit: 37 % — ABNORMAL LOW (ref 37.5–51.0)
Hemoglobin: 12.5 g/dL — ABNORMAL LOW (ref 13.0–17.7)
Immature Grans (Abs): 0 10*3/uL (ref 0.0–0.1)
Immature Granulocytes: 0 %
Lymphocytes Absolute: 1.9 10*3/uL (ref 0.7–3.1)
Lymphs: 20 %
MCH: 30.5 pg (ref 26.6–33.0)
MCHC: 33.8 g/dL (ref 31.5–35.7)
MCV: 90 fL (ref 79–97)
Monocytes Absolute: 0.9 10*3/uL (ref 0.1–0.9)
Monocytes: 9 %
Neutrophils Absolute: 6.3 10*3/uL (ref 1.4–7.0)
Neutrophils: 66 %
Platelets: 222 10*3/uL (ref 150–450)
RBC: 4.1 x10E6/uL — ABNORMAL LOW (ref 4.14–5.80)
RDW: 12.6 % (ref 11.6–15.4)
WBC: 9.6 10*3/uL (ref 3.4–10.8)

## 2021-11-16 LAB — COMPREHENSIVE METABOLIC PANEL
ALT: 14 IU/L (ref 0–44)
AST: 25 IU/L (ref 0–40)
Albumin/Globulin Ratio: 1.5 (ref 1.2–2.2)
Albumin: 4.5 g/dL (ref 3.6–4.6)
Alkaline Phosphatase: 105 IU/L (ref 44–121)
BUN/Creatinine Ratio: 18 (ref 10–24)
BUN: 15 mg/dL (ref 8–27)
Bilirubin Total: 0.7 mg/dL (ref 0.0–1.2)
CO2: 26 mmol/L (ref 20–29)
Calcium: 9.8 mg/dL (ref 8.6–10.2)
Chloride: 102 mmol/L (ref 96–106)
Creatinine, Ser: 0.83 mg/dL (ref 0.76–1.27)
Globulin, Total: 3 g/dL (ref 1.5–4.5)
Glucose: 94 mg/dL (ref 70–99)
Potassium: 4.6 mmol/L (ref 3.5–5.2)
Sodium: 140 mmol/L (ref 134–144)
Total Protein: 7.5 g/dL (ref 6.0–8.5)
eGFR: 86 mL/min/{1.73_m2} (ref >59)

## 2021-11-16 LAB — HEMOGLOBIN A1C
Est. average glucose Bld gHb Est-mCnc: 126 mg/dL
Hgb A1c MFr Bld: 6 % — ABNORMAL HIGH (ref 4.8–5.6)

## 2021-11-22 ENCOUNTER — Telehealth: Payer: Self-pay | Admitting: Cardiovascular Disease

## 2021-11-22 MED ORDER — AMOXICILLIN 500 MG PO TABS: 500.0000 mg | ORAL_TABLET | ORAL | 1 refills | Status: DC | PRN

## 2021-11-22 NOTE — Telephone Encounter (Signed)
I s/w dental office and confirmed dental practice. I have confirmed a fax # [phone], Ph [phone]. I did let DDS office know that we have left a message for pt to call back so that we may go over SBE instructions. DDS office aware we will need to call in Amoxicillin 500 mg, take 4 tablets by mouth 30-60 minutes prior to any dental work #4 x 1. Per DDS office the pt said to send in Rx to [facility] on [address] in [place]. I will send in Rx. I will fax clearance notes to requesting office.

## 2021-11-22 NOTE — Telephone Encounter (Addendum)
° °  Patient Name: [name] [name]  DOB: 11-10-1933 MRN: [id number]  Primary Cardiologist: Kathlyn Sacramento, MD  Chart reviewed as part of pre-operative protocol coverage.   Routine dental cleanings and fillings are considered low risk procedures per guidelines and generally do not require any specific cardiac clearance. It is also generally accepted that for this type of procedure, there is no need to interrupt blood thinner therapy.  Regarding SBE prophylaxis, he does require this given his history of valve surgery. Per discussion with his wife [name] [name], there is a vague history of PCN allergy remotely with a rash although she states it actually may never have been PCN, possibly erythromycin instead, since he has an allergy to macrolides in his chart as well. Per her report he has taken amoxicillin multiple times over the last several years as part of SBE prophylaxis without any reaction whatsoever, including as recently as 6 months ago. D/w pharmD. Given tolerance to amoxicillin in the face of several other unclear medication allergies, would continue to recommend amoxicillin.  I tried to call pt/wife back but got VM x 2. LMOM for them to call back at which time will need to clarify which pharmacy he needs his SBE ppx sent into - would suggest amoxicillin 500mg  tablets, take 4 tablets by mouth 30-60 minutes prior to any dental work, disp #4 with 1 refill.   I will route to callback to follow up if patient does not return call, and also clarify whether this recommendation should be sent anywhere (no fax # listed). Otherwise will remove from preop box.   Charlie Pitter, PA-C 11/22/2021, 11:21 AM

## 2021-11-22 NOTE — Telephone Encounter (Signed)
Pt's wife called back and has been advised pt has been cleared and SBE has been sent in. I did confirm [facility] in [place]. Wife aware Rx is for Amoxicillin 500 mg tabs. I went over the full instructions for SBE. Pt's wife thanked me for the help. I will fax these notes to dental office.

## 2021-11-22 NOTE — Telephone Encounter (Signed)
° °  Pre-operative Risk Assessment    Patient Name: [name] [name]  DOB: 04-13-1934 MRN: [id number]     Request for Surgical Clearance    Procedure:   2 fillings no extractions   Date of Surgery:  Clearance 01/05/22                                 Surgeon:  Curly Rim  Surgeon's Group or Practice Name:  same as above  Phone number:  unknown patient wife calling  Fax number: unknown    Type of Clearance Requested:   - Pharmacy:  Hold Aspirin and Warfarin (Coumadin)     Type of Anesthesia:  Not Indicated   Additional requests/questions:  Does this patient need antibiotics?  Patient previously taken amoxicillin and per wife allergic to pcn   Signed, Clarisse Gouge   11/22/2021, 8:26 AM

## 2021-11-24 ENCOUNTER — Other Ambulatory Visit: Payer: Self-pay | Admitting: Family Medicine

## 2021-11-24 DIAGNOSIS — R42 Dizziness and giddiness: Secondary | ICD-10-CM

## 2021-11-25 NOTE — Telephone Encounter (Signed)
Requested medications are due for refill today.  yes  Requested medications are on the active medications list.  yes  Last refill. 08/08/2021 #90 0 refills  Future visit scheduled.   yes  Notes to clinic.  Medication not delegated.    Requested Prescriptions  Pending Prescriptions Disp Refills   meclizine (ANTIVERT) 12.5 MG tablet [Pharmacy Med Name: Meclizine HCl 12.5 MG Oral Tablet] 90 tablet 0    Sig: TAKE 1 TABLET BY MOUTH THREE TIMES DAILY AS NEEDED FOR DIZZINESS     Not Delegated - Gastroenterology: Antiemetics Failed - 11/24/2021  7:34 PM      Failed - This refill cannot be delegated      Passed - Valid encounter within last 6 months    Recent Outpatient Visits           1 week ago Type 2 diabetes mellitus without complication, without long-term current use of insulin [facility])   [facility] [name], MD   4 months ago Need for immunization against influenza   [facility] [place], Dionne Bucy, MD   5 months ago Acute otitis externa of left ear, unspecified type   [facility] [name], MD   5 months ago Type 2 diabetes mellitus without complication, without long-term current use of insulin [facility])   [facility] [name], MD   1 year ago Essential (primary) hypertension   [facility] [name], MD       Future Appointments             In 2 months [name], MD [facility], [place]

## 2021-12-06 ENCOUNTER — Ambulatory Visit (INDEPENDENT_AMBULATORY_CARE_PROVIDER_SITE_OTHER): Payer: PPO

## 2021-12-06 ENCOUNTER — Other Ambulatory Visit: Payer: Self-pay | Admitting: *Deleted

## 2021-12-06 ENCOUNTER — Other Ambulatory Visit: Payer: Self-pay

## 2021-12-06 DIAGNOSIS — I4892 Unspecified atrial flutter: Secondary | ICD-10-CM

## 2021-12-06 DIAGNOSIS — R590 Localized enlarged lymph nodes: Secondary | ICD-10-CM

## 2021-12-06 DIAGNOSIS — I482 Chronic atrial fibrillation, unspecified: Secondary | ICD-10-CM | POA: Diagnosis not present

## 2021-12-06 DIAGNOSIS — I4891 Unspecified atrial fibrillation: Secondary | ICD-10-CM

## 2021-12-06 DIAGNOSIS — Z952 Presence of prosthetic heart valve: Secondary | ICD-10-CM | POA: Diagnosis not present

## 2021-12-06 DIAGNOSIS — Z5181 Encounter for therapeutic drug level monitoring: Secondary | ICD-10-CM

## 2021-12-06 DIAGNOSIS — Z950 Presence of cardiac pacemaker: Secondary | ICD-10-CM

## 2021-12-06 LAB — POCT INR: INR: 3.5 — AB (ref 2.0–3.0)

## 2021-12-06 NOTE — Patient Instructions (Signed)
-   have some greens in the next day or 2 - continue Warfarin 1.5 tablets every day EXCEPT 1 tablet on [place]. - Recheck INR in 5 weeks.

## 2021-12-08 ENCOUNTER — Other Ambulatory Visit: Payer: Self-pay | Admitting: Cardiovascular Disease

## 2021-12-13 ENCOUNTER — Other Ambulatory Visit: Payer: Self-pay

## 2021-12-13 ENCOUNTER — Ambulatory Visit
Admission: RE | Admit: 2021-12-13 | Discharge: 2021-12-13 | Disposition: A | Discharge status: Home / Self Care | Payer: PPO | Source: Ambulatory Visit | Attending: Internal Medicine | Admitting: Internal Medicine

## 2021-12-13 DIAGNOSIS — R590 Localized enlarged lymph nodes: Secondary | ICD-10-CM | POA: Insufficient documentation

## 2021-12-13 MED ORDER — IOHEXOL 300 MG/ML  SOLN
75.0000 mL | Freq: Once | INTRAMUSCULAR | Status: AC | PRN
  Administered 2021-12-13: 75 mL via INTRAVENOUS

## 2021-12-14 ENCOUNTER — Encounter: Payer: Self-pay | Admitting: Internal Medicine

## 2021-12-14 ENCOUNTER — Telehealth: Payer: Self-pay | Admitting: *Deleted

## 2021-12-14 NOTE — Telephone Encounter (Signed)
Patient has an appt with Dr. B tomorrow (12/15/21) ?

## 2021-12-14 NOTE — Telephone Encounter (Signed)
Called report ? ?IMPRESSION: ?1. Increasingly solid appearance of an area within geographic ?ground-glass changes in the LEFT upper lobe, subtle and slow over ?time but appearing more bandlike on coronal images. Slowly ?developing bronchogenic neoplasm could have this appearance. ?Referral to multi disciplinary oncologic setting may be helpful with ?PET scan for further evaluation based on size of "solid component". ?2. Suspicion for LEFT atrial thrombus within the LEFT atrial ?appendage, similar appearance on studies dating back to February of ?2020 in this patient with atrial fibrillation post valve ?replacement. Correlate with prior echocardiography if available or ?follow-up echocardiography as warranted. ?3. Increased loss of height with near complete loss of height at L1 ?and mild retropulsion of posterior cortex at L1 since previous ?imaging from July of 2022. Correlate with any increasing pain. ?Degenerative changes surrounding this area favor chronic but ?interval finding. ?4. Interval loss of height also at T1. ?5. Hepatic cirrhosis. ?6. Stable appearance of additional small pulmonary nodules ?compatible with benign findings elsewhere in the chest. ?7. Aortic atherosclerosis. ?  ?Aortic Atherosclerosis (ICD10-I70.0). ?  ?These results will be called to the ordering clinician or ?representative by the Radiologist Assistant, and communication ?documented in the PACS or [employer]. ?  ?  ?Electronically Signed ?  By: Zetta Bills M.D. ?  On: 12/14/2021 13:23 ?  ?

## 2021-12-15 ENCOUNTER — Other Ambulatory Visit: Payer: Self-pay

## 2021-12-15 ENCOUNTER — Inpatient Hospital Stay (HOSPITAL_BASED_OUTPATIENT_CLINIC_OR_DEPARTMENT_OTHER): Payer: PPO | Admitting: Internal Medicine

## 2021-12-15 ENCOUNTER — Inpatient Hospital Stay: Payer: PPO | Attending: Internal Medicine

## 2021-12-15 ENCOUNTER — Telehealth: Payer: Self-pay | Admitting: Cardiovascular Disease

## 2021-12-15 VITALS — BP 128/56 | HR 70 | Temp 98.2°F | Ht 69.0 in | Wt 161.4 lb

## 2021-12-15 DIAGNOSIS — R911 Solitary pulmonary nodule: Secondary | ICD-10-CM

## 2021-12-15 DIAGNOSIS — Z7984 Long term (current) use of oral hypoglycemic drugs: Secondary | ICD-10-CM | POA: Diagnosis not present

## 2021-12-15 DIAGNOSIS — K7460 Unspecified cirrhosis of liver: Secondary | ICD-10-CM | POA: Diagnosis not present

## 2021-12-15 DIAGNOSIS — Z7901 Long term (current) use of anticoagulants: Secondary | ICD-10-CM | POA: Insufficient documentation

## 2021-12-15 DIAGNOSIS — D649 Anemia, unspecified: Secondary | ICD-10-CM | POA: Diagnosis not present

## 2021-12-15 DIAGNOSIS — R590 Localized enlarged lymph nodes: Secondary | ICD-10-CM

## 2021-12-15 DIAGNOSIS — I513 Intracardiac thrombosis, not elsewhere classified: Secondary | ICD-10-CM

## 2021-12-15 DIAGNOSIS — Z79899 Other long term (current) drug therapy: Secondary | ICD-10-CM | POA: Insufficient documentation

## 2021-12-15 DIAGNOSIS — Z7982 Long term (current) use of aspirin: Secondary | ICD-10-CM | POA: Diagnosis not present

## 2021-12-15 DIAGNOSIS — I4891 Unspecified atrial fibrillation: Secondary | ICD-10-CM | POA: Insufficient documentation

## 2021-12-15 DIAGNOSIS — Z952 Presence of prosthetic heart valve: Secondary | ICD-10-CM | POA: Diagnosis not present

## 2021-12-15 DIAGNOSIS — E1140 Type 2 diabetes mellitus with diabetic neuropathy, unspecified: Secondary | ICD-10-CM | POA: Insufficient documentation

## 2021-12-15 DIAGNOSIS — F17210 Nicotine dependence, cigarettes, uncomplicated: Secondary | ICD-10-CM | POA: Insufficient documentation

## 2021-12-15 DIAGNOSIS — Z8673 Personal history of transient ischemic attack (TIA), and cerebral infarction without residual deficits: Secondary | ICD-10-CM | POA: Diagnosis not present

## 2021-12-15 DIAGNOSIS — I700 Atherosclerosis of aorta: Secondary | ICD-10-CM | POA: Insufficient documentation

## 2021-12-15 DIAGNOSIS — C641 Malignant neoplasm of right kidney, except renal pelvis: Secondary | ICD-10-CM | POA: Diagnosis present

## 2021-12-15 LAB — CBC WITH DIFFERENTIAL/PLATELET
Abs Immature Granulocytes: 0.03 10*3/uL (ref 0.00–0.07)
Basophils Absolute: 0.1 10*3/uL (ref 0.0–0.1)
Basophils Relative: 1 %
Eosinophils Absolute: 0.3 10*3/uL (ref 0.0–0.5)
Eosinophils Relative: 4 %
HCT: 39.6 % (ref 39.0–52.0)
Hemoglobin: 12.9 g/dL — ABNORMAL LOW (ref 13.0–17.0)
Immature Granulocytes: 0 %
Lymphocytes Relative: 26 %
Lymphs Abs: 1.8 10*3/uL (ref 0.7–4.0)
MCH: 30.9 pg (ref 26.0–34.0)
MCHC: 32.6 g/dL (ref 30.0–36.0)
MCV: 94.7 fL (ref 80.0–100.0)
Monocytes Absolute: 0.7 10*3/uL (ref 0.1–1.0)
Monocytes Relative: 9 %
Neutro Abs: 4.3 10*3/uL (ref 1.7–7.7)
Neutrophils Relative %: 60 %
Platelets: 194 10*3/uL (ref 150–400)
RBC: 4.18 MIL/uL — ABNORMAL LOW (ref 4.22–5.81)
RDW: 13.5 % (ref 11.5–15.5)
WBC: 7.2 10*3/uL (ref 4.0–10.5)
nRBC: 0 % (ref 0.0–0.2)

## 2021-12-15 LAB — COMPREHENSIVE METABOLIC PANEL
ALT: 15 U/L (ref 0–44)
AST: 26 U/L (ref 15–41)
Albumin: 4 g/dL (ref 3.5–5.0)
Alkaline Phosphatase: 75 U/L (ref 38–126)
Anion gap: 7 (ref 5–15)
BUN: 15 mg/dL (ref 8–23)
CO2: 27 mmol/L (ref 22–32)
Calcium: 9.1 mg/dL (ref 8.9–10.3)
Chloride: 102 mmol/L (ref 98–111)
Creatinine, Ser: 0.91 mg/dL (ref 0.61–1.24)
GFR, Estimated: >60 mL/min (ref >60)
Glucose, Bld: 183 mg/dL — ABNORMAL HIGH (ref 70–99)
Potassium: 3.9 mmol/L (ref 3.5–5.1)
Sodium: 136 mmol/L (ref 135–145)
Total Bilirubin: 0.4 mg/dL (ref 0.3–1.2)
Total Protein: 8 g/dL (ref 6.5–8.1)

## 2021-12-15 LAB — LACTATE DEHYDROGENASE: LDH: 239 U/L — ABNORMAL HIGH (ref 98–192)

## 2021-12-15 NOTE — Assessment & Plan Note (Addendum)
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 [Since December 2018]. CT scan FEB 2023 [compared to Feb 2022]-left upper lobe- increasingly solid appearance of an area within geographic ?ground-glass changes- ?  Slow-growing bronchogenic neoplasm.  Recommend a PET scan for further evaluation.  We also discussed the tumor conference.  I again reassured the patient/wife that this new lesion does not seem to be life-threatening in the context of patient's age and multiple comorbidities.  ? ?#History of A-fib on-Coumadin/valve- [Dr.Arida]-incidental left atrial thrombus noted on CT imaging; discussed with Dr. Fletcher Anon; awaiting 2D echo.  ? ?# Anemia/Cirhosis- 12.9 ? Etiology [hx Bleeding ulcer]- STABLE [Dr.Wohl s/p scopes]- ?  ?# RCC s/p Right ablation-2001.  August 2021-CT scan partial evaluation; hold off imaging at this time/ Dr.Stoioff-  ? ?# Incidental findings on Imaging 3/02, 2023: LEFT atrial thrombus within the LEFT atrial appendage; Interval loss of height at T1; Hepatic cirrhosis; Aortic atherosclerosis;.I reviewed/discussed/counseled the patient.  ? ?# DISPOSITION: ?# PET scan ASAP ?#  Follow up TBD-Dr.B ? ?# I reviewed the blood work- with the patient in detail; also reviewed the imaging independently [as summarized above]; and with the patient in detail.  ? ?Cc; Dr.Gilbert ?[name] ?

## 2021-12-15 NOTE — Progress Notes (Signed)
Fell 3 weeks ago and broke L1 vertebrae. ? ?Pt has concerns of having c-diff, he has diarrhea from time to time. ?

## 2021-12-15 NOTE — Telephone Encounter (Signed)
Per Dr. Tyrell Antonio and Dr. Rogue Bussing secure chat ? ?"Lattie Haw, please schedule an echo for left atrial thrombus". ? ?Order placed for echo. Msg fwd to scheduling to call the patient to schedule. ?

## 2021-12-15 NOTE — Progress Notes (Signed)
[place] NOTE  Patient Care Team: [name], MD as PCP - General (Unknown Physician Specialty) Wellington Hampshire, MD as PCP - Cardiology (Cardiology) Margaretha Sheffield, MD (Otolaryngology) Leandrew Koyanagi, MD as Referring Physician (Ophthalmology) Sharlotte Alamo, DPM as Consulting Physician (Podiatry) Bernardo Heater, Ronda Fairly, MD as Consulting Physician (Urology) Ree Edman, MD as Consulting Physician (Dermatology) Telford Nab, RN as Registered Nurse [name], MD as Consulting Physician (Gastroenterology) Cockfield, Deanne Coffer, PA-C (Cardiology)  CHIEF COMPLAINTS/PURPOSE OF CONSULTATION:  Mediastinal adenopathy  #  Oncology History Overview Note  #February 2020 gastroenteropathy/right supraclavicular adenopathy-incidental]-PET scan 2020-low-grade FDG activity-differential low-grade lymphoma versus benign reactive process. no biopsy  #Right kidney-cancer status post cryo [[facility]]  # 2020- colonoscopy s/p polypectomy  [Dr.Wohl-]  #CAD status post CABG; mechanical valve replacement [2019 August; [facility]]; on Coumadin     Adenocarcinoma, renal cell ([place])  06/13/2015 Initial Diagnosis   Adenocarcinoma, renal cell ([place])   Cancer of right kidney ([place])  12/10/2018 Initial Diagnosis   Cancer of right kidney ([place])     HISTORY OF PRESENTING ILLNESS: Accompanied by his wife. He is walking with a cane.    [name] [name] 88 y.o.  male is multiple co-morbidities- a.fb/mechanical valve on coumadin; compensated cirrhosis here for follow-up  re:of mediastinal/neck adenopathy/anemia review the results of the CT scan.  Patient had recent fall mechanical.  No significant trauma.  Continues to have memory issues.  No new shortness of breath or cough.  No weight loss.  Review of Systems  Constitutional:  Positive for malaise/fatigue and weight loss. Negative for chills, diaphoresis and fever.  HENT:  Negative for nosebleeds and sore throat.   Eyes:   Negative for double vision.  Respiratory:  Negative for cough, hemoptysis, sputum production, shortness of breath and wheezing.   Cardiovascular:  Negative for chest pain, palpitations, orthopnea and leg swelling.  Gastrointestinal:  Negative for abdominal pain, blood in stool, constipation, diarrhea, heartburn, melena, nausea and vomiting.  Genitourinary:  Negative for dysuria, frequency and urgency.  Musculoskeletal:  Negative for back pain and joint pain.  Skin: Negative.  Negative for itching and rash.  Neurological:  Positive for dizziness. Negative for tingling, focal weakness, weakness and headaches.  Endo/Heme/Allergies:  Does not bruise/bleed easily.  Psychiatric/Behavioral:  Negative for depression. The patient is not nervous/anxious and does not have insomnia.     MEDICAL HISTORY:  Past Medical History:  Diagnosis Date   Aortic stenosis    a. 01/2016 echo: mod AS; b. 04/2018 Cardiac MRI: sev AS; c. 05/2018 s/p mech AVR @ time of myomectomy; d. 07/2018 Echo: Mild AS/AI.   Atherosclerosis of aorta ([place])    by CT scan in past   Atrial fibrillation (HCC)    and aflutter. pt has a left atrial circuit that is not ablated. was on amiodarone-stopped, now use rate control.    Bladder cancer ([place])    hx; treated with BCG in past    Carotid artery disease ([place])    There was calcified plaque but no stenosis by carotid artery screening done at [facility] October, 2013   CHB (complete heart block) [facility])    a. 05/2018 s/p BSX VVIR PPM ([facility]) in setting of bradycardia following myomectomy and AVR/MVR.   Diabetes mellitus    type not specified   Diabetic neuropathy (HCC)    feet   Elevated liver enzymes    over time; hx   Excessive sweating    Glaucoma    Gout  Head injury    when slipped on ice 2004-2005. stabilized and back on Coumadin    Headache    migraines - distant past   History of cardiac cath    a. 05/2018 Cath ([facility]): Non-obstructive CAD. Mildly elevated  filling pressures w/ nl CI.   HOCM (hypertrophic obstructive cardiomyopathy) ([place])    a. 01/2016 Echo: EF 65-70%, no rwma, LVOT gradient of 80-78mmHg, mod AS, SAM; b. 11/2017 Echo: EF 55-60%, near cavity obliteration in systole, mod AS, mild MS; c. 04/2018 Card MRI ([facility]): Sev LVH, EF >70%, Sev AS, LVOT obs w/ Sev MR, mild to mod TR/PR, mid-myocardial HE in basal-mid anteroseptum and inferoseptum; d. 05/2018 s/p Septal myomectomy; e. 07/2018 Echo: EF 60-65%. PASP 52mmHg.   Homocystinemia ([place])    elevated, mild    Hypercholesterolemia    treated.    Hypertension    Infection of right inner ear    Kidney mass    a. s/p laproscopic surgery woth cryoablation of a mass outside kidney followed at [facility]; b. 11/2018 CT Chest: 2cm R kidney mass.   Mediastinal adenopathy    a. 12/208 CT Chest: up to 80mm LLL lung nodule. 1.5-1.7cm subcarinal/mediastinal adenopahty/right paratracheal lymph node; b. 11/2018 CT Chest: 2.5-3cm mediastinal adenopathy.   Mitral regurgitation    a. 04/2018 Cardiac MRI: sev MR; b. 05/2018 s/p mech MVR @ time of myomectomy; c. 07/2018 Echo: Mild MS.   Motion sickness    ocean boats   Nausea    Nonobstructive coronary artery disease    a. 05/2018 Cath ([facility]): nonobs CAD.   Obstructive sleep apnea    CPAP started successfully 2014   Orthostatic hypotension    a. orthostatic. dehydration. hospitalized 11/11   Sleep apnea    Significant obstructive sleep apnea diagnosed in August, 2012, the patient is to receive CPAP    Stroke [facility])    "2 old strokes" CT and MRI. [place] [facility] 11/11. diagnosis was dehydration, no acutal reports.    TSH elevation    on synthroid historically    Unsteady gait    August, 2012    SURGICAL HISTORY: Past Surgical History:  Procedure Laterality Date   BLADDER SURGERY     CATARACT EXTRACTION W/PHACO Left 05/11/2015   Procedure: CATARACT EXTRACTION PHACO AND INTRAOCULAR LENS PLACEMENT ([place]);  Surgeon: Leandrew Koyanagi, MD;  Location: [facility];  Service: Ophthalmology;  Laterality: Left;  DIABETIC - oral meds, CPAP   COLONOSCOPY WITH PROPOFOL N/A 04/14/2019   Procedure: COLONOSCOPY WITH PROPOFOL;  Surgeon: [name], MD;  Location: [facility] ENDOSCOPY;  Service: Endoscopy;  Laterality: N/A;   ESOPHAGOGASTRODUODENOSCOPY (EGD) WITH PROPOFOL N/A 07/01/2018   Procedure: ESOPHAGOGASTRODUODENOSCOPY (EGD) WITH PROPOFOL;  Surgeon: [name], MD;  Location: [facility] ENDOSCOPY;  Service: Endoscopy;  Laterality: N/A;   INSERT / REPLACE / [place]     "froze mass"   MECHANICAL AORTIC AND MITRAL VALVE REPLACEMENT  05/2018   [facility]    TONSILLECTOMY     VALVE REPLACEMENT      SOCIAL HISTORY: Social History   Socioeconomic History   Marital status: Married    Spouse name: Jasmine Awe   Number of children: 1   Years of education: 16   Highest education level: Bachelor's degree (e.g., BA, AB, BS)  Occupational History   Occupation: retired  Tobacco Use   Smoking status: Former    Types: Cigars, Cigarettes    Quit date: 10/02/1974  Years since quitting: 77.2   Smokeless tobacco: Never   Tobacco comments:    still smokes cigars once a day  Vaping Use   Vaping Use: Never used  Substance and Sexual Activity   Alcohol use: No   Drug use: No   Sexual activity: Yes  Other Topics Concern   Not on file  Social History Narrative   Married, retired, gets regular exercise.    Social Determinants of Health   Financial Resource Strain: Low Risk    Difficulty of Paying Living Expenses: Not hard at all  Food Insecurity: No Food Insecurity   Worried About Charity fundraiser in the Last Year: Never true   [place] in the Last Year: Never true  Transportation Needs: No Transportation Needs   Lack of Transportation (Medical): No   Lack of Transportation (Non-Medical): No  Physical Activity: Insufficiently Active   Days of Exercise per Week: 4 days   Minutes of Exercise per  Session: 10 min  Stress: No Stress Concern Present   Feeling of Stress : Only a little  Social Connections: Engineer, building services of Communication with Friends and Family: Twice a week   Frequency of Social Gatherings with Friends and Family: Once a week   Attends Religious Services: More than 4 times per year   Active Member of [employer] or Organizations: Yes   Attends Music therapist: More than 4 times per year   Marital Status: Married  Human resources officer Violence: Not At Risk   Fear of Current or Ex-Partner: No   Emotionally Abused: No   Physically Abused: No   Sexually Abused: No    FAMILY HISTORY: Family History  Problem Relation Age of Onset   Arrhythmia Father        A-Fib   Prostate cancer Father    Stroke Father    Breast cancer Mother    Arrhythmia Brother        A-Fib   Heart attack Neg Hx    Hypertension Neg Hx     ALLERGIES:  is allergic to macrolides and ketolides, mycinettes, nitrofuran derivatives, nitrofurantoin, penicillins, erythromycin, zofran [ondansetron hcl-dextrose], and zofran [ondansetron hcl].  MEDICATIONS:  Current Outpatient Medications  Medication Sig Dispense Refill   alendronate (FOSAMAX) 70 MG tablet Take 1 tablet (70 mg total) by mouth once a week. Take with a full glass of water on an empty stomach. 4 tablet 11   ALPRAZolam (XANAX) 0.25 MG tablet Take 1 tablet (0.25 mg total) by mouth every 6 (six) hours as needed for anxiety. 60 tablet 0   amoxicillin (AMOXIL) 500 MG tablet Take 1 tablet (500 mg total) by mouth as needed (TAKE AS NEEDED FOR ANY DENTAL PROCEDURES). Take 4 tablets 30-60 minutes prior to any dental work 4 tablet 1   aspirin EC 81 MG tablet Take 1 tablet (81 mg total) by mouth daily.     atorvastatin (LIPITOR) 40 MG tablet Take 1 tablet by mouth once daily 90 tablet 0   glucose blood (ONETOUCH ULTRA) test strip USE 1 STRIP TO CHECK GLUCOSE ONCE DAILY 50 each 0   Lancets (ONETOUCH DELICA PLUS OINOMV67M) MISC  USE 1  TO CHECK GLUCOSE ONCE DAILY 100 each 6   latanoprost (XALATAN) 0.005 % ophthalmic solution Place 1 drop into both eyes at bedtime.      levothyroxine (SYNTHROID) 50 MCG tablet Take 1 tablet by mouth once daily 90 tablet 3   meclizine (ANTIVERT) 12.5 MG tablet  TAKE 1 TABLET BY MOUTH THREE TIMES DAILY AS NEEDED FOR DIZZINESS 90 tablet 0   metFORMIN (GLUCOPHAGE) 1000 MG tablet Take 1 tablet by mouth once daily with breakfast 90 tablet 3   metoprolol tartrate (LOPRESSOR) 25 MG tablet TAKE 1/2 (ONE-HALF) TABLET BY MOUTH TWICE DAILY. 90 tablet 2   ONETOUCH DELICA LANCETS 71I MISC USE ONE LANCET TO CHECK BLOOD GLUCOSE ONCE DAILY 100 each 12   ONETOUCH ULTRA test strip USE 1 STRIP TO CHECK GLUCOSE ONCE DAILY 50 each 0   pantoprazole (PROTONIX) 40 MG tablet Take 1 tablet (40 mg total) by mouth 2 (two) times daily. 180 tablet 2   warfarin (COUMADIN) 3 MG tablet TAKE AS DIRECTED BY  ANTI-COAG  CLINIC 60 tablet 3   sertraline (ZOLOFT) 25 MG tablet Take 1 tablet by mouth every morning. (Patient not taking: Reported on 11/15/2021)     No current facility-administered medications for this visit.      [name]  PHYSICAL EXAMINATION: ECOG PERFORMANCE STATUS: 0 - Asymptomatic  Vitals:   12/15/21 1354  BP: (!) 128/56  Pulse: 70  Temp: 98.2 F (36.8 C)  SpO2: 98%   Filed Weights   12/15/21 1354  Weight: 161 lb 6.4 oz (73.2 kg)    Physical Exam HENT:     Head: Normocephalic and atraumatic.     Mouth/Throat:     Pharynx: No oropharyngeal exudate.  Eyes:     Pupils: Pupils are equal, round, and reactive to light.  Cardiovascular:     Rate and Rhythm: Normal rate and regular rhythm.  Pulmonary:     Effort: Pulmonary effort is normal. No respiratory distress.     Breath sounds: Normal breath sounds. No wheezing.  Abdominal:     General: Bowel sounds are normal. There is no distension.     Palpations: Abdomen is soft. There is no mass.     Tenderness: There is no abdominal tenderness. There is  no guarding or rebound.  Musculoskeletal:        General: No tenderness. Normal range of motion.     Cervical back: Normal range of motion and neck supple.  Skin:    General: Skin is warm.  Neurological:     Mental Status: He is alert and oriented to person, place, and time.  Psychiatric:        Mood and Affect: Affect normal.     LABORATORY DATA:  I have reviewed the data as listed Lab Results  Component Value Date   WBC 7.2 12/15/2021   HGB 12.9 (L) 12/15/2021   HCT 39.6 12/15/2021   MCV 94.7 12/15/2021   PLT 194 12/15/2021   Recent Labs    06/14/21 1244 08/09/21 1841 11/15/21 1521 12/15/21 1322  NA 136 138 140 136  K 3.7 4.3 4.6 3.9  CL 103 104 102 102  CO2 25 27 26 27   GLUCOSE 173* 98 94 183*  BUN 14 14 15 15   CREATININE 0.83 0.84 0.83 0.91  CALCIUM 8.7* 8.8* 9.8 9.1  GFRNONAA >60 >60  --  >60  PROT 7.2  --  7.5 8.0  ALBUMIN 3.8  --  4.5 4.0  AST 28  --  25 26  ALT 17  --  14 15  ALKPHOS 77  --  105 75  BILITOT 0.8  --  0.7 0.4    RADIOGRAPHIC STUDIES: I have personally reviewed the radiological images as listed and agreed with the findings in the report. CT CHEST W CONTRAST  Result Date: 12/14/2021 CLINICAL DATA:  Mediastinal mass and mediastinal adenopathy in an 88 year old male. EXAM: CT CHEST WITH CONTRAST TECHNIQUE: Multidetector CT imaging of the chest was performed during intravenous contrast administration. RADIATION DOSE REDUCTION: This exam was performed according to the departmental dose-optimization program which includes automated exposure control, adjustment of the mA and/or kV according to patient size and/or use of iterative reconstruction technique. CONTRAST:  66mL OMNIPAQUE IOHEXOL 300 MG/ML  SOLN COMPARISON:  February of 2022. FINDINGS: Cardiovascular: Calcified and noncalcified atheromatous plaque in the thoracic aorta. Post aortic and mitral valve replacement. Stable mild to moderate cardiac enlargement but with substantial enlargement of the  LEFT atrium not changed since previous imaging. Low-attenuation in the LEFT atrial appendage near the tip of the LEFT atrial appendage with similar appearance to studies dating back to February of 2020, consistent across all imaging evaluations. Mediastinum/Nodes: Enlarged lymph nodes in the chest show decreased in size when compared to examinations dating back to 2020 and are stable with respect to imaging from February of 2022, largest is along the RIGHT paratracheal chain measuring approximately 16 mm. Pre-vascular and AP window lymph nodes just at a cm without change as well. No thoracic inlet adenopathy. No axillary lymphadenopathy. Mild subcarinal nodal enlargement, 12 mm greatest short axis dimension without change. No hilar adenopathy. Esophagus grossly normal. Lungs/Pleura: Small nodule along the pleural surface in the RIGHT upper lobe (image 59/3) 4 mm, this is unchanged. No sign of pleural effusion or consolidative change. Subtle pleural nodularity in the RIGHT chest measuring approximately 3 mm greatest thickness (image 91/3) not changed over greater than 1 year interval since February 2021. Geographic ground-glass attenuation in the anterior LEFT upper lobe with more central area of increasingly solid nodularity (image 70/3) this area measuring 10 x 5 mm in the axial plane was previously sub solid. Small nodules elsewhere in the chest without change. Airways are patent. Upper Abdomen: Signs of cirrhosis with nodular hepatic contour showing caudate enlargement. No pericholecystic stranding. Limited assessment of the liver due to phase of contrast enhancement. No acute findings relative to visualized pancreas, spleen or adrenal glands. Unchanged appearance of a peripherally calcified LEFT renal lesion since February of 2022. Musculoskeletal: Further loss of height at the L1 level, near complete loss of height anteriorly and mild retropulsion of posterior cortical elements at the L1 level approximately 3-4  mm retropulsion. Increased loss of height noted since April 24, 2021. Surrounding degenerative change with signs of sclerosis at this location. New mild compression of the T1 vertebral body superior endplate approximately 97% loss of height since February 2022. IMPRESSION: 1. Increasingly solid appearance of an area within geographic ground-glass changes in the LEFT upper lobe, subtle and slow over time but appearing more bandlike on coronal images. Slowly developing bronchogenic neoplasm could have this appearance. Referral to multi disciplinary oncologic setting may be helpful with PET scan for further evaluation based on size of "solid component". 2. Suspicion for LEFT atrial thrombus within the LEFT atrial appendage, similar appearance on studies dating back to February of 2020 in this patient with atrial fibrillation post valve replacement. Correlate with prior echocardiography if available or follow-up echocardiography as warranted. 3. Increased loss of height with near complete loss of height at L1 and mild retropulsion of posterior cortex at L1 since previous imaging from July of 2022. Correlate with any increasing pain. Degenerative changes surrounding this area favor chronic but interval finding. 4. Interval loss of height also at T1. 5. Hepatic cirrhosis. 6. Stable appearance  of additional small pulmonary nodules compatible with benign findings elsewhere in the chest. 7. Aortic atherosclerosis. Aortic Atherosclerosis (ICD10-I70.0). These results will be called to the ordering clinician or representative by the Radiologist Assistant, and communication documented in the PACS or [employer]. Electronically Signed   By: Zetta Bills M.D.   On: 12/14/2021 13:23     ASSESSMENT & PLAN:   Mediastinal lymphadenopathy # Mediastinal Right paratracheal  LN/right supraclavicular adenopathy 1-2 centimeters lymph node. [Since December 2018]. CT scan FEB 2023 [compared to Feb 2022]-left upper lobe-  increasingly solid appearance of an area within geographic ground-glass changes- ?  Slow-growing bronchogenic neoplasm.  Recommend a PET scan for further evaluation.  We also discussed the tumor conference.  I again reassured the patient/wife that this new lesion does not seem to be life-threatening in the context of patient's age and multiple comorbidities.   #History of A-fib on-Coumadin/valve- [Dr.Arida]-incidental left atrial thrombus noted on CT imaging; discussed with Dr. Fletcher Anon; awaiting 2D echo.   # Anemia/Cirhosis- 12.9 ? Etiology [hx Bleeding ulcer]- STABLE [Dr.Wohl s/p scopes]-   # RCC s/p Right ablation-2001.  August 2021-CT scan partial evaluation; hold off imaging at this time/ Dr.Stoioff-   # Incidental findings on Imaging 3/02, 2023: LEFT atrial thrombus within the LEFT atrial appendage; Interval loss of height at T1; Hepatic cirrhosis; Aortic atherosclerosis;.I reviewed/discussed/counseled the patient.   # DISPOSITION: # PET scan ASAP #  Follow up TBD-Dr.B  # I reviewed the blood work- with the patient in detail; also reviewed the imaging independently [as summarized above]; and with the patient in detail.   Cc; Dr.Gilbert .  All questions were answered. The patient knows to call the clinic with any problems, questions or concerns.    Cammie Sickle, MD 12/15/2021 5:10 PM

## 2021-12-18 ENCOUNTER — Ambulatory Visit (INDEPENDENT_AMBULATORY_CARE_PROVIDER_SITE_OTHER): Payer: PPO

## 2021-12-18 ENCOUNTER — Other Ambulatory Visit: Payer: Self-pay

## 2021-12-18 ENCOUNTER — Telehealth: Payer: Self-pay | Admitting: *Deleted

## 2021-12-18 DIAGNOSIS — I513 Intracardiac thrombosis, not elsewhere classified: Secondary | ICD-10-CM

## 2021-12-18 LAB — ECHOCARDIOGRAM COMPLETE
AR max vel: 1.59 cm2
AV Area VTI: 1.68 cm2
AV Area mean vel: 1.49 cm2
AV Mean grad: 8 mmHg
AV Peak grad: 14.7 mmHg
Ao pk vel: 1.92 m/s
MV VTI: 1.63 cm2
S' Lateral: 2.1 cm

## 2021-12-18 MED ORDER — PERFLUTREN LIPID MICROSPHERE
1.0000 mL | INTRAVENOUS | Status: AC | PRN
  Administered 2021-12-18: 2 mL via INTRAVENOUS

## 2021-12-18 NOTE — Telephone Encounter (Signed)
Patient stepdaughter called requesting a return call from Dr B regarding the CT results. She states patient saw Dr Fletcher Anon today and had a echo done and is awaiting results of that but wants to discuss other finding with Dr B Please return her call, she states she has sent a MyChart message as well ? ?IMPRESSION: ?1. Increasingly solid appearance of an area within geographic ?ground-glass changes in the LEFT upper lobe, subtle and slow over ?time but appearing more bandlike on coronal images. Slowly ?developing bronchogenic neoplasm could have this appearance. ?Referral to multi disciplinary oncologic setting may be helpful with ?PET scan for further evaluation based on size of "solid component". ?2. Suspicion for LEFT atrial thrombus within the LEFT atrial ?appendage, similar appearance on studies dating back to February of ?2020 in this patient with atrial fibrillation post valve ?replacement. Correlate with prior echocardiography if available or ?follow-up echocardiography as warranted. ?3. Increased loss of height with near complete loss of height at L1 ?and mild retropulsion of posterior cortex at L1 since previous ?imaging from July of 2022. Correlate with any increasing pain. ?Degenerative changes surrounding this area favor chronic but ?interval finding. ?4. Interval loss of height also at T1. ?5. Hepatic cirrhosis. ?6. Stable appearance of additional small pulmonary nodules ?compatible with benign findings elsewhere in the chest. ?7. Aortic atherosclerosis. ?  ?Aortic Atherosclerosis (ICD10-I70.0). ?  ?These results will be called to the ordering clinician or ?representative by the Radiologist Assistant, and communication ?documented in the PACS or [employer]. ?  ?  ?Electronically Signed ?  By: Zetta Bills M.D. ?  On: 12/14/2021 13:23 ?

## 2021-12-18 NOTE — Progress Notes (Signed)
Spoke to patient?s daughter regarding the plan for pet scan, and further management based upon the results. They are in agreement.

## 2021-12-18 NOTE — Telephone Encounter (Addendum)
Noted  

## 2021-12-18 NOTE — Telephone Encounter (Signed)
Patient stepdaughter Suanne Marker calling  ?Would like ECHO results to be called to her  ?She is worried patient and patient spouse may not fully understand results  ?

## 2021-12-19 ENCOUNTER — Encounter: Payer: Self-pay | Admitting: Cardiovascular Disease

## 2021-12-19 NOTE — Telephone Encounter (Signed)
Patient daughter calling to check on status  ?

## 2021-12-20 ENCOUNTER — Encounter: Payer: Self-pay | Admitting: Cardiovascular Disease

## 2021-12-20 ENCOUNTER — Telehealth: Payer: Self-pay | Admitting: *Deleted

## 2021-12-20 NOTE — Telephone Encounter (Signed)
Left voicemail message to call back for review of results.  

## 2021-12-20 NOTE — Telephone Encounter (Signed)
-----   Message from Wellington Hampshire, MD sent at 12/19/2021  5:38 PM EST ----- ?Inform patient that echo was fine.  Normal ejection fraction and normal mechanical mitral valve.  No evidence of intracardiac clot. ? ?

## 2021-12-20 NOTE — Telephone Encounter (Signed)
Reviewed results with patients wife per release form. She verbalized understanding with no further questions at this time.  ?

## 2021-12-20 NOTE — Telephone Encounter (Signed)
See telephone and results encounter. Results reviewed with patients wife per release form.  ?

## 2022-01-01 ENCOUNTER — Ambulatory Visit (HOSPITAL_COMMUNITY)
Admission: RE | Admit: 2022-01-01 | Discharge: 2022-01-01 | Disposition: A | Discharge status: Home / Self Care | Payer: PPO | Source: Ambulatory Visit | Attending: Internal Medicine | Admitting: Internal Medicine

## 2022-01-01 ENCOUNTER — Other Ambulatory Visit: Payer: Self-pay

## 2022-01-01 DIAGNOSIS — R590 Localized enlarged lymph nodes: Secondary | ICD-10-CM | POA: Insufficient documentation

## 2022-01-01 DIAGNOSIS — R911 Solitary pulmonary nodule: Secondary | ICD-10-CM | POA: Diagnosis not present

## 2022-01-01 DIAGNOSIS — I700 Atherosclerosis of aorta: Secondary | ICD-10-CM | POA: Insufficient documentation

## 2022-01-01 DIAGNOSIS — K7460 Unspecified cirrhosis of liver: Secondary | ICD-10-CM | POA: Diagnosis not present

## 2022-01-01 LAB — GLUCOSE, CAPILLARY: Glucose-Capillary: 121 mg/dL — ABNORMAL HIGH (ref 70–99)

## 2022-01-01 MED ORDER — FLUDEOXYGLUCOSE F - 18 (FDG) INJECTION
8.1100 | Freq: Once | INTRAVENOUS | Status: AC
  Administered 2022-01-01: 8.11 via INTRAVENOUS

## 2022-01-08 ENCOUNTER — Telehealth: Payer: Self-pay | Admitting: *Deleted

## 2022-01-08 NOTE — Telephone Encounter (Signed)
Wife Stanton Kidney called stating that they were to have received a call Wed or Thursday regarding the PET results. He does not have a follow up appointment as the last appointment disposition states Follow up TBD ? ?IMPRESSION: ?1. Mixed attenuation anterior left upper lobe lesion of concern on ?recent diagnostic chest CT shows no substantial hypermetabolism. ?While reassuring, low-grade or well differentiated neoplasm can be ?poorly FDG avid and continued close attention recommended. ?2. Stable mediastinal lymphadenopathy with low level FDG ?accumulation comparing back to 06/09/2019. Given lack of appreciable ?change, this suggest benign/reactive etiology. ?3. Cirrhosis ?4.  Aortic Atherosclerois (ICD10-170.0) ?  ?  ?Electronically Signed ?  By: Misty Stanley M.D. ?  On: 01/03/2022 08:47 ?   ? ?

## 2022-01-08 NOTE — Telephone Encounter (Signed)
I again attempted to call wife and again got voice mail.I left message for her to return my call ?

## 2022-01-08 NOTE — Telephone Encounter (Signed)
I attempted to call [name] [name] back but got voice mail. I did not leave a message. I will attempt to call again later ?

## 2022-01-10 ENCOUNTER — Ambulatory Visit (INDEPENDENT_AMBULATORY_CARE_PROVIDER_SITE_OTHER): Payer: PPO

## 2022-01-10 ENCOUNTER — Other Ambulatory Visit: Payer: Self-pay

## 2022-01-10 DIAGNOSIS — I4892 Unspecified atrial flutter: Secondary | ICD-10-CM | POA: Diagnosis not present

## 2022-01-10 DIAGNOSIS — Z950 Presence of cardiac pacemaker: Secondary | ICD-10-CM

## 2022-01-10 DIAGNOSIS — I4891 Unspecified atrial fibrillation: Secondary | ICD-10-CM

## 2022-01-10 DIAGNOSIS — Z5181 Encounter for therapeutic drug level monitoring: Secondary | ICD-10-CM

## 2022-01-10 DIAGNOSIS — I482 Chronic atrial fibrillation, unspecified: Secondary | ICD-10-CM

## 2022-01-10 DIAGNOSIS — Z952 Presence of prosthetic heart valve: Secondary | ICD-10-CM | POA: Diagnosis not present

## 2022-01-10 LAB — POCT INR: INR: 4.1 — AB (ref 2.0–3.0)

## 2022-01-10 NOTE — Patient Instructions (Signed)
-   take 1/2 tablet warfarin tonight, then ?- continue Warfarin 1.5 tablets every day EXCEPT 1 tablet on MONDAYS & FRIDAYS. ?- Recheck INR in 4 weeks. ?

## 2022-01-11 DIAGNOSIS — G4733 Obstructive sleep apnea (adult) (pediatric): Secondary | ICD-10-CM | POA: Diagnosis not present

## 2022-01-11 DIAGNOSIS — Z952 Presence of prosthetic heart valve: Secondary | ICD-10-CM | POA: Diagnosis not present

## 2022-01-11 DIAGNOSIS — Z9221 Personal history of antineoplastic chemotherapy: Secondary | ICD-10-CM | POA: Diagnosis not present

## 2022-01-11 DIAGNOSIS — F17290 Nicotine dependence, other tobacco product, uncomplicated: Secondary | ICD-10-CM | POA: Diagnosis not present

## 2022-01-11 DIAGNOSIS — I480 Paroxysmal atrial fibrillation: Secondary | ICD-10-CM | POA: Diagnosis not present

## 2022-01-11 DIAGNOSIS — Z7901 Long term (current) use of anticoagulants: Secondary | ICD-10-CM | POA: Diagnosis not present

## 2022-01-11 DIAGNOSIS — Z8551 Personal history of malignant neoplasm of bladder: Secondary | ICD-10-CM | POA: Diagnosis not present

## 2022-01-11 DIAGNOSIS — E119 Type 2 diabetes mellitus without complications: Secondary | ICD-10-CM | POA: Diagnosis not present

## 2022-01-11 DIAGNOSIS — Z7984 Long term (current) use of oral hypoglycemic drugs: Secondary | ICD-10-CM | POA: Diagnosis not present

## 2022-01-11 DIAGNOSIS — I422 Other hypertrophic cardiomyopathy: Secondary | ICD-10-CM | POA: Diagnosis not present

## 2022-01-11 DIAGNOSIS — Z85528 Personal history of other malignant neoplasm of kidney: Secondary | ICD-10-CM | POA: Diagnosis not present

## 2022-01-11 DIAGNOSIS — D692 Other nonthrombocytopenic purpura: Secondary | ICD-10-CM | POA: Diagnosis not present

## 2022-01-12 ENCOUNTER — Other Ambulatory Visit: Payer: Self-pay | Admitting: Family Medicine

## 2022-01-12 ENCOUNTER — Other Ambulatory Visit: Payer: Self-pay | Admitting: Internal Medicine

## 2022-01-12 DIAGNOSIS — R42 Dizziness and giddiness: Secondary | ICD-10-CM

## 2022-01-12 NOTE — Telephone Encounter (Signed)
Refill Request.  

## 2022-01-15 NOTE — Telephone Encounter (Signed)
Requested medication (s) are due for refill today: yes, a little early for Lipitor ? ?Requested medication (s) are on the active medication list: yes ? ?Last refill:  Lipitor 11/01/21 #90 with 0 RF, Antivert 11/27/21 #90 with 0 RF ? ?Future visit scheduled: 01/25/22 ? ?Notes to clinic:  Labs for Lipitor are from 10/25/2020, therefore failed protocol, Antivert is not delegated. Please assess. ? ? ?  ? ?Requested Prescriptions  ?Pending Prescriptions Disp Refills  ? atorvastatin (LIPITOR) 40 MG tablet [Pharmacy Med Name: Atorvastatin Calcium 40 MG Oral Tablet] 90 tablet 0  ?  Sig: TAKE 1 TABLET BY MOUTH ONCE DAILY . APPOINTMENT REQUIRED FOR FUTURE REFILLS  ?  ? Cardiovascular:  Antilipid - Statins Failed - 01/12/2022  3:00 PM  ?  ?  Failed - Lipid Panel in normal range within the last 12 months  ?  Cholesterol, Total  ?Date Value Ref Range Status  ?10/25/2020 178 100 - 199 mg/dL Final  ? ?LDL Chol Calc (NIH)  ?Date Value Ref Range Status  ?10/25/2020 110 (H) 0 - 99 mg/dL Final  ? ?HDL  ?Date Value Ref Range Status  ?10/25/2020 41 >39 mg/dL Final  ? ?Triglycerides  ?Date Value Ref Range Status  ?10/25/2020 153 (H) 0 - 149 mg/dL Final  ? ?  ?  ?  Passed - Patient is not pregnant  ?  ?  Passed - Valid encounter within last 12 months  ?  Recent Outpatient Visits   ? ?      ? 2 months ago Type 2 diabetes mellitus without complication, without long-term current use of insulin ([place])  ? [facility] [name], MD  ? 5 months ago Need for immunization against influenza  ? [facility], Dionne Bucy, MD  ? 6 months ago Acute otitis externa of left ear, unspecified type  ? [facility] [name], MD  ? 7 months ago Type 2 diabetes mellitus without complication, without long-term current use of insulin ([place])  ? [facility] [name], MD  ? 1 year ago Essential (primary) hypertension  ? [facility] [name], MD  ? ?  ?  ?Future Appointments   ? ?        ? In 1 week [name], MD [facility]  ? In 4 months Fletcher Anon, Mertie Clause, MD [facility], [facility]  ? ?  ? ?  ?  ?  ? meclizine (ANTIVERT) 12.5 MG tablet [Pharmacy Med Name: Meclizine HCl 12.5 MG Oral Tablet] 90 tablet 0  ?  Sig: TAKE 1 TABLET BY MOUTH THREE TIMES DAILY AS NEEDED FOR DIZZINESS  ?  ? Not Delegated - Gastroenterology: Antiemetics Failed - 01/12/2022  3:00 PM  ?  ?  Failed - This refill cannot be delegated  ?  ?  Passed - Valid encounter within last 6 months  ?  Recent Outpatient Visits   ? ?      ? 2 months ago Type 2 diabetes mellitus without complication, without long-term current use of insulin ([place])  ? [facility] [name], MD  ? 5 months ago Need for immunization against influenza  ? [facility], Dionne Bucy, MD  ? 6 months ago Acute otitis externa of left ear, unspecified type  ? [facility] [name], MD  ? 7 months ago Type 2 diabetes mellitus  without complication, without long-term current use of insulin ([place])  ? [facility] [name], MD  ? 1 year ago Essential (primary) hypertension  ? [facility] [name], MD  ? ?  ?  ?Future Appointments   ? ?        ? In 1 week [name], MD [facility]  ? In 4 months Fletcher Anon, Mertie Clause, MD [facility], [facility]  ? ?  ? ?  ?  ?  ? ? ?

## 2022-01-25 ENCOUNTER — Ambulatory Visit (INDEPENDENT_AMBULATORY_CARE_PROVIDER_SITE_OTHER): Payer: PPO | Admitting: Family Medicine

## 2022-01-25 ENCOUNTER — Encounter: Payer: Self-pay | Admitting: Family Medicine

## 2022-01-25 VITALS — BP 107/64 | HR 70 | Resp 16 | Wt 159.0 lb

## 2022-01-25 DIAGNOSIS — C641 Malignant neoplasm of right kidney, except renal pelvis: Secondary | ICD-10-CM | POA: Diagnosis not present

## 2022-01-25 DIAGNOSIS — M8008XA Age-related osteoporosis with current pathological fracture, vertebra(e), initial encounter for fracture: Secondary | ICD-10-CM

## 2022-01-25 DIAGNOSIS — R152 Fecal urgency: Secondary | ICD-10-CM

## 2022-01-25 DIAGNOSIS — I080 Rheumatic disorders of both mitral and aortic valves: Secondary | ICD-10-CM | POA: Diagnosis not present

## 2022-01-25 DIAGNOSIS — R590 Localized enlarged lymph nodes: Secondary | ICD-10-CM

## 2022-01-25 DIAGNOSIS — I700 Atherosclerosis of aorta: Secondary | ICD-10-CM

## 2022-01-25 DIAGNOSIS — D090 Carcinoma in situ of bladder: Secondary | ICD-10-CM | POA: Diagnosis not present

## 2022-01-25 DIAGNOSIS — I421 Obstructive hypertrophic cardiomyopathy: Secondary | ICD-10-CM

## 2022-01-25 DIAGNOSIS — R296 Repeated falls: Secondary | ICD-10-CM

## 2022-01-25 DIAGNOSIS — R159 Full incontinence of feces: Secondary | ICD-10-CM

## 2022-01-25 DIAGNOSIS — I482 Chronic atrial fibrillation, unspecified: Secondary | ICD-10-CM

## 2022-01-25 DIAGNOSIS — I4891 Unspecified atrial fibrillation: Secondary | ICD-10-CM

## 2022-01-25 DIAGNOSIS — I10 Essential (primary) hypertension: Secondary | ICD-10-CM

## 2022-01-25 DIAGNOSIS — G3184 Mild cognitive impairment, so stated: Secondary | ICD-10-CM | POA: Diagnosis not present

## 2022-01-25 NOTE — Patient Instructions (Addendum)
TRY TO STOP MECLIZINE. STOP METFORMIN. TRY PROBIOTIC DAILY.  ?

## 2022-01-25 NOTE — Progress Notes (Signed)
?  ? ? ?Established patient visit ? ?I,[name],acting as a scribe for [name], MD.,have documented all relevant documentation on the behalf of [name], MD,as directed by  [name], MD while in the presence of [name], MD. ? ? ?Patient: [name] [name]   DOB: 1933-12-13   88 y.o. Male  MRN: [id number] ?Visit Date: 01/25/2022 ? ?Today's healthcare provider: [name], MD  ? ?Chief Complaint  ?Patient presents with  ? Follow-up  ? ?Subjective  ?  ?HPI  ?Patient and his wife have moved to from their home into an apartment.  He is no longer driving. ?Patient has no complaints but is having some urgency of bowel and occasionally has soiled himself. ?His wife is concerned about his fall risk.  Gait is unsteady and he is not very strong in recent years. ?Follow up for Frequent falls:  ? ?The patient was last seen for this 2 months ago. ?Changes made at last visit include; advised to follow up in 6-8 weeks. ? ?He reports good compliance with treatment. ?He feels that condition is Unchanged. ?He is not having side effects. none ? ?----------------------------------------------------------------------------------------- ?Follow up for Abnormal gait: ? ?The patient was last seen for this 2 months ago. ?Changes made at last visit include; I think a good bit of this is just failure to thrive and to be 88 years old. Any barium swallow if the occasional choking episodes continue. Referred to PT. ? ?He reports good compliance with treatment. ?He feels that condition is Unchanged. ?He is not having side effects. none ? ?----------------------------------------------------------------------------------------- ? ? ?Medications: ?Outpatient Medications Prior to Visit  ?Medication Sig  ? alendronate (FOSAMAX) 70 MG tablet Take 1 tablet (70 mg total) by mouth once a week. Take with a full glass of water on an empty stomach.  ? ALPRAZolam (XANAX) 0.25 MG tablet Take 1 tablet (0.25  mg total) by mouth every 6 (six) hours as needed for anxiety.  ? amoxicillin (AMOXIL) 500 MG tablet Take 1 tablet (500 mg total) by mouth as needed (TAKE AS NEEDED FOR ANY DENTAL PROCEDURES). Take 4 tablets 30-60 minutes prior to any dental work  ? aspirin EC 81 MG tablet Take 1 tablet (81 mg total) by mouth daily.  ? atorvastatin (LIPITOR) 40 MG tablet TAKE 1 TABLET BY MOUTH ONCE DAILY . APPOINTMENT REQUIRED FOR FUTURE REFILLS  ? glucose blood (ONETOUCH ULTRA) test strip USE 1 STRIP TO CHECK GLUCOSE ONCE DAILY  ? Lancets (ONETOUCH DELICA PLUS [id number]) MISC USE 1  TO CHECK GLUCOSE ONCE DAILY  ? latanoprost (XALATAN) 0.005 % ophthalmic solution Place 1 drop into both eyes at bedtime.   ? levothyroxine (SYNTHROID) 50 MCG tablet Take 1 tablet by mouth once daily  ? meclizine (ANTIVERT) 12.5 MG tablet TAKE 1 TABLET BY MOUTH THREE TIMES DAILY AS NEEDED FOR DIZZINESS  ? metFORMIN (GLUCOPHAGE) 1000 MG tablet Take 1 tablet by mouth once daily with breakfast  ? metoprolol tartrate (LOPRESSOR) 25 MG tablet TAKE 1/2 (ONE-HALF) TABLET BY MOUTH TWICE DAILY.  ? ONETOUCH DELICA LANCETS 89H MISC USE ONE LANCET TO CHECK BLOOD GLUCOSE ONCE DAILY  ? ONETOUCH ULTRA test strip USE 1 STRIP TO CHECK GLUCOSE ONCE DAILY  ? pantoprazole (PROTONIX) 40 MG tablet Take 1 tablet (40 mg total) by mouth 2 (two) times daily.  ? warfarin (COUMADIN) 3 MG tablet Take 1 tablet by mouth daily or as directed by [facility]  ? sertraline (ZOLOFT) 25 MG tablet Take  1 tablet by mouth every morning. (Patient not taking: Reported on 11/15/2021)  ? ?No facility-administered medications prior to visit.  ? ? ?Review of Systems  ?Constitutional:  Negative for appetite change, chills and fever.  ?Respiratory:  Negative for chest tightness, shortness of breath and wheezing.   ?Cardiovascular:  Negative for chest pain and palpitations.  ?Gastrointestinal:  Negative for abdominal pain, nausea and vomiting.  ? ?Last hemoglobin A1c ?Lab Results  ?Component Value  Date  ? HGBA1C 6.0 (H) 11/15/2021  ? ?  ?  Objective  ?  ?BP 107/64 (BP Location: Right Arm, Patient Position: Sitting, Cuff Size: Normal)   Pulse 70   Resp 16   Wt 159 lb (72.1 kg)   SpO2 98%   BMI 23.48 kg/m?  ?BP Readings from Last 3 Encounters:  ?01/25/22 107/64  ?12/15/21 (!) 128/56  ?11/15/21 (!) 149/70  ? ?Wt Readings from Last 3 Encounters:  ?01/25/22 159 lb (72.1 kg)  ?12/15/21 161 lb 6.4 oz (73.2 kg)  ?11/15/21 166 lb (75.3 kg)  ? ?  ? ?Physical Exam ?Vitals reviewed.  ?Constitutional:   ?   Comments: Thin white male who is much more  frail than in previous years  ?HENT:  ?   Head: Normocephalic and atraumatic.  ?   Right Ear: External ear normal.  ?   Left Ear: External ear normal.  ?   Nose: Nose normal.  ?Eyes:  ?   Conjunctiva/sclera: Conjunctivae normal.  ?Cardiovascular:  ?   Rate and Rhythm: Normal rate and regular rhythm.  ?   Heart sounds: Normal heart sounds.  ?Pulmonary:  ?   Effort: Pulmonary effort is normal.  ?   Breath sounds: Normal breath sounds.  ?Abdominal:  ?   Palpations: Abdomen is soft.  ?Musculoskeletal:  ?   Right lower leg: No edema.  ?   Left lower leg: No edema.  ?Skin: ?   General: Skin is warm and dry.  ?Neurological:  ?   General: No focal deficit present.  ?   Mental Status: He is alert.  ?   Coordination: Coordination abnormal.  ?   Comments: He walks with a cane slowly and in general his gait is more unsteady than in previous exams  ?Psychiatric:     ?   Mood and Affect: Mood normal.     ?   Behavior: Behavior normal.  ?  ? ? ? ?  01/25/2022  ?  4:41 PM 07/13/2021  ?  5:33 PM 06/12/2021  ?  4:25 PM  ?Depression screen PHQ 2/9  ?Decreased Interest 1 0 0  ?Down, Depressed, Hopeless 2 0 0  ?PHQ - 2 Score 3 0 0  ?Altered sleeping 0 0 0  ?Tired, decreased energy '3 1 1  '$ ?Change in appetite 2 0 0  ?Feeling bad or failure about yourself  3 0 0  ?Trouble concentrating 0 0 0  ?Moving slowly or fidgety/restless 3 0 0  ?Suicidal thoughts 0 0 0  ?PHQ-9 Score '14 1 1  '$ ?Difficult doing  work/chores Extremely dIfficult  Not difficult at all  ? ? ?No results found for any visits on 01/25/22. ? Assessment & Plan  ?  ? ?1. Mitral and aortic incompetence ? ? ?2. Mediastinal lymphadenopathy ?Followed by oncology ? ?3. Essential (primary) hypertension ?Auditor home blood pressures ? ?4. Chronic atrial fibrillation (HCC) ?Anticoagulation ? ?5. HOCM (hypertrophic obstructive cardiomyopathy) ([place]) ?Had myotomy ? ?6. Fracture of vertebra due to osteoporosis, initial encounter [facility]) ?Resolved. ? ?  7. Atrial fibrillation, unspecified type ([place]) ? ? ?8. Frequent falls ?For to physical therapy for strengthening  ?9. MCI (mild cognitive impairment)/Depression ?MMSE on next visit ? ?10. Carcinoma in situ of bladder ? ? ?11. Cancer of right kidney [facility]) ? ? ?12. Atherosclerosis of aorta ([place]) ? ?13.Fecal Urgency ?Stop Metformin and try probiotic daily ? ?No follow-ups on file.  ?   ? ?I, [name], MD, have reviewed all documentation for this visit. The documentation on 02/02/22 for the exam, diagnosis, procedures, and orders are all accurate and complete. ? ? ? ?Irini Leet Cranford Mon, MD  ?[facility] ?[phone] (phone) ?[phone] (fax) ? ?[facility] ?

## 2022-01-31 ENCOUNTER — Ambulatory Visit (INDEPENDENT_AMBULATORY_CARE_PROVIDER_SITE_OTHER): Payer: PPO

## 2022-01-31 DIAGNOSIS — Z952 Presence of prosthetic heart valve: Secondary | ICD-10-CM

## 2022-01-31 DIAGNOSIS — Z5181 Encounter for therapeutic drug level monitoring: Secondary | ICD-10-CM | POA: Diagnosis not present

## 2022-01-31 DIAGNOSIS — I482 Chronic atrial fibrillation, unspecified: Secondary | ICD-10-CM

## 2022-01-31 DIAGNOSIS — I4892 Unspecified atrial flutter: Secondary | ICD-10-CM

## 2022-01-31 DIAGNOSIS — I4891 Unspecified atrial fibrillation: Secondary | ICD-10-CM

## 2022-01-31 DIAGNOSIS — Z950 Presence of cardiac pacemaker: Secondary | ICD-10-CM

## 2022-01-31 LAB — POCT INR: INR: 2.4 (ref 2.0–3.0)

## 2022-01-31 NOTE — Patient Instructions (Signed)
-  TAKE 2 TABLETS TONIGHT ?- continue Warfarin 1.5 tablets every day EXCEPT 1 tablet on MONDAYS & FRIDAYS. ?- Recheck INR in 6 weeks. ?

## 2022-02-06 DIAGNOSIS — Z950 Presence of cardiac pacemaker: Secondary | ICD-10-CM | POA: Diagnosis not present

## 2022-02-09 ENCOUNTER — Telehealth: Payer: Self-pay | Admitting: Cardiovascular Disease

## 2022-02-09 NOTE — Telephone Encounter (Signed)
Wife called back.   Informed her pt should be taking warfarin 1 1/2 tablets daily except 1 tablet on Mondays and Fridays.  [place] the beach and she doesn't think he has been taking it right.  Told her to get pt back on schedule and INR appt moved up to 02/21/22 when they return from the beach.  She verbalized understanding and was appreciative. ?

## 2022-02-09 NOTE — Telephone Encounter (Signed)
Pt c/o medication issue: ? ?1. Name of Medication: warfarin (COUMADIN) 3 MG tablet ? ?2. How are you currently taking this medication (dosage and times per day)? As directed  ? ?3. Are you having a reaction (difficulty breathing--STAT)? ? ?4. What is your medication issue? [place] the [place]. Wife does not know what the updated dose of medication should be based on his last coumadin check ?

## 2022-02-09 NOTE — Telephone Encounter (Signed)
Called.  Got wife's voicemail.  Left message for her with her husbands current warfarin dose schedule per her request.  She is to call back if she has further questions. ?

## 2022-02-10 DIAGNOSIS — Z950 Presence of cardiac pacemaker: Secondary | ICD-10-CM | POA: Diagnosis not present

## 2022-02-21 ENCOUNTER — Ambulatory Visit (INDEPENDENT_AMBULATORY_CARE_PROVIDER_SITE_OTHER): Payer: PPO

## 2022-02-21 DIAGNOSIS — Z952 Presence of prosthetic heart valve: Secondary | ICD-10-CM

## 2022-02-21 DIAGNOSIS — I482 Chronic atrial fibrillation, unspecified: Secondary | ICD-10-CM

## 2022-02-21 DIAGNOSIS — I4892 Unspecified atrial flutter: Secondary | ICD-10-CM

## 2022-02-21 DIAGNOSIS — Z950 Presence of cardiac pacemaker: Secondary | ICD-10-CM

## 2022-02-21 DIAGNOSIS — I4891 Unspecified atrial fibrillation: Secondary | ICD-10-CM

## 2022-02-21 DIAGNOSIS — Z5181 Encounter for therapeutic drug level monitoring: Secondary | ICD-10-CM

## 2022-02-21 LAB — POCT INR: INR: 3 (ref 2.0–3.0)

## 2022-02-21 NOTE — Patient Instructions (Signed)
-   continue Warfarin 1.5 tablets every day EXCEPT 1 tablet on [place]. ?- Recheck INR in 6 weeks. ?

## 2022-02-22 ENCOUNTER — Telehealth: Payer: Self-pay

## 2022-02-22 NOTE — Telephone Encounter (Signed)
Copied from [place] [phone]. Topic: Referral - Status ?>> Feb 22, 2022  1:14 PM Erick Blinks wrote: ?Reason for CRM: Pt's wife called back returning missed calls from referrals, please advise ?

## 2022-03-02 ENCOUNTER — Telehealth: Payer: Self-pay

## 2022-03-02 NOTE — Telephone Encounter (Signed)
Copied from [place]. Topic: General - Other >> Mar 02, 2022 10:09 AM McGill, Nelva Bush wrote: Reason for CRM: Pt wife called stated she had a missed call from [facility]. No notes wife stated if we still need to speak with her to call back.

## 2022-03-02 NOTE — Telephone Encounter (Signed)
Last telephone note was about a referral. Last CRM was routed to referral coordinator.

## 2022-03-19 ENCOUNTER — Encounter: Payer: Self-pay | Admitting: Physical Therapy

## 2022-03-19 ENCOUNTER — Ambulatory Visit: Payer: PPO | Attending: Family Medicine | Admitting: Physical Therapy

## 2022-03-19 DIAGNOSIS — R262 Difficulty in walking, not elsewhere classified: Secondary | ICD-10-CM | POA: Diagnosis not present

## 2022-03-19 DIAGNOSIS — R2681 Unsteadiness on feet: Secondary | ICD-10-CM | POA: Diagnosis not present

## 2022-03-19 DIAGNOSIS — R269 Unspecified abnormalities of gait and mobility: Secondary | ICD-10-CM | POA: Diagnosis not present

## 2022-03-19 DIAGNOSIS — R296 Repeated falls: Secondary | ICD-10-CM | POA: Diagnosis not present

## 2022-03-19 DIAGNOSIS — R2689 Other abnormalities of gait and mobility: Secondary | ICD-10-CM | POA: Diagnosis not present

## 2022-03-19 NOTE — Addendum Note (Signed)
Addended by: Rivka Barbara B on: 03/19/2022 01:00 PM   Modules accepted: Orders

## 2022-03-19 NOTE — Therapy (Signed)
[place] MAIN [facility] SERVICES [address] [place], [place], [address] Phone: [phone]   Fax:  [phone]  Physical Therapy Evaluation  Patient Details  Name: [name] [name] MRN: [id number] Date of Birth: [birth year] Referring Provider (PT): Rudean Hitt   Encounter Date: 03/19/2022   PT End of Session - 03/19/22 1130     Visit Number 1    Number of Visits 24    Date for PT Re-Evaluation 06/11/22    Progress Note Due on Visit 10    PT Start Time 6283    PT Stop Time 1144    PT Time Calculation (min) 42 min    Equipment Utilized During Treatment Gait belt    Activity Tolerance Patient tolerated treatment well    Behavior During Therapy [facility] for tasks assessed/performed             Past Medical History:  Diagnosis Date   Aortic stenosis    a. 01/2016 echo: mod AS; b. 04/2018 Cardiac MRI: sev AS; c. 05/2018 s/p mech AVR @ time of myomectomy; d. 07/2018 Echo: Mild AS/AI.   Atherosclerosis of aorta ([place])    by CT scan in past   Atrial fibrillation (HCC)    and aflutter. pt has a left atrial circuit that is not ablated. was on amiodarone-stopped, now use rate control.    Bladder cancer ([place])    hx; treated with BCG in past    Carotid artery disease ([place])    There was calcified plaque but no stenosis by carotid artery screening done at [facility] October, 2013   CHB (complete heart block) [facility])    a. 05/2018 s/p BSX VVIR PPM ([facility]) in setting of bradycardia following myomectomy and AVR/MVR.   Diabetes mellitus    type not specified   Diabetic neuropathy ([place])    feet   Elevated liver enzymes    over time; hx   Excessive sweating    Glaucoma    Gout    Head injury    when slipped on ice 2004-2005. stabilized and back on Coumadin    Headache    migraines - distant past   History of cardiac cath    a. 05/2018 Cath ([facility]): Non-obstructive CAD. Mildly elevated filling pressures w/ nl CI.   HOCM (hypertrophic  obstructive cardiomyopathy) ([place])    a. 01/2016 Echo: EF 65-70%, no rwma, LVOT gradient of 80-25mHg, mod AS, SAM; b. 11/2017 Echo: EF 55-60%, near cavity obliteration in systole, mod AS, mild MS; c. 04/2018 Card MRI ([facility]): Sev LVH, EF >70%, Sev AS, LVOT obs w/ Sev MR, mild to mod TR/PR, mid-myocardial HE in basal-mid anteroseptum and inferoseptum; d. 05/2018 s/p Septal myomectomy; e. 07/2018 Echo: EF 60-65%. PASP 335mg.   Homocystinemia ([facility]   elevated, mild    Hypercholesterolemia    treated.    Hypertension    Infection of right inner ear    Kidney mass    a. s/p laproscopic surgery woth cryoablation of a mass outside [facility]2018 CT Chest: 2cm R kidney mass.   Mediastinal adenopathy    a. 12/208 CT Chest: up to 60m45mLL lung nodule. 1.5-1.7cm subcarinal/mediastinal adenopahty/right paratracheal lymph node; b. 11/2018 CT Chest: 2.5-3cm mediastinal adenopathy.   Mitral regurgitation    a. 04/2018 Cardiac MRI: sev MR; b. 05/2018 s/p mech MVR @ time of myomectomy; c. 07/2018 Echo: Mild MS.   Motion sickness    ocean boats   Nausea  Nonobstructive coronary artery disease    a. 05/2018 Cath ([facility]): nonobs CAD.   Obstructive sleep apnea    CPAP started successfully 2014   Orthostatic hypotension    a. orthostatic. dehydration. hospitalized 11/11   Sleep apnea    Significant obstructive sleep apnea diagnosed in August, 2012, the patient is to receive CPAP    Stroke [facility])    "2 old strokes" CT and MRI. [facility] 11/11. diagnosis was dehydration, no acutal reports.    TSH elevation    on synthroid historically    Unsteady gait    August, 2012    Past Surgical History:  Procedure Laterality Date   BLADDER SURGERY     CATARACT EXTRACTION W/PHACO Left 05/11/2015   Procedure: CATARACT EXTRACTION PHACO AND INTRAOCULAR LENS PLACEMENT ([place]);  Surgeon: Leandrew Koyanagi, MD;  Location: [place];  Service: Ophthalmology;  Laterality: Left;  DIABETIC - oral  meds, CPAP   COLONOSCOPY WITH PROPOFOL N/A 04/14/2019   Procedure: COLONOSCOPY WITH PROPOFOL;  Surgeon: [name], MD;  Location: [facility] ENDOSCOPY;  Service: Endoscopy;  Laterality: N/A;   ESOPHAGOGASTRODUODENOSCOPY (EGD) WITH PROPOFOL N/A 07/01/2018   Procedure: ESOPHAGOGASTRODUODENOSCOPY (EGD) WITH PROPOFOL;  Surgeon: [name], MD;  Location: [facility] ENDOSCOPY;  Service: Endoscopy;  Laterality: N/A;   INSERT / REPLACE / [place]     "froze mass"   MECHANICAL AORTIC AND MITRAL VALVE REPLACEMENT  05/2018   [facility]    TONSILLECTOMY     VALVE REPLACEMENT      There were no vitals filed for this visit.    Subjective Assessment - 03/19/22 1107     Subjective Pt report she has been having balance issues secondary to his ears. Pt was in the artilerary. 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 Pt sold a home in april and has fallen 5 times since then. Pt tripped over a piece of wood in back yard and fell on one occassion. Pt has history of cardiac rehab here at [facility]. Pt has a very difficult time with balance without his cane. He has difficulty with balance when heturns his head.    Patient is accompained by: Family member   Wife   Pertinent History Pt report she has been having balance issues secondary to his ears. Pt was in the artilerary. 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 Pt sold a home in april and has fallen 5 times since then. Pt tripped over a piece of wood in back yard and fell on one occassion. Pt has history of cardiac rehab here at [facility]. Pt has a very difficult time with balance without his cane. He has difficulty with balance when heturns his head.    Limitations Walking    How long can you stand comfortably? 10 minutes    How long can you walk comfortably? 10 minutes    Patient Stated Goals Improve endurance    Currently in Pain? No/denies                 [facility] PT Assessment - 03/19/22 0001       Assessment   Medical Diagnosis Inmalance    Referring Provider (PT) Rudean Hitt      Precautions   Precautions Fall      Balance Screen   Has the patient fallen in the past 6  months Yes    How many times? several    Has the patient had a decrease in activity level because of a fear of falling?  Yes    Is the patient reluctant to leave their home because of a fear of falling?  Yes      Observation/Other Assessments   Focus on Therapeutic Outcomes (FOTO)  42      ROM / Strength   AROM / PROM / Strength Strength      Strength   Strength Assessment Site Hip;Knee    Right/Left Hip Right;Left    Right Hip Flexion 4/5    Right Hip ABduction 4/5    Right Hip ADduction 4/5    Left Hip Flexion 4/5    Left Hip ABduction 4/5    Left Hip ADduction 4/5    Right/Left Knee Right;Left    Right Knee Flexion 4/5    Right Knee Extension 4/5    Left Knee Flexion 4/5    Left Knee Extension 4/5      Standardized Balance Assessment   Standardized Balance Assessment Five Times Sit to Stand;Berg Balance Test    Five times sit to stand comments  23.53      Berg Balance Test   Sit to Stand Able to stand  independently using hands    Standing Unsupported Able to stand 2 minutes with supervision    Sitting with Back Unsupported but Feet Supported on Floor or Stool Able to sit safely and securely 2 minutes    Stand to Sit Controls descent by using hands    Transfers Able to transfer safely, definite need of hands    Standing Unsupported with Eyes Closed Able to stand 3 seconds    Standing Unsupported with Feet Together Able to place feet together independently and stand for 1 minute with supervision    From Standing, Reach Forward with Outstretched Arm Can reach forward >5 cm safely (2")    From Standing Position, Pick up Object from Floor Unable to pick up and needs supervision    From Standing Position, Turn to Look Behind Over  each Shoulder Turn sideways only but maintains balance    Turn 360 Degrees Able to turn 360 degrees safely but slowly    Standing Unsupported, Alternately Place Feet on Step/Stool Able to stand independently and complete 8 steps >20 seconds    Standing Unsupported, One Foot in Front Able to take small step independently and hold 30 seconds    Standing on One Leg Tries to lift leg/unable to hold 3 seconds but remains standing independently    Total Score 34                        Objective measurements completed on examination: See above findings.                PT Education - 03/19/22 1237     Education Details POC    Person(s) Educated Patient    Methods Explanation    Comprehension Verbalized understanding              PT Short Term Goals - 03/19/22 1238       PT SHORT TERM GOAL #1   Title Patient will be independent in home exercise program to improve strength/mobility for better functional independence with ADLs.    Baseline no HEP    Time 4    Period Weeks    Status New    Target  Date 04/16/22               PT Long Term Goals - 03/19/22 1239       PT LONG TERM GOAL #1   Title Patient will increase FOTO score to equal to or greater than   48  to demonstrate statistically significant improvement in mobility and quality of life.    Baseline 42    Time 8    Period Weeks    Status New    Target Date 06/11/22      PT LONG TERM GOAL #2   Title Patient  will complete five times sit to stand test in < 17 seconds indicating an increased LE strength and improved balance.    Baseline 23.53 sec    Time 12    Period Weeks    Status New    Target Date 06/11/22      PT LONG TERM GOAL #3   Title Patient will increase Berg Balance score by > 6 points to demonstrate decreased fall risk during functional activities.    Baseline 34    Time 12    Period Weeks    Status New      PT LONG TERM GOAL #4   Title Patient will increase six minute  walk test distance to >1000 for progression to community ambulator and improve gait ability    Baseline test session 2    Time 12    Period Weeks    Status New    Target Date 06/11/22      PT LONG TERM GOAL #5   Title Patient will increase 10 meter walk test to >1.44ms as to improve gait speed for better community ambulation and to reduce fall risk.    Baseline to test session 2    Time 12    Period Weeks    Status New    Target Date 06/11/22                    Plan - 03/19/22 1131     Clinical Impression Statement Patient presents to physical therapy with his wife and caregiver.  Patient is hard of hearing and wears hearing aids to assist with his hearing.  Patient presents with lower extremity weakness as evidenced by manual muscle testing as well as 5 to stand test below age-matched norms.  5 times sit to stand test also places patient at increased risk for falls.  Patient also completes Berg balance test and this test also places him at increased risk for falls.  We will assess 6-minute walk test as well as 10 m walk test at next physical therapy session and will also establish home exercise program for completion while patient is on vacation for 2 weeks at the end of this month.  Patient will benefit from skilled physical therapy intervention in order to improve his lower extremity strength, endurance, reduce his risk of falls, improve his balance, and increase his quality of life.    Personal Factors and Comorbidities Age;Comorbidity 1;Comorbidity 2;Comorbidity 3+    Comorbidities CAD, Peripheral neuropathy, T2DM, HTN,    Examination-Activity Limitations Caring for Others;Carry;Locomotion Level;Stairs;Stand    Examination-Participation Restrictions Yard Work;Shop;Community Activity    Stability/Clinical Decision Making Evolving/Moderate complexity    Clinical Decision Making Moderate    Rehab Potential Fair    PT Frequency 2x / week    PT Duration 12 weeks    PT  Treatment/Interventions ADLs/Self Care Home Management;Moist Heat;DME Instruction;Stair training;Functional mobility  training;Therapeutic activities;Therapeutic exercise;Balance training;Neuromuscular re-education;Patient/family education;Wheelchair mobility training;Manual techniques;Dry needling;Energy conservation    PT Next Visit Plan test 6MWT and 10MWT, also establish HEP for 2 weeks pt will be at [place] to establish next session    Consulted and Agree with Plan of Care Patient;Family member/caregiver             Patient will benefit from skilled therapeutic intervention in order to improve the following deficits and impairments:  Abnormal gait, Decreased activity tolerance, Decreased endurance, Decreased strength  Visit Diagnosis: Unsteadiness on feet  Difficulty in walking, not elsewhere classified  Other abnormalities of gait and mobility     Problem List Patient Active Problem List   Diagnosis Date Noted   Other abnormalities of gait and mobility 11/15/2021   Anxiety 11/15/2021   Depression 11/15/2021   Encounter for fitting and adjustment of hearing aid 11/15/2021   Abnormal CT scan    Abnormal findings on radiological examination of gastrointestinal tract    Polyp of colon    Cancer of right kidney ([place]) 12/10/2018   Pulmonary nodules 12/01/2018   Mediastinal lymphadenopathy 12/01/2018   Heart block AV third degree ([place]) 10/07/2018   Pacemaker 10/07/2018   H/O aortic valve replacement 10/07/2018   Blood in stool    Bleeding duodenal ulcer    GIB (gastrointestinal bleeding) 06/30/2018   Acute upper GI bleed    Symptomatic anemia    Decreased activities of daily living (ADL) 06/09/2018   Decreased strength, endurance, and mobility 06/09/2018   Pleural effusion, right 06/09/2018   S/P AVR (aortic valve replacement) 06/08/2018   H/O ventricular septal myectomy 06/08/2018   Chronic anticoagulation 06/02/2018   Heart murmur, systolic  95/18/8416   H/O: rheumatic fever 03/21/2017   Tinnitus 10/30/2016   On warfarin therapy 07/06/2015   Arteriosclerosis of coronary artery 06/13/2015   Carcinoma in situ of bladder 06/13/2015   Diabetes mellitus, type 2 ([place]) 06/13/2015   Diabetic neuropathy ([place]) 06/13/2015   Dizziness 06/13/2015   Essential (primary) hypertension 06/13/2015   Fatigue 06/13/2015   Glaucoma 06/13/2015   Hypercholesteremia 06/13/2015   Male hypogonadism 06/13/2015   Adult hypothyroidism 06/13/2015   Arthritis, degenerative 06/13/2015   CAP (community acquired pneumonia) 06/13/2015   Adenocarcinoma, renal cell ([place]) 06/13/2015   Benign head tremor 12/13/2014   Carotid arterial disease ([place]) 07/12/2014   Decreased cardiac output 07/12/2014   Cardiomyopathy, hypertrophic obstructive ([place]) 07/12/2014   Head injuries 07/12/2014   HOCM (hypertrophic obstructive cardiomyopathy) ([place])    Encounter for therapeutic drug monitoring 11/16/2013   Obstructive sleep apnea    Benign prostatic hypertrophy without urinary obstruction 06/09/2012   History of neoplasm of bladder 06/09/2012   Head injury    Atherosclerosis of aorta (HCC)    Hypotension    Stroke ([place])    Unsteady gait    Excessive sweating    Nausea    ORTHOSTATIC HYPOTENSION, HX OF 09/01/2010   Overweight(278.02) 03/07/2009   Overweight 03/07/2009   MITRAL REGURGITATION 03/05/2009   Chronic atrial fibrillation ([place]) 03/05/2009   Atrial fibrillation and flutter ([place]) 03/05/2009   Mitral and aortic incompetence 03/05/2009    Particia Lather, PT 03/19/2022, 12:57 PM  [place] MAIN [facility] SERVICES [address] [place], [place], [address] Phone: [phone]   Fax:  [phone]  Name: DARIN ARNDT MRN: [id number] Date of Birth: [birth year]

## 2022-03-26 DIAGNOSIS — H353132 Nonexudative age-related macular degeneration, bilateral, intermediate dry stage: Secondary | ICD-10-CM | POA: Diagnosis not present

## 2022-03-26 LAB — HM DIABETES EYE EXAM

## 2022-03-27 ENCOUNTER — Ambulatory Visit (INDEPENDENT_AMBULATORY_CARE_PROVIDER_SITE_OTHER): Payer: PPO | Admitting: Family Medicine

## 2022-03-27 ENCOUNTER — Encounter: Payer: Self-pay | Admitting: Family Medicine

## 2022-03-27 DIAGNOSIS — R590 Localized enlarged lymph nodes: Secondary | ICD-10-CM | POA: Diagnosis not present

## 2022-03-27 DIAGNOSIS — I080 Rheumatic disorders of both mitral and aortic valves: Secondary | ICD-10-CM

## 2022-03-27 DIAGNOSIS — F439 Reaction to severe stress, unspecified: Secondary | ICD-10-CM | POA: Insufficient documentation

## 2022-03-27 DIAGNOSIS — T148XXA Other injury of unspecified body region, initial encounter: Secondary | ICD-10-CM

## 2022-03-27 DIAGNOSIS — I4891 Unspecified atrial fibrillation: Secondary | ICD-10-CM

## 2022-03-27 DIAGNOSIS — I2510 Atherosclerotic heart disease of native coronary artery without angina pectoris: Secondary | ICD-10-CM

## 2022-03-27 DIAGNOSIS — E7800 Pure hypercholesterolemia, unspecified: Secondary | ICD-10-CM | POA: Diagnosis not present

## 2022-03-27 DIAGNOSIS — G3184 Mild cognitive impairment, so stated: Secondary | ICD-10-CM

## 2022-03-27 DIAGNOSIS — I482 Chronic atrial fibrillation, unspecified: Secondary | ICD-10-CM

## 2022-03-27 DIAGNOSIS — B351 Tinea unguium: Secondary | ICD-10-CM | POA: Diagnosis not present

## 2022-03-27 DIAGNOSIS — Z7901 Long term (current) use of anticoagulants: Secondary | ICD-10-CM

## 2022-03-27 DIAGNOSIS — E119 Type 2 diabetes mellitus without complications: Secondary | ICD-10-CM | POA: Diagnosis not present

## 2022-03-27 DIAGNOSIS — I10 Essential (primary) hypertension: Secondary | ICD-10-CM | POA: Diagnosis not present

## 2022-03-27 DIAGNOSIS — F329 Major depressive disorder, single episode, unspecified: Secondary | ICD-10-CM | POA: Insufficient documentation

## 2022-03-27 DIAGNOSIS — Z23 Encounter for immunization: Secondary | ICD-10-CM

## 2022-03-27 DIAGNOSIS — I779 Disorder of arteries and arterioles, unspecified: Secondary | ICD-10-CM

## 2022-03-27 DIAGNOSIS — I4892 Unspecified atrial flutter: Secondary | ICD-10-CM

## 2022-03-27 MED ORDER — TETANUS-DIPHTHERIA TOXOIDS TD 5-2 LFU IM INJ: 0.5000 mL | INJECTION | Freq: Once | INTRAMUSCULAR | Status: DC

## 2022-03-27 NOTE — Progress Notes (Signed)
Established patient visit  I,[name],acting as a scribe for [name], MD.,have documented all relevant documentation on the behalf of [name], MD,as directed by  [name], MD while in the presence of [name], MD.   Patient: [name] [name]   DOB: 01/04/1934   88 y.o. Male  MRN: [id number] Visit Date: 03/27/2022  Today's healthcare provider: [name], MD   Chief Complaint  Patient presents with   Follow-up   Hypertension   Subjective    HPI  Patient is a good spirits but physically he is struggling.  He is getting weaker recently he fell and has a laceration to his right lower leg along the mid shin , no signs of infection. All his medical problems are apparently stable over the weakness. He is tolerating his medications well..   Hypertension, follow-up  BP Readings from Last 3 Encounters:  01/25/22 107/64  12/15/21 (!) 128/56  11/15/21 (!) 149/70   Wt Readings from Last 3 Encounters:  01/25/22 159 lb (72.1 kg)  12/15/21 161 lb 6.4 oz (73.2 kg)  11/15/21 166 lb (75.3 kg)     He was last seen for hypertension 2 months ago.  Management since that visit includes; advised to monitor home blood pressures.  Outside blood pressures are not checking.  Pertinent labs Lab Results  Component Value Date   CHOL 178 10/25/2020   HDL 41 10/25/2020   LDLCALC 110 (H) 10/25/2020   TRIG 153 (H) 10/25/2020   CHOLHDL 4.3 10/25/2020   Lab Results  Component Value Date   NA 136 12/15/2021   K 3.9 12/15/2021   CREATININE 0.91 12/15/2021   GFRNONAA >60 12/15/2021   GLUCOSE 183 (H) 12/15/2021   TSH 3.300 07/30/2019     The ASCVD Risk score (Arnett DK, et al., 2019) failed to calculate for the following reasons:   The 2019 ASCVD risk score is only valid for ages 34 to 67   The patient has a prior MI or stroke  diagnosis  --------------------------------------------------------------------------------------------------- Follow up for MCI (mild cognitive impairment)/Depression  The patient was last seen for this 2 months ago. Changes made at last visit include; MMSE on next visit.  He reports good compliance with treatment. He feels that condition is Unchanged. He is not having side effects. none  ----------------------------------------------------------------------------------------- Follow up for Fecal Urgency  The patient was last seen for this 2 months ago. Changes made at last visit include; Stop Metformin and try probiotic daily.  -----------------------------------------------------------------------------------------    03/27/2022    3:26 PM 11/15/2021    3:32 PM 10/24/2020   11:37 AM  MMSE - Mini Mental State Exam  Orientation to time '4 4 5  '$ Orientation to Place '4 4 5  '$ Registration '3 3 3  '$ Attention/ Calculation '5 5 5  '$ Recall '3 3 2  '$ Language- name 2 objects '2 2 2  '$ Language- repeat '1 1 1  '$ Language- follow 3 step command '3 3 3  '$ Language- read & follow direction '1 1 1  '$ Write a sentence '1 1 1  '$ Copy design '1 1 1  '$ Total score '28 28 29     '$ Medications: Outpatient Medications Prior to Visit  Medication Sig   alendronate (FOSAMAX) 70 MG tablet Take 1 tablet (70 mg total) by mouth once a week. Take with a full glass of water on an empty stomach.   ALPRAZolam (XANAX) 0.25 MG tablet Take 1 tablet (0.25 mg total) by mouth  every 6 (six) hours as needed for anxiety.   amoxicillin (AMOXIL) 500 MG tablet Take 1 tablet (500 mg total) by mouth as needed (TAKE AS NEEDED FOR ANY DENTAL PROCEDURES). Take 4 tablets 30-60 minutes prior to any dental work   aspirin EC 81 MG tablet Take 1 tablet (81 mg total) by mouth daily.   atorvastatin (LIPITOR) 40 MG tablet TAKE 1 TABLET BY MOUTH ONCE DAILY . APPOINTMENT REQUIRED FOR FUTURE REFILLS   glucose blood (ONETOUCH ULTRA) test strip USE 1 STRIP TO  CHECK GLUCOSE ONCE DAILY   Lancets (ONETOUCH DELICA PLUS [id number]) MISC USE 1  TO CHECK GLUCOSE ONCE DAILY   latanoprost (XALATAN) 0.005 % ophthalmic solution Place 1 drop into both eyes at bedtime.    levothyroxine (SYNTHROID) 50 MCG tablet Take 1 tablet by mouth once daily   metoprolol tartrate (LOPRESSOR) 25 MG tablet TAKE 1/2 (ONE-HALF) TABLET BY MOUTH TWICE DAILY.   ONETOUCH DELICA LANCETS 10G MISC USE ONE LANCET TO CHECK BLOOD GLUCOSE ONCE DAILY   ONETOUCH ULTRA test strip USE 1 STRIP TO CHECK GLUCOSE ONCE DAILY   pantoprazole (PROTONIX) 40 MG tablet Take 1 tablet (40 mg total) by mouth 2 (two) times daily.   warfarin (COUMADIN) 3 MG tablet Take 1 tablet by mouth daily or as directed by Coumadin Clinic   meclizine (ANTIVERT) 12.5 MG tablet TAKE 1 TABLET BY MOUTH THREE TIMES DAILY AS NEEDED FOR DIZZINESS (Patient not taking: Reported on 03/27/2022)   metFORMIN (GLUCOPHAGE) 1000 MG tablet Take 1 tablet by mouth once daily with breakfast   sertraline (ZOLOFT) 25 MG tablet Take 1 tablet by mouth every morning. (Patient not taking: Reported on 11/15/2021)   No facility-administered medications prior to visit.    Review of Systems  Last hemoglobin A1c Lab Results  Component Value Date   HGBA1C 6.5 (H) 03/27/2022       Objective    There were no vitals taken for this visit. BP Readings from Last 3 Encounters:  01/25/22 107/64  12/15/21 (!) 128/56  11/15/21 (!) 149/70   Wt Readings from Last 3 Encounters:  01/25/22 159 lb (72.1 kg)  12/15/21 161 lb 6.4 oz (73.2 kg)  11/15/21 166 lb (75.3 kg)      Physical Exam Vitals reviewed.  Constitutional:      Comments: Thin white male who is much more  frail than in previous years  HENT:     Head: Normocephalic and atraumatic.     Right Ear: External ear normal.     Left Ear: External ear normal.     Nose: Nose normal.  Eyes:     Conjunctiva/sclera: Conjunctivae normal.  Cardiovascular:     Rate and Rhythm: Normal rate and regular  rhythm.     Heart sounds: Normal heart sounds.  Pulmonary:     Effort: Pulmonary effort is normal.     Breath sounds: Normal breath sounds.  Abdominal:     Palpations: Abdomen is soft.  Musculoskeletal:     Right lower leg: No edema.     Left lower leg: No edema.  Skin:    General: Skin is warm and dry.     Comments: 2 inch laceration along his right shin mid lower leg that is not infected and has started to heal.  Neurological:     General: No focal deficit present.     Mental Status: He is alert.     Coordination: Coordination abnormal.     Comments: He walks with a cane  slowly and in general his gait is more unsteady than in previous exams  Psychiatric:        Mood and Affect: Mood normal.        Behavior: Behavior normal.       No results found for any visits on 03/27/22.  Assessment & Plan     1. Essential (primary) hypertension Excellent blood pressure control. - CBC w/Diff/Platelet - TSH - Hemoglobin A1c  2. Type 2 diabetes mellitus without complication, without long-term current use of insulin (HCC) A1c has been under good control.  Last was 6.0.  No hypoglycemia. - CBC w/Diff/Platelet - TSH - Hemoglobin A1c  3. Chronic atrial fibrillation (HCC) Anticoagulated. - CBC w/Diff/Platelet - TSH - Hemoglobin A1c  4. MCI (mild cognitive impairment) MMSE on next visit. More than 50% 25 minute visit spent in counseling or coordination of care  - CBC w/Diff/Platelet - TSH - Hemoglobin A1c  5. Carotid artery disease, unspecified laterality, unspecified type ([place]) All risk factors treated - CBC w/Diff/Platelet - TSH - Hemoglobin A1c  6. Hypercholesteremia On high-dose atorvastatin 40 - CBC w/Diff/Platelet - TSH - Hemoglobin A1c  7. Multiple skin tears  - tetanus & diphtheria toxoids (adult) (TENIVAC) injection 0.5 mL  8. MITRAL REGURGITATION   9. Mediastinal lymphadenopathy Followed by oncology  10. Atrial fibrillation and flutter ([place])   11.  Arteriosclerosis of coronary artery   12. Chronic anticoagulation    No follow-ups on file.      I, [name], MD, have reviewed all documentation for this visit. The documentation on 04/02/22 for the exam, diagnosis, procedures, and orders are all accurate and complete.    Myshawn Chiriboga Cranford Mon, MD  [facility] [phone] (phone) [phone] (fax)  [place]

## 2022-03-28 ENCOUNTER — Ambulatory Visit: Payer: PPO | Admitting: Physical Therapy

## 2022-03-28 ENCOUNTER — Ambulatory Visit (INDEPENDENT_AMBULATORY_CARE_PROVIDER_SITE_OTHER): Payer: PPO

## 2022-03-28 DIAGNOSIS — Z5181 Encounter for therapeutic drug level monitoring: Secondary | ICD-10-CM | POA: Diagnosis not present

## 2022-03-28 DIAGNOSIS — I482 Chronic atrial fibrillation, unspecified: Secondary | ICD-10-CM | POA: Diagnosis not present

## 2022-03-28 DIAGNOSIS — I4891 Unspecified atrial fibrillation: Secondary | ICD-10-CM | POA: Diagnosis not present

## 2022-03-28 DIAGNOSIS — I4892 Unspecified atrial flutter: Secondary | ICD-10-CM

## 2022-03-28 LAB — CBC WITH DIFFERENTIAL/PLATELET
Basophils Absolute: 0.1 10*3/uL (ref 0.0–0.2)
Basos: 1 %
EOS (ABSOLUTE): 0.3 10*3/uL (ref 0.0–0.4)
Eos: 4 %
Hematocrit: 35.1 % — ABNORMAL LOW (ref 37.5–51.0)
Hemoglobin: 12 g/dL — ABNORMAL LOW (ref 13.0–17.7)
Immature Grans (Abs): 0 10*3/uL (ref 0.0–0.1)
Immature Granulocytes: 0 %
Lymphocytes Absolute: 1.6 10*3/uL (ref 0.7–3.1)
Lymphs: 24 %
MCH: 31.4 pg (ref 26.6–33.0)
MCHC: 34.2 g/dL (ref 31.5–35.7)
MCV: 92 fL (ref 79–97)
Monocytes Absolute: 0.6 10*3/uL (ref 0.1–0.9)
Monocytes: 9 %
Neutrophils Absolute: 3.9 10*3/uL (ref 1.4–7.0)
Neutrophils: 62 %
Platelets: 177 10*3/uL (ref 150–450)
RBC: 3.82 x10E6/uL — ABNORMAL LOW (ref 4.14–5.80)
RDW: 12.4 % (ref 11.6–15.4)
WBC: 6.4 10*3/uL (ref 3.4–10.8)

## 2022-03-28 LAB — TSH: TSH: 3.32 u[IU]/mL (ref 0.450–4.500)

## 2022-03-28 LAB — HEMOGLOBIN A1C
Est. average glucose Bld gHb Est-mCnc: 140 mg/dL
Hgb A1c MFr Bld: 6.5 % — ABNORMAL HIGH (ref 4.8–5.6)

## 2022-03-28 LAB — POCT INR: INR: 4.6 — AB (ref 2.0–3.0)

## 2022-03-28 NOTE — Patient Instructions (Signed)
Description   Skip today's dosafe of Warfarin, then resume same dosage of Warfarin 1.5 tablets every day EXCEPT 1 tablet on [place].  Recheck INR in 2 weeks.

## 2022-04-04 ENCOUNTER — Ambulatory Visit (INDEPENDENT_AMBULATORY_CARE_PROVIDER_SITE_OTHER): Payer: PPO

## 2022-04-04 ENCOUNTER — Ambulatory Visit: Payer: PPO | Admitting: Physical Therapy

## 2022-04-04 DIAGNOSIS — I4891 Unspecified atrial fibrillation: Secondary | ICD-10-CM

## 2022-04-04 DIAGNOSIS — Z5181 Encounter for therapeutic drug level monitoring: Secondary | ICD-10-CM | POA: Diagnosis not present

## 2022-04-04 DIAGNOSIS — I482 Chronic atrial fibrillation, unspecified: Secondary | ICD-10-CM

## 2022-04-04 DIAGNOSIS — I4892 Unspecified atrial flutter: Secondary | ICD-10-CM

## 2022-04-04 LAB — POCT INR: INR: 2.9 (ref 2.0–3.0)

## 2022-04-04 NOTE — Patient Instructions (Signed)
resume same dosage of Warfarin 1.5 tablets every day EXCEPT 1 tablet on [place].  Recheck INR in 4 weeks.

## 2022-04-11 ENCOUNTER — Ambulatory Visit: Payer: PPO | Admitting: Physical Therapy

## 2022-04-18 ENCOUNTER — Ambulatory Visit: Payer: PPO | Admitting: Physical Therapy

## 2022-04-20 ENCOUNTER — Other Ambulatory Visit: Payer: Self-pay | Admitting: Family Medicine

## 2022-04-24 ENCOUNTER — Ambulatory Visit: Payer: PPO | Admitting: Physical Therapy

## 2022-04-26 ENCOUNTER — Encounter: Payer: Self-pay | Admitting: *Deleted

## 2022-04-26 ENCOUNTER — Ambulatory Visit: Payer: PPO | Attending: Family Medicine

## 2022-04-26 DIAGNOSIS — M6281 Muscle weakness (generalized): Secondary | ICD-10-CM

## 2022-04-26 DIAGNOSIS — R2681 Unsteadiness on feet: Secondary | ICD-10-CM | POA: Diagnosis not present

## 2022-04-26 DIAGNOSIS — R262 Difficulty in walking, not elsewhere classified: Secondary | ICD-10-CM | POA: Diagnosis not present

## 2022-04-26 DIAGNOSIS — R2689 Other abnormalities of gait and mobility: Secondary | ICD-10-CM | POA: Diagnosis not present

## 2022-04-26 NOTE — Therapy (Signed)
OUTPATIENT PHYSICAL THERAPY TREATMENT NOTE   Patient Name: [name] [name] MRN: [id number] DOB:07-Jul-1934, 88 y.o., male Today's Date: 04/26/2022  PCP: [name], MD REFERRING PROVIDER: [name], MD   PT End of Session - 04/26/22 1204     Visit Number 2    Number of Visits 24    Date for PT Re-Evaluation 06/11/22    Progress Note Due on Visit 10    PT Start Time 2585    PT Stop Time 1230    PT Time Calculation (min) 46 min    Equipment Utilized During Treatment Gait belt    Activity Tolerance Patient tolerated treatment well    Behavior During Therapy [facility] for tasks assessed/performed             Past Medical History:  Diagnosis Date   Aortic stenosis    a. 01/2016 echo: mod AS; b. 04/2018 Cardiac MRI: sev AS; c. 05/2018 s/p mech AVR @ time of myomectomy; d. 07/2018 Echo: Mild AS/AI.   Atherosclerosis of aorta ([place])    by CT scan in past   Atrial fibrillation (HCC)    and aflutter. pt has a left atrial circuit that is not ablated. was on amiodarone-stopped, now use rate control.    Bladder cancer ([place])    hx; treated with BCG in past    Carotid artery disease ([place])    There was calcified plaque but no stenosis by carotid artery screening done at [facility] October, 2013   CHB (complete heart block) [facility])    a. 05/2018 s/p BSX VVIR PPM ([facility]) in setting of bradycardia following myomectomy and AVR/MVR.   Diabetes mellitus    type not specified   Diabetic neuropathy ([place])    feet   Elevated liver enzymes    over time; hx   Excessive sweating    Glaucoma    Gout    Head injury    when slipped on ice 2004-2005. stabilized and back on Coumadin    Headache    migraines - distant past   History of cardiac cath    a. 05/2018 Cath ([facility]): Non-obstructive CAD. Mildly elevated filling pressures w/ nl CI.   HOCM (hypertrophic obstructive cardiomyopathy) ([place])    a. 01/2016 Echo: EF 65-70%, no rwma, LVOT gradient of 80-51mHg, mod AS, SAM; b.  11/2017 Echo: EF 55-60%, near cavity obliteration in systole, mod AS, mild MS; c. 04/2018 Card MRI ([facility]): Sev LVH, EF >70%, Sev AS, LVOT obs w/ Sev MR, mild to mod TR/PR, mid-myocardial HE in basal-mid anteroseptum and inferoseptum; d. 05/2018 s/p Septal myomectomy; e. 07/2018 Echo: EF 60-65%. PASP 329mg.   [place]   elevated, mild    Hypercholesterolemia    treated.    Hypertension    Infection of right inner ear    Kidney mass    a. s/p laproscopic surgery woth cryoablation of a mass outside [facility]: 2cm R kidney mass.   Mediastinal adenopathy    a. 12/208 CT Chest: up to 14m60mLL lung nodule. 1.5-1.7cm subcarinal/mediastinal adenopahty/right paratracheal lymph node; b. 11/2018 CT Chest: 2.5-3cm mediastinal adenopathy.   Mitral regurgitation    a. 04/2018 Cardiac MRI: sev MR; b. 05/2018 s/p mech MVR @ time of myomectomy; c. 07/2018 Echo: Mild MS.   Motion sickness    ocean boats   Nausea    Nonobstructive coronary artery disease    a. 05/2018 Cath ([facility]): nonobs CAD.  Obstructive sleep apnea    CPAP started successfully 2014   Orthostatic hypotension    a. orthostatic. dehydration. hospitalized 11/11   Sleep apnea    Significant obstructive sleep apnea diagnosed in August, 2012, the patient is to receive CPAP    Stroke [facility])    "2 old strokes" CT and MRI. [place] [facility] 11/11. diagnosis was dehydration, no acutal reports.    TSH elevation    on synthroid historically    Unsteady gait    August, 2012   Past Surgical History:  Procedure Laterality Date   BLADDER SURGERY     CATARACT EXTRACTION W/PHACO Left 05/11/2015   Procedure: CATARACT EXTRACTION PHACO AND INTRAOCULAR LENS PLACEMENT ([place]);  Surgeon: Leandrew Koyanagi, MD;  Location: [place];  Service: Ophthalmology;  Laterality: Left;  DIABETIC - oral meds, CPAP   COLONOSCOPY WITH PROPOFOL N/A 04/14/2019   Procedure: COLONOSCOPY WITH PROPOFOL;  Surgeon: [name],  MD;  Location: [facility] ENDOSCOPY;  Service: Endoscopy;  Laterality: N/A;   ESOPHAGOGASTRODUODENOSCOPY (EGD) WITH PROPOFOL N/A 07/01/2018   Procedure: ESOPHAGOGASTRODUODENOSCOPY (EGD) WITH PROPOFOL;  Surgeon: [name], MD;  Location: [facility] ENDOSCOPY;  Service: Endoscopy;  Laterality: N/A;   INSERT / REPLACE / [place]     "froze mass"   MECHANICAL AORTIC AND MITRAL VALVE REPLACEMENT  05/2018   [facility]    TONSILLECTOMY     VALVE REPLACEMENT     Patient Active Problem List   Diagnosis Date Noted   Other abnormalities of gait and mobility 11/15/2021   Depression 11/15/2021   Encounter for fitting and adjustment of hearing aid 11/15/2021   Abnormal CT scan    Abnormal findings on radiological examination of gastrointestinal tract    Polyp of colon    Cancer of right kidney ([place]) 12/10/2018   Pulmonary nodules 12/01/2018   Mediastinal lymphadenopathy 12/01/2018   Heart block AV third degree ([place]) 10/07/2018   Pacemaker 10/07/2018   H/O aortic valve replacement 10/07/2018   Blood in stool    Bleeding duodenal ulcer    GIB (gastrointestinal bleeding) 06/30/2018   Acute upper GI bleed    Decreased activities of daily living (ADL) 06/09/2018   Decreased strength, endurance, and mobility 06/09/2018   Pleural effusion, right 06/09/2018   S/P AVR (aortic valve replacement) 06/08/2018   H/O ventricular septal myectomy 06/08/2018   Chronic anticoagulation 06/02/2018   Heart murmur, systolic 41/28/7867   H/O: rheumatic fever 03/21/2017   Tinnitus 10/30/2016   On warfarin therapy 07/06/2015   Arteriosclerosis of coronary artery 06/13/2015   Carcinoma in situ of bladder 06/13/2015   Diabetes mellitus, type 2 ([place]) 06/13/2015   Diabetic neuropathy ([place]) 06/13/2015   Dizziness 06/13/2015   Essential (primary) hypertension 06/13/2015   Fatigue 06/13/2015   Glaucoma 06/13/2015   Hypercholesteremia 06/13/2015   Male hypogonadism 06/13/2015    Adult hypothyroidism 06/13/2015   Arthritis, degenerative 06/13/2015   CAP (community acquired pneumonia) 06/13/2015   Adenocarcinoma, renal cell ([place]) 06/13/2015   Benign head tremor 12/13/2014   Carotid arterial disease ([place]) 07/12/2014   Decreased cardiac output 07/12/2014   Cardiomyopathy, hypertrophic obstructive ([place]) 07/12/2014   Head injuries 07/12/2014   HOCM (hypertrophic obstructive cardiomyopathy) ([place])    Encounter for therapeutic drug monitoring 11/16/2013   Obstructive sleep apnea    Benign prostatic hypertrophy without urinary obstruction 06/09/2012   History of neoplasm of bladder 06/09/2012   Head injury    Atherosclerosis of aorta ([place])  Hypotension    Stroke ([place])    Unsteady gait    Excessive sweating    Nausea    ORTHOSTATIC HYPOTENSION, HX OF 09/01/2010   Overweight 03/07/2009   MITRAL REGURGITATION 03/05/2009   Chronic atrial fibrillation ([place]) 03/05/2009   Atrial fibrillation and flutter ([place]) 03/05/2009   Mitral and aortic incompetence 03/05/2009      REFERRING DIAG: Imbalance  THERAPY DIAG:  Difficulty in walking, not elsewhere classified  Muscle weakness (generalized)  Other abnormalities of gait and mobility  Unsteadiness on feet  Rationale for Evaluation and Treatment Rehabilitation  PERTINENT HISTORY:  Pt report she has been having balance issues secondary to his ears. Pt was in the artilerary. 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 Pt sold a home in april and has fallen 5 times since then. Pt tripped over a piece of wood in back yard and fell on one occassion. Pt has history of cardiac rehab here at [facility]. Pt has a very difficult time with balance without his cane. He has difficulty with balance when heturns his head.   PRECAUTIONS: Fall  SUBJECTIVE: Pt and wife arrive to the clinic.  Pt notes [place] been [place] the beach for the past [place] which is why he had to cancel his appointments.  Pt  notes he is ready to begin therapy and continue with current POC.  PAIN:  Are you having pain? No     TODAY'S TREATMENT:   10MWT: 12.91 sec 6MWT: 610 feet  Interventions:  Standing hip abduction: 2 x 15 B Standing marches, 2x15 B Standing hip donkey kicks for increased glute activation: 2 x 15 B Standing hamstring curl, 2x15 B Standing heel raises, 2x15  Sitting BP: 142/56 HR: 72  Standing BP: 131/65 HR: 70  PATIENT EDUCATION: Education details: Pt given verbal cuing on proper demonstration of technique during performance of exercises and as part of HEP. Person educated: Patient Education method: Explanation, Demonstration, Tactile cues, and Verbal cues Education comprehension: verbalized understanding, returned demonstration, and verbal cues required   HOME EXERCISE PROGRAM:  Access Code: [place] URL: [email or web address] Date: 04/26/2022 Prepared by: [place]  Exercises - Standing March with Counter Support  - 1 x daily - 7 x weekly - 3 sets - 10 reps - Standing Knee Flexion with Unilateral Counter Support  - 1 x daily - 7 x weekly - 3 sets - 10 reps - Heel Toe Raises with Unilateral Counter Support  - 1 x daily - 7 x weekly - 3 sets - 10 reps - Standing Hip Abduction with Unilateral Counter Support  - 1 x daily - 7 x weekly - 3 sets - 10 reps - Standing Hip Extension with Unilateral Counter Support  - 1 x daily - 7 x weekly - 3 sets - 10 reps   PT Short Term Goals        PT SHORT TERM GOAL #1   Title Patient will be independent in home exercise program to improve strength/mobility for better functional independence with ADLs.    Baseline no HEP    Time 4    Period Weeks    Status New    Target Date 04/16/22              PT Long Term Goals       PT LONG TERM GOAL #1   Title Patient will increase FOTO score to equal to or greater than   48  to demonstrate statistically significant improvement in mobility and quality of life.     Baseline 42    Time 8    Period Weeks    Status New    Target Date 06/11/22      PT LONG TERM GOAL #2   Title Patient  will complete five times sit to stand test in < 17 seconds indicating an increased LE strength and improved balance.    Baseline 23.53 sec    Time 12    Period Weeks    Status New    Target Date 06/11/22      PT LONG TERM GOAL #3   Title Patient will increase Berg Balance score by > 6 points to demonstrate decreased fall risk during functional activities.    Baseline 34    Time 12    Period Weeks    Status New      PT LONG TERM GOAL #4   Title Patient will increase six minute walk test distance to >1000 for progression to community ambulator and improve gait ability    Baseline test session 2    Time 12    Period Weeks    Status New    Target Date 06/11/22      PT LONG TERM GOAL #5   Title Patient will increase 10 meter walk test to >1.72ms as to improve gait speed for better community ambulation and to reduce fall risk.    Baseline to test session 2    Time 12    Period Weeks    Status New    Target Date 06/11/22              Plan     Clinical Impression Statement Pt seen for the first time since going to the beach and put forth good effort throughout session.  Pt performed outcome measures including the 6MWT and 10MWT for goal assessment during future sessions.  Pt also given printed HEP in order to perform exercises that will strengthen the LE's.  Pt to continue with current POC in order to improve overall QoL.    Personal Factors and Comorbidities Age;Comorbidity 1;Comorbidity 2;Comorbidity 3+    Comorbidities CAD, Peripheral neuropathy, T2DM, HTN,    Examination-Activity Limitations Caring for Others;Carry;Locomotion Level;Stairs;Stand    Examination-Participation Restrictions Yard Work;Shop;Community Activity    Stability/Clinical Decision Making Evolving/Moderate complexity    Rehab Potential Fair    PT Frequency 2x / week    PT Duration  12 weeks    PT Treatment/Interventions ADLs/Self Care Home Management;Moist Heat;DME Instruction;Stair training;Functional mobility training;Therapeutic activities;Therapeutic exercise;Balance training;Neuromuscular re-education;Patient/family education;Wheelchair mobility training;Manual techniques;Dry needling;Energy conservation    PT Next Visit Plan test 6MWT and 10MWT, also establish HEP for [place]    Consulted and Agree with Plan of Care Patient;Family member/caregiver              JGwenlyn Saran PT, DPT 04/26/22, 1:03 PM

## 2022-04-28 ENCOUNTER — Other Ambulatory Visit: Payer: Self-pay | Admitting: Internal Medicine

## 2022-04-30 NOTE — Telephone Encounter (Signed)
Refill request

## 2022-05-01 ENCOUNTER — Ambulatory Visit: Payer: PPO

## 2022-05-01 DIAGNOSIS — R262 Difficulty in walking, not elsewhere classified: Secondary | ICD-10-CM

## 2022-05-01 DIAGNOSIS — M6281 Muscle weakness (generalized): Secondary | ICD-10-CM

## 2022-05-01 DIAGNOSIS — R2681 Unsteadiness on feet: Secondary | ICD-10-CM

## 2022-05-01 DIAGNOSIS — R2689 Other abnormalities of gait and mobility: Secondary | ICD-10-CM

## 2022-05-01 NOTE — Therapy (Signed)
OUTPATIENT PHYSICAL THERAPY TREATMENT NOTE   Patient Name: [name] [name] MRN: [id number] DOB:1933-11-30, 88 y.o., male Today's Date: 05/01/2022  PCP: [name], MD REFERRING PROVIDER: [name], MD   PT End of Session - 05/01/22 1441     Visit Number 3    Number of Visits 24    Date for PT Re-Evaluation 06/11/22    Authorization Type [employer]    Authorization Time Period -06/11/22             Past Medical History:  Diagnosis Date   Aortic stenosis    a. 01/2016 echo: mod AS; b. 04/2018 Cardiac MRI: sev AS; c. 05/2018 s/p mech AVR @ time of myomectomy; d. 07/2018 Echo: Mild AS/AI.   Atherosclerosis of aorta ([place])    by CT scan in past   Atrial fibrillation (HCC)    and aflutter. pt has a left atrial circuit that is not ablated. was on amiodarone-stopped, now use rate control.    Bladder cancer ([place])    hx; treated with BCG in past    Carotid artery disease ([place])    There was calcified plaque but no stenosis by carotid artery screening done at [facility] October, 2013   CHB (complete heart block) [facility])    a. 05/2018 s/p BSX VVIR PPM ([facility]) in setting of bradycardia following myomectomy and AVR/MVR.   Diabetes mellitus    type not specified   Diabetic neuropathy ([place])    feet   Elevated liver enzymes    over time; hx   Excessive sweating    Glaucoma    Gout    Head injury    when slipped on ice 2004-2005. stabilized and back on Coumadin    Headache    migraines - distant past   History of cardiac cath    a. 05/2018 Cath ([facility]): Non-obstructive CAD. Mildly elevated filling pressures w/ nl CI.   HOCM (hypertrophic obstructive cardiomyopathy) ([place])    a. 01/2016 Echo: EF 65-70%, no rwma, LVOT gradient of 80-59mHg, mod AS, SAM; b. 11/2017 Echo: EF 55-60%, near cavity obliteration in systole, mod AS, mild MS; c. 04/2018 Card MRI ([facility]): Sev LVH, EF >70%, Sev AS, LVOT obs w/ Sev MR, mild to mod TR/PR, mid-myocardial HE in  basal-mid anteroseptum and inferoseptum; d. 05/2018 s/p Septal myomectomy; e. 07/2018 Echo: EF 60-65%. PASP 335mg.   [place]   elevated, mild    Hypercholesterolemia    treated.    Hypertension    Infection of right inner ear    Kidney mass    a. s/p laproscopic surgery woth cryoablation of a [facility] outside [facility]: 2cm R kidney mass.   Mediastinal adenopathy    a. 12/208 CT Chest: up to 65m62mLL lung nodule. 1.5-1.7cm subcarinal/mediastinal adenopahty/right paratracheal lymph node; b. 11/2018 CT Chest: 2.5-3cm mediastinal adenopathy.   Mitral regurgitation    a. 04/2018 Cardiac MRI: sev MR; b. 05/2018 s/p mech MVR @ time of myomectomy; c. 07/2018 Echo: Mild MS.   Motion sickness    ocean boats   Nausea    Nonobstructive coronary artery disease    a. 05/2018 Cath ([facility]): nonobs CAD.   Obstructive sleep apnea    CPAP started successfully 2014   Orthostatic hypotension    a. orthostatic. dehydration. hospitalized 11/11   Sleep apnea    Significant obstructive sleep apnea diagnosed in August, 2012, the patient is to receive CPAP  Stroke [facility])    "2 old strokes" CT and MRI. [facility] 11/11. diagnosis was dehydration, no acutal reports.    TSH elevation    on synthroid historically    Unsteady gait    August, 2012   Past Surgical History:  Procedure Laterality Date   BLADDER SURGERY     CATARACT EXTRACTION W/PHACO Left 05/11/2015   Procedure: CATARACT EXTRACTION PHACO AND INTRAOCULAR LENS PLACEMENT ([place]);  Surgeon: Leandrew Koyanagi, MD;  Location: [place];  Service: Ophthalmology;  Laterality: Left;  DIABETIC - oral meds, CPAP   COLONOSCOPY WITH PROPOFOL N/A 04/14/2019   Procedure: COLONOSCOPY WITH PROPOFOL;  Surgeon: [name], MD;  Location[facility] ENDOSCOPY;  Service: Endoscopy;  Laterality: N/A;   ESOPHAGOGASTRODUODENOSCOPY (EGD) WITH PROPOFOL N/A 07/01/2018   Procedure: ESOPHAGOGASTRODUODENOSCOPY (EGD) WITH  PROPOFOL;  Surgeon: [name], MD;  Location: [facility] ENDOSCOPY;  Service: Endoscopy;  Laterality: N/A;   INSERT / REPLACE / [place]     "froze mass"   MECHANICAL AORTIC AND MITRAL VALVE REPLACEMENT  05/2018   [facility]    TONSILLECTOMY     VALVE REPLACEMENT     Patient Active Problem List   Diagnosis Date Noted   Other abnormalities of gait and mobility 11/15/2021   Depression 11/15/2021   Encounter for fitting and adjustment of hearing aid 11/15/2021   Abnormal CT scan    Abnormal findings on radiological examination of gastrointestinal tract    Polyp of colon    Cancer of right kidney ([place]) 12/10/2018   Pulmonary nodules 12/01/2018   Mediastinal lymphadenopathy 12/01/2018   Heart block AV third degree ([place]) 10/07/2018   Pacemaker 10/07/2018   H/O aortic valve replacement 10/07/2018   Blood in stool    Bleeding duodenal ulcer    GIB (gastrointestinal bleeding) 06/30/2018   Acute upper GI bleed    Decreased activities of daily living (ADL) 06/09/2018   Decreased strength, endurance, and mobility 06/09/2018   Pleural effusion, right 06/09/2018   S/P AVR (aortic valve replacement) 06/08/2018   H/O ventricular septal myectomy 06/08/2018   Chronic anticoagulation 06/02/2018   Heart murmur, systolic 18/84/1660   H/O: rheumatic fever 03/21/2017   Tinnitus 10/30/2016   On warfarin therapy 07/06/2015   Arteriosclerosis of coronary artery 06/13/2015   Carcinoma in situ of bladder 06/13/2015   Diabetes mellitus, type 2 ([place]) 06/13/2015   Diabetic neuropathy ([place]) 06/13/2015   Dizziness 06/13/2015   Essential (primary) hypertension 06/13/2015   Fatigue 06/13/2015   Glaucoma 06/13/2015   Hypercholesteremia 06/13/2015   Male hypogonadism 06/13/2015   Adult hypothyroidism 06/13/2015   Arthritis, degenerative 06/13/2015   CAP (community acquired pneumonia) 06/13/2015   Adenocarcinoma, renal cell ([place]) 06/13/2015   Benign head tremor  12/13/2014   Carotid arterial disease ([place]) 07/12/2014   Decreased cardiac output 07/12/2014   Cardiomyopathy, hypertrophic obstructive ([place]) 07/12/2014   Head injuries 07/12/2014   HOCM (hypertrophic obstructive cardiomyopathy) ([place])    Encounter for therapeutic drug monitoring 11/16/2013   Obstructive sleep apnea    Benign prostatic hypertrophy without urinary obstruction 06/09/2012   History of neoplasm of bladder 06/09/2012   Head injury    Atherosclerosis of aorta (HCC)    Hypotension    Stroke ([place])    Unsteady gait    Excessive sweating    Nausea    ORTHOSTATIC HYPOTENSION, HX OF 09/01/2010   Overweight 03/07/2009   MITRAL REGURGITATION 03/05/2009   Chronic atrial fibrillation ([place]) 03/05/2009  Atrial fibrillation and flutter ([place]) 03/05/2009   Mitral and aortic incompetence 03/05/2009      REFERRING DIAG: Imbalance  THERAPY DIAG:  No diagnosis found.  Rationale for Evaluation and Treatment Rehabilitation  PERTINENT HISTORY:  Pt report she has been having balance issues secondary to his ears. Pt was in the artilerary. 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 Pt sold a home in april and has fallen 5 times since then. Pt tripped over a piece of wood in back yard and fell on one occassion. Pt has history of cardiac rehab [facility] at [facility]. Pt has a very difficult time with balance without his cane. He has difficulty with balance when heturns his head.   PRECAUTIONS: Fall  SUBJECTIVE: Pt doing well. No falls since last PT session. Still has not begun HEP, but will do so next day.  PAIN:  Are you having pain? No     TODAY'S TREATMENT:   Orthostatic vital signs assessment: Supine 147/62 43mHg, 130/66 758mg Seated 132/23 77bpm (no symptoms)  Standing 132/56 7584m (mild brief dizziness)  Standing 139/59 89m44m(symptom free)   Phase 1: -STS from elevated surface 1x5 (cues for full height standing, cues for hands on knees  to promote forward trunk position)  -seated marching 1x30 alternating sides (elevated surface)  -STS from elevated surface 1x5 (cues for full height standing, cues for hands on knees to promote forward trunk position)  -seated marching 1x30 alternating sides (elevated surface)   Phase 2: -seated ankle DF 1x15 @ 2lbAW  -seated BLE 1x15 yellow TB  -seated ankle DF 1x15 @ 2lbAW  -seated BLE 1x15 yellow TB   Phase 3: -The Step fwd stepup, fwd step down 1x12 c 180 degree turnaround x6, SPC RUE; cane and step strategy ad lib Minguard assist, 2 mild posterior lob upon rising able to self stabilize with ankle DF righting strategy    PATIENT EDUCATION: Education details: Pt given verbal cuing on proper demonstration of technique during performance of exercises and as part of HEP. Person educated: Patient Education method: Explanation, Demonstration, Tactile cues, and Verbal cues Education comprehension: verbalized understanding, returned demonstration, and verbal cues required   HOME EXERCISE PROGRAM:  Access Code: [email or web address]: [email or web address] Date: 04/26/2022 Prepared by: JoshRaleigh Nationercises - Standing March with Counter Support  - 1 x daily - 7 x weekly - 3 sets - 10 reps - Standing Knee Flexion with Unilateral Counter Support  - 1 x daily - 7 x weekly - 3 sets - 10 reps - Heel Toe Raises with Unilateral Counter Support  - 1 x daily - 7 x weekly - 3 sets - 10 reps - Standing Hip Abduction with Unilateral Counter Support  - 1 x daily - 7 x weekly - 3 sets - 10 reps - Standing Hip Extension with Unilateral Counter Support  - 1 x daily - 7 x weekly - 3 sets - 10 reps   PT Short Term Goals        PT SHORT TERM GOAL #1   Title Patient will be independent in home exercise program to improve strength/mobility for better functional independence with ADLs.    Baseline no HEP    Time 4    Period Weeks    Status New    Target Date 04/16/22              PT  Long Term Goals  PT LONG TERM GOAL #1   Title Patient will increase FOTO score to equal to or greater than   48  to demonstrate statistically significant improvement in mobility and quality of life.    Baseline 42    Time 8    Period Weeks    Status New    Target Date 06/11/22      PT LONG TERM GOAL #2   Title Patient  will complete five times sit to stand test in < 17 seconds indicating an increased LE strength and improved balance.    Baseline 23.53 sec    Time 12    Period Weeks    Status New    Target Date 06/11/22      PT LONG TERM GOAL #3   Title Patient will increase Berg Balance score by > 6 points to demonstrate decreased fall risk during functional activities.    Baseline 34    Time 12    Period Weeks    Status New      PT LONG TERM GOAL #4   Title Patient will increase six minute walk test distance to >1000 for progression to community ambulator and improve gait ability    Baseline test session 2    Time 12    Period Weeks    Status New    Target Date 06/11/22      PT LONG TERM GOAL #5   Title Patient will increase 10 meter walk test to >1.25ms as to improve gait speed for better community ambulation and to reduce fall risk.    Baseline to test session 2    Time 12    Period Weeks    Status New    Target Date 06/11/22              Plan     Clinical Impression Statement Continued with current plan of care as laid out in evaluation and recent prior session. Started with full orthostatic vitals this session given his mild orthostatic hypotension last session- negative today, although he has persistent HR stasis which may account for intermittent orthostatic dizziness.  All interventions tolerated as expected by author. Mobility and strength continue to progress as anticipated. Recovery intervals given as needed based on signs of exertion and/or pt request. Pt educated on best technique for each intervention- author uses verbal, visual, tactile cues to  optimize learning. Author takes steps to maximize patient independence when appropriate. Pt remains highly motivated. Pt closely monitored throughout session for safe activity response, as well as to maximize patient safety during interventions. The patient's therapy prognosis indicates continued potential for improvement, anticipate that future progress is attainable in a reasonable/predictable timeframe. Maximum improvement is within reach. Pt will continue to benefit from skilled PT services to address deficits and impairment identified in evaluation in order to maximize independence and safety in basic mobility required for performance of ADL, IADL, and leisure.    Personal Factors and Comorbidities Age;Comorbidity 1;Comorbidity 2;Comorbidity 3+    Comorbidities CAD, Peripheral neuropathy, T2DM, HTN,    Examination-Activity Limitations Caring for Others;Carry;Locomotion Level;Stairs;Stand    Examination-Participation Restrictions Yard Work;Shop;Community Activity    Stability/Clinical Decision Making Evolving/Moderate complexity    Rehab Potential Fair    PT Frequency 2x / week    PT Duration 12 weeks    PT Treatment/Interventions ADLs/Self Care Home Management;Moist Heat;DME Instruction;Stair training;Functional mobility training;Therapeutic activities;Therapeutic exercise;Balance training;Neuromuscular re-education;Patient/family education;Wheelchair mobility training;Manual techniques;Dry needling;Energy conservation    PT Next Visit Plan test 6MWT and  10MWT, also establish HEP for 2 weeks pt will be at [place] to establish next session    Consulted and Agree with Plan of Care Patient;Family member/caregiver             2:57 PM, 05/01/22 Etta Grandchild, PT, DPT Physical Therapist - [place] [phone]     05/01/22, 2:57 PM

## 2022-05-02 ENCOUNTER — Ambulatory Visit (INDEPENDENT_AMBULATORY_CARE_PROVIDER_SITE_OTHER): Payer: PPO

## 2022-05-02 DIAGNOSIS — Z5181 Encounter for therapeutic drug level monitoring: Secondary | ICD-10-CM | POA: Diagnosis not present

## 2022-05-02 DIAGNOSIS — I4892 Unspecified atrial flutter: Secondary | ICD-10-CM

## 2022-05-02 DIAGNOSIS — I482 Chronic atrial fibrillation, unspecified: Secondary | ICD-10-CM | POA: Diagnosis not present

## 2022-05-02 DIAGNOSIS — I4891 Unspecified atrial fibrillation: Secondary | ICD-10-CM

## 2022-05-02 LAB — POCT INR: INR: 3.6 — AB (ref 2.0–3.0)

## 2022-05-02 NOTE — Patient Instructions (Signed)
resume same dosage of Warfarin 1.5 tablets every day EXCEPT 1 tablet on [place].  Recheck INR in 4 weeks. Eat greens tonight.

## 2022-05-03 ENCOUNTER — Encounter: Payer: Self-pay | Admitting: Physical Therapy

## 2022-05-03 ENCOUNTER — Telehealth: Payer: Self-pay | Admitting: Cardiovascular Disease

## 2022-05-03 ENCOUNTER — Ambulatory Visit: Payer: PPO | Admitting: Physical Therapy

## 2022-05-03 DIAGNOSIS — R2689 Other abnormalities of gait and mobility: Secondary | ICD-10-CM

## 2022-05-03 DIAGNOSIS — R2681 Unsteadiness on feet: Secondary | ICD-10-CM

## 2022-05-03 DIAGNOSIS — R262 Difficulty in walking, not elsewhere classified: Secondary | ICD-10-CM | POA: Diagnosis not present

## 2022-05-03 DIAGNOSIS — M6281 Muscle weakness (generalized): Secondary | ICD-10-CM

## 2022-05-03 NOTE — Therapy (Signed)
OUTPATIENT PHYSICAL THERAPY TREATMENT NOTE   Patient Name: [name] [name] MRN: [id number] DOB:1934-02-28, 88 y.o., male Today's Date: 05/03/2022  PCP: [name], MD REFERRING PROVIDER: [name], MD   PT End of Session - 05/03/22 1156     Visit Number 4    Number of Visits 24    Date for PT Re-Evaluation 06/11/22    Progress Note Due on Visit 10    PT Start Time 5035    PT Stop Time 1230    PT Time Calculation (min) 35 min    Equipment Utilized During Treatment Gait belt    Activity Tolerance Patient tolerated treatment well    Behavior During Therapy [facility] for tasks assessed/performed             Past Medical History:  Diagnosis Date   Aortic stenosis    a. 01/2016 echo: mod AS; b. 04/2018 Cardiac MRI: sev AS; c. 05/2018 s/p mech AVR @ time of myomectomy; d. 07/2018 Echo: Mild AS/AI.   Atherosclerosis of aorta ([place])    by CT scan in past   Atrial fibrillation (HCC)    and aflutter. pt has a left atrial circuit that is not ablated. was on amiodarone-stopped, now use rate control.    Bladder cancer ([place])    hx; treated with BCG in past    Carotid artery disease ([place])    There was calcified plaque but no stenosis by carotid artery screening done at [facility] October, 2013   CHB (complete heart block) [facility])    a. 05/2018 s/p BSX VVIR PPM ([facility]) in setting of bradycardia following myomectomy and AVR/MVR.   Diabetes mellitus    type not specified   Diabetic neuropathy ([place])    feet   Elevated liver enzymes    over time; hx   Excessive sweating    Glaucoma    Gout    Head injury    when slipped on ice 2004-2005. stabilized and back on Coumadin    Headache    migraines - distant past   History of cardiac cath    a. 05/2018 Cath ([facility]): Non-obstructive CAD. Mildly elevated filling pressures w/ nl CI.   HOCM (hypertrophic obstructive cardiomyopathy) ([place])    a. 01/2016 Echo: EF 65-70%, no rwma, LVOT gradient of 80-59mHg, mod AS, SAM; b.  11/2017 Echo: EF 55-60%, near cavity obliteration in systole, mod AS, mild MS; c. 04/2018 Card MRI ([facility]): Sev LVH, EF >70%, Sev AS, LVOT obs w/ Sev MR, mild to mod TR/PR, mid-myocardial HE in basal-mid anteroseptum and inferoseptum; d. 05/2018 s/p Septal myomectomy; e. 07/2018 Echo: EF 60-65%. PASP 363mg.   [place]ocystinemia [place]   elevated, mild    Hypercholesterolemia    treated.    Hypertension    Infection of right inner ear    Kidney mass    a. s/p laproscopic surgery woth cryoablation of a mass outside [facility]: 2cm R kidney mass.   Mediastinal adenopathy    a. 12/208 CT Chest: up to 78m60mLL lung nodule. 1.5-1.7cm subcarinal/mediastinal adenopahty/right paratracheal lymph node; b. 11/2018 CT Chest: 2.5-3cm mediastinal adenopathy.   Mitral regurgitation    a. 04/2018 Cardiac MRI: sev MR; b. 05/2018 s/p mech MVR @ time of myomectomy; c. 07/2018 Echo: Mild MS.   Motion sickness    ocean boats   Nausea    Nonobstructive coronary artery disease    a. 05/2018 Cath ([facility]): nonobs CAD.  Obstructive sleep apnea    CPAP started successfully 2014   Orthostatic hypotension    a. orthostatic. dehydration. hospitalized 11/11   Sleep apnea    Significant obstructive sleep apnea diagnosed in August, 2012, the patient is to receive CPAP    Stroke [facility])    "2 old strokes" CT and MRI. [facility] 11/11. diagnosis was dehydration, no acutal reports.    TSH elevation    on synthroid historically    Unsteady gait    August, 2012   Past Surgical History:  Procedure Laterality Date   BLADDER SURGERY     CATARACT EXTRACTION W/PHACO Left 05/11/2015   Procedure: CATARACT EXTRACTION PHACO AND INTRAOCULAR LENS PLACEMENT ([place]);  Surgeon: Leandrew Koyanagi, MD;  Location: [facility];  Service: Ophthalmology;  Laterality: Left;  DIABETIC - oral meds, CPAP   COLONOSCOPY WITH PROPOFOL N/A 04/14/2019   Procedure: COLONOSCOPY WITH PROPOFOL;  Surgeon: [name],  MD;  Location[facility] ENDOSCOPY;  Service: Endoscopy;  Laterality: N/A;   ESOPHAGOGASTRODUODENOSCOPY (EGD) WITH PROPOFOL N/A 07/01/2018   Procedure: ESOPHAGOGASTRODUODENOSCOPY (EGD) WITH PROPOFOL;  Surgeon: [name], MD;  Location: [facility] ENDOSCOPY;  Service: Endoscopy;  Laterality: N/A;   INSERT / REPLACE / [place]     "froze mass"   MECHANICAL AORTIC AND MITRAL VALVE REPLACEMENT  05/2018   [facility]    TONSILLECTOMY     VALVE REPLACEMENT     Patient Active Problem List   Diagnosis Date Noted   Other abnormalities of gait and mobility 11/15/2021   Depression 11/15/2021   Encounter for fitting and adjustment of hearing aid 11/15/2021   Abnormal CT scan    Abnormal findings on radiological examination of gastrointestinal tract    Polyp of colon    Cancer of right kidney ([place]) 12/10/2018   Pulmonary nodules 12/01/2018   Mediastinal lymphadenopathy 12/01/2018   Heart block AV third degree ([place]) 10/07/2018   Pacemaker 10/07/2018   H/O aortic valve replacement 10/07/2018   Blood in stool    Bleeding duodenal ulcer    GIB (gastrointestinal bleeding) 06/30/2018   Acute upper GI bleed    Decreased activities of daily living (ADL) 06/09/2018   Decreased strength, endurance, and mobility 06/09/2018   Pleural effusion, right 06/09/2018   S/P AVR (aortic valve replacement) 06/08/2018   H/O ventricular septal myectomy 06/08/2018   Chronic anticoagulation 06/02/2018   Heart murmur, systolic 41/93/7902   H/O: rheumatic fever 03/21/2017   Tinnitus 10/30/2016   On warfarin therapy 07/06/2015   Arteriosclerosis of coronary artery 06/13/2015   Carcinoma in situ of bladder 06/13/2015   Diabetes mellitus, type 2 ([place]) 06/13/2015   Diabetic neuropathy ([place]) 06/13/2015   Dizziness 06/13/2015   Essential (primary) hypertension 06/13/2015   Fatigue 06/13/2015   Glaucoma 06/13/2015   Hypercholesteremia 06/13/2015   Male hypogonadism 06/13/2015    Adult hypothyroidism 06/13/2015   Arthritis, degenerative 06/13/2015   CAP (community acquired pneumonia) 06/13/2015   Adenocarcinoma, renal cell ([place]) 06/13/2015   Benign head tremor 12/13/2014   Carotid arterial disease ([place]) 07/12/2014   Decreased cardiac output 07/12/2014   Cardiomyopathy, hypertrophic obstructive ([place]) 07/12/2014   Head injuries 07/12/2014   HOCM (hypertrophic obstructive cardiomyopathy) ([place])    Encounter for therapeutic drug monitoring 11/16/2013   Obstructive sleep apnea    Benign prostatic hypertrophy without urinary obstruction 06/09/2012   History of neoplasm of bladder 06/09/2012   Head injury    Atherosclerosis of aorta ([place])  Hypotension    Stroke ([place])    Unsteady gait    Excessive sweating    Nausea    ORTHOSTATIC HYPOTENSION, HX OF 09/01/2010   Overweight 03/07/2009   MITRAL REGURGITATION 03/05/2009   Chronic atrial fibrillation ([place]) 03/05/2009   Atrial fibrillation and flutter ([place]) 03/05/2009   Mitral and aortic incompetence 03/05/2009      REFERRING DIAG: Imbalance  THERAPY DIAG:  No diagnosis found.  Rationale for Evaluation and Treatment Rehabilitation  PERTINENT HISTORY:  Pt report she has been having balance issues secondary to his ears. Pt was in the artilerary. 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 Pt sold a home in april and has fallen 5 times since then. Pt tripped over a piece of wood in back yard and fell on one occassion. Pt has history of cardiac rehab here at [facility]. Pt has a very difficult time with balance without his cane. He has difficulty with balance when heturns his head.   PRECAUTIONS: Fall  SUBJECTIVE: Pt doing well. Denies any new falls in last week;   Presents to therapy with his wife;  PAIN:  Are you having pain? No     TODAY'S TREATMENT:   Orthostatic vital signs assessment: Seated 111/61 Standing 135/56 -denies any symptoms;   Standing with 2# ankle  weight: -alternate march x10 reps -hip abduction SLR x10 reps each LE- cues to keep foot oriented forward to isolate hip strengthening; -hamstring curl x10 reps; -heel/toe raises x10 reps with cues to avoid trunk movement to isolate ankle strengthening;   Pt reports feeling dizzy- BP re-assessed 159/65  Seated Hip flexion/abduction/adduction (lifting LE over orange hurdle in sitting) 2# x12 reps each direction;  Forward/backward walking x10 feet 2# ankle weight x3 laps with 1 rail assist;  Patient tolerated session fair. He reports intermittent dizziness throughout session. Reports minimal fatigue;      PATIENT EDUCATION: Education details: Pt given verbal cuing on proper demonstration of technique during performance of exercises and as part of HEP. Person educated: Patient Education method: Explanation, Demonstration, Tactile cues, and Verbal cues Education comprehension: verbalized understanding, returned demonstration, and verbal cues required   HOME EXERCISE PROGRAM:  Access Code: [email or web address] URL: [email or web address] Date: 04/26/2022 Prepared by: [place]  Exercises - Standing March with Counter Support  - 1 x daily - 7 x weekly - 3 sets - 10 reps - Standing Knee Flexion with Unilateral Counter Support  - 1 x daily - 7 x weekly - 3 sets - 10 reps - Heel Toe Raises with Unilateral Counter Support  - 1 x daily - 7 x weekly - 3 sets - 10 reps - Standing Hip Abduction with Unilateral Counter Support  - 1 x daily - 7 x weekly - 3 sets - 10 reps - Standing Hip Extension with Unilateral Counter Support  - 1 x daily - 7 x weekly - 3 sets - 10 reps   PT Short Term Goals        PT SHORT TERM GOAL #1   Title Patient will be independent in home exercise program to improve strength/mobility for better functional independence with ADLs.    Baseline no HEP    Time 4    Period Weeks    Status New    Target Date 04/16/22              PT Long Term Goals        PT LONG TERM  GOAL #1   Title Patient will increase FOTO score to equal to or greater than   48  to demonstrate statistically significant improvement in mobility and quality of life.    Baseline 42    Time 8    Period Weeks    Status New    Target Date 06/11/22      PT LONG TERM GOAL #2   Title Patient  will complete five times sit to stand test in < 17 seconds indicating an increased LE strength and improved balance.    Baseline 23.53 sec    Time 12    Period Weeks    Status New    Target Date 06/11/22      PT LONG TERM GOAL #3   Title Patient will increase Berg Balance score by > 6 points to demonstrate decreased fall risk during functional activities.    Baseline 34    Time 12    Period Weeks    Status New      PT LONG TERM GOAL #4   Title Patient will increase six minute walk test distance to >1000 for progression to community ambulator and improve gait ability    Baseline test session 2    Time 12    Period Weeks    Status New    Target Date 06/11/22      PT LONG TERM GOAL #5   Title Patient will increase 10 meter walk test to >1.66ms as to improve gait speed for better community ambulation and to reduce fall risk.    Baseline to test session 2    Time 12    Period Weeks    Status New    Target Date 06/11/22              Plan     Clinical Impression Statement Pt late to session; Patient motivated and participated well within session; He was instructed in advanced LE strengthening. Utilized ankle weight for resistance. Patient's vitals monitored throughout session. Patient does report dizziness throughout session but did not exhibit any hypotension this session. Patient would benefit from additional skilled PT intervention to improve strength, balance and gait safety;    Personal Factors and Comorbidities Age;Comorbidity 1;Comorbidity 2;Comorbidity 3+    Comorbidities CAD, Peripheral neuropathy, T2DM, HTN,    Examination-Activity Limitations Caring for  Others;Carry;Locomotion Level;Stairs;Stand    Examination-Participation Restrictions Yard Work;Shop;Community Activity    Stability/Clinical Decision Making Evolving/Moderate complexity    Rehab Potential Fair    PT Frequency 2x / week    PT Duration 12 weeks    PT Treatment/Interventions ADLs/Self Care Home Management;Moist Heat;DME Instruction;Stair training;Functional mobility training;Therapeutic activities;Therapeutic exercise;Balance training;Neuromuscular re-education;Patient/family education;Wheelchair mobility training;Manual techniques;Dry needling;Energy conservation    PT Next Visit Plan test 6MWT and 10MWT, also establish HEP for 2 [place]    Consulted and Agree with Plan of Care Patient;Family member/caregiver              MHillis Range PT, DPT 05/03/22 1:30 PM   05/03/22, 1:30 PM

## 2022-05-03 NOTE — Telephone Encounter (Signed)
Refill Request.  

## 2022-05-03 NOTE — Telephone Encounter (Signed)
*  STAT* If patient is at the pharmacy, call can be transferred to refill team.   1. Which medications need to be refilled? (please list name of each medication and dose if known)  warfarin (COUMADIN) 3 MG tablet  2. Which pharmacy/location (including street and city if local pharmacy) is medication to be sent to?[place], [place]  3. Do they need a 30 day or 90 day supply? 90 day  Patient only has 1 day left.

## 2022-05-04 ENCOUNTER — Other Ambulatory Visit: Payer: Self-pay

## 2022-05-04 MED ORDER — WARFARIN SODIUM 3 MG PO TABS: ORAL_TABLET | ORAL | 0 refills | Status: DC

## 2022-05-07 DIAGNOSIS — H90A21 Sensorineural hearing loss, unilateral, right ear, with restricted hearing on the contralateral side: Secondary | ICD-10-CM | POA: Diagnosis not present

## 2022-05-07 DIAGNOSIS — R42 Dizziness and giddiness: Secondary | ICD-10-CM | POA: Diagnosis not present

## 2022-05-07 DIAGNOSIS — H9319 Tinnitus, unspecified ear: Secondary | ICD-10-CM | POA: Diagnosis not present

## 2022-05-08 ENCOUNTER — Encounter: Payer: Self-pay | Admitting: Physical Therapy

## 2022-05-08 ENCOUNTER — Ambulatory Visit: Payer: PPO | Admitting: Physical Therapy

## 2022-05-08 DIAGNOSIS — Z950 Presence of cardiac pacemaker: Secondary | ICD-10-CM | POA: Diagnosis not present

## 2022-05-08 DIAGNOSIS — R262 Difficulty in walking, not elsewhere classified: Secondary | ICD-10-CM | POA: Diagnosis not present

## 2022-05-08 DIAGNOSIS — R2681 Unsteadiness on feet: Secondary | ICD-10-CM

## 2022-05-08 DIAGNOSIS — R2689 Other abnormalities of gait and mobility: Secondary | ICD-10-CM

## 2022-05-08 NOTE — Therapy (Signed)
OUTPATIENT PHYSICAL THERAPY TREATMENT NOTE   Patient Name: [name] [name] MRN: [id number] DOB:25-Mar-1934, 88 y.o., male Today's Date: 05/08/2022  PCP: [name], MD REFERRING PROVIDER: [name], MD   PT End of Session - 05/08/22 1351     Visit Number 5    Number of Visits 24    Date for PT Re-Evaluation 06/11/22    Progress Note Due on Visit 10    PT Start Time 1351    PT Stop Time 1430    PT Time Calculation (min) 39 min    Equipment Utilized During Treatment Gait belt    Activity Tolerance Patient tolerated treatment well    Behavior During Therapy [facility] for tasks assessed/performed             Past Medical History:  Diagnosis Date   Aortic stenosis    a. 01/2016 echo: mod AS; b. 04/2018 Cardiac MRI: sev AS; c. 05/2018 s/p mech AVR @ time of myomectomy; d. 07/2018 Echo: Mild AS/AI.   Atherosclerosis of aorta ([place])    by CT scan in past   Atrial fibrillation (HCC)    and aflutter. pt has a left atrial circuit that is not ablated. was on amiodarone-stopped, now use rate control.    Bladder cancer ([place])    hx; treated with BCG in past    Carotid artery disease ([place])    There was calcified plaque but no stenosis by carotid artery screening [facility] October, 2013   CHB (complete heart block) [facility])    a. 05/2018 s/p BSX VVIR PPM ([facility]) in setting of bradycardia following myomectomy and AVR/MVR.   Diabetes mellitus    type not specified   Diabetic neuropathy ([place])    feet   Elevated liver enzymes    over time; hx   Excessive sweating    Glaucoma    Gout    Head injury    when slipped on ice 2004-2005. stabilized and back on Coumadin    Headache    migraines - distant past   History of cardiac cath    a. 05/2018 Cath ([facility]): Non-obstructive CAD. Mildly elevated filling pressures w/ nl CI.   HOCM (hypertrophic obstructive cardiomyopathy) ([place])    a. 01/2016 Echo: EF 65-70%, no rwma, LVOT gradient of 80-57mHg, mod AS, SAM; b.  11/2017 Echo: EF 55-60%, near cavity obliteration in systole, mod AS, mild MS; c. 04/2018 Card MRI ([facility]): Sev LVH, EF >70%, Sev AS, LVOT obs w/ Sev MR, mild to mod TR/PR, mid-myocardial HE in basal-mid anteroseptum and inferoseptum; d. 05/2018 s/p Septal myomectomy; e. 07/2018 Echo: EF 60-65%. PASP 367mg.   Homocystinemia ([place]   elevated, mild    Hypercholesterolemia    treated.    Hypertension    Infection of right inner ear    Kidney mass    a. s/p laproscopic surgery woth cryoablation of a mass outside [facility]2018 CT Chest: 2cm R kidney mass.   Mediastinal adenopathy    a. 12/208 CT Chest: up to 95m60mLL lung nodule. 1.5-1.7cm subcarinal/mediastinal adenopahty/right paratracheal lymph node; b. 11/2018 CT Chest: 2.5-3cm mediastinal adenopathy.   Mitral regurgitation    a. 04/2018 Cardiac MRI: sev MR; b. 05/2018 s/p mech MVR @ time of myomectomy; c. 07/2018 Echo: Mild MS.   Motion sickness    ocean boats   Nausea    Nonobstructive coronary artery disease    a. 05/2018 Cath ([facility]): nonobs CAD.  Obstructive sleep apnea    CPAP started successfully 2014   Orthostatic hypotension    a. orthostatic. dehydration. hospitalized 11/11   Sleep apnea    Significant obstructive sleep apnea diagnosed in August, 2012, the patient is to receive CPAP    Stroke [facility])    "2 old strokes" CT and MRI. [facility] 11/11. diagnosis was dehydration, no acutal reports.    TSH elevation    on synthroid historically    Unsteady gait    August, 2012   Past Surgical History:  Procedure Laterality Date   BLADDER SURGERY     CATARACT EXTRACTION W/PHACO Left 05/11/2015   Procedure: CATARACT EXTRACTION PHACO AND INTRAOCULAR LENS PLACEMENT ([place]);  Surgeon: Leandrew Koyanagi, MD;  Location: [facility];  Service: Ophthalmology;  Laterality: Left;  DIABETIC - oral meds, CPAP   COLONOSCOPY WITH PROPOFOL N/A 04/14/2019   Procedure: COLONOSCOPY WITH PROPOFOL;  Surgeon: [name],  MD;  Location: [facility] ENDOSCOPY;  Service: Endoscopy;  Laterality: N/A;   ESOPHAGOGASTRODUODENOSCOPY (EGD) WITH PROPOFOL N/A 07/01/2018   Procedure: ESOPHAGOGASTRODUODENOSCOPY (EGD) WITH PROPOFOL;  Surgeon: [name], MD;  Location: [facility] ENDOSCOPY;  Service: Endoscopy;  Laterality: N/A;   INSERT / REPLACE / [place]     "froze mass"   MECHANICAL AORTIC AND MITRAL VALVE REPLACEMENT  05/2018   [facility]    TONSILLECTOMY     VALVE REPLACEMENT     Patient Active Problem List   Diagnosis Date Noted   Other abnormalities of gait and mobility 11/15/2021   Depression 11/15/2021   Encounter for fitting and adjustment of hearing aid 11/15/2021   Abnormal CT scan    Abnormal findings on radiological examination of gastrointestinal tract    Polyp of colon    Cancer of right kidney ([place]) 12/10/2018   Pulmonary nodules 12/01/2018   Mediastinal lymphadenopathy 12/01/2018   Heart block AV third degree ([place]) 10/07/2018   Pacemaker 10/07/2018   H/O aortic valve replacement 10/07/2018   Blood in stool    Bleeding duodenal ulcer    GIB (gastrointestinal bleeding) 06/30/2018   Acute upper GI bleed    Decreased activities of daily living (ADL) 06/09/2018   Decreased strength, endurance, and mobility 06/09/2018   Pleural effusion, right 06/09/2018   S/P AVR (aortic valve replacement) 06/08/2018   H/O ventricular septal myectomy 06/08/2018   Chronic anticoagulation 06/02/2018   Heart murmur, systolic 71/69/6789   H/O: rheumatic fever 03/21/2017   Tinnitus 10/30/2016   On warfarin therapy 07/06/2015   Arteriosclerosis of coronary artery 06/13/2015   Carcinoma in situ of bladder 06/13/2015   Diabetes mellitus, type 2 ([place]) 06/13/2015   Diabetic neuropathy ([place]) 06/13/2015   Dizziness 06/13/2015   Essential (primary) hypertension 06/13/2015   Fatigue 06/13/2015   Glaucoma 06/13/2015   Hypercholesteremia 06/13/2015   Male hypogonadism 06/13/2015    Adult hypothyroidism 06/13/2015   Arthritis, degenerative 06/13/2015   CAP (community acquired pneumonia) 06/13/2015   Adenocarcinoma, renal cell ([place]) 06/13/2015   Benign head tremor 12/13/2014   Carotid arterial disease ([place]) 07/12/2014   Decreased cardiac output 07/12/2014   Cardiomyopathy, hypertrophic obstructive ([place]) 07/12/2014   Head injuries 07/12/2014   HOCM (hypertrophic obstructive cardiomyopathy) ([place])    Encounter for therapeutic drug monitoring 11/16/2013   Obstructive sleep apnea    Benign prostatic hypertrophy without urinary obstruction 06/09/2012   History of neoplasm of bladder 06/09/2012   Head injury    Atherosclerosis of aorta ([place])  Hypotension    Stroke ([place])    Unsteady gait    Excessive sweating    Nausea    ORTHOSTATIC HYPOTENSION, HX OF 09/01/2010   Overweight 03/07/2009   MITRAL REGURGITATION 03/05/2009   Chronic atrial fibrillation ([place]) 03/05/2009   Atrial fibrillation and flutter ([place]) 03/05/2009   Mitral and aortic incompetence 03/05/2009      REFERRING DIAG: Imbalance  THERAPY DIAG:  Difficulty in walking, not elsewhere classified  Other abnormalities of gait and mobility  Unsteadiness on feet  Rationale for Evaluation and Treatment Rehabilitation  PERTINENT HISTORY:  Pt report she has been having balance issues secondary to his ears. Pt was in the artilerary. 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 Pt sold a home in april and has fallen 5 times since then. Pt tripped over a piece of wood in back yard and fell on one occassion. Pt has history of cardiac rehab here at [facility]. Pt has a very difficult time with balance without his cane. He has difficulty with balance when heturns his head.   PRECAUTIONS: Fall  SUBJECTIVE: Pt doing well. Denies any new falls in last week;   Presents to therapy with his wife;  PAIN:  Are you having pain? No     TODAY'S TREATMENT:   TE  Nustep level 3 x  2 sets x 3 min   Standing with 2# ankle weight: -alternate march 2x10 reps -hip abduction SLR 2x10 reps each LE- cues to keep foot oriented forward to isolate hip strengthening; -hamstring curl 2x10 reps; -heel/toe raises 2x15 reps with cues to avoid trunk movement to isolate ankle strengthening;   - Ambulation with 2.5# AW x 160 feet with Hurri Cane   - mild lightheadedness felt at [place] point, no LOB noted as result but did have decreased speed.       NMR Standing with narrow base of support on Airex pad 3 x 30 seconds.  Patient has tendency to lose balance posteriorly but no loss of balance occurred requiring upper extremity assist.  1 lower extremity on floor with lower extremity on step x30 seconds each lower extremity       PATIENT EDUCATION: Education details: Pt given verbal cuing on proper demonstration of technique during performance of exercises and as part of HEP. Person educated: Patient Education method: Explanation, Demonstration, Tactile cues, and Verbal cues Education comprehension: verbalized understanding, returned demonstration, and verbal cues required   HOME EXERCISE PROGRAM:  Access Code: [place] URL: [email or web address] Date: 04/26/2022 Prepared by: [place]  Exercises - Standing March with Counter Support  - 1 x daily - 7 x weekly - 3 sets - 10 reps - Standing Knee Flexion with Unilateral Counter Support  - 1 x daily - 7 x weekly - 3 sets - 10 reps - Heel Toe Raises with Unilateral Counter Support  - 1 x daily - 7 x weekly - 3 sets - 10 reps - Standing Hip Abduction with Unilateral Counter Support  - 1 x daily - 7 x weekly - 3 sets - 10 reps - Standing Hip Extension with Unilateral Counter Support  - 1 x daily - 7 x weekly - 3 sets - 10 reps   PT Short Term Goals        PT SHORT TERM GOAL #1   Title Patient will be independent in home exercise program to improve strength/mobility for better functional independence with  ADLs.    Baseline no  HEP    Time 4    Period Weeks    Status New    Target Date 04/16/22              PT Long Term Goals       PT LONG TERM GOAL #1   Title Patient will increase FOTO score to equal to or greater than   48  to demonstrate statistically significant improvement in mobility and quality of life.    Baseline 42    Time 8    Period Weeks    Status New    Target Date 06/11/22      PT LONG TERM GOAL #2   Title Patient  will complete five times sit to stand test in < 17 seconds indicating an increased LE strength and improved balance.    Baseline 23.53 sec    Time 12    Period Weeks    Status New    Target Date 06/11/22      PT LONG TERM GOAL #3   Title Patient will increase Berg Balance score by > 6 points to demonstrate decreased fall risk during functional activities.    Baseline 34    Time 12    Period Weeks    Status New      PT LONG TERM GOAL #4   Title Patient will increase six minute walk test distance to >1000 for progression to community ambulator and improve gait ability    Baseline test session 2    Time 12    Period Weeks    Status New    Target Date 06/11/22      PT LONG TERM GOAL #5   Title Patient will increase 10 meter walk test to >1.39ms as to improve gait speed for better community ambulation and to reduce fall risk.    Baseline to test session 2    Time 12    Period Weeks    Status New    Target Date 06/11/22              Plan     Clinical Impression Statement Continued with current plan of care as laid out in evaluation and recent prior sessions. Pt remains motivated to advance progress toward goals in order to maximize independence and safety at home. Pt requires high level assistance and cuing for completion of exercises in order to provide adequate level of stimulation and perturbation. Author allows pt as much opportunity as possible to perform independent righting strategies, only stepping in when pt is unable to prevent  falling to floor. Pt closely monitored throughout session for safe vitals response and to maximize patient safety during interventions. Pt continues to demonstrate progress toward goals AEB progression of some interventions this date either in volume or intensity.     Personal Factors and Comorbidities Age;Comorbidity 1;Comorbidity 2;Comorbidity 3+    Comorbidities CAD, Peripheral neuropathy, T2DM, HTN,    Examination-Activity Limitations Caring for Others;Carry;Locomotion Level;Stairs;Stand    Examination-Participation Restrictions Yard Work;Shop;Community Activity    Stability/Clinical Decision Making Evolving/Moderate complexity    Rehab Potential Fair    PT Frequency 2x / week    PT Duration 12 weeks    PT Treatment/Interventions ADLs/Self Care Home Management;Moist Heat;DME Instruction;Stair training;Functional mobility training;Therapeutic activities;Therapeutic exercise;Balance training;Neuromuscular re-education;Patient/family education;Wheelchair mobility training;Manual techniques;Dry needling;Energy conservation    PT Next Visit Plan test 6MWT and 10MWT, also establish HEP for 2 weeks [place]  next session    Consulted and Agree with Plan of Care Patient;Family member/caregiver              Particia Lather PT  05/08/22 3:51 PM   05/08/22, 3:51 PM

## 2022-05-10 ENCOUNTER — Encounter: Payer: Self-pay | Admitting: Physical Therapy

## 2022-05-10 ENCOUNTER — Ambulatory Visit: Payer: PPO | Admitting: Physical Therapy

## 2022-05-10 DIAGNOSIS — R2689 Other abnormalities of gait and mobility: Secondary | ICD-10-CM

## 2022-05-10 DIAGNOSIS — M6281 Muscle weakness (generalized): Secondary | ICD-10-CM

## 2022-05-10 DIAGNOSIS — R262 Difficulty in walking, not elsewhere classified: Secondary | ICD-10-CM

## 2022-05-10 DIAGNOSIS — R2681 Unsteadiness on feet: Secondary | ICD-10-CM

## 2022-05-10 NOTE — Therapy (Signed)
OUTPATIENT PHYSICAL THERAPY TREATMENT NOTE   Patient Name: [name] [name] MRN: [id number] DOB:1934-03-07, 88 y.o., male Today's Date: 05/11/2022  PCP: [name], MD REFERRING PROVIDER: [name], MD   PT End of Session - 05/10/22 1152     Visit Number 6    Number of Visits 24    Date for PT Re-Evaluation 06/11/22    Progress Note Due on Visit 10    PT Start Time 1150    PT Stop Time 1228    PT Time Calculation (min) 38 min    Equipment Utilized During Treatment Gait belt    Activity Tolerance Patient tolerated treatment well    Behavior During Therapy [facility] for tasks assessed/performed             Past Medical History:  Diagnosis Date   Aortic stenosis    a. 01/2016 echo: mod AS; b. 04/2018 Cardiac MRI: sev AS; c. 05/2018 s/p mech AVR @ time of myomectomy; d. 07/2018 Echo: Mild AS/AI.   Atherosclerosis of aorta ([place])    by CT scan in past   Atrial fibrillation (HCC)    and aflutter. pt has a left atrial circuit that is not ablated. was on amiodarone-stopped, now use rate control.    Bladder cancer ([place])    hx; treated with BCG in past    Carotid artery disease ([place])    There was calcified plaque but no stenosis by carotid artery screening done at [facility] October, 2013   CHB (complete heart block) [facility])    a. 05/2018 s/p BSX VVIR PPM ([facility]) in setting of bradycardia following myomectomy and AVR/MVR.   Diabetes mellitus    type not specified   Diabetic neuropathy ([place])    feet   Elevated liver enzymes    over time; hx   Excessive sweating    Glaucoma    Gout    Head injury    when slipped on ice 2004-2005. stabilized and back on Coumadin    Headache    migraines - distant past   History of cardiac cath    a. 05/2018 Cath ([facility]): Non-obstructive CAD. Mildly elevated filling pressures w/ nl CI.   HOCM (hypertrophic obstructive cardiomyopathy) ([place])    a. 01/2016 Echo: EF 65-70%, no rwma, LVOT gradient of 80-30mHg, mod AS, SAM; b.  11/2017 Echo: EF 55-60%, near cavity obliteration in systole, mod AS, mild MS; c. 04/2018 Card MRI ([facility]): Sev LVH, EF >70%, Sev AS, LVOT obs w/ Sev MR, mild to mod TR/PR, mid-myocardial HE in basal-mid anteroseptum and inferoseptum; d. 05/2018 s/p Septal myomectomy; e. 07/2018 Echo: EF 60-65%. PASP 374mg.   Homocystinemia ([place]   elevated, mild    Hypercholesterolemia    treated.    Hypertension    Infection of right inner ear    Kidney mass    a. s/p laproscopic surgery woth cryoablation of a mass outside [facility]/2018 CT Chest: 2cm R kidney mass.   Mediastinal adenopathy    a. 12/208 CT Chest: up to 51m44mLL lung nodule. 1.5-1.7cm subcarinal/mediastinal adenopahty/right paratracheal lymph node; b. 11/2018 CT Chest: 2.5-3cm mediastinal adenopathy.   Mitral regurgitation    a. 04/2018 Cardiac MRI: sev MR; b. 05/2018 s/p mech MVR @ time of myomectomy; c. 07/2018 Echo: Mild MS.   Motion sickness    ocean boats   Nausea    Nonobstructive coronary artery disease    a. 05/2018 Cath ([facility]): nonobs CAD.  Obstructive sleep apnea    CPAP started successfully 2014   Orthostatic hypotension    a. orthostatic. dehydration. hospitalized 11/11   Sleep apnea    Significant obstructive sleep apnea diagnosed in August, 2012, the patient is to receive CPAP    Stroke [facility])    "2 old strokes" CT and MRI. [facility] 11/11. diagnosis was dehydration, no acutal reports.    TSH elevation    on synthroid historically    Unsteady gait    August, 2012   Past Surgical History:  Procedure Laterality Date   BLADDER SURGERY     CATARACT EXTRACTION W/PHACO Left 05/11/2015   Procedure: CATARACT EXTRACTION PHACO AND INTRAOCULAR LENS PLACEMENT ([place]);  Surgeon: Leandrew Koyanagi, MD;  Location: [place];  Service: Ophthalmology;  Laterality: Left;  DIABETIC - oral meds, CPAP   COLONOSCOPY WITH PROPOFOL N/A 04/14/2019   Procedure: COLONOSCOPY WITH PROPOFOL;  Surgeon: [name],  MD;  Location: [facility] ENDOSCOPY;  Service: Endoscopy;  Laterality: N/A;   ESOPHAGOGASTRODUODENOSCOPY (EGD) WITH PROPOFOL N/A 07/01/2018   Procedure: ESOPHAGOGASTRODUODENOSCOPY (EGD) WITH PROPOFOL;  Surgeon: [name], MD;  Location: [facility] ENDOSCOPY;  Service: Endoscopy;  Laterality: N/A;   INSERT / REPLACE / [place]     "froze mass"   MECHANICAL AORTIC AND MITRAL VALVE REPLACEMENT  05/2018   [facility]    TONSILLECTOMY     VALVE REPLACEMENT     Patient Active Problem List   Diagnosis Date Noted   Other abnormalities of gait and mobility 11/15/2021   Depression 11/15/2021   Encounter for fitting and adjustment of hearing aid 11/15/2021   Abnormal CT scan    Abnormal findings on radiological examination of gastrointestinal tract    Polyp of colon    Cancer of right kidney ([place]) 12/10/2018   Pulmonary nodules 12/01/2018   Mediastinal lymphadenopathy 12/01/2018   Heart block AV third degree ([place]) 10/07/2018   Pacemaker 10/07/2018   H/O aortic valve replacement 10/07/2018   Blood in stool    Bleeding duodenal ulcer    GIB (gastrointestinal bleeding) 06/30/2018   Acute upper GI bleed    Decreased activities of daily living (ADL) 06/09/2018   Decreased strength, endurance, and mobility 06/09/2018   Pleural effusion, right 06/09/2018   S/P AVR (aortic valve replacement) 06/08/2018   H/O ventricular septal myectomy 06/08/2018   Chronic anticoagulation 06/02/2018   Heart murmur, systolic 93/79/0240   H/O: rheumatic fever 03/21/2017   Tinnitus 10/30/2016   On warfarin therapy 07/06/2015   Arteriosclerosis of coronary artery 06/13/2015   Carcinoma in situ of bladder 06/13/2015   Diabetes mellitus, type 2 ([place]) 06/13/2015   Diabetic neuropathy ([place]) 06/13/2015   Dizziness 06/13/2015   Essential (primary) hypertension 06/13/2015   Fatigue 06/13/2015   Glaucoma 06/13/2015   Hypercholesteremia 06/13/2015   Male hypogonadism 06/13/2015    Adult hypothyroidism 06/13/2015   Arthritis, degenerative 06/13/2015   CAP (community acquired pneumonia) 06/13/2015   Adenocarcinoma, renal cell ([place]) 06/13/2015   Benign head tremor 12/13/2014   Carotid arterial disease ([place]) 07/12/2014   Decreased cardiac output 07/12/2014   Cardiomyopathy, hypertrophic obstructive ([place]) 07/12/2014   Head injuries 07/12/2014   HOCM (hypertrophic obstructive cardiomyopathy) ([place])    Encounter for therapeutic drug monitoring 11/16/2013   Obstructive sleep apnea    Benign prostatic hypertrophy without urinary obstruction 06/09/2012   History of neoplasm of bladder 06/09/2012   Head injury    Atherosclerosis of aorta ([place])  Hypotension    Stroke ([place])    Unsteady gait    Excessive sweating    Nausea    ORTHOSTATIC HYPOTENSION, HX OF 09/01/2010   Overweight 03/07/2009   MITRAL REGURGITATION 03/05/2009   Chronic atrial fibrillation ([place]) 03/05/2009   Atrial fibrillation and flutter ([place]) 03/05/2009   Mitral and aortic incompetence 03/05/2009      REFERRING DIAG: Imbalance  THERAPY DIAG:  Difficulty in walking, not elsewhere classified  Other abnormalities of gait and mobility  Unsteadiness on feet  Muscle weakness (generalized)  Rationale for Evaluation and Treatment Rehabilitation  PERTINENT HISTORY:  Pt report she has been having balance issues secondary to his ears. Pt was in the artilerary. 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 Pt sold a home in april and has fallen 5 times since then. Pt tripped over a piece of wood in back yard and fell on one occassion. Pt has history of cardiac rehab here at [facility]. Pt has a very difficult time with balance without his cane. He has difficulty with balance when heturns his head.   PRECAUTIONS: Fall  SUBJECTIVE: Pt doing well. Denies any new falls this week; He reports putting in new batteries in hearing aids but still can't hear out of them.    Presents to therapy with his wife;  PAIN:  Are you having pain? No     TODAY'S TREATMENT:   Ex: Standing with 2# ankle weight: -alternate march 2x10 reps -hip abduction SLR 2x10 reps each LE- cues to keep foot oriented forward to isolate hip strengthening; -hip extension SLR 2x10 reps; -heel/toe raises 2x10 reps with cues to avoid trunk movement to isolate ankle strengthening;   During 2nd set- challenged patient to only hold onto railing with 1 UE assist- he did report feeling increased lightheaded feeling during 2nd set; PT had patient sit down and assessed vitals: BP: 135/65 Had patient stand up: 116/54 Pt is scheduled to see cardiologist next week;  Re-educated in safety precautions with orthostatic hypotension: including to change positions slowly and to pump arms when feeling dizzy;  Pt reports minimal difficulty with standing resisted exercise;        NMR  Standing on airex pad: -alternate toe taps to 6 inch step with 1-0 rail assist x10 reps, heavy lean to left side requiring min A for safety; -standing one foot on airex, one foot on 6 inch step:  Unsupported standing, 30 sec hold, min A with increased challenge when standing on left side, falling to left side;  Standing with narrow base of support on Airex pad 3 x 30 seconds.  Standing with feet together unsupported 30 sec hold with increased lateral loss of balance; Pt required CGA to close supervision. He was instructed in various tasks on white board to reduce UE weight bearing on railing while standing on compliant surface;  Tolerated well- He did have some light headed feeling during session, but this was alleviated with short rest break;    PATIENT EDUCATION: Education details: Pt given verbal cuing on proper demonstration of technique during performance of exercises and as part of HEP. Person educated: Patient Education method: Explanation, Demonstration, Tactile cues, and Verbal cues Education  comprehension: verbalized understanding, returned demonstration, and verbal cues required   HOME EXERCISE PROGRAM:  Access Code: [place] URL: [email or web address] Date: 04/26/2022 Prepared by: [place]  Exercises - Standing March with Counter Support  - 1 x daily - 7 x weekly -  3 sets - 10 reps - Standing Knee Flexion with Unilateral Counter Support  - 1 x daily - 7 x weekly - 3 sets - 10 reps - Heel Toe Raises with Unilateral Counter Support  - 1 x daily - 7 x weekly - 3 sets - 10 reps - Standing Hip Abduction with Unilateral Counter Support  - 1 x daily - 7 x weekly - 3 sets - 10 reps - Standing Hip Extension with Unilateral Counter Support  - 1 x daily - 7 x weekly - 3 sets - 10 reps   PT Short Term Goals        PT SHORT TERM GOAL #1   Title Patient will be independent in home exercise program to improve strength/mobility for better functional independence with ADLs.    Baseline no HEP    Time 4    Period Weeks    Status New    Target Date 04/16/22              PT Long Term Goals       PT LONG TERM GOAL #1   Title Patient will increase FOTO score to equal to or greater than   48  to demonstrate statistically significant improvement in mobility and quality of life.    Baseline 42    Time 8    Period Weeks    Status New    Target Date 06/11/22      PT LONG TERM GOAL #2   Title Patient  will complete five times sit to stand test in < 17 seconds indicating an increased LE strength and improved balance.    Baseline 23.53 sec    Time 12    Period Weeks    Status New    Target Date 06/11/22      PT LONG TERM GOAL #3   Title Patient will increase Berg Balance score by > 6 points to demonstrate decreased fall risk during functional activities.    Baseline 34    Time 12    Period Weeks    Status New      PT LONG TERM GOAL #4   Title Patient will increase six minute walk test distance to >1000 for progression to community ambulator and improve  gait ability    Baseline test session 2    Time 12    Period Weeks    Status New    Target Date 06/11/22      PT LONG TERM GOAL #5   Title Patient will increase 10 meter walk test to >1.58ms as to improve gait speed for better community ambulation and to reduce fall risk.    Baseline to test session 2    Time 12    Period Weeks    Status New    Target Date 06/11/22              Plan     Clinical Impression Statement Pt motivated and participated well within session. He was instructed in advanced LE strengthening exercise. Progressed strengthening with reducing rail assist to facilitate increased LE weight bearing. During 2nd set patient did report dizziness. Assessed BP and orthostatic hypotension present. PT re-educated patient in precautions with pumping arms to raise BP. He was instructed in advanced balance exercise, utilizing compliant surface to challenge stance control. He did have trouble standing with narrow base of support with reduced rail assist. Patient would benefit from additional skilled PT intervention to improve strength, balance and gait safety;  Personal Factors and Comorbidities Age;Comorbidity 1;Comorbidity 2;Comorbidity 3+    Comorbidities CAD, Peripheral neuropathy, T2DM, HTN,    Examination-Activity Limitations Caring for Others;Carry;Locomotion Level;Stairs;Stand    Examination-Participation Restrictions Yard Work;Shop;Community Activity    Stability/Clinical Decision Making Evolving/Moderate complexity    Rehab Potential Fair    PT Frequency 2x / week    PT Duration 12 weeks    PT Treatment/Interventions ADLs/Self Care Home Management;Moist Heat;DME Instruction;Stair training;Functional mobility training;Therapeutic activities;Therapeutic exercise;Balance training;Neuromuscular re-education;Patient/family education;Wheelchair mobility training;Manual techniques;Dry needling;Energy conservation    PT Next Visit Plan test 6MWT and 10MWT, also establish  HEP for 2 weeks pt will be at [place] to establish next session    Consulted and Agree with Plan of Care Patient;Family member/caregiver              Hillis Range PT, DPT  05/11/22 8:25 AM   05/11/22, 8:25 AM

## 2022-05-15 ENCOUNTER — Ambulatory Visit: Payer: PPO | Admitting: Cardiovascular Disease

## 2022-05-15 ENCOUNTER — Encounter: Payer: Self-pay | Admitting: Cardiovascular Disease

## 2022-05-15 VITALS — BP 151/72 | HR 70 | Ht 69.0 in | Wt 165.1 lb

## 2022-05-15 DIAGNOSIS — I2510 Atherosclerotic heart disease of native coronary artery without angina pectoris: Secondary | ICD-10-CM

## 2022-05-15 DIAGNOSIS — I421 Obstructive hypertrophic cardiomyopathy: Secondary | ICD-10-CM | POA: Diagnosis not present

## 2022-05-15 DIAGNOSIS — I442 Atrioventricular block, complete: Secondary | ICD-10-CM | POA: Diagnosis not present

## 2022-05-15 DIAGNOSIS — I38 Endocarditis, valve unspecified: Secondary | ICD-10-CM | POA: Diagnosis not present

## 2022-05-15 DIAGNOSIS — I10 Essential (primary) hypertension: Secondary | ICD-10-CM

## 2022-05-15 DIAGNOSIS — I4821 Permanent atrial fibrillation: Secondary | ICD-10-CM

## 2022-05-15 DIAGNOSIS — E785 Hyperlipidemia, unspecified: Secondary | ICD-10-CM

## 2022-05-15 NOTE — Progress Notes (Signed)
Cardiology Office Note   Date:  05/15/2022   ID:  Chayne, Baumgart Apr 17, 1934, MRN [id number]  PCP:  [name], MD  Cardiologist:   Kathlyn Sacramento, MD   Chief Complaint  Patient presents with   Other    6 month f/u c/o drop in BP from sitting to standing per physical therapy and dizziness. Meds reviewed verbally with pt.      History of Present Illness: [name] [name] is a 88 y.o. male who presents for for a follow-up visit. He has extensive past medical history including chronic atrial fibrillation, aortic stenosis, HOCM, hyperlipidemia, hypertension, type 2 diabetes mellitus, sleep apnea, mitral regurgitation, and hypothyroidism.  He was evaluated at [facility] in mid 2019 in the setting of progressive dyspnea and HOCM.  Cardiac MRI showed significant LVH with left ventricular outflow tract obstruction along with severe aortic stenosis and severe mitral regurgitation.  Right and left heart cardiac catheterization showed nonobstructive CAD and normal cardiac index.  He  subsequently underwent successful septal myomectomy with mechanical aortic valve replacement and mechanical mitral valve replacement in August 2019.  Postoperative course complicated by anemia requiring transfusion as well as pleural effusion requiring thoracentesis.  He also had complete heart block which  required placement of a [employer] single lead permanent pacemaker.  He was discharged to rehabilitation and then in September 2019, he was admitted to [facility] in the setting of GI bleeding with supratherapeutic INR.  EGD showed duodenal ulcer with a visible vessel that was treated with hemo-spray.  Follow-up echocardiogram October 2019 showed normal LV function with appropriately functioning mechanical valves and improved pulmonary artery systolic pressure.   In February of 2020, he suffered from vestibular neuritis in the right ear with significant hearing loss and balance problems.  He was  seen by ENT and treated with steroids but reports minimal improvement.  He was also diagnosed with mediastinal lymphadenopathy but he is currently being monitored by oncology.  He has been following up with [facility].  Repeat echo in 2020 showed normal LV systolic function and normal functioning mitral and aortic mechanical valves.  PA pressure was mildly elevated at 39.  No significant change from before.    Unfortunately, he fell in September which resulted in an L1 compression fracture.  He was treated conservatively.  He declined last year and lost 30 pounds.  However, that has stabilized and his weight has been stable over the last 6 months.  He had issues with recurrent falls and he has been attending physical therapy.  He did have some orthostatic dizziness but his symptoms are overall mild.  He denies chest pain or shortness of breath.   Past Medical History:  Diagnosis Date   Aortic stenosis    a. 01/2016 echo: mod AS; b. 04/2018 Cardiac MRI: sev AS; c. 05/2018 s/p mech AVR @ time of myomectomy; d. 07/2018 Echo: Mild AS/AI.   Atherosclerosis of aorta ([place])    by CT scan in past   Atrial fibrillation (HCC)    and aflutter. pt has a left atrial circuit that is not ablated. was on amiodarone-stopped, now use rate control.    Bladder cancer ([place])    hx; treated with BCG in past    Carotid artery disease ([place])    There was calcified plaque but no stenosis by carotid [facility] screening [facility], [facility]   CHB (complete heart block) [facility])    a. 05/2018 s/p BSX  VVIR PPM ([facility]) in setting of bradycardia following myomectomy and AVR/MVR.   Diabetes mellitus    type not specified   Diabetic neuropathy ([place])    feet   Elevated liver enzymes    over time; hx   Excessive sweating    Glaucoma    Gout    Head injury    when slipped on ice 2004-2005. stabilized and back on Coumadin    Headache    migraines - distant past   History of cardiac cath    a. 05/2018 Cath  ([facility]): Non-obstructive CAD. Mildly elevated filling pressures w/ nl CI.   HOCM (hypertrophic obstructive cardiomyopathy) ([place])    a. 01/2016 Echo: EF 65-70%, no rwma, LVOT gradient of 80-42mHg, mod AS, SAM; b. 11/2017 Echo: EF 55-60%, near cavity obliteration in systole, mod AS, mild MS; c. 04/2018 Card MRI ([facility]): Sev LVH, EF >70%, Sev AS, LVOT obs w/ Sev MR, mild to mod TR/PR, mid-myocardial HE in basal-mid anteroseptum and inferoseptum; d. 05/2018 s/p Septal myomectomy; e. 07/2018 Echo: EF 60-65%. PASP 335mg.   [place]ocystinemia [place]   elevated, mild    Hypercholesterolemia    treated.    Hypertension    Infection of right inner ear    Kidney mass    a. s/p laproscopic surgery woth cryoablation of a mass outside [facility] Chest: 2cm R kidney mass.   Mediastinal adenopathy    a. 12/208 CT Chest: up to 61m19mLL lung nodule. 1.5-1.7cm subcarinal/mediastinal adenopahty/right paratracheal lymph node; b. 11/2018 CT Chest: 2.5-3cm mediastinal adenopathy.   Mitral regurgitation    a. 04/2018 Cardiac MRI: sev MR; b. 05/2018 s/p mech MVR @ time of myomectomy; c. 07/2018 Echo: Mild MS.   Motion sickness    ocean boats   Nausea    Nonobstructive coronary artery disease    a. 05/2018 Cath ([facility]): nonobs CAD.   Obstructive sleep apnea    CPAP started successfully 2014   Orthostatic hypotension    a. orthostatic. dehydration. hospitalized 11/11   Sleep apnea    Significant obstructive sleep apnea diagnosed in August, 2012, the patient is to receive CPAP    [facility]" CT and MRI.  hospital 11/11. diagnosis was dehydration, no acutal reports.    TSH elevation    on synthroid historically    Unsteady gait    August, 2012    Past Surgical History:  Procedure Laterality Date   BLADDER SURGERY     CATARACT EXTRACTION W/PHACO Left 05/11/2015   Procedure: CATARACT EXTRACTION PHACO AND INTRAOCULAR LENS PLACEMENT ([place] Surgeon: ChaLeandrew KoyanagiMD;  [facility] [place];  Laterality: Left;  DIABETIC - oral meds, CPAP   COLONOSCOPY WITH PROPOFOL N/A 04/14/2019   Procedure: COLONOSCOPY WITH PROPOFOL;  Surgeon: WohLucilla LameD;  [facility];  Service: Endoscopy;  Laterality: N/A;   ESOPHAGOGASTRODUODENOSCOPY (EGD) WITH PROPOFOL N/A 07/01/2018   Procedure: ESOPHAGOGASTRODUODENOSCOPY (EGD) WITH PROPOFOL;  Surgeon: WohLucilla LameD;  Location: [facility]COPY;  Service: Endoscopy;  Laterality: N/A;   [place] / [place]  "fro[place]"   MECHANICAL AORTIC AND MITRAL VALVE REPLACEMENT  05/2018   [facility]    TONSILLECTOMY     VALVE REPLACEMENT       Current Outpatient Medications  Medication Sig Dispense Refill   alendronate (FOSAMAX) 70 MG tablet Take  1 tablet (70 mg total) by mouth once a week. Take with a full glass of water on an empty stomach. 4 tablet 11   ALPRAZolam (XANAX) 0.25 MG tablet Take 1 tablet (0.25 mg total) by mouth every 6 (six) hours as needed for anxiety. 60 tablet 0   amoxicillin (AMOXIL) 500 MG tablet Take 1 tablet (500 mg total) by mouth as needed (TAKE AS NEEDED FOR ANY DENTAL PROCEDURES). Take 4 tablets 30-60 minutes prior to any dental work 4 tablet 1   aspirin EC 81 MG tablet Take 1 tablet (81 mg total) by mouth daily.     atorvastatin (LIPITOR) 40 MG tablet TAKE 1 TABLET BY MOUTH ONCE DAILY . APPOINTMENT REQUIRED FOR FUTURE REFILLS 90 tablet 0   glucose blood (ONETOUCH ULTRA) test strip USE 1 STRIP TO CHECK GLUCOSE ONCE DAILY 50 each 0   Lancets (ONETOUCH DELICA PLUS ACZYSA63K) MISC USE 1  TO CHECK GLUCOSE ONCE DAILY 100 each 6   latanoprost (XALATAN) 0.005 % ophthalmic solution Place 1 drop into both eyes at bedtime.      levothyroxine (SYNTHROID) 50 MCG tablet Take 1 tablet by mouth once daily 90 tablet 3   metoprolol tartrate (LOPRESSOR) 25 MG tablet TAKE 1/2 (ONE-HALF) TABLET BY MOUTH TWICE DAILY. 90 tablet 2    ONETOUCH DELICA LANCETS 16W MISC USE ONE LANCET TO CHECK BLOOD GLUCOSE ONCE DAILY 100 each 12   ONETOUCH ULTRA test strip USE 1 STRIP TO CHECK GLUCOSE ONCE DAILY 50 each 0   pantoprazole (PROTONIX) 40 MG tablet Take 1 tablet (40 mg total) by mouth 2 (two) times daily. 180 tablet 2   warfarin (COUMADIN) 3 MG tablet TAKE 1 TABLET BY MOUTH ONCE DAILY OR AS DIRECTED 60 tablet 0   No current facility-administered medications for this visit.    Allergies:   Macrolides and ketolides, Mycinettes, Nitrofuran derivatives, Nitrofurantoin, Penicillins, Erythromycin, Zofran [ondansetron hcl-dextrose], and Zofran [ondansetron hcl]    Social History:  The patient  reports that he quit smoking about 47 years ago. His smoking use included cigars and cigarettes. He has never used smokeless tobacco. He reports that he does not drink alcohol and does not use drugs.   Family History:  The patient's family history includes Arrhythmia in his brother and father; Breast cancer in his mother; Prostate cancer in his father; Stroke in his father.    ROS:  Please see the history of present illness.   Otherwise, review of systems are positive for none.   All other systems are reviewed and negative.    PHYSICAL EXAM: VS:  BP (!) 151/72 (BP Location: Left Arm, Patient Position: Sitting, Cuff Size: Normal)   Pulse 70   Ht '5\' 9"'$  (1.753 m)   Wt 165 lb 2 oz (74.9 kg)   SpO2 97%   BMI 24.38 kg/m  , BMI Body mass index is 24.38 kg/m. GEN: Well nourished, well developed, in no acute distress  HEENT: normal  Neck: no JVD, carotid bruits, or masses Cardiac: Regular rate and rhythm; no rubs, or gallops,no edema .  Normal mechanical heart sound. Respiratory:  clear to auscultation bilaterally, normal work of breathing GI: soft, nontender, nondistended, + BS MS: no deformity or atrophy  Skin: warm and dry, no rash Neuro:  Strength and sensation are intact Psych: euthymic mood, full affect   EKG:  EKG is ordered  today. The ekg ordered today demonstrates ventricular paced rhythm with underlying atrial fibrillation.   Recent Labs: 12/15/2021: ALT 15; BUN 15;  Creatinine, Ser 0.91; Potassium 3.9; Sodium 136 03/27/2022: Hemoglobin 12.0; Platelets 177; TSH 3.320    Lipid Panel    Component Value Date/Time   CHOL 178 10/25/2020 1006   TRIG 153 (H) 10/25/2020 1006   HDL 41 10/25/2020 1006   CHOLHDL 4.3 10/25/2020 1006   LDLCALC 110 (H) 10/25/2020 1006      Wt Readings from Last 3 Encounters:  05/15/22 165 lb 2 oz (74.9 kg)  01/25/22 159 lb (72.1 kg)  12/15/21 161 lb 6.4 oz (73.2 kg)        ASSESSMENT AND PLAN:   1.  Hypertrophic obstructive cardiomyopathy: Status post septal myomectomy at [facility] in 2019 with mechanical mitral and aortic valve replacements.  Doing well overall with no symptoms of heart failure.   2.  Valvular heart disease: Status post mechanical mitral and aortic valve replacements.  He is chronically anticoagulated with Coumadin and followed closely in our Coumadin clinic.  Normal functioning valves on echo in October 2019.  Continue low-dose aspirin as well.   3.  Complete heart block: This developed following septal myomectomy.  He is status post single lead Product/process development scientist and this is followed at [facility].    4.  Permanent atrial fibrillation: Rate controlled on beta-blocker therapy.  Anticoagulated with Coumadin.   5.  Essential hypertension: Blood pressure is controlled on small dose metoprolol.  He is mildly orthostatic but his symptoms are overall mild.  No need to stop metoprolol at this point.   5.  Nonobstructive CAD: Minimal CAD on catheterization in August 2019.  Continue aspirin, beta-blocker, and statin therapy.    7.  Hyperlipidemia: He remains on statin therapy.  8.  Recurrent falls: Seems to be improving with physical therapy.     Disposition:   FU with me in 6 months  Signed,  Kathlyn Sacramento, MD  05/15/2022 3:14 PM    [place]

## 2022-05-15 NOTE — Patient Instructions (Signed)

## 2022-05-17 ENCOUNTER — Ambulatory Visit: Payer: PPO | Attending: Family Medicine

## 2022-05-17 DIAGNOSIS — R262 Difficulty in walking, not elsewhere classified: Secondary | ICD-10-CM

## 2022-05-17 DIAGNOSIS — M6281 Muscle weakness (generalized): Secondary | ICD-10-CM | POA: Diagnosis not present

## 2022-05-17 DIAGNOSIS — R2681 Unsteadiness on feet: Secondary | ICD-10-CM

## 2022-05-17 DIAGNOSIS — R2689 Other abnormalities of gait and mobility: Secondary | ICD-10-CM

## 2022-05-17 NOTE — Therapy (Signed)
OUTPATIENT PHYSICAL THERAPY TREATMENT NOTE   Patient Name: [name] [name] MRN: [id number] DOB:04/10/1934, 88 y.o., male Today's Date: 05/17/2022  PCP: [name], MD REFERRING PROVIDER: [name], MD   PT End of Session - 05/17/22 1207     Visit Number 7    Number of Visits 24    Date for PT Re-Evaluation 06/11/22    Authorization Type [employer]    Authorization Time Period -06/11/22    Progress Note Due on Visit 10    PT Start Time 1145    PT Stop Time 1225    PT Time Calculation (min) 40 min    Equipment Utilized During Treatment Gait belt    Activity Tolerance Patient tolerated treatment well    Behavior During Therapy [facility] for tasks assessed/performed             Past Medical History:  Diagnosis Date   Aortic stenosis    a. 01/2016 echo: mod AS; b. 04/2018 Cardiac MRI: sev AS; c. 05/2018 s/p mech AVR @ time of myomectomy; d. 07/2018 Echo: Mild AS/AI.   Atherosclerosis of aorta ([place])    by CT scan in past   Atrial fibrillation (HCC)    and aflutter. pt has a left atrial circuit that is not ablated. was on amiodarone-stopped, now use rate control.    Bladder cancer ([place])    hx; treated with BCG in past    Carotid artery disease ([place])    There was calcified plaque but no stenosis by carotid artery screening done at [facility] October, 2013   CHB (complete heart block) [facility])    a. 05/2018 s/p BSX VVIR PPM ([facility]) in setting of bradycardia following myomectomy and AVR/MVR.   Diabetes mellitus    type not specified   Diabetic neuropathy ([place])    feet   Elevated liver enzymes    over time; hx   Excessive sweating    Glaucoma    Gout    Head injury    when slipped on ice 2004-2005. stabilized and back on Coumadin    Headache    migraines - distant past   History of cardiac cath    a. 05/2018 Cath ([facility]): Non-obstructive CAD. Mildly elevated filling pressures w/ nl CI.   HOCM (hypertrophic obstructive cardiomyopathy)  ([place])    a. 01/2016 Echo: EF 65-70%, no rwma, LVOT gradient of 80-34mHg, mod AS, SAM; b. 11/2017 Echo: EF 55-60%, near cavity obliteration in systole, mod AS, mild MS; c. 04/2018 Card MRI ([facility]): Sev LVH, EF >70%, Sev AS, LVOT obs w/ Sev MR, mild to mod TR/PR, mid-myocardial HE in basal-mid anteroseptum and inferoseptum; d. 05/2018 s/p Septal myomectomy; e. 07/2018 Echo: EF 60-65%. PASP 357mg.   Homocystinemia ([place]   elevated, mild    Hypercholesterolemia    treated.    Hypertension    Infection of right inner ear    Kidney mass    a. s/p laproscopic surgery woth cryoablation of a mass outside [facility] Chest: 2cm R kidney mass.   Mediastinal adenopathy    a. 12/208 CT Chest: up to 70m670mLL lung nodule. 1.5-1.7cm subcarinal/mediastinal adenopahty/right paratracheal lymph node; b. 11/2018 CT Chest: 2.5-3cm mediastinal adenopathy.   Mitral regurgitation    a. 04/2018 Cardiac MRI: sev MR; b. 05/2018 s/p mech MVR @ time of myomectomy; c. 07/2018 Echo: Mild MS.   Motion sickness    ocean boats   Nausea  Nonobstructive coronary artery disease    a. 05/2018 Cath ([facility]): nonobs CAD.   Obstructive sleep apnea    CPAP started successfully 2014   Orthostatic hypotension    a. orthostatic. dehydration. hospitalized 11/11   Sleep apnea    Significant obstructive sleep apnea diagnosed in August, 2012, the patient is to receive CPAP    Stroke [facility])    "2 old strokes" CT and MRI. [place] [facility] 11/11. diagnosis was dehydration, no acutal reports.    TSH elevation    on synthroid historically    Unsteady gait    August, 2012   Past Surgical History:  Procedure Laterality Date   BLADDER SURGERY     CATARACT EXTRACTION W/PHACO Left 05/11/2015   Procedure: CATARACT EXTRACTION PHACO AND INTRAOCULAR LENS PLACEMENT ([place]);  Surgeon: Leandrew Koyanagi, MD;  Location: [place];  Service: Ophthalmology;  Laterality: Left;  DIABETIC - oral meds, CPAP   COLONOSCOPY  WITH PROPOFOL N/A 04/14/2019   Procedure: COLONOSCOPY WITH PROPOFOL;  Surgeon: [name], MD;  Location: [facility] ENDOSCOPY;  Service: Endoscopy;  Laterality: N/A;   ESOPHAGOGASTRODUODENOSCOPY (EGD) WITH PROPOFOL N/A 07/01/2018   Procedure: ESOPHAGOGASTRODUODENOSCOPY (EGD) WITH PROPOFOL;  Surgeon: [name], MD;  Location: [facility] ENDOSCOPY;  Service: Endoscopy;  Laterality: N/A;   INSERT / REPLACE / [place]     "froze mass"   MECHANICAL AORTIC AND MITRAL VALVE REPLACEMENT  05/2018   [facility]    TONSILLECTOMY     VALVE REPLACEMENT     Patient Active Problem List   Diagnosis Date Noted   Other abnormalities of gait and mobility 11/15/2021   Depression 11/15/2021   Encounter for fitting and adjustment of hearing aid 11/15/2021   Abnormal CT scan    Abnormal findings on radiological examination of gastrointestinal tract    Polyp of colon    Cancer of right kidney ([place]) 12/10/2018   Pulmonary nodules 12/01/2018   Mediastinal lymphadenopathy 12/01/2018   Heart block AV third degree ([place]) 10/07/2018   Pacemaker 10/07/2018   H/O aortic valve replacement 10/07/2018   Blood in stool    Bleeding duodenal ulcer    GIB (gastrointestinal bleeding) 06/30/2018   Acute upper GI bleed    Decreased activities of daily living (ADL) 06/09/2018   Decreased strength, endurance, and mobility 06/09/2018   Pleural effusion, right 06/09/2018   S/P AVR (aortic valve replacement) 06/08/2018   H/O ventricular septal myectomy 06/08/2018   Chronic anticoagulation 06/02/2018   Heart murmur, systolic 40/98/1191   H/O: rheumatic fever 03/21/2017   Tinnitus 10/30/2016   On warfarin therapy 07/06/2015   Arteriosclerosis of coronary artery 06/13/2015   Carcinoma in situ of bladder 06/13/2015   Diabetes mellitus, type 2 ([place]) 06/13/2015   Diabetic neuropathy ([place]) 06/13/2015   Dizziness 06/13/2015   Essential (primary) hypertension 06/13/2015   Fatigue 06/13/2015    Glaucoma 06/13/2015   Hypercholesteremia 06/13/2015   Male hypogonadism 06/13/2015   Adult hypothyroidism 06/13/2015   Arthritis, degenerative 06/13/2015   CAP (community acquired pneumonia) 06/13/2015   Adenocarcinoma, renal cell ([place]) 06/13/2015   Benign head tremor 12/13/2014   Carotid arterial disease ([place]) 07/12/2014   Decreased cardiac output 07/12/2014   Cardiomyopathy, hypertrophic obstructive ([place]) 07/12/2014   Head injuries 07/12/2014   HOCM (hypertrophic obstructive cardiomyopathy) ([place])    Encounter for therapeutic drug monitoring 11/16/2013   Obstructive sleep apnea    Benign prostatic hypertrophy without urinary obstruction 06/09/2012   History of neoplasm  of bladder 06/09/2012   Head injury    Atherosclerosis of aorta (HCC)    Hypotension    Stroke (HCC)    Unsteady gait    Excessive sweating    Nausea    ORTHOSTATIC HYPOTENSION, HX OF 09/01/2010   Overweight 03/07/2009   MITRAL REGURGITATION 03/05/2009   Chronic atrial fibrillation ([place]) 03/05/2009   Atrial fibrillation and flutter ([place]) 03/05/2009   Mitral and aortic incompetence 03/05/2009      REFERRING DIAG: Imbalance  THERAPY DIAG:  Difficulty in walking, not elsewhere classified  Other abnormalities of gait and mobility  Unsteadiness on feet  Muscle weakness (generalized)  Rationale for Evaluation and Treatment Rehabilitation  PERTINENT HISTORY:  Pt report she has been having balance issues secondary to his ears. Pt was in the artilerary. 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 Pt sold a home in april and has fallen 5 times since then. Pt tripped over a piece of wood in back yard and fell on one occassion. Pt has history of cardiac rehab here at [facility]. Pt has a very difficult time with balance without his cane. He has difficulty with balance when heturns his head.   PRECAUTIONS: Fall  SUBJECTIVE: Pt doing well today, no updates since last visit, no  falls. Pt denies dizziness in session. Saw cardiologist yesterday who was not concerned with pt's orthostatis, said it wasn't a big enough drop to worry about.   Presents to therapy with his wife;  PAIN:  Are you having pain? No   TODAY'S TREATMENT:  138/57  72bpm seated  115/57 76bm standing 0 minutes  114/57 74bpm standing 1 minute  -Overground AMB 420f c SPC, no symptoms, 1 LOB when turning head  -2 circles around chair  -Fwd Step up to plastic step, to airex, to floor x8  -static balance on airex hands free x60sec  -backward walking on line 1x263f lateral stepping x2069fPC bilat, then backward again x20f50fhen normal stance airex x60sec c SPC    PATIENT EDUCATION: Education details: Pt given verbal cuing on proper demonstration of technique during performance of exercises and as part of HEP. Person educated: Patient Education method: Explanation, Demonstration, Tactile cues, and Verbal cues Education comprehension: verbalized understanding, returned demonstration, and verbal cues required   HOME EXERCISE PROGRAM:  Access Code: [email or web address]: [email or web address] Date: 04/26/2022 Prepared by: JoshRaleigh Nationercises - Standing March with Counter Support  - 1 x daily - 7 x weekly - 3 sets - 10 reps - Standing Knee Flexion with Unilateral Counter Support  - 1 x daily - 7 x weekly - 3 sets - 10 reps - Heel Toe Raises with Unilateral Counter Support  - 1 x daily - 7 x weekly - 3 sets - 10 reps - Standing Hip Abduction with Unilateral Counter Support  - 1 x daily - 7 x weekly - 3 sets - 10 reps - Standing Hip Extension with Unilateral Counter Support  - 1 x daily - 7 x weekly - 3 sets - 10 reps   PT Short Term Goals        PT SHORT TERM GOAL #1   Title Patient will be independent in home exercise program to improve strength/mobility for better functional independence with ADLs.    Baseline no HEP    Time 4    Period Weeks    Status New    Target Date  04/16/22  PT Long Term Goals       PT LONG TERM GOAL #1   Title Patient will increase FOTO score to equal to or greater than   48  to demonstrate statistically significant improvement in mobility and quality of life.    Baseline 42    Time 8    Period Weeks    Status New    Target Date 06/11/22      PT LONG TERM GOAL #2   Title Patient  will complete five times sit to stand test in < 17 seconds indicating an increased LE strength and improved balance.    Baseline 23.53 sec    Time 12    Period Weeks    Status New    Target Date 06/11/22      PT LONG TERM GOAL #3   Title Patient will increase Berg Balance score by > 6 points to demonstrate decreased fall risk during functional activities.    Baseline 34    Time 12    Period Weeks    Status New      PT LONG TERM GOAL #4   Title Patient will increase six minute walk test distance to >1000 for progression to community ambulator and improve gait ability    Baseline test session 2    Time 12    Period Weeks    Status New    Target Date 06/11/22      PT LONG TERM GOAL #5   Title Patient will increase 10 meter walk test to >1.69ms as to improve gait speed for better community ambulation and to reduce fall risk.    Baseline to test session 2    Time 12    Period Weeks    Status New    Target Date 06/11/22              Plan     Clinical Impression Statement Pt again orthostatic today, but without any symptoms. Tolerates session without any emergent needs for return to sitting. [id number]  with SPC, good pacing, but LOB with head turns during conversation. AuChief Strategy Officertilized caTeacher, adult educationo facilitate verbal communication, however pt hears best today on left side. Big emphasis on step training and gait based training. Patient would benefit from additional skilled PT intervention to improve strength, balance and gait safety;    Personal Factors and Comorbidities Age;Comorbidity 1;Comorbidity 2;Comorbidity 3+     Comorbidities CAD, Peripheral neuropathy, T2DM, HTN,    Examination-Activity Limitations Caring for Others;Carry;Locomotion Level;Stairs;Stand    Examination-Participation Restrictions Yard Work;Shop;Community Activity    Stability/Clinical Decision Making Evolving/Moderate complexity    Rehab Potential Fair    PT Frequency 2x / week    PT Duration 12 weeks    PT Treatment/Interventions ADLs/Self Care Home Management;Moist Heat;DME Instruction;Stair training;Functional mobility training;Therapeutic activities;Therapeutic exercise;Balance training;Neuromuscular re-education;Patient/family education;Wheelchair mobility training;Manual techniques;Dry needling;Energy conservation    PT Next Visit Plan test 6MWT and 10MWT, also establish HEP for 2 [place]    Consulted and Agree with Plan of Care Patient;Family member/caregiver              12:25 PM, 05/17/22 [place] GrandchildPT, DPT Physical Therapist - [facility] [email or web address], DPT  05/17/22 12:24 PM   05/17/22, 12:24 PM

## 2022-05-22 ENCOUNTER — Ambulatory Visit: Payer: PPO | Admitting: Physical Therapy

## 2022-05-22 ENCOUNTER — Encounter: Payer: Self-pay | Admitting: Physical Therapy

## 2022-05-22 DIAGNOSIS — R262 Difficulty in walking, not elsewhere classified: Secondary | ICD-10-CM

## 2022-05-22 DIAGNOSIS — M6281 Muscle weakness (generalized): Secondary | ICD-10-CM

## 2022-05-22 DIAGNOSIS — R2681 Unsteadiness on feet: Secondary | ICD-10-CM

## 2022-05-22 DIAGNOSIS — R2689 Other abnormalities of gait and mobility: Secondary | ICD-10-CM

## 2022-05-22 NOTE — Therapy (Signed)
OUTPATIENT PHYSICAL THERAPY TREATMENT NOTE   Patient Name: [name] [name] MRN: [id number] DOB:1934-05-30, 88 y.o., male Today's Date: 05/22/2022  PCP: [name], MD REFERRING PROVIDER: [name], MD   PT End of Session - 05/22/22 1625     Visit Number 8    Number of Visits 24    Date for PT Re-Evaluation 06/11/22    Authorization Type [employer]    Authorization Time Period -06/11/22    Progress Note Due on Visit 10    PT Start Time 1349    PT Stop Time 1430    PT Time Calculation (min) 41 min    Equipment Utilized During Treatment Gait belt    Activity Tolerance Patient tolerated treatment well    Behavior During Therapy [facility] for tasks assessed/performed              Past Medical History:  Diagnosis Date   Aortic stenosis    a. 01/2016 echo: mod AS; b. 04/2018 Cardiac MRI: sev AS; c. 05/2018 s/p mech AVR @ time of myomectomy; d. 07/2018 Echo: Mild AS/AI.   Atherosclerosis of aorta ([place])    by CT scan in past   Atrial fibrillation (HCC)    and aflutter. pt has a left atrial circuit that is not ablated. was on amiodarone-stopped, now use rate control.    Bladder cancer ([place])    hx; treated with BCG in past    Carotid artery disease ([place])    There was calcified plaque but no stenosis by carotid artery screening done at [facility] October, 2013   CHB (complete heart block) [facility])    a. 05/2018 s/p BSX VVIR PPM ([facility]) in setting of bradycardia following myomectomy and AVR/MVR.   Diabetes mellitus    type not specified   Diabetic neuropathy ([place])    feet   Elevated liver enzymes    over time; hx   Excessive sweating    Glaucoma    Gout    Head injury    when slipped on ice 2004-2005. stabilized and back on Coumadin    Headache    migraines - distant past   History of cardiac cath    a. 05/2018 Cath ([facility]): Non-obstructive CAD. Mildly elevated filling pressures w/ nl CI.   HOCM (hypertrophic obstructive cardiomyopathy)  ([place])    a. 01/2016 Echo: EF 65-70%, no rwma, LVOT gradient of 80-32mHg, mod AS, SAM; b. 11/2017 Echo: EF 55-60%, near cavity obliteration in systole, mod AS, mild MS; c. 04/2018 Card MRI ([facility]): Sev LVH, EF >70%, Sev AS, LVOT obs w/ Sev MR, mild to mod TR/PR, mid-myocardial HE in basal-mid anteroseptum and inferoseptum; d. 05/2018 s/p Septal myomectomy; e. 07/2018 Echo: EF 60-65%. PASP 374mg.   Homocystinemia ([facility]organ   elevated, mild    Hypercholesterolemia    treated.    Hypertension    Infection of right inner ear    Kidney mass    a. s/p laproscopic surgery woth cryoablation of a mass outside [facility] Chest: 2cm R kidney mass.   Mediastinal adenopathy    a. 12/208 CT Chest: up to 17m72mLL lung nodule. 1.5-1.7cm subcarinal/mediastinal adenopahty/right paratracheal lymph node; b. 11/2018 CT Chest: 2.5-3cm mediastinal adenopathy.   Mitral regurgitation    a. 04/2018 Cardiac MRI: sev MR; b. 05/2018 s/p mech MVR @ time of myomectomy; c. 07/2018 Echo: Mild MS.   Motion sickness    ocean boats   Nausea  Nonobstructive coronary artery disease    a. 05/2018 Cath ([facility]): nonobs CAD.   Obstructive sleep apnea    CPAP started successfully 2014   Orthostatic hypotension    a. orthostatic. dehydration. hospitalized 11/11   Sleep apnea    Significant obstructive sleep apnea diagnosed in August, 2012, the patient is to receive CPAP    Stroke [facility])    "2 old strokes" CT and MRI. [place] [facility] 11/11. diagnosis was dehydration, no acutal reports.    TSH elevation    on synthroid historically    Unsteady gait    August, 2012   Past Surgical History:  Procedure Laterality Date   BLADDER SURGERY     CATARACT EXTRACTION W/PHACO Left 05/11/2015   Procedure: CATARACT EXTRACTION PHACO AND INTRAOCULAR LENS PLACEMENT ([place]);  Surgeon: Leandrew Koyanagi, MD;  Location: [place];  Service: Ophthalmology;  Laterality: Left;  DIABETIC - oral meds, CPAP   COLONOSCOPY  WITH PROPOFOL N/A 04/14/2019   Procedure: COLONOSCOPY WITH PROPOFOL;  Surgeon: [name], MD;  Location: [facility] ENDOSCOPY;  Service: Endoscopy;  Laterality: N/A;   ESOPHAGOGASTRODUODENOSCOPY (EGD) WITH PROPOFOL N/A 07/01/2018   Procedure: ESOPHAGOGASTRODUODENOSCOPY (EGD) WITH PROPOFOL;  Surgeon: [name], MD;  Location: [facility] ENDOSCOPY;  Service: Endoscopy;  Laterality: N/A;   INSERT / REPLACE / [place]     "froze mass"   MECHANICAL AORTIC AND MITRAL VALVE REPLACEMENT  05/2018   [facility]    TONSILLECTOMY     VALVE REPLACEMENT     Patient Active Problem List   Diagnosis Date Noted   Other abnormalities of gait and mobility 11/15/2021   Depression 11/15/2021   Encounter for fitting and adjustment of hearing aid 11/15/2021   Abnormal CT scan    Abnormal findings on radiological examination of gastrointestinal tract    Polyp of colon    Cancer of right kidney ([place]) 12/10/2018   Pulmonary nodules 12/01/2018   Mediastinal lymphadenopathy 12/01/2018   Heart block AV third degree ([place]) 10/07/2018   Pacemaker 10/07/2018   H/O aortic valve replacement 10/07/2018   Blood in stool    Bleeding duodenal ulcer    GIB (gastrointestinal bleeding) 06/30/2018   Acute upper GI bleed    Decreased activities of daily living (ADL) 06/09/2018   Decreased strength, endurance, and mobility 06/09/2018   Pleural effusion, right 06/09/2018   S/P AVR (aortic valve replacement) 06/08/2018   H/O ventricular septal myectomy 06/08/2018   Chronic anticoagulation 06/02/2018   Heart murmur, systolic 40/98/1191   H/O: rheumatic fever 03/21/2017   Tinnitus 10/30/2016   On warfarin therapy 07/06/2015   Arteriosclerosis of coronary artery 06/13/2015   Carcinoma in situ of bladder 06/13/2015   Diabetes mellitus, type 2 ([place]) 06/13/2015   Diabetic neuropathy ([place]) 06/13/2015   Dizziness 06/13/2015   Essential (primary) hypertension 06/13/2015   Fatigue 06/13/2015    Glaucoma 06/13/2015   Hypercholesteremia 06/13/2015   Male hypogonadism 06/13/2015   Adult hypothyroidism 06/13/2015   Arthritis, degenerative 06/13/2015   CAP (community acquired pneumonia) 06/13/2015   Adenocarcinoma, renal cell ([place]) 06/13/2015   Benign head tremor 12/13/2014   Carotid arterial disease ([place]) 07/12/2014   Decreased cardiac output 07/12/2014   Cardiomyopathy, hypertrophic obstructive ([place]) 07/12/2014   Head injuries 07/12/2014   HOCM (hypertrophic obstructive cardiomyopathy) ([place])    Encounter for therapeutic drug monitoring 11/16/2013   Obstructive sleep apnea    Benign prostatic hypertrophy without urinary obstruction 06/09/2012   History of neoplasm  of bladder 06/09/2012   Head injury    Atherosclerosis of aorta (HCC)    Hypotension    Stroke (HCC)    Unsteady gait    Excessive sweating    Nausea    ORTHOSTATIC HYPOTENSION, HX OF 09/01/2010   Overweight 03/07/2009   MITRAL REGURGITATION 03/05/2009   Chronic atrial fibrillation ([place]) 03/05/2009   Atrial fibrillation and flutter ([place]) 03/05/2009   Mitral and aortic incompetence 03/05/2009      REFERRING DIAG: Imbalance  THERAPY DIAG:  Difficulty in walking, not elsewhere classified  Other abnormalities of gait and mobility  Unsteadiness on feet  Muscle weakness (generalized)  Rationale for Evaluation and Treatment Rehabilitation  PERTINENT HISTORY:  Pt report she has been having balance issues secondary to his ears. Pt was in the artilerary. 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 Pt sold a home in april and has fallen 5 times since then. Pt tripped over a piece of wood in back yard and fell on one occassion. Pt has history of cardiac rehab [facility] at [facility]. Pt has a very difficult time with balance without his cane. He has difficulty with balance when heturns his head.   PRECAUTIONS: Fall  SUBJECTIVE: Pt doing well. Denies any new falls this week; He  reports putting in new batteries in hearing aids but still can't hear out of them.   Presents to therapy with his wife;  PAIN:  Are you having pain? No     TODAY'S TREATMENT:   Exercise/Activity Sets/Reps/Time/ Resistance Assistance Charge type Comments  Nustep  2 min ea level 1 level 2 to level 4 With adjusting seat and levels  TE To ipmrove LE endurance and strength   1 LE airex, 1 LE step  2 x 30 sec ea  CGA NMR Some UE assit required when off balance, improved tolerance with practice   STS with airex pad and no UE support  2 x 5 reps  CGA TA Good ant weight shift, no UE required   Step taps  2 x 10 ea LE  CGA  TA Cues for no UE assist   Balance course: step up from TB pad to step, step off, then step to airex and turn 180 on airex, repeat in reverse  X 4 laps  CGA ( [place] on first round)  NMR Challenged for no upper extremity utilization.  Overall completed well without significant loss of balance.                                      Treatment Provided this session   Pt educated throughout session about proper posture and technique with exercises. Improved exercise technique, movement at target joints, use of target muscles after min to mod verbal, visual, tactile cues. Note: Portions of this document were prepared using Dragon voice recognition software and although reviewed may contain unintentional dictation errors in syntax, grammar, or spelling.     PATIENT EDUCATION: Education details: Pt given verbal cuing on proper demonstration of technique during performance of exercises and as part of HEP. Person educated: Patient Education method: Explanation, Demonstration, Tactile cues, and Verbal cues Education comprehension: verbalized understanding, returned demonstration, and verbal cues required   HOME EXERCISE PROGRAM:  Access Code: [email or web address] URL: [email or web address] Date: 04/26/2022 Prepared by: [place]  Exercises - Standing March with Counter  Support  - 1  x daily - 7 x weekly - 3 sets - 10 reps - Standing Knee Flexion with Unilateral Counter Support  - 1 x daily - 7 x weekly - 3 sets - 10 reps - Heel Toe Raises with Unilateral Counter Support  - 1 x daily - 7 x weekly - 3 sets - 10 reps - Standing Hip Abduction with Unilateral Counter Support  - 1 x daily - 7 x weekly - 3 sets - 10 reps - Standing Hip Extension with Unilateral Counter Support  - 1 x daily - 7 x weekly - 3 sets - 10 reps   PT Short Term Goals        PT SHORT TERM GOAL #1   Title Patient will be independent in home exercise program to improve strength/mobility for better functional independence with ADLs.    Baseline no HEP    Time 4    Period Weeks    Status New    Target Date 04/16/22              PT Long Term Goals       PT LONG TERM GOAL #1   Title Patient will increase FOTO score to equal to or greater than   48  to demonstrate statistically significant improvement in mobility and quality of life.    Baseline 42    Time 8    Period Weeks    Status New    Target Date 06/11/22      PT LONG TERM GOAL #2   Title Patient  will complete five times sit to stand test in < 17 seconds indicating an increased LE strength and improved balance.    Baseline 23.53 sec    Time 12    Period Weeks    Status New    Target Date 06/11/22      PT LONG TERM GOAL #3   Title Patient will increase Berg Balance score by > 6 points to demonstrate decreased fall risk during functional activities.    Baseline 34    Time 12    Period Weeks    Status New      PT LONG TERM GOAL #4   Title Patient will increase six minute walk test distance to >1000 for progression to community ambulator and improve gait ability    Baseline test session 2    Time 12    Period Weeks    Status New    Target Date 06/11/22      PT LONG TERM GOAL #5   Title Patient will increase 10 meter walk test to >1.39ms as to improve gait speed for better community ambulation and to reduce fall  risk.    Baseline to test session 2    Time 12    Period Weeks    Status New    Target Date 06/11/22              Plan     Clinical Impression Statement Continued with current plan of care as laid out in evaluation and recent prior sessions. Pt remains motivated to advance progress toward goals in order to maximize independence and safety at home. Pt requires high level assistance and cuing for completion of exercises in order to provide adequate level of stimulation and perturbation. Author allows pt as much opportunity as possible to perform independent righting strategies, only stepping in when pt is unable to prevent falling to floor. Pt closely monitored throughout session for safe vitals response and  to maximize patient safety during interventions.  Patient did not have any instances of lightheadedness or dizziness this session.  Pt continues to demonstrate progress toward goals AEB progression of some interventions this date either in volume or intensity.       Personal Factors and Comorbidities Age;Comorbidity 1;Comorbidity 2;Comorbidity 3+    Comorbidities CAD, Peripheral neuropathy, T2DM, HTN,    Examination-Activity Limitations Caring for Others;Carry;Locomotion Level;Stairs;Stand    Examination-Participation Restrictions Yard Work;Shop;Community Activity    Stability/Clinical Decision Making Evolving/Moderate complexity    Rehab Potential Fair    PT Frequency 2x / week    PT Duration 12 weeks    PT Treatment/Interventions ADLs/Self Care Home Management;Moist Heat;DME Instruction;Stair training;Functional mobility training;Therapeutic activities;Therapeutic exercise;Balance training;Neuromuscular re-education;Patient/family education;Wheelchair mobility training;Manual techniques;Dry needling;Energy conservation    PT Next Visit Plan test 6MWT and 10MWT, also establish HEP for 2 weeks pt will be at [place] to establish next session    Consulted and  Agree with Plan of Care Patient;Family member/caregiver              Particia Lather PT, DPT  05/22/22 4:26 PM   05/22/22, 4:26 PM

## 2022-05-24 ENCOUNTER — Ambulatory Visit: Payer: PPO | Admitting: Physical Therapy

## 2022-05-24 DIAGNOSIS — R2681 Unsteadiness on feet: Secondary | ICD-10-CM

## 2022-05-24 DIAGNOSIS — M6281 Muscle weakness (generalized): Secondary | ICD-10-CM

## 2022-05-24 DIAGNOSIS — R262 Difficulty in walking, not elsewhere classified: Secondary | ICD-10-CM

## 2022-05-24 DIAGNOSIS — R2689 Other abnormalities of gait and mobility: Secondary | ICD-10-CM

## 2022-05-24 NOTE — Therapy (Signed)
OUTPATIENT PHYSICAL THERAPY TREATMENT NOTE   Patient Name: [name] [name] MRN: [id number] DOB:1934/09/21, 88 y.o., male Today's Date: 05/24/2022  PCP: [name], MD REFERRING PROVIDER: [name], MD   PT End of Session - 05/24/22 1158     Visit Number 9    Number of Visits 24    Date for PT Re-Evaluation 06/11/22    Authorization Type [employer]    Authorization Time Period -06/11/22    Progress Note Due on Visit 10    PT Start Time 1152    PT Stop Time 1230    PT Time Calculation (min) 38 min    Equipment Utilized During Treatment Gait belt    Activity Tolerance Patient tolerated treatment well    Behavior During Therapy [employer] for tasks assessed/performed              Past Medical History:  Diagnosis Date   Aortic stenosis    a. 01/2016 echo: mod AS; b. 04/2018 Cardiac MRI: sev AS; c. 05/2018 s/p mech AVR @ time of myomectomy; d. 07/2018 Echo: Mild AS/AI.   Atherosclerosis of aorta ([place])    by CT scan in past   Atrial fibrillation (HCC)    and aflutter. pt has a left atrial circuit that is not ablated. was on amiodarone-stopped, now use rate control.    Bladder cancer ([place])    hx; treated with BCG in past    Carotid artery disease ([place])    There was calcified plaque but no stenosis by carotid artery screening done at [facility] October, 2013   CHB (complete heart block) [facility])    a. 05/2018 s/p BSX VVIR PPM ([facility]) in setting of bradycardia following myomectomy and AVR/MVR.   Diabetes mellitus    type not specified   Diabetic neuropathy ([place])    feet   Elevated liver enzymes    over time; hx   Excessive sweating    Glaucoma    Gout    Head injury    when slipped on ice 2004-2005. stabilized and back on Coumadin    Headache    migraines - distant past   History of cardiac cath    a. 05/2018 Cath ([facility]): Non-obstructive CAD. Mildly elevated filling pressures w/ nl CI.   HOCM (hypertrophic obstructive cardiomyopathy)  ([place])    a. 01/2016 Echo: EF 65-70%, no rwma, LVOT gradient of 80-4mHg, mod AS, SAM; b. 11/2017 Echo: EF 55-60%, near cavity obliteration in systole, mod AS, mild MS; c. 04/2018 Card MRI ([facility]): Sev LVH, EF >70%, Sev AS, LVOT obs w/ Sev MR, mild to mod TR/PR, mid-myocardial HE in basal-mid anteroseptum and inferoseptum; d. 05/2018 s/p Septal myomectomy; e. 07/2018 Echo: EF 60-65%. PASP 314mg.   [place]   elevated, mild    Hypercholesterolemia    treated.    Hypertension    Infection of right inner ear    Kidney mass    a. s/p laproscopic surgery woth cryoablation of a mass outside [facility]: 2cm R kidney mass.   Mediastinal adenopathy    a. 12/208 CT Chest: up to 37m55mLL lung nodule. 1.5-1.7cm subcarinal/mediastinal adenopahty/right paratracheal lymph node; b. 11/2018 CT Chest: 2.5-3cm mediastinal adenopathy.   Mitral regurgitation    a. 04/2018 Cardiac MRI: sev MR; b. 05/2018 s/p mech MVR @ time of myomectomy; c. 07/2018 Echo: Mild MS.   Motion sickness    ocean boats   Nausea  Nonobstructive coronary artery disease    a. 05/2018 Cath ([facility]): nonobs CAD.   Obstructive sleep apnea    CPAP started successfully 2014   Orthostatic hypotension    a. orthostatic. dehydration. hospitalized 11/11   Sleep apnea    Significant obstructive sleep apnea diagnosed in August, 2012, the patient is to receive CPAP    Stroke [facility])    "2 old strokes" CT and MRI. [place] [facility] 11/11. diagnosis was dehydration, no acutal reports.    TSH elevation    on synthroid historically    Unsteady gait    August, 2012   Past Surgical History:  Procedure Laterality Date   BLADDER SURGERY     CATARACT EXTRACTION W/PHACO Left 05/11/2015   Procedure: CATARACT EXTRACTION PHACO AND INTRAOCULAR LENS PLACEMENT ([place]);  Surgeon: Leandrew Koyanagi, MD;  Location: [place];  Service: Ophthalmology;  Laterality: Left;  DIABETIC - oral meds, CPAP   COLONOSCOPY  WITH PROPOFOL N/A 04/14/2019   Procedure: COLONOSCOPY WITH PROPOFOL;  Surgeon: [name], MD;  Location: [facility] ENDOSCOPY;  Service: Endoscopy;  Laterality: N/A;   ESOPHAGOGASTRODUODENOSCOPY (EGD) WITH PROPOFOL N/A 07/01/2018   Procedure: ESOPHAGOGASTRODUODENOSCOPY (EGD) WITH PROPOFOL;  Surgeon: [name], MD;  Location: [facility] ENDOSCOPY;  Service: Endoscopy;  Laterality: N/A;   INSERT / REPLACE / [place]     "froze mass"   MECHANICAL AORTIC AND MITRAL VALVE REPLACEMENT  05/2018   [facility]    TONSILLECTOMY     VALVE REPLACEMENT     Patient Active Problem List   Diagnosis Date Noted   Other abnormalities of gait and mobility 11/15/2021   Depression 11/15/2021   Encounter for fitting and adjustment of hearing aid 11/15/2021   Abnormal CT scan    Abnormal findings on radiological examination of gastrointestinal tract    Polyp of colon    Cancer of right kidney ([place]) 12/10/2018   Pulmonary nodules 12/01/2018   Mediastinal lymphadenopathy 12/01/2018   Heart block AV third degree ([place]) 10/07/2018   Pacemaker 10/07/2018   H/O aortic valve replacement 10/07/2018   Blood in stool    Bleeding duodenal ulcer    GIB (gastrointestinal bleeding) 06/30/2018   Acute upper GI bleed    Decreased activities of daily living (ADL) 06/09/2018   Decreased strength, endurance, and mobility 06/09/2018   Pleural effusion, right 06/09/2018   S/P AVR (aortic valve replacement) 06/08/2018   H/O ventricular septal myectomy 06/08/2018   Chronic anticoagulation 06/02/2018   Heart murmur, systolic 40/98/1191   H/O: rheumatic fever 03/21/2017   Tinnitus 10/30/2016   On warfarin therapy 07/06/2015   Arteriosclerosis of coronary artery 06/13/2015   Carcinoma in situ of bladder 06/13/2015   Diabetes mellitus, type 2 ([place]) 06/13/2015   Diabetic neuropathy ([place]) 06/13/2015   Dizziness 06/13/2015   Essential (primary) hypertension 06/13/2015   Fatigue 06/13/2015    Glaucoma 06/13/2015   Hypercholesteremia 06/13/2015   Male hypogonadism 06/13/2015   Adult hypothyroidism 06/13/2015   Arthritis, degenerative 06/13/2015   CAP (community acquired pneumonia) 06/13/2015   Adenocarcinoma, renal cell ([place]) 06/13/2015   Benign head tremor 12/13/2014   Carotid arterial disease ([place]) 07/12/2014   Decreased cardiac output 07/12/2014   Cardiomyopathy, hypertrophic obstructive ([place]) 07/12/2014   Head injuries 07/12/2014   HOCM (hypertrophic obstructive cardiomyopathy) ([place])    Encounter for therapeutic drug monitoring 11/16/2013   Obstructive sleep apnea    Benign prostatic hypertrophy without urinary obstruction 06/09/2012   History of neoplasm  of bladder 06/09/2012   Head injury    Atherosclerosis of aorta (HCC)    Hypotension    Stroke (HCC)    Unsteady gait    Excessive sweating    Nausea    ORTHOSTATIC HYPOTENSION, HX OF 09/01/2010   Overweight 03/07/2009   MITRAL REGURGITATION 03/05/2009   Chronic atrial fibrillation ([place]) 03/05/2009   Atrial fibrillation and flutter ([place]) 03/05/2009   Mitral and aortic incompetence 03/05/2009      REFERRING DIAG: Imbalance  THERAPY DIAG:  Difficulty in walking, not elsewhere classified  Other abnormalities of gait and mobility  Unsteadiness on feet  Muscle weakness (generalized)  Rationale for Evaluation and Treatment Rehabilitation  PERTINENT HISTORY:  Pt report she has been having balance issues secondary to his ears. Pt was in the artilerary. 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 Pt sold a home in april and has fallen 5 times since then. Pt tripped over a piece of wood in back yard and fell on one occassion. Pt has history of cardiac rehab here at [facility]. Pt has a very difficult time with balance without his cane. He has difficulty with balance when heturns his head.   PRECAUTIONS: Fall  SUBJECTIVE: Pt doing well. Denies any new falls this week; He  reports putting in new batteries in hearing aids but still can't hear out of them.   Presents to therapy with his wife;  PAIN:  Are you having pain? No     TODAY'S TREATMENT:  SPO2% 93 % following balance course, increased to 96% with 2 min of seated rest   Exercise/Activity Sets/Reps/Time/ Resistance Assistance Charge type Comments  Ambulation with SPC and AW   300 fet with 2# AW  CGA TE To ipmrove LE endurance and strength. Fatigue noted and ambulation quality ( step length and cafence) deteriorated around 200 feet but no LOB noted.   1 LE airex, 1 LE step  2 x 30 sec ea  CGA NMR Some UE assit required when off balance, improved tolerance with practice   STS with airex pad and no UE support  2 x 7 reps  CGA TA Good ant weight shift, no UE required   Step taps  2 x 10 ea LE  CGA  TA Cues for no UE assist   Balance course: 1/2 foam, hurdle, airex, 1/2 foam  X 4 laps no UE on 3 laps  CGA ( [place] on first round)  NMR Challenged for no upper extremity utilization.  Overall completed well without significant loss of balance.SPO2% 93 % following balance course, increased to 96% with 2 min of seated rest                                       Treatment Provided this session   Pt educated throughout session about proper posture and technique with exercises. Improved exercise technique, movement at target joints, use of target muscles after min to mod verbal, visual, tactile cues. Note: Portions of this document were prepared using Dragon voice recognition software and although reviewed may contain unintentional dictation errors in syntax, grammar, or spelling.     PATIENT EDUCATION: Education details: Pt given verbal cuing on proper demonstration of technique during performance of exercises and as part of HEP. Person educated: Patient Education method: Explanation, Demonstration, Tactile cues, and Verbal cues Education comprehension: verbalized understanding,  returned demonstration, and  verbal cues required   HOME EXERCISE PROGRAM:  Access Code: [place]: [email or web address] Date: 04/26/2022 Prepared by: [place]  Exercises - Standing March with Counter Support  - 1 x daily - 7 x weekly - 3 sets - 10 reps - Standing Knee Flexion with Unilateral Counter Support  - 1 x daily - 7 x weekly - 3 sets - 10 reps - Heel Toe Raises with Unilateral Counter Support  - 1 x daily - 7 x weekly - 3 sets - 10 reps - Standing Hip Abduction with Unilateral Counter Support  - 1 x daily - 7 x weekly - 3 sets - 10 reps - Standing Hip Extension with Unilateral Counter Support  - 1 x daily - 7 x weekly - 3 sets - 10 reps   PT Short Term Goals        PT SHORT TERM GOAL #1   Title Patient will be independent in home exercise program to improve strength/mobility for better functional independence with ADLs.    Baseline no HEP    Time 4    Period Weeks    Status New    Target Date 04/16/22              PT Long Term Goals       PT LONG TERM GOAL #1   Title Patient will increase FOTO score to equal to or greater than   48  to demonstrate statistically significant improvement in mobility and quality of life.    Baseline 42    Time 8    Period Weeks    Status New    Target Date 06/11/22      PT LONG TERM GOAL #2   Title Patient  will complete five times sit to stand test in < 17 seconds indicating an increased LE strength and improved balance.    Baseline 23.53 sec    Time 12    Period Weeks    Status New    Target Date 06/11/22      PT LONG TERM GOAL #3   Title Patient will increase Berg Balance score by > 6 points to demonstrate decreased fall risk during functional activities.    Baseline 34    Time 12    Period Weeks    Status New      PT LONG TERM GOAL #4   Title Patient will increase six minute walk test distance to >1000 for progression to community ambulator and improve gait ability    Baseline test session 2    Time 12    Period Weeks     Status New    Target Date 06/11/22      PT LONG TERM GOAL #5   Title Patient will increase 10 meter walk test to >1.56ms as to improve gait speed for better community ambulation and to reduce fall risk.    Baseline to test session 2    Time 12    Period Weeks    Status New    Target Date 06/11/22              Plan     Clinical Impression Statement Continued with current plan of care as laid out in evaluation and recent prior sessions. Pt remains motivated to advance progress toward goals in order to maximize independence and safety at home. Pt requires high level assistance and cuing for completion of exercises in order to provide adequate level of stimulation and perturbation.  Author allows pt as much opportunity as possible to perform independent righting strategies, only stepping in when pt is unable to prevent falling to floor. Pt closely monitored throughout session for safe vitals response and to maximize patient safety during interventions.  Patient did not have any instances of lightheadedness or dizziness this session.  Pt continues to demonstrate progress toward goals AEB progression of some interventions this date either in volume or intensity.       Personal Factors and Comorbidities Age;Comorbidity 1;Comorbidity 2;Comorbidity 3+    Comorbidities CAD, Peripheral neuropathy, T2DM, HTN,    Examination-Activity Limitations Caring for Others;Carry;Locomotion Level;Stairs;Stand    Examination-Participation Restrictions Yard Work;Shop;Community Activity    Stability/Clinical Decision Making Evolving/Moderate complexity    Rehab Potential Fair    PT Frequency 2x / week    PT Duration 12 weeks    PT Treatment/Interventions ADLs/Self Care Home Management;Moist Heat;DME Instruction;Stair training;Functional mobility training;Therapeutic activities;Therapeutic exercise;Balance training;Neuromuscular re-education;Patient/family education;Wheelchair mobility training;Manual  techniques;Dry needling;Energy conservation    PT Next Visit Plan test 6MWT and 10MWT, also establish HEP for 2 weeks pt will be at [place] Provided visit 2    Consulted and Agree with Plan of Care Patient;Family member/caregiver              Particia Lather PT, DPT  05/24/22 1:18 PM   05/24/22, 1:18 PM

## 2022-05-29 ENCOUNTER — Ambulatory Visit: Payer: PPO | Admitting: Physical Therapy

## 2022-05-29 DIAGNOSIS — M6281 Muscle weakness (generalized): Secondary | ICD-10-CM

## 2022-05-29 DIAGNOSIS — R262 Difficulty in walking, not elsewhere classified: Secondary | ICD-10-CM

## 2022-05-29 DIAGNOSIS — R2689 Other abnormalities of gait and mobility: Secondary | ICD-10-CM

## 2022-05-29 DIAGNOSIS — R2681 Unsteadiness on feet: Secondary | ICD-10-CM

## 2022-05-29 NOTE — Therapy (Signed)
OUTPATIENT PHYSICAL THERAPY TREATMENT NOTE/ Physical Therapy Progress Note   Dates of reporting period  03/19/22   to   05/29/22    Patient Name: [name] [name] MRN: [id number] DOB:[birth year], 88 y.o., male Today's Date: 05/29/2022  PCP: [name], MD REFERRING PROVIDER: [name], MD   PT End of Session - 05/29/22 1157     Visit Number 10    Number of Visits 24    Date for PT Re-Evaluation 06/11/22    Authorization Type [employer]    Authorization Time Period -06/11/22    Progress Note Due on Visit 10    PT Start Time 1155    PT Stop Time 1236    PT Time Calculation (min) 41 min    Equipment Utilized During Treatment Gait belt    Activity Tolerance Patient tolerated treatment well    Behavior During Therapy [facility] for tasks assessed/performed               Past Medical History:  Diagnosis Date   Aortic stenosis    a. 01/2016 echo: mod AS; b. 04/2018 Cardiac MRI: sev AS; c. 05/2018 s/p mech AVR @ time of myomectomy; d. 07/2018 Echo: Mild AS/AI.   Atherosclerosis of aorta ([place])    by CT scan in past   Atrial fibrillation (HCC)    and aflutter. pt has a left atrial circuit that is not ablated. was on amiodarone-stopped, now use rate control.    Bladder cancer ([place])    hx; treated with BCG in past    Carotid artery disease ([place])    There was calcified plaque but no stenosis by carotid artery screening done at [facility] October, 2013   CHB (complete heart block) [facility])    a. 05/2018 s/p BSX VVIR PPM ([facility]) in setting of bradycardia following myomectomy and AVR/MVR.   Diabetes mellitus    type not specified   Diabetic neuropathy ([place])    feet   Elevated liver enzymes    over time; hx   Excessive sweating    Glaucoma    Gout    Head injury    when slipped on ice 2004-2005. stabilized and back on Coumadin    Headache    migraines - distant past   History of cardiac cath    a. 05/2018 Cath ([facility]): Non-obstructive CAD.  Mildly elevated filling pressures w/ nl CI.   HOCM (hypertrophic obstructive cardiomyopathy) ([place])    a. 01/2016 Echo: EF 65-70%, no rwma, LVOT gradient of 80-14mHg, mod AS, SAM; b. 11/2017 Echo: EF 55-60%, near cavity obliteration in systole, mod AS, mild MS; c. 04/2018 Card MRI ([facility]): Sev LVH, EF >70%, Sev AS, LVOT obs w/ Sev MR, mild to mod TR/PR, mid-myocardial HE in basal-mid anteroseptum and inferoseptum; d. 05/2018 s/p Septal myomectomy; e. 07/2018 Echo: EF 60-65%. PASP 379mg.   [place]   elevated, mild    Hypercholesterolemia    treated.    Hypertension    Infection of right inner ear    Kidney mass    a. s/p laproscopic surgery woth cryoablation of a mass outside kidney [facility]2018 CT Chest: 2cm R kidney mass.   Mediastinal adenopathy    a. 12/208 CT Chest: up to 65m49mLL lung nodule. 1.5-1.7cm subcarinal/mediastinal adenopahty/right paratracheal lymph node; b. 11/2018 CT Chest: 2.5-3cm mediastinal adenopathy.   Mitral regurgitation    a. 04/2018 Cardiac MRI: sev MR; b. 05/2018 s/p mech MVR @ time of  myomectomy; c. 07/2018 Echo: Mild MS.   Motion sickness    ocean boats   Nausea    Nonobstructive coronary artery disease    a. 05/2018 Cath ([facility]): nonobs CAD.   Obstructive sleep apnea    CPAP started successfully 2014   Orthostatic hypotension    a. orthostatic. dehydration. hospitalized 11/11   Sleep apnea    Significant obstructive sleep apnea diagnosed in August, 2012, the patient is to receive CPAP    Stroke [facility])    "2 old strokes" CT and MRI. [facility] hospital 11/11. diagnosis was dehydration, no acutal reports.    TSH elevation    on synthroid historically    Unsteady gait    August, 2012   Past Surgical History:  Procedure Laterality Date   BLADDER SURGERY     CATARACT EXTRACTION W/PHACO Left 05/11/2015   Procedure: CATARACT EXTRACTION PHACO AND INTRAOCULAR LENS PLACEMENT ([name]);  Surgeon: Leandrew Koyanagi, MD;  Location: [place];  Service: Ophthalmology;  Laterality: Left;  DIABETIC - oral meds, CPAP   COLONOSCOPY WITH PROPOFOL N/A 04/14/2019   Procedure: COLONOSCOPY WITH PROPOFOL;  Surgeon: [name], MD;  Location: [facility] ENDOSCOPY;  Service: Endoscopy;  Laterality: N/A;   ESOPHAGOGASTRODUODENOSCOPY (EGD) WITH PROPOFOL N/A 07/01/2018   Procedure: ESOPHAGOGASTRODUODENOSCOPY (EGD) WITH PROPOFOL;  Surgeon: [name], MD;  Location: [facility] ENDOSCOPY;  Service: Endoscopy;  Laterality: N/A;   INSERT / REPLACE / [place]     "froze mass"   MECHANICAL AORTIC AND MITRAL VALVE REPLACEMENT  05/2018   [facility]    TONSILLECTOMY     VALVE REPLACEMENT     Patient Active Problem List   Diagnosis Date Noted   Other abnormalities of gait and mobility 11/15/2021   Depression 11/15/2021   Encounter for fitting and adjustment of hearing aid 11/15/2021   Abnormal CT scan    Abnormal findings on radiological examination of gastrointestinal tract    Polyp of colon    Cancer of right kidney ([place]) 12/10/2018   Pulmonary nodules 12/01/2018   Mediastinal lymphadenopathy 12/01/2018   Heart block AV third degree ([place]) 10/07/2018   Pacemaker 10/07/2018   H/O aortic valve replacement 10/07/2018   Blood in stool    Bleeding duodenal ulcer    GIB (gastrointestinal bleeding) 06/30/2018   Acute upper GI bleed    Decreased activities of daily living (ADL) 06/09/2018   Decreased strength, endurance, and mobility 06/09/2018   Pleural effusion, right 06/09/2018   S/P AVR (aortic valve replacement) 06/08/2018   H/O ventricular septal myectomy 06/08/2018   Chronic anticoagulation 06/02/2018   Heart murmur, systolic 32/44/0102   H/O: rheumatic fever 03/21/2017   Tinnitus 10/30/2016   On warfarin therapy 07/06/2015   Arteriosclerosis of coronary artery 06/13/2015   Carcinoma in situ of bladder 06/13/2015   Diabetes mellitus, type 2 ([place]) 06/13/2015   Diabetic neuropathy ([place]) 06/13/2015    Dizziness 06/13/2015   Essential (primary) hypertension 06/13/2015   Fatigue 06/13/2015   Glaucoma 06/13/2015   Hypercholesteremia 06/13/2015   Male hypogonadism 06/13/2015   Adult hypothyroidism 06/13/2015   Arthritis, degenerative 06/13/2015   CAP (community acquired pneumonia) 06/13/2015   Adenocarcinoma, renal cell ([place]) 06/13/2015   Benign head tremor 12/13/2014   Carotid arterial disease ([place]) 07/12/2014   Decreased cardiac output 07/12/2014   Cardiomyopathy, hypertrophic obstructive ([place]) 07/12/2014   Head injuries 07/12/2014   HOCM (hypertrophic obstructive cardiomyopathy) ([place])    Encounter for therapeutic drug monitoring  11/16/2013   Obstructive sleep apnea    Benign prostatic hypertrophy without urinary obstruction 06/09/2012   History of neoplasm of bladder 06/09/2012   Head injury    Atherosclerosis of aorta (HCC)    Hypotension    Stroke (HCC)    Unsteady gait    Excessive sweating    Nausea    ORTHOSTATIC HYPOTENSION, HX OF 09/01/2010   Overweight 03/07/2009   MITRAL REGURGITATION 03/05/2009   Chronic atrial fibrillation ([place]) 03/05/2009   Atrial fibrillation and flutter ([place]) 03/05/2009   Mitral and aortic incompetence 03/05/2009      REFERRING DIAG: Imbalance  THERAPY DIAG:  Difficulty in walking, not elsewhere classified  Other abnormalities of gait and mobility  Unsteadiness on feet  Muscle weakness (generalized)  Rationale for Evaluation and Treatment Rehabilitation  PERTINENT HISTORY:  Pt report she has been having balance issues secondary to his ears. Pt was in the artilerary. 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 Pt sold a home in april and has fallen 5 times since then. Pt tripped over a piece of wood in back yard and fell on one occassion. Pt has history of cardiac rehab [facility] at [facility]. Pt has a very difficult time with balance without his cane. He has difficulty with balance when heturns his head.    PRECAUTIONS: Fall  SUBJECTIVE: Pt doing well. Denies any new falls this week; He reports putting in new batteries in hearing aids but still can't hear out of them.   Presents to therapy with his wife;  PAIN:  Are you having pain? No     TODAY'S TREATMENT:  Physical therapy treatment session today consisted of completing assessment of goals and administration of testing as demonstrated in flow sheet. Addition treatments may be found below.    Exercise/Activity Sets/Reps/Time/ Resistance Assistance Charge type Comments  FOTO 54    Met goal   6MWT: 725 10MWT: .58 m/s       5XSTS 17 sec with UE use     Progressing   BERG - progressing      [facility] PT Assessment - 05/29/22 0001       Berg Balance Test   Sit to Stand Able to stand  independently using hands    Standing Unsupported Able to stand 2 minutes with supervision    Sitting with Back Unsupported but Feet Supported on Floor or Stool Able to sit safely and securely 2 minutes    Stand to Sit Controls descent by using hands    Transfers Able to transfer safely, definite need of hands    Standing Unsupported with Eyes Closed Able to stand 10 seconds with supervision    Standing Unsupported with Feet Together Able to place feet together independently and stand for 1 minute with supervision    From Standing, Reach Forward with Outstretched Arm Can reach forward >12 cm safely (5")    From Standing Position, Pick up Object from Floor Unable to pick up shoe, but reaches 2-5 cm (1-2") from shoe and balances independently    From Standing Position, Turn to Look Behind Over each Shoulder Turn sideways only but maintains balance    Turn 360 Degrees Able to turn 360 degrees safely but slowly    Standing Unsupported, Alternately Place Feet on Step/Stool Able to stand independently and complete 8 steps >20 seconds    Standing Unsupported, One Foot in Front Able to take small step independently and hold 30 seconds  Standing on One Leg Able to  lift leg independently and hold equal to or more than 3 seconds    Total Score 38              Treatment Provided this session   Pt educated throughout session about proper posture and technique with exercises. Improved exercise technique, movement at target joints, use of target muscles after min to mod verbal, visual, tactile cues. Note: Portions of this document were prepared using Dragon voice recognition software and although reviewed may contain unintentional dictation errors in syntax, grammar, or spelling.     PATIENT EDUCATION: Education details: Pt given verbal cuing on proper demonstration of technique during performance of exercises and as part of HEP. Person educated: Patient Education method: Explanation, Demonstration, Tactile cues, and Verbal cues Education comprehension: verbalized understanding, returned demonstration, and verbal cues required   HOME EXERCISE PROGRAM:  Access Code: [place] URL: [email or web address] Date: 04/26/2022 Prepared by: Thiells Nation  Exercises - Standing March with Counter Support  - 1 x daily - 7 x weekly - 3 sets - 10 reps - Standing Knee Flexion with Unilateral Counter Support  - 1 x daily - 7 x weekly - 3 sets - 10 reps - Heel Toe Raises with Unilateral Counter Support  - 1 x daily - 7 x weekly - 3 sets - 10 reps - Standing Hip Abduction with Unilateral Counter Support  - 1 x daily - 7 x weekly - 3 sets - 10 reps - Standing Hip Extension with Unilateral Counter Support  - 1 x daily - 7 x weekly - 3 sets - 10 reps   PT Short Term Goals        PT SHORT TERM GOAL #1   Title Patient will be independent in home exercise program to improve strength/mobility for better functional independence with ADLs.    Baseline no HEP 8/15: Not completing regularly, lots of discussion about importance of comliance    Time 4    Period Weeks    Status New    Target Date 04/16/22              PT Long Term Goals       PT  LONG TERM GOAL #1   Title Patient will increase FOTO score to equal to or greater than   48  to demonstrate statistically significant improvement in mobility and quality of life.    Baseline 42    Time 8    Period Weeks    Status Met    Target Date 06/11/22      PT LONG TERM GOAL #2   Title Patient  will complete five times sit to stand test in < 17 seconds indicating an increased LE strength and improved balance.    Baseline 23.53 sec 8/15: 17 sec heavy UE use    Time 12    Period Weeks    Status New    Target Date 06/11/22      PT LONG TERM GOAL #3   Title Patient will increase Berg Balance score by > 6 points to demonstrate decreased fall risk during functional activities.    Baseline 34,  8/15:38   Time 12    Period Weeks    Status New      PT LONG TERM GOAL #4   Title Patient will increase six minute walk test distance to >1000 for progression to community ambulator and improve gait ability    Baseline 725 feet  Time 12    Period Weeks    Status New    Target Date 06/11/22      PT LONG TERM GOAL #5   Title Patient will increase 10 meter walk test to >1.13ms as to improve gait speed for better community ambulation and to reduce fall risk.    Baseline .58 m/s   Time 12    Period Weeks    Status New    Target Date 06/11/22              Plan     Clinical Impression Statement  Patient visit physical therapy for progress note.  Patient assessed with ambulation although patient not community ambulation level patient is making good progress with his endurance.  Patient has made significant improvement with his BMerrilee Janskybalance score indicating decreased risk of falls patient still at significant fall risk based on the current Berg balance scores will continue to benefit from progressing balance interventions.  Patient also made significant improvement in his 5 times sit to stand score indicating improved lower extremity strength and power as well as decreased risk of falls.   Patient also improves with focus on therapeutic outcome survey although some of these may not be completely accurate means of measuring his progress based on patient's reported level of confidence are likely very difficult based on clinical presentation.Pt will continue to benefit from skilled physical therapy intervention to address impairments, improve QOL, and attain therapy goals.  Patient's condition has the potential to improve in response to therapy. Maximum improvement is yet to be obtained. The anticipated improvement is attainable and reasonable in a generally predictable time.          Personal Factors and Comorbidities Age;Comorbidity 1;Comorbidity 2;Comorbidity 3+    Comorbidities CAD, Peripheral neuropathy, T2DM, HTN,    Examination-Activity Limitations Caring for Others;Carry;Locomotion Level;Stairs;Stand    Examination-Participation Restrictions Yard Work;Shop;Community Activity    Stability/Clinical Decision Making Evolving/Moderate complexity    Rehab Potential Fair    PT Frequency 2x / week    PT Duration 12 weeks    PT Treatment/Interventions ADLs/Self Care Home Management;Moist Heat;DME Instruction;Stair training;Functional mobility training;Therapeutic activities;Therapeutic exercise;Balance training;Neuromuscular re-education;Patient/family education;Wheelchair mobility training;Manual techniques;Dry needling;Energy conservation    PT Next Visit Plan test 6MWT and 10MWT, also establish HEP for 2 weeks pt will be at bTensedProvided visit 2    Consulted and Agree with Plan of Care Patient;Family member/caregiver              CParticia LatherPT, DPT  05/29/22 1:15 PM   05/29/22, 1:15 PM

## 2022-05-30 ENCOUNTER — Ambulatory Visit (INDEPENDENT_AMBULATORY_CARE_PROVIDER_SITE_OTHER): Payer: PPO

## 2022-05-30 DIAGNOSIS — Z5181 Encounter for therapeutic drug level monitoring: Secondary | ICD-10-CM | POA: Diagnosis not present

## 2022-05-30 DIAGNOSIS — R251 Tremor, unspecified: Secondary | ICD-10-CM | POA: Diagnosis not present

## 2022-05-30 DIAGNOSIS — R413 Other amnesia: Secondary | ICD-10-CM | POA: Diagnosis not present

## 2022-05-30 DIAGNOSIS — I4891 Unspecified atrial fibrillation: Secondary | ICD-10-CM

## 2022-05-30 DIAGNOSIS — I482 Chronic atrial fibrillation, unspecified: Secondary | ICD-10-CM

## 2022-05-30 DIAGNOSIS — F17200 Nicotine dependence, unspecified, uncomplicated: Secondary | ICD-10-CM | POA: Diagnosis not present

## 2022-05-30 DIAGNOSIS — Z7901 Long term (current) use of anticoagulants: Secondary | ICD-10-CM | POA: Diagnosis not present

## 2022-05-30 DIAGNOSIS — I4892 Unspecified atrial flutter: Secondary | ICD-10-CM

## 2022-05-30 DIAGNOSIS — I4819 Other persistent atrial fibrillation: Secondary | ICD-10-CM | POA: Diagnosis not present

## 2022-05-30 DIAGNOSIS — I422 Other hypertrophic cardiomyopathy: Secondary | ICD-10-CM | POA: Diagnosis not present

## 2022-05-30 DIAGNOSIS — Z9889 Other specified postprocedural states: Secondary | ICD-10-CM | POA: Diagnosis not present

## 2022-05-30 DIAGNOSIS — E119 Type 2 diabetes mellitus without complications: Secondary | ICD-10-CM | POA: Diagnosis not present

## 2022-05-30 DIAGNOSIS — D692 Other nonthrombocytopenic purpura: Secondary | ICD-10-CM | POA: Diagnosis not present

## 2022-05-30 DIAGNOSIS — Z952 Presence of prosthetic heart valve: Secondary | ICD-10-CM | POA: Diagnosis not present

## 2022-05-30 DIAGNOSIS — Z8719 Personal history of other diseases of the digestive system: Secondary | ICD-10-CM | POA: Diagnosis not present

## 2022-05-30 LAB — POCT INR: INR: 3.9 — AB (ref 2.0–3.0)

## 2022-05-30 NOTE — Patient Instructions (Signed)
HOLD TONIGHT ONLY and then resume same dosage of Warfarin 1.5 tablets every day EXCEPT 1 tablet on [place].  Recheck INR in 4 weeks.

## 2022-05-31 ENCOUNTER — Ambulatory Visit: Payer: PPO | Admitting: Physical Therapy

## 2022-05-31 ENCOUNTER — Encounter: Payer: Self-pay | Admitting: Physical Therapy

## 2022-05-31 DIAGNOSIS — M6281 Muscle weakness (generalized): Secondary | ICD-10-CM

## 2022-05-31 DIAGNOSIS — R2681 Unsteadiness on feet: Secondary | ICD-10-CM

## 2022-05-31 DIAGNOSIS — R262 Difficulty in walking, not elsewhere classified: Secondary | ICD-10-CM

## 2022-05-31 DIAGNOSIS — R2689 Other abnormalities of gait and mobility: Secondary | ICD-10-CM

## 2022-05-31 NOTE — Therapy (Signed)
OUTPATIENT PHYSICAL THERAPY TREATMENT NOTE   Patient Name: [name] [name] MRN: [id number] DOB:1934/06/30, 88 y.o., male Today's Date: 05/31/2022  PCP: [name], MD REFERRING PROVIDER: [name], MD   PT End of Session - 05/31/22 1156     Visit Number 11    Number of Visits 24    Date for PT Re-Evaluation 06/11/22    Authorization Type [employer]    Authorization Time Period -06/11/22    Progress Note Due on Visit 10    PT Start Time 1151    PT Stop Time 1229    PT Time Calculation (min) 38 min    Equipment Utilized During Treatment Gait belt    Activity Tolerance Patient tolerated treatment well    Behavior During Therapy [facility] for tasks assessed/performed              Past Medical History:  Diagnosis Date   Aortic stenosis    a. 01/2016 echo: mod AS; b. 04/2018 Cardiac MRI: sev AS; c. 05/2018 s/p mech AVR @ time of myomectomy; d. 07/2018 Echo: Mild AS/AI.   Atherosclerosis of aorta ([place])    by CT scan in past   Atrial fibrillation (HCC)    and aflutter. pt has a left atrial circuit that is not ablated. was on amiodarone-stopped, now use rate control.    Bladder cancer ([place])    hx; treated with BCG in past    Carotid artery disease ([place])    There was calcified plaque but no stenosis by carotid artery screening done at [facility] October, 2013   CHB (complete heart block) [facility])    a. 05/2018 s/p BSX VVIR PPM ([facility]) in setting of bradycardia following myomectomy and AVR/MVR.   Diabetes mellitus    type not specified   Diabetic neuropathy ([place])    feet   Elevated liver enzymes    over time; hx   Excessive sweating    Glaucoma    Gout    Head injury    when slipped on ice 2004-2005. stabilized and back on Coumadin    Headache    migraines - distant past   History of cardiac cath    a. 05/2018 Cath ([facility]): Non-obstructive CAD. Mildly elevated filling pressures w/ nl CI.   HOCM (hypertrophic obstructive cardiomyopathy)  ([place])    a. 01/2016 Echo: EF 65-70%, no rwma, LVOT gradient of 80-57mHg, mod AS, SAM; b. 11/2017 Echo: EF 55-60%, near cavity obliteration in systole, mod AS, mild MS; c. 04/2018 Card MRI ([facility]): Sev LVH, EF >70%, Sev AS, LVOT obs w/ Sev MR, mild to mod TR/PR, mid-myocardial HE in basal-mid anteroseptum and inferoseptum; d. 05/2018 s/p Septal myomectomy; e. 07/2018 Echo: EF 60-65%. PASP 31mg.   [place]ocystinemia ([place]   elevated, mild    Hypercholesterolemia    treated.    Hypertension    Infection of right inner ear    Kidney mass    a. s/p laproscopic surgery woth cryoablation of a mass outside [facility]: 2cm R kidney mass.   Mediastinal adenopathy    a. 12/208 CT Chest: up to 76m71mLL lung nodule. 1.5-1.7cm subcarinal/mediastinal adenopahty/right paratracheal lymph node; b. 11/2018 CT Chest: 2.5-3cm mediastinal adenopathy.   Mitral regurgitation    a. 04/2018 Cardiac MRI: sev MR; b. 05/2018 s/p mech MVR @ time of myomectomy; c. 07/2018 Echo: Mild MS.   Motion sickness    ocean boats   Nausea  Nonobstructive coronary artery disease    a. 05/2018 Cath ([facility]): nonobs CAD.   Obstructive sleep apnea    CPAP started successfully 2014   Orthostatic hypotension    a. orthostatic. dehydration. hospitalized 11/11   Sleep apnea    Significant obstructive sleep apnea diagnosed in August, 2012, the patient is to receive CPAP    Stroke [facility])    "2 old strokes" CT and MRI. [place] [facility] 11/11. diagnosis was dehydration, no acutal reports.    TSH elevation    on synthroid historically    Unsteady gait    August, 2012   Past Surgical History:  Procedure Laterality Date   BLADDER SURGERY     CATARACT EXTRACTION W/PHACO Left 05/11/2015   Procedure: CATARACT EXTRACTION PHACO AND INTRAOCULAR LENS PLACEMENT ([place]);  Surgeon: Leandrew Koyanagi, MD;  Location: [place];  Service: Ophthalmology;  Laterality: Left;  DIABETIC - oral meds, CPAP   COLONOSCOPY  WITH PROPOFOL N/A 04/14/2019   Procedure: COLONOSCOPY WITH PROPOFOL;  Surgeon: [name], MD;  Location: [facility] ENDOSCOPY;  Service: Endoscopy;  Laterality: N/A;   ESOPHAGOGASTRODUODENOSCOPY (EGD) WITH PROPOFOL N/A 07/01/2018   Procedure: ESOPHAGOGASTRODUODENOSCOPY (EGD) WITH PROPOFOL;  Surgeon: [name], MD;  Location: [facility] ENDOSCOPY;  Service: Endoscopy;  Laterality: N/A;   INSERT / REPLACE / [place]     "froze mass"   MECHANICAL AORTIC AND MITRAL VALVE REPLACEMENT  05/2018   [facility]    TONSILLECTOMY     VALVE REPLACEMENT     Patient Active Problem List   Diagnosis Date Noted   Other abnormalities of gait and mobility 11/15/2021   Depression 11/15/2021   Encounter for fitting and adjustment of hearing aid 11/15/2021   Abnormal CT scan    Abnormal findings on radiological examination of gastrointestinal tract    Polyp of colon    Cancer of right kidney ([place]) 12/10/2018   Pulmonary nodules 12/01/2018   Mediastinal lymphadenopathy 12/01/2018   Heart block AV third degree ([place]) 10/07/2018   Pacemaker 10/07/2018   H/O aortic valve replacement 10/07/2018   Blood in stool    Bleeding duodenal ulcer    GIB (gastrointestinal bleeding) 06/30/2018   Acute upper GI bleed    Decreased activities of daily living (ADL) 06/09/2018   Decreased strength, endurance, and mobility 06/09/2018   Pleural effusion, right 06/09/2018   S/P AVR (aortic valve replacement) 06/08/2018   H/O ventricular septal myectomy 06/08/2018   Chronic anticoagulation 06/02/2018   Heart murmur, systolic 40/98/1191   H/O: rheumatic fever 03/21/2017   Tinnitus 10/30/2016   On warfarin therapy 07/06/2015   Arteriosclerosis of coronary artery 06/13/2015   Carcinoma in situ of bladder 06/13/2015   Diabetes mellitus, type 2 ([place]) 06/13/2015   Diabetic neuropathy ([place]) 06/13/2015   Dizziness 06/13/2015   Essential (primary) hypertension 06/13/2015   Fatigue 06/13/2015    Glaucoma 06/13/2015   Hypercholesteremia 06/13/2015   Male hypogonadism 06/13/2015   Adult hypothyroidism 06/13/2015   Arthritis, degenerative 06/13/2015   CAP (community acquired pneumonia) 06/13/2015   Adenocarcinoma, renal cell ([place]) 06/13/2015   Benign head tremor 12/13/2014   Carotid arterial disease ([place]) 07/12/2014   Decreased cardiac output 07/12/2014   Cardiomyopathy, hypertrophic obstructive ([place]) 07/12/2014   Head injuries 07/12/2014   HOCM (hypertrophic obstructive cardiomyopathy) ([place])    Encounter for therapeutic drug monitoring 11/16/2013   Obstructive sleep apnea    Benign prostatic hypertrophy without urinary obstruction 06/09/2012   History of neoplasm  of bladder 06/09/2012   Head injury    Atherosclerosis of aorta (HCC)    Hypotension    Stroke (HCC)    Unsteady gait    Excessive sweating    Nausea    ORTHOSTATIC HYPOTENSION, HX OF 09/01/2010   Overweight 03/07/2009   MITRAL REGURGITATION 03/05/2009   Chronic atrial fibrillation ([place]) 03/05/2009   Atrial fibrillation and flutter ([place]) 03/05/2009   Mitral and aortic incompetence 03/05/2009      REFERRING DIAG: Imbalance  THERAPY DIAG:  Difficulty in walking, not elsewhere classified  Other abnormalities of gait and mobility  Unsteadiness on feet  Muscle weakness (generalized)  Rationale for Evaluation and Treatment Rehabilitation  PERTINENT HISTORY:  Pt report she has been having balance issues secondary to his ears. Pt was in the artilerary. 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 Pt sold a home in april and has fallen 5 times since then. Pt tripped over a piece of wood in back yard and fell on one occassion. Pt has history of cardiac rehab here at [facility]. Pt has a very difficult time with balance without his cane. He has difficulty with balance when heturns his head.   PRECAUTIONS: Fall  SUBJECTIVE: Pt doing well. Denies any new falls or changes since  last visit.   Presents to therapy with his wife;  PAIN:  Are you having pain? No     TODAY'S TREATMENT:  SPO2% 93 % following balance course, increased to 96% with 2 min of seated rest   Exercise/Activity Sets/Reps/Time/ Resistance Assistance Charge type Comments  Nustep  Seat and UR level 10, level 1 x 4 min, level 2 x 2 min   TE Cues for SMP and assistance with machine resistance changes.   1 LE airex, 1 LE step  2 x 30 sec ea  CGA NMR Some UE assit required when off balance, improved tolerance with practice   STS with airex pad and no UE support  1 x 8 reps  1*5 reps  CGA, Min A for completion on a few reps  TA Good ant weight shift,  no UE use but some assistance form PT   Step taps anterior and lateral  *10 ea LE with 2.5# AW  CGA  TA Cues for no UE assist   Balance course: 1/2 foam, hurdle, airex, 1/2 foam, cones  X 2 laps with cane each direction  CGA ( [place] on first round)  NMR 1 LOB on cone weaving but able to self correct with stepping strategy   Mini squat with focus on full extension and glute activation at top.  X 15   TA Cues for feet width and full erect posture each rep   Airex stance  3 x 30 seconds  CGA  NMR  Cues for no UE assist, improved balance with reps                           Treatment Provided this session   Pt educated throughout session about proper posture and technique with exercises. Improved exercise technique, movement at target joints, use of target muscles after min to mod verbal, visual, tactile cues. Note: Portions of this document were prepared using Dragon voice recognition software and although reviewed may contain unintentional dictation errors in syntax, grammar, or spelling.     PATIENT EDUCATION: Education details: Pt given verbal cuing on proper demonstration of technique during  performance of exercises and as part of HEP. Person educated: Patient Education method: Explanation, Demonstration, Tactile cues, and Verbal cues Education  comprehension: verbalized understanding, returned demonstration, and verbal cues required   HOME EXERCISE PROGRAM:  Access Code: [place] URL: [email or web address] Date: 04/26/2022 Prepared by: [place]  Exercises - Standing March with Counter Support  - 1 x daily - 7 x weekly - 3 sets - 10 reps - Standing Knee Flexion with Unilateral Counter Support  - 1 x daily - 7 x weekly - 3 sets - 10 reps - Heel Toe Raises with Unilateral Counter Support  - 1 x daily - 7 x weekly - 3 sets - 10 reps - Standing Hip Abduction with Unilateral Counter Support  - 1 x daily - 7 x weekly - 3 sets - 10 reps - Standing Hip Extension with Unilateral Counter Support  - 1 x daily - 7 x weekly - 3 sets - 10 reps   PT Short Term Goals        PT SHORT TERM GOAL #1   Title Patient will be independent in home exercise program to improve strength/mobility for better functional independence with ADLs.    Baseline no HEP    Time 4    Period Weeks    Status New    Target Date 04/16/22              PT Long Term Goals       PT LONG TERM GOAL #1   Title Patient will increase FOTO score to equal to or greater than   48  to demonstrate statistically significant improvement in mobility and quality of life.    Baseline 42    Time 8    Period Weeks    Status New    Target Date 06/11/22      PT LONG TERM GOAL #2   Title Patient  will complete five times sit to stand test in < 17 seconds indicating an increased LE strength and improved balance.    Baseline 23.53 sec    Time 12    Period Weeks    Status New    Target Date 06/11/22      PT LONG TERM GOAL #3   Title Patient will increase Berg Balance score by > 6 points to demonstrate decreased fall risk during functional activities.    Baseline 34    Time 12    Period Weeks    Status New      PT LONG TERM GOAL #4   Title Patient will increase six minute walk test distance to >1000 for progression to community ambulator and improve  gait ability    Baseline test session 2    Time 12    Period Weeks    Status New    Target Date 06/11/22      PT LONG TERM GOAL #5   Title Patient will increase 10 meter walk test to >1.16ms as to improve gait speed for better community ambulation and to reduce fall risk.    Baseline to test session 2    Time 12    Period Weeks    Status New    Target Date 06/11/22              Plan     Clinical Impression Statement Continued with current plan of care as laid out in evaluation and recent prior sessions. Pt remains motivated to advance progress toward goals in order to maximize independence and  safety at home. Pt requires high level assistance and cuing for completion of exercises in order to provide adequate level of stimulation and perturbation. Author allows pt as much opportunity as possible to perform independent righting strategies, only stepping in when pt is unable to prevent falling to floor. Pt closely monitored throughout session for safe vitals response and to maximize patient safety during interventions.  Patient did not have any instances of lightheadedness or dizziness this session. Pt still has significant difficulty with STS indicating LE weakness. Will continue to address in future sessions and with HEP. Pt continues to demonstrate progress toward goals AEB progression of some interventions this date either in volume or intensity.       Personal Factors and Comorbidities Age;Comorbidity 1;Comorbidity 2;Comorbidity 3+    Comorbidities CAD, Peripheral neuropathy, T2DM, HTN,    Examination-Activity Limitations Caring for Others;Carry;Locomotion Level;Stairs;Stand    Examination-Participation Restrictions Yard Work;Shop;Community Activity    Stability/Clinical Decision Making Evolving/Moderate complexity    Rehab Potential Fair    PT Frequency 2x / week    PT Duration 12 weeks    PT Treatment/Interventions ADLs/Self Care Home Management;Moist Heat;DME  Instruction;Stair training;Functional mobility training;Therapeutic activities;Therapeutic exercise;Balance training;Neuromuscular re-education;Patient/family education;Wheelchair mobility training;Manual techniques;Dry needling;Energy conservation    PT Next Visit Plan test 6MWT and 10MWT, also establish HEP for 2 weeks pt will be at [place] Provided visit 2    Consulted and Agree with Plan of Care Patient;Family member/caregiver              Particia Lather PT, DPT  05/31/22 12:31 PM   05/31/22, 12:31 PM        Pt educated throughout session about proper posture and technique with exercises. Improved exercise technique, movement at target joints, use of target muscles after min to mod verbal, visual, tactile cues. Note: Portions of this document were prepared using Dragon voice recognition software and although reviewed may contain unintentional dictation errors in syntax, grammar, or spelling.     PATIENT EDUCATION: Education details: Pt given verbal cuing on proper demonstration of technique during performance of exercises and as part of HEP. Person educated: Patient Education method: Explanation, Demonstration, Tactile cues, and Verbal cues Education comprehension: verbalized understanding, returned demonstration, and verbal cues required   HOME EXERCISE PROGRAM:  Access Code: [place] URL: [email or web address] Date: 04/26/2022 Prepared by: [place]  Exercises - Standing March with Counter Support  - 1 x daily - 7 x weekly - 3 sets - 10 reps - Standing Knee Flexion with Unilateral Counter Support  - 1 x daily - 7 x weekly - 3 sets - 10 reps - Heel Toe Raises with Unilateral Counter Support  - 1 x daily - 7 x weekly - 3 sets - 10 reps - Standing Hip Abduction with Unilateral Counter Support  - 1 x daily - 7 x weekly - 3 sets - 10 reps - Standing Hip Extension with Unilateral Counter Support  - 1 x daily - 7 x weekly -  3 sets - 10 reps   PT Short Term Goals        PT SHORT TERM GOAL #1   Title Patient will be independent in home exercise program to improve strength/mobility for better functional independence with ADLs.    Baseline no HEP 8/15: Not completing regularly, lots of discussion about importance of comliance    Time 4    Period Weeks    Status New    Target Date  04/16/22              PT Long Term Goals       PT LONG TERM GOAL #1   Title Patient will increase FOTO score to equal to or greater than   48  to demonstrate statistically significant improvement in mobility and quality of life.    Baseline 42    Time 8    Period Weeks    Status Met    Target Date 06/11/22      PT LONG TERM GOAL #2   Title Patient  will complete five times sit to stand test in < 17 seconds indicating an increased LE strength and improved balance.    Baseline 23.53 sec 8/15: 17 sec heavy UE use    Time 12    Period Weeks    Status New    Target Date 06/11/22      PT LONG TERM GOAL #3   Title Patient will increase Berg Balance score by > 6 points to demonstrate decreased fall risk during functional activities.    Baseline 34,  8/15:38   Time 12    Period Weeks    Status New      PT LONG TERM GOAL #4   Title Patient will increase six minute walk test distance to >1000 for progression to community ambulator and improve gait ability    Baseline 725 feet    Time 12    Period Weeks    Status New    Target Date 06/11/22      PT LONG TERM GOAL #5   Title Patient will increase 10 meter walk test to >1.62ms as to improve gait speed for better community ambulation and to reduce fall risk.    Baseline .58 m/s   Time 12    Period Weeks    Status New    Target Date 06/11/22              Plan     Clinical Impression Statement  Patient visit physical therapy for progress note.  Patient assessed with ambulation although patient not community ambulation level patient is making good progress with  his endurance.  Patient has made significant improvement with his BMerrilee Janskybalance score indicating decreased risk of falls patient still at significant fall risk based on the current Berg balance scores will continue to benefit from progressing balance interventions.  Patient also made significant improvement in his 5 times sit to stand score indicating improved lower extremity strength and power as well as decreased risk of falls.  Patient also improves with focus on therapeutic outcome survey although some of these may not be completely accurate means of measuring his progress based on patient's reported level of confidence are likely very difficult based on clinical presentation.Pt will continue to benefit from skilled physical therapy intervention to address impairments, improve QOL, and attain therapy goals.  Patient's condition has the potential to improve in response to therapy. Maximum improvement is yet to be obtained. The anticipated improvement is attainable and reasonable in a generally predictable time.          Personal Factors and Comorbidities Age;Comorbidity 1;Comorbidity 2;Comorbidity 3+    Comorbidities CAD, Peripheral neuropathy, T2DM, HTN,    Examination-Activity Limitations Caring for Others;Carry;Locomotion Level;Stairs;Stand    Examination-Participation Restrictions Yard Work;Shop;Community Activity    Stability/Clinical Decision Making Evolving/Moderate complexity    Rehab Potential Fair    PT Frequency 2x / week    PT Duration 12 weeks  PT Treatment/Interventions ADLs/Self Care Home Management;Moist Heat;DME Instruction;Stair training;Functional mobility training;Therapeutic activities;Therapeutic exercise;Balance training;Neuromuscular re-education;Patient/family education;Wheelchair mobility training;Manual techniques;Dry needling;Energy conservation    PT Next Visit Plan test 6MWT and 10MWT, also establish HEP for 2 weeks pt will be at [place]  Provided visit 2    Consulted and Agree with Plan of Care Patient;Family member/caregiver              Particia Lather PT, DPT  05/31/22 12:31 PM   05/31/22, 12:31 PM

## 2022-06-05 ENCOUNTER — Encounter: Payer: Self-pay | Admitting: Physical Therapy

## 2022-06-05 ENCOUNTER — Ambulatory Visit: Payer: PPO | Admitting: Physical Therapy

## 2022-06-05 DIAGNOSIS — R2689 Other abnormalities of gait and mobility: Secondary | ICD-10-CM

## 2022-06-05 DIAGNOSIS — R262 Difficulty in walking, not elsewhere classified: Secondary | ICD-10-CM | POA: Diagnosis not present

## 2022-06-05 DIAGNOSIS — R2681 Unsteadiness on feet: Secondary | ICD-10-CM

## 2022-06-05 DIAGNOSIS — M6281 Muscle weakness (generalized): Secondary | ICD-10-CM

## 2022-06-05 NOTE — Therapy (Signed)
OUTPATIENT PHYSICAL THERAPY TREATMENT NOTE   Patient Name: [name] [name] MRN: [id number] DOB:05-Jan-1934, 88 y.o., male Today's Date: 06/05/2022  PCP: [name], MD REFERRING PROVIDER: [name], MD   PT End of Session - 06/05/22 1158     Visit Number 12    Number of Visits 24    Date for PT Re-Evaluation 06/11/22    Authorization Type [employer]    Authorization Time Period -06/11/22    Progress Note Due on Visit 20    PT Start Time 1155    PT Stop Time 1229    PT Time Calculation (min) 34 min    Equipment Utilized During Treatment Gait belt    Activity Tolerance Patient tolerated treatment well    Behavior During Therapy [facility] for tasks assessed/performed               Past Medical History:  Diagnosis Date   Aortic stenosis    a. 01/2016 echo: mod AS; b. 04/2018 Cardiac MRI: sev AS; c. 05/2018 s/p mech AVR @ time of myomectomy; d. 07/2018 Echo: Mild AS/AI.   Atherosclerosis of aorta ([place])    by CT scan in past   Atrial fibrillation (HCC)    and aflutter. pt has a left atrial circuit that is not ablated. was on amiodarone-stopped, now use rate control.    Bladder cancer ([place])    hx; treated with BCG in past    Carotid artery disease ([place])    There was calcified plaque but no stenosis by carotid artery screening done at [facility] October, 2013   CHB (complete heart block) [facility])    a. 05/2018 s/p BSX VVIR PPM ([facility]) in setting of bradycardia following myomectomy and AVR/MVR.   Diabetes mellitus    type not specified   Diabetic neuropathy ([place])    feet   Elevated liver enzymes    over time; hx   Excessive sweating    Glaucoma    Gout    Head injury    when slipped on ice 2004-2005. stabilized and back on Coumadin    Headache    migraines - distant past   History of cardiac cath    a. 05/2018 Cath ([facility]): Non-obstructive CAD. Mildly elevated filling pressures w/ nl CI.   HOCM (hypertrophic obstructive  cardiomyopathy) ([place])    a. 01/2016 Echo: EF 65-70%, no rwma, LVOT gradient of 80-28mHg, mod AS, SAM; b. 11/2017 Echo: EF 55-60%, near cavity obliteration in systole, mod AS, mild MS; c. 04/2018 Card MRI ([facility]): Sev LVH, EF >70%, Sev AS, LVOT obs w/ Sev MR, mild to mod TR/PR, mid-myocardial HE in basal-mid anteroseptum and inferoseptum; d. 05/2018 s/p Septal myomectomy; e. 07/2018 Echo: EF 60-65%. PASP 34mg.   [place]   elevated, mild    Hypercholesterolemia    treated.    Hypertension    Infection of right inner ear    Kidney mass    a. s/p laproscopic surgery woth cryoablation of a mass outside [facility]: 2cm R kidney mass.   Mediastinal adenopathy    a. 12/208 CT Chest: up to 67m68mLL lung nodule. 1.5-1.7cm subcarinal/mediastinal adenopahty/right paratracheal lymph node; b. 11/2018 CT Chest: 2.5-3cm mediastinal adenopathy.   Mitral regurgitation    a. 04/2018 Cardiac MRI: sev MR; b. 05/2018 s/p mech MVR @ time of myomectomy; c. 07/2018 Echo: Mild MS.   Motion sickness    ocean boats   Nausea  Nonobstructive coronary artery disease    a. 05/2018 Cath ([facility]): nonobs CAD.   Obstructive sleep apnea    CPAP started successfully 2014   Orthostatic hypotension    a. orthostatic. dehydration. hospitalized 11/11   Sleep apnea    Significant obstructive sleep apnea diagnosed in August, 2012, the patient is to receive CPAP    Stroke [facility])    "2 old strokes" CT and MRI. [place] hospital 11/11. diagnosis was dehydration, no acutal reports.    TSH elevation    on synthroid historically    Unsteady gait    August, 2012   Past Surgical History:  Procedure Laterality Date   BLADDER SURGERY     CATARACT EXTRACTION W/PHACO Left 05/11/2015   Procedure: CATARACT EXTRACTION PHACO AND INTRAOCULAR LENS PLACEMENT ([place]);  Surgeon: Leandrew Koyanagi, MD;  Location: [place];  Service: Ophthalmology;  Laterality: Left;  DIABETIC - oral meds, CPAP    COLONOSCOPY WITH PROPOFOL N/A 04/14/2019   Procedure: COLONOSCOPY WITH PROPOFOL;  Surgeon: [name], MD;  Location: [facility] ENDOSCOPY;  Service: Endoscopy;  Laterality: N/A;   ESOPHAGOGASTRODUODENOSCOPY (EGD) WITH PROPOFOL N/A 07/01/2018   Procedure: ESOPHAGOGASTRODUODENOSCOPY (EGD) WITH PROPOFOL;  Surgeon: [name], MD;  Location: [facility] ENDOSCOPY;  Service: Endoscopy;  Laterality: N/A;   INSERT / REPLACE / [place]     "froze mass"   MECHANICAL AORTIC AND MITRAL VALVE REPLACEMENT  05/2018   [facility]    TONSILLECTOMY     VALVE REPLACEMENT     Patient Active Problem List   Diagnosis Date Noted   Other abnormalities of gait and mobility 11/15/2021   Depression 11/15/2021   Encounter for fitting and adjustment of hearing aid 11/15/2021   Abnormal CT scan    Abnormal findings on radiological examination of gastrointestinal tract    Polyp of colon    Cancer of right kidney ([place]) 12/10/2018   Pulmonary nodules 12/01/2018   Mediastinal lymphadenopathy 12/01/2018   Heart block AV third degree ([place]) 10/07/2018   Pacemaker 10/07/2018   H/O aortic valve replacement 10/07/2018   Blood in stool    Bleeding duodenal ulcer    GIB (gastrointestinal bleeding) 06/30/2018   Acute upper GI bleed    Decreased activities of daily living (ADL) 06/09/2018   Decreased strength, endurance, and mobility 06/09/2018   Pleural effusion, right 06/09/2018   S/P AVR (aortic valve replacement) 06/08/2018   H/O ventricular septal myectomy 06/08/2018   Chronic anticoagulation 06/02/2018   Heart murmur, systolic 88/87/5797   H/O: rheumatic fever 03/21/2017   Tinnitus 10/30/2016   On warfarin therapy 07/06/2015   Arteriosclerosis of coronary artery 06/13/2015   Carcinoma in situ of bladder 06/13/2015   Diabetes mellitus, type 2 ([place]) 06/13/2015   Diabetic neuropathy ([place]) 06/13/2015   Dizziness 06/13/2015   Essential (primary) hypertension 06/13/2015   Fatigue  06/13/2015   Glaucoma 06/13/2015   Hypercholesteremia 06/13/2015   Male hypogonadism 06/13/2015   Adult hypothyroidism 06/13/2015   Arthritis, degenerative 06/13/2015   CAP (community acquired pneumonia) 06/13/2015   Adenocarcinoma, renal cell ([place]) 06/13/2015   Benign head tremor 12/13/2014   Carotid arterial disease ([place]) 07/12/2014   Decreased cardiac output 07/12/2014   Cardiomyopathy, hypertrophic obstructive ([place]) 07/12/2014   Head injuries 07/12/2014   HOCM (hypertrophic obstructive cardiomyopathy) ([place])    Encounter for therapeutic drug monitoring 11/16/2013   Obstructive sleep apnea    Benign prostatic hypertrophy without urinary obstruction 06/09/2012   History of neoplasm  of bladder 06/09/2012   Head injury    Atherosclerosis of aorta (HCC)    Hypotension    Stroke (HCC)    Unsteady gait    Excessive sweating    Nausea    ORTHOSTATIC HYPOTENSION, HX OF 09/01/2010   Overweight 03/07/2009   MITRAL REGURGITATION 03/05/2009   Chronic atrial fibrillation ([place]) 03/05/2009   Atrial fibrillation and flutter ([place]) 03/05/2009   Mitral and aortic incompetence 03/05/2009      REFERRING DIAG: Imbalance  THERAPY DIAG:  Difficulty in walking, not elsewhere classified  Other abnormalities of gait and mobility  Unsteadiness on feet  Muscle weakness (generalized)  Rationale for Evaluation and Treatment Rehabilitation  PERTINENT HISTORY:  Pt report she has been having balance issues secondary to his ears. Pt was in the artilerary. 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 Pt sold a home in april and has fallen 5 times since then. Pt tripped over a piece of wood in back yard and fell on one occassion. Pt has history of cardiac rehab [facility] Pt has a very difficult time with balance without his cane. He has difficulty with balance when heturns his head.   PRECAUTIONS: Fall  SUBJECTIVE: Pt doing well. Denies any new falls or  changes since last visit.   Presents to therapy with his wife;  PAIN:  Are you having pain? No     TODAY'S TREATMENT:  SPO2% 93 % following balance course, increased to 96% with 2 min of seated rest   Exercise/Activity Sets/Reps/Time/ Resistance Assistance Charge type Comments  Mini squats   Ambulation with SPC  2 x 10   2*160 feet  CGA TE Mini squat with focus on full extension and glute activation at top.     STS with airex pad and no UE support  2*8 reps with last 4 of each set with hands on knees compared to hands on chair arms  CGA, TA Good ant weight shift,  no need for assistance from PT.   Step taps anterior and lateral  *10 ea LE with 2.5# AW  CGA  TA Cues for no UE assist   Balance course: 1/2 foam, hurdle, airex, 1/2 foam, cones  X 2 laps with cane each direction  CGA ( [place] on first round)  NMR 1 LOB on cone weaving but able to self correct with stepping strategy   Airex stance  3 x 30 seconds  CGA  NMR  Cues for no UE assist, improved balance with reps                     Treatment Provided this session   Pt educated throughout session about proper posture and technique with exercises. Improved exercise technique, movement at target joints, use of target muscles after min to mod verbal, visual, tactile cues. Note: Portions of this document were prepared using Dragon voice recognition software and although reviewed may contain unintentional dictation errors in syntax, grammar, or spelling.     PATIENT EDUCATION: Education details: Pt given verbal cuing on proper demonstration of technique during performance of exercises and as part of HEP. Person educated: Patient Education method: Explanation, Demonstration, Tactile cues, and Verbal cues Education comprehension: verbalized understanding, returned demonstration, and verbal cues required   HOME EXERCISE PROGRAM:  Access Code: [place] URL: [email or web address] Date: 04/26/2022 Prepared by: Stanley Nation  Exercises - Standing March with Counter Support  - 1 x daily -  7 x weekly - 3 sets - 10 reps - Standing Knee Flexion with Unilateral Counter Support  - 1 x daily - 7 x weekly - 3 sets - 10 reps - Heel Toe Raises with Unilateral Counter Support  - 1 x daily - 7 x weekly - 3 sets - 10 reps - Standing Hip Abduction with Unilateral Counter Support  - 1 x daily - 7 x weekly - 3 sets - 10 reps - Standing Hip Extension with Unilateral Counter Support  - 1 x daily - 7 x weekly - 3 sets - 10 reps   PT Short Term Goals        PT SHORT TERM GOAL #1   Title Patient will be independent in home exercise program to improve strength/mobility for better functional independence with ADLs.    Baseline no HEP    Time 4    Period Weeks    Status New    Target Date 04/16/22              PT Long Term Goals       PT LONG TERM GOAL #1   Title Patient will increase FOTO score to equal to or greater than   48  to demonstrate statistically significant improvement in mobility and quality of life.    Baseline 42    Time 8    Period Weeks    Status New    Target Date 06/11/22      PT LONG TERM GOAL #2   Title Patient  will complete five times sit to stand test in < 17 seconds indicating an increased LE strength and improved balance.    Baseline 23.53 sec    Time 12    Period Weeks    Status New    Target Date 06/11/22      PT LONG TERM GOAL #3   Title Patient will increase Berg Balance score by > 6 points to demonstrate decreased fall risk during functional activities.    Baseline 34    Time 12    Period Weeks    Status New      PT LONG TERM GOAL #4   Title Patient will increase six minute walk test distance to >1000 for progression to community ambulator and improve gait ability    Baseline test session 2    Time 12    Period Weeks    Status New    Target Date 06/11/22      PT LONG TERM GOAL #5   Title Patient will increase 10 meter walk test to >1.1ms as to improve gait  speed for better community ambulation and to reduce fall risk.    Baseline to test session 2    Time 12    Period Weeks    Status New    Target Date 06/11/22              Plan     Clinical Impression Statement Continued with current plan of care as laid out in evaluation and recent prior sessions. Pt remains motivated to advance progress toward goals in order to maximize independence and safety at home. Pt requires high level assistance and cuing for completion of exercises in order to provide adequate level of stimulation and perturbation. Author allows pt as much opportunity as possible to perform independent righting strategies, only stepping in when pt is unable to prevent falling to floor. Pt closely monitored throughout session for safe vitals response and to maximize patient  safety during interventions. Pt continues to demonstrate progress toward goals AEB progression of some interventions this date either in volume or intensity.        Personal Factors and Comorbidities Age;Comorbidity 1;Comorbidity 2;Comorbidity 3+    Comorbidities CAD, Peripheral neuropathy, T2DM, HTN,    Examination-Activity Limitations Caring for Others;Carry;Locomotion Level;Stairs;Stand    Examination-Participation Restrictions Yard Work;Shop;Community Activity    Stability/Clinical Decision Making Evolving/Moderate complexity    Rehab Potential Fair    PT Frequency 2x / week    PT Duration 12 weeks    PT Treatment/Interventions ADLs/Self Care Home Management;Moist Heat;DME Instruction;Stair training;Functional mobility training;Therapeutic activities;Therapeutic exercise;Balance training;Neuromuscular re-education;Patient/family education;Wheelchair mobility training;Manual techniques;Dry needling;Energy conservation    PT Next Visit Plan test 6MWT and 10MWT, also establish HEP for 2 weeks pt will be at [place] Provided visit 2    Consulted and Agree with Plan of Care  Patient;Family member/caregiver              Particia Lather PT, DPT  06/05/22 2:35 PM   06/05/22, 2:35 PM        Pt educated throughout session about proper posture and technique with exercises. Improved exercise technique, movement at target joints, use of target muscles after min to mod verbal, visual, tactile cues. Note: Portions of this document were prepared using Dragon voice recognition software and although reviewed may contain unintentional dictation errors in syntax, grammar, or spelling.     PATIENT EDUCATION: Education details: Pt given verbal cuing on proper demonstration of technique during performance of exercises and as part of HEP. Person educated: Patient Education method: Explanation, Demonstration, Tactile cues, and Verbal cues Education comprehension: verbalized understanding, returned demonstration, and verbal cues required   HOME EXERCISE PROGRAM:  Access Code: [place] URL: [email or web address] Date: 04/26/2022 Prepared by: [place] Nation  Exercises - Standing March with Counter Support  - 1 x daily - 7 x weekly - 3 sets - 10 reps - Standing Knee Flexion with Unilateral Counter Support  - 1 x daily - 7 x weekly - 3 sets - 10 reps - Heel Toe Raises with Unilateral Counter Support  - 1 x daily - 7 x weekly - 3 sets - 10 reps - Standing Hip Abduction with Unilateral Counter Support  - 1 x daily - 7 x weekly - 3 sets - 10 reps - Standing Hip Extension with Unilateral Counter Support  - 1 x daily - 7 x weekly - 3 sets - 10 reps   PT Short Term Goals        PT SHORT TERM GOAL #1   Title Patient will be independent in home exercise program to improve strength/mobility for better functional independence with ADLs.    Baseline no HEP 8/15: Not completing regularly, lots of discussion about importance of comliance    Time 4    Period Weeks    Status New    Target Date 04/16/22              PT Long Term Goals       PT  LONG TERM GOAL #1   Title Patient will increase FOTO score to equal to or greater than   48  to demonstrate statistically significant improvement in mobility and quality of life.    Baseline 42    Time 8    Period Weeks    Status Met    Target Date 06/11/22      PT LONG TERM GOAL #2  Title Patient  will complete five times sit to stand test in < 17 seconds indicating an increased LE strength and improved balance.    Baseline 23.53 sec 8/15: 17 sec heavy UE use    Time 12    Period Weeks    Status New    Target Date 06/11/22      PT LONG TERM GOAL #3   Title Patient will increase Berg Balance score by > 6 points to demonstrate decreased fall risk during functional activities.    Baseline 34,  8/15:38   Time 12    Period Weeks    Status New      PT LONG TERM GOAL #4   Title Patient will increase six minute walk test distance to >1000 for progression to community ambulator and improve gait ability    Baseline 725 feet    Time 12    Period Weeks    Status New    Target Date 06/11/22      PT LONG TERM GOAL #5   Title Patient will increase 10 meter walk test to >1.51ms as to improve gait speed for better community ambulation and to reduce fall risk.    Baseline .58 m/s   Time 12    Period Weeks    Status New    Target Date 06/11/22              Plan     Clinical Impression Statement  Patient visit physical therapy for progress note.  Patient assessed with ambulation although patient not community ambulation level patient is making good progress with his endurance.  Patient has made significant improvement with his BMerrilee Janskybalance score indicating decreased risk of falls patient still at significant fall risk based on the current Berg balance scores will continue to benefit from progressing balance interventions.  Patient also made significant improvement in his 5 times sit to stand score indicating improved lower extremity strength and power as well as decreased risk of falls.   Patient also improves with focus on therapeutic outcome survey although some of these may not be completely accurate means of measuring his progress based on patient's reported level of confidence are likely very difficult based on clinical presentation.Pt will continue to benefit from skilled physical therapy intervention to address impairments, improve QOL, and attain therapy goals.  Patient's condition has the potential to improve in response to therapy. Maximum improvement is yet to be obtained. The anticipated improvement is attainable and reasonable in a generally predictable time.          Personal Factors and Comorbidities Age;Comorbidity 1;Comorbidity 2;Comorbidity 3+    Comorbidities CAD, Peripheral neuropathy, T2DM, HTN,    Examination-Activity Limitations Caring for Others;Carry;Locomotion Level;Stairs;Stand    Examination-Participation Restrictions Yard Work;Shop;Community Activity    Stability/Clinical Decision Making Evolving/Moderate complexity    Rehab Potential Fair    PT Frequency 2x / week    PT Duration 12 weeks    PT Treatment/Interventions ADLs/Self Care Home Management;Moist Heat;DME Instruction;Stair training;Functional mobility training;Therapeutic activities;Therapeutic exercise;Balance training;Neuromuscular re-education;Patient/family education;Wheelchair mobility training;Manual techniques;Dry needling;Energy conservation    PT Next Visit Plan test 6MWT and 10MWT, also establish HEP for 2 [place]    Consulted and Agree with Plan of Care Patient;Family member/caregiver              CParticia LatherPT, DPT  06/05/22 2:35 PM   06/05/22, 2:35 PM

## 2022-06-07 ENCOUNTER — Ambulatory Visit: Payer: PPO | Admitting: Physical Therapy

## 2022-06-07 ENCOUNTER — Encounter: Payer: Self-pay | Admitting: Physical Therapy

## 2022-06-07 DIAGNOSIS — M6281 Muscle weakness (generalized): Secondary | ICD-10-CM

## 2022-06-07 DIAGNOSIS — R2681 Unsteadiness on feet: Secondary | ICD-10-CM

## 2022-06-07 DIAGNOSIS — R2689 Other abnormalities of gait and mobility: Secondary | ICD-10-CM

## 2022-06-07 DIAGNOSIS — R262 Difficulty in walking, not elsewhere classified: Secondary | ICD-10-CM

## 2022-06-07 NOTE — Therapy (Signed)
OUTPATIENT PHYSICAL THERAPY TREATMENT NOTE   Patient Name: [name] [name] MRN: [id number] DOB:01-09-1934, 88 y.o., male Today's Date: 06/07/2022  PCP: [name], MD REFERRING PROVIDER: [name], MD   PT End of Session - 06/07/22 1150     Visit Number 13    Number of Visits 24    Date for PT Re-Evaluation 06/11/22    Authorization Type [employer]    Authorization Time Period -06/11/22    Progress Note Due on Visit 20    PT Start Time 1147    PT Stop Time 1228    PT Time Calculation (min) 41 min    Equipment Utilized During Treatment Gait belt    Activity Tolerance Patient tolerated treatment well    Behavior During Therapy [facility] for tasks assessed/performed                Past Medical History:  Diagnosis Date   Aortic stenosis    a. 01/2016 echo: mod AS; b. 04/2018 Cardiac MRI: sev AS; c. 05/2018 s/p mech AVR @ time of myomectomy; d. 07/2018 Echo: Mild AS/AI.   Atherosclerosis of aorta ([place])    by CT scan in past   Atrial fibrillation (HCC)    and aflutter. pt has a left atrial circuit that is not ablated. was on amiodarone-stopped, now use rate control.    Bladder cancer ([place])    hx; treated with BCG in past    Carotid artery disease ([place])    There was calcified plaque but no stenosis by carotid artery screening done at [facility] October, 2013   CHB (complete heart block) [facility])    a. 05/2018 s/p BSX VVIR PPM ([facility]) in setting of bradycardia following myomectomy and AVR/MVR.   Diabetes mellitus    type not specified   Diabetic neuropathy ([place])    feet   Elevated liver enzymes    over time; hx   Excessive sweating    Glaucoma    Gout    Head injury    when slipped on ice 2004-2005. stabilized and back on Coumadin    Headache    migraines - distant past   History of cardiac cath    a. 05/2018 Cath ([facility]): Non-obstructive CAD. Mildly elevated filling pressures w/ nl CI.   HOCM (hypertrophic obstructive  cardiomyopathy) ([place])    a. 01/2016 Echo: EF 65-70%, no rwma, LVOT gradient of 80-79mHg, mod AS, SAM; b. 11/2017 Echo: EF 55-60%, near cavity obliteration in systole, mod AS, mild MS; c. 04/2018 Card MRI ([facility]): Sev LVH, EF >70%, Sev AS, LVOT obs w/ Sev MR, mild to mod TR/PR, mid-myocardial HE in basal-mid anteroseptum and inferoseptum; d. 05/2018 s/p Septal myomectomy; e. 07/2018 Echo: EF 60-65%. PASP 349mg.   Homocystinemia ([place]   elevated, mild    Hypercholesterolemia    treated.    Hypertension    Infection of right inner ear    Kidney mass    a. s/p laproscopic surgery woth cryoablation of a mass outside [facility] Chest: 2cm R kidney mass.   Mediastinal adenopathy    a. 12/208 CT Chest: up to 73m15mLL lung nodule. 1.5-1.7cm subcarinal/mediastinal adenopahty/right paratracheal lymph node; b. 11/2018 CT Chest: 2.5-3cm mediastinal adenopathy.   Mitral regurgitation    a. 04/2018 Cardiac MRI: sev MR; b. 05/2018 s/p mech MVR @ time of myomectomy; c. 07/2018 Echo: Mild MS.   Motion sickness    ocean boats   Nausea  Nonobstructive coronary artery disease    a. 05/2018 Cath ([facility]): nonobs CAD.   Obstructive sleep apnea    CPAP started successfully 2014   Orthostatic hypotension    a. orthostatic. dehydration. hospitalized 11/11   Sleep apnea    Significant obstructive sleep apnea diagnosed in August, 2012, the patient is to receive CPAP    Stroke [facility])    "2 old strokes" CT and MRI. [facility] 11/11. diagnosis was dehydration, no acutal reports.    TSH elevation    on synthroid historically    Unsteady gait    August, 2012   Past Surgical History:  Procedure Laterality Date   BLADDER SURGERY     CATARACT EXTRACTION W/PHACO Left 05/11/2015   Procedure: CATARACT EXTRACTION PHACO AND INTRAOCULAR LENS PLACEMENT ([place]);  Surgeon: Leandrew Koyanagi, MD;  Location: [place];  Service: Ophthalmology;  Laterality: Left;  DIABETIC - oral meds, CPAP    COLONOSCOPY WITH PROPOFOL N/A 04/14/2019   Procedure: COLONOSCOPY WITH PROPOFOL;  Surgeon: [name], MD;  [facility] END[facility]COPY;  Service: Endoscopy;  Laterality: N/A;   ESOPHAGOGASTRODUODENOSCOPY (EGD) WITH PROPOFOL N/A 07/01/2018   Procedure: ESOPHAGOGASTRODUODENOSCOPY (EGD) WITH PROPOFOL;  Surgeon: [name], MD;  Location: [facility] ENDOSCOPY;  Service: Endoscopy;  Laterality: N/A;   INSERT / REPLACE / [place]     "froze mass"   MECHANICAL AORTIC AND MITRAL VALVE REPLACEMENT  05/2018   [facility]    TONSILLECTOMY     VALVE REPLACEMENT     Patient Active Problem List   Diagnosis Date Noted   Other abnormalities of gait and mobility 11/15/2021   Depression 11/15/2021   Encounter for fitting and adjustment of hearing aid 11/15/2021   Abnormal CT scan    Abnormal findings on radiological examination of gastrointestinal tract    Polyp of colon    Cancer of right kidney ([place]) 12/10/2018   Pulmonary nodules 12/01/2018   Mediastinal lymphadenopathy 12/01/2018   Heart block AV third degree ([place]) 10/07/2018   Pacemaker 10/07/2018   H/O aortic valve replacement 10/07/2018   Blood in stool    Bleeding duodenal ulcer    GIB (gastrointestinal bleeding) 06/30/2018   Acute upper GI bleed    Decreased activities of daily living (ADL) 06/09/2018   Decreased strength, endurance, and mobility 06/09/2018   Pleural effusion, right 06/09/2018   S/P AVR (aortic valve replacement) 06/08/2018   H/O ventricular septal myectomy 06/08/2018   Chronic anticoagulation 06/02/2018   Heart murmur, systolic 62/69/4854   H/O: rheumatic fever 03/21/2017   Tinnitus 10/30/2016   On warfarin therapy 07/06/2015   Arteriosclerosis of coronary artery 06/13/2015   Carcinoma in situ of bladder 06/13/2015   Diabetes mellitus, type 2 ([place]) 06/13/2015   Diabetic neuropathy ([place]) 06/13/2015   Dizziness 06/13/2015   Essential (primary) hypertension 06/13/2015   Fatigue  06/13/2015   Glaucoma 06/13/2015   Hypercholesteremia 06/13/2015   Male hypogonadism 06/13/2015   Adult hypothyroidism 06/13/2015   Arthritis, degenerative 06/13/2015   CAP (community acquired pneumonia) 06/13/2015   Adenocarcinoma, renal cell ([place]) 06/13/2015   Benign head tremor 12/13/2014   Carotid arterial disease ([place]) 07/12/2014   Decreased cardiac output 07/12/2014   Cardiomyopathy, hypertrophic obstructive ([place]) 07/12/2014   Head injuries 07/12/2014   HOCM (hypertrophic obstructive cardiomyopathy) ([place])    Encounter for therapeutic drug monitoring 11/16/2013   Obstructive sleep apnea    Benign prostatic hypertrophy without urinary obstruction 06/09/2012   History of neoplasm  of bladder 06/09/2012   Head injury    Atherosclerosis of aorta (HCC)    Hypotension    Stroke (HCC)    Unsteady gait    Excessive sweating    Nausea    ORTHOSTATIC HYPOTENSION, HX OF 09/01/2010   Overweight 03/07/2009   MITRAL REGURGITATION 03/05/2009   Chronic atrial fibrillation ([place]) 03/05/2009   Atrial fibrillation and flutter ([place]) 03/05/2009   Mitral and aortic incompetence 03/05/2009      REFERRING DIAG: Imbalance  THERAPY DIAG:  Difficulty in walking, not elsewhere classified  Other abnormalities of gait and mobility  Unsteadiness on feet  Muscle weakness (generalized)  Rationale for Evaluation and Treatment Rehabilitation  PERTINENT HISTORY:  Pt report she has been having balance issues secondary to his ears. Pt was in the artilerary. 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 Pt sold a home in april and has fallen 5 times since then. Pt tripped over a piece of wood in back yard and fell on one occassion. Pt has history of cardiac rehab [facility] at [facility]. Pt has a very difficult time with balance without his cane. He has difficulty with balance when heturns his head.   PRECAUTIONS: Fall  SUBJECTIVE: Pt doing well. Denies any new falls or  changes since last visit.   Presents to therapy with his wife;  PAIN:  Are you having pain? No     TODAY'S TREATMENT:  SPO2% 93 % following balance course, increased to 96% with 2 min of seated rest   Exercise/Activity Sets/Reps/Time/ Resistance Assistance Charge type Comments  Nustep progressive increase in resistance  Level 1,2,3 2 min ea  TE Cues for SPM and cues for focus on task   STS with airex pad and no UE support  2*8 reps with last 4 of each set with hands on knees compared to hands on chair arms  CGA, TA Good ant weight shift,  no need for assistance from PT.   Balance course: 1/2 foam, hurdle, 1/2 foam, cones throughout for sharp turns  X 2 laps with cane each direction  CGA ( [place] on first round)  NMR No LOB with any task this session will progress to airex stance next session.   Airex stance  3 x 30 seconds  CGA  NMR  Cues for no UE assist, improved balance with reps, difficulty with LOB ant/ posterior  Rocker board stance  3 x 30 seconds  Intermittent UE, CGA  NMR Difficulty maintianing balance a/p, working on incorporating ankle strategies.   LAQ 2 x 12 reps ea LE with 3# AW   TE Difficulty with TKE, visual cues improved efficacy         Treatment Provided this session   Pt educated throughout session about proper posture and technique with exercises. Improved exercise technique, movement at target joints, use of target muscles after min to mod verbal, visual, tactile cues. Note: Portions of this document were prepared using Dragon voice recognition software and although reviewed may contain unintentional dictation errors in syntax, grammar, or spelling.     PATIENT EDUCATION: Education details: Pt given verbal cuing on proper demonstration of technique during performance of exercises and as part of HEP. Person educated: Patient Education method: Explanation, Demonstration, Tactile cues, and Verbal cues Education comprehension: verbalized understanding, returned  demonstration, and verbal cues required   HOME EXERCISE PROGRAM:  Access Code: [place] URL: [email or web address] Date: 04/26/2022 Prepared by: [place]  Exercises -  Standing March with Counter Support  - 1 x daily - 7 x weekly - 3 sets - 10 reps - Standing Knee Flexion with Unilateral Counter Support  - 1 x daily - 7 x weekly - 3 sets - 10 reps - Heel Toe Raises with Unilateral Counter Support  - 1 x daily - 7 x weekly - 3 sets - 10 reps - Standing Hip Abduction with Unilateral Counter Support  - 1 x daily - 7 x weekly - 3 sets - 10 reps - Standing Hip Extension with Unilateral Counter Support  - 1 x daily - 7 x weekly - 3 sets - 10 reps   PT Short Term Goals        PT SHORT TERM GOAL #1   Title Patient will be independent in home exercise program to improve strength/mobility for better functional independence with ADLs.    Baseline no HEP    Time 4    Period Weeks    Status New    Target Date 04/16/22              PT Long Term Goals       PT LONG TERM GOAL #1   Title Patient will increase FOTO score to equal to or greater than   48  to demonstrate statistically significant improvement in mobility and quality of life.    Baseline 42    Time 8    Period Weeks    Status New    Target Date 06/11/22      PT LONG TERM GOAL #2   Title Patient  will complete five times sit to stand test in < 17 seconds indicating an increased LE strength and improved balance.    Baseline 23.53 sec    Time 12    Period Weeks    Status New    Target Date 06/11/22      PT LONG TERM GOAL #3   Title Patient will increase Berg Balance score by > 6 points to demonstrate decreased fall risk during functional activities.    Baseline 34    Time 12    Period Weeks    Status New      PT LONG TERM GOAL #4   Title Patient will increase six minute walk test distance to >1000 for progression to community ambulator and improve gait ability    Baseline test session 2    Time  12    Period Weeks    Status New    Target Date 06/11/22      PT LONG TERM GOAL #5   Title Patient will increase 10 meter walk test to >1.63ms as to improve gait speed for better community ambulation and to reduce fall risk.    Baseline to test session 2    Time 12    Period Weeks    Status New    Target Date 06/11/22              Plan     Clinical Impression Statement Continued with current plan of care as laid out in evaluation and recent prior sessions. Pt remains motivated to advance progress toward goals in order to maximize independence and safety at home. Pt requires high level assistance and cuing for completion of exercises in order to provide adequate level of stimulation and perturbation. Author allows pt as much opportunity as possible to perform independent righting strategies, only stepping in when pt is unable to prevent falling to floor. Pt making  progress with strength and balance this session with activities completed. Pt closely monitored throughout session for safe vitals response and to maximize patient safety during interventions. Pt continues to demonstrate progress toward goals AEB progression of some interventions this date either in volume or intensity.        Personal Factors and Comorbidities Age;Comorbidity 1;Comorbidity 2;Comorbidity 3+    Comorbidities CAD, Peripheral neuropathy, T2DM, HTN,    Examination-Activity Limitations Caring for Others;Carry;Locomotion Level;Stairs;Stand    Examination-Participation Restrictions Yard Work;Shop;Community Activity    Stability/Clinical Decision Making Evolving/Moderate complexity    Rehab Potential Fair    PT Frequency 2x / week    PT Duration 12 weeks    PT Treatment/Interventions ADLs/Self Care Home Management;Moist Heat;DME Instruction;Stair training;Functional mobility training;Therapeutic activities;Therapeutic exercise;Balance training;Neuromuscular re-education;Patient/family education;Wheelchair mobility  training;Manual techniques;Dry needling;Energy conservation    PT Next Visit Plan test 6MWT and 10MWT, also establish HEP for 2 weeks pt will be at [place] Provided visit 2    Consulted and Agree with Plan of Care Patient;Family member/caregiver              Particia Lather PT, DPT  06/07/22 12:48 PM   06/07/22, 12:48 PM        Pt educated throughout session about proper posture and technique with exercises. Improved exercise technique, movement at target joints, use of target muscles after min to mod verbal, visual, tactile cues. Note: Portions of this document were prepared using Dragon voice recognition software and although reviewed may contain unintentional dictation errors in syntax, grammar, or spelling.     PATIENT EDUCATION: Education details: Pt given verbal cuing on proper demonstration of technique during performance of exercises and as part of HEP. Person educated: Patient Education method: Explanation, Demonstration, Tactile cues, and Verbal cues Education comprehension: verbalized understanding, returned demonstration, and verbal cues required   HOME EXERCISE PROGRAM:  Access Code: [place] URL: [email or web address] Date: 04/26/2022 Prepared by: [place]  Exercises - Standing March with Counter Support  - 1 x daily - 7 x weekly - 3 sets - 10 reps - Standing Knee Flexion with Unilateral Counter Support  - 1 x daily - 7 x weekly - 3 sets - 10 reps - Heel Toe Raises with Unilateral Counter Support  - 1 x daily - 7 x weekly - 3 sets - 10 reps - Standing Hip Abduction with Unilateral Counter Support  - 1 x daily - 7 x weekly - 3 sets - 10 reps - Standing Hip Extension with Unilateral Counter Support  - 1 x daily - 7 x weekly - 3 sets - 10 reps   PT Short Term Goals        PT SHORT TERM GOAL #1   Title Patient will be independent in home exercise program to improve strength/mobility for better functional  independence with ADLs.    Baseline no HEP 8/15: Not completing regularly, lots of discussion about importance of comliance    Time 4    Period Weeks    Status New    Target Date 04/16/22              PT Long Term Goals       PT LONG TERM GOAL #1   Title Patient will increase FOTO score to equal to or greater than   48  to demonstrate statistically significant improvement in mobility and quality of life.    Baseline 42    Time 8  Period Weeks    Status Met    Target Date 06/11/22      PT LONG TERM GOAL #2   Title Patient  will complete five times sit to stand test in < 17 seconds indicating an increased LE strength and improved balance.    Baseline 23.53 sec 8/15: 17 sec heavy UE use    Time 12    Period Weeks    Status New    Target Date 06/11/22      PT LONG TERM GOAL #3   Title Patient will increase Berg Balance score by > 6 points to demonstrate decreased fall risk during functional activities.    Baseline 34,  8/15:38   Time 12    Period Weeks    Status New      PT LONG TERM GOAL #4   Title Patient will increase six minute walk test distance to >1000 for progression to community ambulator and improve gait ability    Baseline 725 feet    Time 12    Period Weeks    Status New    Target Date 06/11/22      PT LONG TERM GOAL #5   Title Patient will increase 10 meter walk test to >1.19ms as to improve gait speed for better community ambulation and to reduce fall risk.    Baseline .58 m/s   Time 12    Period Weeks    Status New    Target Date 06/11/22              Plan     Clinical Impression Statement         Personal Factors and Comorbidities Age;Comorbidity 1;Comorbidity 2;Comorbidity 3+    Comorbidities CAD, Peripheral neuropathy, T2DM, HTN,    Examination-Activity Limitations Caring for Others;Carry;Locomotion Level;Stairs;Stand    Examination-Participation Restrictions Yard Work;Shop;Community Activity    Stability/Clinical Decision  Making Evolving/Moderate complexity    Rehab Potential Fair    PT Frequency 2x / week    PT Duration 12 weeks    PT Treatment/Interventions ADLs/Self Care Home Management;Moist Heat;DME Instruction;Stair training;Functional mobility training;Therapeutic activities;Therapeutic exercise;Balance training;Neuromuscular re-education;Patient/family education;Wheelchair mobility training;Manual techniques;Dry needling;Energy conservation    PT Next Visit Plan test 6MWT and 10MWT, also establish HEP for [place]    Consulted and Agree with Plan of Care Patient;Family member/caregiver              CParticia LatherPT, DPT  06/07/22 12:48 PM   06/07/22, 12:48 PM

## 2022-06-08 ENCOUNTER — Other Ambulatory Visit: Payer: Self-pay | Admitting: Family Medicine

## 2022-06-08 DIAGNOSIS — M8008XA Age-related osteoporosis with current pathological fracture, vertebra(e), initial encounter for fracture: Secondary | ICD-10-CM

## 2022-06-08 NOTE — Telephone Encounter (Signed)
Requested medication (s) are due for refill today: yes  Requested medication (s) are on the active medication list: yes  Last refill:  07/07/21 4 tabs 11 RF  Future visit scheduled: yes in 1 month  Notes to clinic:  overdue lab work   Requested Prescriptions  Pending Prescriptions Disp Refills   alendronate (FOSAMAX) 70 MG tablet [Pharmacy Med Name: Alendronate Sodium 70 MG Oral Tablet] 12 tablet 0    Sig: TAKE 1 TABLET BY MOUTH ONCE A WEEK WITH  A  FULL  GLASS  OF  WATER  ON  AN  EMPTY  STOMACH     Endocrinology:  Bisphosphonates Failed - 06/08/2022  3:51 PM      Failed - Vitamin D in normal range and within 360 days    No results found for: "GG2694WN4", "OE7035KK9", "VD125OH2TOT", "25OHVITD3", "25OHVITD2", "25OHVITD1", "VD25OH"       Failed - Mg Level in normal range and within 360 days    Magnesium  Date Value Ref Range Status  12/01/2018 2.1 1.7 - 2.4 mg/dL Final    Comment:    Performed at [facility], [address] [place], [place] [address]         Failed - Phosphate in normal range and within 360 days    Phosphorus  Date Value Ref Range Status  12/01/2018 3.7 2.5 - 4.6 mg/dL Final    Comment:    Performed at [facility], [address] [place],  [address]         [place] in normal range and within 360 days    Calcium  Date Value Ref Range Status  12/15/2021 9.1 8.9 - 10.3 mg/dL Final         Passed - Cr in normal range and within 360 days    Creatinine, Ser  Date Value Ref Range Status  12/15/2021 0.91 0.61 - 1.24 mg/dL Final         Passed - eGFR is 30 or above and within 360 days    GFR calc Af Amer  Date Value Ref Range Status  10/25/2020 81 >59 mL/min/1.73 Final    Comment:    **In accordance with recommendations from the NKF-ASN Task force,**   [facility] is in the process of updating its eGFR calculation to the   2021 CKD-EPI creatinine equation that estimates kidney function   without a race variable.    GFR,  Estimated  Date Value Ref Range Status  12/15/2021 >60 >60 mL/min Final    Comment:    (NOTE) Calculated using the CKD-EPI Creatinine Equation (2021)    eGFR  Date Value Ref Range Status  11/15/2021 86 >59 mL/min/1.73 Final         Passed - Valid encounter within last 12 months    Recent Outpatient Visits           2 months ago Essential (primary) hypertension   [facility] [name], MD   4 months ago Mitral and aortic incompetence   [facility] [name], MD   6 months ago Type 2 diabetes mellitus without complication, without long-term current use of insulin [facility])   [facility] [name], MD   10 months ago Need for immunization against influenza   [facility], Dionne Bucy, MD   11 months ago Acute otitis externa of left ear, unspecified type   [facility] [name], MD  Future Appointments             In 1 month [name], MD [facility]            Passed - Bone Mineral Density or Dexa Scan completed in the last 2 years

## 2022-06-12 ENCOUNTER — Encounter: Payer: Self-pay | Admitting: Physical Therapy

## 2022-06-12 ENCOUNTER — Ambulatory Visit: Payer: PPO | Admitting: Physical Therapy

## 2022-06-12 DIAGNOSIS — R262 Difficulty in walking, not elsewhere classified: Secondary | ICD-10-CM

## 2022-06-12 DIAGNOSIS — R2689 Other abnormalities of gait and mobility: Secondary | ICD-10-CM

## 2022-06-12 DIAGNOSIS — R2681 Unsteadiness on feet: Secondary | ICD-10-CM

## 2022-06-12 NOTE — Therapy (Signed)
OUTPATIENT PHYSICAL THERAPY Recert and treatment note    Patient Name: [name] [name] MRN: [id number] DOB:08-02-1934, 88 y.o., male Today's Date: 06/12/2022  PCP: [name], MD REFERRING PROVIDER: [name], MD   PT End of Session - 06/12/22 1150     Visit Number 14    Number of Visits 24    Date for PT Re-Evaluation 09/04/22    Authorization Type [employer]    Authorization Time Period -06/11/22    Progress Note Due on Visit 20    PT Start Time 1147    PT Stop Time 1227    PT Time Calculation (min) 40 min    Equipment Utilized During Treatment Gait belt    Activity Tolerance Patient tolerated treatment well    Behavior During Therapy [facility] for tasks assessed/performed                 Past Medical History:  Diagnosis Date   Aortic stenosis    a. 01/2016 echo: mod AS; b. 04/2018 Cardiac MRI: sev AS; c. 05/2018 s/p mech AVR @ time of myomectomy; d. 07/2018 Echo: Mild AS/AI.   Atherosclerosis of aorta ([place])    by CT scan in past   Atrial fibrillation (HCC)    and aflutter. pt has a left atrial circuit that is not ablated. was on amiodarone-stopped, now use rate control.    Bladder cancer ([place])    hx; treated with BCG in past    Carotid artery disease ([place])    There was calcified plaque but no stenosis by carotid artery screening done at [facility] October, 2013   CHB (complete heart block) [facility])    a. 05/2018 s/p BSX VVIR PPM ([facility]) in setting of bradycardia following myomectomy and AVR/MVR.   Diabetes mellitus    type not specified   Diabetic neuropathy ([place])    feet   Elevated liver enzymes    over time; hx   Excessive sweating    Glaucoma    Gout    Head injury    when slipped on ice 2004-2005. stabilized and back on Coumadin    Headache    migraines - distant past   History of cardiac cath    a. 05/2018 Cath ([facility]): Non-obstructive CAD. Mildly elevated filling pressures w/ nl CI.   HOCM (hypertrophic  obstructive cardiomyopathy) ([place])    a. 01/2016 Echo: EF 65-70%, no rwma, LVOT gradient of 80-100mHg, mod AS, SAM; b. 11/2017 Echo: EF 55-60%, near cavity obliteration in systole, mod AS, mild MS; c. 04/2018 Card MRI ([facility]): Sev LVH, EF >70%, Sev AS, LVOT obs w/ Sev MR, mild to mod TR/PR, mid-myocardial HE in basal-mid anteroseptum and inferoseptum; d. 05/2018 s/p Septal myomectomy; e. 07/2018 Echo: EF 60-65%. PASP 353mg.   Homocystinemia ([place]   elevated, mild    Hypercholesterolemia    treated.    Hypertension    Infection of right inner ear    Kidney mass    a. s/p laproscopic surgery woth cryoablation of a mass [facility]: 2cm R kidney mass.   Mediastinal adenopathy    a. 12/208 CT Chest: up to 53m41mLL lung nodule. 1.5-1.7cm subcarinal/mediastinal adenopahty/right paratracheal lymph node; b. 11/2018 CT Chest: 2.5-3cm mediastinal adenopathy.   Mitral regurgitation    a. 04/2018 Cardiac MRI: sev MR; b. 05/2018 s/p mech MVR @ time of myomectomy; c. 07/2018 Echo: Mild MS.   Motion sickness    ocean  boats   Nausea    Nonobstructive coronary artery disease    a. 05/2018 Cath ([facility]): nonobs CAD.   Obstructive sleep apnea    CPAP started successfully 2014   Orthostatic hypotension    a. orthostatic. dehydration. hospitalized 11/11   Sleep apnea    Significant obstructive sleep apnea diagnosed in August, 2012, the patient is to receive CPAP    Stroke [facility])    "2 old strokes" CT and MRI. [place] [facility] 11/11. diagnosis was dehydration, no acutal reports.    TSH elevation    on synthroid historically    Unsteady gait    August, 2012   Past Surgical History:  Procedure Laterality Date   BLADDER SURGERY     CATARACT EXTRACTION W/PHACO Left 05/11/2015   Procedure: CATARACT EXTRACTION PHACO AND INTRAOCULAR LENS PLACEMENT ([place]);  Surgeon: Leandrew Koyanagi, MD;  Location: [place];  Service: Ophthalmology;  Laterality: Left;  DIABETIC - oral  meds, CPAP   COLONOSCOPY WITH PROPOFOL N/A 04/14/2019   Procedure: COLONOSCOPY WITH PROPOFOL;  Surgeon: [name], MD;  [facility] END[facility];  Service: Endoscopy;  Laterality: N/A;   ESOPHAGOGASTRODUODENOSCOPY (EGD) WITH PROPOFOL N/A 07/01/2018   Procedure: ESOPHAGOGASTRODUODENOSCOPY (EGD) WITH PROPOFOL;  Surgeon: [name], MD;  Location: [facility] ENDOSCOPY;  Service: Endoscopy;  Laterality: N/A;   INSERT / REPLACE / [place]     "froze mass"   MECHANICAL AORTIC AND MITRAL VALVE REPLACEMENT  05/2018   [facility]    TONSILLECTOMY     VALVE REPLACEMENT     Patient Active Problem List   Diagnosis Date Noted   Other abnormalities of gait and mobility 11/15/2021   Depression 11/15/2021   Encounter for fitting and adjustment of hearing aid 11/15/2021   Abnormal CT scan    Abnormal findings on radiological examination of gastrointestinal tract    Polyp of colon    Cancer of right kidney ([place]) 12/10/2018   Pulmonary nodules 12/01/2018   Mediastinal lymphadenopathy 12/01/2018   Heart block AV third degree ([place]) 10/07/2018   Pacemaker 10/07/2018   H/O aortic valve replacement 10/07/2018   Blood in stool    Bleeding duodenal ulcer    GIB (gastrointestinal bleeding) 06/30/2018   Acute upper GI bleed    Decreased activities of daily living (ADL) 06/09/2018   Decreased strength, endurance, and mobility 06/09/2018   Pleural effusion, right 06/09/2018   S/P AVR (aortic valve replacement) 06/08/2018   H/O ventricular septal myectomy 06/08/2018   Chronic anticoagulation 06/02/2018   Heart murmur, systolic 94/50/3888   H/O: rheumatic fever 03/21/2017   Tinnitus 10/30/2016   On warfarin therapy 07/06/2015   Arteriosclerosis of coronary artery 06/13/2015   Carcinoma in situ of bladder 06/13/2015   Diabetes mellitus, type 2 ([place]) 06/13/2015   Diabetic neuropathy ([place]) 06/13/2015   Dizziness 06/13/2015   Essential (primary) hypertension  06/13/2015   Fatigue 06/13/2015   Glaucoma 06/13/2015   Hypercholesteremia 06/13/2015   Male hypogonadism 06/13/2015   Adult hypothyroidism 06/13/2015   Arthritis, degenerative 06/13/2015   CAP (community acquired pneumonia) 06/13/2015   Adenocarcinoma, renal cell ([place]) 06/13/2015   Benign head tremor 12/13/2014   Carotid arterial disease ([place]) 07/12/2014   Decreased cardiac output 07/12/2014   Cardiomyopathy, hypertrophic obstructive ([place]) 07/12/2014   Head injuries 07/12/2014   HOCM (hypertrophic obstructive cardiomyopathy) ([place])    Encounter for therapeutic drug monitoring 11/16/2013   Obstructive sleep apnea    Benign prostatic hypertrophy without urinary  obstruction 06/09/2012   History of neoplasm of bladder 06/09/2012   Head injury    Atherosclerosis of aorta (HCC)    Hypotension    Stroke (HCC)    Unsteady gait    Excessive sweating    Nausea    ORTHOSTATIC HYPOTENSION, HX OF 09/01/2010   Overweight 03/07/2009   MITRAL REGURGITATION 03/05/2009   Chronic atrial fibrillation ([place]) 03/05/2009   Atrial fibrillation and flutter ([place]) 03/05/2009   Mitral and aortic incompetence 03/05/2009      REFERRING DIAG: Imbalance  THERAPY DIAG:  Difficulty in walking, not elsewhere classified  Other abnormalities of gait and mobility  Unsteadiness on feet  Rationale for Evaluation and Treatment Rehabilitation  PERTINENT HISTORY:  Pt report she has been having balance issues secondary to his ears. Pt was in the artilerary. Pt sold a home in april and has fallen 5 times since then. Pt tripped over a piece of wood in back yard and fell on one occassion. Pt has history of cardiac rehab [facility] at [facility]. Pt has a very difficult time with balance without his cane. He has difficulty with balance when he turns his head.   PRECAUTIONS: Fall  SUBJECTIVE: Pt doing well. Denies any new falls or changes since last visit.   Presents to therapy with his wife;  PAIN:  Are you having pain?  No  From 8/15 Progress note when all goals were assessed.  Exercise/Activity Sets/Reps/Time/ Resistance Assistance Charge type Comments  FOTO 54    Met goal   6MWT: 725 10MWT: .58 m/s       5XSTS 17 sec with UE use     Progressing   BERG - progressing      [facility] PT Assessment - 05/29/22 0001       Berg Balance Test   Sit to Stand Able to stand  independently using hands    Standing Unsupported Able to stand 2 minutes with supervision    Sitting with Back Unsupported but Feet Supported on Floor or Stool Able to sit safely and securely 2 minutes    Stand to Sit Controls descent by using hands    Transfers Able to transfer safely, definite need of hands    Standing Unsupported with Eyes Closed Able to stand 10 seconds with supervision    Standing Unsupported with Feet Together Able to place feet together independently and stand for 1 minute with supervision    From Standing, Reach Forward with Outstretched Arm Can reach forward >12 cm safely (5")    From Standing Position, Pick up Object from Floor Unable to pick up shoe, but reaches 2-5 cm (1-2") from shoe and balances independently    From Standing Position, Turn to Look Behind Over each Shoulder Turn sideways only but maintains balance    Turn 360 Degrees Able to turn 360 degrees safely but slowly    Standing Unsupported, Alternately Place Feet on Step/Stool Able to stand independently and complete 8 steps >20 seconds    Standing Unsupported, One Foot in Front Able to take small step independently and hold 30 seconds    Standing on One Leg Able to lift leg independently and hold equal to or more than 3 seconds    Total Score 38                 TODAY'S TREATMENT: 06/12/22  SPO2% 91 % following ambulation with 1.5# AW , increased to 96% with 2 min of seated rest   Exercise/Activity Sets/Reps/Time/ Resistance Assistance Charge  type Comments  Nustep interval training  Level 1,2,3 2 min ea  TE Cues for SPM and cues for focus on  task   STS with airex pad and no UE support  2*8 reps with last 4 of each set with hands on knees compared to hands on chair arms  CGA, TA Good ant weight shift,  no need for assistance from PT.   Step taps  X 10 ea LE alternating with 1.5# AW   TA No UE assist, good balnce response throughut, some difficulty with foot clearance   Balance course: 1/2 foam, hurdle, 1/2 foam, cones, step up and off, rex stance throughout for sharp turns  X 2 laps with cane each direction  CGA  and SPC NMR 1 LOB with airex step on and off    Airex stance  3 x 30 seconds  CGA  NMR  Cues for no UE assist, improved balance with reps, difficulty with LOB ant/ posterior  Rocker board stance  3 x 30 seconds  Intermittent UE, CGA  NMR Difficulty maintianing balance a/p, working on incorporating ankle strategies.   LAQ 2 x 12 reps ea LE with 3# AW   TE   Ambulaiton with 1.5# AW  X 200 feet   TA SOB following SPO2 measured at 91, recovers to 96 in 3 min seated rest/   Treatment Provided this session   Pt educated throughout session about proper posture and technique with exercises. Improved exercise technique, movement at target joints, use of target muscles after min to mod verbal, visual, tactile cues. Note: Portions of this document were prepared using Dragon voice recognition software and although reviewed may contain unintentional dictation errors in syntax, grammar, or spelling.         PATIENT EDUCATION: Education details: Pt given verbal cuing on proper demonstration of technique during performance of exercises and as part of HEP. Person educated: Patient Education method: Explanation, Demonstration, Tactile cues, and Verbal cues Education comprehension: verbalized understanding, returned demonstration, and verbal cues required   HOME EXERCISE PROGRAM:  Access Code: [place] URL: [email or web address] Date: 04/26/2022 Prepared by: [place]  Exercises - Standing March with Counter  Support  - 1 x daily - 7 x weekly - 3 sets - 10 reps - Standing Knee Flexion with Unilateral Counter Support  - 1 x daily - 7 x weekly - 3 sets - 10 reps - Heel Toe Raises with Unilateral Counter Support  - 1 x daily - 7 x weekly - 3 sets - 10 reps - Standing Hip Abduction with Unilateral Counter Support  - 1 x daily - 7 x weekly - 3 sets - 10 reps - Standing Hip Extension with Unilateral Counter Support  - 1 x daily - 7 x weekly - 3 sets - 10 reps   PT Short Term Goals        PT SHORT TERM GOAL #1   Title Patient will be independent in home exercise program to improve strength/mobility for better functional independence with ADLs.    Baseline no HEP 8/15: Not completing regularly, lots of discussion about importance of comliance    Time 4    Period Weeks    Status Progressing    Target Date 04/16/22              PT Long Term Goals       PT LONG TERM GOAL #1   Title Patient will increase FOTO score to equal to or greater than  48  to demonstrate statistically significant improvement in mobility and quality of life.    Baseline 42    Time 8    Period Weeks    Status Met    Target Date 06/11/22      PT LONG TERM GOAL #2   Title Patient  will complete five times sit to stand test in < 17 seconds indicating an increased LE strength and improved balance.    Baseline 23.53 sec 8/15: 17 sec heavy UE use    Time 12    Period Weeks    Status Ongoing     Target Date 06/11/22      PT LONG TERM GOAL #3   Title Patient will increase Berg Balance score by > 6 points to demonstrate decreased fall risk during functional activities.    Baseline 34,  8/15:38   Time 12    Period Weeks    Status Partially met       PT LONG TERM GOAL #4   Title Patient will increase six minute walk test distance to >1000 for progression to community ambulator and improve gait ability    Baseline 725 feet    Time 12    Period Weeks    Status Partially met     Target Date 06/11/22      PT LONG TERM  GOAL #5   Title Patient will increase 10 meter walk test to >1.70ms as to improve gait speed for better community ambulation and to reduce fall risk.    Baseline .58 m/s   Time 12    Period Weeks    Status Ongoing     Target Date 06/11/22                    Plan     Clinical Impression Statement Patient present physical therapy for recertification note.  Patient making good progress toward his goals at this time as evidenced by progress note completed 3 visits ago on August 15.  Although patient making good progress patient still is at risk for falls and will continue to benefit from skilled physical therapy to improve his balance, decreased risk of falls, improve his lower extremity strength, and improve his mobility and quality of life.Pt showing good progress with interventions this date in intensity as well as quality with several exercises. Patient's condition has the potential to improve in response to therapy. Maximum improvement is yet to be obtained. The anticipated improvement is attainable and reasonable in a generally predictable time.  Pt will continue to benefit from skilled physical therapy intervention to address impairments, improve QOL, and attain therapy goals.             Personal Factors and Comorbidities Age;Comorbidity 1;Comorbidity 2;Comorbidity 3+    Comorbidities CAD, Peripheral neuropathy, T2DM, HTN,    Examination-Activity Limitations Caring for Others;Carry;Locomotion Level;Stairs;Stand    Examination-Participation Restrictions Yard Work;Shop;Community Activity    Stability/Clinical Decision Making Evolving/Moderate complexity    Rehab Potential Fair    PT Frequency 2x / week    PT Duration 12 weeks    PT Treatment/Interventions ADLs/Self Care Home Management;Moist Heat;DME Instruction;Stair training;Functional mobility training;Therapeutic activities;Therapeutic exercise;Balance training;Neuromuscular re-education;Patient/family  education;Wheelchair mobility training;Manual techniques;Dry needling;Energy conservation    PT Next Visit Plan test 6MWT and 10MWT, also establish HEP for 2 [place]    Consulted and Agree with Plan of Care Patient;Family member/caregiver  Pt educated throughout session about proper posture and technique with exercises. Improved exercise technique, movement at target joints, use of target muscles after min to mod verbal, visual, tactile cues. Note: Portions of this document were prepared using Dragon voice recognition software and although reviewed may contain unintentional dictation errors in syntax, grammar, or spelling.     Particia Lather PT, DPT  06/12/22 2:09 PM   06/12/22, 2:09 PM

## 2022-06-13 ENCOUNTER — Ambulatory Visit: Payer: PPO | Attending: Cardiovascular Disease

## 2022-06-13 DIAGNOSIS — Z5181 Encounter for therapeutic drug level monitoring: Secondary | ICD-10-CM

## 2022-06-13 DIAGNOSIS — I482 Chronic atrial fibrillation, unspecified: Secondary | ICD-10-CM | POA: Diagnosis not present

## 2022-06-13 DIAGNOSIS — I4892 Unspecified atrial flutter: Secondary | ICD-10-CM

## 2022-06-13 DIAGNOSIS — I4891 Unspecified atrial fibrillation: Secondary | ICD-10-CM

## 2022-06-13 LAB — POCT INR: INR: 3.1 — AB (ref 2.0–3.0)

## 2022-06-13 NOTE — Patient Instructions (Signed)
Continue 1.5 tablets every day EXCEPT 1 tablet on MONDAYS & FRIDAYS.  Recheck INR in 2 weeks.

## 2022-06-14 ENCOUNTER — Encounter: Payer: Self-pay | Admitting: Physical Therapy

## 2022-06-14 ENCOUNTER — Ambulatory Visit: Payer: PPO | Admitting: Physical Therapy

## 2022-06-14 DIAGNOSIS — R262 Difficulty in walking, not elsewhere classified: Secondary | ICD-10-CM | POA: Diagnosis not present

## 2022-06-14 DIAGNOSIS — R2689 Other abnormalities of gait and mobility: Secondary | ICD-10-CM

## 2022-06-14 DIAGNOSIS — M6281 Muscle weakness (generalized): Secondary | ICD-10-CM

## 2022-06-14 DIAGNOSIS — R2681 Unsteadiness on feet: Secondary | ICD-10-CM

## 2022-06-14 NOTE — Therapy (Signed)
OUTPATIENT PHYSICAL THERAPY treatment note    Patient Name: [name] [name] MRN: [id number] DOB:1934/02/29, 88 y.o., male Today's Date: 06/14/2022  PCP: [name], MD REFERRING PROVIDER: [name], MD   PT End of Session - 06/14/22 1153     Visit Number 15    Number of Visits 24    Date for PT Re-Evaluation 09/04/22    Authorization Type [employer]    Authorization Time Period -06/11/22    Progress Note Due on Visit 20    PT Start Time 1150    PT Stop Time 1230    PT Time Calculation (min) 40 min    Equipment Utilized During Treatment Gait belt    Activity Tolerance Patient tolerated treatment well    Behavior During Therapy [facility] for tasks assessed/performed                 Past Medical History:  Diagnosis Date   Aortic stenosis    a. 01/2016 echo: mod AS; b. 04/2018 Cardiac MRI: sev AS; c. 05/2018 s/p mech AVR @ time of myomectomy; d. 07/2018 Echo: Mild AS/AI.   Atherosclerosis of aorta ([place])    by CT scan in past   Atrial fibrillation (HCC)    and aflutter. pt has a left atrial circuit that is not ablated. was on amiodarone-stopped, now use rate control.    Bladder cancer ([place])    hx; treated with BCG in past    Carotid artery disease ([place])    There was calcified plaque but no stenosis by carotid artery screening done at [facility] October, 2013   CHB (complete heart block) [facility])    a. 05/2018 s/p BSX VVIR PPM ([facility]) in setting of bradycardia following myomectomy and AVR/MVR.   Diabetes mellitus    type not specified   Diabetic neuropathy ([place])    feet   Elevated liver enzymes    over time; hx   Excessive sweating    Glaucoma    Gout    Head injury    when slipped on ice 2004-2005. stabilized and back on Coumadin    Headache    migraines - distant past   History of cardiac cath    a. 05/2018 Cath ([facility]): Non-obstructive CAD. Mildly elevated filling pressures w/ nl CI.   HOCM (hypertrophic obstructive  cardiomyopathy) ([place])    a. 01/2016 Echo: EF 65-70%, no rwma, LVOT gradient of 80-35mHg, mod AS, SAM; b. 11/2017 Echo: EF 55-60%, near cavity obliteration in systole, mod AS, mild MS; c. 04/2018 Card MRI ([facility]): Sev LVH, EF >70%, Sev AS, LVOT obs w/ Sev MR, mild to mod TR/PR, mid-myocardial HE in basal-mid anteroseptum and inferoseptum; d. 05/2018 s/p Septal myomectomy; e. 07/2018 Echo: EF 60-65%. PASP 338mg.   [place]ocystin[place]   elevated, mild    Hypercholesterolemia    treated.    Hypertension    Infection of right inner ear    Kidney mass    a. s/p laproscopic surgery woth cryoablation of a [facility] outside [facility]: 2cm R kidney mass.   Mediastinal adenopathy    a. 12/208 CT Chest: up to 37m74mLL lung nodule. 1.5-1.7cm subcarinal/mediastinal adenopahty/right paratracheal lymph node; b. 11/2018 CT Chest: 2.5-3cm mediastinal adenopathy.   Mitral regurgitation    a. 04/2018 Cardiac MRI: sev MR; b. 05/2018 s/p mech MVR @ time of myomectomy; c. 07/2018 Echo: Mild MS.   Motion sickness    ocean boats  Nausea    Nonobstructive coronary artery disease    a. 05/2018 Cath ([facility]): nonobs CAD.   Obstructive sleep apnea    CPAP started successfully 2014   Orthostatic hypotension    a. orthostatic. dehydration. hospitalized 11/11   Sleep apnea    Significant obstructive sleep apnea diagnosed in August, 2012, the patient is to receive CPAP    Stroke [facility])    "2 old strokes" CT and MRI. [place] hospital 11/11. diagnosis was dehydration, no acutal reports.    TSH elevation    on synthroid historically    Unsteady gait    August, 2012   Past Surgical History:  Procedure Laterality Date   BLADDER SURGERY     CATARACT EXTRACTION W/PHACO Left 05/11/2015   Procedure: CATARACT EXTRACTION PHACO AND INTRAOCULAR LENS PLACEMENT ([place]);  Surgeon: Leandrew Koyanagi, MD;  Location: [place];  Service: Ophthalmology;  Laterality: Left;  DIABETIC - oral meds, CPAP    COLONOSCOPY WITH PROPOFOL N/A 04/14/2019   Procedure: COLONOSCOPY WITH PROPOFOL;  Surgeon: [name], MD;  Location[facility] ENDOSCOPY;  Service: Endoscopy;  Laterality: N/A;   ESOPHAGOGASTRODUODENOSCOPY (EGD) WITH PROPOFOL N/A 07/01/2018   Procedure: ESOPHAGOGASTRODUODENOSCOPY (EGD) WITH PROPOFOL;  Surgeon: [name], MD;  Location: [facility] ENDOSCOPY;  Service: Endoscopy;  Laterality: N/A;   INSERT / REPLACE / [place]     "froze mass"   MECHANICAL AORTIC AND MITRAL VALVE REPLACEMENT  05/2018   [facility]    TONSILLECTOMY     VALVE REPLACEMENT     Patient Active Problem List   Diagnosis Date Noted   Other abnormalities of gait and mobility 11/15/2021   Depression 11/15/2021   Encounter for fitting and adjustment of hearing aid 11/15/2021   Abnormal CT scan    Abnormal findings on radiological examination of gastrointestinal tract    Polyp of colon    Cancer of right kidney ([place]) 12/10/2018   Pulmonary nodules 12/01/2018   Mediastinal lymphadenopathy 12/01/2018   Heart block AV third degree ([place]) 10/07/2018   Pacemaker 10/07/2018   H/O aortic valve replacement 10/07/2018   Blood in stool    Bleeding duodenal ulcer    GIB (gastrointestinal bleeding) 06/30/2018   Acute upper GI bleed    Decreased activities of daily living (ADL) 06/09/2018   Decreased strength, endurance, and mobility 06/09/2018   Pleural effusion, right 06/09/2018   S/P AVR (aortic valve replacement) 06/08/2018   H/O ventricular septal myectomy 06/08/2018   Chronic anticoagulation 06/02/2018   Heart murmur, systolic 76/19/5093   H/O: rheumatic fever 03/21/2017   Tinnitus 10/30/2016   On warfarin therapy 07/06/2015   Arteriosclerosis of coronary artery 06/13/2015   Carcinoma in situ of bladder 06/13/2015   Diabetes mellitus, type 2 ([place]) 06/13/2015   Diabetic neuropathy ([place]) 06/13/2015   Dizziness 06/13/2015   Essential (primary) hypertension 06/13/2015   Fatigue  06/13/2015   Glaucoma 06/13/2015   Hypercholesteremia 06/13/2015   Male hypogonadism 06/13/2015   Adult hypothyroidism 06/13/2015   Arthritis, degenerative 06/13/2015   CAP (community acquired pneumonia) 06/13/2015   Adenocarcinoma, renal cell ([place]) 06/13/2015   Benign head tremor 12/13/2014   Carotid arterial disease ([place]) 07/12/2014   Decreased cardiac output 07/12/2014   Cardiomyopathy, hypertrophic obstructive ([place]) 07/12/2014   Head injuries 07/12/2014   HOCM (hypertrophic obstructive cardiomyopathy) ([place])    Encounter for therapeutic drug monitoring 11/16/2013   Obstructive sleep apnea    Benign prostatic hypertrophy without urinary obstruction 06/09/2012  History of neoplasm of bladder 06/09/2012   Head injury    Atherosclerosis of aorta (HCC)    Hypotension    Stroke (HCC)    Unsteady gait    Excessive sweating    Nausea    ORTHOSTATIC HYPOTENSION, HX OF 09/01/2010   Overweight 03/07/2009   MITRAL REGURGITATION 03/05/2009   Chronic atrial fibrillation ([place]) 03/05/2009   Atrial fibrillation and flutter ([place]) 03/05/2009   Mitral and aortic incompetence 03/05/2009      REFERRING DIAG: Imbalance  THERAPY DIAG:  Difficulty in walking, not elsewhere classified  Other abnormalities of gait and mobility  Unsteadiness on feet  Muscle weakness (generalized)  Rationale for Evaluation and Treatment Rehabilitation  PERTINENT HISTORY:  Pt report she has been having balance issues secondary to his ears. Pt was in the artilerary. Pt sold a home in april and has fallen 5 times since then. Pt tripped over a piece of wood in back yard and fell on one occassion. Pt has history of cardiac rehab [facility] at [facility]. Pt has a very difficult time with balance without his cane. He has difficulty with balance when he turns his head.   PRECAUTIONS: Fall  SUBJECTIVE: Pt doing well. Denies any new falls or changes since last visit.   Presents to therapy with his wife;  PAIN:  Are you  having pain? No     TODAY'S TREATMENT: 06/14/22  SPO2% 91 % following ambulation with 1.5# AW , increased to 96% with 2 min of seated rest   Exercise/Activity Sets/Reps/Time/ Resistance Assistance Charge type Comments  STS with airex pad and no UE support  2*10 reps with all hands on knees  CGA, TA Good ant weight shift,  no need for assistance from PT.   Step taps  X 10 ea LE alternating with 1.5# AW  No UE TA No UE assist, good balnce response throughut, improved foot clearance this session, did get closer to step throuhgout and required cues for proper positioning of feet   Balance course: 1/2 foam, , 1/2 foam, cones, step up walk short distance and step and off X 2 laps without AD each direction  CGA   NMR Errors with stepping off on 3/4 ronuds, no erros elsewhere despite not using AD   Airex stance  3 x 30 seconds  CGA  NMR  Cues for no UE assist, improved balance with reps, difficulty with LOB ant/ posterior  1/2 foam roller rocks  X 20  UE assist  TE To improve A/P balance reactions   LAQ X 10 with 4# AW   TE   Ambulaiton with 1.5# AW with intermittent retro ambulation  X 200 feet   TA Difficulty with transitions from forward to retro gait. Second bout no SPC with ambulation, slower pace but no LOB noted, no retro gait on second round.   Treatment Provided this session   Pt educated throughout session about proper posture and technique with exercises. Improved exercise technique, movement at target joints, use of target muscles after min to mod verbal, visual, tactile cues. Note: Portions of this document were prepared using Dragon voice recognition software and although reviewed may contain unintentional dictation errors in syntax, grammar, or spelling.         PATIENT EDUCATION: Education details: Pt given verbal cuing on proper demonstration of technique during performance of exercises and as part of HEP. Person educated: Patient Education method: Explanation, Demonstration,  Tactile cues, and Verbal cues Education comprehension: verbalized understanding, returned demonstration, and  verbal cues required   HOME EXERCISE PROGRAM:  Access Code: [place]: [email or web address] Date: 04/26/2022 Prepared by: [place]  Exercises - Standing March with Counter Support  - 1 x daily - 7 x weekly - 3 sets - 10 reps - Standing Knee Flexion with Unilateral Counter Support  - 1 x daily - 7 x weekly - 3 sets - 10 reps - Heel Toe Raises with Unilateral Counter Support  - 1 x daily - 7 x weekly - 3 sets - 10 reps - Standing Hip Abduction with Unilateral Counter Support  - 1 x daily - 7 x weekly - 3 sets - 10 reps - Standing Hip Extension with Unilateral Counter Support  - 1 x daily - 7 x weekly - 3 sets - 10 reps   PT Short Term Goals        PT SHORT TERM GOAL #1   Title Patient will be independent in home exercise program to improve strength/mobility for better functional independence with ADLs.    Baseline no HEP 8/15: Not completing regularly, lots of discussion about importance of comliance    Time 4    Period Weeks    Status Progressing    Target Date 04/16/22              PT Long Term Goals       PT LONG TERM GOAL #1   Title Patient will increase FOTO score to equal to or greater than   48  to demonstrate statistically significant improvement in mobility and quality of life.    Baseline 42    Time 8    Period Weeks    Status Met    Target Date 06/11/22      PT LONG TERM GOAL #2   Title Patient  will complete five times sit to stand test in < 17 seconds indicating an increased LE strength and improved balance.    Baseline 23.53 sec 8/15: 17 sec heavy UE use    Time 12    Period Weeks    Status Ongoing     Target Date 06/11/22      PT LONG TERM GOAL #3   Title Patient will increase Berg Balance score by > 6 points to demonstrate decreased fall risk during functional activities.    Baseline 34,  8/15:38   Time 12    Period  Weeks    Status Partially met       PT LONG TERM GOAL #4   Title Patient will increase six minute walk test distance to >1000 for progression to community ambulator and improve gait ability    Baseline 725 feet    Time 12    Period Weeks    Status Partially met     Target Date 06/11/22      PT LONG TERM GOAL #5   Title Patient will increase 10 meter walk test to >1.23ms as to improve gait speed for better community ambulation and to reduce fall risk.    Baseline .58 m/s   Time 12    Period Weeks    Status Ongoing     Target Date 06/11/22                    Plan     Clinical Impression Statement Continued with current plan of care as laid out in evaluation and recent prior sessions. Pt remains motivated to advance progress toward goals in order to maximize independence and safety at  home. Pt requires high level assistance and cuing for completion of exercises in order to provide adequate level of stimulation and perturbation. Author allows pt as much opportunity as possible to perform independent righting strategies, only stepping in when pt is unable to prevent falling to floor.  Patient shows good progress with ambulation without assistive device this session indicating improvement in motor control as well as improved confidence with activities.  Pt closely monitored throughout session for safe vitals response and to maximize patient safety during interventions. Pt continues to demonstrate progress toward goals AEB progression of some interventions this date either in volume or intensity.         Personal Factors and Comorbidities Age;Comorbidity 1;Comorbidity 2;Comorbidity 3+    Comorbidities CAD, Peripheral neuropathy, T2DM, HTN,    Examination-Activity Limitations Caring for Others;Carry;Locomotion Level;Stairs;Stand    Examination-Participation Restrictions Yard Work;Shop;Community Activity    Stability/Clinical Decision Making Evolving/Moderate complexity    Rehab  Potential Fair    PT Frequency 2x / week    PT Duration 12 weeks    PT Treatment/Interventions ADLs/Self Care Home Management;Moist Heat;DME Instruction;Stair training;Functional mobility training;Therapeutic activities;Therapeutic exercise;Balance training;Neuromuscular re-education;Patient/family education;Wheelchair mobility training;Manual techniques;Dry needling;Energy conservation    PT Next Visit Plan test 6MWT and 10MWT, also establish HEP for 2 weeks pt will be at [place] Provided visit 2    Consulted and Agree with Plan of Care Patient;Family member/caregiver                Particia Lather PT, DPT  06/14/22 2:15 PM   06/14/22, 2:15 PM

## 2022-06-16 ENCOUNTER — Other Ambulatory Visit: Payer: Self-pay | Admitting: Family Medicine

## 2022-06-16 ENCOUNTER — Other Ambulatory Visit: Payer: Self-pay | Admitting: Internal Medicine

## 2022-06-16 DIAGNOSIS — K279 Peptic ulcer, site unspecified, unspecified as acute or chronic, without hemorrhage or perforation: Secondary | ICD-10-CM

## 2022-06-18 DIAGNOSIS — Z950 Presence of cardiac pacemaker: Secondary | ICD-10-CM | POA: Diagnosis not present

## 2022-06-19 ENCOUNTER — Encounter: Payer: Self-pay | Admitting: Physical Therapy

## 2022-06-19 ENCOUNTER — Ambulatory Visit: Payer: PPO | Attending: Family Medicine | Admitting: Physical Therapy

## 2022-06-19 DIAGNOSIS — M6281 Muscle weakness (generalized): Secondary | ICD-10-CM | POA: Insufficient documentation

## 2022-06-19 DIAGNOSIS — R2689 Other abnormalities of gait and mobility: Secondary | ICD-10-CM | POA: Insufficient documentation

## 2022-06-19 DIAGNOSIS — R2681 Unsteadiness on feet: Secondary | ICD-10-CM | POA: Insufficient documentation

## 2022-06-19 DIAGNOSIS — R262 Difficulty in walking, not elsewhere classified: Secondary | ICD-10-CM | POA: Diagnosis not present

## 2022-06-19 NOTE — Therapy (Signed)
OUTPATIENT PHYSICAL THERAPY treatment note    Patient Name: [name] [name] MRN: [id number] DOB:11-05-1933, 88 y.o., male Today's Date: 06/19/2022  PCP: [name], MD REFERRING PROVIDER: [name], MD   PT End of Session - 06/19/22 1140     Visit Number 16    Number of Visits 24    Date for PT Re-Evaluation 09/04/22    Authorization Type [employer]    Authorization Time Period -06/11/22    Progress Note Due on Visit 20    PT Start Time 1147    PT Stop Time 1230    PT Time Calculation (min) 43 min    Equipment Utilized During Treatment Gait belt    Activity Tolerance Patient tolerated treatment well    Behavior During Therapy [facility] for tasks assessed/performed                  Past Medical History:  Diagnosis Date   Aortic stenosis    a. 01/2016 echo: mod AS; b. 04/2018 Cardiac MRI: sev AS; c. 05/2018 s/p mech AVR @ time of myomectomy; d. 07/2018 Echo: Mild AS/AI.   Atherosclerosis of aorta ([place])    by CT scan in past   Atrial fibrillation (HCC)    and aflutter. pt has a left atrial circuit that is not ablated. was on amiodarone-stopped, now use rate control.    Bladder cancer ([place])    hx; treated with BCG in past    Carotid artery disease ([place])    There was calcified plaque but no stenosis by carotid artery screening done at [facility] October, 2013   CHB (complete heart block) [facility])    a. 05/2018 s/p BSX VVIR PPM ([facility]) in setting of bradycardia following myomectomy and AVR/MVR.   Diabetes mellitus    type not specified   Diabetic neuropathy ([place])    feet   Elevated liver enzymes    over time; hx   Excessive sweating    Glaucoma    Gout    Head injury    when slipped on ice 2004-2005. stabilized and back on Coumadin    Headache    migraines - distant past   History of cardiac cath    a. 05/2018 Cath ([facility]): Non-obstructive CAD. Mildly elevated filling pressures w/ nl CI.   HOCM (hypertrophic obstructive  cardiomyopathy) ([place])    a. 01/2016 Echo: EF 65-70%, no rwma, LVOT gradient of 80-40mHg, mod AS, SAM; b. 11/2017 Echo: EF 55-60%, near cavity obliteration in systole, mod AS, mild MS; c. 04/2018 Card MRI ([facility]): Sev LVH, EF >70%, Sev AS, LVOT obs w/ Sev MR, mild to mod TR/PR, mid-myocardial HE in basal-mid anteroseptum and inferoseptum; d. 05/2018 s/p Septal myomectomy; e. 07/2018 Echo: EF 60-65%. PASP 383mg.   Homocystinemia ([place]   elevated, mild    Hypercholesterolemia    treated.    Hypertension    Infection of right inner ear    Kidney mass    a. s/p laproscopic surgery woth cryoablation of a mass outside [facility]2018 CT Chest: 2cm R kidney mass.   Mediastinal adenopathy    a. 12/208 CT Chest: up to 4m28mLL lung nodule. 1.5-1.7cm subcarinal/mediastinal adenopahty/right paratracheal lymph node; b. 11/2018 CT Chest: 2.5-3cm mediastinal adenopathy.   Mitral regurgitation    a. 04/2018 Cardiac MRI: sev MR; b. 05/2018 s/p mech MVR @ time of myomectomy; c. 07/2018 Echo: Mild MS.   Motion sickness    ocean boats  Nausea    Nonobstructive coronary artery disease    a. 05/2018 Cath ([facility]): nonobs CAD.   Obstructive sleep apnea    CPAP started successfully 2014   Orthostatic hypotension    a. orthostatic. dehydration. hospitalized 11/11   Sleep apnea    Significant obstructive sleep apnea diagnosed in August, 2012, the patient is to receive CPAP    Stroke [facility])    "2 old strokes" CT and MRI. [place] [facility] 11/11. diagnosis was dehydration, no acutal reports.    TSH elevation    on synthroid historically    Unsteady gait    August, 2012   Past Surgical History:  Procedure Laterality Date   BLADDER SURGERY     CATARACT EXTRACTION W/PHACO Left 05/11/2015   Procedure: CATARACT EXTRACTION PHACO AND INTRAOCULAR LENS PLACEMENT ([place]);  Surgeon: Leandrew Koyanagi, MD;  Location: [place];  Service: Ophthalmology;  Laterality: Left;  DIABETIC - oral meds, CPAP    COLONOSCOPY WITH PROPOFOL N/A 04/14/2019   Procedure: COLONOSCOPY WITH PROPOFOL;  Surgeon: [name], MD;  Location: [facility] ENDOSCOPY;  Service: Endoscopy;  Laterality: N/A;   ESOPHAGOGASTRODUODENOSCOPY (EGD) WITH PROPOFOL N/A 07/01/2018   Procedure: ESOPHAGOGASTRODUODENOSCOPY (EGD) WITH PROPOFOL;  Surgeon: [name], MD;  Location: [facility] ENDOSCOPY;  Service: Endoscopy;  Laterality: N/A;   INSERT / REPLACE / [place]     "froze mass"   MECHANICAL AORTIC AND MITRAL VALVE REPLACEMENT  05/2018   [facility]    TONSILLECTOMY     VALVE REPLACEMENT     Patient Active Problem List   Diagnosis Date Noted   Other abnormalities of gait and mobility 11/15/2021   Depression 11/15/2021   Encounter for fitting and adjustment of hearing aid 11/15/2021   Abnormal CT scan    Abnormal findings on radiological examination of gastrointestinal tract    Polyp of colon    Cancer of right kidney ([place]) 12/10/2018   Pulmonary nodules 12/01/2018   Mediastinal lymphadenopathy 12/01/2018   Heart block AV third degree ([place]) 10/07/2018   Pacemaker 10/07/2018   H/O aortic valve replacement 10/07/2018   Blood in stool    Bleeding duodenal ulcer    GIB (gastrointestinal bleeding) 06/30/2018   Acute upper GI bleed    Decreased activities of daily living (ADL) 06/09/2018   Decreased strength, endurance, and mobility 06/09/2018   Pleural effusion, right 06/09/2018   S/P AVR (aortic valve replacement) 06/08/2018   H/O ventricular septal myectomy 06/08/2018   Chronic anticoagulation 06/02/2018   Heart murmur, systolic 60/63/0160   H/O: rheumatic fever 03/21/2017   Tinnitus 10/30/2016   On warfarin therapy 07/06/2015   Arteriosclerosis of coronary artery 06/13/2015   Carcinoma in situ of bladder 06/13/2015   Diabetes mellitus, type 2 ([place]) 06/13/2015   Diabetic neuropathy ([place]) 06/13/2015   Dizziness 06/13/2015   Essential (primary) hypertension 06/13/2015   Fatigue  06/13/2015   Glaucoma 06/13/2015   Hypercholesteremia 06/13/2015   Male hypogonadism 06/13/2015   Adult hypothyroidism 06/13/2015   Arthritis, degenerative 06/13/2015   CAP (community acquired pneumonia) 06/13/2015   Adenocarcinoma, renal cell ([place]) 06/13/2015   Benign head tremor 12/13/2014   Carotid arterial disease ([place]) 07/12/2014   Decreased cardiac output 07/12/2014   Cardiomyopathy, hypertrophic obstructive ([place]) 07/12/2014   Head injuries 07/12/2014   HOCM (hypertrophic obstructive cardiomyopathy) ([place])    Encounter for therapeutic drug monitoring 11/16/2013   Obstructive sleep apnea    Benign prostatic hypertrophy without urinary obstruction 06/09/2012  History of neoplasm of bladder 06/09/2012   Head injury    Atherosclerosis of aorta (HCC)    Hypotension    Stroke (HCC)    Unsteady gait    Excessive sweating    Nausea    ORTHOSTATIC HYPOTENSION, HX OF 09/01/2010   Overweight 03/07/2009   MITRAL REGURGITATION 03/05/2009   Chronic atrial fibrillation ([place]) 03/05/2009   Atrial fibrillation and flutter ([place]) 03/05/2009   Mitral and aortic incompetence 03/05/2009      REFERRING DIAG: Imbalance  THERAPY DIAG:  Difficulty in walking, not elsewhere classified  Other abnormalities of gait and mobility  Unsteadiness on feet  Muscle weakness (generalized)  Rationale for Evaluation and Treatment Rehabilitation  PERTINENT HISTORY:  Pt report she has been having balance issues secondary to his ears. Pt was in the artilerary. Pt sold a home in april and has fallen 5 times since then. Pt tripped over a piece of wood in back yard and fell on one occassion. Pt has history of cardiac rehab here at [facility]. Pt has a very difficult time with balance without his cane. He has difficulty with balance when he turns his head.   PRECAUTIONS: Fall  SUBJECTIVE: Pt doing well. Denies any new falls or changes since last visit.   Presents to therapy with his wife;  PAIN:  Are you  having pain? No     TODAY'S TREATMENT: 06/19/22    Exercise/Activity Sets/Reps/Time/ Resistance Assistance Charge type Comments  STS with airex pad and no UE support  2*10 reps with all hands on knees  CGA, TA Good ant weight shift,  no need for assistance from PT.   Step taps anterior and lateral X 10 ea LE alternating with 1.5# AW  No UE TA No UE assist, good balnce response throughut, improved foot clearance this session, did get closer to step throuhgout and required cues for proper positioning of feet   Balance course: 1/2 foam, , 1/2 foam, red mat, step up walk short distance and step and off, airex pad, hurdle  X 2 laps without AD each direction  CGA   NMR 1 error with stepping on / off step trainer but after first error completes successfully on subsequent rounds. Increased challenge of imbalance course this session with good overall response   Airex stance EC  3 x 30 seconds  CGA  NMR  Difficulty on first round, improvement noted with practice.   1/2 foam roller rocks  X 20  UE assist  TE To improve A/P balance reactions   LAQ X 10 with 4# AW   TE   Ambulaiton with 1.5# AW with intermittent retro ambulation  2 X 200 feet   TA Difficulty with transitions from forward to retro gait. Second bout no SPC with ambulation, slower pace but no LOB noted, no retro gait on second round.   Treatment Provided this session   Pt educated throughout session about proper posture and technique with exercises. Improved exercise technique, movement at target joints, use of target muscles after min to mod verbal, visual, tactile cues. Note: Portions of this document were prepared using Dragon voice recognition software and although reviewed may contain unintentional dictation errors in syntax, grammar, or spelling.         PATIENT EDUCATION: Education details: Pt given verbal cuing on proper demonstration of technique during performance of exercises and as part of HEP. Person educated:  Patient Education method: Explanation, Demonstration, Tactile cues, and Verbal cues Education comprehension: verbalized understanding, returned demonstration,  and verbal cues required   HOME EXERCISE PROGRAM:  Access Code: [place]: [email or web address] Date: 04/26/2022 Prepared by: Aurora Nation  Exercises - Standing March with Counter Support  - 1 x daily - 7 x weekly - 3 sets - 10 reps - Standing Knee Flexion with Unilateral Counter Support  - 1 x daily - 7 x weekly - 3 sets - 10 reps - Heel Toe Raises with Unilateral Counter Support  - 1 x daily - 7 x weekly - 3 sets - 10 reps - Standing Hip Abduction with Unilateral Counter Support  - 1 x daily - 7 x weekly - 3 sets - 10 reps - Standing Hip Extension with Unilateral Counter Support  - 1 x daily - 7 x weekly - 3 sets - 10 reps   PT Short Term Goals        PT SHORT TERM GOAL #1   Title Patient will be independent in home exercise program to improve strength/mobility for better functional independence with ADLs.    Baseline no HEP 8/15: Not completing regularly, lots of discussion about importance of comliance    Time 4    Period Weeks    Status Progressing    Target Date 04/16/22              PT Long Term Goals       PT LONG TERM GOAL #1   Title Patient will increase FOTO score to equal to or greater than   48  to demonstrate statistically significant improvement in mobility and quality of life.    Baseline 42    Time 8    Period Weeks    Status Met    Target Date 06/11/22      PT LONG TERM GOAL #2   Title Patient  will complete five times sit to stand test in < 17 seconds indicating an increased LE strength and improved balance.    Baseline 23.53 sec 8/15: 17 sec heavy UE use    Time 12    Period Weeks    Status Ongoing     Target Date 06/11/22      PT LONG TERM GOAL #3   Title Patient will increase Berg Balance score by > 6 points to demonstrate decreased fall risk during functional  activities.    Baseline 34,  8/15:38   Time 12    Period Weeks    Status Partially met       PT LONG TERM GOAL #4   Title Patient will increase six minute walk test distance to >1000 for progression to community ambulator and improve gait ability    Baseline 725 feet    Time 12    Period Weeks    Status Partially met     Target Date 06/11/22      PT LONG TERM GOAL #5   Title Patient will increase 10 meter walk test to >1.39ms as to improve gait speed for better community ambulation and to reduce fall risk.    Baseline .58 m/s   Time 12    Period Weeks    Status Ongoing     Target Date 06/11/22                    Plan     Clinical Impression Statement Continued with current plan of care as laid out in evaluation and recent prior sessions. Pt remains motivated to advance progress toward goals in order to maximize independence and safety  at home. Pt requires high level assistance and cuing for completion of exercises in order to provide adequate level of stimulation and perturbation. Author allows pt as much opportunity as possible to perform independent righting strategies, only stepping in when pt is unable to prevent falling to floor.  Patient shows good progress with ambulation without assistive device this session indicating improvement in motor control as well as improved confidence with activities. Pt also showing progress with ambulation with obstacles and changing surfaces indicating improvement in ambulation safety without AD.   Pt closely monitored throughout session for safe vitals response and to maximize patient safety during interventions. Pt continues to demonstrate progress toward goals AEB progression of some interventions this date either in volume or intensity.         Personal Factors and Comorbidities Age;Comorbidity 1;Comorbidity 2;Comorbidity 3+    Comorbidities CAD, Peripheral neuropathy, T2DM, HTN,    Examination-Activity Limitations Caring for  Others;Carry;Locomotion Level;Stairs;Stand    Examination-Participation Restrictions Yard Work;Shop;Community Activity    Stability/Clinical Decision Making Evolving/Moderate complexity    Rehab Potential Fair    PT Frequency 2x / week    PT Duration 12 weeks    PT Treatment/Interventions ADLs/Self Care Home Management;Moist Heat;DME Instruction;Stair training;Functional mobility training;Therapeutic activities;Therapeutic exercise;Balance training;Neuromuscular re-education;Patient/family education;Wheelchair mobility training;Manual techniques;Dry needling;Energy conservation    PT Next Visit Plan test 6MWT and 10MWT, also establish HEP for 2 weeks pt will be at [place] Provided visit 2    Consulted and Agree with Plan of Care Patient;Family member/caregiver                Particia Lather PT, DPT  06/19/22 12:47 PM   06/19/22, 12:47 PM

## 2022-06-19 NOTE — Telephone Encounter (Signed)
Refill Request.  

## 2022-06-20 ENCOUNTER — Other Ambulatory Visit: Payer: Self-pay | Admitting: Family Medicine

## 2022-06-20 DIAGNOSIS — K279 Peptic ulcer, site unspecified, unspecified as acute or chronic, without hemorrhage or perforation: Secondary | ICD-10-CM

## 2022-06-21 ENCOUNTER — Ambulatory Visit: Payer: PPO | Admitting: Physical Therapy

## 2022-06-21 ENCOUNTER — Encounter: Payer: Self-pay | Admitting: Physical Therapy

## 2022-06-21 DIAGNOSIS — M6281 Muscle weakness (generalized): Secondary | ICD-10-CM

## 2022-06-21 DIAGNOSIS — R2689 Other abnormalities of gait and mobility: Secondary | ICD-10-CM

## 2022-06-21 DIAGNOSIS — R262 Difficulty in walking, not elsewhere classified: Secondary | ICD-10-CM | POA: Diagnosis not present

## 2022-06-21 DIAGNOSIS — R2681 Unsteadiness on feet: Secondary | ICD-10-CM

## 2022-06-21 NOTE — Therapy (Signed)
OUTPATIENT PHYSICAL THERAPY treatment note    Patient Name: [name] [name] MRN: [id number] DOB:[birth year], 88 y.o., male Today's Date: 06/21/2022  PCP: [name], MD REFERRING PROVIDER: [name], MD   PT End of Session - 06/21/22 1134     Visit Number 17    Number of Visits 24    Date for PT Re-Evaluation 09/04/22    Authorization Type [employer]    Authorization Time Period -06/11/22    Progress Note Due on Visit 49    PT Start Time 1135    PT Stop Time 1215    PT Time Calculation (min) 40 min    Equipment Utilized During Treatment Gait belt    Activity Tolerance Patient tolerated treatment well    Behavior During Therapy [facility] for tasks assessed/performed                   Past Medical History:  Diagnosis Date   Aortic stenosis    a. 01/2016 echo: mod AS; b. 04/2018 Cardiac MRI: sev AS; c. 05/2018 s/p mech AVR @ time of myomectomy; d. 07/2018 Echo: Mild AS/AI.   Atherosclerosis of aorta ([place])    by CT scan in past   Atrial fibrillation (HCC)    and aflutter. pt has a left atrial circuit that is not ablated. was on amiodarone-stopped, now use rate control.    Bladder cancer ([place])    hx; treated with BCG in past    Carotid artery disease ([place])    There was calcified plaque but no stenosis by carotid artery screening done at [facility] October, 2013   CHB (complete heart block) [facility])    a. 05/2018 s/p BSX VVIR PPM ([facility]) in setting of bradycardia following myomectomy and AVR/MVR.   Diabetes mellitus    type not specified   Diabetic neuropathy ([place])    feet   Elevated liver enzymes    over time; hx   Excessive sweating    Glaucoma    Gout    Head injury    when slipped on ice 2004-2005. stabilized and back on Coumadin    Headache    migraines - distant past   History of cardiac cath    a. 05/2018 Cath ([facility]): Non-obstructive CAD. Mildly elevated filling pressures w/ nl CI.   HOCM (hypertrophic obstructive  cardiomyopathy) ([place])    a. 01/2016 Echo: EF 65-70%, no rwma, LVOT gradient of 80-14mHg, mod AS, SAM; b. 11/2017 Echo: EF 55-60%, near cavity obliteration in systole, mod AS, mild MS; c. 04/2018 Card MRI ([facility]): Sev LVH, EF >70%, Sev AS, LVOT obs w/ Sev MR, mild to mod TR/PR, mid-myocardial HE in basal-mid anteroseptum and inferoseptum; d. 05/2018 s/p Septal myomectomy; e. 07/2018 Echo: EF 60-65%. PASP 3356mg.   Homocystinemia ([facility] [employer]   elevated, mild    Hypercholesterolemia    treated.    Hypertension    Infection of right inner ear    Kidney mass    a. s/p laproscopic surgery woth cryoablation of a mass outside [facility] Chest: 2cm R kidney mass.   Mediastinal adenopathy    a. 12/208 CT Chest: up to 56m13mLL lung nodule. 1.5-1.7cm subcarinal/mediastinal adenopahty/right paratracheal lymph node; b. 11/2018 CT Chest: 2.5-3cm mediastinal adenopathy.   Mitral regurgitation    a. 04/2018 Cardiac MRI: sev MR; b. 05/2018 s/p mech MVR @ time of myomectomy; c. 07/2018 Echo: Mild MS.   Motion sickness    ocean  boats   Nausea    Nonobstructive coronary artery disease    a. 05/2018 Cath ([facility]): nonobs CAD.   Obstructive sleep apnea    CPAP started successfully 2014   Orthostatic hypotension    a. orthostatic. dehydration. hospitalized 11/11   Sleep apnea    Significant obstructive sleep apnea diagnosed in August, 2012, the patient is to receive CPAP    Stroke [facility])    "2 old strokes" CT and MRI. [place] [facility] 11/11. diagnosis was dehydration, no acutal reports.    TSH elevation    on synthroid historically    Unsteady gait    August, 2012   Past Surgical History:  Procedure Laterality Date   BLADDER SURGERY     CATARACT EXTRACTION W/PHACO Left 05/11/2015   Procedure: CATARACT EXTRACTION PHACO AND INTRAOCULAR LENS PLACEMENT ([place]);  Surgeon: Leandrew Koyanagi, MD;  Location: [place];  Service: Ophthalmology;  Laterality: Left;  DIABETIC - oral meds, CPAP    COLONOSCOPY WITH PROPOFOL N/A 04/14/2019   Procedure: COLONOSCOPY WITH PROPOFOL;  Surgeon: [name], MD;  Location: [facility] ENDOSCOPY;  Service: Endoscopy;  Laterality: N/A;   ESOPHAGOGASTRODUODENOSCOPY (EGD) WITH PROPOFOL N/A 07/01/2018   Procedure: ESOPHAGOGASTRODUODENOSCOPY (EGD) WITH PROPOFOL;  Surgeon: [name], MD;  Location: [facility] ENDOSCOPY;  Service: Endoscopy;  Laterality: N/A;   INSERT / REPLACE / [place]     "froze mass"   MECHANICAL AORTIC AND MITRAL VALVE REPLACEMENT  05/2018   [facility]    TONSILLECTOMY     VALVE REPLACEMENT     Patient Active Problem List   Diagnosis Date Noted   Other abnormalities of gait and mobility 11/15/2021   Depression 11/15/2021   Encounter for fitting and adjustment of hearing aid 11/15/2021   Abnormal CT scan    Abnormal findings on radiological examination of gastrointestinal tract    Polyp of colon    Cancer of right kidney ([place]) 12/10/2018   Pulmonary nodules 12/01/2018   Mediastinal lymphadenopathy 12/01/2018   Heart block AV third degree ([place]) 10/07/2018   Pacemaker 10/07/2018   H/O aortic valve replacement 10/07/2018   Blood in stool    Bleeding duodenal ulcer    GIB (gastrointestinal bleeding) 06/30/2018   Acute upper GI bleed    Decreased activities of daily living (ADL) 06/09/2018   Decreased strength, endurance, and mobility 06/09/2018   Pleural effusion, right 06/09/2018   S/P AVR (aortic valve replacement) 06/08/2018   H/O ventricular septal myectomy 06/08/2018   Chronic anticoagulation 06/02/2018   Heart murmur, systolic 16/07/9603   H/O: rheumatic fever 03/21/2017   Tinnitus 10/30/2016   On warfarin therapy 07/06/2015   Arteriosclerosis of coronary artery 06/13/2015   Carcinoma in situ of bladder 06/13/2015   Diabetes mellitus, type 2 ([place]) 06/13/2015   Diabetic neuropathy ([place]) 06/13/2015   Dizziness 06/13/2015   Essential (primary) hypertension 06/13/2015   Fatigue  06/13/2015   Glaucoma 06/13/2015   Hypercholesteremia 06/13/2015   Male hypogonadism 06/13/2015   Adult hypothyroidism 06/13/2015   Arthritis, degenerative 06/13/2015   CAP (community acquired pneumonia) 06/13/2015   Adenocarcinoma, renal cell ([place]) 06/13/2015   Benign head tremor 12/13/2014   Carotid arterial disease ([place]) 07/12/2014   Decreased cardiac output 07/12/2014   Cardiomyopathy, hypertrophic obstructive ([place]) 07/12/2014   Head injuries 07/12/2014   HOCM (hypertrophic obstructive cardiomyopathy) ([place])    Encounter for therapeutic drug monitoring 11/16/2013   Obstructive sleep apnea    Benign prostatic hypertrophy without urinary  obstruction 06/09/2012   History of neoplasm of bladder 06/09/2012   Head injury    Atherosclerosis of aorta (HCC)    Hypotension    Stroke (HCC)    Unsteady gait    Excessive sweating    Nausea    ORTHOSTATIC HYPOTENSION, HX OF 09/01/2010   Overweight 03/07/2009   MITRAL REGURGITATION 03/05/2009   Chronic atrial fibrillation ([place]) 03/05/2009   Atrial fibrillation and flutter ([place]) 03/05/2009   Mitral and aortic incompetence 03/05/2009      REFERRING DIAG: Imbalance  THERAPY DIAG:  Difficulty in walking, not elsewhere classified  Other abnormalities of gait and mobility  Unsteadiness on feet  Muscle weakness (generalized)  Rationale for Evaluation and Treatment Rehabilitation  PERTINENT HISTORY:  Pt report she has been having balance issues secondary to his ears. Pt was in the artilerary. Pt sold a home in april and has fallen 5 times since then. Pt tripped over a piece of wood in back yard and fell on one occassion. Pt has history of cardiac rehab [facility] at [facility]. Pt has a very difficult time with balance without his cane. He has difficulty with balance when he turns his head.   PRECAUTIONS: Fall  SUBJECTIVE: Pt doing well. Denies any new falls or changes since last visit. Ot stil having occassional LOB and caregiver reports he does  better in PT than he does at home.   Presents to therapy with his wife;  PAIN:  Are you having pain? No     TODAY'S TREATMENT: 06/21/22    Exercise/Activity Sets/Reps/Time/ Resistance Assistance Charge type Comments  Standing balance on airex with hip abduction, soccer ball located laterally and posteriorly for external cue  2 x 10 with 2 # AW  CGA  TE Cues for proper form   STS with airex pad and no UE support  2*8 reps with no UE assist  CGA, TA Improved form with no need for UE assist, cues for full upright stance and anterior weight shift.   Step taps anterior and lateral X 10 ea LE alternating with 1.5# AW  No UE TA No UE assist, good balnce response throughut, improved foot clearance this session, did get closer to step throuhgout and required cues for proper positioning of feet   Balance course: 1/2 foam, , 1/2 foam, red mat, step up walk short distance and step and off, airex pad, hurdle  X 2 laps without AD each direction  CGA   NMR 1 error with stepping on / off step trainer but after first error completes successfully on subsequent rounds. Increased challenge of imbalance course this session with good overall response   1/2 foam roller rocks  X 20  UE assist  TE To improve A/P balance reactions   Ambulaiton with 1.5# AW with intermittent retro ambulation and with cues for increasing speed  2 X 200 feet   TA Difficulty with transitions from forward to retro gait. Second bout no SPC with ambulation, slower pace but no LOB noted, no retro gait on second round.   Treatment Provided this session   Pt educated throughout session about proper posture and technique with exercises. Improved exercise technique, movement at target joints, use of target muscles after min to mod verbal, visual, tactile cues. Note: Portions of this document were prepared using Dragon voice recognition software and although reviewed may contain unintentional dictation errors in syntax, grammar, or  spelling.         PATIENT EDUCATION: Education details: Pt given verbal  cuing on proper demonstration of technique during performance of exercises and as part of HEP. Person educated: Patient Education method: Explanation, Demonstration, Tactile cues, and Verbal cues Education comprehension: verbalized understanding, returned demonstration, and verbal cues required   HOME EXERCISE PROGRAM:  Access Code: [email or web address] URL: [email or web address] Date: 04/26/2022 Prepared by: Robinette Nation  Exercises - Standing March with Counter Support  - 1 x daily - 7 x weekly - 3 sets - 10 reps - Standing Knee Flexion with Unilateral Counter Support  - 1 x daily - 7 x weekly - 3 sets - 10 reps - Heel Toe Raises with Unilateral Counter Support  - 1 x daily - 7 x weekly - 3 sets - 10 reps - Standing Hip Abduction with Unilateral Counter Support  - 1 x daily - 7 x weekly - 3 sets - 10 reps - Standing Hip Extension with Unilateral Counter Support  - 1 x daily - 7 x weekly - 3 sets - 10 reps   PT Short Term Goals        PT SHORT TERM GOAL #1   Title Patient will be independent in home exercise program to improve strength/mobility for better functional independence with ADLs.    Baseline no HEP 8/15: Not completing regularly, lots of discussion about importance of comliance    Time 4    Period Weeks    Status Progressing    Target Date 04/16/22              PT Long Term Goals       PT LONG TERM GOAL #1   Title Patient will increase FOTO score to equal to or greater than   48  to demonstrate statistically significant improvement in mobility and quality of life.    Baseline 42    Time 8    Period Weeks    Status Met    Target Date 06/11/22      PT LONG TERM GOAL #2   Title Patient  will complete five times sit to stand test in < 17 seconds indicating an increased LE strength and improved balance.    Baseline 23.53 sec 8/15: 17 sec heavy UE use    Time 12    Period Weeks     Status Ongoing     Target Date 06/11/22      PT LONG TERM GOAL #3   Title Patient will increase Berg Balance score by > 6 points to demonstrate decreased fall risk during functional activities.    Baseline 34,  8/15:38   Time 12    Period Weeks    Status Partially met       PT LONG TERM GOAL #4   Title Patient will increase six minute walk test distance to >1000 for progression to community ambulator and improve gait ability    Baseline 725 feet    Time 12    Period Weeks    Status Partially met     Target Date 06/11/22      PT LONG TERM GOAL #5   Title Patient will increase 10 meter walk test to >1.38ms as to improve gait speed for better community ambulation and to reduce fall risk.    Baseline .58 m/s   Time 12    Period Weeks    Status Ongoing     Target Date 06/11/22                    Plan  Clinical Impression Statement Continued with current plan of care as laid out in evaluation and recent prior sessions. Pt remains motivated to advance progress toward goals in order to maximize independence and safety at home. Pt requires high level assistance and cuing for completion of exercises in order to provide adequate level of stimulation and perturbation. Author allows pt as much opportunity as possible to perform independent righting strategies, only stepping in when pt is unable to prevent falling to floor.  Patient shows good progress with ambulation without assistive device this session indicating improvement in motor control as well as improved confidence with activities. Pt also showing progress with ambulation with obstacles and changing surfaces indicating improvement in ambulation safety without AD and STS without UE but still has airex under .   Pt closely monitored throughout session for safe vitals response and to maximize patient safety during interventions. Pt continues to demonstrate progress toward goals AEB progression of some interventions this date  either in volume or intensity.         Personal Factors and Comorbidities Age;Comorbidity 1;Comorbidity 2;Comorbidity 3+    Comorbidities CAD, Peripheral neuropathy, T2DM, HTN,    Examination-Activity Limitations Caring for Others;Carry;Locomotion Level;Stairs;Stand    Examination-Participation Restrictions Yard Work;Shop;Community Activity    Stability/Clinical Decision Making Evolving/Moderate complexity    Rehab Potential Fair    PT Frequency 2x / week    PT Duration 12 weeks    PT Treatment/Interventions ADLs/Self Care Home Management;Moist Heat;DME Instruction;Stair training;Functional mobility training;Therapeutic activities;Therapeutic exercise;Balance training;Neuromuscular re-education;Patient/family education;Wheelchair mobility training;Manual techniques;Dry needling;Energy conservation    PT Next Visit Plan test 6MWT and 10MWT, also establish HEP for 2 weeks pt will be at [place] Provided visit 2    Consulted and Agree with Plan of Care Patient;Family member/caregiver                Particia Lather PT, DPT  06/21/22 11:39 AM   06/21/22, 11:39 AM

## 2022-06-26 ENCOUNTER — Ambulatory Visit: Payer: PPO | Admitting: Physical Therapy

## 2022-06-26 DIAGNOSIS — R262 Difficulty in walking, not elsewhere classified: Secondary | ICD-10-CM | POA: Diagnosis not present

## 2022-06-26 DIAGNOSIS — R2681 Unsteadiness on feet: Secondary | ICD-10-CM

## 2022-06-26 DIAGNOSIS — R2689 Other abnormalities of gait and mobility: Secondary | ICD-10-CM

## 2022-06-26 DIAGNOSIS — M6281 Muscle weakness (generalized): Secondary | ICD-10-CM

## 2022-06-26 NOTE — Therapy (Signed)
OUTPATIENT PHYSICAL THERAPY treatment note    Patient Name: [name] [name] MRN: [id number] DOB:1934/05/04, 88 y.o., male Today's Date: 06/26/2022  PCP: [name], MD REFERRING PROVIDER: [name], MD   PT End of Session - 06/26/22 1103     Visit Number 18    Number of Visits 24    Date for PT Re-Evaluation 09/04/22    Authorization Type [employer]    Authorization Time Period -06/11/22    Progress Note Due on Visit 20    PT Start Time 1100    PT Stop Time 1145    PT Time Calculation (min) 45 min    Equipment Utilized During Treatment Gait belt    Activity Tolerance Patient tolerated treatment well    Behavior During Therapy [facility] for tasks assessed/performed                    Past Medical History:  Diagnosis Date   Aortic stenosis    a. 01/2016 echo: mod AS; b. 04/2018 Cardiac MRI: sev AS; c. 05/2018 s/p mech AVR @ time of myomectomy; d. 07/2018 Echo: Mild AS/AI.   Atherosclerosis of aorta ([place])    by CT scan in past   Atrial fibrillation (HCC)    and aflutter. pt has a left atrial circuit that is not ablated. was on amiodarone-stopped, now use rate control.    Bladder cancer ([place])    hx; treated with BCG in past    Carotid artery disease ([place])    There was calcified plaque but no stenosis by carotid artery screening done at [facility] October, 2013   CHB (complete heart block) [facility])    a. 05/2018 s/p BSX VVIR PPM ([facility]) in setting of bradycardia following myomectomy and AVR/MVR.   Diabetes mellitus    type not specified   Diabetic neuropathy ([place])    feet   Elevated liver enzymes    over time; hx   Excessive sweating    Glaucoma    Gout    Head injury    when slipped on ice 2004-2005. stabilized and back on Coumadin    Headache    migraines - distant past   History of cardiac cath    a. 05/2018 Cath ([facility]): Non-obstructive CAD. Mildly elevated filling pressures w/ nl CI.   HOCM (hypertrophic obstructive  cardiomyopathy) ([place])    a. 01/2016 Echo: EF 65-70%, no rwma, LVOT gradient of 80-61mHg, mod AS, SAM; b. 11/2017 Echo: EF 55-60%, near cavity obliteration in systole, mod AS, mild MS; c. 04/2018 Card MRI ([facility]): Sev LVH, EF >70%, Sev AS, LVOT obs w/ Sev MR, mild to mod TR/PR, mid-myocardial HE in basal-mid anteroseptum and inferoseptum; d. 05/2018 s/p Septal myomectomy; e. 07/2018 Echo: EF 60-65%. PASP 385mg.   Homocystinemia ([place]   elevated, mild    Hypercholesterolemia    treated.    Hypertension    Infection of right inner ear    Kidney mass    a. s/p laproscopic surgery woth cryoablation of a [facility] outside [facility]: 2cm R kidney mass.   Mediastinal adenopathy    a. 12/208 CT Chest: up to 63m38mLL lung nodule. 1.5-1.7cm subcarinal/mediastinal adenopahty/right paratracheal lymph node; b. 11/2018 CT Chest: 2.5-3cm mediastinal adenopathy.   Mitral regurgitation    a. 04/2018 Cardiac MRI: sev MR; b. 05/2018 s/p mech MVR @ time of myomectomy; c. 07/2018 Echo: Mild MS.   Motion sickness  ocean boats   Nausea    Nonobstructive coronary artery disease    a. 05/2018 Cath ([facility]): nonobs CAD.   Obstructive sleep apnea    CPAP started successfully 2014   Orthostatic hypotension    a. orthostatic. dehydration. hospitalized 11/11   Sleep apnea    Significant obstructive sleep apnea diagnosed in August, 2012, the patient is to receive CPAP    [facility]    "2 old strokes" CT and MRI. [facility] 11/11. diagnosis was dehydration, no acutal reports.    TSH elevation    on synthroid historically    Unsteady gait    August, 2012   Past Surgical History:  Procedure Laterality Date   BLADDER SURGERY     CATARACT EXTRACTION W/PHACO Left 05/11/2015   Procedure: CATARACT EXTRACTION PHACO AND INTRAOCULAR LENS PLACEMENT ([place]);  Surgeon: Leandrew Koyanagi, MD;  Location: [place];  Service: Ophthalmology;  Laterality: Left;  DIABETIC - oral meds, CPAP    COLONOSCOPY WITH PROPOFOL N/A 04/14/2019   Procedure: COLONOSCOPY WITH PROPOFOL;  Surgeon: [name], MD;  Location: [facility] ENDOSCOPY;  Service: Endoscopy;  Laterality: N/A;   ESOPHAGOGASTRODUODENOSCOPY (EGD) WITH PROPOFOL N/A 07/01/2018   Procedure: ESOPHAGOGASTRODUODENOSCOPY (EGD) WITH PROPOFOL;  Surgeon: [name], MD;  Location: [facility] ENDOSCOPY;  Service: Endoscopy;  Laterality: N/A;   INSERT / REPLACE / [place]     "froze mass"   MECHANICAL AORTIC AND MITRAL VALVE REPLACEMENT  05/2018   [facility]    TONSILLECTOMY     VALVE REPLACEMENT     Patient Active Problem List   Diagnosis Date Noted   Other abnormalities of gait and mobility 11/15/2021   Depression 11/15/2021   Encounter for fitting and adjustment of hearing aid 11/15/2021   Abnormal CT scan    Abnormal findings on radiological examination of gastrointestinal tract    Polyp of colon    Cancer of right kidney ([place]) 12/10/2018   Pulmonary nodules 12/01/2018   Mediastinal lymphadenopathy 12/01/2018   Heart block AV third degree ([place]) 10/07/2018   Pacemaker 10/07/2018   H/O aortic valve replacement 10/07/2018   Blood in stool    Bleeding duodenal ulcer    GIB (gastrointestinal bleeding) 06/30/2018   Acute upper GI bleed    Decreased activities of daily living (ADL) 06/09/2018   Decreased strength, endurance, and mobility 06/09/2018   Pleural effusion, right 06/09/2018   S/P AVR (aortic valve replacement) 06/08/2018   H/O ventricular septal myectomy 06/08/2018   Chronic anticoagulation 06/02/2018   Heart murmur, systolic 76/19/5093   H/O: rheumatic fever 03/21/2017   Tinnitus 10/30/2016   On warfarin therapy 07/06/2015   Arteriosclerosis of coronary artery 06/13/2015   Carcinoma in situ of bladder 06/13/2015   Diabetes mellitus, type 2 ([place]) 06/13/2015   Diabetic neuropathy ([address]) 06/13/2015   Dizziness 06/13/2015   Essential (primary) hypertension 06/13/2015   Fatigue  06/13/2015   Glaucoma 06/13/2015   Hypercholesteremia 06/13/2015   Male hypogonadism 06/13/2015   Adult hypothyroidism 06/13/2015   Arthritis, degenerative 06/13/2015   CAP (community acquired pneumonia) 06/13/2015   Adenocarcinoma, renal cell ([place]) 06/13/2015   Benign head tremor 12/13/2014   Carotid arterial disease ([place]) 07/12/2014   Decreased cardiac output 07/12/2014   Cardiomyopathy, hypertrophic obstructive ([place]) 07/12/2014   Head injuries 07/12/2014   HOCM (hypertrophic obstructive cardiomyopathy) ([place])    Encounter for therapeutic drug monitoring 11/16/2013   Obstructive sleep apnea    Benign prostatic hypertrophy without  urinary obstruction 06/09/2012   History of neoplasm of bladder 06/09/2012   Head injury    Atherosclerosis of aorta (HCC)    Hypotension    Stroke (HCC)    Unsteady gait    Excessive sweating    Nausea    ORTHOSTATIC HYPOTENSION, HX OF 09/01/2010   Overweight 03/07/2009   MITRAL REGURGITATION 03/05/2009   Chronic atrial fibrillation ([place]) 03/05/2009   Atrial fibrillation and flutter ([place]) 03/05/2009   Mitral and aortic incompetence 03/05/2009      REFERRING DIAG: Imbalance  THERAPY DIAG:  Difficulty in walking, not elsewhere classified  Other abnormalities of gait and mobility  Unsteadiness on feet  Muscle weakness (generalized)  Rationale for Evaluation and Treatment Rehabilitation  PERTINENT HISTORY:  Pt report she has been having balance issues secondary to his ears. Pt was in the artilerary. Pt sold a home in april and has fallen 5 times since then. Pt tripped over a piece of wood in back yard and fell on one occassion. Pt has history of cardiac rehab [facility] at [facility]. Pt has a very difficult time with balance without his cane. He has difficulty with balance when he turns his head.   PRECAUTIONS: Fall  SUBJECTIVE: Pt doing well. Denies any new falls or changes since last visit. Ot stil having occassional LOB and caregiver reports he does  better in PT than he does at home.   Presents to therapy with his wife;  PAIN:  Are you having pain? No     TODAY'S TREATMENT: 06/26/22    Exercise/Activity Sets/Reps/Time/ Resistance Assistance Charge type Comments  Standing balance on airex with hip abduction, soccer ball located laterally and posteriorly for external cue  2 x 10 with 2 # AW  CGA  TE Cues for proper form   Single leg balance practice kicking soccer ball at cones.  3 rounds x 4-6 kicks each  CGA     STS with airex pad and no UE support  1*10 reps with no UE assist  CGA, TA Improved form with no need for UE assist, cues for full upright stance and anterior weight shift.   Step taps anterior and lateral X 10 ea LE alternating with 1.5# AW  No UE TA No UE assist, good balnce response throughut, improved foot clearance this session, did get closer to step throuhgout and required cues for proper positioning of feet   Balance challenge: dribbling soccer ball around obstacle sin clinic X 3 min with various turns to either side  CGA   NMR 1 error dribbling where balance had to be corrected via min A from PT, otherwise pt completes well with good control but did report was challenging.   Rocker board rocks a/p  X 20  UE assist  TE To improve A/P balance reactions   Ambulaiton with 1.5# AW with intermittent retro ambulation and with cues for increasing speed  1 X 300 feet  1*200 feet with intermittent retro ambulation   TA Difficulty with transitions from forward to retro gait. Second bout no SPC with ambulation, slower pace but no LOB noted, no retro gait on second round.   Treatment Provided this session   Pt educated throughout session about proper posture and technique with exercises. Improved exercise technique, movement at target joints, use of target muscles after min to mod verbal, visual, tactile cues. Note: Portions of this document were prepared using Dragon voice recognition software and although reviewed may contain  unintentional dictation errors in syntax, grammar, or  spelling.         PATIENT EDUCATION: Education details: Pt given verbal cuing on proper demonstration of technique during performance of exercises and as part of HEP. Person educated: Patient Education method: Explanation, Demonstration, Tactile cues, and Verbal cues Education comprehension: verbalized understanding, returned demonstration, and verbal cues required   HOME EXERCISE PROGRAM:  Access Code: [email or web address] URL: [email or web address] Date: 04/26/2022 Prepared by: [place]  Exercises - Standing March with Counter Support  - 1 x daily - 7 x weekly - 3 sets - 10 reps - Standing Knee Flexion with Unilateral Counter Support  - 1 x daily - 7 x weekly - 3 sets - 10 reps - Heel Toe Raises with Unilateral Counter Support  - 1 x daily - 7 x weekly - 3 sets - 10 reps - Standing Hip Abduction with Unilateral Counter Support  - 1 x daily - 7 x weekly - 3 sets - 10 reps - Standing Hip Extension with Unilateral Counter Support  - 1 x daily - 7 x weekly - 3 sets - 10 reps   PT Short Term Goals        PT SHORT TERM GOAL #1   Title Patient will be independent in home exercise program to improve strength/mobility for better functional independence with ADLs.    Baseline no HEP 8/15: Not completing regularly, lots of discussion about importance of comliance    Time 4    Period Weeks    Status Progressing    Target Date 04/16/22              PT Long Term Goals       PT LONG TERM GOAL #1   Title Patient will increase FOTO score to equal to or greater than   48  to demonstrate statistically significant improvement in mobility and quality of life.    Baseline 42    Time 8    Period Weeks    Status Met    Target Date 06/11/22      PT LONG TERM GOAL #2   Title Patient  will complete five times sit to stand test in < 17 seconds indicating an increased LE strength and improved balance.    Baseline 23.53 sec  8/15: 17 sec heavy UE use    Time 12    Period Weeks    Status Ongoing     Target Date 06/11/22      PT LONG TERM GOAL #3   Title Patient will increase Berg Balance score by > 6 points to demonstrate decreased fall risk during functional activities.    Baseline 34,  8/15:38   Time 12    Period Weeks    Status Partially met       PT LONG TERM GOAL #4   Title Patient will increase six minute walk test distance to >1000 for progression to community ambulator and improve gait ability    Baseline 725 feet    Time 12    Period Weeks    Status Partially met     Target Date 06/11/22      PT LONG TERM GOAL #5   Title Patient will increase 10 meter walk test to >1.96ms as to improve gait speed for better community ambulation and to reduce fall risk.    Baseline .58 m/s   Time 12    Period Weeks    Status Ongoing     Target Date 06/11/22  Plan     Clinical Impression Statement Continued with current plan of care as laid out in evaluation and recent prior sessions. Pt remains motivated to advance progress toward goals in order to maximize independence and safety at home. Pt requires high level assistance and cuing for completion of exercises in order to provide adequate level of stimulation and perturbation. Author allows pt as much opportunity as possible to perform independent righting strategies, only stepping in when pt is unable to prevent falling to floor.  Patient shows good progress with ambulation without assistive device this session even with dual task motor and with retro ambulation. Pt closely monitored throughout session for safe vitals response and to maximize patient safety during interventions. Pt continues to demonstrate progress toward goals AEB progression of some interventions this date either in volume or intensity.         Personal Factors and Comorbidities Age;Comorbidity 1;Comorbidity 2;Comorbidity 3+    Comorbidities CAD, Peripheral  neuropathy, T2DM, HTN,    Examination-Activity Limitations Caring for Others;Carry;Locomotion Level;Stairs;Stand    Examination-Participation Restrictions Yard Work;Shop;Community Activity    Stability/Clinical Decision Making Evolving/Moderate complexity    Rehab Potential Fair    PT Frequency 2x / week    PT Duration 12 weeks    PT Treatment/Interventions ADLs/Self Care Home Management;Moist Heat;DME Instruction;Stair training;Functional mobility training;Therapeutic activities;Therapeutic exercise;Balance training;Neuromuscular re-education;Patient/family education;Wheelchair mobility training;Manual techniques;Dry needling;Energy conservation    PT Next Visit Plan test 6MWT and 10MWT, also establish HEP for 2 weeks pt will be at [place] Provided visit 2    Consulted and Agree with Plan of Care Patient;Family member/caregiver                Particia Lather PT, DPT  06/26/22 4:58 PM   06/26/22, 4:58 PM

## 2022-06-27 ENCOUNTER — Ambulatory Visit: Payer: PPO | Attending: Cardiovascular Disease

## 2022-06-27 DIAGNOSIS — Z5181 Encounter for therapeutic drug level monitoring: Secondary | ICD-10-CM

## 2022-06-27 DIAGNOSIS — I4892 Unspecified atrial flutter: Secondary | ICD-10-CM | POA: Diagnosis not present

## 2022-06-27 DIAGNOSIS — I4891 Unspecified atrial fibrillation: Secondary | ICD-10-CM

## 2022-06-27 DIAGNOSIS — I482 Chronic atrial fibrillation, unspecified: Secondary | ICD-10-CM

## 2022-06-27 LAB — POCT INR: INR: 3.5 — AB (ref 2.0–3.0)

## 2022-06-27 NOTE — Patient Instructions (Signed)
Continue 1.5 tablets every day EXCEPT 1 tablet on MONDAYS & FRIDAYS.  Recheck INR in 2 weeks.

## 2022-06-28 ENCOUNTER — Ambulatory Visit: Payer: PPO | Admitting: Physical Therapy

## 2022-06-28 ENCOUNTER — Encounter: Payer: Self-pay | Admitting: Physical Therapy

## 2022-06-28 DIAGNOSIS — R262 Difficulty in walking, not elsewhere classified: Secondary | ICD-10-CM | POA: Diagnosis not present

## 2022-06-28 DIAGNOSIS — R2689 Other abnormalities of gait and mobility: Secondary | ICD-10-CM

## 2022-06-28 DIAGNOSIS — R2681 Unsteadiness on feet: Secondary | ICD-10-CM

## 2022-06-28 DIAGNOSIS — M6281 Muscle weakness (generalized): Secondary | ICD-10-CM

## 2022-06-28 NOTE — Therapy (Signed)
OUTPATIENT PHYSICAL THERAPY treatment note    Patient Name: [name] [name] MRN: [id number] DOB:1934-02-24, 88 y.o., male Today's Date: 06/28/2022  PCP: [name], MD REFERRING PROVIDER: [name], MD   PT End of Session - 06/28/22 1104     Visit Number 19    Number of Visits 24    Date for PT Re-Evaluation 09/04/22    Authorization Type [employer]    Authorization Time Period -06/11/22    Progress Note Due on Visit 20    PT Start Time 1102    PT Stop Time 1144    PT Time Calculation (min) 42 min    Equipment Utilized During Treatment Gait belt    Activity Tolerance Patient tolerated treatment well    Behavior During Therapy [facility] for tasks assessed/performed                     Past Medical History:  Diagnosis Date   Aortic stenosis    a. 01/2016 echo: mod AS; b. 04/2018 Cardiac MRI: sev AS; c. 05/2018 s/p mech AVR @ time of myomectomy; d. 07/2018 Echo: Mild AS/AI.   Atherosclerosis of aorta ([place])    by CT scan in past   Atrial fibrillation (HCC)    and aflutter. pt has a left atrial circuit that is not ablated. was on amiodarone-stopped, now use rate control.    Bladder cancer ([place])    hx; treated with BCG in past    Carotid artery disease ([place])    There was calcified plaque but no stenosis by carotid artery screening done at [facility] October, 2013   CHB (complete heart block) [facility])    a. 05/2018 s/p BSX VVIR PPM ([facility]) in setting of bradycardia following myomectomy and AVR/MVR.   Diabetes mellitus    type not specified   Diabetic neuropathy ([place])    feet   Elevated liver enzymes    over time; hx   Excessive sweating    Glaucoma    Gout    Head injury    when slipped on ice 2004-2005. stabilized and back on Coumadin    Headache    migraines - distant past   History of cardiac cath    a. 05/2018 Cath ([facility]): Non-obstructive CAD. Mildly elevated filling pressures w/ nl CI.   HOCM (hypertrophic obstructive  cardiomyopathy) ([place])    a. 01/2016 Echo: EF 65-70%, no rwma, LVOT gradient of 80-65mHg, mod AS, SAM; b. 11/2017 Echo: EF 55-60%, near cavity obliteration in systole, mod AS, mild MS; c. 04/2018 Card MRI ([facility]): Sev LVH, EF >70%, Sev AS, LVOT obs w/ Sev MR, mild to mod TR/PR, mid-myocardial HE in basal-mid anteroseptum and inferoseptum; d. 05/2018 s/p Septal myomectomy; e. 07/2018 Echo: EF 60-65%. PASP 370mg.   Homocystinemia ([place]   elevated, mild    Hypercholesterolemia    treated.    Hypertension    Infection of right inner ear    Kidney mass    a. s/p laproscopic surgery woth cryoablation of a mass outside [facility] Chest: 2cm R kidney mass.   Mediastinal adenopathy    a. 12/208 CT Chest: up to 83m8mLL lung nodule. 1.5-1.7cm subcarinal/mediastinal adenopahty/right paratracheal lymph node; b. 11/2018 CT Chest: 2.5-3cm mediastinal adenopathy.   Mitral regurgitation    a. 04/2018 Cardiac MRI: sev MR; b. 05/2018 s/p mech MVR @ time of myomectomy; c. 07/2018 Echo: Mild MS.   Motion sickness  ocean boats   Nausea    Nonobstructive coronary artery disease    a. 05/2018 Cath ([facility]): nonobs CAD.   Obstructive sleep apnea    CPAP started successfully 2014   Orthostatic hypotension    a. orthostatic. dehydration. hospitalized 11/11   Sleep apnea    Significant obstructive sleep apnea diagnosed in August, 2012, the patient is to receive CPAP    [facility]    "2 old strokes" CT and MRI. [place] [facility] 11/11. diagnosis was dehydration, no acutal reports.    TSH elevation    on synthroid historically    Unsteady gait    August, 2012   Past Surgical History:  Procedure Laterality Date   BLADDER SURGERY     CATARACT EXTRACTION W/PHACO Left 05/11/2015   Procedure: CATARACT EXTRACTION PHACO AND INTRAOCULAR LENS PLACEMENT ([place]);  Surgeon: Leandrew Koyanagi, MD;  Location: [place];  Service: Ophthalmology;  Laterality: Left;  DIABETIC - oral meds, CPAP    COLONOSCOPY WITH PROPOFOL N/A 04/14/2019   Procedure: COLONOSCOPY WITH PROPOFOL;  Surgeon: [name], MD;  Location: [facility] ENDOSCOPY;  Service: Endoscopy;  Laterality: N/A;   ESOPHAGOGASTRODUODENOSCOPY (EGD) WITH PROPOFOL N/A 07/01/2018   Procedure: ESOPHAGOGASTRODUODENOSCOPY (EGD) WITH PROPOFOL;  Surgeon: [name], MD;  Location: [facility] ENDOSCOPY;  Service: Endoscopy;  Laterality: N/A;   INSERT / REPLACE / [place]     "froze mass"   MECHANICAL AORTIC AND MITRAL VALVE REPLACEMENT  05/2018   [facility]    TONSILLECTOMY     VALVE REPLACEMENT     Patient Active Problem List   Diagnosis Date Noted   Other abnormalities of gait and mobility 11/15/2021   Depression 11/15/2021   Encounter for fitting and adjustment of hearing aid 11/15/2021   Abnormal CT scan    Abnormal findings on radiological examination of gastrointestinal tract    Polyp of colon    Cancer of right kidney ([place]) 12/10/2018   Pulmonary nodules 12/01/2018   Mediastinal lymphadenopathy 12/01/2018   Heart block AV third degree ([place]) 10/07/2018   Pacemaker 10/07/2018   H/O aortic valve replacement 10/07/2018   Blood in stool    Bleeding duodenal ulcer    GIB (gastrointestinal bleeding) 06/30/2018   Acute upper GI bleed    Decreased activities of daily living (ADL) 06/09/2018   Decreased strength, endurance, and mobility 06/09/2018   Pleural effusion, right 06/09/2018   S/P AVR (aortic valve replacement) 06/08/2018   H/O ventricular septal myectomy 06/08/2018   Chronic anticoagulation 06/02/2018   Heart murmur, systolic 76/19/5093   H/O: rheumatic fever 03/21/2017   Tinnitus 10/30/2016   On warfarin therapy 07/06/2015   Arteriosclerosis of coronary artery 06/13/2015   Carcinoma in situ of bladder 06/13/2015   Diabetes mellitus, type 2 ([place]) 06/13/2015   Diabetic neuropathy ([address]) 06/13/2015   Dizziness 06/13/2015   Essential (primary) hypertension 06/13/2015   Fatigue  06/13/2015   Glaucoma 06/13/2015   Hypercholesteremia 06/13/2015   Male hypogonadism 06/13/2015   Adult hypothyroidism 06/13/2015   Arthritis, degenerative 06/13/2015   CAP (community acquired pneumonia) 06/13/2015   Adenocarcinoma, renal cell ([place]) 06/13/2015   Benign head tremor 12/13/2014   Carotid arterial disease ([place]) 07/12/2014   Decreased cardiac output 07/12/2014   Cardiomyopathy, hypertrophic obstructive ([place]) 07/12/2014   Head injuries 07/12/2014   HOCM (hypertrophic obstructive cardiomyopathy) ([place])    Encounter for therapeutic drug monitoring 11/16/2013   Obstructive sleep apnea    Benign prostatic hypertrophy without  urinary obstruction 06/09/2012   History of neoplasm of bladder 06/09/2012   Head injury    Atherosclerosis of aorta (HCC)    Hypotension    Stroke (HCC)    Unsteady gait    Excessive sweating    Nausea    ORTHOSTATIC HYPOTENSION, HX OF 09/01/2010   Overweight 03/07/2009   MITRAL REGURGITATION 03/05/2009   Chronic atrial fibrillation ([place]) 03/05/2009   Atrial fibrillation and flutter ([place]) 03/05/2009   Mitral and aortic incompetence 03/05/2009      REFERRING DIAG: Imbalance  THERAPY DIAG:  Difficulty in walking, not elsewhere classified  Other abnormalities of gait and mobility  Unsteadiness on feet  Muscle weakness (generalized)  Rationale for Evaluation and Treatment Rehabilitation  PERTINENT HISTORY:  Pt report she has been having balance issues secondary to his ears. Pt was in the artilerary. Pt sold a home in april and has fallen 5 times since then. Pt tripped over a piece of wood in back yard and fell on one occassion. Pt has history of cardiac rehab [facility] at [facility]. Pt has a very difficult time with balance without his cane. He has difficulty with balance when he turns his head.   PRECAUTIONS: Fall  SUBJECTIVE: Pt doing well. Denies any new falls or changes since last visit.   Presents to therapy with his wife; Tiffany Kocher  PAIN:   Are you having pain? No     TODAY'S TREATMENT: 06/28/22    Exercise/Activity Sets/Reps/Time/ Resistance Assistance Charge type Comments  Standing balance on airex with hip abduction, soccer ball located laterally and posteriorly for external cue  2 x 10 with 2 # AW  CGA  TE Cues for proper form   Soccer ball penalty kicks then retrieving ball without UE and setting up kick again to opposite side x 2 X 10 intermittently changing LE for kicking, cues to challenge with L LE kicks  CGA  NMR Working on single leg baalance and LE coordination and balance throughout   STS with airex pad and no UE support  1*8 reps with no UE assist  CGA, TA Improved form with no need for UE assist, cues for full upright stance and anterior weight shift.       No UE assist, good balnce response throughut, improved foot clearance this session, did get closer to step throuhgout and required cues for proper positioning of feet         Rocker board rocks a/p  X 20  UE assist  TE To improve A/P balance reactions   Ambulaiton with 1.5# AW with intermittent retro ambulation and with cues for increasing speed  1 X 300 feet  1*200 feet with intermittent retro ambulation  No AD  TA 2 LOB with right turns due to narrow stepping and losing balance on first bout, on second bout pt challenged with color coated tiles and external focus to keep feet apart.   Treatment Provided this session   Pt educated throughout session about proper posture and technique with exercises. Improved exercise technique, movement at target joints, use of target muscles after min to mod verbal, visual, tactile cues. Note: Portions of this document were prepared using Dragon voice recognition software and although reviewed may contain unintentional dictation errors in syntax, grammar, or spelling.         PATIENT EDUCATION: Education details: Pt given verbal cuing on proper demonstration of technique during performance of exercises and as part of  HEP. Person educated: Patient Education method: Explanation, Demonstration, Tactile cues,  and Verbal cues Education comprehension: verbalized understanding, returned demonstration, and verbal cues required   HOME EXERCISE PROGRAM:  Access Code: [place]: [email or web address] Date: 04/26/2022 Prepared by: [place]  Exercises - Standing March with Counter Support  - 1 x daily - 7 x weekly - 3 sets - 10 reps - Standing Knee Flexion with Unilateral Counter Support  - 1 x daily - 7 x weekly - 3 sets - 10 reps - Heel Toe Raises with Unilateral Counter Support  - 1 x daily - 7 x weekly - 3 sets - 10 reps - Standing Hip Abduction with Unilateral Counter Support  - 1 x daily - 7 x weekly - 3 sets - 10 reps - Standing Hip Extension with Unilateral Counter Support  - 1 x daily - 7 x weekly - 3 sets - 10 reps   PT Short Term Goals        PT SHORT TERM GOAL #1   Title Patient will be independent in home exercise program to improve strength/mobility for better functional independence with ADLs.    Baseline no HEP 8/15: Not completing regularly, lots of discussion about importance of comliance    Time 4    Period Weeks    Status Progressing    Target Date 04/16/22              PT Long Term Goals       PT LONG TERM GOAL #1   Title Patient will increase FOTO score to equal to or greater than   48  to demonstrate statistically significant improvement in mobility and quality of life.    Baseline 42    Time 8    Period Weeks    Status Met    Target Date 06/11/22      PT LONG TERM GOAL #2   Title Patient  will complete five times sit to stand test in < 17 seconds indicating an increased LE strength and improved balance.    Baseline 23.53 sec 8/15: 17 sec heavy UE use    Time 12    Period Weeks    Status Ongoing     Target Date 06/11/22      PT LONG TERM GOAL #3   Title Patient will increase Berg Balance score by > 6 points to demonstrate decreased fall risk  during functional activities.    Baseline 34,  8/15:38   Time 12    Period Weeks    Status Partially met       PT LONG TERM GOAL #4   Title Patient will increase six minute walk test distance to >1000 for progression to community ambulator and improve gait ability    Baseline 725 feet    Time 12    Period Weeks    Status Partially met     Target Date 06/11/22      PT LONG TERM GOAL #5   Title Patient will increase 10 meter walk test to >1.31ms as to improve gait speed for better community ambulation and to reduce fall risk.    Baseline .58 m/s   Time 12    Period Weeks    Status Ongoing     Target Date 06/11/22                    Plan     Clinical Impression Statement Continued with current plan of care as laid out in evaluation and recent prior sessions. Pt remains motivated to advance progress  toward goals in order to maximize independence and safety at home. Pt requires high level assistance and cuing for completion of exercises in order to provide adequate level of stimulation and perturbation. Author allows pt as much opportunity as possible to perform independent righting strategies, only stepping in when pt is unable to prevent falling to floor.  Patient shows good progress with ambulation but did require cues to prevent NBOS when ambulating. Pt closely monitored throughout session for safe vitals response and to maximize patient safety during interventions. Pt continues to demonstrate progress toward goals AEB progression of some interventions this date either in volume or intensity.         Personal Factors and Comorbidities Age;Comorbidity 1;Comorbidity 2;Comorbidity 3+    Comorbidities CAD, Peripheral neuropathy, T2DM, HTN,    Examination-Activity Limitations Caring for Others;Carry;Locomotion Level;Stairs;Stand    Examination-Participation Restrictions Yard Work;Shop;Community Activity    Stability/Clinical Decision Making Evolving/Moderate complexity     Rehab Potential Fair    PT Frequency 2x / week    PT Duration 12 weeks    PT Treatment/Interventions ADLs/Self Care Home Management;Moist Heat;DME Instruction;Stair training;Functional mobility training;Therapeutic activities;Therapeutic exercise;Balance training;Neuromuscular re-education;Patient/family education;Wheelchair mobility training;Manual techniques;Dry needling;Energy conservation    PT Next Visit Plan test 6MWT and 10MWT, also establish HEP for 2 weeks pt will be at [place] Provided visit 2    Consulted and Agree with Plan of Care Patient;Family member/caregiver                Particia Lather PT, DPT  06/28/22 11:05 AM   06/28/22, 11:05 AM

## 2022-07-03 ENCOUNTER — Ambulatory Visit: Payer: PPO | Admitting: Physical Therapy

## 2022-07-03 DIAGNOSIS — R262 Difficulty in walking, not elsewhere classified: Secondary | ICD-10-CM | POA: Diagnosis not present

## 2022-07-03 DIAGNOSIS — M6281 Muscle weakness (generalized): Secondary | ICD-10-CM

## 2022-07-03 DIAGNOSIS — R2681 Unsteadiness on feet: Secondary | ICD-10-CM

## 2022-07-03 DIAGNOSIS — R2689 Other abnormalities of gait and mobility: Secondary | ICD-10-CM

## 2022-07-03 NOTE — Therapy (Signed)
OUTPATIENT PHYSICAL THERAPY treatment note Physical Therapy Progress Note   Dates of reporting period  05/31/22   to   07/03/22    Patient Name: [name] [name] MRN: [id number] DOB:Dec 01, 1933, 88 y.o., male Today's Date: 07/03/2022  PCP: [name], MD REFERRING PROVIDER: [name], MD   PT End of Session - 07/03/22 1119     Visit Number 20    Number of Visits 24    Date for PT Re-Evaluation 09/04/22    Authorization Type [employer]    Authorization Time Period -06/11/22    Progress Note Due on Visit 20    PT Start Time 1118    PT Stop Time 1145    PT Time Calculation (min) 27 min    Equipment Utilized During Treatment Gait belt    Activity Tolerance Patient tolerated treatment well    Behavior During Therapy [facility] for tasks assessed/performed                      Past Medical History:  Diagnosis Date   Aortic stenosis    a. 01/2016 echo: mod AS; b. 04/2018 Cardiac MRI: sev AS; c. 05/2018 s/p mech AVR @ time of myomectomy; d. 07/2018 Echo: Mild AS/AI.   Atherosclerosis of aorta ([place])    by CT scan in past   Atrial fibrillation (HCC)    and aflutter. pt has a left atrial circuit that is not ablated. was on amiodarone-stopped, now use rate control.    Bladder cancer ([place])    hx; treated with BCG in past    Carotid artery disease ([place])    There was calcified plaque but no stenosis by carotid artery screening done [facility] October, 2013   CHB (complete heart block) [facility])    a. 05/2018 s/p BSX VVIR PPM ([facility]) in setting of bradycardia following myomectomy and AVR/MVR.   Diabetes mellitus    type not specified   Diabetic neuropathy ([place])    feet   Elevated liver enzymes    over time; hx   Excessive sweating    Glaucoma    Gout    Head injury    when slipped on ice 2004-2005. stabilized and back on Coumadin    Headache    migraines - distant past   History of cardiac cath    a. 05/2018 Cath ([facility]):  Non-obstructive CAD. Mildly elevated filling pressures w/ nl CI.   HOCM (hypertrophic obstructive cardiomyopathy) ([place])    a. 01/2016 Echo: EF 65-70%, no rwma, LVOT gradient of 80-55mHg, mod AS, SAM; b. 11/2017 Echo: EF 55-60%, near cavity obliteration in systole, mod AS, mild MS; c. 04/2018 Card MRI ([facility]): Sev LVH, EF >70%, Sev AS, LVOT obs w/ Sev MR, mild to mod TR/PR, mid-myocardial HE in basal-mid anteroseptum and inferoseptum; d. 05/2018 s/p Septal myomectomy; e. 07/2018 Echo: EF 60-65%. PASP 320mg.   Homocystinemia (HCMaries   elevated, mild    Hypercholesterolemia    treated.    Hypertension    Infection of right inner ear    Kidney mass    a. s/p laproscopic surgery woth cryoablation of a mass outside [facility] Chest: 2cm R kidney mass.   Mediastinal adenopathy    a. 12/208 CT Chest: up to 24m59mLL lung nodule. 1.5-1.7cm subcarinal/mediastinal adenopahty/right paratracheal lymph node; b. 11/2018 CT Chest: 2.5-3cm mediastinal adenopathy.   Mitral regurgitation    a. 04/2018 Cardiac MRI: sev MR; b.  05/2018 s/p mech MVR @ time of myomectomy; c. 07/2018 Echo: Mild MS.   Motion sickness    ocean boats   Nausea    Nonobstructive coronary artery disease    a. 05/2018 Cath ([facility]): nonobs CAD.   Obstructive sleep apnea    CPAP started successfully 2014   Orthostatic hypotension    a. orthostatic. dehydration. hospitalized 11/11   Sleep apnea    Significant obstructive sleep apnea diagnosed in August, 2012, the patient is to receive CPAP    Stroke [facility])    "2 old strokes" CT and MRI. [place] [facility] 11/11. diagnosis was dehydration, no acutal reports.    TSH elevation    on synthroid historically    Unsteady gait    August, 2012   Past Surgical History:  Procedure Laterality Date   BLADDER SURGERY     CATARACT EXTRACTION W/PHACO Left 05/11/2015   Procedure: CATARACT EXTRACTION PHACO AND INTRAOCULAR LENS PLACEMENT ([place]);  Surgeon: Leandrew Koyanagi, MD;   Location: [place];  Service: Ophthalmology;  Laterality: Left;  DIABETIC - oral meds, CPAP   COLONOSCOPY WITH PROPOFOL N/A 04/14/2019   Procedure: COLONOSCOPY WITH PROPOFOL;  Surgeon: [name], MD;  Location: [facility] ENDOSCOPY;  Service: Endoscopy;  Laterality: N/A;   ESOPHAGOGASTRODUODENOSCOPY (EGD) WITH PROPOFOL N/A 07/01/2018   Procedure: ESOPHAGOGASTRODUODENOSCOPY (EGD) WITH PROPOFOL;  Surgeon: [name], MD;  Location: [facility] ENDOSCOPY;  Service: Endoscopy;  Laterality: N/A;   INSERT / REPLACE / [place]     "froze mass"   MECHANICAL AORTIC AND MITRAL VALVE REPLACEMENT  05/2018   [facility]    TONSILLECTOMY     VALVE REPLACEMENT     Patient Active Problem List   Diagnosis Date Noted   Other abnormalities of gait and mobility 11/15/2021   Depression 11/15/2021   Encounter for fitting and adjustment of hearing aid 11/15/2021   Abnormal CT scan    Abnormal findings on radiological examination of gastrointestinal tract    Polyp of colon    Cancer of right kidney ([place]) 12/10/2018   Pulmonary nodules 12/01/2018   Mediastinal lymphadenopathy 12/01/2018   Heart block AV third degree ([place]) 10/07/2018   Pacemaker 10/07/2018   H/O aortic valve replacement 10/07/2018   Blood in stool    Bleeding duodenal ulcer    GIB (gastrointestinal bleeding) 06/30/2018   Acute upper GI bleed    Decreased activities of daily living (ADL) 06/09/2018   Decreased strength, endurance, and mobility 06/09/2018   Pleural effusion, right 06/09/2018   S/P AVR (aortic valve replacement) 06/08/2018   H/O ventricular septal myectomy 06/08/2018   Chronic anticoagulation 06/02/2018   Heart murmur, systolic 92/08/9416   H/O: rheumatic fever 03/21/2017   Tinnitus 10/30/2016   On warfarin therapy 07/06/2015   Arteriosclerosis of coronary artery 06/13/2015   Carcinoma in situ of bladder 06/13/2015   Diabetes mellitus, type 2 ([place]) 06/13/2015   Diabetic  neuropathy ([place]) 06/13/2015   Dizziness 06/13/2015   Essential (primary) hypertension 06/13/2015   Fatigue 06/13/2015   Glaucoma 06/13/2015   Hypercholesteremia 06/13/2015   Male hypogonadism 06/13/2015   Adult hypothyroidism 06/13/2015   Arthritis, degenerative 06/13/2015   CAP (community acquired pneumonia) 06/13/2015   Adenocarcinoma, renal cell ([place]) 06/13/2015   Benign head tremor 12/13/2014   Carotid arterial disease ([place]) 07/12/2014   Decreased cardiac output 07/12/2014   Cardiomyopathy, hypertrophic obstructive ([place]) 07/12/2014   Head injuries 07/12/2014   HOCM (hypertrophic obstructive cardiomyopathy) ([place])  Encounter for therapeutic drug monitoring 11/16/2013   Obstructive sleep apnea    Benign prostatic hypertrophy without urinary obstruction 06/09/2012   History of neoplasm of bladder 06/09/2012   Head injury    Atherosclerosis of aorta (HCC)    Hypotension    Stroke (HCC)    Unsteady gait    Excessive sweating    Nausea    ORTHOSTATIC HYPOTENSION, HX OF 09/01/2010   Overweight 03/07/2009   MITRAL REGURGITATION 03/05/2009   Chronic atrial fibrillation ([place]) 03/05/2009   Atrial fibrillation and flutter ([place]) 03/05/2009   Mitral and aortic incompetence 03/05/2009      REFERRING DIAG: Imbalance  THERAPY DIAG:  Difficulty in walking, not elsewhere classified  Other abnormalities of gait and mobility  Unsteadiness on feet  Muscle weakness (generalized)  Rationale for Evaluation and Treatment Rehabilitation  PERTINENT HISTORY:  Pt report she has been having balance issues secondary to his ears. Pt was in the artilerary. Pt sold a home in april and has fallen 5 times since then. Pt tripped over a piece of wood in back yard and fell on one occassion. Pt has history of cardiac rehab here at [facility]. Pt has a very difficult time with balance without his cane. He has difficulty with balance when he turns his head.   PRECAUTIONS: Fall  SUBJECTIVE: Pt doing well.  Denies any new falls or changes since last visit.   Presents to therapy with his wife; Tiffany Kocher  PAIN:  Are you having pain? No     TODAY'S TREATMENT: 07/03/22 Physical therapy treatment session today consisted of completing assessment of goals and administration of testing as demonstrated and documented in flow sheet, treatment, and goals section of this note. Addition treatments may be found below.     Note: Portions of this document were prepared using Dragon voice recognition software and although reviewed may contain unintentional dictation errors in syntax, grammar, or spelling.         PATIENT EDUCATION: Education details: Pt given verbal cuing on proper demonstration of technique during performance of exercises and as part of HEP. Person educated: Patient Education method: Explanation, Demonstration, Tactile cues, and Verbal cues Education comprehension: verbalized understanding, returned demonstration, and verbal cues required   HOME EXERCISE PROGRAM:  Access Code: [place] URL: [email or web address] Date: 04/26/2022 Prepared by: [place]  Exercises - Standing March with Counter Support  - 1 x daily - 7 x weekly - 3 sets - 10 reps - Standing Knee Flexion with Unilateral Counter Support  - 1 x daily - 7 x weekly - 3 sets - 10 reps - Heel Toe Raises with Unilateral Counter Support  - 1 x daily - 7 x weekly - 3 sets - 10 reps - Standing Hip Abduction with Unilateral Counter Support  - 1 x daily - 7 x weekly - 3 sets - 10 reps - Standing Hip Extension with Unilateral Counter Support  - 1 x daily - 7 x weekly - 3 sets - 10 reps   PT Short Term Goals        PT SHORT TERM GOAL #1   Title Patient will be independent in home exercise program to improve strength/mobility for better functional independence with ADLs.    Baseline no HEP 8/15: Not completing regularly, lots of discussion about importance of comliance    Time 4    Period Weeks     Status Progressing    Target Date 04/16/22  PT Long Term Goals       PT LONG TERM GOAL #1   Title Patient will increase FOTO score to equal to or greater than   48  to demonstrate statistically significant improvement in mobility and quality of life.    Baseline 42 9/19:54   Time 8    Period Weeks    Status Met    Target Date 06/11/22      PT LONG TERM GOAL #2   Title Patient  will complete five times sit to stand test in < 17 seconds indicating an increased LE strength and improved balance.    Baseline 23.53 sec 8/15: 17 sec heavy UE use  9/19: 15.8 sec   Time 12    Period Weeks    Status Met    Target Date 06/11/22      PT LONG TERM GOAL #3   Title Patient will increase Berg Balance score by > 6 points to demonstrate decreased fall risk during functional activities.    Baseline 34,  8/15:38 9/19: test next session    Time 12    Period Weeks    Status Partially met       PT LONG TERM GOAL #4   Title Patient will increase six minute walk test distance to >1000 for progression to community ambulator and improve gait ability    Baseline 725 feet 9/19: 745 feet without AD    Time 12    Period Weeks    Status Partially met     Target Date 06/11/22      PT LONG TERM GOAL #5   Title Patient will increase 10 meter walk test to >1.50ms as to improve gait speed for better community ambulation and to reduce fall risk.    Baseline .58 m/s 9/19:.65 m/s   Time 12    Period Weeks    Status Ongoing     Target Date 06/11/22                    Plan     Clinical Impression Statement Pt presents for progress note this date, limited ability to assess goals this date due to pt arriving late. Pt did make progress with all goals assessed but is still at risk for falls as a resuilt of his testing.  Patient makes progress with 5 times sit to stand meeting this goal but still requires upper extremity assist.  Will work on continuing to practice this to improve his  lower extremity strength as well as decrease his risk of falls.  Patient also progresses with 10 m walk test showing improved gait speed compared to previous testing.  In addition patient also improves with his independence with 6-minute walk test as evidenced by his ability to complete this test without assistive device, however, patient does have unsteadiness at times with 6-minute walk test.  Walk test Berg balance t neck session as patient did arrive late to the session we did not have time to complete this test.  Patient still making great progress at this time we will continue to benefit from skilled physical therapy to improve his lower extremity strength, balance, independence, and overall function.Pt will continue to benefit from skilled physical therapy intervention to address impairments, improve QOL, and attain therapy goals.           Personal Factors and Comorbidities Age;Comorbidity 1;Comorbidity 2;Comorbidity 3+    Comorbidities CAD, Peripheral neuropathy, T2DM, HTN,    Examination-Activity Limitations Caring for Others;Carry;Locomotion  Level;Stairs;Stand    Examination-Participation Restrictions Yard Work;Shop;Community Activity    Stability/Clinical Decision Making Evolving/Moderate complexity    Rehab Potential Fair    PT Frequency 2x / week    PT Duration 12 weeks    PT Treatment/Interventions ADLs/Self Care Home Management;Moist Heat;DME Instruction;Stair training;Functional mobility training;Therapeutic activities;Therapeutic exercise;Balance training;Neuromuscular re-education;Patient/family education;Wheelchair mobility training;Manual techniques;Dry needling;Energy conservation    PT Next Visit Plan test 6MWT and 10MWT, also establish HEP for 2 weeks pt will be at [place] Provided visit 2    Consulted and Agree with Plan of Care Patient;Family member/caregiver                Particia Lather PT, DPT  07/03/22 1:14 PM   07/03/22,  1:14 PM

## 2022-07-05 ENCOUNTER — Encounter: Payer: Self-pay | Admitting: Physical Therapy

## 2022-07-05 ENCOUNTER — Ambulatory Visit: Payer: PPO | Admitting: Physical Therapy

## 2022-07-05 DIAGNOSIS — R2681 Unsteadiness on feet: Secondary | ICD-10-CM

## 2022-07-05 DIAGNOSIS — R2689 Other abnormalities of gait and mobility: Secondary | ICD-10-CM

## 2022-07-05 DIAGNOSIS — M6281 Muscle weakness (generalized): Secondary | ICD-10-CM

## 2022-07-05 DIAGNOSIS — R262 Difficulty in walking, not elsewhere classified: Secondary | ICD-10-CM | POA: Diagnosis not present

## 2022-07-05 NOTE — Therapy (Signed)
OUTPATIENT PHYSICAL THERAPY treatment note     Patient Name: [name] [name] MRN: [id number] DOB:1934-10-12, 88 y.o., male Today's Date: 07/05/2022  PCP: [name], MD REFERRING PROVIDER: [name], MD   PT End of Session - 07/05/22 1100     Visit Number 21    Number of Visits 24    Date for PT Re-Evaluation 09/04/22    Authorization Type [employer]    Authorization Time Period -06/11/22    Progress Note Due on Visit 20    PT Start Time 1100    PT Stop Time 1144    PT Time Calculation (min) 44 min    Equipment Utilized During Treatment Gait belt    Activity Tolerance Patient tolerated treatment well    Behavior During Therapy [facility] for tasks assessed/performed                       Past Medical History:  Diagnosis Date   Aortic stenosis    a. 01/2016 echo: mod AS; b. 04/2018 Cardiac MRI: sev AS; c. 05/2018 s/p mech AVR @ time of myomectomy; d. 07/2018 Echo: Mild AS/AI.   Atherosclerosis of aorta ([place])    by CT scan in past   Atrial fibrillation (HCC)    and aflutter. pt has a left atrial circuit that is not ablated. was on amiodarone-stopped, now use rate control.    Bladder cancer ([place])    hx; treated with BCG in past    Carotid artery disease ([place])    There was calcified plaque but no stenosis by carotid artery screening done at [facility] October, 2013   CHB (complete heart block) [facility])    a. 05/2018 s/p BSX VVIR PPM ([facility]) in setting of bradycardia following myomectomy and AVR/MVR.   Diabetes mellitus    type not specified   Diabetic neuropathy ([place])    feet   Elevated liver enzymes    over time; hx   Excessive sweating    Glaucoma    Gout    Head injury    when slipped on ice 2004-2005. stabilized and back on Coumadin    Headache    migraines - distant past   History of cardiac cath    a. 05/2018 Cath ([facility]): Non-obstructive CAD. Mildly elevated filling pressures w/ nl CI.   HOCM (hypertrophic  obstructive cardiomyopathy) ([place])    a. 01/2016 Echo: EF 65-70%, no rwma, LVOT gradient of 80-72mHg, mod AS, SAM; b. 11/2017 Echo: EF 55-60%, near cavity obliteration in systole, mod AS, mild MS; c. 04/2018 Card MRI ([facility]): Sev LVH, EF >70%, Sev AS, LVOT obs w/ Sev MR, mild to mod TR/PR, mid-myocardial HE in basal-mid anteroseptum and inferoseptum; d. 05/2018 s/p Septal myomectomy; e. 07/2018 Echo: EF 60-65%. PASP 363mg.   [place]ocystinemia ([place]   elevated, mild    Hypercholesterolemia    treated.    Hypertension    Infection of right inner ear    Kidney mass    a. s/p laproscopic surgery woth cryoablation of a mass outside [facility] Chest: 2cm R kidney mass.   Mediastinal adenopathy    a. 12/208 CT Chest: up to 28m1028mLL lung nodule. 1.5-1.7cm subcarinal/mediastinal adenopahty/right paratracheal lymph node; b. 11/2018 CT Chest: 2.5-3cm mediastinal adenopathy.   Mitral regurgitation    a. 04/2018 Cardiac MRI: sev MR; b. 05/2018 s/p mech MVR @ time of myomectomy; c. 07/2018 Echo: Mild MS.   Motion  sickness    ocean boats   Nausea    Nonobstructive coronary artery disease    a. 05/2018 Cath ([facility]): nonobs CAD.   Obstructive sleep apnea    CPAP started successfully 2014   Orthostatic hypotension    a. orthostatic. dehydration. hospitalized 11/11   Sleep apnea    Significant obstructive sleep apnea diagnosed in August, 2012, the patient is to receive CPAP    Stroke [facility])    "2 old strokes" CT and MRI. [place] [facility] 11/11. diagnosis was dehydration, no acutal reports.    TSH elevation    on synthroid historically    Unsteady gait    August, 2012   Past Surgical History:  Procedure Laterality Date   BLADDER SURGERY     CATARACT EXTRACTION W/PHACO Left 05/11/2015   Procedure: CATARACT EXTRACTION PHACO AND INTRAOCULAR LENS PLACEMENT ([place]);  Surgeon: Leandrew Koyanagi, MD;  Location: [place];  Service: Ophthalmology;  Laterality: Left;  DIABETIC - oral  meds, CPAP   COLONOSCOPY WITH PROPOFOL N/A 04/14/2019   Procedure: COLONOSCOPY WITH PROPOFOL;  Surgeon: [name], MD;  Location: [facility] ENDOSCOPY;  Service: Endoscopy;  Laterality: N/A;   ESOPHAGOGASTRODUODENOSCOPY (EGD) WITH PROPOFOL N/A 07/01/2018   Procedure: ESOPHAGOGASTRODUODENOSCOPY (EGD) WITH PROPOFOL;  Surgeon: [name], MD;  Location: [facility] ENDOSCOPY;  Service: Endoscopy;  Laterality: N/A;   INSERT / REPLACE / [place]     "froze mass"   MECHANICAL AORTIC AND MITRAL VALVE REPLACEMENT  05/2018   [facility]    TONSILLECTOMY     VALVE REPLACEMENT     Patient Active Problem List   Diagnosis Date Noted   Other abnormalities of gait and mobility 11/15/2021   Depression 11/15/2021   Encounter for fitting and adjustment of hearing aid 11/15/2021   Abnormal CT scan    Abnormal findings on radiological examination of gastrointestinal tract    Polyp of colon    Cancer of right kidney ([place]) 12/10/2018   Pulmonary nodules 12/01/2018   Mediastinal lymphadenopathy 12/01/2018   Heart block AV third degree ([place]) 10/07/2018   Pacemaker 10/07/2018   H/O aortic valve replacement 10/07/2018   Blood in stool    Bleeding duodenal ulcer    GIB (gastrointestinal bleeding) 06/30/2018   Acute upper GI bleed    Decreased activities of daily living (ADL) 06/09/2018   Decreased strength, endurance, and mobility 06/09/2018   Pleural effusion, right 06/09/2018   S/P AVR (aortic valve replacement) 06/08/2018   H/O ventricular septal myectomy 06/08/2018   Chronic anticoagulation 06/02/2018   Heart murmur, systolic 37/48/2707   H/O: rheumatic fever 03/21/2017   Tinnitus 10/30/2016   On warfarin therapy 07/06/2015   Arteriosclerosis of coronary artery 06/13/2015   Carcinoma in situ of bladder 06/13/2015   Diabetes mellitus, type 2 ([place]) 06/13/2015   Diabetic neuropathy ([place]) 06/13/2015   Dizziness 06/13/2015   Essential (primary) hypertension  06/13/2015   Fatigue 06/13/2015   Glaucoma 06/13/2015   Hypercholesteremia 06/13/2015   Male hypogonadism 06/13/2015   Adult hypothyroidism 06/13/2015   Arthritis, degenerative 06/13/2015   CAP (community acquired pneumonia) 06/13/2015   Adenocarcinoma, renal cell ([place]) 06/13/2015   Benign head tremor 12/13/2014   Carotid arterial disease ([place]) 07/12/2014   Decreased cardiac output 07/12/2014   Cardiomyopathy, hypertrophic obstructive ([place]) 07/12/2014   Head injuries 07/12/2014   HOCM (hypertrophic obstructive cardiomyopathy) ([place])    Encounter for therapeutic drug monitoring 11/16/2013   Obstructive sleep apnea  Benign prostatic hypertrophy without urinary obstruction 06/09/2012   History of neoplasm of bladder 06/09/2012   Head injury    Atherosclerosis of aorta (HCC)    Hypotension    Stroke (HCC)    Unsteady gait    Excessive sweating    Nausea    ORTHOSTATIC HYPOTENSION, HX OF 09/01/2010   Overweight 03/07/2009   MITRAL REGURGITATION 03/05/2009   Chronic atrial fibrillation ([place]) 03/05/2009   Atrial fibrillation and flutter ([place]) 03/05/2009   Mitral and aortic incompetence 03/05/2009      REFERRING DIAG: Imbalance  THERAPY DIAG:  Difficulty in walking, not elsewhere classified  Other abnormalities of gait and mobility  Unsteadiness on feet  Muscle weakness (generalized)  Rationale for Evaluation and Treatment Rehabilitation  PERTINENT HISTORY:  Pt report she has been having balance issues secondary to his ears. Pt was in the artilerary. Pt sold a home in april and has fallen 5 times since then. Pt tripped over a piece of wood in back yard and fell on one occassion. Pt has history of cardiac rehab [facility] at [facility]. Pt has a very difficult time with balance without his cane. He has difficulty with balance when he turns his head.   PRECAUTIONS: Fall  SUBJECTIVE: Pt doing well. Denies any new falls or changes since last visit.   Presents to therapy with his wife;  Tiffany Kocher  PAIN:  Are you having pain? No     TODAY'S TREATMENT: 07/05/22 Completed BERG balance test to continue with progress note assessments from prior session.   [facility] PT Assessment - 07/05/22 0001       Berg Balance Test   Sit to Stand Able to stand  independently using hands    Standing Unsupported Able to stand safely 2 minutes    Sitting with Back Unsupported but Feet Supported on Floor or Stool Able to sit safely and securely 2 minutes    Stand to Sit Controls descent by using hands    Transfers Able to transfer safely, definite need of hands    Standing Unsupported with Eyes Closed Able to stand 10 seconds with supervision    Standing Unsupported with Feet Together Able to place feet together independently and stand 1 minute safely    From Standing, Reach Forward with Outstretched Arm Can reach forward >12 cm safely (5")    From Standing Position, Pick up Object from Floor Unable to pick up shoe, but reaches 2-5 cm (1-2") from shoe and balances independently    From Standing Position, Turn to Look Behind Over each Shoulder Looks behind one side only/other side shows less weight shift    Turn 360 Degrees Able to turn 360 degrees safely but slowly    Standing Unsupported, Alternately Place Feet on Step/Stool Able to stand independently and complete 8 steps >20 seconds    Standing Unsupported, One Foot in Front Able to plae foot ahead of the other independently and hold 30 seconds    Standing on One Leg Able to lift leg independently and hold 5-10 seconds    Total Score 43              Exercise/Activity Sets/Reps/Time/ Resistance Assistance Charge type Comments .=Unless otherwise stated, CGA was provided and gait belt donned in order to ensure pt safety    Standing balance on airex with hip abduction 2 x 10 with 2 # AW  CGA  TA Cues for proper form   Soccer ball penalty kicks then retrieving ball without UE and setting  up kick again to opposite side  X 10 with dribbling  and shot set up [place]  NMR Working on single leg balance and LE coordination and balance throughout. Great balance and increased shot power and accuracy this session indicating improved coordination and single leg balance ability. Pt also enjoys intervention and finds it challenging.    STS with airex pad and no UE support  1*8 reps with no UE assist  CGA, TA Improved form with no need for UE assist, cues for full upright stance and anterior weight shift.   Ambulaiton with 1.5# AW with intermittent retro ambulation and with cues for increasing speed   2*300 feet, alternating with STS  No AD  TA 2 LOB with right turns due to narrow stepping and losing balance on first bout, on second bout pt challenged with color coated tiles and external focus to keep feet apart.   Treatment Provided this session   Pt educated throughout session about proper posture and technique with exercises. Improved exercise technique, movement at target joints, use of target muscles after min to mod verbal, visual, tactile cues. Note: Portions of this document were prepared using Dragon voice recognition software and although reviewed may contain unintentional dictation errors in syntax, grammar, or spelling.           PATIENT EDUCATION: Education details: Pt given verbal cuing on proper demonstration of technique during performance of exercises and as part of HEP. Person educated: Patient Education method: Explanation, Demonstration, Tactile cues, and Verbal cues Education comprehension: verbalized understanding, returned demonstration, and verbal cues required   HOME EXERCISE PROGRAM:  Access Code: [place] URL: [email or web address] Date: 04/26/2022 Prepared by: [place]  Exercises - Standing March with Counter Support  - 1 x daily - 7 x weekly - 3 sets - 10 reps - Standing Knee Flexion with Unilateral Counter Support  - 1 x daily - 7 x weekly - 3 sets - 10 reps - Heel Toe Raises with  Unilateral Counter Support  - 1 x daily - 7 x weekly - 3 sets - 10 reps - Standing Hip Abduction with Unilateral Counter Support  - 1 x daily - 7 x weekly - 3 sets - 10 reps - Standing Hip Extension with Unilateral Counter Support  - 1 x daily - 7 x weekly - 3 sets - 10 reps   PT Short Term Goals        PT SHORT TERM GOAL #1   Title Patient will be independent in home exercise program to improve strength/mobility for better functional independence with ADLs.    Baseline no HEP 8/15: Not completing regularly, lots of discussion about importance of comliance    Time 4    Period Weeks    Status Progressing    Target Date 04/16/22              PT Long Term Goals       PT LONG TERM GOAL #1   Title Patient will increase FOTO score to equal to or greater than   48  to demonstrate statistically significant improvement in mobility and quality of life.    Baseline 42 9/19:54   Time 8    Period Weeks    Status Met    Target Date 06/11/22      PT LONG TERM GOAL #2   Title Patient  will complete five times sit to stand test in < 17 seconds indicating an increased LE strength and improved balance.  Baseline 23.53 sec 8/15: 17 sec heavy UE use  9/19: 15.8 sec   Time 12    Period Weeks    Status Met          PT LONG TERM GOAL #3   Title Patient will increase Berg Balance score by > 6 points to demonstrate decreased fall risk during functional activities.    Baseline 34,  8/15:38 9/19: test next session 9/21:43   Time 12    Period Weeks    Status Partially met       PT LONG TERM GOAL #4   Title Patient will increase six minute walk test distance to >1000 for progression to community ambulator and improve gait ability    Baseline 725 feet 9/19: 745 feet without AD    Time 12    Period Weeks    Status Partially met     Target Date 09/04/22      PT LONG TERM GOAL #5   Title Patient will increase 10 meter walk test to >1.35ms as to improve gait speed for better community  ambulation and to reduce fall risk.    Baseline .58 m/s 9/19: .65 m/s   Time 12    Period Weeks    Status Ongoing     Target Date 09/04/22                    Plan     Clinical Impression Statement  Continued with current plan of care as laid out in evaluation and recent prior sessions. Pt remains motivated to advance progress toward goals in order to maximize independence and safety at home. Pt requires high level assistance and cuing for completion of exercises in order to provide adequate level of stimulation and perturbation. Author allows pt as much opportunity as possible to perform independent righting strategies, only stepping in when pt is unable to prevent falling to floor. Pt closely monitored throughout session for safe vitals response and to maximize patient safety during interventions. Pt progressed with ambulation distance without AD.  Patient completes BMerrilee Janskybalance test this session with good results.  Patient has made significant improvement in this indicating decreased risk of falls, however, patient still at high risk of falls based on score and will continue to benefit from skilled therapy to decrease his risk of falls.  Pt continues to demonstrate progress toward goals AEB progression of some interventions this date either in volume or intensity.         Personal Factors and Comorbidities Age;Comorbidity 1;Comorbidity 2;Comorbidity 3+    Comorbidities CAD, Peripheral neuropathy, T2DM, HTN,    Examination-Activity Limitations Caring for Others;Carry;Locomotion Level;Stairs;Stand    Examination-Participation Restrictions Yard Work;Shop;Community Activity    Stability/Clinical Decision Making Evolving/Moderate complexity    Rehab Potential Fair    PT Frequency 2x / week    PT Duration 12 weeks    PT Treatment/Interventions ADLs/Self Care Home Management;Moist Heat;DME Instruction;Stair training;Functional mobility training;Therapeutic activities;Therapeutic  exercise;Balance training;Neuromuscular re-education;Patient/family education;Wheelchair mobility training;Manual techniques;Dry needling;Energy conservation    PT Next Visit Plan test 6MWT and 10MWT, also establish HEP for 2 weeks pt will be at [place] 2    Consulted and Agree with Plan of Care Patient;Family member/caregiver                CParticia LatherPT, DPT  07/05/22 1:10 PM   07/05/22, 1:10 PM

## 2022-07-09 NOTE — Therapy (Deleted)
OUTPATIENT PHYSICAL THERAPY treatment note     Patient Name: [name] [name] MRN: [id number] DOB:1934-02-17, 88 y.o., male Today's Date: 07/09/2022  PCP: [name], MD REFERRING PROVIDER: [name], MD               Past Medical History:  Diagnosis Date   Aortic stenosis    a. 01/2016 echo: mod AS; b. 04/2018 Cardiac MRI: sev AS; c. 05/2018 s/p mech AVR @ time of myomectomy; d. 07/2018 Echo: Mild AS/AI.   Atherosclerosis of aorta ([place])    by CT scan in past   Atrial fibrillation (HCC)    and aflutter. pt has a left atrial circuit that is not ablated. was on amiodarone-stopped, now use rate control.    Bladder cancer ([place])    hx; treated with BCG in past    Carotid artery disease ([place])    There was calcified plaque but no stenosis by carotid artery screening done at [facility] October, 2013   CHB (complete heart block) [facility])    a. 05/2018 s/p BSX VVIR PPM ([facility]) in setting of bradycardia following myomectomy and AVR/MVR.   Diabetes mellitus    type not specified   Diabetic neuropathy ([place])    feet   Elevated liver enzymes    over time; hx   Excessive sweating    Glaucoma    Gout    Head injury    when slipped on ice 2004-2005. stabilized and back on Coumadin    Headache    migraines - distant past   History of cardiac cath    a. 05/2018 Cath ([facility]): Non-obstructive CAD. Mildly elevated filling pressures w/ nl CI.   HOCM (hypertrophic obstructive cardiomyopathy) ([place])    a. 01/2016 Echo: EF 65-70%, no rwma, LVOT gradient of 80-70mHg, mod AS, SAM; b. 11/2017 Echo: EF 55-60%, near cavity obliteration in systole, mod AS, mild MS; c. 04/2018 Card MRI ([facility]): Sev LVH, EF >70%, Sev AS, LVOT obs w/ Sev MR, mild to mod TR/PR, mid-myocardial HE in basal-mid anteroseptum and inferoseptum; d. 05/2018 s/p Septal myomectomy; e. 07/2018 Echo: EF 60-65%. PASP 323mg.   Homocystinemia ([facility]   elevated, mild    Hypercholesterolemia    treated.     Hypertension    Infection of right inner ear    Kidney mass    a. s/p laproscopic surgery woth cryoablation of a mass outside [facility] Chest: 2cm R kidney mass.   Mediastinal adenopathy    a. 12/208 CT Chest: up to 12m55mLL lung nodule. 1.5-1.7cm subcarinal/mediastinal adenopahty/right paratracheal lymph node; b. 11/2018 CT Chest: 2.5-3cm mediastinal adenopathy.   Mitral regurgitation    a. 04/2018 Cardiac MRI: sev MR; b. 05/2018 s/p mech MVR @ time of myomectomy; c. 07/2018 Echo: Mild MS.   Motion sickness    ocean boats   Nausea    Nonobstructive coronary artery disease    a. 05/2018 Cath ([facility]): nonobs CAD.   Obstructive sleep apnea    CPAP started successfully 2014   Orthostatic hypotension    a. orthostatic. dehydration. hospitalized 11/11   Sleep apnea    Significant obstructive sleep apnea diagnosed in August, 2012, the patient is to receive CPAP    [facility]  "[facility] strokes" CT and MRI. [place] [facility] 11/11. diagnosis was dehydration, no acutal reports.    TSH elevation    on synthroid historically    Unsteady gait    August, 2012  Past Surgical History:  Procedure Laterality Date   BLADDER SURGERY     CATARACT EXTRACTION W/PHACO Left 05/11/2015   Procedure: CATARACT EXTRACTION PHACO AND INTRAOCULAR LENS PLACEMENT (IOC);  Surgeon: Leandrew Koyanagi, MD;  Location: [place];  Service: Ophthalmology;  Laterality: Left;  DIABETIC - oral meds, CPAP   COLONOSCOPY WITH PROPOFOL N/A 04/14/2019   Procedure: COLONOSCOPY WITH PROPOFOL;  Surgeon: [name], MD;  Location: [facility] ENDOSCOPY;  Service: Endoscopy;  Laterality: N/A;   ESOPHAGOGASTRODUODENOSCOPY (EGD) WITH PROPOFOL N/A 07/01/2018   Procedure: ESOPHAGOGASTRODUODENOSCOPY (EGD) WITH PROPOFOL;  Surgeon: [name], MD;  Location: [facility] ENDOSCOPY;  Service: Endoscopy;  Laterality: N/A;   INSERT / REPLACE / [place]     "froze mass"    MECHANICAL AORTIC AND MITRAL VALVE REPLACEMENT  05/2018   [facility]    TONSILLECTOMY     VALVE REPLACEMENT     Patient Active Problem List   Diagnosis Date Noted   Other abnormalities of gait and mobility 11/15/2021   Depression 11/15/2021   Encounter for fitting and adjustment of hearing aid 11/15/2021   Abnormal CT scan    Abnormal findings on radiological examination of gastrointestinal tract    Polyp of colon    Cancer of right kidney ([place]) 12/10/2018   Pulmonary nodules 12/01/2018   Mediastinal lymphadenopathy 12/01/2018   Heart block AV third degree ([place]) 10/07/2018   Pacemaker 10/07/2018   H/O aortic valve replacement 10/07/2018   Blood in stool    Bleeding duodenal ulcer    GIB (gastrointestinal bleeding) 06/30/2018   Acute upper GI bleed    Decreased activities of daily living (ADL) 06/09/2018   Decreased strength, endurance, and mobility 06/09/2018   Pleural effusion, right 06/09/2018   S/P AVR (aortic valve replacement) 06/08/2018   H/O ventricular septal myectomy 06/08/2018   Chronic anticoagulation 06/02/2018   Heart murmur, systolic 93/79/0240   H/O: rheumatic fever 03/21/2017   Tinnitus 10/30/2016   On warfarin therapy 07/06/2015   Arteriosclerosis of coronary artery 06/13/2015   Carcinoma in situ of bladder 06/13/2015   Diabetes mellitus, type 2 ([place]) 06/13/2015   Diabetic neuropathy ([place]) 06/13/2015   Dizziness 06/13/2015   Essential (primary) hypertension 06/13/2015   Fatigue 06/13/2015   Glaucoma 06/13/2015   Hypercholesteremia 06/13/2015   Male hypogonadism 06/13/2015   Adult hypothyroidism 06/13/2015   Arthritis, degenerative 06/13/2015   CAP (community acquired pneumonia) 06/13/2015   Adenocarcinoma, renal cell ([place]) 06/13/2015   Benign head tremor 12/13/2014   Carotid arterial disease ([place]) 07/12/2014   Decreased cardiac output 07/12/2014   Cardiomyopathy, hypertrophic obstructive ([place]) 07/12/2014   Head injuries 07/12/2014   HOCM (hypertrophic  obstructive cardiomyopathy) ([place])    Encounter for therapeutic drug monitoring 11/16/2013   Obstructive sleep apnea    Benign prostatic hypertrophy without urinary obstruction 06/09/2012   History of neoplasm of bladder 06/09/2012   Head injury    Atherosclerosis of aorta (HCC)    Hypotension    Stroke (HCC)    Unsteady gait    Excessive sweating    Nausea    ORTHOSTATIC HYPOTENSION, HX OF 09/01/2010   Overweight 03/07/2009   MITRAL REGURGITATION 03/05/2009   Chronic atrial fibrillation ([place]) 03/05/2009   Atrial fibrillation and flutter ([place]) 03/05/2009   Mitral and aortic incompetence 03/05/2009      REFERRING DIAG: Imbalance  THERAPY DIAG:  No diagnosis found.  Rationale for Evaluation and Treatment Rehabilitation  PERTINENT HISTORY:  Pt report  she has been having balance issues secondary to his ears. Pt was in the artilerary. Pt sold a home in april and has fallen 5 times since then. Pt tripped over a piece of wood in back yard and fell on one occassion. Pt has history of cardiac rehab here at [facility]. Pt has a very difficult time with balance without his cane. He has difficulty with balance when he turns his head.   PRECAUTIONS: Fall  SUBJECTIVE: Pt doing well. Denies any new falls or changes since last visit.   Presents to therapy with his wife; Tiffany Kocher  PAIN:  Are you having pain? No     TODAY'S TREATMENT: 07/09/22 Completed BERG balance test to continue with progress note assessments from prior session.      Exercise/Activity Sets/Reps/Time/ Resistance Assistance Charge type Comments .=Unless otherwise stated, CGA was provided and gait belt donned in order to ensure pt safety    Standing balance on airex with hip abduction 2 x 10 with 2 # AW  CGA  TA Cues for proper form   Soccer ball penalty kicks then retrieving ball without UE and setting up kick again to opposite side  X 10 with dribbling and shot set up [place]  NMR Working on single leg balance and LE  coordination and balance throughout. Great balance and increased shot power and accuracy this session indicating improved coordination and single leg balance ability. Pt also enjoys intervention and finds it challenging.    STS with airex pad and no UE support  1*8 reps with no UE assist  CGA, TA Improved form with no need for UE assist, cues for full upright stance and anterior weight shift.   Ambulaiton with 1.5# AW with intermittent retro ambulation and with cues for increasing speed   2*300 feet, alternating with STS  No AD  TA 2 LOB with right turns due to narrow stepping and losing balance on first bout, on second bout pt challenged with color coated tiles and external focus to keep feet apart.   Treatment Provided this session   Pt educated throughout session about proper posture and technique with exercises. Improved exercise technique, movement at target joints, use of target muscles after min to mod verbal, visual, tactile cues. Note: Portions of this document were prepared using Dragon voice recognition software and although reviewed may contain unintentional dictation errors in syntax, grammar, or spelling.           PATIENT EDUCATION: Education details: Pt given verbal cuing on proper demonstration of technique during performance of exercises and as part of HEP. Person educated: Patient Education method: Explanation, Demonstration, Tactile cues, and Verbal cues Education comprehension: verbalized understanding, returned demonstration, and verbal cues required   HOME EXERCISE PROGRAM:  Access Code: [place] URL: [email or web address] Date: 04/26/2022 Prepared by: [place]  Exercises - Standing March with Counter Support  - 1 x daily - 7 x weekly - 3 sets - 10 reps - Standing Knee Flexion with Unilateral Counter Support  - 1 x daily - 7 x weekly - 3 sets - 10 reps - Heel Toe Raises with Unilateral Counter Support  - 1 x daily - 7 x weekly - 3 sets - 10  reps - Standing Hip Abduction with Unilateral Counter Support  - 1 x daily - 7 x weekly - 3 sets - 10 reps - Standing Hip Extension with Unilateral Counter Support  - 1 x daily - 7 x weekly - 3 sets - 10 reps   PT  Short Term Goals        PT SHORT TERM GOAL #1   Title Patient will be independent in home exercise program to improve strength/mobility for better functional independence with ADLs.    Baseline no HEP 8/15: Not completing regularly, lots of discussion about importance of comliance    Time 4    Period Weeks    Status Progressing    Target Date 04/16/22              PT Long Term Goals       PT LONG TERM GOAL #1   Title Patient will increase FOTO score to equal to or greater than   48  to demonstrate statistically significant improvement in mobility and quality of life.    Baseline 42 9/19:54   Time 8    Period Weeks    Status Met    Target Date 06/11/22      PT LONG TERM GOAL #2   Title Patient  will complete five times sit to stand test in < 17 seconds indicating an increased LE strength and improved balance.    Baseline 23.53 sec 8/15: 17 sec heavy UE use  9/19: 15.8 sec   Time 12    Period Weeks    Status Met          PT LONG TERM GOAL #3   Title Patient will increase Berg Balance score by > 6 points to demonstrate decreased fall risk during functional activities.    Baseline 34,  8/15:38 9/19: test next session 9/21:43   Time 12    Period Weeks    Status Partially met       PT LONG TERM GOAL #4   Title Patient will increase six minute walk test distance to >1000 for progression to community ambulator and improve gait ability    Baseline 725 feet 9/19: 745 feet without AD    Time 12    Period Weeks    Status Partially met     Target Date 09/04/22      PT LONG TERM GOAL #5   Title Patient will increase 10 meter walk test to >1.6ms as to improve gait speed for better community ambulation and to reduce fall risk.    Baseline .58 m/s 9/19: .65 m/s    Time 12    Period Weeks    Status Ongoing     Target Date 09/04/22                    Plan     Clinical Impression Statement  Continued with current plan of care as laid out in evaluation and recent prior sessions. Pt remains motivated to advance progress toward goals in order to maximize independence and safety at home. Pt requires high level assistance and cuing for completion of exercises in order to provide adequate level of stimulation and perturbation. Author allows pt as much opportunity as possible to perform independent righting strategies, only stepping in when pt is unable to prevent falling to floor. Pt closely monitored throughout session for safe vitals response and to maximize patient safety during interventions. Pt progressed with ambulation distance without AD.  Patient completes BMerrilee Janskybalance test this session with good results.  Patient has made significant improvement in this indicating decreased risk of falls, however, patient still at high risk of falls based on score and will continue to benefit from skilled therapy to decrease his risk of falls.  Pt continues to demonstrate  progress toward goals AEB progression of some interventions this date either in volume or intensity.         Personal Factors and Comorbidities Age;Comorbidity 1;Comorbidity 2;Comorbidity 3+    Comorbidities CAD, Peripheral neuropathy, T2DM, HTN,    Examination-Activity Limitations Caring for Others;Carry;Locomotion Level;Stairs;Stand    Examination-Participation Restrictions Yard Work;Shop;Community Activity    Stability/Clinical Decision Making Evolving/Moderate complexity    Rehab Potential Fair    PT Frequency 2x / week    PT Duration 12 weeks    PT Treatment/Interventions ADLs/Self Care Home Management;Moist Heat;DME Instruction;Stair training;Functional mobility training;Therapeutic activities;Therapeutic exercise;Balance training;Neuromuscular re-education;Patient/family  education;Wheelchair mobility training;Manual techniques;Dry needling;Energy conservation    PT Next Visit Plan test 6MWT and 10MWT, also establish HEP for 2 weeks pt will be at [place] Provided visit 2    Consulted and Agree with Plan of Care Patient;Family member/caregiver                Particia Lather PT, DPT  07/09/22 4:40 PM   07/09/22, 4:40 PM

## 2022-07-10 ENCOUNTER — Ambulatory Visit: Payer: PPO | Admitting: Physical Therapy

## 2022-07-11 ENCOUNTER — Encounter: Payer: Self-pay | Admitting: Internal Medicine

## 2022-07-11 ENCOUNTER — Inpatient Hospital Stay (HOSPITAL_BASED_OUTPATIENT_CLINIC_OR_DEPARTMENT_OTHER): Payer: PPO | Admitting: Internal Medicine

## 2022-07-11 ENCOUNTER — Ambulatory Visit: Payer: PPO | Attending: Cardiovascular Disease

## 2022-07-11 ENCOUNTER — Inpatient Hospital Stay: Payer: PPO | Attending: Internal Medicine

## 2022-07-11 VITALS — BP 147/97 | HR 71 | Temp 97.9°F | Resp 18 | Wt 167.0 lb

## 2022-07-11 DIAGNOSIS — K7460 Unspecified cirrhosis of liver: Secondary | ICD-10-CM | POA: Insufficient documentation

## 2022-07-11 DIAGNOSIS — R590 Localized enlarged lymph nodes: Secondary | ICD-10-CM

## 2022-07-11 DIAGNOSIS — Z5181 Encounter for therapeutic drug level monitoring: Secondary | ICD-10-CM

## 2022-07-11 DIAGNOSIS — I700 Atherosclerosis of aorta: Secondary | ICD-10-CM | POA: Insufficient documentation

## 2022-07-11 DIAGNOSIS — D649 Anemia, unspecified: Secondary | ICD-10-CM | POA: Diagnosis not present

## 2022-07-11 DIAGNOSIS — I482 Chronic atrial fibrillation, unspecified: Secondary | ICD-10-CM

## 2022-07-11 DIAGNOSIS — R911 Solitary pulmonary nodule: Secondary | ICD-10-CM | POA: Diagnosis not present

## 2022-07-11 DIAGNOSIS — I4892 Unspecified atrial flutter: Secondary | ICD-10-CM | POA: Diagnosis not present

## 2022-07-11 DIAGNOSIS — Z85528 Personal history of other malignant neoplasm of kidney: Secondary | ICD-10-CM | POA: Insufficient documentation

## 2022-07-11 DIAGNOSIS — I4891 Unspecified atrial fibrillation: Secondary | ICD-10-CM | POA: Diagnosis not present

## 2022-07-11 LAB — CBC WITH DIFFERENTIAL/PLATELET
Abs Immature Granulocytes: 0.03 10*3/uL (ref 0.00–0.07)
Basophils Absolute: 0.1 10*3/uL (ref 0.0–0.1)
Basophils Relative: 1 %
Eosinophils Absolute: 0.4 10*3/uL (ref 0.0–0.5)
Eosinophils Relative: 4 %
HCT: 39.3 % (ref 39.0–52.0)
Hemoglobin: 13.1 g/dL (ref 13.0–17.0)
Immature Granulocytes: 0 %
Lymphocytes Relative: 17 %
Lymphs Abs: 1.5 10*3/uL (ref 0.7–4.0)
MCH: 30.9 pg (ref 26.0–34.0)
MCHC: 33.3 g/dL (ref 30.0–36.0)
MCV: 92.7 fL (ref 80.0–100.0)
Monocytes Absolute: 0.6 10*3/uL (ref 0.1–1.0)
Monocytes Relative: 6 %
Neutro Abs: 6.2 10*3/uL (ref 1.7–7.7)
Neutrophils Relative %: 72 %
Platelets: 168 10*3/uL (ref 150–400)
RBC: 4.24 MIL/uL (ref 4.22–5.81)
RDW: 13.1 % (ref 11.5–15.5)
WBC: 8.8 10*3/uL (ref 4.0–10.5)
nRBC: 0 % (ref 0.0–0.2)

## 2022-07-11 LAB — COMPREHENSIVE METABOLIC PANEL
ALT: 30 U/L (ref 0–44)
AST: 35 U/L (ref 15–41)
Albumin: 4.1 g/dL (ref 3.5–5.0)
Alkaline Phosphatase: 93 U/L (ref 38–126)
Anion gap: 5 (ref 5–15)
BUN: 16 mg/dL (ref 8–23)
CO2: 31 mmol/L (ref 22–32)
Calcium: 9.2 mg/dL (ref 8.9–10.3)
Chloride: 102 mmol/L (ref 98–111)
Creatinine, Ser: 0.96 mg/dL (ref 0.61–1.24)
GFR, Estimated: >60 mL/min (ref >60)
Glucose, Bld: 176 mg/dL — ABNORMAL HIGH (ref 70–99)
Potassium: 4.4 mmol/L (ref 3.5–5.1)
Sodium: 138 mmol/L (ref 135–145)
Total Bilirubin: 0.9 mg/dL (ref 0.3–1.2)
Total Protein: 8.1 g/dL (ref 6.5–8.1)

## 2022-07-11 LAB — POCT INR: INR: 5.2 — AB (ref 2.0–3.0)

## 2022-07-11 LAB — LACTATE DEHYDROGENASE: LDH: 268 U/L — ABNORMAL HIGH (ref 98–192)

## 2022-07-11 NOTE — Progress Notes (Signed)
Patient here today for follow up regarding lymphadenopathy. Patient denies any concerns today.

## 2022-07-11 NOTE — Assessment & Plan Note (Addendum)
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 [Since December 2018]. CT scan FEB 2023 [compared to Feb 2022]-left upper lobe- increasingly solid appearance of an area within geographic ground-glass changes- ?  Slow-growing bronchogenic neoplasm.  MARCH 2023-PET scan:  Mixed attenuation anterior left upper lobe lesion of concern on recent diagnostic chest CT shows no substantial hypermetabolism; While reassuring, low-grade or well differentiated neoplasm can be poorly FDG avid and continued close attention recommended; Stable mediastinal lymphadenopathy with low level FDG accumulation comparing back to 06/09/2019. Given lack of appreciable change, this suggest benign/reactive etiology;   I again reassured the patient/wife that this new lesion does not seem to be life-threatening in the context of patient's age and multiple comorbidities. Will repeat CT non-contrast chest.   #History of A-fib on-Coumadin/valve- [Dr.Arida]-incidental left atrial thrombus noted on CT imaging; discussed with Dr. Fletcher Anon- STABLE.   # Anemia/Cirhosis- 13 ? Etiology [hx Bleeding ulcer]- STABLE [Dr.Wohl s/p scopes]- STABLE.    # RCC s/p Right ablation-2001.  August 2021-CT scan partial evaluation; hold off imaging at this time/ Dr.Stoioff-   # Incidental findings on Imaging MARCH 2023- PET scan: Hepatic cirrhosis; Aortic atherosclerosis;.I reviewed/discussed/counseled the patient.   # DISPOSITION: #  Follow up in 6 months- MD; labs- cbc/cmp;LDH; CT scan prior-Dr.B  # I reviewed the blood work- with the patient in detail; also reviewed the imaging independently [as summarized above]; and with the patient in detail.   Cc; Dr.Gilbert .

## 2022-07-11 NOTE — Progress Notes (Signed)
How are you guys doing [place] NOTE  Patient Care Team: [name], MD as PCP - General (Unknown Physician Specialty) Wellington Hampshire, MD as PCP - Cardiology (Cardiology) Margaretha Sheffield, MD (Otolaryngology) Leandrew Koyanagi, MD as Referring Physician (Ophthalmology) Sharlotte Alamo, DPM as Consulting Physician (Podiatry) Bernardo Heater, Ronda Fairly, MD as Consulting Physician (Urology) Ree Edman, MD as Consulting Physician (Dermatology) Telford Nab, RN as Registered Nurse [name], MD as Consulting Physician (Gastroenterology) Cockfield, Deanne Coffer, PA-C (Cardiology)  CHIEF COMPLAINTS/PURPOSE OF CONSULTATION:  Mediastinal adenopathy  #  Oncology History Overview Note  #February 2020 gastroenteropathy/right supraclavicular adenopathy-incidental]-PET scan 2020-low-grade FDG activity-differential low-grade lymphoma versus benign reactive process. no biopsy  #Right kidney-cancer status post cryo [[facility]]  # 2020- colonoscopy s/p polypectomy  [Dr.Wohl-]  #CAD status post CABG; mechanical valve replacement [2019 August; [facility]]; on Coumadin     Adenocarcinoma, renal cell ([place])  06/13/2015 Initial Diagnosis   Adenocarcinoma, renal cell ([place])   Cancer of right kidney ([place])  12/10/2018 Initial Diagnosis   Cancer of right kidney ([place])     HISTORY OF PRESENTING ILLNESS: Accompanied by his wife. He is walking with a cane.    [name] [name] 88 y.o.  male is multiple co-morbidities- a.fb/mechanical valve on coumadin; compensated cirrhosis here for follow-up  re:of mediastinal/neck adenopathy/anemia review the results of the PET scan. .   Patient here today for follow up regarding lymphadenopathy. Patient denies any concerns today.  Patient had falls- sec to gait instability. Currently on PT. No significant trauma.  Continues to have memory issues.  No new shortness of breath or cough.  No weight loss.  Review of Systems  Constitutional:   Positive for malaise/fatigue and weight loss. Negative for chills, diaphoresis and fever.  HENT:  Negative for nosebleeds and sore throat.   Eyes:  Negative for double vision.  Respiratory:  Negative for cough, hemoptysis, sputum production, shortness of breath and wheezing.   Cardiovascular:  Negative for chest pain, palpitations, orthopnea and leg swelling.  Gastrointestinal:  Negative for abdominal pain, blood in stool, constipation, diarrhea, heartburn, melena, nausea and vomiting.  Genitourinary:  Negative for dysuria, frequency and urgency.  Musculoskeletal:  Negative for back pain and joint pain.  Skin: Negative.  Negative for itching and rash.  Neurological:  Positive for dizziness. Negative for tingling, focal weakness, weakness and headaches.  Endo/Heme/Allergies:  Does not bruise/bleed easily.  Psychiatric/Behavioral:  Negative for depression. The patient is not nervous/anxious and does not have insomnia.      MEDICAL HISTORY:  Past Medical History:  Diagnosis Date   Aortic stenosis    a. 01/2016 echo: mod AS; b. 04/2018 Cardiac MRI: sev AS; c. 05/2018 s/p mech AVR @ time of myomectomy; d. 07/2018 Echo: Mild AS/AI.   Atherosclerosis of aorta ([place])    by CT scan in past   Atrial fibrillation ([facility])    and aflutter. pt has a left atrial circuit that is not ablated. was on amiodarone-stopped, now use rate control.    Bladder cancer ([place])    hx; treated with BCG in past    Carotid artery disease ([place])    There was calcified plaque but no stenosis by carotid artery screening done at [facility] October, 2013   CHB (complete heart block) [facility])    a. 05/2018 s/p BSX VVIR PPM ([facility]) in setting of bradycardia following myomectomy and AVR/MVR.   Diabetes mellitus    type not specified   Diabetic neuropathy ([place])  feet   Elevated liver enzymes    over time; hx   Excessive sweating    Glaucoma    Gout    Head injury    when slipped on ice 2004-2005. stabilized and  back on Coumadin    Headache    migraines - distant past   History of cardiac cath    a. 05/2018 Cath ([facility]): Non-obstructive CAD. Mildly elevated filling pressures w/ nl CI.   HOCM (hypertrophic obstructive cardiomyopathy) ([place])    a. 01/2016 Echo: EF 65-70%, no rwma, LVOT gradient of 80-34mHg, mod AS, SAM; b. 11/2017 Echo: EF 55-60%, near cavity obliteration in systole, mod AS, mild MS; c. 04/2018 Card MRI ([facility]): Sev LVH, EF >70%, Sev AS, LVOT obs w/ Sev MR, mild to mod TR/PR, mid-myocardial HE in basal-mid anteroseptum and inferoseptum; d. 05/2018 s/p Septal myomectomy; e. 07/2018 Echo: EF 60-65%. PASP 332mg.   Homocystinemia ([place]   elevated, mild    Hypercholesterolemia    treated.    Hypertension    Infection of right inner ear    Kidney mass    a. s/p laproscopic surgery woth cryoablation of [facility]: 2cm R kidney mass.   Mediastinal adenopathy    a. 12/208 CT Chest: up to 41m8mLL lung nodule. 1.5-1.7cm subcarinal/mediastinal adenopahty/right paratracheal lymph node; b. 11/2018 CT Chest: 2.5-3cm mediastinal adenopathy.   Mitral regurgitation    a. 04/2018 Cardiac MRI: sev MR; b. 05/2018 s/p mech MVR @ time of myomectomy; c. 07/2018 Echo: Mild MS.   Motion sickness    ocean boats   Nausea    Nonobstructive coronary artery disease    a. 05/2018 Cath ([facility]): nonobs CAD.   Obstructive sleep apnea    CPAP started successfully 2014   Orthostatic hypotension    a. orthostatic. dehydration. hospitalized 11/11   Sleep apnea    Significant obstructive sleep apnea diagnosed in August, 2012, the patient is to receive CPAP    [facility] and [facility]. [place] [facility] 11/11. diagnosis was dehydration, no acutal reports.    TSH elevation    on synthroid historically    Unsteady gait    August, 2012    SURGICAL HISTORY: Past Surgical History:  Procedure Laterality Date   BLADDER SURGERY     CATARACT EXTRACTION W/PHACO  Left 05/11/2015   Procedure: CATARACT EXTRACTION PHACO AND INTRAOCULAR LENS PLACEMENT ([place] Surgeon: ChaLeandrew KoyanagiD;  [facility];  Laterality: Left;  DIABETIC - oral meds, CPAP   COLONOSCOPY WITH PROPOFOL N/A 04/14/2019   Procedure: COLONOSCOPY WITH PROPOFOL;  Surgeon: WohLucilla LameD;  [facility];  Service: Endoscopy;  Laterality: N/A;   ESOPHAGOGASTRODUODENOSCOPY (EGD) WITH PROPOFOL N/A 07/01/2018   Procedure: ESOPHAGOGASTRODUODENOSCOPY (EGD) WITH PROPOFOL;  Surgeon: WohLucilla LameD;  Location: [facility] ENDOSCOPY;  Service: Endoscopy;  Laterality: N/A;   INSERT / [place]PLACE / [place]  "froze mass"   MECHANICAL AORTIC AND MITRAL VALVE REPLACEMENT  05/2018   [facility]    TONSILLECTOMY     VALVE REPLACEMENT      SOCIAL HISTORY: Social History   Socioeconomic History   Marital status: Married    Spouse name: MarJasmine AweNumber of children: 1   Years of education: 16   Highest education level: Bachelor's degree (e.g., BA, AB, BS)  Occupational History   Occupation: retired  Tobacco Use   Smoking status: Former    Types: Cigars, Cigarettes    Quit date: 10/02/1974    Years since quitting: 47.8   Smokeless tobacco: Never   Tobacco comments:    still smokes cigars once a day  Vaping Use   Vaping Use: Never used  Substance and Sexual Activity   Alcohol use: No   Drug use: No   Sexual activity: Yes  Other Topics Concern   Not on file  Social History Narrative   Married, retired, gets regular exercise.    Social Determinants of Health   Financial Resource Strain: Low Risk  (07/13/2021)   Overall Financial Resource Strain (CARDIA)    Difficulty of Paying Living Expenses: Not hard at all  Food Insecurity: No Food Insecurity (07/13/2021)   Hunger Vital Sign    Worried About Running Out of Food in the Last Year: Never true    Ran Out of Food in the Last Year: Never true   Transportation Needs: No Transportation Needs (07/13/2021)   PRAPARE - Hydrologist (Medical): No    Lack of Transportation (Non-Medical): No  Physical Activity: Insufficiently Active (07/13/2021)   Exercise Vital Sign    Days of Exercise per Week: 4 days    Minutes of Exercise per Session: 10 min  Stress: No Stress Concern Present (07/13/2021)   [place]    Feeling of Stress : Only a little  Social Connections: Socially Integrated (07/13/2021)   Social Connection and Isolation Panel [NHANES]    Frequency of Communication with Friends and Family: Twice a week    Frequency of Social Gatherings with Friends and Family: Once a week    Attends Religious Services: More than 4 times per year    Active Member of [employer] or Organizations: Yes    Attends Music therapist: More than 4 times per year    Marital Status: Married  Human resources officer Violence: Not At Risk (07/13/2021)   Humiliation, Afraid, Rape, and Kick questionnaire    Fear of Current or Ex-Partner: No    Emotionally Abused: No    Physically Abused: No    Sexually Abused: No    FAMILY HISTORY: Family History  Problem Relation Age of Onset   Arrhythmia Father        A-Fib   Prostate cancer Father    Stroke Father    Breast cancer Mother    Arrhythmia Brother        A-Fib   Heart attack Neg Hx    Hypertension Neg Hx     ALLERGIES:  is allergic to macrolides and ketolides, mycinettes, nitrofuran derivatives, nitrofurantoin, penicillins, erythromycin, zofran [ondansetron hcl-dextrose], and zofran [ondansetron hcl].  MEDICATIONS:  Current Outpatient Medications  Medication Sig Dispense Refill   alendronate (FOSAMAX) 70 MG tablet TAKE 1 TABLET BY MOUTH ONCE A WEEK WITH  A  FULL  GLASS  OF  WATER  ON  AN  EMPTY  STOMACH 12 tablet 0   amoxicillin (AMOXIL) 500 MG tablet Take 1 tablet (500 mg total) by mouth as needed (TAKE  AS NEEDED FOR ANY DENTAL PROCEDURES). Take 4 tablets 30-60 minutes prior to any dental work 4 tablet 1   aspirin EC 81 MG tablet Take 1 tablet (81 mg total) by mouth daily.     atorvastatin (LIPITOR) 40 MG tablet TAKE 1 TABLET BY MOUTH ONCE DAILY . APPOINTMENT  REQUIRED FOR FUTURE REFILLS 90 tablet 0   latanoprost (XALATAN) 0.005 % ophthalmic solution Place 1 drop into both eyes at bedtime.      levothyroxine (SYNTHROID) 50 MCG tablet Take 1 tablet by mouth once daily 90 tablet 3   metoprolol tartrate (LOPRESSOR) 25 MG tablet TAKE 1/2 (ONE-HALF) TABLET BY MOUTH TWICE DAILY. 90 tablet 2   pantoprazole (PROTONIX) 40 MG tablet Take 1 tablet by mouth twice daily 180 tablet 3   warfarin (COUMADIN) 3 MG tablet TAKE 1 TO 1.5 TABLETS BY MOUTH ONCE DAILY AS DIRECTED 45 tablet 2   No current facility-administered medications for this visit.      [name]  PHYSICAL EXAMINATION: ECOG PERFORMANCE STATUS: 0 - Asymptomatic  Vitals:   07/11/22 1413  BP: (!) 147/97  Pulse: 71  Resp: 18  Temp: 97.9 F (36.6 C)   Filed Weights   07/11/22 1413  Weight: 167 lb (75.8 kg)    Physical Exam HENT:     Head: Normocephalic and atraumatic.     Mouth/Throat:     Pharynx: No oropharyngeal exudate.  Eyes:     Pupils: Pupils are equal, round, and reactive to light.  Cardiovascular:     Rate and Rhythm: Normal rate and regular rhythm.  Pulmonary:     Effort: Pulmonary effort is normal. No respiratory distress.     Breath sounds: Normal breath sounds. No wheezing.  Abdominal:     General: Bowel sounds are normal. There is no distension.     Palpations: Abdomen is soft. There is no mass.     Tenderness: There is no abdominal tenderness. There is no guarding or rebound.  Musculoskeletal:        General: No tenderness. Normal range of motion.     Cervical back: Normal range of motion and neck supple.  Skin:    General: Skin is warm.  Neurological:     Mental Status: He is alert and oriented to person, place,  and time.  Psychiatric:        Mood and Affect: Affect normal.      LABORATORY DATA:  I have reviewed the data as listed Lab Results  Component Value Date   WBC 8.8 07/11/2022   HGB 13.1 07/11/2022   HCT 39.3 07/11/2022   MCV 92.7 07/11/2022   PLT 168 07/11/2022   Recent Labs    08/09/21 1841 11/15/21 1521 12/15/21 1322 07/11/22 1341  NA 138 140 136 138  K 4.3 4.6 3.9 4.4  CL 104 102 102 102  CO2 '27 26 27 31  '$ GLUCOSE 98 94 183* 176*  BUN '14 15 15 16  '$ CREATININE 0.84 0.83 0.91 0.96  CALCIUM 8.8* 9.8 9.1 9.2  GFRNONAA >60  --  >60 >60  PROT  --  7.5 8.0 8.1  ALBUMIN  --  4.5 4.0 4.1  AST  --  25 26 35  ALT  --  '14 15 30  '$ ALKPHOS  --  105 75 93  BILITOT  --  0.7 0.4 0.9    RADIOGRAPHIC STUDIES: I have personally reviewed the radiological images as listed and agreed with the findings in the report. No results found.   ASSESSMENT & PLAN:   Mediastinal lymphadenopathy # Mediastinal Right paratracheal  LN/right supraclavicular adenopathy 1-2 centimeters lymph node. [Since December 2018]. CT scan FEB 2023 [compared to Feb 2022]-left upper lobe- increasingly solid appearance of an area within geographic ground-glass changes- ?  Slow-growing bronchogenic neoplasm.  MARCH 2023-PET scan:  Mixed  attenuation anterior left upper lobe lesion of concern on recent diagnostic chest CT shows no substantial hypermetabolism; While reassuring, low-grade or well differentiated neoplasm can be poorly FDG avid and continued close attention recommended; Stable mediastinal lymphadenopathy with low level FDG accumulation comparing back to 06/09/2019. Given lack of appreciable change, this suggest benign/reactive etiology;   I again reassured the patient/wife that this new lesion does not seem to be life-threatening in the context of patient's age and multiple comorbidities. Will repeat CT non-contrast chest.   #History of A-fib on-Coumadin/valve- [Dr.Arida]-incidental left atrial thrombus  noted on CT imaging; discussed with Dr. Fletcher Anon- STABLE.   # Anemia/Cirhosis- 15 ? Etiology [hx Bleeding ulcer]- STABLE [Dr.Wohl s/p scopes]- STABLE.    # RCC s/p Right ablation-2001.  August 2021-CT scan partial evaluation; hold off imaging at this time/ Dr.Stoioff-   # Incidental findings on Imaging MARCH 2023- PET scan: Hepatic cirrhosis; Aortic atherosclerosis;.I reviewed/discussed/counseled the patient.   # DISPOSITION: #  Follow up in 6 months- MD; labs- cbc/cmp;LDH; CT scan prior-Dr.B  # I reviewed the blood work- with the patient in detail; also reviewed the imaging independently [as summarized above]; and with the patient in detail.   Cc; Dr.Gilbert .  All questions were answered. The patient knows to call the clinic with any problems, questions or concerns.    Cammie Sickle, MD 07/11/2022 2:41 PM

## 2022-07-11 NOTE — Patient Instructions (Signed)
HOLD TODAY AND THURSDAY and then Continue 1.5 tablets every day EXCEPT 1 tablet on [place].  Recheck INR in 1 week.

## 2022-07-12 ENCOUNTER — Encounter: Payer: Self-pay | Admitting: Physical Therapy

## 2022-07-12 ENCOUNTER — Ambulatory Visit: Payer: PPO | Admitting: Physical Therapy

## 2022-07-12 DIAGNOSIS — R262 Difficulty in walking, not elsewhere classified: Secondary | ICD-10-CM

## 2022-07-12 DIAGNOSIS — R2681 Unsteadiness on feet: Secondary | ICD-10-CM

## 2022-07-12 DIAGNOSIS — M6281 Muscle weakness (generalized): Secondary | ICD-10-CM

## 2022-07-12 DIAGNOSIS — R2689 Other abnormalities of gait and mobility: Secondary | ICD-10-CM

## 2022-07-12 NOTE — Therapy (Signed)
OUTPATIENT PHYSICAL THERAPY treatment note     Patient Name: [name] [name] MRN: [id number] DOB:04/29/1934, 88 y.o., male Today's Date: 07/12/2022  PCP: [name], MD REFERRING PROVIDER: [name], MD   PT End of Session - 07/12/22 1012     Visit Number 22    Number of Visits 84    Date for PT Re-Evaluation 09/04/22    Authorization Type [employer]    Authorization Time Period -09/04/22    Progress Note Due on Visit 30    PT Start Time 1015    PT Stop Time 1058    PT Time Calculation (min) 43 min    Equipment Utilized During Treatment Gait belt    Activity Tolerance Patient tolerated treatment well    Behavior During Therapy [facility] for tasks assessed/performed                        Past Medical History:  Diagnosis Date   Aortic stenosis    a. 01/2016 echo: mod AS; b. 04/2018 Cardiac MRI: sev AS; c. 05/2018 s/p mech AVR @ time of myomectomy; d. 07/2018 Echo: Mild AS/AI.   Atherosclerosis of aorta ([place])    by CT scan in past   Atrial fibrillation (HCC)    and aflutter. pt has a left atrial circuit that is not ablated. was on amiodarone-stopped, now use rate control.    Bladder cancer ([place])    hx; treated with BCG in past    Carotid artery disease ([place])    There was calcified plaque but no stenosis by carotid artery screening done at [facility] October, 2013   CHB (complete heart block) [facility])    a. 05/2018 s/p BSX VVIR PPM ([facility]) in setting of bradycardia following myomectomy and AVR/MVR.   Diabetes mellitus    type not specified   Diabetic neuropathy ([place])    feet   Elevated liver enzymes    over time; hx   Excessive sweating    Glaucoma    Gout    Head injury    when slipped on ice 2004-2005. stabilized and back on Coumadin    Headache    migraines - distant past   History of cardiac cath    a. 05/2018 Cath ([facility]): Non-obstructive CAD. Mildly elevated filling pressures w/ nl CI.   HOCM (hypertrophic  obstructive cardiomyopathy) ([place])    a. 01/2016 Echo: EF 65-70%, no rwma, LVOT gradient of 80-57mHg, mod AS, SAM; b. 11/2017 Echo: EF 55-60%, near cavity obliteration in systole, mod AS, mild MS; c. 04/2018 Card MRI ([facility]): Sev LVH, EF >70%, Sev AS, LVOT obs w/ Sev MR, mild to mod TR/PR, mid-myocardial HE in basal-mid anteroseptum and inferoseptum; d. 05/2018 s/p Septal myomectomy; e. 07/2018 Echo: EF 60-65%. PASP 342mg.   Homocystinemia ([place]   elevated, mild    Hypercholesterolemia    treated.    Hypertension    Infection of right inner ear    Kidney mass    a. s/p laproscopic surgery woth cryoablation of a [facility]: 2cm R kidney mass.   Mediastinal adenopathy    a. 12/208 CT Chest: up to 14m80mLL lung nodule. 1.5-1.7cm subcarinal/mediastinal adenopahty/right paratracheal lymph node; b. 11/2018 CT Chest: 2.5-3cm mediastinal adenopathy.   Mitral regurgitation    a. 04/2018 Cardiac MRI: sev MR; b. 05/2018 s/p mech MVR @ time of myomectomy; c. 07/2018 Echo: Mild MS.  Motion sickness    ocean boats   Nausea    Nonobstructive coronary artery disease    a. 05/2018 Cath ([facility]): nonobs CAD.   Obstructive sleep apnea    CPAP started successfully 2014   Orthostatic hypotension    a. orthostatic. dehydration. hospitalized 11/11   Sleep apnea    Significant obstructive sleep apnea diagnosed in August, 2012, the patient is to receive CPAP    Stroke [facility])    "2 old strokes" CT and MRI. [facility] 11/11. diagnosis was dehydration, no acutal reports.    TSH elevation    on synthroid historically    Unsteady gait    August, 2012   Past Surgical History:  Procedure Laterality Date   BLADDER SURGERY     CATARACT EXTRACTION W/PHACO Left 05/11/2015   Procedure: CATARACT EXTRACTION PHACO AND INTRAOCULAR LENS PLACEMENT ([place]);  Surgeon: Leandrew Koyanagi, MD;  Location: [place];  Service: Ophthalmology;  Laterality: Left;  DIABETIC - oral  meds, CPAP   COLONOSCOPY WITH PROPOFOL N/A 04/14/2019   Procedure: COLONOSCOPY WITH PROPOFOL;  Surgeon: [name], MD;  Location: [facility] ENDOSCOPY;  Service: Endoscopy;  Laterality: N/A;   ESOPHAGOGASTRODUODENOSCOPY (EGD) WITH PROPOFOL N/A 07/01/2018   Procedure: ESOPHAGOGASTRODUODENOSCOPY (EGD) WITH PROPOFOL;  Surgeon: [name], MD;  Location: [facility] ENDOSCOPY;  Service: Endoscopy;  Laterality: N/A;   INSERT / REPLACE / [place]     "froze mass"   MECHANICAL AORTIC AND MITRAL VALVE REPLACEMENT  05/2018   [facility]    TONSILLECTOMY     VALVE REPLACEMENT     Patient Active Problem List   Diagnosis Date Noted   Other abnormalities of gait and mobility 11/15/2021   Depression 11/15/2021   Encounter for fitting and adjustment of hearing aid 11/15/2021   Abnormal CT scan    Abnormal findings on radiological examination of gastrointestinal tract    Polyp of colon    Cancer of right kidney ([place]) 12/10/2018   Pulmonary nodules 12/01/2018   Mediastinal lymphadenopathy 12/01/2018   Heart block AV third degree ([place]) 10/07/2018   Pacemaker 10/07/2018   H/O aortic valve replacement 10/07/2018   Blood in stool    Bleeding duodenal ulcer    GIB (gastrointestinal bleeding) 06/30/2018   Acute upper GI bleed    Decreased activities of daily living (ADL) 06/09/2018   Decreased strength, endurance, and mobility 06/09/2018   Pleural effusion, right 06/09/2018   S/P AVR (aortic valve replacement) 06/08/2018   H/O ventricular septal myectomy 06/08/2018   Chronic anticoagulation 06/02/2018   Heart murmur, systolic 33/00/7622   H/O: rheumatic fever 03/21/2017   Tinnitus 10/30/2016   On warfarin therapy 07/06/2015   Arteriosclerosis of coronary artery 06/13/2015   Carcinoma in situ of bladder 06/13/2015   Diabetes mellitus, type 2 ([place]) 06/13/2015   Diabetic neuropathy ([place]) 06/13/2015   Dizziness 06/13/2015   Essential (primary) hypertension  06/13/2015   Fatigue 06/13/2015   Glaucoma 06/13/2015   Hypercholesteremia 06/13/2015   Male hypogonadism 06/13/2015   Adult hypothyroidism 06/13/2015   Arthritis, degenerative 06/13/2015   CAP (community acquired pneumonia) 06/13/2015   Adenocarcinoma, renal cell ([place]) 06/13/2015   Benign head tremor 12/13/2014   Carotid arterial disease ([place]) 07/12/2014   Decreased cardiac output 07/12/2014   Cardiomyopathy, hypertrophic obstructive ([place]) 07/12/2014   Head injuries 07/12/2014   HOCM (hypertrophic obstructive cardiomyopathy) ([place])    Encounter for therapeutic drug monitoring 11/16/2013   Obstructive sleep apnea  Benign prostatic hypertrophy without urinary obstruction 06/09/2012   History of neoplasm of bladder 06/09/2012   Head injury    Atherosclerosis of aorta (HCC)    Hypotension    Stroke (HCC)    Unsteady gait    Excessive sweating    Nausea    ORTHOSTATIC HYPOTENSION, HX OF 09/01/2010   Overweight 03/07/2009   MITRAL REGURGITATION 03/05/2009   Chronic atrial fibrillation ([place]) 03/05/2009   Atrial fibrillation and flutter ([place]) 03/05/2009   Mitral and aortic incompetence 03/05/2009      REFERRING DIAG: Imbalance  THERAPY DIAG:  Difficulty in walking, not elsewhere classified  Other abnormalities of gait and mobility  Unsteadiness on feet  Muscle weakness (generalized)  Rationale for Evaluation and Treatment Rehabilitation  PERTINENT HISTORY:  Pt report she has been having balance issues secondary to his ears. Pt was in the artilerary. Pt sold a home in april and has fallen 5 times since then. Pt tripped over a piece of wood in back yard and fell on one occassion. Pt has history of cardiac rehab [facility] at [facility]. Pt has a very difficult time with balance without his cane. He has difficulty with balance when he turns his head.   PRECAUTIONS: Fall  SUBJECTIVE: Pt reports having a fall last night when trying to pick up his cpap cap when it fell to the floor.   Patient caregiver also reports she had to call her son-in-law in order to assist patient with getting off of the floor.  Pt caregiver reports coumadin was thin when it was checked yesterday. Pt reports visit at cancer center went well with no new cancer diagnosis found.   Presents to therapy with his wife; Tiffany Kocher  PAIN:  Are you having pain? No     TODAY'S TREATMENT: 07/12/22 Completed BERG balance test to continue with progress note assessments from prior session.      Exercise/Activity Sets/Reps/Time/ Resistance Assistance Charge type Comments .=Unless otherwise stated, CGA was provided and gait belt donned in order to ensure pt safety    Standing single-leg stance progression.  1 lower extremity on floor 1 lower extremity on hedgehog's 2 x 45 seconds each leg with step anterior and 2 x 45 seconds with step located laterally to the patient CGA  NMR Cues for proper form   Anterior taps to 6 inch step plus hedgehog with upper extremity support X20 reps each lower extremity followed by lateral steps x20 steps each lower extremity working on lower extremity but stability as well as foot coordination CGA  NMR Working on single leg balance and LE coordination and balance throughout. Great balance and increased shot power and accuracy this session indicating improved coordination and single leg balance ability. Pt also enjoys intervention and finds it challenging.    STS with airex pad and no UE support  1*8 reps with no UE assist  CGA, TA Improved form with no need for UE assist, cues for full upright stance and anterior weight shift.   Ambulaiton with 1.5# AW with intermittent retro ambulation and with cues for increasing speed  *200 feet, alternating with STS  No AD  TA 2 LOB with right turns due to narrow stepping and losing balance on first bout, on second bout pt challenged with color coated tiles and external focus to keep feet apart.   Treatment Provided this session   Pt educated  throughout session about proper posture and technique with exercises. Improved exercise technique, movement at target joints, use of target muscles  after min to mod verbal, visual, tactile cues.  Note: Portions of this document were prepared using Dragon voice recognition software and although reviewed may contain unintentional dictation errors in syntax, grammar, or spelling.           PATIENT EDUCATION: Education details: Education regarding when to use of EMS services and education regarding what the services entail the patient is to have a fall. Person educated: Patient Education method: Explanation, Demonstration, Tactile cues, and Verbal cues Education comprehension: verbalized understanding, returned demonstration, and verbal cues required   HOME EXERCISE PROGRAM:  Access Code: [email or web address] URL: [email or web address] Date: 04/26/2022 Prepared by: Chapman Nation  Exercises - Standing March with Counter Support  - 1 x daily - 7 x weekly - 3 sets - 10 reps - Standing Knee Flexion with Unilateral Counter Support  - 1 x daily - 7 x weekly - 3 sets - 10 reps - Heel Toe Raises with Unilateral Counter Support  - 1 x daily - 7 x weekly - 3 sets - 10 reps - Standing Hip Abduction with Unilateral Counter Support  - 1 x daily - 7 x weekly - 3 sets - 10 reps - Standing Hip Extension with Unilateral Counter Support  - 1 x daily - 7 x weekly - 3 sets - 10 reps   PT Short Term Goals        PT SHORT TERM GOAL #1   Title Patient will be independent in home exercise program to improve strength/mobility for better functional independence with ADLs.    Baseline no HEP 8/15: Not completing regularly, lots of discussion about importance of comliance    Time 4    Period Weeks    Status Progressing    Target Date 04/16/22              PT Long Term Goals       PT LONG TERM GOAL #1   Title Patient will increase FOTO score to equal to or greater than   48  to demonstrate  statistically significant improvement in mobility and quality of life.    Baseline 42 9/19:54   Time 8    Period Weeks    Status Met    Target Date 06/11/22      PT LONG TERM GOAL #2   Title Patient  will complete five times sit to stand test in < 17 seconds indicating an increased LE strength and improved balance.    Baseline 23.53 sec 8/15: 17 sec heavy UE use  9/19: 15.8 sec   Time 12    Period Weeks    Status Met          PT LONG TERM GOAL #3   Title Patient will increase Berg Balance score by > 6 points to demonstrate decreased fall risk during functional activities.    Baseline 34,  8/15:38 9/19: test next session 9/21:43   Time 12    Period Weeks    Status Partially met       PT LONG TERM GOAL #4   Title Patient will increase six minute walk test distance to >1000 for progression to community ambulator and improve gait ability    Baseline 725 feet 9/19: 745 feet without AD    Time 12    Period Weeks    Status Partially met     Target Date 09/04/22      PT LONG TERM GOAL #5   Title Patient will increase 10 meter walk test to >1.3ms  as to improve gait speed for better community ambulation and to reduce fall risk.    Baseline .58 m/s 9/19: .65 m/s   Time 12    Period Weeks    Status Ongoing     Target Date 09/04/22                    Plan     Clinical Impression Statement  Continued with current plan of care as laid out in evaluation and recent prior sessions. Pt remains motivated to advance progress toward goals in order to maximize independence and safety at home. Pt requires high level assistance and cuing for completion of exercises in order to provide adequate level of stimulation and perturbation. Author allows pt as much opportunity as possible to perform independent righting strategies, only stepping in when pt is unable to prevent falling to floor.  Physical therapy spent ample time educating patient and patient caregiver regarding steps to take if  patient has a fall.  We will consider activities involving floor to stand transfers as patient is able and as is indicated in future sessions to prevent stressful situations of getting on and off of the floor.  Pt continues to demonstrate progress toward goals AEB progression of some interventions this date either in volume or intensity.         Personal Factors and Comorbidities Age;Comorbidity 1;Comorbidity 2;Comorbidity 3+    Comorbidities CAD, Peripheral neuropathy, T2DM, HTN,    Examination-Activity Limitations Caring for Others;Carry;Locomotion Level;Stairs;Stand    Examination-Participation Restrictions Yard Work;Shop;Community Activity    Stability/Clinical Decision Making Evolving/Moderate complexity    Rehab Potential Fair    PT Frequency 2x / week    PT Duration 12 weeks    PT Treatment/Interventions ADLs/Self Care Home Management;Moist Heat;DME Instruction;Stair training;Functional mobility training;Therapeutic activities;Therapeutic exercise;Balance training;Neuromuscular re-education;Patient/family education;Wheelchair mobility training;Manual techniques;Dry needling;Energy conservation    PT Next Visit Plan test 6MWT and 10MWT, also establish HEP for 2 weeks pt will be at [place] Provided visit 2    Consulted and Agree with Plan of Care Patient;Family member/caregiver                Particia Lather PT, DPT  07/12/22 1:34 PM   07/12/22, 1:34 PM

## 2022-07-16 DIAGNOSIS — Z9181 History of falling: Secondary | ICD-10-CM | POA: Diagnosis not present

## 2022-07-16 DIAGNOSIS — I872 Venous insufficiency (chronic) (peripheral): Secondary | ICD-10-CM | POA: Diagnosis not present

## 2022-07-16 DIAGNOSIS — Z515 Encounter for palliative care: Secondary | ICD-10-CM | POA: Diagnosis not present

## 2022-07-16 DIAGNOSIS — Z7901 Long term (current) use of anticoagulants: Secondary | ICD-10-CM | POA: Diagnosis not present

## 2022-07-16 DIAGNOSIS — Z952 Presence of prosthetic heart valve: Secondary | ICD-10-CM | POA: Diagnosis not present

## 2022-07-17 ENCOUNTER — Encounter: Payer: Self-pay | Admitting: Physical Therapy

## 2022-07-17 ENCOUNTER — Ambulatory Visit (INDEPENDENT_AMBULATORY_CARE_PROVIDER_SITE_OTHER): Payer: PPO | Admitting: Family Medicine

## 2022-07-17 ENCOUNTER — Encounter: Payer: Self-pay | Admitting: Family Medicine

## 2022-07-17 ENCOUNTER — Ambulatory Visit: Payer: PPO | Attending: Family Medicine | Admitting: Physical Therapy

## 2022-07-17 DIAGNOSIS — R2681 Unsteadiness on feet: Secondary | ICD-10-CM | POA: Diagnosis not present

## 2022-07-17 DIAGNOSIS — R2689 Other abnormalities of gait and mobility: Secondary | ICD-10-CM | POA: Diagnosis not present

## 2022-07-17 DIAGNOSIS — R262 Difficulty in walking, not elsewhere classified: Secondary | ICD-10-CM | POA: Diagnosis not present

## 2022-07-17 DIAGNOSIS — M6281 Muscle weakness (generalized): Secondary | ICD-10-CM | POA: Insufficient documentation

## 2022-07-17 DIAGNOSIS — Z23 Encounter for immunization: Secondary | ICD-10-CM | POA: Diagnosis not present

## 2022-07-17 NOTE — Therapy (Signed)
OUTPATIENT PHYSICAL THERAPY treatment note     Patient Name: [name] [name] MRN: [id number] DOB:[birth year], 88 y.o., male Today's Date: 07/17/2022  PCP: [name], MD REFERRING PROVIDER: [name], MD   PT End of Session - 07/17/22 1105     Visit Number 23    Number of Visits 65    Date for PT Re-Evaluation 09/04/22    Authorization Type [employer]    Authorization Time Period -09/04/22    Progress Note Due on Visit 30    PT Start Time 1102    PT Stop Time 1143    PT Time Calculation (min) 41 min    Equipment Utilized During Treatment Gait belt    Activity Tolerance Patient tolerated treatment well    Behavior During Therapy [facility] for tasks assessed/performed                         Past Medical History:  Diagnosis Date   Aortic stenosis    a. 01/2016 echo: mod AS; b. 04/2018 Cardiac MRI: sev AS; c. 05/2018 s/p mech AVR @ time of myomectomy; d. 07/2018 Echo: Mild AS/AI.   Atherosclerosis of aorta ([place])    by CT scan in past   Atrial fibrillation (HCC)    and aflutter. pt has a left atrial circuit that is not ablated. was on amiodarone-stopped, now use rate control.    Bladder cancer ([place])    hx; treated with BCG in past    Carotid artery disease ([place])    There was calcified plaque but no stenosis by carotid artery screening done at [facility] October, 2013   CHB (complete heart block) [facility])    a. 05/2018 s/p BSX VVIR PPM ([facility]) in setting of bradycardia following myomectomy and AVR/MVR.   Diabetes mellitus    type not specified   Diabetic neuropathy ([place])    feet   Elevated liver enzymes    over time; hx   Excessive sweating    Glaucoma    Gout    Head injury    when slipped on ice 2004-2005. stabilized and back on Coumadin    Headache    migraines - distant past   History of cardiac cath    a. 05/2018 Cath ([facility]): Non-obstructive CAD. Mildly elevated filling pressures w/ nl CI.   HOCM (hypertrophic  obstructive cardiomyopathy) ([place])    a. 01/2016 Echo: EF 65-70%, no rwma, LVOT gradient of 80-25mHg, mod AS, SAM; b. 11/2017 Echo: EF 55-60%, near cavity obliteration in systole, mod AS, mild MS; c. 04/2018 Card MRI ([facility]): Sev LVH, EF >70%, Sev AS, LVOT obs w/ Sev MR, mild to mod TR/PR, mid-myocardial HE in basal-mid anteroseptum and inferoseptum; d. 05/2018 s/p Septal myomectomy; e. 07/2018 Echo: EF 60-65%. PASP 327mg.   Homocystinemia ([facility]   elevated, mild    Hypercholesterolemia    treated.    Hypertension    Infection of right inner ear    Kidney mass    a. s/p laproscopic surgery woth cryoablation of a mass outside kidney followed at [facility]. 11/2018 CT Chest: 2cm R kidney mass.   Mediastinal adenopathy    a. 12/208 CT Chest: up to 30m330mLL lung nodule. 1.5-1.7cm subcarinal/mediastinal adenopahty/right paratracheal lymph node; b. 11/2018 CT Chest: 2.5-3cm mediastinal adenopathy.   Mitral regurgitation    a. 04/2018 Cardiac MRI: sev MR; b. 05/2018 s/p mech MVR @ time of myomectomy; c. 07/2018 Echo: Mild MS.  Motion sickness    ocean boats   Nausea    Nonobstructive coronary artery disease    a. 05/2018 Cath ([facility]): nonobs CAD.   Obstructive sleep apnea    CPAP started successfully 2014   Orthostatic hypotension    a. orthostatic. dehydration. hospitalized 11/11   Sleep apnea    Significant obstructive sleep apnea diagnosed in August, 2012, the patient is to receive CPAP    Stroke [facility])    "2 old strokes" CT and MRI. [facility] 11/11. diagnosis was dehydration, no acutal reports.    TSH elevation    on synthroid historically    Unsteady gait    August, 2012   Past Surgical History:  Procedure Laterality Date   BLADDER SURGERY     CATARACT EXTRACTION W/PHACO Left 05/11/2015   Procedure: CATARACT EXTRACTION PHACO AND INTRAOCULAR LENS PLACEMENT ([place]);  Surgeon: Leandrew Koyanagi, MD;  Location: [place];  Service: Ophthalmology;  Laterality: Left;  DIABETIC - oral  meds, CPAP   COLONOSCOPY WITH PROPOFOL N/A 04/14/2019   Procedure: COLONOSCOPY WITH PROPOFOL;  Surgeon: [name], MD;  Location: [facility] ENDOSCOPY;  Service: Endoscopy;  Laterality: N/A;   ESOPHAGOGASTRODUODENOSCOPY (EGD) WITH PROPOFOL N/A 07/01/2018   Procedure: ESOPHAGOGASTRODUODENOSCOPY (EGD) WITH PROPOFOL;  Surgeon: [name], MD;  Location: [facility] ENDOSCOPY;  Service: Endoscopy;  Laterality: N/A;   INSERT / REPLACE / [place]     "froze mass"   MECHANICAL AORTIC AND MITRAL VALVE REPLACEMENT  05/2018   [facility]    TONSILLECTOMY     VALVE REPLACEMENT     Patient Active Problem List   Diagnosis Date Noted   Other abnormalities of gait and mobility 11/15/2021   Depression 11/15/2021   Encounter for fitting and adjustment of hearing aid 11/15/2021   Abnormal CT scan    Abnormal findings on radiological examination of gastrointestinal tract    Polyp of colon    Cancer of right kidney ([place]) 12/10/2018   Pulmonary nodules 12/01/2018   Mediastinal lymphadenopathy 12/01/2018   Heart block AV third degree ([place]) 10/07/2018   Pacemaker 10/07/2018   H/O aortic valve replacement 10/07/2018   Blood in stool    Bleeding duodenal ulcer    GIB (gastrointestinal bleeding) 06/30/2018   Acute upper GI bleed    Decreased activities of daily living (ADL) 06/09/2018   Decreased strength, endurance, and mobility 06/09/2018   Pleural effusion, right 06/09/2018   S/P AVR (aortic valve replacement) 06/08/2018   H/O ventricular septal myectomy 06/08/2018   Chronic anticoagulation 06/02/2018   Heart murmur, systolic 33/00/7622   H/O: rheumatic fever 03/21/2017   Tinnitus 10/30/2016   On warfarin therapy 07/06/2015   Arteriosclerosis of coronary artery 06/13/2015   Carcinoma in situ of bladder 06/13/2015   Diabetes mellitus, type 2 ([place]) 06/13/2015   Diabetic neuropathy ([place]) 06/13/2015   Dizziness 06/13/2015   Essential (primary) hypertension  06/13/2015   Fatigue 06/13/2015   Glaucoma 06/13/2015   Hypercholesteremia 06/13/2015   Male hypogonadism 06/13/2015   Adult hypothyroidism 06/13/2015   Arthritis, degenerative 06/13/2015   CAP (community acquired pneumonia) 06/13/2015   Adenocarcinoma, renal cell ([place]) 06/13/2015   Benign head tremor 12/13/2014   Carotid arterial disease ([place]) 07/12/2014   Decreased cardiac output 07/12/2014   Cardiomyopathy, hypertrophic obstructive ([place]) 07/12/2014   Head injuries 07/12/2014   HOCM (hypertrophic obstructive cardiomyopathy) ([place])    Encounter for therapeutic drug monitoring 11/16/2013   Obstructive sleep apnea  Benign prostatic hypertrophy without urinary obstruction 06/09/2012   History of neoplasm of bladder 06/09/2012   Head injury    Atherosclerosis of aorta (HCC)    Hypotension    Stroke (HCC)    Unsteady gait    Excessive sweating    Nausea    ORTHOSTATIC HYPOTENSION, HX OF 09/01/2010   Overweight 03/07/2009   MITRAL REGURGITATION 03/05/2009   Chronic atrial fibrillation ([place]) 03/05/2009   Atrial fibrillation and flutter ([place]) 03/05/2009   Mitral and aortic incompetence 03/05/2009      REFERRING DIAG: Imbalance  THERAPY DIAG:  Difficulty in walking, not elsewhere classified  Other abnormalities of gait and mobility  Unsteadiness on feet  Muscle weakness (generalized)  Rationale for Evaluation and Treatment Rehabilitation  PERTINENT HISTORY:  Pt report she has been having balance issues secondary to his ears. Pt was in the artilerary. Pt sold a home in april and has fallen 5 times since then. Pt tripped over a piece of wood in back yard and fell on one occassion. Pt has history of cardiac rehab here at [facility]. Pt has a very difficult time with balance without his cane. He has difficulty with balance when he turns his head.   PRECAUTIONS: Fall  SUBJECTIVE: Pt reports no significant changes since last session and overall has been doing well.  Presents to  therapy with his wife; Tiffany Kocher  PAIN:  Are you having pain? No     TODAY'S TREATMENT: 07/17/22  Exercise/Activity Sets/Reps/Time/ Resistance Assistance Charge type Comments  Precore leg press  10 x 55#  TE SOB noted by pt, pt encouraged and instructed in pursed lip breathing, 02 improved from 93 to 96%  STS with airex pad and no UE support  1*8 reps with no UE assist  1*5 reps UE on knees  CGA, TA Improved form with no need for UE assist, cues for full upright stance and anterior weight shift.   Ambulation with 2x4 between feet in // bars working on foot spacing with ambulation  2 sets of 5 laps with anterior and retro ambulation  CGA, NO UE   No UE assist, good balnce response throughut, improved foot clearance this session, did get closer to step throuhgout and required cues for proper positioning of feet   Standing on airex reaching and picking up cones       Rocker board rocks a/p  2X 20  CGA only, intermittent UE assist with post LOB  TE To improve A/P balance reactions   Ambulaiton with 1.5# AW with intermittent retro ambulation and with cues for increasing speed   1*200 feet with intermittent retro ambulation  No AD  TA LOB with retro ambulation and with transition due to NBOS, cues did not ipmrove NBOS balance reaction strategy   Standing functional squat on airex to pick up cones on 24 inch step anteriorly  2 x 9 cones  CGA  TA No LOB but LE trembling noted.   Treatment Provided this session   Pt educated throughout session about proper posture and technique with exercises. Improved exercise technique, movement at target joints, use of target muscles after min to mod verbal, visual, tactile cues. Note: Portions of this document were prepared using Dragon voice recognition software and although reviewed may contain unintentional dictation errors in syntax, grammar, or spelling.             PATIENT EDUCATION: Education details: Education regarding when to use of EMS  services and education regarding what the services entail  the patient is to have a fall. Person educated: Patient Education method: Explanation, Demonstration, Tactile cues, and Verbal cues Education comprehension: verbalized understanding, returned demonstration, and verbal cues required   HOME EXERCISE PROGRAM:  Access Code: [place] URL: [email or web address] Date: 04/26/2022 Prepared by: [place]  Exercises - Standing March with Counter Support  - 1 x daily - 7 x weekly - 3 sets - 10 reps - Standing Knee Flexion with Unilateral Counter Support  - 1 x daily - 7 x weekly - 3 sets - 10 reps - Heel Toe Raises with Unilateral Counter Support  - 1 x daily - 7 x weekly - 3 sets - 10 reps - Standing Hip Abduction with Unilateral Counter Support  - 1 x daily - 7 x weekly - 3 sets - 10 reps - Standing Hip Extension with Unilateral Counter Support  - 1 x daily - 7 x weekly - 3 sets - 10 reps   PT Short Term Goals        PT SHORT TERM GOAL #1   Title Patient will be independent in home exercise program to improve strength/mobility for better functional independence with ADLs.    Baseline no HEP 8/15: Not completing regularly, lots of discussion about importance of comliance    Time 4    Period Weeks    Status Progressing    Target Date 04/16/22              PT Long Term Goals       PT LONG TERM GOAL #1   Title Patient will increase FOTO score to equal to or greater than   48  to demonstrate statistically significant improvement in mobility and quality of life.    Baseline 42 9/19:54   Time 8    Period Weeks    Status Met    Target Date 06/11/22      PT LONG TERM GOAL #2   Title Patient  will complete five times sit to stand test in < 17 seconds indicating an increased LE strength and improved balance.    Baseline 23.53 sec 8/15: 17 sec heavy UE use  9/19: 15.8 sec   Time 12    Period Weeks    Status Met          PT LONG TERM GOAL #3   Title Patient  will increase Berg Balance score by > 6 points to demonstrate decreased fall risk during functional activities.    Baseline 34,  8/15:38 9/19: test next session 9/21:43   Time 12    Period Weeks    Status Partially met       PT LONG TERM GOAL #4   Title Patient will increase six minute walk test distance to >1000 for progression to community ambulator and improve gait ability    Baseline 725 feet 9/19: 745 feet without AD    Time 12    Period Weeks    Status Partially met     Target Date 09/04/22      PT LONG TERM GOAL #5   Title Patient will increase 10 meter walk test to >1.39ms as to improve gait speed for better community ambulation and to reduce fall risk.    Baseline .58 m/s 9/19: .65 m/s   Time 12    Period Weeks    Status Ongoing     Target Date 09/04/22  Plan     Clinical Impression Statement  Continued with current plan of care as laid out in evaluation and recent prior sessions. Pt remains motivated to advance progress toward goals in order to maximize independence and safety at home. Pt requires high level assistance and cuing for completion of exercises in order to provide adequate level of stimulation and perturbation. Author allows pt as much opportunity as possible to perform independent righting strategies, only stepping in when pt is unable to prevent falling to floor.  P continue with lower extremity strengthening as well as functional balance activities.  Began functional squatting on unstable surface to work on patient's ability to pick items up from the services without loss of balance.  Patient does continue to show significant lower extremity weakness as evidenced by difficulty with leg press as well as sit to stand without upper extremity support.  Pt continues to demonstrate progress toward goals AEB progression of some interventions this date either in volume or intensity.         Personal Factors and Comorbidities Age;Comorbidity  1;Comorbidity 2;Comorbidity 3+    Comorbidities CAD, Peripheral neuropathy, T2DM, HTN,    Examination-Activity Limitations Caring for Others;Carry;Locomotion Level;Stairs;Stand    Examination-Participation Restrictions Yard Work;Shop;Community Activity    Stability/Clinical Decision Making Evolving/Moderate complexity    Rehab Potential Fair    PT Frequency 2x / week    PT Duration 12 weeks    PT Treatment/Interventions ADLs/Self Care Home Management;Moist Heat;DME Instruction;Stair training;Functional mobility training;Therapeutic activities;Therapeutic exercise;Balance training;Neuromuscular re-education;Patient/family education;Wheelchair mobility training;Manual techniques;Dry needling;Energy conservation    PT Next Visit Plan test 6MWT and 10MWT, also establish HEP for 2 weeks pt will be at [place] Provided visit 2    Consulted and Agree with Plan of Care Patient;Family member/caregiver                Particia Lather PT, DPT  07/17/22 11:06 AM   07/17/22, 11:06 AM

## 2022-07-17 NOTE — Progress Notes (Signed)
Patient presented for influenza vaccine  Vaccine administered by CMA  I was not present for this encounter   Eulis Foster, MD  [facility]  [phone]

## 2022-07-18 ENCOUNTER — Ambulatory Visit: Payer: PPO | Attending: Cardiovascular Disease

## 2022-07-18 DIAGNOSIS — Z5181 Encounter for therapeutic drug level monitoring: Secondary | ICD-10-CM

## 2022-07-18 DIAGNOSIS — I4892 Unspecified atrial flutter: Secondary | ICD-10-CM | POA: Diagnosis not present

## 2022-07-18 DIAGNOSIS — I4891 Unspecified atrial fibrillation: Secondary | ICD-10-CM | POA: Diagnosis not present

## 2022-07-18 DIAGNOSIS — I482 Chronic atrial fibrillation, unspecified: Secondary | ICD-10-CM

## 2022-07-18 LAB — POCT INR: INR: 3 (ref 2.0–3.0)

## 2022-07-18 NOTE — Patient Instructions (Signed)
DECREASE TO 1.5 tablets every day EXCEPT 1 tablet on MONDAY, WEDNESDAY and FRIDAY.  Recheck INR in 4 weeks.

## 2022-07-19 ENCOUNTER — Other Ambulatory Visit: Payer: Self-pay | Admitting: Family Medicine

## 2022-07-19 ENCOUNTER — Ambulatory Visit: Payer: PPO | Admitting: Physical Therapy

## 2022-07-19 ENCOUNTER — Encounter: Payer: Self-pay | Admitting: Physical Therapy

## 2022-07-19 DIAGNOSIS — R262 Difficulty in walking, not elsewhere classified: Secondary | ICD-10-CM | POA: Diagnosis not present

## 2022-07-19 DIAGNOSIS — R2689 Other abnormalities of gait and mobility: Secondary | ICD-10-CM

## 2022-07-19 DIAGNOSIS — R2681 Unsteadiness on feet: Secondary | ICD-10-CM

## 2022-07-19 DIAGNOSIS — E039 Hypothyroidism, unspecified: Secondary | ICD-10-CM

## 2022-07-19 NOTE — Therapy (Signed)
OUTPATIENT PHYSICAL THERAPY treatment note     Patient Name: IZZAK [name] MRN: [id number] DOB:[birth year], 88 y.o., male Today's Date: 07/19/2022  PCP: [name] Durie, MD REFERRING PROVIDER: [name], MD   PT End of Session - 07/19/22 1104     Visit Number 24    Number of Visits 80    Date for PT Re-Evaluation 09/04/22    Authorization Type [employer]    Authorization Time Period -09/04/22    Progress Note Due on Visit 30    PT Start Time 1102    PT Stop Time 1143    PT Time Calculation (min) 41 min    Equipment Utilized During Treatment Gait belt    Activity Tolerance Patient tolerated treatment well    Behavior During Therapy [facility] for tasks assessed/performed                         Past Medical History:  Diagnosis Date   Aortic stenosis    a. 01/2016 echo: mod AS; b. 04/2018 Cardiac MRI: sev AS; c. 05/2018 s/p mech AVR @ time of myomectomy; d. 07/2018 Echo: Mild AS/AI.   Atherosclerosis of aorta ([place])    by CT scan in past   Atrial fibrillation (HCC)    and aflutter. pt has a left atrial circuit that is not ablated. was on amiodarone-stopped, now use rate control.    Bladder cancer ([place])    hx; treated with BCG in past    Carotid artery disease ([place])    There was calcified plaque but no stenosis by carotid artery screening done at [facility] October, 2013   CHB (complete heart block) [facility])    a. 05/2018 s/p BSX VVIR PPM ([facility]) in setting of bradycardia following myomectomy and AVR/MVR.   Diabetes mellitus    type not specified   Diabetic neuropathy ([place])    feet   Elevated liver enzymes    over time; hx   Excessive sweating    Glaucoma    Gout    Head injury    when slipped on ice 2004-2005. stabilized and back on Coumadin    Headache    migraines - distant past   History of cardiac cath    a. 05/2018 Cath ([facility]): Non-obstructive CAD. Mildly elevated filling pressures w/ nl CI.   HOCM (hypertrophic  obstructive cardiomyopathy) ([place])    a. 01/2016 Echo: EF 65-70%, no rwma, LVOT gradient of 80-356mHg, mod AS, SAM; b. 11/2017 Echo: EF 55-60%, near cavity obliteration in systole, mod AS, mild MS; c. 04/2018 Card MRI ([facility]): Sev LVH, EF >70%, Sev AS, LVOT obs w/ Sev MR, mild to mod TR/PR, mid-myocardial HE in basal-mid anteroseptum and inferoseptum; d. 05/2018 s/p Septal myomectomy; e. 07/2018 Echo: EF 60-65%. PASP 334mg.   Homocystinemia    elevated, mild    Hypercholesterolemia    treated.    Hypertension    Infection of right inner ear    Kidney mass    a. s/p laproscopic surgery woth cryoablation of a [facility]: 2cm R kidney mass.   Mediastinal adenopathy    a. 12/208 CT Chest: up to 56m75mLL lung nodule. 1.5-1.7cm subcarinal/mediastinal adenopahty/right paratracheal lymph node; b. 11/2018 CT Chest: 2.5-3cm mediastinal adenopathy.   Mitral regurgitation    a. 04/2018 Cardiac MRI: sev MR; b. 05/2018 s/p mech MVR @ time of myomectomy; c. 07/2018 Echo: Mild MS.  Motion sickness    ocean boats   Nausea    Nonobstructive coronary artery disease    a. 05/2018 Cath ([facility]): nonobs CAD.   Obstructive sleep apnea    CPAP started successfully 2014   Orthostatic hypotension    a. orthostatic. dehydration. hospitalized 11/11   Sleep apnea    Significant obstructive sleep apnea diagnosed in August, 2012, the patient is to receive CPAP    Stroke [facility])    "2 old strokes" CT and MRI. [facility] 11/11. diagnosis was dehydration, no acutal reports.    TSH elevation    on synthroid historically    Unsteady gait    August, 2012   Past Surgical History:  Procedure Laterality Date   BLADDER SURGERY     CATARACT EXTRACTION W/PHACO Left 05/11/2015   Procedure: CATARACT EXTRACTION PHACO AND INTRAOCULAR LENS PLACEMENT ([place]);  Surgeon: Leandrew Koyanagi, MD;  Location: [place];  Service: Ophthalmology;  Laterality: Left;  DIABETIC - oral meds,  CPAP   COLONOSCOPY WITH PROPOFOL N/A 04/14/2019   Procedure: COLONOSCOPY WITH PROPOFOL;  Surgeon: [name], MD;  Location: [facility] ENDOSCOPY;  Service: Endoscopy;  Laterality: N/A;   ESOPHAGOGASTRODUODENOSCOPY (EGD) WITH PROPOFOL N/A 07/01/2018   Procedure: ESOPHAGOGASTRODUODENOSCOPY (EGD) WITH PROPOFOL;  Surgeon: [name], MD;  Location: [facility] ENDOSCOPY;  Service: Endoscopy;  Laterality: N/A;   INSERT / REPLACE / [place]     "froze mass"   MECHANICAL AORTIC AND MITRAL VALVE REPLACEMENT  05/2018   [facility]    TONSILLECTOMY     VALVE REPLACEMENT     Patient Active Problem List   Diagnosis Date Noted   Other abnormalities of gait and mobility 11/15/2021   Depression 11/15/2021   Encounter for fitting and adjustment of hearing aid 11/15/2021   Abnormal CT scan    Abnormal findings on radiological examination of gastrointestinal tract    Polyp of colon    Cancer of right kidney ([place]) 12/10/2018   Pulmonary nodules 12/01/2018   Mediastinal lymphadenopathy 12/01/2018   Heart block AV third degree ([place]) 10/07/2018   Pacemaker 10/07/2018   H/O aortic valve replacement 10/07/2018   Blood in stool    Bleeding duodenal ulcer    GIB (gastrointestinal bleeding) 06/30/2018   Acute upper GI bleed    Decreased activities of daily living (ADL) 06/09/2018   Decreased strength, endurance, and mobility 06/09/2018   Pleural effusion, right 06/09/2018   S/P AVR (aortic valve replacement) 06/08/2018   H/O ventricular septal myectomy 06/08/2018   Chronic anticoagulation 06/02/2018   Heart murmur, systolic 57/32/2025   H/O: rheumatic fever 03/21/2017   Tinnitus 10/30/2016   On warfarin therapy 07/06/2015   Arteriosclerosis of coronary artery 06/13/2015   Carcinoma in situ of bladder 06/13/2015   Diabetes mellitus, type 2 ([place]) 06/13/2015   Diabetic neuropathy ([place]) 06/13/2015   Dizziness 06/13/2015   Essential (primary) hypertension 06/13/2015    Fatigue 06/13/2015   Glaucoma 06/13/2015   Hypercholesteremia 06/13/2015   Male hypogonadism 06/13/2015   Adult hypothyroidism 06/13/2015   Arthritis, degenerative 06/13/2015   CAP (community acquired pneumonia) 06/13/2015   Adenocarcinoma, renal cell ([place]) 06/13/2015   Benign head tremor 12/13/2014   Carotid arterial disease ([place]) 07/12/2014   Decreased cardiac output 07/12/2014   Cardiomyopathy, hypertrophic obstructive ([place]) 07/12/2014   Head injuries 07/12/2014   HOCM (hypertrophic obstructive cardiomyopathy) ([place])    Encounter for therapeutic drug monitoring 11/16/2013   Obstructive sleep apnea  Benign prostatic hypertrophy without urinary obstruction 06/09/2012   History of neoplasm of bladder 06/09/2012   Head injury    Atherosclerosis of aorta (HCC)    Hypotension    Stroke (HCC)    Unsteady gait    Excessive sweating    Nausea    ORTHOSTATIC HYPOTENSION, HX OF 09/01/2010   Overweight 03/07/2009   MITRAL REGURGITATION 03/05/2009   Chronic atrial fibrillation ([place]) 03/05/2009   Atrial fibrillation and flutter ([place]) 03/05/2009   Mitral and aortic incompetence 03/05/2009      REFERRING DIAG: Imbalance  THERAPY DIAG:  Difficulty in walking, not elsewhere classified  Other abnormalities of gait and mobility  Unsteadiness on feet  Rationale for Evaluation and Treatment Rehabilitation  PERTINENT HISTORY:  Pt report she has been having balance issues secondary to his ears. Pt was in the artilerary. Pt sold a home in april and has fallen 5 times since then. Pt tripped over a piece of wood in back yard and fell on one occassion. Pt has history of cardiac rehab here at [facility]. Pt has a very difficult time with balance without his cane. He has difficulty with balance when he turns his head.   PRECAUTIONS: Fall  SUBJECTIVE: Pt reports no significant changes since last session and overall has been doing well.  Presents to therapy with his wife; Tiffany Kocher  PAIN:  Are  you having pain? No     TODAY'S TREATMENT: 07/19/22  Exercise/Activity Sets/Reps/Time/ Resistance Assistance Charge type Comments  STS with airex pad and no UE support  2*5 reps (sets between walking intervals)  CGA, TA Improved form with no need for UE assist, cues for full upright stance and anterior weight shift.   Standing on airex reaching and picking up cones (Functional squat) 3*9 cones on various height steps anterior  TA   Ambulaiton with 1.5# AW  3 x 300 feet with 2 min rest and STS in between  [place] with retro ambulation and with transition due to NBOS, cues did not ipmrove NBOS balance reaction strategy         Treatment Provided this session   Pt educated throughout session about proper posture and technique with exercises. Improved exercise technique, movement at target joints, use of target muscles after min to mod verbal, visual, tactile cues. Note: Portions of this document were prepared using Dragon voice recognition software and although reviewed may contain unintentional dictation errors in syntax, grammar, or spelling.             PATIENT EDUCATION: Education details: Education regarding when to use of EMS services and education regarding what the services entail the patient is to have a fall. Person educated: Patient Education method: Explanation, Demonstration, Tactile cues, and Verbal cues Education comprehension: verbalized understanding, returned demonstration, and verbal cues required   HOME EXERCISE PROGRAM:  Access Code: [place] URL: [email or web address] Date: 04/26/2022 Prepared by: [place]  Exercises - Standing March with Counter Support  - 1 x daily - 7 x weekly - 3 sets - 10 reps - Standing Knee Flexion with Unilateral Counter Support  - 1 x daily - 7 x weekly - 3 sets - 10 reps - Heel Toe Raises with Unilateral Counter Support  - 1 x daily - 7 x weekly - 3 sets - 10 reps - Standing Hip Abduction with Unilateral  Counter Support  - 1 x daily - 7 x weekly - 3 sets - 10 reps - Standing Hip Extension with Unilateral Counter Support  -  1 x daily - 7 x weekly - 3 sets - 10 reps   PT Short Term Goals        PT SHORT TERM GOAL #1   Title Patient will be independent in home exercise program to improve strength/mobility for better functional independence with ADLs.    Baseline no HEP 8/15: Not completing regularly, lots of discussion about importance of comliance    Time 4    Period Weeks    Status Progressing    Target Date 04/16/22              PT Long Term Goals       PT LONG TERM GOAL #1   Title Patient will increase FOTO score to equal to or greater than   48  to demonstrate statistically significant improvement in mobility and quality of life.    Baseline 42 9/19:54   Time 8    Period Weeks    Status Met    Target Date 06/11/22      PT LONG TERM GOAL #2   Title Patient  will complete five times sit to stand test in < 17 seconds indicating an increased LE strength and improved balance.    Baseline 23.53 sec 8/15: 17 sec heavy UE use  9/19: 15.8 sec   Time 12    Period Weeks    Status Met          PT LONG TERM GOAL #3   Title Patient will increase Berg Balance score by > 6 points to demonstrate decreased fall risk during functional activities.    Baseline 34,  8/15:38 9/19: test next session 9/21:43   Time 12    Period Weeks    Status Partially met       PT LONG TERM GOAL #4   Title Patient will increase six minute walk test distance to >1000 for progression to community ambulator and improve gait ability    Baseline 725 feet 9/19: 745 feet without AD    Time 12    Period Weeks    Status Partially met     Target Date 09/04/22      PT LONG TERM GOAL #5   Title Patient will increase 10 meter walk test to >1.67ms as to improve gait speed for better community ambulation and to reduce fall risk.    Baseline .58 m/s 9/19: .65 m/s   Time 12    Period Weeks    Status Ongoing      Target Date 09/04/22                    Plan     Clinical Impression Statement  Continued with current plan of care as laid out in evaluation and recent prior sessions. Pt remains motivated to advance progress toward goals in order to maximize independence and safety at home. Pt requires high level assistance and cuing for completion of exercises in order to provide adequate level of stimulation and perturbation. Author allows pt as much opportunity as possible to perform independent righting strategies, only stepping in when pt is unable to prevent falling to floor.  P continue with lower extremity strengthening as well as functional balance activities.  Continued functional squatting on unstable surface to work on patient's ability to pick items up from the services without loss of balance. Also reintegrated interval walking with SPC to improve endurance and LE strength.  Patient does continue to show significant lower extremity weakness as e sit  to stand without upper extremity support.  Pt continues to demonstrate progress toward goals AEB progression of some interventions this date either in volume or intensity.         Personal Factors and Comorbidities Age;Comorbidity 1;Comorbidity 2;Comorbidity 3+    Comorbidities CAD, Peripheral neuropathy, T2DM, HTN,    Examination-Activity Limitations Caring for Others;Carry;Locomotion Level;Stairs;Stand    Examination-Participation Restrictions Yard Work;Shop;Community Activity    Stability/Clinical Decision Making Evolving/Moderate complexity    Rehab Potential Fair    PT Frequency 2x / week    PT Duration 12 weeks    PT Treatment/Interventions ADLs/Self Care Home Management;Moist Heat;DME Instruction;Stair training;Functional mobility training;Therapeutic activities;Therapeutic exercise;Balance training;Neuromuscular re-education;Patient/family education;Wheelchair mobility training;Manual techniques;Dry needling;Energy conservation    PT  Next Visit Plan test 6MWT and 10MWT, also establish HEP for 2 weeks pt will be at [facility] Provided visit 2    Consulted and Agree with Plan of Care Patient;Family member/caregiver                Particia Lather PT, DPT  07/19/22 11:05 AM   07/19/22, 11:05 AM

## 2022-07-24 ENCOUNTER — Ambulatory Visit: Payer: PPO | Admitting: Physical Therapy

## 2022-07-26 ENCOUNTER — Ambulatory Visit: Payer: PPO | Admitting: Physical Therapy

## 2022-07-26 ENCOUNTER — Encounter: Payer: Self-pay | Admitting: Physical Therapy

## 2022-07-26 DIAGNOSIS — R262 Difficulty in walking, not elsewhere classified: Secondary | ICD-10-CM | POA: Diagnosis not present

## 2022-07-26 DIAGNOSIS — R2681 Unsteadiness on feet: Secondary | ICD-10-CM

## 2022-07-26 DIAGNOSIS — R2689 Other abnormalities of gait and mobility: Secondary | ICD-10-CM

## 2022-07-26 NOTE — Therapy (Signed)
OUTPATIENT PHYSICAL THERAPY treatment note     Patient Name: [name] [name] MRN: [id number] DOB:[name] [name] 28, 1935, 88 y.o., male Today's Date: 07/26/2022  PCP: [name] Durie, MD REFERRING PROVIDER: [name], MD   PT End of Session - 07/26/22 1104     Visit Number 25    Number of Visits 64    Date for PT Re-Evaluation 09/04/22    Authorization Type [employer]    Authorization Time Period -09/04/22    Progress Note Due on Visit 41    PT Start Time 1102    PT Stop Time 1145    PT Time Calculation (min) 43 min    Equipment Utilized During Treatment Gait belt    Activity Tolerance Patient tolerated treatment well    Behavior During Therapy [facility] for tasks assessed/performed                          Past Medical History:  Diagnosis Date   Aortic stenosis    a. 01/2016 echo: mod AS; b. 04/2018 Cardiac MRI: sev AS; c. 05/2018 s/p mech AVR @ time of myomectomy; d. 07/2018 Echo: Mild AS/AI.   Atherosclerosis of aorta ([place])    by CT scan in past   Atrial fibrillation (HCC)    and aflutter. pt has a left atrial circuit that is not ablated. was on amiodarone-stopped, now use rate control.    Bladder cancer ([place])    hx; treated with BCG in past    Carotid artery disease ([place])    There was calcified plaque but no stenosis by carotid artery screening done at [facility] October, 2013   CHB (complete heart block) [facility])    a. 05/2018 s/p BSX VVIR PPM ([facility]) in setting of bradycardia following myomectomy and AVR/MVR.   Diabetes mellitus    type not specified   Diabetic neuropathy ([place])    feet   Elevated liver enzymes    over time; hx   Excessive sweating    Glaucoma    Gout    Head injury    when slipped on ice 2004-2005. stabilized and back on Coumadin    Headache    migraines - distant past   History of cardiac cath    a. 05/2018 Cath ([facility]): Non-obstructive CAD. Mildly elevated filling pressures w/ nl CI.   HOCM  (hypertrophic obstructive cardiomyopathy) ([place])    a. 01/2016 Echo: EF 65-70%, no rwma, LVOT gradient of 80-55mmHg, mod AS, SAM; b. 11/2017 Echo: EF 55-60%, near cavity obliteration in systole, mod AS, mild MS; c. 04/2018 Card MRI ([facility]): Sev LVH, EF >70%, Sev AS, LVOT obs w/ Sev MR, mild to mod TR/PR, mid-myocardial HE in basal-mid anteroseptum and inferoseptum; d. 05/2018 s/p Septal myomectomy; e. 07/2018 Echo: EF 60-65%. PASP 66mmHg.   Homocystinemia    elevated, mild    Hypercholesterolemia    treated.    Hypertension    Infection of right inner ear    Kidney mass    a. s/p laproscopic surgery woth cryoablation of a mass outside kidney followed at [facility]; b. 11/2018 CT Chest: 2cm R kidney mass.   Mediastinal adenopathy    a. 12/208 CT Chest: up to 41mm LLL lung nodule. 1.5-1.7cm subcarinal/mediastinal adenopahty/right paratracheal lymph node; b. 11/2018 CT Chest: 2.5-3cm mediastinal adenopathy.   Mitral regurgitation    a. 04/2018 Cardiac MRI: sev MR; b. 05/2018 s/p mech MVR @ time of myomectomy; c. 07/2018 Echo: Mild MS.  Motion sickness    ocean boats   Nausea    Nonobstructive coronary artery disease    a. 05/2018 Cath ([facility]): nonobs CAD.   Obstructive sleep apnea    CPAP started successfully 2014   Orthostatic hypotension    a. orthostatic. dehydration. hospitalized 11/11   Sleep apnea    Significant obstructive sleep apnea diagnosed in August, 2012, the patient is to receive CPAP    Stroke [facility])    "2 old strokes" CT and MRI. [place] [facility] 11/11. diagnosis was dehydration, no acutal reports.    TSH elevation    on synthroid historically    Unsteady gait    August, 2012   Past Surgical History:  Procedure Laterality Date   BLADDER SURGERY     CATARACT EXTRACTION W/PHACO Left 05/11/2015   Procedure: CATARACT EXTRACTION PHACO AND INTRAOCULAR LENS PLACEMENT ([place]);  Surgeon: Leandrew Koyanagi, MD;  Location: [facility];  Service: Ophthalmology;  Laterality: Left;  DIABETIC  - oral meds, CPAP   COLONOSCOPY WITH PROPOFOL N/A 04/14/2019   Procedure: COLONOSCOPY WITH PROPOFOL;  Surgeon: [name], MD;  Location: [facility] ENDOSCOPY;  Service: Endoscopy;  Laterality: N/A;   ESOPHAGOGASTRODUODENOSCOPY (EGD) WITH PROPOFOL N/A 07/01/2018   Procedure: ESOPHAGOGASTRODUODENOSCOPY (EGD) WITH PROPOFOL;  Surgeon: [name], MD;  Location: [facility] ENDOSCOPY;  Service: Endoscopy;  Laterality: N/A;   INSERT / REPLACE / [place]     "froze mass"   MECHANICAL AORTIC AND MITRAL VALVE REPLACEMENT  05/2018   [facility]    TONSILLECTOMY     VALVE REPLACEMENT     Patient Active Problem List   Diagnosis Date Noted   Other abnormalities of gait and mobility 11/15/2021   Depression 11/15/2021   Encounter for fitting and adjustment of hearing aid 11/15/2021   Abnormal CT scan    Abnormal findings on radiological examination of gastrointestinal tract    Polyp of colon    Cancer of right kidney ([place]) 12/10/2018   Pulmonary nodules 12/01/2018   Mediastinal lymphadenopathy 12/01/2018   Heart block AV third degree ([place]) 10/07/2018   Pacemaker 10/07/2018   H/O aortic valve replacement 10/07/2018   Blood in stool    Bleeding duodenal ulcer    GIB (gastrointestinal bleeding) 06/30/2018   Acute upper GI bleed    Decreased activities of daily living (ADL) 06/09/2018   Decreased strength, endurance, and mobility 06/09/2018   Pleural effusion, right 06/09/2018   S/P AVR (aortic valve replacement) 06/08/2018   H/O ventricular septal myectomy 06/08/2018   Chronic anticoagulation 06/02/2018   Heart murmur, systolic 88/91/6945   H/O: rheumatic fever 03/21/2017   Tinnitus 10/30/2016   On warfarin therapy 07/06/2015   Arteriosclerosis of coronary artery 06/13/2015   Carcinoma in situ of bladder 06/13/2015   Diabetes mellitus, type 2 ([place]) 06/13/2015   Diabetic neuropathy ([place]) 06/13/2015   Dizziness 06/13/2015   Essential (primary) hypertension  06/13/2015   Fatigue 06/13/2015   Glaucoma 06/13/2015   Hypercholesteremia 06/13/2015   Male hypogonadism 06/13/2015   Adult hypothyroidism 06/13/2015   Arthritis, degenerative 06/13/2015   CAP (community acquired pneumonia) 06/13/2015   Adenocarcinoma, renal cell ([place]) 06/13/2015   Benign head tremor 12/13/2014   Carotid arterial disease ([place]) 07/12/2014   Decreased cardiac output 07/12/2014   Cardiomyopathy, hypertrophic obstructive ([place]) 07/12/2014   Head injuries 07/12/2014   HOCM (hypertrophic obstructive cardiomyopathy) ([place])    Encounter for therapeutic drug monitoring 11/16/2013   Obstructive sleep apnea  Benign prostatic hypertrophy without urinary obstruction 06/09/2012   History of neoplasm of bladder 06/09/2012   Head injury    Atherosclerosis of aorta (HCC)    Hypotension    Stroke (HCC)    Unsteady gait    Excessive sweating    Nausea    ORTHOSTATIC HYPOTENSION, HX OF 09/01/2010   Overweight 03/07/2009   MITRAL REGURGITATION 03/05/2009   Chronic atrial fibrillation ([place]) 03/05/2009   Atrial fibrillation and flutter ([place]) 03/05/2009   Mitral and aortic incompetence 03/05/2009      REFERRING DIAG: Imbalance  THERAPY DIAG:  Difficulty in walking, not elsewhere classified  Other abnormalities of gait and mobility  Unsteadiness on feet  Rationale for Evaluation and Treatment Rehabilitation  PERTINENT HISTORY:  Pt report she has been having balance issues secondary to his ears. Pt was in the artilerary. Pt sold a home in april and has fallen 5 times since then. Pt tripped over a piece of wood in back yard and fell on one occassion. Pt has history of cardiac rehab here at [facility]. Pt has a very difficult time with balance without his cane. He has difficulty with balance when he turns his head.   PRECAUTIONS: Fall  SUBJECTIVE: Patient caregiver reports recent diagnosis of dementia which is troubling her.  Patient caregiver also wants to consider discharge date  for about 3 to 4 weeks secondary to busyness of schedule.  Physical therapist is comfortable with this plan and will work toward progressing patient to discharge over the next upcoming physical therapy sessions in addition to continuing to challenge his balance and mobility.  Presents to therapy with his wife; [name] [name]  PAIN:  Are you having pain? No     TODAY'S TREATMENT: 07/26/22  Exercise/Activity Sets/Reps/Time/ Resistance Assistance Charge type Comments  STS with airex pad and no UE support  2*8 reps (sets between walking intervals)  CGA, TA Improved form with no need for UE assist, cues for full upright stance and anterior weight shift.   Standing on airex reaching and picking up cones (Functional squat) 2*5 cones on various height steps anterior 1x5 cones without Airex pad with cones on lowest step which was about 6 inches  TA   Ambulaiton with 1.5# AW  2 x 300 feet with 2 min rest and STS in between  [place] with retro ambulation and with transition due to NBOS, cues did not ipmrove NBOS balance reaction strategy   Resisted walking posterior only  7.5# x 3 laps   TA Cues for foot width and for step size   Treatment Provided this session   Pt educated throughout session about proper posture and technique with exercises. Improved exercise technique, movement at target joints, use of target muscles after min to mod verbal, visual, tactile cues. Note: Portions of this document were prepared using Dragon voice recognition software and although reviewed may contain unintentional dictation errors in syntax, grammar, or spelling.             PATIENT EDUCATION: Education details: Education regarding when to use of EMS services and education regarding what the services entail the patient is to have a fall. Person educated: Patient Education method: Explanation, Demonstration, Tactile cues, and Verbal cues Education comprehension: verbalized understanding, returned  demonstration, and verbal cues required   HOME EXERCISE PROGRAM:  Access Code: [place] URL: [email or web address] Date: 04/26/2022 Prepared by: Brayton Nation  Exercises - Standing March with Counter Support  - 1 x daily - 7 x weekly - 3 sets -  10 reps - Standing Knee Flexion with Unilateral Counter Support  - 1 x daily - 7 x weekly - 3 sets - 10 reps - Heel Toe Raises with Unilateral Counter Support  - 1 x daily - 7 x weekly - 3 sets - 10 reps - Standing Hip Abduction with Unilateral Counter Support  - 1 x daily - 7 x weekly - 3 sets - 10 reps - Standing Hip Extension with Unilateral Counter Support  - 1 x daily - 7 x weekly - 3 sets - 10 reps   PT Short Term Goals        PT SHORT TERM GOAL #1   Title Patient will be independent in home exercise program to improve strength/mobility for better functional independence with ADLs.    Baseline no HEP 8/15: Not completing regularly, lots of discussion about importance of comliance    Time 4    Period Weeks    Status Progressing    Target Date 04/16/22              PT Long Term Goals       PT LONG TERM GOAL #1   Title Patient will increase FOTO score to equal to or greater than   48  to demonstrate statistically significant improvement in mobility and quality of life.    Baseline 42 9/19:54   Time 8    Period Weeks    Status Met    Target Date 06/11/22      PT LONG TERM GOAL #2   Title Patient  will complete five times sit to stand test in < 17 seconds indicating an increased LE strength and improved balance.    Baseline 23.53 sec 8/15: 17 sec heavy UE use  9/19: 15.8 sec   Time 12    Period Weeks    Status Met          PT LONG TERM GOAL #3   Title Patient will increase Berg Balance score by > 6 points to demonstrate decreased fall risk during functional activities.    Baseline 34,  8/15:38 9/19: test next session 9/21:43   Time 12    Period Weeks    Status Partially met       PT LONG TERM GOAL #4    Title Patient will increase six minute walk test distance to >1000 for progression to community ambulator and improve gait ability    Baseline 725 feet 9/19: 745 feet without AD    Time 12    Period Weeks    Status Partially met     Target Date 09/04/22      PT LONG TERM GOAL #5   Title Patient will increase 10 meter walk test to >1.50m/s as to improve gait speed for better community ambulation and to reduce fall risk.    Baseline .58 m/s 9/19: .65 m/s   Time 12    Period Weeks    Status Ongoing     Target Date 09/04/22                    Plan     Clinical Impression Statement  Patient continues to demonstrate great motivation for physical therapy activities.  Patient caregiver thoroughly educated regarding benefits of exercise for brain health.  Patient and caregiver also educated regarding future discharge date for about 3 to 4 weeks from today's session depending on their schedule and ability to complete all remaining visits.  Patient caregiver comfortable with this  plan.  Patient will continue to benefit with physical therapy to improve his mobility, balance, prevent falls, and improve safety at home in the community.       Personal Factors and Comorbidities Age;Comorbidity 1;Comorbidity 2;Comorbidity 3+    Comorbidities CAD, Peripheral neuropathy, T2DM, HTN,    Examination-Activity Limitations Caring for Others;Carry;Locomotion Level;Stairs;Stand    Examination-Participation Restrictions Yard Work;Shop;Community Activity    Stability/Clinical Decision Making Evolving/Moderate complexity    Rehab Potential Fair    PT Frequency 2x / week    PT Duration 12 weeks    PT Treatment/Interventions ADLs/Self Care Home Management;Moist Heat;DME Instruction;Stair training;Functional mobility training;Therapeutic activities;Therapeutic exercise;Balance training;Neuromuscular re-education;Patient/family education;Wheelchair mobility training;Manual techniques;Dry needling;Energy  conservation    PT Next Visit Plan test 6MWT and 10MWT, also establish HEP for 2 weeks pt will be at [place] Provided visit 2    Consulted and Agree with Plan of Care Patient;Family member/caregiver                Particia Lather PT, DPT  07/26/22 12:10 PM   07/26/22, 12:10 PM

## 2022-07-31 ENCOUNTER — Ambulatory Visit: Payer: PPO | Admitting: Physical Therapy

## 2022-07-31 ENCOUNTER — Ambulatory Visit: Payer: PPO | Admitting: Family Medicine

## 2022-07-31 DIAGNOSIS — R2689 Other abnormalities of gait and mobility: Secondary | ICD-10-CM

## 2022-07-31 DIAGNOSIS — R262 Difficulty in walking, not elsewhere classified: Secondary | ICD-10-CM

## 2022-07-31 DIAGNOSIS — R2681 Unsteadiness on feet: Secondary | ICD-10-CM

## 2022-07-31 DIAGNOSIS — M6281 Muscle weakness (generalized): Secondary | ICD-10-CM

## 2022-07-31 NOTE — Therapy (Signed)
OUTPATIENT PHYSICAL THERAPY treatment note     Patient Name: [name] [name] MRN: [id number] DOB:[birth year], 88 y.o., male Today's Date: 07/31/2022  PCP: [name], MD REFERRING PROVIDER: [name], MD   PT End of Session - 07/31/22 1116     Visit Number 26    Number of Visits 35    Date for PT Re-Evaluation 09/04/22    Authorization Type [employer]    Authorization Time Period -09/04/22    Progress Note Due on Visit 30    PT Start Time 1106    PT Stop Time 1144    PT Time Calculation (min) 38 min    Equipment Utilized During Treatment Gait belt    Activity Tolerance Patient tolerated treatment well    Behavior During Therapy [facility] for tasks assessed/performed                          Past Medical History:  Diagnosis Date   Aortic stenosis    a. 01/2016 echo: mod AS; b. 04/2018 Cardiac MRI: sev AS; c. 05/2018 s/p mech AVR @ time of myomectomy; d. 07/2018 Echo: Mild AS/AI.   Atherosclerosis of aorta ([place])    by CT scan in past   Atrial fibrillation (HCC)    and aflutter. pt has a left atrial circuit that is not ablated. was on amiodarone-stopped, now use rate control.    Bladder cancer ([place])    hx; treated with BCG in past    Carotid artery disease ([place])    There was calcified plaque but no stenosis by carotid artery screening done at [facility] October, 2013   CHB (complete heart block) [facility])    a. 05/2018 s/p BSX VVIR PPM ([facility]) in setting of bradycardia following myomectomy and AVR/MVR.   Diabetes mellitus    type not specified   Diabetic neuropathy ([place])    feet   Elevated liver enzymes    over time; hx   Excessive sweating    Glaucoma    Gout    Head injury    when slipped on ice 2004-2005. stabilized and back on Coumadin    Headache    migraines - distant past   History of cardiac cath    a. 05/2018 Cath ([facility]): Non-obstructive CAD. Mildly elevated filling pressures w/ nl CI.   HOCM  (hypertrophic obstructive cardiomyopathy) ([place])    a. 01/2016 Echo: EF 65-70%, no rwma, LVOT gradient of 80-436mHg, mod AS, SAM; b. 11/2017 Echo: EF 55-60%, near cavity obliteration in systole, mod AS, mild MS; c. 04/2018 Card MRI ([facility]): Sev LVH, EF >70%, Sev AS, LVOT obs w/ Sev MR, mild to mod TR/PR, mid-myocardial HE in basal-mid anteroseptum and inferoseptum; d. 05/2018 s/p Septal myomectomy; e. 07/2018 Echo: EF 60-65%. PASP 338mg.   Homocystinemia    elevated, mild    Hypercholesterolemia    treated.    Hypertension    Infection of right inner ear    Kidney mass    a. s/p laproscopic surgery woth cryoablation of a mass outside kidney [facility]/2018 CT Chest: 2cm R kidney mass.   Mediastinal adenopathy    a. 12/208 CT Chest: up to 36m25mLL lung nodule. 1.5-1.7cm subcarinal/mediastinal adenopahty/right paratracheal lymph node; b. 11/2018 CT Chest: 2.5-3cm mediastinal adenopathy.   Mitral regurgitation    a. 04/2018 Cardiac MRI: sev MR; b. 05/2018 s/p mech MVR @ time of myomectomy; c. 07/2018 Echo: Mild MS.  Motion sickness    ocean boats   Nausea    Nonobstructive coronary artery disease    a. 05/2018 Cath ([facility]): nonobs CAD.   Obstructive sleep apnea    CPAP started successfully 2014   Orthostatic hypotension    a. orthostatic. dehydration. hospitalized 11/11   Sleep apnea    Significant obstructive sleep apnea diagnosed in August, 2012, the patient is to receive CPAP    Stroke [facility])    "2 old strokes" CT and MRI. [place] [facility] 11/11. diagnosis was dehydration, no acutal reports.    TSH elevation    on synthroid historically    Unsteady gait    August, 2012   Past Surgical History:  Procedure Laterality Date   BLADDER SURGERY     CATARACT EXTRACTION W/PHACO Left 05/11/2015   Procedure: CATARACT EXTRACTION PHACO AND INTRAOCULAR LENS PLACEMENT ([place]);  Surgeon: Leandrew Koyanagi, MD;  Location: [place];  Service: Ophthalmology;  Laterality: Left;  DIABETIC  - oral meds, CPAP   COLONOSCOPY WITH PROPOFOL N/A 04/14/2019   Procedure: COLONOSCOPY WITH PROPOFOL;  Surgeon: [name], MD;  Location: [facility] ENDOSCOPY;  Service: Endoscopy;  Laterality: N/A;   ESOPHAGOGASTRODUODENOSCOPY (EGD) WITH PROPOFOL N/A 07/01/2018   Procedure: ESOPHAGOGASTRODUODENOSCOPY (EGD) WITH PROPOFOL;  Surgeon: [name], MD;  Location: [facility] ENDOSCOPY;  Service: Endoscopy;  Laterality: N/A;   INSERT / REPLACE / [place]     "froze mass"   MECHANICAL AORTIC AND MITRAL VALVE REPLACEMENT  05/2018   [facility]    TONSILLECTOMY     VALVE REPLACEMENT     Patient Active Problem List   Diagnosis Date Noted   Other abnormalities of gait and mobility 11/15/2021   Depression 11/15/2021   Encounter for fitting and adjustment of hearing aid 11/15/2021   Abnormal CT scan    Abnormal findings on radiological examination of gastrointestinal tract    Polyp of colon    Cancer of right kidney ([place]) 12/10/2018   Pulmonary nodules 12/01/2018   Mediastinal lymphadenopathy 12/01/2018   Heart block AV third degree ([place]) 10/07/2018   Pacemaker 10/07/2018   H/O aortic valve replacement 10/07/2018   Blood in stool    Bleeding duodenal ulcer    GIB (gastrointestinal bleeding) 06/30/2018   Acute upper GI bleed    Decreased activities of daily living (ADL) 06/09/2018   Decreased strength, endurance, and mobility 06/09/2018   Pleural effusion, right 06/09/2018   S/P AVR (aortic valve replacement) 06/08/2018   H/O ventricular septal myectomy 06/08/2018   Chronic anticoagulation 06/02/2018   Heart murmur, systolic 88/91/6945   H/O: rheumatic fever 03/21/2017   Tinnitus 10/30/2016   On warfarin therapy 07/06/2015   Arteriosclerosis of coronary artery 06/13/2015   Carcinoma in situ of bladder 06/13/2015   Diabetes mellitus, type 2 ([place]) 06/13/2015   Diabetic neuropathy ([place]) 06/13/2015   Dizziness 06/13/2015   Essential (primary) hypertension  06/13/2015   Fatigue 06/13/2015   Glaucoma 06/13/2015   Hypercholesteremia 06/13/2015   Male hypogonadism 06/13/2015   Adult hypothyroidism 06/13/2015   Arthritis, degenerative 06/13/2015   CAP (community acquired pneumonia) 06/13/2015   Adenocarcinoma, renal cell ([place]) 06/13/2015   Benign head tremor 12/13/2014   Carotid arterial disease ([place]) 07/12/2014   Decreased cardiac output 07/12/2014   Cardiomyopathy, hypertrophic obstructive ([place]) 07/12/2014   Head injuries 07/12/2014   HOCM (hypertrophic obstructive cardiomyopathy) ([place])    Encounter for therapeutic drug monitoring 11/16/2013   Obstructive sleep apnea  Benign prostatic hypertrophy without urinary obstruction 06/09/2012   History of neoplasm of bladder 06/09/2012   Head injury    Atherosclerosis of aorta (HCC)    Hypotension    Stroke (HCC)    Unsteady gait    Excessive sweating    Nausea    ORTHOSTATIC HYPOTENSION, HX OF 09/01/2010   Overweight 03/07/2009   MITRAL REGURGITATION 03/05/2009   Chronic atrial fibrillation ([place]) 03/05/2009   Atrial fibrillation and flutter ([place]) 03/05/2009   Mitral and aortic incompetence 03/05/2009      REFERRING DIAG: Imbalance  THERAPY DIAG:  Difficulty in walking, not elsewhere classified  Other abnormalities of gait and mobility  Unsteadiness on feet  Muscle weakness (generalized)  Rationale for Evaluation and Treatment Rehabilitation  PERTINENT HISTORY:  Pt report she has been having balance issues secondary to his ears. Pt was in the artilerary. Pt sold a home in april and has fallen 5 times since then. Pt tripped over a piece of wood in back yard and fell on one occassion. Pt has history of cardiac rehab here at [facility]. Pt has a very difficult time with balance without his cane. He has difficulty with balance when he turns his head.   PRECAUTIONS: Fall  SUBJECTIVE: Patient caregiver reports recent diagnosis of dementia which is troubling her.  Patient caregiver also  wants to consider discharge date for about 3 to 4 weeks secondary to busyness of schedule.  Physical therapist is comfortable with this plan and will work toward progressing patient to discharge over the next upcoming physical therapy sessions in addition to continuing to challenge his balance and mobility.  Presents to therapy with his wife; Tiffany Kocher  PAIN:  Are you having pain? No     TODAY'S TREATMENT: 07/31/22  Exercise/Activity Sets/Reps/Time/ Resistance Assistance Charge type Comments  STS with airex pad and no UE support  2*8 reps (sets between walking intervals)  CGA, TA Improved form with no need for UE assist, cues for full upright stance and anterior weight shift.   Standing on airex reaching and picking up cones (Functional squat) 2*5 cones on various height steps anterior 1x5 cones without Airex pad with cones on lowest step which was about 6 inches  TA   Ambulaiton with 1.5# AW  2 x 325 feet with 2 min rest and STS in between and with 2# Aw donned SPC TA LOB with retro ambulation and with transition due to NBOS, cues did not ipmrove NBOS balance reaction strategy   Standing donkey kick into bosu ball leaned against wall ( external cue for correct form and muscle activation)  *12  TA Cues for foot width and for step size   Treatment Provided this session   Pt educated throughout session about proper posture and technique with exercises. Improved exercise technique, movement at target joints, use of target muscles after min to mod verbal, visual, tactile cues. Note: Portions of this document were prepared using Dragon voice recognition software and although reviewed may contain unintentional dictation errors in syntax, grammar, or spelling.             PATIENT EDUCATION: Education details: Education regarding when to use of EMS services and education regarding what the services entail the patient is to have a fall. Person educated: Patient Education method:  Explanation, Demonstration, Tactile cues, and Verbal cues Education comprehension: verbalized understanding, returned demonstration, and verbal cues required   HOME EXERCISE PROGRAM:  Access Code: [place] URL: [email or web address] Date: 04/26/2022 Prepared by: [place]  Exercises -  Standing March with Counter Support  - 1 x daily - 7 x weekly - 3 sets - 10 reps - Standing Knee Flexion with Unilateral Counter Support  - 1 x daily - 7 x weekly - 3 sets - 10 reps - Heel Toe Raises with Unilateral Counter Support  - 1 x daily - 7 x weekly - 3 sets - 10 reps - Standing Hip Abduction with Unilateral Counter Support  - 1 x daily - 7 x weekly - 3 sets - 10 reps - Standing Hip Extension with Unilateral Counter Support  - 1 x daily - 7 x weekly - 3 sets - 10 reps   PT Short Term Goals        PT SHORT TERM GOAL #1   Title Patient will be independent in home exercise program to improve strength/mobility for better functional independence with ADLs.    Baseline no HEP 8/15: Not completing regularly, lots of discussion about importance of comliance    Time 4    Period Weeks    Status Progressing    Target Date 04/16/22              PT Long Term Goals       PT LONG TERM GOAL #1   Title Patient will increase FOTO score to equal to or greater than   48  to demonstrate statistically significant improvement in mobility and quality of life.    Baseline 42 9/19:54   Time 8    Period Weeks    Status Met    Target Date 06/11/22      PT LONG TERM GOAL #2   Title Patient  will complete five times sit to stand test in < 17 seconds indicating an increased LE strength and improved balance.    Baseline 23.53 sec 8/15: 17 sec heavy UE use  9/19: 15.8 sec   Time 12    Period Weeks    Status Met          PT LONG TERM GOAL #3   Title Patient will increase Berg Balance score by > 6 points to demonstrate decreased fall risk during functional activities.    Baseline 34,   8/15:38 9/19: test next session 9/21:43   Time 12    Period Weeks    Status Partially met       PT LONG TERM GOAL #4   Title Patient will increase six minute walk test distance to >1000 for progression to community ambulator and improve gait ability    Baseline 725 feet 9/19: 745 feet without AD    Time 12    Period Weeks    Status Partially met     Target Date 09/04/22      PT LONG TERM GOAL #5   Title Patient will increase 10 meter walk test to >1.39ms as to improve gait speed for better community ambulation and to reduce fall risk.    Baseline .58 m/s 9/19: .65 m/s   Time 12    Period Weeks    Status Ongoing     Target Date 09/04/22                    Plan     Clinical Impression Statement  Patient continues to demonstrate great motivation for physical therapy activities.  Pt educated regarding memory therapy and order being sent to new PCP regarding ne diagnosis of dementia.   Patient caregiver comfortable with this plan.  Patient continues to  progress with lower extremity strength and mobility as well as functional task which is squatting and reaching.  Patient also implored to continue with exercise in addition to his normal physical activity in order to maintain his current level of function.  Patient will continue to benefit with physical therapy to improve his mobility, balance, prevent falls, and improve safety at home in the community.       Personal Factors and Comorbidities Age;Comorbidity 1;Comorbidity 2;Comorbidity 3+    Comorbidities CAD, Peripheral neuropathy, T2DM, HTN,    Examination-Activity Limitations Caring for Others;Carry;Locomotion Level;Stairs;Stand    Examination-Participation Restrictions Yard Work;Shop;Community Activity    Stability/Clinical Decision Making Evolving/Moderate complexity    Rehab Potential Fair    PT Frequency 2x / week    PT Duration 12 weeks    PT Treatment/Interventions ADLs/Self Care Home Management;Moist Heat;DME  Instruction;Stair training;Functional mobility training;Therapeutic activities;Therapeutic exercise;Balance training;Neuromuscular re-education;Patient/family education;Wheelchair mobility training;Manual techniques;Dry needling;Energy conservation    PT Next Visit Plan test 6MWT and 10MWT, also establish HEP for 2 weeks pt will be at [place] Provided visit 2    Consulted and Agree with Plan of Care Patient;Family member/caregiver                Particia Lather PT, DPT  07/31/22 1:39 PM   07/31/22, 1:39 PM

## 2022-08-01 NOTE — Progress Notes (Signed)
I,Sulibeya S Dimas,acting as a scribe for Lavon Paganini, MD.,have documented all relevant documentation on the behalf of Lavon Paganini, MD,as directed by  Lavon Paganini, MD while in the presence of Lavon Paganini, MD.     Established patient visit   Patient: [name] [name]   DOB: [birth year]   88 y.o. Male  MRN: [id number] Visit Date: 08/02/2022  Today's healthcare provider: Lavon Paganini, MD   Chief Complaint  Patient presents with   Hypertension   Subjective    Hypertension Associated symptoms include shortness of breath. Pertinent negatives include no chest pain.    Hypertension, follow-up  BP Readings from Last 3 Encounters:  08/02/22 112/69  08/02/22 (!) 144/62  07/11/22 (!) 147/97   Wt Readings from Last 3 Encounters:  08/02/22 166 lb (75.3 kg)  07/11/22 167 lb (75.8 kg)  05/15/22 165 lb 2 oz (74.9 kg)     He was last seen for hypertension 4 months ago.  BP at that visit was 144/62. Management since that visit includes no changes.  He reports excellent compliance with treatment. He is not having side effects.  He is following a Regular diet. He is not exercising. He does not smoke.  Use of agents associated with hypertension: none.   Outside blood pressures are not being checked. Symptoms: No chest pain No chest pressure  No palpitations No syncope  Yes dyspnea No orthopnea  No paroxysmal nocturnal dyspnea No lower extremity edema   Pertinent labs Lab Results  Component Value Date   CHOL 178 10/25/2020   HDL 41 10/25/2020   LDLCALC 110 (H) 10/25/2020   TRIG 153 (H) 10/25/2020   CHOLHDL 4.3 10/25/2020   Lab Results  Component Value Date   NA 138 07/11/2022   K 4.4 07/11/2022   CREATININE 0.96 07/11/2022   GFRNONAA >60 07/11/2022   GLUCOSE 176 (H) 07/11/2022   TSH 3.320 03/27/2022     The ASCVD Risk score (Arnett DK, et al., 2019) failed to calculate for the following reasons:   The 2019 ASCVD risk score is only valid  for ages 69 to 31  --------------------------------------------------------------------------------------------------- Diabetes Mellitus Type II, Follow-up  Lab Results  Component Value Date   HGBA1C 6.5 (H) 03/27/2022   HGBA1C 6.0 (H) 11/15/2021   HGBA1C 6.1 (A) 06/12/2021   Wt Readings from Last 3 Encounters:  08/02/22 166 lb (75.3 kg)  07/11/22 167 lb (75.8 kg)  05/15/22 165 lb 2 oz (74.9 kg)   Last seen for diabetes 4 months ago.  Management since then includes no changes. Patient reports [facility] did A1c last week. He reports  no oral medication.  Symptoms: Yes fatigue No foot ulcerations  No appetite changes No nausea  Yes paresthesia of the feet  No polydipsia  No polyuria No visual disturbances   No vomiting     Home blood sugar records:  not being checked  Episodes of hypoglycemia? No    Current insulin regiment: none Most Recent Eye Exam: UTD Current exercise: none Current diet habits: in general, a "healthy" diet    Pertinent Labs: Lab Results  Component Value Date   CHOL 178 10/25/2020   HDL 41 10/25/2020   LDLCALC 110 (H) 10/25/2020   TRIG 153 (H) 10/25/2020   CHOLHDL 4.3 10/25/2020   Lab Results  Component Value Date   NA 138 07/11/2022   K 4.4 07/11/2022   CREATININE 0.96 07/11/2022   GFRNONAA >60 07/11/2022   MICROALBUR 50 10/30/2016   LABMICR See below: 08/24/2021     ---------------------------------------------------------------------------------------------------  Medications: Outpatient Medications Prior to Visit  Medication Sig   alendronate (FOSAMAX) 70 MG tablet TAKE 1 TABLET BY MOUTH ONCE A WEEK WITH  A  FULL  GLASS  OF  WATER  ON  AN  EMPTY  STOMACH   aspirin EC 81 MG tablet Take 1 tablet (81 mg total) by mouth daily.   atorvastatin (LIPITOR) 40 MG tablet TAKE 1 TABLET BY MOUTH ONCE DAILY . APPOINTMENT REQUIRED FOR FUTURE REFILLS   donepezil (ARICEPT) 5 MG tablet Take 1 tablet by mouth daily.   latanoprost (XALATAN) 0.005 %  ophthalmic solution Place 1 drop into both eyes at bedtime.    levothyroxine (SYNTHROID) 50 MCG tablet Take 1 tablet by mouth once daily   metoprolol tartrate (LOPRESSOR) 25 MG tablet TAKE 1/2 (ONE-HALF) TABLET BY MOUTH TWICE DAILY.   pantoprazole (PROTONIX) 40 MG tablet Take 1 tablet by mouth twice daily   warfarin (COUMADIN) 3 MG tablet TAKE 1 TO 1.5 TABLETS BY MOUTH ONCE DAILY AS DIRECTED   amoxicillin (AMOXIL) 500 MG tablet Take 1 tablet (500 mg total) by mouth as needed (TAKE AS NEEDED FOR ANY DENTAL PROCEDURES). Take 4 tablets 30-60 minutes prior to any dental work (Patient not taking: Reported on 08/02/2022)   No facility-administered medications prior to visit.    Review of Systems  Constitutional:  Positive for fatigue. Negative for appetite change.  Eyes:  Positive for visual disturbance.  Respiratory:  Positive for shortness of breath. Negative for chest tightness.   Cardiovascular:  Negative for chest pain and leg swelling.  Gastrointestinal:  Negative for abdominal pain, diarrhea, nausea and vomiting.       Objective    BP 112/69 (BP Location: Left Arm, Patient Position: Sitting, Cuff Size: Large)   Pulse 71   Temp 98.5 F (36.9 C) (Oral)   Resp 16   Wt 166 lb (75.3 kg)   SpO2 96%   BMI 24.51 kg/m  BP Readings from Last 3 Encounters:  08/02/22 112/69  08/02/22 (!) 144/62  07/11/22 (!) 147/97   Wt Readings from Last 3 Encounters:  08/02/22 166 lb (75.3 kg)  07/11/22 167 lb (75.8 kg)  05/15/22 165 lb 2 oz (74.9 kg)      Physical Exam Constitutional:      General: He is not in acute distress.    Appearance: Normal appearance. He is not diaphoretic.  HENT:     Head: Normocephalic.  Eyes:     Conjunctiva/sclera: Conjunctivae normal.  Pulmonary:     Effort: Pulmonary effort is normal. No respiratory distress.  Neurological:     Mental Status: He is alert. Mental status is at baseline.       No results found for any visits on 08/02/22.  Assessment &  Plan     Problem List Items Addressed This Visit       Cardiovascular and Mediastinum   Chronic atrial fibrillation ([place])    Managed by cardiology Anticoagulated with warfarin and INR is managed by cardiology Currently rate controlled Continue metoprolol at current dose      Atrial fibrillation and flutter ([place])   Hypertension associated with diabetes ([place])    Chronic and well-controlled Continue current medications Reviewed recent metabolic panel      Mediastinal lymphadenopathy    Followed by oncology Per last oncology note, imaging and testing has been reassuring, but low-grade neoplasm cannot be ruled out Patient's wife would like to know if it is cancer or not, but this has been discussed with her  that the lesions do not seem to be life-threatening in the context of the patient's age and multiple comorbidities and that biopsy is not warranted at this time        Digestive   Cirrhosis of liver without ascites, unspecified hepatic cirrhosis type ([place])    Noted on imaging years ago He was seen by Dr. Verl Blalock in 2020, and was supposed to have AFP tested He has had no follow-up since that time He and his wife are unclear of the current status of his cirrhosis I will refer back to GI for further evaluation and management He has appears stable and euvolemic today without any signs of ascites      Relevant Orders   Ambulatory referral to Gastroenterology     Endocrine   Diabetes mellitus, type 2 ([place]) - Primary    Patient's last visit with his previous PCP, his wife states he was told he no longer has diabetes and his metformin was stopped Of note, he did not tolerate metformin due to diarrhea Discussed that once you have diabetes you always have diabetes, but he does have well-controlled diabetes at this time Plan to update his screenings at next visit and recheck A1c at that time May consider other medications, but discussed goal A1c less than 7 or may be 7-1/2 given his  age and comorbidities      Diabetic neuropathy ([place])    Chronic and stable Fairly well controlled without medications at this time      Adult hypothyroidism    Previously well controlled Continue Synthroid at current dose  Recheck TSH at next visit and adjust Synthroid as indicated          Nervous and Auditory   Moderate dementia without behavioral disturbance, psychotic disturbance, mood disturbance, or anxiety ([place])    Recent diagnosis by the [facility] Was started on Aricept He is tolerating this well His physical therapist mentioned cognitive therapy with [facility] Forms completed for him to start [facility] for cognitive therapy      Relevant Medications   donepezil (ARICEPT) 5 MG tablet     Other   History of stroke    No recent stroke Noted on CT and MRI in 2011 to have evidence of previous strokes Continue statin and aspirin        Return in about 3 months (around 11/02/2022) for chronic disease f/u.      Total time spent on today's visit was greater than 40 minutes, including both face-to-face time and nonface-to-face time personally spent on review of chart (labs and imaging), discussing labs and goals, discussing further work-up, treatment options, referrals to specialist, reviewing outside records, answering patient's questions, and coordinating care.   I, Lavon Paganini, MD, have reviewed all documentation for this visit. The documentation on 08/03/22 for the exam, diagnosis, procedures, and orders are all accurate and complete.   Nicholes Hibler, Dionne Bucy, MD, MPH [facility]

## 2022-08-01 NOTE — Therapy (Unsigned)
OUTPATIENT PHYSICAL THERAPY treatment note     Patient Name: [name] [name] MRN: [id number] DOB:1934-10-04, 88 y.o., male Today's Date: 08/02/2022  PCP: [name], MD REFERRING PROVIDER: [name], MD   PT End of Session - 08/02/22 1300     Visit Number 28    Number of Visits 48    Date for PT Re-Evaluation 09/04/22    Authorization Type [employer]    Authorization Time Period -09/04/22    Progress Note Due on Visit 30    PT Start Time 1107    PT Stop Time 1144    PT Time Calculation (min) 37 min    Equipment Utilized During Treatment Gait belt    Activity Tolerance Patient tolerated treatment well    Behavior During Therapy WFL for tasks assessed/performed              Vitals:   08/02/22 1113  BP: (!) 144/62  Pulse: 86  SpO2: 98%                Past Medical History:  Diagnosis Date   Aortic stenosis    a. 01/2016 echo: mod AS; b. 04/2018 Cardiac MRI: sev AS; c. 05/2018 s/p mech AVR @ time of myomectomy; d. 07/2018 Echo: Mild AS/AI.   Atherosclerosis of aorta ([place])    by CT scan in past   Atrial fibrillation ([facility])    and aflutter. pt has a left atrial circuit that is not ablated. was on amiodarone-stopped, now use rate control.    Bladder cancer ([place])    hx; treated with BCG in past    Carotid artery disease ([place])    There was calcified plaque but no stenosis by carotid artery screening done at [facility] October, 2013   CHB (complete heart block) [facility])    a. 05/2018 s/p BSX VVIR PPM ([facility]) in setting of bradycardia following myomectomy and AVR/MVR.   Diabetes mellitus    type not specified   Diabetic neuropathy ([place])    feet   Elevated liver enzymes    over time; hx   Excessive sweating    Glaucoma    Gout    Head injury    when slipped on ice 2004-2005. stabilized and back on Coumadin    Headache    migraines - distant past   History of cardiac cath    a. 05/2018 Cath ([facility]):  Non-obstructive CAD. Mildly elevated filling pressures w/ nl CI.   HOCM (hypertrophic obstructive cardiomyopathy) ([place])    a. 01/2016 Echo: EF 65-70%, no rwma, LVOT gradient of 80-82mHg, mod AS, SAM; b. 11/2017 Echo: EF 55-60%, near cavity obliteration in systole, mod AS, mild MS; c. 04/2018 Card MRI ([facility]): Sev LVH, EF >70%, Sev AS, LVOT obs w/ Sev MR, mild to mod TR/PR, mid-myocardial HE in basal-mid anteroseptum and inferoseptum; d. 05/2018 s/p Septal myomectomy; e. 07/2018 Echo: EF 60-65%. PASP 371mg.   Homocystinemia    elevated, mild    Hypercholesterolemia    treated.    Hypertension    Infection of right inner ear    Kidney mass    a. s/p laproscopic surgery woth cryoablation of a mass outside [facility] Chest: 2cm R kidney mass.   Mediastinal adenopathy    a. 12/208 CT Chest: up to 58m24mLL lung nodule. 1.5-1.7cm subcarinal/mediastinal adenopahty/right paratracheal lymph node; b. 11/2018 CT Chest: 2.5-3cm mediastinal adenopathy.   Mitral regurgitation    a. 04/2018 Cardiac  MRI: sev MR; b. 05/2018 s/p mech MVR @ time of myomectomy; c. 07/2018 Echo: Mild MS.   Motion sickness    ocean boats   Nausea    Nonobstructive coronary artery disease    a. 05/2018 Cath ([facility]): nonobs CAD.   Obstructive sleep apnea    CPAP started successfully 2014   Orthostatic hypotension    a. orthostatic. dehydration. hospitalized 11/11   Sleep apnea    Significant obstructive sleep apnea diagnosed in August, 2012, the patient is to receive CPAP    Stroke [facility])    "2 old strokes" CT and MRI. [facility] 11[facility]11. diagnosis was dehydration, no acutal reports.    TSH elevation    on synthroid historically    Unsteady gait    August, 2012   Past Surgical History:  Procedure Laterality Date   BLADDER SURGERY     CATARACT EXTRACTION W/PHACO Left 05/11/2015   Procedure: CATARACT EXTRACTION PHACO AND INTRAOCULAR LENS PLACEMENT ([place]);  Surgeon: Leandrew Koyanagi, MD;  Location:  [place];  Service: Ophthalmology;  Laterality: Left;  DIABETIC - oral meds, CPAP   COLONOSCOPY WITH PROPOFOL N/A 04/14/2019   Procedure: COLONOSCOPY WITH PROPOFOL;  Surgeon: [name], MD;  Location: [facility] ENDOSCOPY;  Service: Endoscopy;  Laterality: N/A;   ESOPHAGOGASTRODUODENOSCOPY (EGD) WITH PROPOFOL N/A 07/01/2018   Procedure: ESOPHAGOGASTRODUODENOSCOPY (EGD) WITH PROPOFOL;  Surgeon: [name], MD;  Location: [facility] ENDOSCOPY;  Service: Endoscopy;  Laterality: N/A;   INSERT / REPLACE / [place]     "froze mass"   MECHANICAL AORTIC AND MITRAL VALVE REPLACEMENT  05/2018   [facility]    TONSILLECTOMY     VALVE REPLACEMENT     Patient Active Problem List   Diagnosis Date Noted   Other abnormalities of gait and mobility 11/15/2021   Depression 11/15/2021   Encounter for fitting and adjustment of hearing aid 11/15/2021   Abnormal CT scan    Abnormal findings on radiological examination of gastrointestinal tract    Polyp of colon    Cancer of right kidney ([place]) 12/10/2018   Pulmonary nodules 12/01/2018   Mediastinal lymphadenopathy 12/01/2018   Heart block AV third degree ([place]) 10/07/2018   Pacemaker 10/07/2018   H/O aortic valve replacement 10/07/2018   Blood in stool    Bleeding duodenal ulcer    GIB (gastrointestinal bleeding) 06/30/2018   Acute upper GI bleed    Decreased activities of daily living (ADL) 06/09/2018   Decreased strength, endurance, and mobility 06/09/2018   Pleural effusion, right 06/09/2018   S/P AVR (aortic valve replacement) 06/08/2018   H/O ventricular septal myectomy 06/08/2018   Chronic anticoagulation 06/02/2018   Heart murmur, systolic 20/35/5974   H/O: rheumatic fever 03/21/2017   Tinnitus 10/30/2016   On warfarin therapy 07/06/2015   Arteriosclerosis of coronary artery 06/13/2015   Carcinoma in situ of bladder 06/13/2015   Diabetes mellitus, type 2 ([place]) 06/13/2015   Diabetic neuropathy  ([place]) 06/13/2015   Dizziness 06/13/2015   Essential (primary) hypertension 06/13/2015   Fatigue 06/13/2015   Glaucoma 06/13/2015   Hypercholesteremia 06/13/2015   Male hypogonadism 06/13/2015   Adult hypothyroidism 06/13/2015   Arthritis, degenerative 06/13/2015   CAP (community acquired pneumonia) 06/13/2015   Adenocarcinoma, renal cell ([place]) 06/13/2015   Benign head tremor 12/13/2014   Carotid arterial disease ([place]) 07/12/2014   Decreased cardiac output 07/12/2014   Cardiomyopathy, hypertrophic obstructive ([place]) 07/12/2014   Head injuries 07/12/2014   HOCM (hypertrophic  obstructive cardiomyopathy) ([place])    Encounter for therapeutic drug monitoring 11/16/2013   Obstructive sleep apnea    Benign prostatic hypertrophy without urinary obstruction 06/09/2012   History of neoplasm of bladder 06/09/2012   Head injury    Atherosclerosis of aorta (HCC)    Hypotension    Stroke ([place])    Unsteady gait    Excessive sweating    Nausea    ORTHOSTATIC HYPOTENSION, HX OF 09/01/2010   Overweight 03/07/2009   MITRAL REGURGITATION 03/05/2009   Chronic atrial fibrillation ([place]) 03/05/2009   Atrial fibrillation and flutter ([place]) 03/05/2009   Mitral and aortic incompetence 03/05/2009      REFERRING DIAG: Imbalance  THERAPY DIAG:  Difficulty in walking, not elsewhere classified  Other abnormalities of gait and mobility  Unsteadiness on feet  Rationale for Evaluation and Treatment Rehabilitation  PERTINENT HISTORY:  Pt report she has been having balance issues secondary to his ears. Pt was in the artilerary. Pt sold a home in april and has fallen 5 times since then. Pt tripped over a piece of wood in back yard and fell on one occassion. Pt has history of cardiac rehab here at [facility]. Pt has a very difficult time with balance without his cane. He has difficulty with balance when he turns his head.   PRECAUTIONS: Fall  SUBJECTIVE: Patient caregiver reports recent diagnosis of dementia  which is troubling her.  Patient caregiver also wants to consider discharge date for about 3 to 4 weeks secondary to busyness of schedule.  Physical therapist is comfortable with this plan and will work toward progressing patient to discharge over the next upcoming physical therapy sessions in addition to continuing to challenge his balance and mobility.  Presents to therapy with his wife; Tiffany Kocher  PAIN:  Are you having pain? No     TODAY'S TREATMENT: 08/02/22  Exercise/Activity Sets/Reps/Time/ Resistance Assistance Charge type Comments  STS with airex pad and no UE support  2*9 reps (sets between walking intervals)  CGA, TA Improved form with no need for UE assist, cues for full upright stance and anterior weight shift.   Standing on airex reaching and picking up cones (Functional squat) 2*10 cones on various height steps anterior With 2 cones directly on floor CGA TA Improved efficacy with each attempts  Ambulaiton with 2# AW  2 x 325 feet with 2 min rest and STS in between and with 2# Aw donned SPC TA LOB with retro ambulation and with transition due to NBOS, cues did not ipmrove NBOS balance reaction strategy   Pt tested for orthostatics due to some dizziness with standing, pt had systolic drop in BP, but not to low levels, see vitals for details.       Treatment Provided this session   Pt educated throughout session about proper posture and technique with exercises. Improved exercise technique, movement at target joints, use of target muscles after min to mod verbal, visual, tactile cues.  Note: Portions of this document were prepared using Dragon voice recognition software and although reviewed may contain unintentional dictation errors in syntax, grammar, or spelling.             PATIENT EDUCATION: Education details: Education regarding when to use of EMS services and education regarding what the services entail the patient is to have a fall. Person educated:  Patient Education method: Explanation, Demonstration, Tactile cues, and Verbal cues Education comprehension: verbalized understanding, returned demonstration, and verbal cues required   HOME EXERCISE PROGRAM:  Access Code:  [email or web address] URL: [email or web address] Date: 04/26/2022 Prepared by: [place]  Exercises - Standing March with Counter Support  - 1 x daily - 7 x weekly - 3 sets - 10 reps - Standing Knee Flexion with Unilateral Counter Support  - 1 x daily - 7 x weekly - 3 sets - 10 reps - Heel Toe Raises with Unilateral Counter Support  - 1 x daily - 7 x weekly - 3 sets - 10 reps - Standing Hip Abduction with Unilateral Counter Support  - 1 x daily - 7 x weekly - 3 sets - 10 reps - Standing Hip Extension with Unilateral Counter Support  - 1 x daily - 7 x weekly - 3 sets - 10 reps   PT Short Term Goals        PT SHORT TERM GOAL #1   Title Patient will be independent in home exercise program to improve strength/mobility for better functional independence with ADLs.    Baseline no HEP 8/15: Not completing regularly, lots of discussion about importance of comliance    Time 4    Period Weeks    Status Progressing    Target Date 04/16/22              PT Long Term Goals       PT LONG TERM GOAL #1   Title Patient will increase FOTO score to equal to or greater than   48  to demonstrate statistically significant improvement in mobility and quality of life.    Baseline 42 9/19:54   Time 8    Period Weeks    Status Met    Target Date 06/11/22      PT LONG TERM GOAL #2   Title Patient  will complete five times sit to stand test in < 17 seconds indicating an increased LE strength and improved balance.    Baseline 23.53 sec 8/15: 17 sec heavy UE use  9/19: 15.8 sec   Time 12    Period Weeks    Status Met          PT LONG TERM GOAL #3   Title Patient will increase Berg Balance score by > 6 points to demonstrate decreased fall risk during functional  activities.    Baseline 34,  8/15:38 9/19: test next session 9/21:43   Time 12    Period Weeks    Status Partially met       PT LONG TERM GOAL #4   Title Patient will increase six minute walk test distance to >1000 for progression to community ambulator and improve gait ability    Baseline 725 feet 9/19: 745 feet without AD    Time 12    Period Weeks    Status Partially met     Target Date 09/04/22      PT LONG TERM GOAL #5   Title Patient will increase 10 meter walk test to >1.70ms as to improve gait speed for better community ambulation and to reduce fall risk.    Baseline .58 m/s 9/19: .65 m/s   Time 12    Period Weeks    Status Ongoing     Target Date 09/04/22                    Plan     Clinical Impression Statement  Patient continues to demonstrate great motivation for physical therapy activities.  Pt educated regarding memory therapy and order being sent to new PCP regarding ne diagnosis of dementia.  Patient caregiver comfortable with this plan.  Patient continues to progress with lower extremity strength and mobility as well as functional task which is squatting and reaching where he most recently had a fall.  Patient also implored to continue with exercise in addition to his normal physical activity in order to maintain his current level of function. Pt plan is to discharge early November with HEP and also begin therapy for memory/ cognitive decline following signed order sent to MD.   Patient will continue to benefit with physical therapy to improve his mobility, balance, prevent falls, and improve safety at home in the community.       Personal Factors and Comorbidities Age;Comorbidity 1;Comorbidity 2;Comorbidity 3+    Comorbidities CAD, Peripheral neuropathy, T2DM, HTN,    Examination-Activity Limitations Caring for Others;Carry;Locomotion Level;Stairs;Stand    Examination-Participation Restrictions Yard Work;Shop;Community Activity    Stability/Clinical  Decision Making Evolving/Moderate complexity    Rehab Potential Fair    PT Frequency 2x / week    PT Duration 12 weeks    PT Treatment/Interventions ADLs/Self Care Home Management;Moist Heat;DME Instruction;Stair training;Functional mobility training;Therapeutic activities;Therapeutic exercise;Balance training;Neuromuscular re-education;Patient/family education;Wheelchair mobility training;Manual techniques;Dry needling;Energy conservation    PT Next Visit Plan test 6MWT and 10MWT, also establish HEP for 2 weeks pt will be at [place] Provided visit 2    Consulted and Agree with Plan of Care Patient;Family member/caregiver                Particia Lather PT, DPT  08/02/22 1:04 PM   08/02/22, 1:04 PM

## 2022-08-02 ENCOUNTER — Ambulatory Visit (INDEPENDENT_AMBULATORY_CARE_PROVIDER_SITE_OTHER): Payer: PPO | Admitting: Family Medicine

## 2022-08-02 ENCOUNTER — Encounter: Payer: Self-pay | Admitting: Physical Therapy

## 2022-08-02 ENCOUNTER — Ambulatory Visit: Payer: PPO | Admitting: Physical Therapy

## 2022-08-02 ENCOUNTER — Encounter: Payer: Self-pay | Admitting: Family Medicine

## 2022-08-02 VITALS — BP 112/69 | HR 71 | Temp 98.5°F | Resp 16 | Wt 166.0 lb

## 2022-08-02 VITALS — BP 144/62 | HR 86

## 2022-08-02 DIAGNOSIS — F03B0 Unspecified dementia, moderate, without behavioral disturbance, psychotic disturbance, mood disturbance, and anxiety: Secondary | ICD-10-CM

## 2022-08-02 DIAGNOSIS — K7460 Unspecified cirrhosis of liver: Secondary | ICD-10-CM

## 2022-08-02 DIAGNOSIS — E1142 Type 2 diabetes mellitus with diabetic polyneuropathy: Secondary | ICD-10-CM | POA: Diagnosis not present

## 2022-08-02 DIAGNOSIS — I482 Chronic atrial fibrillation, unspecified: Secondary | ICD-10-CM

## 2022-08-02 DIAGNOSIS — R262 Difficulty in walking, not elsewhere classified: Secondary | ICD-10-CM | POA: Diagnosis not present

## 2022-08-02 DIAGNOSIS — Z8673 Personal history of transient ischemic attack (TIA), and cerebral infarction without residual deficits: Secondary | ICD-10-CM | POA: Diagnosis not present

## 2022-08-02 DIAGNOSIS — I152 Hypertension secondary to endocrine disorders: Secondary | ICD-10-CM | POA: Diagnosis not present

## 2022-08-02 DIAGNOSIS — E039 Hypothyroidism, unspecified: Secondary | ICD-10-CM | POA: Diagnosis not present

## 2022-08-02 DIAGNOSIS — I4892 Unspecified atrial flutter: Secondary | ICD-10-CM | POA: Diagnosis not present

## 2022-08-02 DIAGNOSIS — R2689 Other abnormalities of gait and mobility: Secondary | ICD-10-CM

## 2022-08-02 DIAGNOSIS — I4891 Unspecified atrial fibrillation: Secondary | ICD-10-CM

## 2022-08-02 DIAGNOSIS — R590 Localized enlarged lymph nodes: Secondary | ICD-10-CM | POA: Diagnosis not present

## 2022-08-02 DIAGNOSIS — E1159 Type 2 diabetes mellitus with other circulatory complications: Secondary | ICD-10-CM | POA: Diagnosis not present

## 2022-08-02 DIAGNOSIS — R2681 Unsteadiness on feet: Secondary | ICD-10-CM

## 2022-08-03 ENCOUNTER — Encounter: Payer: Self-pay | Admitting: Family Medicine

## 2022-08-03 NOTE — Assessment & Plan Note (Signed)
Previously well controlled Continue Synthroid at current dose  Recheck TSH at next visit and adjust Synthroid as indicated

## 2022-08-03 NOTE — Assessment & Plan Note (Signed)
Patient's last visit with his previous PCP, his wife states he was told he no longer has diabetes and his metformin was stopped Of note, he did not tolerate metformin due to diarrhea Discussed that once you have diabetes you always have diabetes, but he does have well-controlled diabetes at this time Plan to update his screenings at next visit and recheck A1c at that time May consider other medications, but discussed goal A1c less than 7 or may be 7-1/2 given his age and comorbidities

## 2022-08-03 NOTE — Assessment & Plan Note (Signed)
Chronic and stable Fairly well controlled without medications at this time

## 2022-08-03 NOTE — Assessment & Plan Note (Signed)
Chronic and well-controlled Continue current medications Reviewed recent metabolic panel 

## 2022-08-03 NOTE — Assessment & Plan Note (Signed)
Followed by oncology Per last oncology note, imaging and testing has been reassuring, but low-grade neoplasm cannot be ruled out Patient's wife would like to know if it is cancer or not, but this has been discussed with her that the lesions do not seem to be life-threatening in the context of the patient's age and multiple comorbidities and that biopsy is not warranted at this time

## 2022-08-03 NOTE — Assessment & Plan Note (Signed)
No recent stroke Noted on CT and MRI in 2011 to have evidence of previous strokes Continue statin and aspirin

## 2022-08-03 NOTE — Assessment & Plan Note (Signed)
Managed by cardiology Anticoagulated with warfarin and INR is managed by cardiology Currently rate controlled Continue metoprolol at current dose

## 2022-08-03 NOTE — Assessment & Plan Note (Signed)
Noted on imaging years ago He was seen by Dr. Verl Blalock in 2020, and was supposed to have AFP tested He has had no follow-up since that time He and his wife are unclear of the current status of his cirrhosis I will refer back to GI for further evaluation and management He has appears stable and euvolemic today without any signs of ascites

## 2022-08-03 NOTE — Assessment & Plan Note (Signed)
[facility] started on Aricept He is tolerating this well His physical therapist mentioned cognitive therapy with [facility] Forms completed for him to start [facility] for cognitive therapy

## 2022-08-06 DIAGNOSIS — G4733 Obstructive sleep apnea (adult) (pediatric): Secondary | ICD-10-CM | POA: Diagnosis not present

## 2022-08-07 ENCOUNTER — Ambulatory Visit: Payer: PPO

## 2022-08-07 DIAGNOSIS — R262 Difficulty in walking, not elsewhere classified: Secondary | ICD-10-CM | POA: Diagnosis not present

## 2022-08-07 DIAGNOSIS — M6281 Muscle weakness (generalized): Secondary | ICD-10-CM

## 2022-08-07 DIAGNOSIS — R2681 Unsteadiness on feet: Secondary | ICD-10-CM

## 2022-08-07 DIAGNOSIS — Z950 Presence of cardiac pacemaker: Secondary | ICD-10-CM | POA: Diagnosis not present

## 2022-08-07 DIAGNOSIS — R2689 Other abnormalities of gait and mobility: Secondary | ICD-10-CM

## 2022-08-07 NOTE — Therapy (Signed)
OUTPATIENT PHYSICAL THERAPY treatment note     Patient Name: [name] [name] MRN: [id number] DOB:[birth year], 88 y.o., male Today's Date: 08/07/2022  PCP: [name], MD REFERRING PROVIDER: [name], MD   PT End of Session - 08/07/22 1119     Visit Number 29    Number of Visits 71    Date for PT Re-Evaluation 09/04/22    Authorization Type [employer]    Authorization Time Period -09/04/22    Progress Note Due on Visit 30    PT Start Time 1102    PT Stop Time 1140    PT Time Calculation (min) 38 min    Equipment Utilized During Treatment Gait belt    Activity Tolerance Patient tolerated treatment well;No increased pain    Behavior During Therapy [facility] for tasks assessed/performed              There were no vitals filed for this visit.               Past Medical History:  Diagnosis Date   Acute upper GI bleed    Aortic stenosis    a. 01/2016 echo: mod AS; b. 04/2018 Cardiac MRI: sev AS; c. 05/2018 s/p mech AVR @ time of myomectomy; d. 07/2018 Echo: Mild AS/AI.   Atherosclerosis of aorta ([place])    by CT scan in past   Atrial fibrillation (HCC)    and aflutter. pt has a left atrial circuit that is not ablated. was on amiodarone-stopped, now use rate control.    Bladder cancer ([place])    hx; treated with BCG in past    Bleeding duodenal ulcer    CAP (community acquired pneumonia) 06/13/2015   Carotid artery disease ([place])    There was calcified plaque but no stenosis by carotid artery screening done at [facility] October, 2013   CHB (complete heart block) [facility])    a. 05/2018 s/p BSX VVIR PPM ([facility]) in setting of bradycardia following myomectomy and AVR/MVR.   Diabetes mellitus    type not specified   Diabetic neuropathy ([place])    feet   Elevated liver enzymes    over time; hx   Excessive sweating    Glaucoma    Gout    Head injury    when slipped on ice 2004-2005. stabilized and back on Coumadin    Headache     migraines - distant past   History of cardiac cath    a. 05/2018 Cath ([facility]): Non-obstructive CAD. Mildly elevated filling pressures w/ nl CI.   HOCM (hypertrophic obstructive cardiomyopathy) ([place])    a. 01/2016 Echo: EF 65-70%, no rwma, LVOT gradient of 80-42mHg, mod AS, SAM; b. 11/2017 Echo: EF 55-60%, near cavity obliteration in systole, mod AS, mild MS; c. 04/2018 Card MRI ([facility]): Sev LVH, EF >70%, Sev AS, LVOT obs w/ Sev MR, mild to mod TR/PR, mid-myocardial HE in basal-mid anteroseptum and inferoseptum; d. 05/2018 s/p Septal myomectomy; e. 07/2018 Echo: EF 60-65%. PASP 331mg.   Homocystinemia    elevated, mild    Hypercholesterolemia    treated.    Hypertension    Infection of right inner ear    Kidney mass    a. s/p laproscopic surgery woth cryoablation of a mass outside [facility]2018 CT Chest: 2cm R kidney mass.   Mediastinal adenopathy    a. 12/208 CT Chest: up to 7m54mLL lung nodule. 1.5-1.7cm subcarinal/mediastinal adenopahty/right paratracheal lymph node; b. 11/2018 CT  Chest: 2.5-3cm mediastinal adenopathy.   Mitral regurgitation    a. 04/2018 Cardiac MRI: sev MR; b. 05/2018 s/p mech MVR @ time of myomectomy; c. 07/2018 Echo: Mild MS.   Motion sickness    ocean boats   Nausea    Nonobstructive coronary artery disease    a. 05/2018 Cath ([facility]): nonobs CAD.   Obstructive sleep apnea    CPAP started successfully 2014   Orthostatic hypotension    a. orthostatic. dehydration. hospitalized 11/11   Sleep apnea    Significant obstructive sleep apnea diagnosed in August, 2012, the patient is to receive CPAP    Stroke [facility])    "2 old strokes" CT and MRI. [place] [facility] 11/11. diagnosis was dehydration, no acutal reports.    TSH elevation    on synthroid historically    Unsteady gait    August, 2012   Past Surgical History:  Procedure Laterality Date   BLADDER SURGERY     CATARACT EXTRACTION W/PHACO Left 05/11/2015   Procedure: CATARACT EXTRACTION PHACO AND  INTRAOCULAR LENS PLACEMENT ([place]);  Surgeon: Leandrew Koyanagi, MD;  Location: [place];  Service: Ophthalmology;  Laterality: Left;  DIABETIC - oral meds, CPAP   COLONOSCOPY WITH PROPOFOL N/A 04/14/2019   Procedure: COLONOSCOPY WITH PROPOFOL;  Surgeon: [name], MD;  [facility] ENDOSCOPY;  Service: Endoscopy;  Laterality: N/A;   ESOPHAGOGASTRODUODENOSCOPY (EGD) WITH PROPOFOL N/A 07/01/2018   Procedure: ESOPHAGOGASTRODUODENOSCOPY (EGD) WITH PROPOFOL;  Surgeon: [name], MD;  Location: [facility] ENDOSCOPY;  Service: Endoscopy;  Laterality: N/A;   INSERT / REPLACE / [place]     "froze mass"   MECHANICAL AORTIC AND MITRAL VALVE REPLACEMENT  05/2018   [facility]    TONSILLECTOMY     VALVE REPLACEMENT     Patient Active Problem List   Diagnosis Date Noted   Cirrhosis of liver without ascites, unspecified hepatic cirrhosis type ([place]) 08/02/2022   Moderate dementia without behavioral disturbance, psychotic disturbance, mood disturbance, or anxiety ([place]) 08/02/2022   Other abnormalities of gait and mobility 11/15/2021   Depression 11/15/2021   Abnormal CT scan    Abnormal findings on radiological examination of gastrointestinal tract    Polyp of colon    Cancer of right kidney ([place]) 12/10/2018   Pulmonary nodules 12/01/2018   Mediastinal lymphadenopathy 12/01/2018   Heart block AV third degree ([place]) 10/07/2018   Pacemaker 10/07/2018   H/O aortic valve replacement 10/07/2018   Blood in stool    Decreased activities of daily living (ADL) 06/09/2018   Decreased strength, endurance, and mobility 06/09/2018   Pleural effusion, right 06/09/2018   S/P AVR (aortic valve replacement) 06/08/2018   H/O ventricular septal myectomy 06/08/2018   Chronic anticoagulation 06/02/2018   Heart murmur, systolic 78/46/9629   H/O: rheumatic fever 03/21/2017   Tinnitus 10/30/2016   On warfarin therapy 07/06/2015   Arteriosclerosis of coronary artery  06/13/2015   Carcinoma in situ of bladder 06/13/2015   Diabetes mellitus, type 2 ([place]) 06/13/2015   Diabetic neuropathy ([place]) 06/13/2015   Dizziness 06/13/2015   Hypertension associated with diabetes ([place]) 06/13/2015   Fatigue 06/13/2015   Glaucoma 06/13/2015   Hypercholesteremia 06/13/2015   Male hypogonadism 06/13/2015   Adult hypothyroidism 06/13/2015   Arthritis, degenerative 06/13/2015   Adenocarcinoma, renal cell ([place]) 06/13/2015   Benign head tremor 12/13/2014   Carotid arterial disease ([place]) 07/12/2014   Decreased cardiac output 07/12/2014   Cardiomyopathy, hypertrophic obstructive ([place]) 07/12/2014  Head injuries 07/12/2014   HOCM (hypertrophic obstructive cardiomyopathy) ([place])    Encounter for therapeutic drug monitoring 11/16/2013   Obstructive sleep apnea    Benign prostatic hypertrophy without urinary obstruction 06/09/2012   History of neoplasm of bladder 06/09/2012   Head injury    Atherosclerosis of aorta (HCC)    History of stroke    Unsteady gait    Excessive sweating    MITRAL REGURGITATION 03/05/2009   Chronic atrial fibrillation ([place]) 03/05/2009   Atrial fibrillation and flutter ([place]) 03/05/2009   Mitral and aortic incompetence 03/05/2009      REFERRING DIAG: Imbalance  THERAPY DIAG:  Difficulty in walking, not elsewhere classified  Other abnormalities of gait and mobility  Unsteadiness on feet  Muscle weakness (generalized)  Rationale for Evaluation and Treatment Rehabilitation  PERTINENT HISTORY:  Pt report she has been having balance issues secondary to his ears. Pt was in the artilerary. Pt sold a home in april and has fallen 5 times since then. Pt tripped over a piece of wood in back yard and fell on one occassion. Pt has history of cardiac rehab here at [facility]. Pt has a very difficult time with balance without his cane. He has difficulty with balance when he turns his head.   PRECAUTIONS: Fall  SUBJECTIVE: Pt/wife asking about making today  the last visit, as they plan to head to [place] for the month. They are on top of HEP activity, hope to return after thanksgiving.   Presents to therapy with his wife; Tiffany Kocher  PAIN:  Are you having pain? No     TODAY'S TREATMENT: 08/07/22 -overground AMB 3lb AW bilat, 449f 454m 43sec  -seated recovery  -STS from chair, hands on knees x10 (minA for last 3)  -overground AMB 5lb AW bilat, [id number]  -seated recovery -STS from chair, hands on knees x8 3 -soft surface land course: over 25ft84fd mat, falls mat, yoga mat with 2 spaced out airex pads, 180 degree turn and perform in reverse. 3 laps consecutively.     Pt educated throughout session about proper posture and technique with exercises. Improved exercise technique, movement at target joints, use of target muscles after min to mod verbal, visual, tactile cues.  Note: Portions of this document were prepared using Dragon voice recognition software and although reviewed may contain unintentional dictation errors in syntax, grammar, or spelling.             PATIENT EDUCATION: Education details: Education regarding when to use of EMS services and education regarding what the services entail the patient is to have a fall. Person educated: Patient Education method: Explanation, Demonstration, Tactile cues, and Verbal cues Education comprehension: verbalized understanding, returned demonstration, and verbal cues required   HOME EXERCISE PROGRAM:  Access Code: [email or web address] Date: 04/26/2022 Prepared by: Josh [place] Nationrcises - Standing March with Counter Support  - 1 x daily - 7 x weekly - 3 sets - 10 reps - Standing Knee Flexion with Unilateral Counter Support  - 1 x daily - 7 x weekly - 3 sets - 10 reps - Heel Toe Raises with Unilateral Counter Support  - 1 x daily - 7 x weekly - 3 sets - 10 reps - Standing Hip Abduction with Unilateral Counter Support  - 1 x daily - 7  x weekly - 3 sets - 10 reps - Standing Hip Extension with Unilateral Counter Support  - 1 x daily - 7 x weekly - 3 sets -  10 reps   PT Short Term Goals        PT SHORT TERM GOAL #1   Title Patient will be independent in home exercise program to improve strength/mobility for better functional independence with ADLs.    Baseline no HEP 8/15: Not completing regularly, lots of discussion about importance of comliance    Time 4    Period Weeks    Status Progressing    Target Date 04/16/22              PT Long Term Goals       PT LONG TERM GOAL #1   Title Patient will increase FOTO score to equal to or greater than   48  to demonstrate statistically significant improvement in mobility and quality of life.    Baseline 42 9/19:54   Time 8    Period Weeks    Status Met    Target Date 06/11/22      PT LONG TERM GOAL #2   Title Patient  will complete five times sit to stand test in < 17 seconds indicating an increased LE strength and improved balance.    Baseline 23.53 sec 8/15: 17 sec heavy UE use  9/19: 15.8 sec   Time 12    Period Weeks    Status Met          PT LONG TERM GOAL #3   Title Patient will increase Berg Balance score by > 6 points to demonstrate decreased fall risk during functional activities.    Baseline 34,  8/15:38 9/19: test next session 9/21:43   Time 12    Period Weeks    Status Partially met       PT LONG TERM GOAL #4   Title Patient will increase six minute walk test distance to >1000 for progression to community ambulator and improve gait ability    Baseline 725 feet 9/19: 745 feet without AD    Time 12    Period Weeks    Status Partially met     Target Date 09/04/22      PT LONG TERM GOAL #5   Title Patient will increase 10 meter walk test to >1.75ms as to improve gait speed for better community ambulation and to reduce fall risk.    Baseline .58 m/s 9/19: .65 m/s   Time 12    Period Weeks    Status Ongoing     Target Date 09/04/22                     Plan     Clinical Impression Statement Patient continues to demonstrate great motivation for physical therapy activities. Finished up last session today prior to hiatus to go to beach for a month. Pt continues to show progression in mobility tolerance.  Patient will continue to benefit with physical therapy to improve his mobility, balance, prevent falls, and improve safety at home in the community.    Personal Factors and Comorbidities Age;Comorbidity 1;Comorbidity 2;Comorbidity 3+    Comorbidities CAD, Peripheral neuropathy, T2DM, HTN,    Examination-Activity Limitations Caring for Others;Carry;Locomotion Level;Stairs;Stand    Examination-Participation Restrictions Yard Work;Shop;Community Activity    Stability/Clinical Decision Making Evolving/Moderate complexity    Rehab Potential Fair    PT Frequency 2x / week    PT Duration 12 weeks    PT Treatment/Interventions ADLs/Self Care Home Management;Moist Heat;DME Instruction;Stair training;Functional mobility training;Therapeutic activities;Therapeutic exercise;Balance training;Neuromuscular re-education;Patient/family education;Wheelchair mobility training;Manual techniques;Dry needling;Energy conservation    PT  Next Visit Plan test 6MWT and 10MWT, also establish HEP for 2 weeks pt will be at [place] Provided visit 2    Consulted and Agree with Plan of Care Patient;Family member/caregiver              11:23 AM, 08/07/22 Etta Grandchild, PT, DPT Physical Therapist - [facility]  Outpatient Physical Therapy- [facility] PT, DPT  08/07/22 11:23 AM   08/07/22, 11:23 AM

## 2022-08-08 ENCOUNTER — Ambulatory Visit: Payer: PPO | Attending: Cardiovascular Disease

## 2022-08-08 DIAGNOSIS — Z5181 Encounter for therapeutic drug level monitoring: Secondary | ICD-10-CM

## 2022-08-08 DIAGNOSIS — I4892 Unspecified atrial flutter: Secondary | ICD-10-CM | POA: Diagnosis not present

## 2022-08-08 DIAGNOSIS — I4891 Unspecified atrial fibrillation: Secondary | ICD-10-CM | POA: Diagnosis not present

## 2022-08-08 DIAGNOSIS — I482 Chronic atrial fibrillation, unspecified: Secondary | ICD-10-CM

## 2022-08-08 LAB — POCT INR: INR: 3.9 — AB (ref 2.0–3.0)

## 2022-08-08 NOTE — Patient Instructions (Signed)
Continue 1.5 tablets every day EXCEPT 1 tablet on MONDAY, WEDNESDAY and FRIDAY. EAT [place]ENS TONIGHT.  Recheck INR in 4 weeks.

## 2022-08-09 ENCOUNTER — Ambulatory Visit: Payer: PPO | Admitting: Physical Therapy

## 2022-08-13 DIAGNOSIS — Z950 Presence of cardiac pacemaker: Secondary | ICD-10-CM | POA: Diagnosis not present

## 2022-08-14 ENCOUNTER — Telehealth: Payer: Self-pay

## 2022-08-14 ENCOUNTER — Ambulatory Visit: Payer: PPO | Admitting: Physical Therapy

## 2022-08-14 ENCOUNTER — Other Ambulatory Visit: Payer: Self-pay

## 2022-08-14 DIAGNOSIS — K7460 Unspecified cirrhosis of liver: Secondary | ICD-10-CM

## 2022-08-14 NOTE — Telephone Encounter (Signed)
Wife Stanton Kidney lmovm requesting call back to schedule pt an appt to f/u for cirrhosis  I called cell and home# lmovm requesting Stanton Kidney to call back to schedule appt

## 2022-08-15 ENCOUNTER — Ambulatory Visit
Admission: RE | Admit: 2022-08-15 | Discharge: 2022-08-15 | Disposition: A | Discharge status: Home / Self Care | Payer: PPO | Source: Ambulatory Visit | Attending: Gastroenterology | Admitting: Gastroenterology

## 2022-08-15 ENCOUNTER — Ambulatory Visit: Payer: PPO

## 2022-08-15 DIAGNOSIS — K7460 Unspecified cirrhosis of liver: Secondary | ICD-10-CM | POA: Insufficient documentation

## 2022-08-15 NOTE — Telephone Encounter (Signed)
Has an appt for 08/16/22

## 2022-08-16 ENCOUNTER — Ambulatory Visit: Payer: PPO | Admitting: Physical Therapy

## 2022-08-16 ENCOUNTER — Encounter: Payer: Self-pay | Admitting: Gastroenterology

## 2022-08-16 ENCOUNTER — Ambulatory Visit (INDEPENDENT_AMBULATORY_CARE_PROVIDER_SITE_OTHER): Payer: PPO | Admitting: Gastroenterology

## 2022-08-16 VITALS — BP 161/77 | HR 72 | Temp 97.7°F | Ht 69.0 in | Wt 163.0 lb

## 2022-08-16 DIAGNOSIS — K7460 Unspecified cirrhosis of liver: Secondary | ICD-10-CM | POA: Diagnosis not present

## 2022-08-16 NOTE — Progress Notes (Signed)
Gastroenterology Consultation  Referring Provider:     Virginia Crews, MD Primary Care Physician:  Virginia Crews, MD Primary Gastroenterologist:  Dr. Allen Norris     Reason for Consultation:     Cirrhosis        HPI:   [name] [name] is a 88 y.o. y/o male referred for consultation & management of cirrhosis by Dr. Brita Romp, Dionne Bucy, MD. This patient comes to see me today after being diagnosed on abdominal imaging to have a nodular liver consistent with cirrhosis.  The patient had seen me in the past because of mediastinal lymphadenopathy and the nodular liver.  The patient also had a PET scan showing abnormalities of the transverse colon which were followed up with a colonoscopy that did not show any worrisome features.  It appears that the patient had an ultrasound of the right upper quadrant yesterday.  The patient had a PET scan in March that showed lung lesions and unchanged lymphadenopathy with continued signs of cirrhosis.  The patient's wife reports that the information she has gotten about the lung lesions is that it is likely cancer but a slow growing cancer.  The patient's ultrasound yesterday still not read and the patient's alpha-fetoprotein and liver function tests have not been done and will be sent off today.  Past Medical History:  Diagnosis Date   Acute upper GI bleed    Aortic stenosis    a. 01/2016 echo: mod AS; b. 04/2018 Cardiac MRI: sev AS; c. 05/2018 s/p mech AVR @ time of myomectomy; d. 07/2018 Echo: Mild AS/AI.   Atherosclerosis of aorta ([place])    by CT scan in past   Atrial fibrillation (HCC)    and aflutter. pt has a left atrial circuit that is not ablated. was on amiodarone-stopped, now use rate control.    Bladder cancer ([place])    hx; treated with BCG in past    Bleeding duodenal ulcer    CAP (community acquired pneumonia) 06/13/2015   Carotid artery disease ([place])    There was calcified plaque but no stenosis by carotid artery screening done at  [facility] October, 2013   [facility]B (complete heart block) [facility])    a. 05/2018 s/p BSX VVIR PPM ([facility]) in setting of bradycardia following myomectomy and AVR/MVR.   Diabetes mellitus    type not specified   Diabetic neuropathy ([place])    feet   Elevated liver enzymes    over time; hx   Excessive sweating    Glaucoma    Gout    Head injury    when slipped on ice 2004-2005. stabilized and back on Coumadin    Headache    migraines - distant past   History of cardiac cath    a. 05/2018 Cath ([facility]): Non-obstructive CAD. Mildly elevated filling pressures w/ nl CI.   HOCM (hypertrophic obstructive cardiomyopathy) ([place])    a. 01/2016 Echo: EF 65-70%, no rwma, LVOT gradient of 80-63mHg, mod AS, SAM; b. 11/2017 Echo: EF 55-60%, near cavity obliteration in systole, mod AS, mild MS; c. 04/2018 Card MRI ([facility]): Sev LVH, EF >70%, Sev AS, LVOT obs w/ Sev MR, mild to mod TR/PR, mid-myocardial HE in basal-mid anteroseptum and inferoseptum; d. 05/2018 s/p Septal myomectomy; e. 07/2018 Echo: EF 60-65%. PASP 351mg.   Homocystinemia    elevated, mild    Hypercholesterolemia    treated.    Hypertension    Infection of right inner ear    Kidney mass  a. s/p laproscopic surgery woth cryoablation of a mass outside kidney followed at [facility]; b. 11/2018 CT Chest: 2cm R kidney mass.   Mediastinal adenopathy    a. 12/208 CT Chest: up to 69m LLL lung nodule. 1.5-1.7cm subcarinal/mediastinal adenopahty/right paratracheal lymph node; b. 11/2018 CT Chest: 2.5-3cm mediastinal adenopathy.   Mitral regurgitation    a. 04/2018 Cardiac MRI: sev MR; b. 05/2018 s/p mech MVR @ time of myomectomy; c. 07/2018 Echo: Mild MS.   Motion sickness    ocean boats   Nausea    Nonobstructive coronary artery disease    a. 05/2018 Cath ([facility]): nonobs CAD.   Obstructive sleep apnea    CPAP started successfully 2014   Orthostatic hypotension    a. orthostatic. dehydration. hospitalized 11/11   Sleep apnea    Significant  obstructive sleep apnea diagnosed in August, 2012, the patient is to receive CPAP    Stroke ([facility]    "2 old strokes" CT and MRI. [place] hospital 11/11. diagnosis was dehydration, no acutal reports.    TSH elevation    on synthroid historically    Unsteady gait    August, 2012    Past Surgical History:  Procedure Laterality Date   BLADDER SURGERY     CATARACT EXTRACTION W/PHACO Left 05/11/2015   Procedure: CATARACT EXTRACTION PHACO AND INTRAOCULAR LENS PLACEMENT ([place];  Surgeon: CLeandrew Koyanagi MD;  [place]  Service: Ophthalmology;  Laterality: Left;  DIABETIC - oral meds, CPAP   COLONOSCOPY WITH PROPOFOL N/A 04/14/2019   Procedure: COLONOSCOPY WITH PROPOFOL;  Surgeon: WLucilla Lame MD;  [facility];  Service: Endoscopy;  Laterality: N/A;   ESOPHAGOGASTRODUODENOSCOPY (EGD) WITH PROPOFOL N/A 07/01/2018   Procedure: ESOPHAGOGASTRODUODENOSCOPY (EGD) WITH PROPOFOL;  Surgeon: WLucilla Lame MD;  Location: [facility] ENDOSCOPY;  Service: Endoscopy;  Laterality: N/A;   INSERT / REPLACE / [place]    "froze mass"   MECHANICAL AORTIC AND MITRAL VALVE REPLACEMENT  05/2018   [facility]    TONSILLECTOMY     VALVE REPLACEMENT      Prior to Admission medications   Medication Sig Start Date End Date Taking? Authorizing Provider  alendronate (FOSAMAX) 70 MG tablet TAKE 1 TABLET BY MOUTH ONCE A WEEK WITH  A  FULL  GLASS  OF  WATER  ON  AN  EMPTY  STOMACH 06/08/22   GJerrol Banana, MD  amoxicillin (AMOXIL) 500 MG tablet Take 1 tablet (500 mg total) by mouth as needed (TAKE AS NEEDED FOR ANY DENTAL PROCEDURES). Take 4 tablets 30-60 minutes prior to any dental work Patient not taking: Reported on 08/02/2022 11/22/21   AWellington Hampshire MD  aspirin EC 81 MG tablet Take 1 tablet (81 mg total) by mouth daily. 09/04/18   KDeboraha Sprang MD  atorvastatin (LIPITOR) 40 MG tablet TAKE 1 TABLET BY MOUTH ONCE DAILY . APPOINTMENT  REQUIRED FOR FUTURE REFILLS 07/19/22   Simmons-Robinson, Makiera, MD  donepezil (ARICEPT) 5 MG tablet Take 1 tablet by mouth daily. 07/24/22   [provider]  latanoprost (XALATAN) 0.005 % ophthalmic solution Place 1 drop into both eyes at bedtime.  11/13/13   [provider]  levothyroxine (SYNTHROID) 50 MCG tablet Take 1 tablet by mouth once daily 07/19/22   Simmons-Robinson, Makiera, MD  metoprolol tartrate (LOPRESSOR) 25 MG tablet TAKE 1/2 (ONE-HALF) TABLET BY MOUTH TWICE DAILY. 12/11/21   AWellington Hampshire MD  pantoprazole (PROTONIX) 40 MG  tablet Take 1 tablet by mouth twice daily 06/20/22   [name], MD  warfarin (COUMADIN) 3 MG tablet TAKE 1 TO 1.5 TABLETS BY MOUTH ONCE DAILY AS DIRECTED 06/19/22   Wellington Hampshire, MD    Family History  Problem Relation Age of Onset   Arrhythmia Father        A-Fib   Prostate cancer Father    Stroke Father    Breast cancer Mother    Arrhythmia Brother        A-Fib   Heart attack Neg Hx    Hypertension Neg Hx      Social History   Tobacco Use   Smoking status: Former    Types: Cigars, Cigarettes    Quit date: 10/02/1974    Years since quitting: 47.9   Smokeless tobacco: Never   Tobacco comments:    still smokes cigars once a day  Vaping Use   Vaping Use: Never used  Substance Use Topics   Alcohol use: No   Drug use: No    Allergies as of 08/16/2022 - Review Complete 08/03/2022  Allergen Reaction Noted   Macrolides and ketolides Other (See Comments) 06/13/2015   Mycinettes  06/15/2011   Nitrofuran derivatives Other (See Comments) 05/21/2014   Nitrofurantoin Other (See Comments) 06/13/2015   Penicillins  11/15/2021   Erythromycin Itching and Rash 05/21/2014   Zofran [ondansetron hcl-dextrose] Rash 12/05/2018   Zofran [ondansetron hcl] Rash 12/01/2018    Review of Systems:    All systems reviewed and negative except where noted in HPI.   Physical Exam:  There were no vitals taken for this  visit. No LMP for male patient. General:   Alert,  Well-developed, well-nourished, pleasant and cooperative in NAD Head:  Normocephalic and atraumatic. Eyes:  Sclera clear, no icterus.   Conjunctiva pink. Ears:  Normal auditory acuity. Neck:  Supple; no masses or thyromegaly. Lungs:  Respirations even and unlabored.  Clear throughout to auscultation.   No wheezes, crackles, or rhonchi. No acute distress. Heart:  Regular rate and rhythm; no murmurs, clicks, rubs, or gallops. Abdomen:  Normal bowel sounds.  No bruits.  Soft, non-tender and non-distended without masses, hepatosplenomegaly or hernias noted.  No guarding or rebound tenderness.  Negative Carnett sign.   Rectal:  Deferred.  Pulses:  Normal pulses noted. Extremities:  No clubbing or edema.  No cyanosis. Neurologic:  Alert and oriented x3;  grossly normal neurologically. Skin:  Intact without significant lesions or rashes.  No jaundice. Lymph Nodes:  No significant cervical adenopathy. Psych:  Alert and cooperative. Normal mood and affect.  Imaging Studies: No results found.  Assessment and Plan:   Eloy M Oshields is a 88 y.o. y/o male who comes in today with a history of lesions in the lung that the patient has been told may be slow-growing cancer.  The patient also has been having cognitive issues and the wife states that he has been diagnosed with vascular dementia.  The patient will have his ultrasound reviewed when it is back and will also be sent off for liver function tests and alpha-fetoprotein today.  The patient has been explained the plan and agrees with it.  The patient will also be followed in 6 months with a repeat ultrasound and alpha-fetoprotein if he still doing well and healthy.  The patient and his wife have been explained the plan and agree with it.    [name], MD. Marval Regal    Note: This dictation was prepared  with Dragon dictation along with smaller phrase technology. Any transcriptional errors that result  from this process are unintentional.

## 2022-08-17 LAB — HEPATIC FUNCTION PANEL
ALT: 20 IU/L (ref 0–44)
AST: 34 IU/L (ref 0–40)
Albumin: 4.3 g/dL (ref 3.7–4.7)
Alkaline Phosphatase: 104 IU/L (ref 44–121)
Bilirubin Total: 0.7 mg/dL (ref 0.0–1.2)
Bilirubin, Direct: 0.2 mg/dL (ref 0.00–0.40)
Total Protein: 7.3 g/dL (ref 6.0–8.5)

## 2022-08-17 LAB — AFP TUMOR MARKER: AFP, Serum, Tumor Marker: 2.3 ng/mL (ref 0.0–6.4)

## 2022-08-21 ENCOUNTER — Encounter: Payer: Self-pay | Admitting: Family Medicine

## 2022-08-21 ENCOUNTER — Ambulatory Visit: Payer: PPO | Admitting: Physical Therapy

## 2022-08-21 DIAGNOSIS — Z789 Other specified health status: Secondary | ICD-10-CM

## 2022-08-23 ENCOUNTER — Ambulatory Visit: Payer: PPO | Admitting: Physical Therapy

## 2022-08-24 ENCOUNTER — Encounter: Payer: Self-pay | Admitting: Urology

## 2022-08-24 ENCOUNTER — Ambulatory Visit (INDEPENDENT_AMBULATORY_CARE_PROVIDER_SITE_OTHER): Payer: PPO | Admitting: Urology

## 2022-08-24 VITALS — BP 146/68 | HR 92 | Ht 69.0 in | Wt 162.0 lb

## 2022-08-24 DIAGNOSIS — Z8603 Personal history of neoplasm of uncertain behavior: Secondary | ICD-10-CM

## 2022-08-24 DIAGNOSIS — Z8551 Personal history of malignant neoplasm of bladder: Secondary | ICD-10-CM

## 2022-08-24 LAB — URINALYSIS, COMPLETE
Bilirubin, UA: NEGATIVE
Glucose, UA: NEGATIVE
Ketones, UA: NEGATIVE
Leukocytes,UA: NEGATIVE
Nitrite, UA: NEGATIVE
Specific Gravity, UA: 1.025 (ref 1.005–1.030)
Urobilinogen, Ur: 0.2 mg/dL (ref 0.2–1.0)
pH, UA: 5.5 (ref 5.0–7.5)

## 2022-08-24 LAB — MICROSCOPIC EXAMINATION

## 2022-08-24 NOTE — Progress Notes (Signed)
   08/24/22  CC:  Chief Complaint  Patient presents with   Cysto    HPI: Urothelial carcinoma in situ 2002 Induction BCG No recurrences Presents today for surveillance cystoscopy Since last years visit he has been diagnosed with vascular dementia His wife states he does have urinary frequency and nocturia  Blood pressure 139/64, pulse 93, height '5\' 9"'$  (1.753 m), weight 175 lb (79.4 kg).  Cystoscopy Procedure Note  Patient identification was confirmed, informed consent was obtained, and patient was prepped using Betadine solution.  Lidocaine jelly was administered per urethral meatus.     Pre-Procedure: - Inspection reveals a normal caliber urethral meatus.  Procedure: The flexible cystoscope was introduced without difficulty - No urethral strictures/lesions are present. -  Moderate lateral lobe enlargement  prostate  - Normal bladder neck - Bilateral ureteral orifices identified - Bladder mucosa  reveals no ulcers, tumors, or lesions - No bladder stones - Mild trabeculation  Retroflexion shows no abnormality   Post-Procedure: - Patient tolerated the procedure well  Assessment/ Plan: No mucosal abnormalities identified Continue annual surveillance Discussed medication for his voiding symptoms.  She states he is on multiple meds and does not desire to add additional medication at this time    Abbie Sons, MD

## 2022-08-28 ENCOUNTER — Ambulatory Visit: Payer: PPO | Admitting: Physical Therapy

## 2022-08-29 ENCOUNTER — Other Ambulatory Visit: Payer: Self-pay | Admitting: Cardiovascular Disease

## 2022-08-30 ENCOUNTER — Ambulatory Visit: Payer: PPO | Admitting: Physical Therapy

## 2022-08-30 NOTE — Telephone Encounter (Signed)
Refill request

## 2022-08-30 NOTE — Telephone Encounter (Signed)
Refill request for warfarin:  Last INR was 3.9 on 08/08/22 Next INR due 09/05/22 LOV was 05/15/22  Rod Can MD  Refill approved.

## 2022-09-03 ENCOUNTER — Telehealth: Payer: Self-pay

## 2022-09-03 DIAGNOSIS — M8008XA Age-related osteoporosis with current pathological fracture, vertebra(e), initial encounter for fracture: Secondary | ICD-10-CM

## 2022-09-03 MED ORDER — ALENDRONATE SODIUM 70 MG PO TABS: ORAL_TABLET | ORAL | 0 refills | Status: DC

## 2022-09-03 NOTE — Telephone Encounter (Signed)
[name]  P [facility] (supporting Virginia Crews, MD)13 hours ago (7:32 PM)    Hi Dr.B. This is [name] [name].  [facility] is out of his prescription for Alendronate  Sodium Tablets, USP 70 mg.   Will you please call this in to [facility] on [address]  Thanks, as always, and Happy Thanksgiving.

## 2022-09-04 ENCOUNTER — Ambulatory Visit: Payer: PPO | Admitting: Physical Therapy

## 2022-09-04 DIAGNOSIS — H409 Unspecified glaucoma: Secondary | ICD-10-CM | POA: Diagnosis not present

## 2022-09-04 DIAGNOSIS — C641 Malignant neoplasm of right kidney, except renal pelvis: Secondary | ICD-10-CM | POA: Diagnosis not present

## 2022-09-04 DIAGNOSIS — E039 Hypothyroidism, unspecified: Secondary | ICD-10-CM | POA: Diagnosis not present

## 2022-09-04 DIAGNOSIS — G3184 Mild cognitive impairment, so stated: Secondary | ICD-10-CM | POA: Diagnosis not present

## 2022-09-04 DIAGNOSIS — R531 Weakness: Secondary | ICD-10-CM | POA: Diagnosis not present

## 2022-09-04 DIAGNOSIS — Z79890 Hormone replacement therapy (postmenopausal): Secondary | ICD-10-CM | POA: Diagnosis not present

## 2022-09-05 ENCOUNTER — Ambulatory Visit: Payer: PPO | Attending: Cardiovascular Disease

## 2022-09-05 DIAGNOSIS — I4892 Unspecified atrial flutter: Secondary | ICD-10-CM | POA: Diagnosis not present

## 2022-09-05 DIAGNOSIS — I482 Chronic atrial fibrillation, unspecified: Secondary | ICD-10-CM

## 2022-09-05 DIAGNOSIS — I4891 Unspecified atrial fibrillation: Secondary | ICD-10-CM

## 2022-09-05 DIAGNOSIS — Z5181 Encounter for therapeutic drug level monitoring: Secondary | ICD-10-CM

## 2022-09-05 LAB — POCT INR: INR: 4.2 — AB (ref 2.0–3.0)

## 2022-09-05 NOTE — Patient Instructions (Signed)
HOLD TODAY ONLY then Continue 1.5 tablets every day EXCEPT 1 tablet on MONDAY, WEDNESDAY and FRIDAY.   Recheck INR in 2 weeks.

## 2022-09-11 ENCOUNTER — Ambulatory Visit: Payer: Self-pay | Admitting: Physical Therapy

## 2022-09-11 ENCOUNTER — Other Ambulatory Visit: Payer: Self-pay | Admitting: Cardiovascular Disease

## 2022-09-11 ENCOUNTER — Ambulatory Visit: Payer: PPO | Admitting: Physical Therapy

## 2022-09-11 DIAGNOSIS — R262 Difficulty in walking, not elsewhere classified: Secondary | ICD-10-CM

## 2022-09-11 NOTE — Therapy (Incomplete)
OUTPATIENT PHYSICAL THERAPY treatment note / Recert ***     Patient Name: [name] [name] MRN: [id number] DOB:1934/09/18, 88 y.o., male Today's Date: 09/11/2022  PCP: [name], MD REFERRING PROVIDER: [name], MD      There were no vitals filed for this visit.               Past Medical History:  Diagnosis Date   Acute upper GI bleed    Aortic stenosis    a. 01/2016 echo: mod AS; b. 04/2018 Cardiac MRI: sev AS; c. 05/2018 s/p mech AVR @ time of myomectomy; d. 07/2018 Echo: Mild AS/AI.   Atherosclerosis of aorta ([place])    by CT scan in past   Atrial fibrillation (HCC)    and aflutter. pt has a left atrial circuit that is not ablated. was on amiodarone-stopped, now use rate control.    Bladder cancer ([place])    hx; treated with BCG in past    Bleeding duodenal ulcer    CAP (community acquired pneumonia) 06/13/2015   Carotid artery disease ([place])    There was calcified plaque but no stenosis by carotid artery screening done at [facility] October, 2013   CHB (complete heart block) [facility])    a. 05/2018 s/p BSX VVIR PPM ([facility]) in setting of bradycardia following myomectomy and AVR/MVR.   Diabetes mellitus    type not specified   Diabetic neuropathy ([place])    feet   Elevated liver enzymes    over time; hx   Excessive sweating    Glaucoma    Gout    Head injury    when slipped on ice 2004-2005. stabilized and back on Coumadin    Headache    migraines - distant past   History of cardiac cath    a. 05/2018 Cath ([facility]): Non-obstructive CAD. Mildly elevated filling pressures w/ nl CI.   HOCM (hypertrophic obstructive cardiomyopathy) ([place])    a. 01/2016 Echo: EF 65-70%, no rwma, LVOT gradient of 80-59mHg, mod AS, SAM; b. 11/2017 Echo: EF 55-60%, near cavity obliteration in systole, mod AS, mild MS; c. 04/2018 Card MRI ([facility]): Sev LVH, EF >70%, Sev AS, LVOT obs w/ Sev MR, mild to mod TR/PR, mid-myocardial HE in basal-mid anteroseptum and  inferoseptum; d. 05/2018 s/p Septal myomectomy; e. 07/2018 Echo: EF 60-65%. PASP 315mg.   Homocystinemia    elevated, mild    Hypercholesterolemia    treated.    Hypertension    Infection of right inner ear    Kidney mass    a. s/p laproscopic surgery woth cryoablation of a mass outside [facility]: 2cm R kidney mass.   Mediastinal adenopathy    a. 12/208 CT Chest: up to 1m54mLL lung nodule. 1.5-1.7cm subcarinal/mediastinal adenopahty/right paratracheal lymph node; b. 11/2018 CT Chest: 2.5-3cm mediastinal adenopathy.   Mitral regurgitation    a. 04/2018 Cardiac MRI: sev MR; b. 05/2018 s/p mech MVR @ time of myomectomy; c. 07/2018 Echo: Mild MS.   Motion sickness    ocean boats   Nausea    Nonobstructive coronary artery disease    a. 05/2018 Cath ([facility]): nonobs CAD.   Obstructive sleep apnea    CPAP started successfully 2014   Orthostatic hypotension    a. orthostatic. dehydration. hospitalized 11/11   Sleep apnea    Significant obstructive sleep apnea diagnosed in August, 2012, the patient is to receive CPAP    [facility]  [facility]" CT and MRI. [place] [facility] 11/11. diagnosis was dehydration, no acutal reports.    TSH elevation    on synthroid historically    Unsteady gait    August, 2012   Past Surgical History:  Procedure Laterality Date   BLADDER SURGERY     CATARACT EXTRACTION W/PHACO Left 05/11/2015   Procedure: CATARACT EXTRACTION PHACO AND INTRAOCULAR LENS PLACEMENT ([place]);  Surgeon: Leandrew Koyanagi, MD;  Location: [place];  Service: Ophthalmology;  Laterality: Left;  DIABETIC - oral meds, CPAP   COLONOSCOPY WITH PROPOFOL N/A 04/14/2019   Procedure: COLONOSCOPY WITH PROPOFOL;  Surgeon: [name], MD;  Location: [facility] ENDOSCOPY;  Service: Endoscopy;  Laterality: N/A;   ESOPHAGOGASTRODUODENOSCOPY (EGD) WITH PROPOFOL N/A 07/01/2018   Procedure: ESOPHAGOGASTRODUODENOSCOPY (EGD) WITH PROPOFOL;  Surgeon: [name], MD;   Location: [facility] ENDOSCOPY;  Service: Endoscopy;  Laterality: N/A;   INSERT / REPLACE / [place]     "froze mass"   MECHANICAL AORTIC AND MITRAL VALVE REPLACEMENT  05/2018   [facility]    TONSILLECTOMY     VALVE REPLACEMENT     Patient Active Problem List   Diagnosis Date Noted   Cirrhosis of liver without ascites, unspecified hepatic cirrhosis type ([place]) 08/02/2022   Moderate dementia without behavioral disturbance, psychotic disturbance, mood disturbance, or anxiety ([place]) 08/02/2022   Other abnormalities of gait and mobility 11/15/2021   Depression 11/15/2021   Abnormal CT scan    Abnormal findings on radiological examination of gastrointestinal tract    Polyp of colon    Cancer of right kidney ([place]) 12/10/2018   Pulmonary nodules 12/01/2018   Mediastinal lymphadenopathy 12/01/2018   Heart block AV third degree ([place]) 10/07/2018   Pacemaker 10/07/2018   H/O aortic valve replacement 10/07/2018   Blood in stool    Decreased activities of daily living (ADL) 06/09/2018   Decreased strength, endurance, and mobility 06/09/2018   Pleural effusion, right 06/09/2018   S/P AVR (aortic valve replacement) 06/08/2018   H/O ventricular septal myectomy 06/08/2018   Chronic anticoagulation 06/02/2018   Heart murmur, systolic 34/74/2595   H/O: rheumatic fever 03/21/2017   Tinnitus 10/30/2016   On warfarin therapy 07/06/2015   Arteriosclerosis of coronary artery 06/13/2015   Carcinoma in situ of bladder 06/13/2015   Diabetes mellitus, type 2 ([place]) 06/13/2015   Diabetic neuropathy ([place]) 06/13/2015   Dizziness 06/13/2015   Hypertension associated with diabetes ([place]) 06/13/2015   Fatigue 06/13/2015   Glaucoma 06/13/2015   Hypercholesteremia 06/13/2015   Male hypogonadism 06/13/2015   Adult hypothyroidism 06/13/2015   Arthritis, degenerative 06/13/2015   Adenocarcinoma, renal cell ([place]) 06/13/2015   Benign head tremor 12/13/2014   Carotid arterial  disease ([place]) 07/12/2014   Decreased cardiac output 07/12/2014   Cardiomyopathy, hypertrophic obstructive ([place]) 07/12/2014   Head injuries 07/12/2014   HOCM (hypertrophic obstructive cardiomyopathy) ([place])    Encounter for therapeutic drug monitoring 11/16/2013   Obstructive sleep apnea    Benign prostatic hypertrophy without urinary obstruction 06/09/2012   History of neoplasm of bladder 06/09/2012   Head injury    Atherosclerosis of aorta (HCC)    History of stroke    Unsteady gait    Excessive sweating    MITRAL REGURGITATION 03/05/2009   Chronic atrial fibrillation ([place]) 03/05/2009   Atrial fibrillation and flutter ([place]) 03/05/2009   Mitral and aortic incompetence 03/05/2009      REFERRING DIAG: Imbalance  THERAPY DIAG:  No diagnosis found.  Rationale for Evaluation and Treatment Rehabilitation  PERTINENT HISTORY:  Pt report she has been having balance issues secondary to his ears. Pt was in the artilerary. Pt sold a home in april and has fallen 5 times since then. Pt tripped over a piece of wood in back yard and fell on one occassion. Pt has history of cardiac rehab [facility] at [facility]. Pt has a very difficult time with balance without his cane. He has difficulty with balance when he turns his head.   PRECAUTIONS: Fall  SUBJECTIVE: Pt/wife asking about making today the last visit, as they plan to head to [place] for the month. They are on top of HEP activity, hope to return after thanksgiving.   Presents to therapy with his wife; Tiffany Kocher  PAIN:  Are you having pain? No     TODAY'S TREATMENT: 09/11/22 -overground AMB 3lb AW bilat, 414f 493m 43sec  -seated recovery  -STS from chair, hands on knees x10 (minA for last 3)  -overground AMB 5lb AW bilat, [id number]  -seated recovery -STS from chair, hands on knees x8 3 -soft surface land course: over 25ft9fd mat, falls mat, yoga mat with 2 spaced out airex pads, 180 degree turn and perform in reverse. 3 laps  consecutively.     Pt educated throughout session about proper posture and technique with exercises. Improved exercise technique, movement at target joints, use of target muscles after min to mod verbal, visual, tactile cues.  Note: Portions of this document were prepared using Dragon voice recognition software and although reviewed may contain unintentional dictation errors in syntax, grammar, or spelling.             PATIENT EDUCATION: Education details: Education regarding when to use of EMS services and education regarding what the services entail the patient is to have a fall. Person educated: Patient Education method: Explanation, Demonstration, Tactile cues, and Verbal cues Education comprehension: verbalized understanding, returned demonstration, and verbal cues required   HOME EXERCISE PROGRAM:  Access Code: [email or web address] Date: 04/26/2022 Prepared by: Josh Little [place] Nationrcises - Standing March with Counter Support  - 1 x daily - 7 x weekly - 3 sets - 10 reps - Standing Knee Flexion with Unilateral Counter Support  - 1 x daily - 7 x weekly - 3 sets - 10 reps - Heel Toe Raises with Unilateral Counter Support  - 1 x daily - 7 x weekly - 3 sets - 10 reps - Standing Hip Abduction with Unilateral Counter Support  - 1 x daily - 7 x weekly - 3 sets - 10 reps - Standing Hip Extension with Unilateral Counter Support  - 1 x daily - 7 x weekly - 3 sets - 10 reps   PT Short Term Goals        PT SHORT TERM GOAL #1   Title Patient will be independent in home exercise program to improve strength/mobility for better functional independence with ADLs.    Baseline no HEP 8/15: Not completing regularly, lots of discussion about importance of comliance    Time 4    Period Weeks    Status Progressing    Target Date 04/16/22              PT Long Term Goals       PT LONG TERM GOAL #1   Title Patient will increase FOTO score to equal to  or greater than   48  to demonstrate statistically significant improvement in mobility  and quality of life.    Baseline 42 9/19:54   Time 8    Period Weeks    Status Met    Target Date 06/11/22      PT LONG TERM GOAL #2   Title Patient  will complete five times sit to stand test in < 17 seconds indicating an increased LE strength and improved balance.    Baseline 23.53 sec 8/15: 17 sec heavy UE use  9/19: 15.8 sec   Time 12    Period Weeks    Status Met          PT LONG TERM GOAL #3   Title Patient will increase Berg Balance score by > 6 points to demonstrate decreased fall risk during functional activities.    Baseline 34,  8/15:38 9/19: test next session 9/21:43   Time 12    Period Weeks    Status Partially met       PT LONG TERM GOAL #4   Title Patient will increase six minute walk test distance to >1000 for progression to community ambulator and improve gait ability    Baseline 725 feet 9/19: 745 feet without AD    Time 12    Period Weeks    Status Partially met     Target Date 09/04/22      PT LONG TERM GOAL #5   Title Patient will increase 10 meter walk test to >1.7ms as to improve gait speed for better community ambulation and to reduce fall risk.    Baseline .58 m/s 9/19: .65 m/s   Time 12    Period Weeks    Status Ongoing     Target Date 09/04/22                    Plan     Clinical Impression Statement Patient continues to demonstrate great motivation for physical therapy activities. Finished up last session today prior to hiatus to go to beach for a month. Pt continues to show progression in mobility tolerance.  Patient will continue to benefit with physical therapy to improve his mobility, balance, prevent falls, and improve safety at home in the community.    Personal Factors and Comorbidities Age;Comorbidity 1;Comorbidity 2;Comorbidity 3+    Comorbidities CAD, Peripheral neuropathy, T2DM, HTN,    Examination-Activity Limitations Caring for  Others;Carry;Locomotion Level;Stairs;Stand    Examination-Participation Restrictions Yard Work;Shop;Community Activity    Stability/Clinical Decision Making Evolving/Moderate complexity    Rehab Potential Fair    PT Frequency 2x / week    PT Duration 12 weeks    PT Treatment/Interventions ADLs/Self Care Home Management;Moist Heat;DME Instruction;Stair training;Functional mobility training;Therapeutic activities;Therapeutic exercise;Balance training;Neuromuscular re-education;Patient/family education;Wheelchair mobility training;Manual techniques;Dry needling;Energy conservation    PT Next Visit Plan test 6MWT and 10MWT, also establish HEP for 2 weeks [place] at [place] 2    Consulted and Agree with Plan of Care Patient;Family member/caregiver              9:27 AM, 09/11/22 AEtta Grandchild PT, DPT Physical Therapist - [facility] [email or web address] Keldrick Pomplun PT, DPT  09/11/22 9:27 AM   09/11/22, 9:27 AM

## 2022-09-11 NOTE — Therapy (Signed)
Patient and caregiver presented to physical therapy session.  Patient caregiver reports her and her family have been going through a lot lately with recent diagnosis of vascular dementia of involved patient as well as her son's recent surgical procedures.  She reports these are limiting her ability to continue with therapy at this time and feels discharge is appropriate now.  Physical therapist is in agreements and verbalizes importance of continuing with home exercise program as patient was nearing discharge last time patient was seen by this therapist.  Patient also educated regarding start of memory therapy with speech and language pathologist at this t due to recent onset of vascular dementia as was discussed in previous PT sessions. Pt is instructed that order has gone through and she will be called later this afternoon for scheduling of this. Patient will be discharged from physical therapy at this time.  Rivka Barbara PT, DPT

## 2022-09-13 ENCOUNTER — Ambulatory Visit: Payer: PPO | Admitting: Physical Therapy

## 2022-09-13 DIAGNOSIS — L821 Other seborrheic keratosis: Secondary | ICD-10-CM | POA: Diagnosis not present

## 2022-09-13 DIAGNOSIS — Z86018 Personal history of other benign neoplasm: Secondary | ICD-10-CM | POA: Diagnosis not present

## 2022-09-13 DIAGNOSIS — L578 Other skin changes due to chronic exposure to nonionizing radiation: Secondary | ICD-10-CM | POA: Diagnosis not present

## 2022-09-14 ENCOUNTER — Telehealth: Payer: Self-pay

## 2022-09-14 NOTE — Telephone Encounter (Signed)
Returned call to Pt's wife.  She would like to know if a life alert necklace would interfere with pacemaker function.    Discussed with BS rep.  Life alert will not interfere with PPM function.  Pt's wife advised.

## 2022-09-14 NOTE — Telephone Encounter (Signed)
Patient wife called in stating that patient has gotten a Medquip alert and wanted to know if that will interfere with his ppm. Patient wife will like a call back

## 2022-09-18 ENCOUNTER — Ambulatory Visit: Payer: PPO | Admitting: Physical Therapy

## 2022-09-19 ENCOUNTER — Ambulatory Visit: Payer: PPO | Attending: Cardiovascular Disease

## 2022-09-19 DIAGNOSIS — I4891 Unspecified atrial fibrillation: Secondary | ICD-10-CM

## 2022-09-19 DIAGNOSIS — I4892 Unspecified atrial flutter: Secondary | ICD-10-CM | POA: Diagnosis not present

## 2022-09-19 DIAGNOSIS — Z5181 Encounter for therapeutic drug level monitoring: Secondary | ICD-10-CM

## 2022-09-19 DIAGNOSIS — I482 Chronic atrial fibrillation, unspecified: Secondary | ICD-10-CM

## 2022-09-19 LAB — POCT INR: INR: 4.7 — AB (ref 2.0–3.0)

## 2022-09-19 NOTE — Patient Instructions (Signed)
HOLD TODAY AND TOMORROW ONLY then Continue 1.5 tablets every day EXCEPT 1 tablet on MONDAY, WEDNESDAY and FRIDAY.   Recheck INR in 3 weeks. Days that he has diarrhea, eat greens

## 2022-09-20 ENCOUNTER — Ambulatory Visit: Payer: PPO | Admitting: Physical Therapy

## 2022-09-24 DIAGNOSIS — H353132 Nonexudative age-related macular degeneration, bilateral, intermediate dry stage: Secondary | ICD-10-CM | POA: Diagnosis not present

## 2022-09-25 ENCOUNTER — Ambulatory Visit: Payer: PPO | Admitting: Physical Therapy

## 2022-09-27 ENCOUNTER — Ambulatory Visit: Payer: PPO | Admitting: Physical Therapy

## 2022-09-27 DIAGNOSIS — E119 Type 2 diabetes mellitus without complications: Secondary | ICD-10-CM | POA: Diagnosis not present

## 2022-09-27 DIAGNOSIS — B351 Tinea unguium: Secondary | ICD-10-CM | POA: Diagnosis not present

## 2022-10-02 ENCOUNTER — Ambulatory Visit: Payer: PPO | Admitting: Physical Therapy

## 2022-10-04 ENCOUNTER — Ambulatory Visit: Payer: PPO | Admitting: Physical Therapy

## 2022-10-04 DIAGNOSIS — I4821 Permanent atrial fibrillation: Secondary | ICD-10-CM | POA: Diagnosis not present

## 2022-10-04 DIAGNOSIS — I824Y9 Acute embolism and thrombosis of unspecified deep veins of unspecified proximal lower extremity: Secondary | ICD-10-CM | POA: Insufficient documentation

## 2022-10-04 DIAGNOSIS — I442 Atrioventricular block, complete: Secondary | ICD-10-CM | POA: Diagnosis not present

## 2022-10-04 DIAGNOSIS — I4891 Unspecified atrial fibrillation: Secondary | ICD-10-CM | POA: Diagnosis not present

## 2022-10-04 DIAGNOSIS — Z7901 Long term (current) use of anticoagulants: Secondary | ICD-10-CM | POA: Diagnosis not present

## 2022-10-04 DIAGNOSIS — Z950 Presence of cardiac pacemaker: Secondary | ICD-10-CM | POA: Diagnosis not present

## 2022-10-09 ENCOUNTER — Ambulatory Visit: Payer: PPO | Admitting: Physical Therapy

## 2022-10-10 ENCOUNTER — Ambulatory Visit: Payer: PPO | Attending: Cardiovascular Disease

## 2022-10-10 ENCOUNTER — Telehealth: Payer: Self-pay | Admitting: Family Medicine

## 2022-10-10 DIAGNOSIS — Z5181 Encounter for therapeutic drug level monitoring: Secondary | ICD-10-CM

## 2022-10-10 DIAGNOSIS — I482 Chronic atrial fibrillation, unspecified: Secondary | ICD-10-CM

## 2022-10-10 DIAGNOSIS — I4891 Unspecified atrial fibrillation: Secondary | ICD-10-CM | POA: Diagnosis not present

## 2022-10-10 DIAGNOSIS — I4892 Unspecified atrial flutter: Secondary | ICD-10-CM | POA: Diagnosis not present

## 2022-10-10 LAB — POCT INR: INR: 4.9 — AB (ref 2.0–3.0)

## 2022-10-10 NOTE — Telephone Encounter (Signed)
Left message for patient to call back and schedule Medicare Annual Wellness Visit (AWV) in office.   If not able to come in office, please offer to do virtually or by telephone.  Left office number and my jabber [phone].  Last AWV:07/13/2021   Please schedule at anytime with Nurse Health Advisor.

## 2022-10-10 NOTE — Patient Instructions (Signed)
HOLD TODAY and tomorrow then Continue 1.5 tablets every day EXCEPT 1 tablet on MONDAY, WEDNESDAY and FRIDAY.  Recheck INR in 3 weeks. Days that he has diarrhea, eat greens

## 2022-10-11 ENCOUNTER — Ambulatory Visit: Payer: PPO | Admitting: Physical Therapy

## 2022-10-11 ENCOUNTER — Telehealth: Payer: Self-pay | Admitting: *Deleted

## 2022-10-11 NOTE — Patient Outreach (Signed)
  Care Coordination   10/11/2022 Name: [name] [name] MRN: [id number] DOB: 1934-03-25   Care Coordination Outreach Attempts:  An unsuccessful telephone outreach was attempted today to offer the patient information about available care coordination services as a benefit of their health plan.   Follow Up Plan:  Additional outreach attempts will be made to offer the patient care coordination information and services.   Encounter Outcome:  No Answer   Care Coordination Interventions:  No, not indicated    Valente David, RN, MSN, [facility] Care Management Care Management Coordinator [phone]

## 2022-10-12 ENCOUNTER — Telehealth: Payer: Self-pay

## 2022-10-12 DIAGNOSIS — Z8709 Personal history of other diseases of the respiratory system: Secondary | ICD-10-CM | POA: Diagnosis not present

## 2022-10-12 DIAGNOSIS — Z09 Encounter for follow-up examination after completed treatment for conditions other than malignant neoplasm: Secondary | ICD-10-CM | POA: Diagnosis not present

## 2022-10-12 NOTE — Telephone Encounter (Signed)
Copied from [place] [phone]. Topic: Appointment Scheduling - Scheduling Inquiry for Clinic >> Oct 11, 2022  4:56 PM [place] B wrote: Reason for CRM: Patient's wife called in to speak with Morey Hummingbird about scheduling AWV for paitent

## 2022-10-16 ENCOUNTER — Ambulatory Visit: Payer: PPO | Admitting: Physical Therapy

## 2022-10-17 ENCOUNTER — Telehealth: Payer: Self-pay | Admitting: *Deleted

## 2022-10-17 NOTE — Telephone Encounter (Signed)
Spouse is returning call please call back at [phone]

## 2022-10-17 NOTE — Patient Outreach (Signed)
  Care Coordination   10/17/2022 Name: [name] [name] MRN: [id number] DOB: [birth year]   Care Coordination Outreach Attempts:  A second unsuccessful outreach was attempted today to offer the patient with information about available care coordination services as a benefit of their health plan.     Wife returned call through the PEC/provider's office. No answer when call returned.  Will attempt another call on 1/8.  Follow Up Plan:  Additional outreach attempts will be made to offer the patient care coordination information and services.   Encounter Outcome:  No Answer   Care Coordination Interventions:  No, not indicated    Valente David, RN, MSN, [facility] Care Management Care Management Coordinator [phone]

## 2022-10-17 NOTE — Patient Outreach (Signed)
  Care Coordination   10/17/2022 Name: [name] [name] MRN: [id number] DOB: 1933/12/29   Care Coordination Outreach Attempts:  A second unsuccessful outreach was attempted today to offer the patient with information about available care coordination services as a benefit of their health plan.     Follow Up Plan:  Additional outreach attempts will be made to offer the patient care coordination information and services.   Encounter Outcome:  No Answer   Care Coordination Interventions:  No, not indicated    Valente David, RN, MSN, [facility] Care Management Care Management Coordinator [phone]

## 2022-10-18 ENCOUNTER — Ambulatory Visit (INDEPENDENT_AMBULATORY_CARE_PROVIDER_SITE_OTHER): Payer: PPO

## 2022-10-18 ENCOUNTER — Ambulatory Visit: Payer: PPO | Admitting: Physical Therapy

## 2022-10-18 VITALS — Ht 69.0 in | Wt 163.0 lb

## 2022-10-18 DIAGNOSIS — H903 Sensorineural hearing loss, bilateral: Secondary | ICD-10-CM | POA: Insufficient documentation

## 2022-10-18 DIAGNOSIS — Z461 Encounter for fitting and adjustment of hearing aid: Secondary | ICD-10-CM | POA: Insufficient documentation

## 2022-10-18 DIAGNOSIS — R269 Unspecified abnormalities of gait and mobility: Secondary | ICD-10-CM | POA: Insufficient documentation

## 2022-10-18 DIAGNOSIS — F419 Anxiety disorder, unspecified: Secondary | ICD-10-CM | POA: Insufficient documentation

## 2022-10-18 DIAGNOSIS — E119 Type 2 diabetes mellitus without complications: Secondary | ICD-10-CM | POA: Insufficient documentation

## 2022-10-18 DIAGNOSIS — Z741 Need for assistance with personal care: Secondary | ICD-10-CM | POA: Insufficient documentation

## 2022-10-18 DIAGNOSIS — Z0000 Encounter for general adult medical examination without abnormal findings: Secondary | ICD-10-CM | POA: Diagnosis not present

## 2022-10-18 DIAGNOSIS — Z029 Encounter for administrative examinations, unspecified: Secondary | ICD-10-CM | POA: Insufficient documentation

## 2022-10-18 DIAGNOSIS — F02A 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, mild, without behavioral disturbance, psychotic disturbance, mood disturbance, and anxiety: Secondary | ICD-10-CM | POA: Insufficient documentation

## 2022-10-18 DIAGNOSIS — F0390 Unspecified dementia without behavioral disturbance: Secondary | ICD-10-CM | POA: Insufficient documentation

## 2022-10-18 DIAGNOSIS — Z7189 Other specified counseling: Secondary | ICD-10-CM | POA: Insufficient documentation

## 2022-10-18 NOTE — Progress Notes (Signed)
Virtual Visit via Telephone Note  I connected with  [name] [name] on 10/18/22 at 11:00 AM EST by telephone and verified that I am speaking with the correct person using two identifiers.  Location: Patient: home Provider: [facility] Persons participating in the virtual visit: [place]   I discussed the limitations, risks, security and privacy concerns of performing an evaluation and management service by telephone and the availability of in person appointments. The patient expressed understanding and agreed to proceed.  Interactive audio and video telecommunications were attempted between this nurse and patient, however failed, due to patient having technical difficulties OR patient did not have access to video capability.  We continued and completed visit with audio only.  Some vital signs may be absent or patient reported.   Dionisio David, LPN  Subjective:   [name] [name] is a 89 y.o. male who presents for Medicare Annual/Subsequent preventive examination.  Review of Systems     Cardiac Risk Factors include: advanced age (>27mn, >>31women);dyslipidemia;male gender;hypertension     Objective:    There were no vitals filed for this visit. There is no height or weight on file to calculate BMI.     10/18/2022   11:09 AM 12/15/2021    1:32 PM 07/13/2021    5:39 PM 04/24/2021   12:23 PM 01/20/2020   10:47 AM 06/10/2019    1:42 PM 01/19/2019    9:37 AM  Advanced Directives  Does Patient Have a Medical Advance Directive? Yes Yes Yes No Yes No Yes  Type of AParamedicof [place]nes[place]Living will HC[place]Living will H[place]anchvilleLiving will  H[place]Living will  [place]Living will  Does patient want to make changes to medical advance directive? No - Patient declined  No - Patient declined      Copy of HP[place] Cityin Chart? Yes - validated most recent  copy scanned in chart (See row information)  Yes - validated most recent copy scanned in chart (See row information)  Yes - validated most recent copy scanned in chart (See row information)  Yes - validated most recent copy scanned in chart (See row information)    Current Medications (verified) Outpatient Encounter Medications as of 10/18/2022  Medication Sig   alendronate (FOSAMAX) 70 MG tablet TAKE 1 TABLET BY MOUTH ONCE A WEEK WITH  A  FULL  GLASS  OF  WATER  ON  AN  EMPTY  STOMACH   aspirin EC 81 MG tablet Take 1 tablet (81 mg total) by mouth daily.   atorvastatin (LIPITOR) 40 MG tablet TAKE 1 TABLET BY MOUTH ONCE DAILY . APPOINTMENT REQUIRED FOR FUTURE REFILLS   donepezil (ARICEPT) 5 MG tablet Take 1 tablet by mouth daily.   latanoprost (XALATAN) 0.005 % ophthalmic solution Place 1 drop into both eyes at bedtime.    levothyroxine (SYNTHROID) 50 MCG tablet Take 1 tablet by mouth once daily   metoprolol tartrate (LOPRESSOR) 25 MG tablet Take 1/2 (one-half) tablet by mouth twice daily   pantoprazole (PROTONIX) 40 MG tablet Take 1 tablet by mouth twice daily   warfarin (COUMADIN) 3 MG tablet TAKE 1 TO 1 & 1/2 (ONE TO ONE & ONE-HALF) TABLETS BY MOUTH ONCE DAILY AS DIRECTED   No facility-administered encounter medications on file as of 10/18/2022.    Allergies (verified) Macrolides and ketolides, Mycinettes, Nitrofuran derivatives, Nitrofurantoin, Penicillins, Erythromycin, Zofran [ondansetron hcl-dextrose], and Zofran [ondansetron hcl]   History:  Past Medical History:  Diagnosis Date   Acute upper GI bleed    Aortic stenosis    a. 01/2016 echo: mod AS; b. 04/2018 Cardiac MRI: sev AS; c. 05/2018 s/p mech AVR @ time of myomectomy; d. 07/2018 Echo: Mild AS/AI.   Atherosclerosis of aorta ([place])    by CT scan in past   Atrial fibrillation (HCC)    and aflutter. pt has a left atrial circuit that is not ablated. was on amiodarone-stopped, now use rate control.    Bladder cancer ([place])    hx;  treated with BCG in past    Bleeding duodenal ulcer    CAP (community acquired pneumonia) 06/13/2015   Carotid artery disease ([place])    There was calcified plaque but no stenosis by carotid artery screening done at [facility] October, 2013   CHB (complete heart block) [facility])    a. 05/2018 s/p BSX VVIR PPM ([facility]) in setting of bradycardia following myomectomy and AVR/MVR.   Diabetes mellitus    type not specified   Diabetic neuropathy ([place])    feet   Elevated liver enzymes    over time; hx   Excessive sweating    Glaucoma    Gout    Head injury    when slipped on ice 2004-2005. stabilized and back on Coumadin    Headache    migraines - distant past   History of cardiac cath    a. 05/2018 Cath ([facility]): Non-obstructive CAD. Mildly elevated filling pressures w/ nl CI.   HOCM (hypertrophic obstructive cardiomyopathy) ([place])    a. 01/2016 Echo: EF 65-70%, no rwma, LVOT gradient of 80-629mHg, mod AS, SAM; b. 11/2017 Echo: EF 55-60%, near cavity obliteration in systole, mod AS, mild MS; c. 04/2018 Card MRI ([facility]): Sev LVH, EF >70%, Sev AS, LVOT obs w/ Sev MR, mild to mod TR/PR, mid-myocardial HE in basal-mid anteroseptum and inferoseptum; d. 05/2018 s/p Septal myomectomy; e. 07/2018 Echo: EF 60-65%. PASP 354mg.   Homocystinemia    elevated, mild    Hypercholesterolemia    treated.    Hypertension    Infection of right inner ear    Kidney mass    a. s/p laproscopic surgery woth cryoablation of a mass outside [facility]: 2cm R kidney mass.   Mediastinal adenopathy    a. 12/208 CT Chest: up to 29m329mLL lung nodule. 1.5-1.7cm subcarinal/mediastinal adenopahty/right paratracheal lymph node; b. 11/2018 CT Chest: 2.5-3cm mediastinal adenopathy.   Mitral regurgitation    a. 04/2018 Cardiac MRI: sev MR; b. 05/2018 s/p mech MVR @ time of myomectomy; c. 07/2018 Echo: Mild MS.   Motion sickness    ocean boats   Nausea    Nonobstructive coronary artery disease     a. 05/2018 Cath ([facility]): nonobs CAD.   Obstructive sleep apnea    CPAP started successfully 2014   Orthostatic hypotension    a. orthostatic. dehydration. hospitalized 11/11   Sleep apnea    Significant obstructive sleep apnea diagnosed in August, 2012, the patient is to receive CPAP    [facility] and [facility]. [place] [facility] 11/11. diagnosis was dehydration, no acutal reports.    TSH elevation    on synthroid historically    Unsteady gait    August, 2012   Past Surgical History:  Procedure Laterality Date   BLADDER SURGERY     CATARACT EXTRACTION W/PHACO Left 05/11/2015   Procedure: CATARACT EXTRACTION PHACO AND  INTRAOCULAR LENS PLACEMENT (IOC);  Surgeon: Leandrew Koyanagi, MD;  Location: [facility];  Service: Ophthalmology;  Laterality: Left;  DIABETIC - oral meds, CPAP   COLONOSCOPY WITH PROPOFOL N/A 04/14/2019   Procedure: COLONOSCOPY WITH PROPOFOL;  Surgeon: [name], MD;  Location[facility] ENDOSCOPY;  Service: Endoscopy;  Laterality: N/A;   ESOPHAGOGASTRODUODENOSCOPY (EGD) WITH PROPOFOL N/A 07/01/2018   Procedure: ESOPHAGOGASTRODUODENOSCOPY (EGD) WITH PROPOFOL;  Surgeon: [name], MD;  Location: [facility] ENDOSCOPY;  Service: Endoscopy;  Laterality: N/A;   INSERT / REPLACE / [place]     "froze mass"   MECHANICAL AORTIC AND MITRAL VALVE REPLACEMENT  05/2018   [facility]    TONSILLECTOMY     VALVE REPLACEMENT     Family History  Problem Relation Age of Onset   Arrhythmia Father        A-Fib   Prostate cancer Father    Stroke Father    Breast cancer Mother    Arrhythmia Brother        A-Fib   Heart attack Neg Hx    Hypertension Neg Hx    Social History   Socioeconomic History   Marital status: Married    Spouse name: Jasmine Awe   Number of children: 1   Years of education: 16   Highest education level: Bachelor's degree (e.g., BA, AB, BS)  Occupational History   Occupation: retired  Tobacco  Use   Smoking status: Former    Types: Cigars, Cigarettes    Quit date: 10/02/1974    Years since quitting: 48.0   Smokeless tobacco: Never   Tobacco comments:    still smokes cigars once a day  Vaping Use   Vaping Use: Never used  Substance and Sexual Activity   Alcohol use: No   Drug use: No   Sexual activity: Yes  Other Topics Concern   Not on file  Social History Narrative   Married, retired, gets regular exercise.    Social Determinants of Health   Financial Resource Strain: Low Risk  (10/18/2022)   Overall Financial Resource Strain (CARDIA)    Difficulty of Paying Living Expenses: Not hard at all  Food Insecurity: No Food Insecurity (10/18/2022)   Hunger Vital Sign    Worried About Running Out of Food in the Last Year: Never true    Ran Out of Food in the Last Year: Never true  Transportation Needs: No Transportation Needs (10/18/2022)   PRAPARE - Hydrologist (Medical): No    Lack of Transportation (Non-Medical): No  Physical Activity: Inactive (10/18/2022)   Exercise Vital Sign    Days of Exercise per Week: 0 days    Minutes of Exercise per Session: 0 min  Stress: No Stress Concern Present (10/18/2022)   [place]    Feeling of Stress : Not at all  Social Connections: Moderately Isolated (10/18/2022)   Social Connection and Isolation Panel [NHANES]    Frequency of Communication with Friends and Family: More than three times a week    Frequency of Social Gatherings with Friends and Family: More than three times a week    Attends Religious Services: Never    Marine scientist or Organizations: No    Attends Archivist Meetings: Never    Marital Status: Married    Tobacco Counseling Counseling given: Not Answered Tobacco comments: still smokes cigars once a day  Clinical Intake:  Pre-visit preparation completed: Yes  Pain : No/denies pain     Diabetes:  No  How often do you need to have someone help you when you read instructions, pamphlets, or other written materials from your doctor or pharmacy?: 1 - Never  Diabetic?no  Interpreter Needed?: No  Information entered by :: Kirke Shaggy, LPN   Activities of Daily Living    10/18/2022   11:14 AM 08/02/2022    2:28 PM  In your present state of health, do you have any difficulty performing the following activities:  Hearing? 1 1  Vision? 1 1  Difficulty concentrating or making decisions? 0 1  Walking or climbing stairs? 1 1  Dressing or bathing? 1 0  Doing errands, shopping? 1 1  Preparing Food and eating ? N   Using the Toilet? N   In the past six months, have you accidently leaked urine? N   Do you have problems with loss of bowel control? N   Managing your Medications? N   Managing your Finances? Y   Housekeeping or managing your Housekeeping? Y     Patient Care Team: Virginia Crews, MD as PCP - General (Family Medicine) Wellington Hampshire, MD as PCP - Cardiology (Cardiology) Margaretha Sheffield, MD (Otolaryngology) Leandrew Koyanagi, MD as Referring Physician (Ophthalmology) Sharlotte Alamo, DPM as Consulting Physician (Podiatry) Bernardo Heater, Ronda Fairly, MD as Consulting Physician (Urology) Ree Edman, MD as Consulting Physician (Dermatology) Telford Nab, RN as Registered Nurse [name], MD as Consulting Physician (Gastroenterology) Farrel Demark, Deanne Coffer, PA-C (Cardiology)  Indicate any recent Medical Services you may have received from other than [facility] providers in the past year (date may be approximate).     Assessment:   This is a routine wellness examination for Javion.  Hearing/Vision screen Hearing Screening - Comments:: Wears aids- [facility] Vision Screening - Comments:: Wears glasses- [facility]  Dietary issues and exercise activities discussed: Current Exercise Habits: The patient does not participate in regular exercise at present   Goals Addressed              This Visit's Progress    DIET - EAT MORE FRUITS AND VEGETABLES         Depression Screen    10/18/2022   11:07 AM 08/02/2022    2:28 PM 01/25/2022    4:41 PM 07/13/2021    5:33 PM 06/12/2021    4:25 PM 06/15/2020   11:11 AM 04/01/2019    3:03 PM  PHQ 2/9 Scores  PHQ - 2 Score 0  3 0 0 0 0  PHQ- 9 Score 0  '14 1 1 '$ 0 3  Exception Documentation  Medical reason         Fall Risk    10/18/2022   11:11 AM 08/02/2022    2:27 PM 01/25/2022    4:43 PM 07/13/2021    5:30 PM 06/12/2021    4:24 PM  [place] in the past year? '1 1 1 1 1  '$ Number falls in past yr: 1 0 1 0 0  Comment     fracture  Injury with Fall? '1 1 1 1 1  '$ Risk for fall due to : Impaired mobility;Impaired vision;History of fall(s) History of fall(s);Impaired balance/gait;Mental status change History of fall(s);Impaired balance/gait;Impaired mobility;Mental status change Impaired balance/gait History of fall(s);Impaired mobility  Follow up Falls evaluation completed;Falls prevention discussed Falls evaluation completed;Education provided;Falls prevention discussed Falls evaluation completed  Falls evaluation completed    FALL RISK  PREVENTION PERTAINING TO THE HOME:  Any stairs in or around the home? Yes  If so, are there any without handrails? No  Home free of loose throw rugs in walkways, pet beds, electrical cords, etc? Yes  Adequate lighting in your home to reduce risk of falls? Yes   ASSISTIVE DEVICES UTILIZED TO PREVENT FALLS:  Life alert? No  Use of a cane, walker or w/c? Yes  Grab bars in the bathroom? Yes  Shower chair or bench in shower? Yes  Elevated toilet seat or a handicapped toilet? Yes    Cognitive Function:declined test 2024      03/27/2022    3:26 PM 11/15/2021    3:32 PM 10/24/2020   11:37 AM 06/15/2020   11:45 AM 04/01/2019    3:00 PM  MMSE - Mini Mental State Exam  Orientation to time '4 4 5 5 5  '$ Orientation to Place '4 4 5 5 5  '$ Registration '3 3 3 3 3  '$ Attention/ Calculation  '5 5 5 5 5  '$ Recall '3 3 2 3 3  '$ Language- name 2 objects '2 2 2 2 2  '$ Language- repeat '1 1 1 1 1  '$ Language- follow 3 step command '3 3 3 3 2  '$ Language- read & follow direction '1 1 1 1 1  '$ Write a sentence '1 1 1 1 1  '$ Copy design '1 1 1 1 1  '$ Total score '28 28 29 30 29        '$ 07/13/2021    5:39 PM 01/20/2020   10:55 AM 01/19/2019    9:49 AM 11/11/2017    1:24 PM 10/30/2016    9:58 AM  6CIT Screen  What Year? 0 points 0 points 0 points 0 points 0 points  What month? 0 points 0 points 0 points 0 points 0 points  What time? 0 points 0 points 0 points 0 points 0 points  Count back from 20 0 points 0 points 0 points 0 points 0 points  Months in reverse 0 points 0 points 0 points 2 points 0 points  Repeat phrase 2 points 0 points 0 points 2 points 0 points  Total Score 2 points 0 points 0 points 4 points 0 points    Immunizations Immunization History  Administered Date(s) Administered   Fluad Quad(high Dose 65+) 06/12/2019, 06/30/2020, 07/25/2021, 07/17/2022   Influenza, High Dose Seasonal PF 08/02/2016, 07/17/2017, 07/05/2018   Influenza-Unspecified 08/15/2013   PFIZER(Purple Top)SARS-COV-2 Vaccination 11/30/2019, 12/29/2019, 07/19/2020   Pneumococcal Conjugate,unspecified 04/08/2014   Pneumococcal Conjugate-13 04/08/2014   Pneumococcal Polysaccharide-23 08/07/2003   Td 11/23/2009, 03/27/2022   Td (Adult),5 Lf Tetanus Toxid, Preservative Free 03/27/2022   Td,absorbed, Preservative Free, Adult Use, Lf Unspecified 12/03/2009   Tdap 03/15/2012   Zoster Recombinat (Shingrix) 05/06/2019, 07/30/2019   Zoster, Live 12/27/2008    TDAP status: Due, Education has been provided regarding the importance of this vaccine. Advised may receive this vaccine at local pharmacy or Health Dept. Aware to provide a copy of the vaccination record if obtained from local pharmacy or Health Dept. Verbalized acceptance and understanding.  Flu Vaccine status: Up to date  Pneumococcal vaccine status: Up to  date  Covid-19 vaccine status: Completed vaccines  Qualifies for Shingles Vaccine? Yes   Zostavax completed Yes   Shingrix Completed?: Yes  Screening Tests Health Maintenance  Topic Date Due   FOOT EXAM  11/13/2019   COVID-19 Vaccine (4 - 2023-24 season) 06/15/2022   HEMOGLOBIN A1C  09/26/2022   OPHTHALMOLOGY EXAM  03/27/2023  Medicare Annual Wellness (AWV)  10/19/2023   DTaP/Tdap/Td (6 - Td or Tdap) 03/27/2032   Pneumonia Vaccine 13+ Years old  Completed   INFLUENZA VACCINE  Completed   Zoster Vaccines- Shingrix  Completed   HPV VACCINES  Aged Out    Health Maintenance  Health Maintenance Due  Topic Date Due   FOOT EXAM  11/13/2019   COVID-19 Vaccine (4 - 2023-24 season) 06/15/2022   HEMOGLOBIN A1C  09/26/2022    Colorectal cancer screening: No longer required.   Lung Cancer Screening: (Low Dose CT Chest recommended if Age 45-80 years, 30 pack-year currently smoking OR have quit w/in 15years.) does not qualify.    Additional Screening:  Hepatitis C Screening: does not qualify; Completed no  Vision Screening: Recommended annual ophthalmology exams for early detection of glaucoma and other disorders of the eye. Is the patient up to date with their annual eye exam?  Yes  Who is the provider or what is the name of the office in which the patient attends annual eye exams? [place] If pt is not established with a provider, would they like to be referred to a provider to establish care? No .   Dental Screening: Recommended annual dental exams for proper oral hygiene  Community Resource Referral / Chronic Care Management: CRR required this visit?  No   CCM required this visit?  No      Plan:     I have personally reviewed and noted the following in the patient's chart:   Medical and social history Use of alcohol, tobacco or illicit drugs  Current medications and supplements including opioid prescriptions. Patient is not currently taking opioid  prescriptions. Functional ability and status Nutritional status Physical activity Advanced directives List of other physicians Hospitalizations, surgeries, and ER visits in previous 12 months Vitals Screenings to include cognitive, depression, and falls Referrals and appointments  In addition, I have reviewed and discussed with patient certain preventive protocols, quality metrics, and best practice recommendations. A written personalized care plan for preventive services as well as general preventive health recommendations were provided to patient.     Dionisio David, LPN   02/13/7615   Nurse Notes: none

## 2022-10-18 NOTE — Patient Instructions (Signed)
[name] [name] , Thank you for taking time to come for your Medicare Wellness Visit. I appreciate your ongoing commitment to your health goals. Please review the following plan we discussed and let me know if I can assist you in the future.   Screening recommendations/referrals: Colonoscopy: aged out Recommended yearly ophthalmology/optometry visit for glaucoma screening and checkup Recommended yearly dental visit for hygiene and checkup  Vaccinations: Influenza vaccine: 07/17/22 Pneumococcal vaccine: 04/08/14 Tdap vaccine: 03/15/12, due if have injury Shingles vaccine: Zostavax 12/27/08   Shingrix 05/06/19, 07/30/19   Covid-19: 11/30/19, 12/29/19, 07/19/20  Advanced directives: no  Conditions/risks identified: none  Next appointment: Follow up in one year for your annual wellness visit. 10/22/23 @ 10 am by phone  Preventive Care 65 Years and Older, Male Preventive care refers to lifestyle choices and visits with your health care provider that can promote health and wellness. What does preventive care include? A yearly physical exam. This is also called an annual well check. Dental exams once or twice a year. Routine eye exams. Ask your health care provider how often you should have your eyes checked. Personal lifestyle choices, including: Daily care of your teeth and gums. Regular physical activity. Eating a healthy diet. Avoiding tobacco and drug use. Limiting alcohol use. Practicing safe sex. Taking low doses of aspirin every day. Taking vitamin and mineral supplements as recommended by your health care provider. What happens during an annual well check? The services and screenings done by your health care provider during your annual well check will depend on your age, overall health, lifestyle risk factors, and family history of disease. Counseling  Your health care provider may ask you questions about your: Alcohol use. Tobacco use. Drug use. Emotional well-being. Home and  relationship well-being. Sexual activity. Eating habits. History of falls. Memory and ability to understand (cognition). Work and work Statistician. Screening  You may have the following tests or measurements: Height, weight, and BMI. Blood pressure. Lipid and cholesterol levels. These may be checked every 5 years, or more frequently if you are over 21 years old. Skin check. Lung cancer screening. You may have this screening every year starting at age 47 if you have a 30-pack-year history of smoking and currently smoke or have quit within the past 15 years. Fecal occult blood test (FOBT) of the stool. You may have this test every year starting at age 22. Flexible sigmoidoscopy or colonoscopy. You may have a sigmoidoscopy every 5 years or a colonoscopy every 10 years starting at age 87. Prostate cancer screening. Recommendations will vary depending on your family history and other risks. Hepatitis C blood test. Hepatitis B blood test. Sexually transmitted disease (STD) testing. Diabetes screening. This is done by checking your blood sugar (glucose) after you have not eaten for a while (fasting). You may have this done every 1-3 years. Abdominal aortic aneurysm (AAA) screening. You may need this if you are a current or former smoker. Osteoporosis. You may be screened starting at age 43 if you are at high risk. Talk with your health care provider about your test results, treatment options, and if necessary, the need for more tests. Vaccines  Your health care provider may recommend certain vaccines, such as: Influenza vaccine. This is recommended every year. Tetanus, diphtheria, and acellular pertussis (Tdap, Td) vaccine. You may need a Td booster every 10 years. Zoster vaccine. You may need this after age 87. Pneumococcal 13-valent conjugate (PCV13) vaccine. One dose is recommended after age 34. Pneumococcal polysaccharide (PPSV23) vaccine. One  dose is recommended after age 47. Talk to your  health care provider about which screenings and vaccines you need and how often you need them. This information is not intended to replace advice given to you by your health care provider. Make sure you discuss any questions you have with your health care provider. Document Released: 10/28/2015 Document Revised: 06/20/2016 Document Reviewed: 08/02/2015 Elsevier Interactive Patient Education  2017 [place] Prevention in the Home Falls can cause injuries. They can happen to people of all ages. There are many things you can do to make your home safe and to help prevent falls. What can I do on the outside of my home? Regularly fix the edges of walkways and driveways and fix any cracks. Remove anything that might make you trip as you walk through a door, such as a raised step or threshold. Trim any bushes or trees on the path to your home. Use bright outdoor lighting. Clear any walking paths of anything that might make someone trip, such as rocks or tools. Regularly check to see if handrails are loose or broken. Make sure that both sides of any steps have handrails. Any raised decks and porches should have guardrails on the edges. Have any leaves, snow, or ice cleared regularly. Use sand or salt on walking paths during winter. Clean up any spills in your garage right away. This includes oil or grease spills. What can I do in the bathroom? Use night lights. Install grab bars by the toilet and in the tub and shower. Do not use towel bars as grab bars. Use non-skid mats or decals in the tub or shower. If you need to sit down in the shower, use a plastic, non-slip stool. Keep the floor dry. Clean up any water that spills on the floor as soon as it happens. Remove soap buildup in the tub or shower regularly. Attach bath mats securely with double-sided non-slip rug tape. Do not have throw rugs and other things on the floor that can make you trip. What can I do in the bedroom? Use night  lights. Make sure that you have a light by your bed that is easy to reach. Do not use any sheets or blankets that are too big for your bed. They should not hang down onto the floor. Have a firm chair that has side arms. You can use this for support while you get dressed. Do not have throw rugs and other things on the floor that can make you trip. What can I do in the kitchen? Clean up any spills right away. Avoid walking on wet floors. Keep items that you use a lot in easy-to-reach places. If you need to reach something above you, use a strong step stool that has a grab bar. Keep electrical cords out of the way. Do not use floor polish or wax that makes floors slippery. If you must use wax, use non-skid floor wax. Do not have throw rugs and other things on the floor that can make you trip. What can I do with my stairs? Do not leave any items on the stairs. Make sure that there are handrails on both sides of the stairs and use them. Fix handrails that are broken or loose. Make sure that handrails are as long as the stairways. Check any carpeting to make sure that it is firmly attached to the stairs. Fix any carpet that is loose or worn. Avoid having throw rugs at the top or bottom of the  stairs. If you do have throw rugs, attach them to the floor with carpet tape. Make sure that you have a light switch at the top of the stairs and the bottom of the stairs. If you do not have them, ask someone to add them for you. What else can I do to help prevent falls? Wear shoes that: Do not have high heels. Have rubber bottoms. Are comfortable and fit you well. Are closed at the toe. Do not wear sandals. If you use a stepladder: Make sure that it is fully opened. Do not climb a closed stepladder. Make sure that both sides of the stepladder are locked into place. Ask someone to hold it for you, if possible. Clearly mark and make sure that you can see: Any grab bars or handrails. First and last  steps. Where the edge of each step is. Use tools that help you move around (mobility aids) if they are needed. These include: Canes. Walkers. Scooters. Crutches. Turn on the lights when you go into a dark area. Replace any light bulbs as soon as they burn out. Set up your furniture so you have a clear path. Avoid moving your furniture around. If any of your floors are uneven, fix them. If there are any pets around you, be aware of where they are. Review your medicines with your doctor. Some medicines can make you feel dizzy. This can increase your chance of falling. Ask your doctor what other things that you can do to help prevent falls. This information is not intended to replace advice given to you by your health care provider. Make sure you discuss any questions you have with your health care provider. Document Released: 07/28/2009 Document Revised: 03/08/2016 Document Reviewed: 11/05/2014 Elsevier Interactive Patient Education  2017 [employer].

## 2022-10-22 ENCOUNTER — Telehealth: Payer: Self-pay | Admitting: *Deleted

## 2022-10-22 NOTE — Patient Outreach (Signed)
  Care Coordination   10/22/2022 Name: [name] [name] MRN: [id number] DOB: 01-03-1934   Care Coordination Outreach Attempts:  A third unsuccessful outreach was attempted today to offer the patient with information about available care coordination services as a benefit of their health plan.   Follow Up Plan:  Additional outreach attempts will be made to offer the patient care coordination information and services.   Encounter Outcome:  No Answer   Care Coordination Interventions:  No, not indicated    Valente David, RN, MSN, [facility] Care Management Care Management Coordinator [phone]

## 2022-10-23 ENCOUNTER — Ambulatory Visit: Payer: PPO | Admitting: Physical Therapy

## 2022-10-25 ENCOUNTER — Ambulatory Visit: Payer: PPO | Admitting: Physical Therapy

## 2022-10-25 ENCOUNTER — Telehealth: Payer: Self-pay | Admitting: Family Medicine

## 2022-10-25 DIAGNOSIS — E039 Hypothyroidism, unspecified: Secondary | ICD-10-CM

## 2022-10-25 MED ORDER — ATORVASTATIN CALCIUM 40 MG PO TABS: ORAL_TABLET | ORAL | 0 refills | Status: DC

## 2022-10-25 MED ORDER — LEVOTHYROXINE SODIUM 50 MCG PO TABS: 50.0000 ug | ORAL_TABLET | Freq: Every day | ORAL | 0 refills | Status: DC

## 2022-10-25 NOTE — Telephone Encounter (Signed)
[facility] faxed refill request for the following medications:   atorvastatin (LIPITOR) 40 MG tablet   levothyroxine (SYNTHROID) 50 MCG tablet     Please advise.

## 2022-10-30 ENCOUNTER — Ambulatory Visit: Payer: PPO | Admitting: Physical Therapy

## 2022-10-31 ENCOUNTER — Ambulatory Visit: Payer: PPO | Attending: Cardiovascular Disease

## 2022-10-31 DIAGNOSIS — Z5181 Encounter for therapeutic drug level monitoring: Secondary | ICD-10-CM

## 2022-10-31 DIAGNOSIS — I4891 Unspecified atrial fibrillation: Secondary | ICD-10-CM

## 2022-10-31 DIAGNOSIS — I4892 Unspecified atrial flutter: Secondary | ICD-10-CM

## 2022-10-31 DIAGNOSIS — I482 Chronic atrial fibrillation, unspecified: Secondary | ICD-10-CM | POA: Diagnosis not present

## 2022-10-31 LAB — POCT INR: INR: 5.5 — AB (ref 2.0–3.0)

## 2022-10-31 NOTE — Patient Instructions (Signed)
HOLD TODAY, TOMORROW AND FRIDAY then DECREASE 1 tablet every day EXCEPT 1.5 tablets on TUESDAY AND THURSDAY.  Recheck INR in 3 weeks. Days that he has diarrhea, eat greens

## 2022-11-01 ENCOUNTER — Ambulatory Visit: Payer: PPO | Admitting: Physical Therapy

## 2022-11-05 ENCOUNTER — Encounter: Payer: Self-pay | Admitting: Family Medicine

## 2022-11-05 ENCOUNTER — Ambulatory Visit (INDEPENDENT_AMBULATORY_CARE_PROVIDER_SITE_OTHER): Payer: PPO | Admitting: Family Medicine

## 2022-11-05 VITALS — BP 121/71 | HR 73 | Temp 98.4°F | Resp 16 | Wt 157.0 lb

## 2022-11-05 DIAGNOSIS — F02A 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, mild, without behavioral disturbance, psychotic disturbance, mood disturbance, and anxiety: Secondary | ICD-10-CM | POA: Diagnosis not present

## 2022-11-05 DIAGNOSIS — R8290 Unspecified abnormal findings in urine: Secondary | ICD-10-CM

## 2022-11-05 DIAGNOSIS — E7800 Pure hypercholesterolemia, unspecified: Secondary | ICD-10-CM | POA: Diagnosis not present

## 2022-11-05 DIAGNOSIS — I7300 Raynaud's syndrome without gangrene: Secondary | ICD-10-CM

## 2022-11-05 DIAGNOSIS — E039 Hypothyroidism, unspecified: Secondary | ICD-10-CM | POA: Diagnosis not present

## 2022-11-05 DIAGNOSIS — T17998A Other foreign object in respiratory tract, part unspecified causing other injury, initial encounter: Secondary | ICD-10-CM

## 2022-11-05 DIAGNOSIS — E1142 Type 2 diabetes mellitus with diabetic polyneuropathy: Secondary | ICD-10-CM

## 2022-11-05 DIAGNOSIS — R630 Anorexia: Secondary | ICD-10-CM | POA: Diagnosis not present

## 2022-11-05 DIAGNOSIS — I152 Hypertension secondary to endocrine disorders: Secondary | ICD-10-CM

## 2022-11-05 DIAGNOSIS — E1159 Type 2 diabetes mellitus with other circulatory complications: Secondary | ICD-10-CM

## 2022-11-05 LAB — POCT URINALYSIS DIPSTICK
Bilirubin, UA: NEGATIVE
Blood, UA: NEGATIVE
Glucose, UA: NEGATIVE
Ketones, UA: NEGATIVE
Leukocytes, UA: NEGATIVE
Nitrite, UA: NEGATIVE
Protein, UA: NEGATIVE
Spec Grav, UA: 1.015 (ref 1.010–1.025)
Urobilinogen, UA: 0.2 E.U./dL
pH, UA: 6 (ref 5.0–8.0)

## 2022-11-05 LAB — POCT GLYCOSYLATED HEMOGLOBIN (HGB A1C)
Est. average glucose Bld gHb Est-mCnc: 146
Hemoglobin A1C: 6.7 % — AB (ref 4.0–5.6)

## 2022-11-05 MED ORDER — MEMANTINE HCL 5 MG PO TABS: 5.0000 mg | ORAL_TABLET | Freq: Two times a day (BID) | ORAL | 1 refills | Status: DC

## 2022-11-05 NOTE — Assessment & Plan Note (Signed)
Well controlled Continue current medications Reviewed recent metabolic panel 

## 2022-11-05 NOTE — Assessment & Plan Note (Signed)
New problem Seems to coincide with other GI side effects from Aricept We are changing Aricept and Namenda today We will see if it improves with this change before seeking further workup

## 2022-11-05 NOTE — Progress Notes (Signed)
I,Sulibeya S Dimas,acting as a scribe for Lavon Paganini, MD.,have documented all relevant documentation on the behalf of Lavon Paganini, MD,as directed by  Lavon Paganini, MD while in the presence of Lavon Paganini, MD.     Established patient visit   Patient: [name] [name]   DOB: 1933/11/06   89 y.o. Male  MRN: [id number] Visit Date: 11/05/2022  Today's healthcare provider: Lavon Paganini, MD   Chief Complaint  Patient presents with   Diabetes   Hypothyroidism   Subjective    HPI  Diabetes Mellitus Type II, Follow-up  Lab Results  Component Value Date   HGBA1C 6.7 (A) 11/05/2022   HGBA1C 6.5 (H) 03/27/2022   HGBA1C 6.0 (H) 11/15/2021   Wt Readings from Last 3 Encounters:  11/05/22 157 lb (71.2 kg)  10/18/22 163 lb (73.9 kg)  08/24/22 162 lb (73.5 kg)   Last seen for diabetes 3 months ago.  Management since then includes no changes. No oral medication. He reports excellent compliance with treatment. He is not having side effects.   Home blood sugar records:  not being checked  Episodes of hypoglycemia? No    Current insulin regiment: none Most Recent Eye Exam: UTD  Pertinent Labs: Lab Results  Component Value Date   CHOL 178 10/25/2020   HDL 41 10/25/2020   LDLCALC 110 (H) 10/25/2020   TRIG 153 (H) 10/25/2020   CHOLHDL 4.3 10/25/2020   Lab Results  Component Value Date   NA 138 07/11/2022   K 4.4 07/11/2022   CREATININE 0.96 07/11/2022   GFRNONAA >60 07/11/2022   LABMICR See below: 08/24/2022     --------------------------------------------------------------------------------------------------- Hypothyroid, follow-up  Lab Results  Component Value Date   TSH 3.320 03/27/2022   TSH 3.300 07/30/2019   TSH 2.960 07/10/2018    Wt Readings from Last 3 Encounters:  11/05/22 157 lb (71.2 kg)  10/18/22 163 lb (73.9 kg)  08/24/22 162 lb (73.5 kg)    He was last seen for hypothyroid 3 months ago.  Management since that visit  includes no changes. He reports excellent compliance with treatment. He is not having side effects.  ----------------------------------------------------------------------------------------- Wife is worried about malodorous urine and poor output.   Hands are cold and fingers turn white sometimes  Poor appetite. Only seems to eat about half of what is put on his plate.  Early satiety.  More diarrhea since starting aricept.  Seems to cough after drinking anything. Feels like he aspirates. Doesn't notice with ensure  Wt Readings from Last 3 Encounters:  11/05/22 157 lb (71.2 kg)  10/18/22 163 lb (73.9 kg)  08/24/22 162 lb (73.5 kg)     Medications: Outpatient Medications Prior to Visit  Medication Sig   alendronate (FOSAMAX) 70 MG tablet TAKE 1 TABLET BY MOUTH ONCE A WEEK WITH  A  FULL  GLASS  OF  WATER  ON  AN  EMPTY  STOMACH   aspirin EC 81 MG tablet Take 1 tablet (81 mg total) by mouth daily.   atorvastatin (LIPITOR) 40 MG tablet TAKE 1 TABLET BY MOUTH ONCE DAILY . APPOINTMENT REQUIRED FOR FUTURE REFILLS   latanoprost (XALATAN) 0.005 % ophthalmic solution Place 1 drop into both eyes at bedtime.    levothyroxine (SYNTHROID) 50 MCG tablet Take 1 tablet (50 mcg total) by mouth daily.   metoprolol tartrate (LOPRESSOR) 25 MG tablet Take 1/2 (one-half) tablet by mouth twice daily   pantoprazole (PROTONIX) 40 MG tablet Take 1 tablet by mouth twice daily  warfarin (COUMADIN) 3 MG tablet TAKE 1 TO 1 & 1/2 (ONE TO ONE & ONE-HALF) TABLETS BY MOUTH ONCE DAILY AS DIRECTED   [DISCONTINUED] donepezil (ARICEPT) 5 MG tablet Take 1 tablet by mouth daily.   No facility-administered medications prior to visit.    Review of Systems  Constitutional:  Positive for appetite change and fatigue. Negative for chills.  Respiratory:  Negative for chest tightness and shortness of breath.   Cardiovascular:  Negative for chest pain and leg swelling.  Gastrointestinal:  Positive for abdominal pain and  diarrhea. Negative for constipation, nausea and vomiting.  Endocrine: Positive for cold intolerance.  Psychiatric/Behavioral:  Positive for sleep disturbance.        Objective    BP 121/71 (BP Location: Left Arm, Patient Position: Sitting, Cuff Size: Large)   Pulse 73   Temp 98.4 F (36.9 C) (Temporal)   Resp 16   Wt 157 lb (71.2 kg)   SpO2 95%   BMI 23.18 kg/m  BP Readings from Last 3 Encounters:  11/05/22 121/71  08/24/22 (!) 146/68  08/16/22 (!) 161/77   Wt Readings from Last 3 Encounters:  11/05/22 157 lb (71.2 kg)  10/18/22 163 lb (73.9 kg)  08/24/22 162 lb (73.5 kg)   Physical Exam Vitals reviewed.  Constitutional:      General: He is not in acute distress.    Appearance: Normal appearance. He is not diaphoretic.  HENT:     Head: Normocephalic and atraumatic.  Eyes:     General: No scleral icterus.    Conjunctiva/sclera: Conjunctivae normal.  Cardiovascular:     Rate and Rhythm: Normal rate and regular rhythm.     Heart sounds: Normal heart sounds. No murmur heard. Pulmonary:     Effort: Pulmonary effort is normal. No respiratory distress.     Breath sounds: Normal breath sounds. No wheezing or rhonchi.  Musculoskeletal:     Cervical back: Neck supple.     Right lower leg: No edema.     Left lower leg: No edema.     Comments: Pallor and cool distal fingers with normal cap refill  Lymphadenopathy:     Cervical: No cervical adenopathy.  Skin:    General: Skin is warm and dry.     Capillary Refill: Capillary refill takes less than 2 seconds.     Findings: No rash.  Neurological:     Mental Status: He is alert and oriented to person, place, and time. Mental status is at baseline.  Psychiatric:        Mood and Affect: Mood normal.        Behavior: Behavior normal.       Problem List Items Addressed This Visit       Cardiovascular and Mediastinum   Hypertension associated with diabetes ([place])    Well controlled Continue current medications Reviewed  recent metabolic panel        Endocrine   Diabetes mellitus, type 2 ([place]) - Primary    A1c is stable and well-controlled Diet controlled diabetes-no need for medications today      Relevant Orders   POCT glycosylated hemoglobin (Hb A1C) (Completed)   Adult hypothyroidism    Previously well controlled Continue Synthroid at current dose  Recheck TSH and adjust Synthroid as indicated        Relevant Orders   TSH     Nervous and Auditory   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, mild, without behavioral disturbance, psychotic disturbance, mood disturbance, and anxiety ([place])  Seems to continue to progress Having a lot of GI side effects including substantial diarrhea from Aricept Will DC today Trial of Namenda 5 mg twice daily instead May get GI side effects from this as well, but less likely      Relevant Medications   memantine (NAMENDA) 5 MG tablet     Other   Hypercholesteremia    Previously well controlled Continue statin Repeat FLP - reviewed recent LFTs      Relevant Orders   Lipid panel   Raynaud's phenomenon without gangrene    New problem Seems to have good circulation with normal pulses and cap refill He does have some skin color changes and coldness in his fingers that does start to improve by the end of the visit Encouraged keeping hands warm with gloves or hand warmers      Malodorous urine    UA is normal today Reassurance given No need for additional testing at this time      Relevant Orders   POCT Urinalysis Dipstick (Completed)   Aspiration of liquid    Patient has had been having more coughing when drinking thin liquids that do not seem to occur with thick liquids Concern for possible aspirations Lungs are clear today and no need for imaging Will set him up with speech therapy for further evaluation      Relevant Orders   Ambulatory referral to [facility]   Decrease in appetite    New problem Seems to coincide with other GI  side effects from Aricept We are changing Aricept and Namenda today We will see if it improves with this change before seeking further workup        Return in about 3 months (around 02/04/2023) for chronic disease f/u.      Total time spent on today's visit was greater than 40 minutes, including both face-to-face time and nonface-to-face time personally spent on review of chart (labs and notes), discussing labs and goals, discussing further work-up, treatment options, referrals to specialist, answering patient's questions, and coordinating care.    I, Lavon Paganini, MD, have reviewed all documentation for this visit. The documentation on 11/05/22 for the exam, diagnosis, procedures, and orders are all accurate and complete.   Betsi Crespi, Dionne Bucy, MD, MPH [facility]

## 2022-11-05 NOTE — Assessment & Plan Note (Signed)
Previously well controlled Continue Synthroid at current dose  Recheck TSH and adjust Synthroid as indicated   

## 2022-11-05 NOTE — Assessment & Plan Note (Signed)
New problem Seems to have good circulation with normal pulses and cap refill He does have some skin color changes and coldness in his fingers that does start to improve by the end of the visit Encouraged keeping hands warm with gloves or hand warmers

## 2022-11-05 NOTE — Assessment & Plan Note (Signed)
A1c is stable and well-controlled Diet controlled diabetes-no need for medications today

## 2022-11-05 NOTE — Assessment & Plan Note (Signed)
Previously well controlled Continue statin Repeat FLP - reviewed recent LFTs

## 2022-11-05 NOTE — Assessment & Plan Note (Signed)
UA is normal today Reassurance given No need for additional testing at this time

## 2022-11-05 NOTE — Assessment & Plan Note (Signed)
Seems to continue to progress Having a lot of GI side effects including substantial diarrhea from Aricept Will DC today Trial of Namenda 5 mg twice daily instead May get GI side effects from this as well, but less likely

## 2022-11-05 NOTE — Assessment & Plan Note (Signed)
Patient has had been having more coughing when drinking thin liquids that do not seem to occur with thick liquids Concern for possible aspirations Lungs are clear today and no need for imaging Will set him up with speech therapy for further evaluation

## 2022-11-06 ENCOUNTER — Telehealth: Payer: Self-pay | Admitting: Family Medicine

## 2022-11-06 ENCOUNTER — Telehealth: Payer: Self-pay

## 2022-11-06 ENCOUNTER — Ambulatory Visit: Payer: PPO | Admitting: Physical Therapy

## 2022-11-06 DIAGNOSIS — E039 Hypothyroidism, unspecified: Secondary | ICD-10-CM

## 2022-11-06 LAB — LIPID PANEL
Chol/HDL Ratio: 2.7 ratio (ref 0.0–5.0)
Cholesterol, Total: 134 mg/dL (ref 100–199)
HDL: 50 mg/dL (ref >39)
LDL Chol Calc (NIH): 62 mg/dL (ref 0–99)
Triglycerides: 121 mg/dL (ref 0–149)
VLDL Cholesterol Cal: 22 mg/dL (ref 5–40)

## 2022-11-06 LAB — TSH: TSH: 5.26 u[IU]/mL — ABNORMAL HIGH (ref 0.450–4.500)

## 2022-11-06 MED ORDER — LEVOTHYROXINE SODIUM 75 MCG PO TABS: 75.0000 ug | ORAL_TABLET | Freq: Every day | ORAL | 1 refills | Status: DC

## 2022-11-06 NOTE — Telephone Encounter (Signed)
-----  Message from Virginia Crews, MD sent at 11/06/2022  7:53 AM EST ----- Thyroid hormone is slightly elevated which means synthroid dose may be slightly low.  Any missed doses? If so, make sure to take regularly. If taking regularly, we can increase dose to 75 mcg daily (CMAs ok to order #90 r1). Repeat TSH in 2 months.  Lipids stable.

## 2022-11-06 NOTE — Telephone Encounter (Signed)
Given results, no missed doses on medication. Verbalizes understanding.

## 2022-11-06 NOTE — Progress Notes (Signed)
New dose sent to pharmacy. Lab ordered.

## 2022-11-08 ENCOUNTER — Ambulatory Visit: Payer: PPO | Admitting: Physical Therapy

## 2022-11-13 ENCOUNTER — Ambulatory Visit: Payer: PPO | Admitting: Physical Therapy

## 2022-11-15 ENCOUNTER — Ambulatory Visit: Payer: PPO | Admitting: Physical Therapy

## 2022-11-15 ENCOUNTER — Telehealth: Payer: Self-pay | Admitting: Family Medicine

## 2022-11-15 DIAGNOSIS — I7300 Raynaud's syndrome without gangrene: Secondary | ICD-10-CM | POA: Diagnosis not present

## 2022-11-15 DIAGNOSIS — E1142 Type 2 diabetes mellitus with diabetic polyneuropathy: Secondary | ICD-10-CM | POA: Diagnosis not present

## 2022-11-15 DIAGNOSIS — E7800 Pure hypercholesterolemia, unspecified: Secondary | ICD-10-CM | POA: Diagnosis not present

## 2022-11-15 DIAGNOSIS — E039 Hypothyroidism, unspecified: Secondary | ICD-10-CM | POA: Diagnosis not present

## 2022-11-15 DIAGNOSIS — E1159 Type 2 diabetes mellitus with other circulatory complications: Secondary | ICD-10-CM | POA: Diagnosis not present

## 2022-11-15 DIAGNOSIS — E119 Type 2 diabetes mellitus without complications: Secondary | ICD-10-CM | POA: Diagnosis not present

## 2022-11-15 DIAGNOSIS — Z7982 Long term (current) use of aspirin: Secondary | ICD-10-CM | POA: Diagnosis not present

## 2022-11-15 DIAGNOSIS — Z7901 Long term (current) use of anticoagulants: Secondary | ICD-10-CM | POA: Diagnosis not present

## 2022-11-15 DIAGNOSIS — I152 Hypertension secondary to endocrine disorders: Secondary | ICD-10-CM | POA: Diagnosis not present

## 2022-11-15 DIAGNOSIS — F028 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 without behavioral disturbance: Secondary | ICD-10-CM | POA: Diagnosis not present

## 2022-11-15 NOTE — Telephone Encounter (Signed)
VM left given verbal order

## 2022-11-15 NOTE — Telephone Encounter (Signed)
Home Health Verbal Orders - Caller/Agency: [facility]  Callback Number: [phone] vm can be left  Requesting OT eval and  PT eval and Speech Therapy Frequency: speech Therapy 1 x a week for 8 weeks the eval was done today for this   She also needs an order for a modified varium swallow test

## 2022-11-15 NOTE — Telephone Encounter (Signed)
OK for verbals 

## 2022-11-20 ENCOUNTER — Ambulatory Visit: Payer: PPO | Admitting: Physical Therapy

## 2022-11-21 ENCOUNTER — Ambulatory Visit: Payer: PPO | Attending: Cardiovascular Disease

## 2022-11-21 DIAGNOSIS — I4891 Unspecified atrial fibrillation: Secondary | ICD-10-CM

## 2022-11-21 DIAGNOSIS — I482 Chronic atrial fibrillation, unspecified: Secondary | ICD-10-CM | POA: Diagnosis not present

## 2022-11-21 DIAGNOSIS — I4892 Unspecified atrial flutter: Secondary | ICD-10-CM

## 2022-11-21 DIAGNOSIS — Z5181 Encounter for therapeutic drug level monitoring: Secondary | ICD-10-CM | POA: Diagnosis not present

## 2022-11-21 LAB — POCT INR: INR: 3 (ref 2.0–3.0)

## 2022-11-21 NOTE — Patient Instructions (Signed)
CONTINUE 1 tablet every day EXCEPT 1.5 tablets on TUESDAY AND THURSDAY.  Recheck INR in 4 weeks.

## 2022-11-22 ENCOUNTER — Ambulatory Visit: Payer: PPO | Admitting: Physical Therapy

## 2022-11-23 ENCOUNTER — Ambulatory Visit: Payer: PPO | Admitting: Cardiovascular Disease

## 2022-11-26 ENCOUNTER — Telehealth: Payer: Self-pay | Admitting: Family Medicine

## 2022-11-26 ENCOUNTER — Other Ambulatory Visit: Payer: Self-pay | Admitting: Family Medicine

## 2022-11-26 DIAGNOSIS — F02A 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, mild, without behavioral disturbance, psychotic disturbance, mood disturbance, and anxiety: Secondary | ICD-10-CM

## 2022-11-26 DIAGNOSIS — T17998A Other foreign object in respiratory tract, part unspecified causing other injury, initial encounter: Secondary | ICD-10-CM

## 2022-11-26 DIAGNOSIS — M8008XA Age-related osteoporosis with current pathological fracture, vertebra(e), initial encounter for fracture: Secondary | ICD-10-CM

## 2022-11-26 NOTE — Telephone Encounter (Signed)
Medication Refill - Medication:  Alendronate 70 mg  Has the patient contacted their pharmacy? Yes.   (Agent: If no, request that the patient contact the pharmacy for the refill. If patient does not wish to contact the pharmacy document the reason why and proceed with request.) (Agent: If yes, when and what did the pharmacy advise?)  Preferred Pharmacy (with phone number or street name): [facility]  Has the patient been seen for an appointment in the last year OR does the patient have an upcoming appointment? Yes.    Agent: Please be advised that RX refills may take up to 3 business days. We ask that you follow-up with your pharmacy.

## 2022-11-26 NOTE — Telephone Encounter (Signed)
Lucretia Kern NP - Behavioral Health Consultant with [facility] is calling to report when she saw the patient on 11/20/22. Pt reported that memantine (NAMENDA) 5 MG tablet [id number] caused dizziness. Recommended one table at bedtime or full dosage at bedtime.  Wife of patient wanted to follow up on swallowing test.  CB- [phone] Cell [phone] Direct voicemail

## 2022-11-27 ENCOUNTER — Ambulatory Visit: Payer: PPO | Admitting: Physical Therapy

## 2022-11-27 MED ORDER — ALENDRONATE SODIUM 70 MG PO TABS: ORAL_TABLET | ORAL | 1 refills | Status: DC

## 2022-11-27 MED ORDER — MEMANTINE HCL 5 MG PO TABS: 5.0000 mg | ORAL_TABLET | Freq: Every day | ORAL | 1 refills | Status: DC

## 2022-11-27 NOTE — Telephone Encounter (Signed)
Patient's wife called in following up on the status of the medication refill. Wife expressed that the patient only has one tablet left.

## 2022-11-27 NOTE — Telephone Encounter (Signed)
Patient called, left VM to return the call to the office for a message from the provider.

## 2022-11-27 NOTE — Telephone Encounter (Signed)
Lmtcb. PEC please advise as below. 

## 2022-11-27 NOTE — Telephone Encounter (Signed)
Ok to take namenda 39m qhs instead of BID.  It looks like there was a note at the bottom of a phone call for verbal orders about ordering a modified barium swallow study and we missed it. Can you please order this now? Thanks

## 2022-11-27 NOTE — Telephone Encounter (Signed)
Requested medications are due for refill today.  yes  Requested medications are on the active medications list.  yes  Last refill. 09/03/2022 #12 0 rf  Future visit scheduled.   no  Notes to clinic.  Missing/expired labs.    Requested Prescriptions  Pending Prescriptions Disp Refills   alendronate (FOSAMAX) 70 MG tablet 12 tablet 0    Sig: TAKE 1 TABLET BY MOUTH ONCE A WEEK WITH  A  FULL  GLASS  OF  WATER  ON  AN  EMPTY  STOMACH     Endocrinology:  Bisphosphonates Failed - 11/26/2022  2:58 PM      Failed - Vitamin D in normal range and within 360 days    No results found for: "[id number]", "[id number]", "[id number]", "25OHVITD3", "25OHVITD2", "25OHVITD1", "VD25OH"       Failed - Mg Level in normal range and within 360 days    Magnesium  Date Value Ref Range Status  12/01/2018 2.1 1.7 - 2.4 mg/dL Final    Comment:    Performed at [facility], [address] [place], [place] [address]         Failed - Phosphate in normal range and within 360 days    Phosphorus  Date Value Ref Range Status  12/01/2018 3.7 2.5 - 4.6 mg/dL Final    Comment:    Performed at [facility], [address] [place], [place] [address]         Passed - Ca in normal range and within 360 days    Calcium  Date Value Ref Range Status  07/11/2022 9.2 8.9 - 10.3 mg/dL Final         Passed - Cr in normal range and within 360 days    Creatinine, Ser  Date Value Ref Range Status  07/11/2022 0.96 0.61 - 1.24 mg/dL Final         Passed - eGFR is 30 or above and within 360 days    GFR calc Af Amer  Date Value Ref Range Status  10/25/2020 81 >59 mL/min/1.73 Final    Comment:    **In accordance with recommendations from the NKF-ASN Task force,**   [facility] is in the process of updating its eGFR calculation to the   2021 CKD-EPI creatinine equation that estimates kidney function   without a race variable.    GFR, Estimated  Date Value Ref Range Status  07/11/2022 >60 >60 mL/min  Final    Comment:    (NOTE) Calculated using the CKD-EPI Creatinine Equation (2021)    eGFR  Date Value Ref Range Status  11/15/2021 86 >59 mL/min/1.73 Final         Passed - Valid encounter within last 12 months    Recent Outpatient Visits           3 weeks ago Type 2 diabetes mellitus with diabetic polyneuropathy, without long-term current use of insulin ([place])   [facility] [place], Dionne Bucy, MD   3 months ago Type 2 diabetes mellitus with diabetic polyneuropathy, without long-term current use of insulin [facility])   [place], Dionne Bucy, MD   4 months ago Need for immunization against influenza   [place], Riki Sheer, MD   8 months ago Essential (primary) hypertension   [place] E[place] Post, MD   10 months ago Mitral and aortic incompetence   [place] Eulas Post, MD  Future Appointments             In 2 months Fletcher Anon, Mertie Clause, MD [place] [facility] [place] or Dexa Scan completed in the last 2 years

## 2022-11-28 ENCOUNTER — Other Ambulatory Visit: Payer: Self-pay | Admitting: Family Medicine

## 2022-11-28 DIAGNOSIS — M8008XA Age-related osteoporosis with current pathological fracture, vertebra(e), initial encounter for fracture: Secondary | ICD-10-CM

## 2022-11-29 ENCOUNTER — Ambulatory Visit: Payer: PPO | Admitting: Physical Therapy

## 2022-12-04 ENCOUNTER — Ambulatory Visit: Payer: PPO | Admitting: Physical Therapy

## 2022-12-06 ENCOUNTER — Ambulatory Visit: Payer: PPO | Admitting: Physical Therapy

## 2022-12-11 ENCOUNTER — Ambulatory Visit: Payer: PPO | Admitting: Physical Therapy

## 2022-12-11 DIAGNOSIS — Z7901 Long term (current) use of anticoagulants: Secondary | ICD-10-CM | POA: Diagnosis not present

## 2022-12-11 DIAGNOSIS — E039 Hypothyroidism, unspecified: Secondary | ICD-10-CM | POA: Diagnosis not present

## 2022-12-11 DIAGNOSIS — E1142 Type 2 diabetes mellitus with diabetic polyneuropathy: Secondary | ICD-10-CM | POA: Diagnosis not present

## 2022-12-11 DIAGNOSIS — I152 Hypertension secondary to endocrine disorders: Secondary | ICD-10-CM | POA: Diagnosis not present

## 2022-12-11 DIAGNOSIS — E119 Type 2 diabetes mellitus without complications: Secondary | ICD-10-CM | POA: Diagnosis not present

## 2022-12-11 DIAGNOSIS — E7800 Pure hypercholesterolemia, unspecified: Secondary | ICD-10-CM | POA: Diagnosis not present

## 2022-12-11 DIAGNOSIS — E1159 Type 2 diabetes mellitus with other circulatory complications: Secondary | ICD-10-CM | POA: Diagnosis not present

## 2022-12-11 DIAGNOSIS — I7300 Raynaud's syndrome without gangrene: Secondary | ICD-10-CM | POA: Diagnosis not present

## 2022-12-11 DIAGNOSIS — Z7982 Long term (current) use of aspirin: Secondary | ICD-10-CM | POA: Diagnosis not present

## 2022-12-11 DIAGNOSIS — F028 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 without behavioral disturbance: Secondary | ICD-10-CM | POA: Diagnosis not present

## 2022-12-13 ENCOUNTER — Ambulatory Visit: Payer: PPO | Admitting: Physical Therapy

## 2022-12-17 ENCOUNTER — Telehealth: Payer: Self-pay

## 2022-12-17 ENCOUNTER — Telehealth: Payer: Self-pay | Admitting: Family Medicine

## 2022-12-17 ENCOUNTER — Other Ambulatory Visit: Payer: Self-pay | Admitting: Cardiovascular Disease

## 2022-12-17 NOTE — Telephone Encounter (Signed)
[facility] requesting prescription refill metoprolol tartrate (LOPRESSOR) 25 MG tablet  Please advise

## 2022-12-17 NOTE — Telephone Encounter (Signed)
Advise wife to request from Dr Tyrell Antonio office instead or fax it back to [facility] with a note that this is filled by Dr Fletcher Anon, not [place]

## 2022-12-17 NOTE — Telephone Encounter (Signed)
[name] [name] advised.

## 2022-12-17 NOTE — Telephone Encounter (Signed)
OK for verbals 

## 2022-12-17 NOTE — Telephone Encounter (Signed)
Copied from [place] [id number]. Topic: Quick Communication - Home Health Verbal Orders >> Dec 14, 2022  8:10 AM Marcellus Scott wrote: Caller/Agency: [place] Number:  [phone] Requesting OT/PT/Skilled Nursing/Social Work/Speech Therapy: OT Frequency: 1w5

## 2022-12-18 ENCOUNTER — Ambulatory Visit: Payer: PPO | Admitting: Physical Therapy

## 2022-12-18 ENCOUNTER — Other Ambulatory Visit: Payer: Self-pay | Admitting: Cardiovascular Disease

## 2022-12-18 DIAGNOSIS — Z7901 Long term (current) use of anticoagulants: Secondary | ICD-10-CM | POA: Diagnosis not present

## 2022-12-18 DIAGNOSIS — E1142 Type 2 diabetes mellitus with diabetic polyneuropathy: Secondary | ICD-10-CM | POA: Diagnosis not present

## 2022-12-18 DIAGNOSIS — E119 Type 2 diabetes mellitus without complications: Secondary | ICD-10-CM | POA: Diagnosis not present

## 2022-12-18 DIAGNOSIS — I152 Hypertension secondary to endocrine disorders: Secondary | ICD-10-CM | POA: Diagnosis not present

## 2022-12-18 DIAGNOSIS — E7800 Pure hypercholesterolemia, unspecified: Secondary | ICD-10-CM | POA: Diagnosis not present

## 2022-12-18 DIAGNOSIS — E039 Hypothyroidism, unspecified: Secondary | ICD-10-CM | POA: Diagnosis not present

## 2022-12-18 DIAGNOSIS — E1159 Type 2 diabetes mellitus with other circulatory complications: Secondary | ICD-10-CM | POA: Diagnosis not present

## 2022-12-18 DIAGNOSIS — Z7982 Long term (current) use of aspirin: Secondary | ICD-10-CM | POA: Diagnosis not present

## 2022-12-18 DIAGNOSIS — F028 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 without behavioral disturbance: Secondary | ICD-10-CM | POA: Diagnosis not present

## 2022-12-18 DIAGNOSIS — I7300 Raynaud's syndrome without gangrene: Secondary | ICD-10-CM | POA: Diagnosis not present

## 2022-12-18 MED ORDER — METOPROLOL TARTRATE 25 MG PO TABS: ORAL_TABLET | ORAL | 0 refills | Status: DC

## 2022-12-18 NOTE — Telephone Encounter (Signed)
Patient has contacted Dr. Tyrell Antonio office.

## 2022-12-19 ENCOUNTER — Ambulatory Visit: Payer: PPO | Attending: Cardiovascular Disease

## 2022-12-19 DIAGNOSIS — I482 Chronic atrial fibrillation, unspecified: Secondary | ICD-10-CM

## 2022-12-19 DIAGNOSIS — Z5181 Encounter for therapeutic drug level monitoring: Secondary | ICD-10-CM | POA: Diagnosis not present

## 2022-12-19 DIAGNOSIS — I4892 Unspecified atrial flutter: Secondary | ICD-10-CM | POA: Diagnosis not present

## 2022-12-19 DIAGNOSIS — I4891 Unspecified atrial fibrillation: Secondary | ICD-10-CM

## 2022-12-19 LAB — POCT INR: INR: 3.4 — AB (ref 2.0–3.0)

## 2022-12-19 NOTE — Patient Instructions (Signed)
Description   CONTINUE 1 tablet every day EXCEPT 1.5 tablets on [place].  Recheck INR in 4 weeks.

## 2022-12-20 ENCOUNTER — Ambulatory Visit
Admission: RE | Admit: 2022-12-20 | Discharge: 2022-12-20 | Disposition: A | Discharge status: Home / Self Care | Payer: PPO | Source: Ambulatory Visit | Attending: Family Medicine | Admitting: Family Medicine

## 2022-12-20 ENCOUNTER — Ambulatory Visit: Payer: PPO | Admitting: Physical Therapy

## 2022-12-20 DIAGNOSIS — F02A 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, mild, without behavioral disturbance, psychotic disturbance, mood disturbance, and anxiety: Secondary | ICD-10-CM | POA: Diagnosis not present

## 2022-12-20 DIAGNOSIS — T17998A Other foreign object in respiratory tract, part unspecified causing other injury, initial encounter: Secondary | ICD-10-CM | POA: Insufficient documentation

## 2022-12-20 DIAGNOSIS — R1312 Dysphagia, oropharyngeal phase: Secondary | ICD-10-CM | POA: Diagnosis not present

## 2022-12-20 NOTE — Progress Notes (Signed)
Modified Barium Swallow Study  Patient Details  Name: [name] [name] MRN: [id number] Date of Birth: [birth year]  Today's Date: 12/20/2022  Modified Barium Swallow completed.  Full report located under Chart Review in the Imaging Section.  History of Present Illness Pt is an 89 yo male w/ PMH significant for Multiple medical dxs including Vascular Dementia, stroke, unsteady gait, hypothyroidism, CAD, DM, HTN, neurolpathy, bladder Ca, and current tx for "spot on his lung", per Wife.  Pt and Wife denied any recent dx of pnuemonia - "hasn't had that in years". Wife endorsed "some" coughing at meals; and w/ thin liquids. No change in diet; no wieght loss. Pt is drinking Boost in his diet per Wife.   Clinical Impression Patient presents with functional oral phase swallowing abilities with Mild-Mod pharyngeal phase dypshagia in setting of diagnosed dx'd Vascular Dementia and suspicion of potential Esophageal phase Dysmotility.  Oral phase is characterized by adequate lip closure, bolus preparation and containment, and anterior to posterior transit w/ fairly consistent bolus cohesion of thin liquids. Swallow initiation occurs at the level of spilling to the pyriform sinuses for liquids; at the valleculae for foods.  Pharyngeal phase is noted for slight-mildly reduced tongue base retraction, hyolaryngeal excursion and anterior movement, slightly reduced pharyngeal constriction. Epiglottic deflection is incomplete moreso w/ liquids resulting in laryngeal penetration and aspiration w/ both thin and nectar liquids w/ a min coating along underneath side of the epiglottis remaining post trials of liquids. Delayed Cough was present w/ the aspiratoin of thin liquids.  Compensatory Strategy: pt was instructed on using a CHIN TUCK during swallowing of thin liquids which appeared to aid in airway protection w/ no further aspiration noted w/ thin liquids during study. Pharyngeal stripping wave is mildly reduced w/ trace+  BOT and vallecule residue w/ increased texture/foods.   Amplitude/duration of cricopharyngeus opening is [facility] for adequate/complete clearance of boluses through the cervical esophagus; however, noted both (slight) Anterior and (mod) posterior prominences of the CP segment. Suspect this presenation of the UES/Esophagus could be related to the feelings of foods "getting hung" - such as the rice pt/Wife c/o. Any suck Esophageal presentation could have impact on the pharyngeal phase of swallow and clearance og bolus residue also.   Consistencies Tested: thin liquids x2 tsps, 7 cup sip - 5 w/ chin tuck, nectar x1 tsp, 2 cup sip - 1 w/ chin tuck, honey x1 tsp, pudding x1 tsp, regular solid (1/2 graham cracker with pudding).  Discussed Compensatory Swallowing Strategies w/ both pt and Wife -- practiced utilzing the CHIN TUCK strategy w/ sips of thin liquids; demonstrated for Wife. Discussed other general aspiration precautions; food consistencies and reflux precautions including moistening dry meats, cut foods small, alternating solids and liquids, f/u Dry swallow intermittently for clearing.  Recommend continue [facility] services for ongoing Education, tx. Factors that may increase risk of adverse event in presence of aspiration Phineas Douglas & Padilla 2021):  (Vascular Dementia)  Swallow Evaluation Recommendations Recommendations: PO diet PO Diet Recommendation: Regular;Thin liquids (Level 0) (cut foods; moistened foods) Liquid Administration via: Cup;No straw Medication Administration: Whole meds with puree Supervision: Patient able to self-feed;Intermittent supervision/cueing for swallowing strategies Swallowing strategies  : Minimize environmental distractions;Slow rate;Small bites/sips;Multiple dry swallows after each bite/sip;Follow solids with liquids;Chin tuck (Cough/throat clear at end of meals/po's) Postural changes: Position pt fully upright for meals;Stay upright 30-60 min after meals Oral care  recommendations: Oral care BID (2x/day);Oral care before PO;Pt independent with oral care Recommended consults: Consider Palliative care (Dietician)  Orinda Kenner, MS, CCC-SLP Speech Language Pathologist Rehab Services; [place] [phone] (ascom) Glorianne Proctor 12/20/2022,4:18 PM

## 2022-12-25 ENCOUNTER — Ambulatory Visit: Payer: PPO | Admitting: Physical Therapy

## 2022-12-27 ENCOUNTER — Ambulatory Visit: Payer: PPO | Admitting: Physical Therapy

## 2023-01-01 ENCOUNTER — Ambulatory Visit: Payer: PPO | Admitting: Physical Therapy

## 2023-01-02 DIAGNOSIS — Z7982 Long term (current) use of aspirin: Secondary | ICD-10-CM | POA: Diagnosis not present

## 2023-01-02 DIAGNOSIS — I7300 Raynaud's syndrome without gangrene: Secondary | ICD-10-CM | POA: Diagnosis not present

## 2023-01-02 DIAGNOSIS — I152 Hypertension secondary to endocrine disorders: Secondary | ICD-10-CM | POA: Diagnosis not present

## 2023-01-02 DIAGNOSIS — E1142 Type 2 diabetes mellitus with diabetic polyneuropathy: Secondary | ICD-10-CM | POA: Diagnosis not present

## 2023-01-02 DIAGNOSIS — E119 Type 2 diabetes mellitus without complications: Secondary | ICD-10-CM | POA: Diagnosis not present

## 2023-01-02 DIAGNOSIS — E7800 Pure hypercholesterolemia, unspecified: Secondary | ICD-10-CM | POA: Diagnosis not present

## 2023-01-02 DIAGNOSIS — F028 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 without behavioral disturbance: Secondary | ICD-10-CM | POA: Diagnosis not present

## 2023-01-02 DIAGNOSIS — Z7901 Long term (current) use of anticoagulants: Secondary | ICD-10-CM | POA: Diagnosis not present

## 2023-01-02 DIAGNOSIS — E1159 Type 2 diabetes mellitus with other circulatory complications: Secondary | ICD-10-CM | POA: Diagnosis not present

## 2023-01-02 DIAGNOSIS — E039 Hypothyroidism, unspecified: Secondary | ICD-10-CM | POA: Diagnosis not present

## 2023-01-03 ENCOUNTER — Ambulatory Visit: Payer: PPO | Admitting: Physical Therapy

## 2023-01-09 ENCOUNTER — Ambulatory Visit
Admission: RE | Admit: 2023-01-09 | Discharge: 2023-01-09 | Disposition: A | Discharge status: Home / Self Care | Payer: PPO | Source: Ambulatory Visit | Attending: Internal Medicine | Admitting: Internal Medicine

## 2023-01-09 DIAGNOSIS — E039 Hypothyroidism, unspecified: Secondary | ICD-10-CM | POA: Diagnosis not present

## 2023-01-09 DIAGNOSIS — E119 Type 2 diabetes mellitus without complications: Secondary | ICD-10-CM | POA: Diagnosis not present

## 2023-01-09 DIAGNOSIS — Z7901 Long term (current) use of anticoagulants: Secondary | ICD-10-CM | POA: Diagnosis not present

## 2023-01-09 DIAGNOSIS — R590 Localized enlarged lymph nodes: Secondary | ICD-10-CM | POA: Diagnosis not present

## 2023-01-09 DIAGNOSIS — I7300 Raynaud's syndrome without gangrene: Secondary | ICD-10-CM | POA: Diagnosis not present

## 2023-01-09 DIAGNOSIS — R911 Solitary pulmonary nodule: Secondary | ICD-10-CM | POA: Diagnosis not present

## 2023-01-09 DIAGNOSIS — E1142 Type 2 diabetes mellitus with diabetic polyneuropathy: Secondary | ICD-10-CM | POA: Diagnosis not present

## 2023-01-09 DIAGNOSIS — Z7982 Long term (current) use of aspirin: Secondary | ICD-10-CM | POA: Diagnosis not present

## 2023-01-09 DIAGNOSIS — I152 Hypertension secondary to endocrine disorders: Secondary | ICD-10-CM | POA: Diagnosis not present

## 2023-01-09 DIAGNOSIS — J9811 Atelectasis: Secondary | ICD-10-CM | POA: Diagnosis not present

## 2023-01-09 DIAGNOSIS — J90 Pleural effusion, not elsewhere classified: Secondary | ICD-10-CM | POA: Diagnosis not present

## 2023-01-09 DIAGNOSIS — R918 Other nonspecific abnormal finding of lung field: Secondary | ICD-10-CM | POA: Diagnosis not present

## 2023-01-09 DIAGNOSIS — F028 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 without behavioral disturbance: Secondary | ICD-10-CM | POA: Diagnosis not present

## 2023-01-09 DIAGNOSIS — E1159 Type 2 diabetes mellitus with other circulatory complications: Secondary | ICD-10-CM | POA: Diagnosis not present

## 2023-01-09 DIAGNOSIS — E7800 Pure hypercholesterolemia, unspecified: Secondary | ICD-10-CM | POA: Diagnosis not present

## 2023-01-11 ENCOUNTER — Encounter: Payer: Self-pay | Admitting: Nurse Practitioner

## 2023-01-11 ENCOUNTER — Inpatient Hospital Stay (HOSPITAL_BASED_OUTPATIENT_CLINIC_OR_DEPARTMENT_OTHER): Payer: PPO | Admitting: Nurse Practitioner

## 2023-01-11 ENCOUNTER — Inpatient Hospital Stay: Payer: PPO | Attending: Oncology

## 2023-01-11 VITALS — BP 128/61 | HR 70 | Temp 98.3°F | Resp 17 | Wt 158.0 lb

## 2023-01-11 DIAGNOSIS — K7460 Unspecified cirrhosis of liver: Secondary | ICD-10-CM | POA: Diagnosis not present

## 2023-01-11 DIAGNOSIS — R590 Localized enlarged lymph nodes: Secondary | ICD-10-CM | POA: Insufficient documentation

## 2023-01-11 DIAGNOSIS — I4891 Unspecified atrial fibrillation: Secondary | ICD-10-CM | POA: Diagnosis not present

## 2023-01-11 DIAGNOSIS — R911 Solitary pulmonary nodule: Secondary | ICD-10-CM

## 2023-01-11 DIAGNOSIS — Z862 Personal history of diseases of the blood and blood-forming organs and certain disorders involving the immune mechanism: Secondary | ICD-10-CM | POA: Insufficient documentation

## 2023-01-11 DIAGNOSIS — Z85528 Personal history of other malignant neoplasm of kidney: Secondary | ICD-10-CM | POA: Insufficient documentation

## 2023-01-11 DIAGNOSIS — I700 Atherosclerosis of aorta: Secondary | ICD-10-CM | POA: Insufficient documentation

## 2023-01-11 DIAGNOSIS — Z7901 Long term (current) use of anticoagulants: Secondary | ICD-10-CM | POA: Diagnosis not present

## 2023-01-11 DIAGNOSIS — F0390 Unspecified dementia without behavioral disturbance: Secondary | ICD-10-CM | POA: Diagnosis not present

## 2023-01-11 LAB — COMPREHENSIVE METABOLIC PANEL
ALT: 18 U/L (ref 0–44)
AST: 35 U/L (ref 15–41)
Albumin: 3.7 g/dL (ref 3.5–5.0)
Alkaline Phosphatase: 97 U/L (ref 38–126)
Anion gap: 14 (ref 5–15)
BUN: 17 mg/dL (ref 8–23)
CO2: 27 mmol/L (ref 22–32)
Calcium: 9.2 mg/dL (ref 8.9–10.3)
Chloride: 97 mmol/L — ABNORMAL LOW (ref 98–111)
Creatinine, Ser: 0.97 mg/dL (ref 0.61–1.24)
GFR, Estimated: >60 mL/min (ref >60)
Glucose, Bld: 218 mg/dL — ABNORMAL HIGH (ref 70–99)
Potassium: 4.1 mmol/L (ref 3.5–5.1)
Sodium: 138 mmol/L (ref 135–145)
Total Bilirubin: 1.1 mg/dL (ref 0.3–1.2)
Total Protein: 7.6 g/dL (ref 6.5–8.1)

## 2023-01-11 LAB — CBC WITH DIFFERENTIAL/PLATELET
Abs Immature Granulocytes: 0.04 10*3/uL (ref 0.00–0.07)
Basophils Absolute: 0.1 10*3/uL (ref 0.0–0.1)
Basophils Relative: 1 %
Eosinophils Absolute: 0.3 10*3/uL (ref 0.0–0.5)
Eosinophils Relative: 3 %
HCT: 38.6 % — ABNORMAL LOW (ref 39.0–52.0)
Hemoglobin: 12.7 g/dL — ABNORMAL LOW (ref 13.0–17.0)
Immature Granulocytes: 1 %
Lymphocytes Relative: 23 %
Lymphs Abs: 2 10*3/uL (ref 0.7–4.0)
MCH: 31.2 pg (ref 26.0–34.0)
MCHC: 32.9 g/dL (ref 30.0–36.0)
MCV: 94.8 fL (ref 80.0–100.0)
Monocytes Absolute: 0.6 10*3/uL (ref 0.1–1.0)
Monocytes Relative: 7 %
Neutro Abs: 5.5 10*3/uL (ref 1.7–7.7)
Neutrophils Relative %: 65 %
Platelets: 191 10*3/uL (ref 150–400)
RBC: 4.07 MIL/uL — ABNORMAL LOW (ref 4.22–5.81)
RDW: 12.9 % (ref 11.5–15.5)
WBC: 8.4 10*3/uL (ref 4.0–10.5)
nRBC: 0 % (ref 0.0–0.2)

## 2023-01-11 LAB — LACTATE DEHYDROGENASE: LDH: 248 U/L — ABNORMAL HIGH (ref 98–192)

## 2023-01-11 NOTE — Progress Notes (Signed)
[facility] NOTE  Patient Care Team: Brita Romp Dionne Bucy, MD as PCP - General (Family Medicine) Wellington Hampshire, MD as PCP - Cardiology (Cardiology) Margaretha Sheffield, MD (Otolaryngology) Leandrew Koyanagi, MD as Referring Physician (Ophthalmology) Sharlotte Alamo, DPM as Consulting Physician (Podiatry) Bernardo Heater, Ronda Fairly, MD as Consulting Physician (Urology) Ree Edman, MD as Consulting Physician (Dermatology) Telford Nab, RN as Registered Nurse [name], MD as Consulting Physician (Gastroenterology) Cockfield, Deanne Coffer, PA-C (Cardiology)  CHIEF COMPLAINTS/PURPOSE OF CONSULTATION:  Mediastinal adenopathy  #  Oncology History Overview Note  #February 2020 gastroenteropathy/right supraclavicular adenopathy-incidental]-PET scan 2020-low-grade FDG activity-differential low-grade lymphoma versus benign reactive process. no biopsy  #Right kidney-cancer status post cryo [[facility]]  # 2020- colonoscopy s/p polypectomy  [Dr.Wohl-]  #CAD status post CABG; mechanical valve replacement [2019 August; [facility]]; on Coumadin     Adenocarcinoma, renal cell ([place])  06/13/2015 Initial Diagnosis   Adenocarcinoma, renal cell ([place])   Cancer of right kidney ([place])  12/10/2018 Initial Diagnosis   Cancer of right kidney ([place])     HISTORY OF PRESENTING ILLNESS: Accompanied by his wife and son. He is walking with a cane and uses wheelchair.   [name] [name] 89 y.o. male with multiple comorbidities including dementia, a fib/mechanical valve, on coumadin, compensated cirrhosis, returns to clinic for follow up of mediastinal and neck adenopathy and anemia. In interim he has had CT scan and presents for discussion of results. He continues to have dementia related symptoms including possible aspiration, gait instability. No unintentional weight loss. Had one episode of hemoptysis after a coughing episode   Review of Systems  Unable to perform ROS: Dementia   Constitutional:  Negative for weight loss.    MEDICAL HISTORY:  Past Medical History:  Diagnosis Date   Acute upper GI bleed    Aortic stenosis    a. 01/2016 echo: mod AS; b. 04/2018 Cardiac MRI: sev AS; c. 05/2018 s/p mech AVR @ time of myomectomy; d. 07/2018 Echo: Mild AS/AI.   Atherosclerosis of aorta ([place])    by CT scan in past   Atrial fibrillation ([facility])    and aflutter. pt has a left atrial circuit that is not ablated. was on amiodarone-stopped, now use rate control.    Bladder cancer ([place])    hx; treated with BCG in past    Bleeding duodenal ulcer    CAP (community acquired pneumonia) 06/13/2015   Carotid artery disease ([place])    There was calcified plaque but no stenosis by carotid artery screening done at [facility] October, 2013   CHB (complete heart block) [facility])    a. 05/2018 s/p BSX VVIR PPM ([facility]) in setting of bradycardia following myomectomy and AVR/MVR.   Diabetes mellitus    type not specified   Diabetic neuropathy ([place])    feet   Elevated liver enzymes    over time; hx   Excessive sweating    Glaucoma    Gout    Head injury    when slipped on ice 2004-2005. stabilized and back on Coumadin    Headache    migraines - distant past   History of cardiac cath    a. 05/2018 Cath ([facility]): Non-obstructive CAD. Mildly elevated filling pressures w/ nl CI.   HOCM (hypertrophic obstructive cardiomyopathy) ([place])    a. 01/2016 Echo: EF 65-70%, no rwma, LVOT gradient of 80-60mmHg, mod AS, SAM; b. 11/2017 Echo: EF 55-60%, near cavity obliteration in systole, mod AS, mild MS; c. 04/2018 Card MRI ([facility]): Sev  LVH, EF >70%, Sev AS, LVOT obs w/ Sev MR, mild to mod TR/PR, mid-myocardial HE in basal-mid anteroseptum and inferoseptum; d. 05/2018 s/p Septal myomectomy; e. 07/2018 Echo: EF 60-65%. PASP 65mmHg.   Homocystinemia    elevated, mild    Hypercholesterolemia    treated.    Hypertension    Infection of right inner ear    Kidney mass    a. s/p laproscopic surgery woth  cryoablation of a mass outside kidney followed at [facility]; b. 11/2018 CT Chest: 2cm R kidney mass.   Mediastinal adenopathy    a. 12/208 CT Chest: up to 68mm LLL lung nodule. 1.5-1.7cm subcarinal/mediastinal adenopahty/right paratracheal lymph node; b. 11/2018 CT Chest: 2.5-3cm mediastinal adenopathy.   Mitral regurgitation    a. 04/2018 Cardiac MRI: sev MR; b. 05/2018 s/p mech MVR @ time of myomectomy; c. 07/2018 Echo: Mild MS.   Motion sickness    ocean boats   Nausea    Nonobstructive coronary artery disease    a. 05/2018 Cath ([facility]): nonobs CAD.   Obstructive sleep apnea    CPAP started successfully 2014   Orthostatic hypotension    a. orthostatic. dehydration. hospitalized 11/11   Sleep apnea    Significant obstructive sleep apnea diagnosed in August, 2012, the patient is to receive CPAP    Stroke [facility])    "2 old strokes" CT and MRI. [facility] 11/11. diagnosis was dehydration, no acutal reports.    TSH elevation    on synthroid historically    Unsteady gait    August, 2012   SURGICAL HISTORY: Past Surgical History:  Procedure Laterality Date   BLADDER SURGERY     CATARACT EXTRACTION W/PHACO Left 05/11/2015   Procedure: CATARACT EXTRACTION PHACO AND INTRAOCULAR LENS PLACEMENT ([place]);  Surgeon: Leandrew Koyanagi, MD;  Location: [place];  Service: Ophthalmology;  Laterality: Left;  DIABETIC - oral meds, CPAP   COLONOSCOPY WITH PROPOFOL N/A 04/14/2019   Procedure: COLONOSCOPY WITH PROPOFOL;  Surgeon: [name], MD;  Location[facility] ENDOSCOPY;  Service: Endoscopy;  Laterality: N/A;   ESOPHAGOGASTRODUODENOSCOPY (EGD) WITH PROPOFOL N/A 07/01/2018   Procedure: ESOPHAGOGASTRODUODENOSCOPY (EGD) WITH PROPOFOL;  Surgeon: [name], MD;  Location: [facility] ENDOSCOPY;  Service: Endoscopy;  Laterality: N/A;   INSERT / REPLACE / [place]     "froze mass"   MECHANICAL AORTIC AND MITRAL VALVE REPLACEMENT  05/2018   [facility]     TONSILLECTOMY     VALVE REPLACEMENT     SOCIAL HISTORY: Social History   Socioeconomic History   Marital status: Married    Spouse name: Jasmine Awe   Number of children: 1   Years of education: 16   Highest education level: Bachelor's degree (e.g., BA, AB, BS)  Occupational History   Occupation: retired  Tobacco Use   Smoking status: Former    Types: Cigars, Cigarettes    Quit date: 10/02/1974    Years since quitting: 48.3   Smokeless tobacco: Never   Tobacco comments:    still smokes cigars once a day  Vaping Use   Vaping Use: Never used  Substance and Sexual Activity   Alcohol use: No   Drug use: No   Sexual activity: Yes  Other Topics Concern   Not on file  Social History Narrative   Married, retired, gets regular exercise.    Social Determinants of Health   Financial Resource Strain: Low Risk  (10/18/2022)   Overall Emergency planning/management officer Strain (  CARDIA)    Difficulty of Paying Living Expenses: Not hard at all  Food Insecurity: No Food Insecurity (10/18/2022)   Hunger Vital Sign    Worried About Running Out of Food in the Last Year: Never true    Ran Out of Food in the Last Year: Never true  Transportation Needs: No Transportation Needs (10/18/2022)   PRAPARE - Hydrologist (Medical): No    Lack of Transportation (Non-Medical): No  Physical Activity: Inactive (10/18/2022)   Exercise Vital Sign    Days of Exercise per Week: 0 days    Minutes of Exercise per Session: 0 min  Stress: No Stress Concern Present (10/18/2022)   [place]    Feeling of Stress : Not at all  Social Connections: Moderately Isolated (10/18/2022)   Social Connection and Isolation Panel [NHANES]    Frequency of Communication with Friends and Family: More than three times a week    Frequency of Social Gatherings with Friends and Family: More than three times a week    Attends Religious Services: Never    Building surveyor or Organizations: No    Attends Archivist Meetings: Never    Marital Status: Married  Human resources officer Violence: Not At Risk (10/18/2022)   Humiliation, Afraid, Rape, and Kick questionnaire    Fear of Current or Ex-Partner: No    Emotionally Abused: No    Physically Abused: No    Sexually Abused: No   FAMILY HISTORY: Family History  Problem Relation Age of Onset   Arrhythmia Father        A-Fib   Prostate cancer Father    Stroke Father    Breast cancer Mother    Arrhythmia Brother        A-Fib   Heart attack Neg Hx    Hypertension Neg Hx    ALLERGIES:  is allergic to macrolides and ketolides, mycinettes, nitrofuran derivatives, nitrofurantoin, penicillins, erythromycin, zofran [ondansetron hcl-dextrose], and zofran [ondansetron hcl].  MEDICATIONS:  Current Outpatient Medications  Medication Sig Dispense Refill   alendronate (FOSAMAX) 70 MG tablet TAKE 1 TABLET BY MOUTH ONCE A WEEK WITH  A  FULL  GLASS  OF  WATER  ON  AN  EMPTY  STOMACH 12 tablet 1   aspirin EC 81 MG tablet Take 1 tablet (81 mg total) by mouth daily.     atorvastatin (LIPITOR) 40 MG tablet TAKE 1 TABLET BY MOUTH ONCE DAILY . APPOINTMENT REQUIRED FOR FUTURE REFILLS 90 tablet 0   latanoprost (XALATAN) 0.005 % ophthalmic solution Place 1 drop into both eyes at bedtime.      levothyroxine (SYNTHROID) 75 MCG tablet Take 1 tablet (75 mcg total) by mouth daily. 90 tablet 1   memantine (NAMENDA) 5 MG tablet Take 1 tablet (5 mg total) by mouth at bedtime. 90 tablet 1   metoprolol tartrate (LOPRESSOR) 25 MG tablet Take 1/2 (one-half) tablet by mouth twice daily 90 tablet 0   pantoprazole (PROTONIX) 40 MG tablet Take 1 tablet by mouth twice daily 180 tablet 3   warfarin (COUMADIN) 3 MG tablet TAKE 1 TO 1 & 1/2 (ONE TO ONE & ONE-HALF) TABLETS BY MOUTH ONCE DAILY AS DIRECTED 45 tablet 5   No current facility-administered medications for this visit.    PHYSICAL EXAMINATION: ECOG PERFORMANCE STATUS:  0 - Asymptomatic Vitals:   01/11/23 1442  BP: 128/61  Pulse: 70  Resp: 17  Temp: 98.3  F (36.8 C)  SpO2: 96%   Filed Weights   01/11/23 1442  Weight: 158 lb (71.7 kg)   Physical Exam Cardiovascular:     Rate and Rhythm: Normal rate and regular rhythm.  Pulmonary:     Effort: No respiratory distress.  Abdominal:     General: There is no distension.     Palpations: Abdomen is soft.  Lymphadenopathy:     Cervical: No cervical adenopathy.  Skin:    General: Skin is warm.     Coloration: Skin is not pale.  Neurological:     Mental Status: He is alert. Mental status is at baseline.  Psychiatric:        Mood and Affect: Mood and affect normal.    LABORATORY DATA:  I have reviewed the data as listed Lab Results  Component Value Date   WBC 8.4 01/11/2023   HGB 12.7 (L) 01/11/2023   HCT 38.6 (L) 01/11/2023   MCV 94.8 01/11/2023   PLT 191 01/11/2023   Recent Labs    07/11/22 1341 08/16/22 1317 01/11/23 1356  NA 138  --  138  K 4.4  --  4.1  CL 102  --  97*  CO2 31  --  27  GLUCOSE 176*  --  218*  BUN 16  --  17  CREATININE 0.96  --  0.97  CALCIUM 9.2  --  9.2  GFRNONAA >60  --  >60  PROT 8.1 7.3 7.6  ALBUMIN 4.1 4.3 3.7  AST 35 34 35  ALT 30 20 18   ALKPHOS 93 104 97  BILITOT 0.9 0.7 1.1  BILIDIR  --  0.20  --    RADIOGRAPHIC STUDIES: I have personally reviewed the radiological images as listed and agreed with the findings in the report. CT CHEST WO CONTRAST  Result Date: 01/10/2023 CLINICAL DATA:  Lung nodule. EXAM: CT CHEST WITHOUT CONTRAST TECHNIQUE: Multidetector CT imaging of the chest was performed following the standard protocol without IV contrast. RADIATION DOSE REDUCTION: This exam was performed according to the departmental dose-optimization program which includes automated exposure control, adjustment of the mA and/or kV according to patient size and/or use of iterative reconstruction technique. COMPARISON:  PET-CT 01/01/2022. Older CT scan  12/13/2021 and older exams. FINDINGS: Cardiovascular: Left upper chest pacemaker with battery pack and leads extending along the right side of the heart. Patient is status post median sternotomy. Epicardial leads are in place. There is some enlargement of the left atrium of the heart. Prosthetic mitral valve and aortic valve. No significant pericardial effusion. Coronary artery calcifications are seen. On this non IV contrast exam the thoracic aorta overall has a normal course and caliber. Mediastinum/Nodes: Small thyroid gland. On this non IV contrast exam there is no specific abnormal lymph node enlargement identified in the axillary regions or hilum. There is an enlarged right paratracheal node. On series 2, image 55 this measures 2.1 x 1.6 cm. Previously 2.0 x 1.5 cm in March 2023. Additional enlarged node subcarinal has a short axis dimension on the prior of 13 mm and today 12 mm on series 2, image 72. By report this has been stable since at least 2022. Other nodes are also stable in the mediastinum. These also have been present since 2018. Preserved course and caliber of the thoracic esophagus. Lungs/Pleura: Tiny pleural effusions. Adjacent atelectasis. Some dependent atelectasis as well along the middle lobe. No pneumothorax or new consolidation. As seen previously there are some small lung nodules. This includes right  upper lobe on series 3, image 58 measuring 5 mm. This been present since February 2021. Minimal areas of pleural thickening identified such as along the middle lobe margin on series 3, image 18 which is also stable going back to February 2021. Ill-defined semi-solid nodular area again seen as well in the medial left upper lobe which in 2021 measured 9 mm and today measures 8 mm on series 3, image 70. This areas actually present going back to 2018 and smaller from that time demonstrating over 5 year stability. 4 mm lingular nodule seen on series 3 image 88 and 3 mm nodule left upper lobe on  image 73 are also stable for over 3 years. Upper Abdomen: In the upper abdomen the adrenal glands are preserved. There is atrophy of the pancreas. Nodular contours of the liver. There is a calcified lesion seen along the upper pole of the left kidney laterally, extending along the edge of the tail of the pancreas in the spleen which is stable in size. The level of calcification is slowly progressive. Again a benign lesion. Musculoskeletal: Mild degenerative changes of the spine. Osteopenia. There is severe compression of what appears to be the L1 level which is stable going back to the study of March 2023. Again there is displacement of the superior endplate towards the central canal causing some canal encroachment which is also stable. IMPRESSION: Multiple areas of lung nodularity identified. The solid nodules are stable for over 3 years in the semi-solid nodule in the left upper lobe has been present for over 5 years. No specific additional imaging follow-up of these nodules. Tiny pleural effusions with some adjacent opacities. Postop chest with prosthetic valves.  Pacemaker. Evidence of chronic liver disease with a nodular contours of the liver. Progressive calcification of the benign-appearing upper pole left-sided renal lesion. This also has been present since at least 2018. Stable enlarged mediastinal nodes. Stable compression of L1. Aortic Atherosclerosis (ICD10-I70.0). Electronically Signed   By: Jill Side M.D.   On: 01/10/2023 15:46   DG SWALLOW STUDY OP MEDICARE SPEECH PATH  Result Date: 12/20/2022 INDICATION: Oropharyngeal dysphagia. EXAM: MODIFIED BARIUM SWALLOW PROCEDURE: Different consistencies of barium were administered orally to the patient by the Speech Pathologist. Imaging of the pharynx was performed in the lateral projection. The radiology advanced practice provider was present in the fluoroscopy room for this study, providing personal supervision. FLUOROSCOPY TIME:  Radiation Exposure  Index (if provided by the fluoroscopic device): 6.30 mGy COMPARISON:  None. FINDINGS: Real-time fluoroscopy of the swallowing function was performed with a speech pathologist present. Multiple consistencies of barium were administered which included water, nectar, pudding, and Graham cracker. Silent laryngeal penetration and tracheal aspiration without a cough response with thin liquids. COMPLICATIONS: None. IMPRESSION: Modified barium swallow as described above. Please refer to the Speech Pathologists report for complete details and recommendations. This exam was performed by Tsosie Billing PA-C, and was supervised and interpreted by Dr. Quintella Baton. Electronically Signed   By: Valetta Mole M.D.   On: 12/20/2022 16:36     ASSESSMENT & PLAN:  No problem-specific Assessment & Plan notes found for this encounter.  # Mediastinal -Right paratracheal LN & right supraclavicular adenopathy 1-2 centimeters lymph node. [Since December 2018]. CT scan FEB 2023 [compared to Feb 2022]-left upper lobe- increasingly solid appearance of an area within geographic ground-glass changes- ? Slow-growing bronchogenic neoplasm. MARCH 2023-PET scan: Mixed attenuation anterior left upper lobe lesion of concern on recent diagnostic chest CT shows no substantial hypermetabolism; While reassuring,  low-grade or well differentiated neoplasm can be poorly FDG avid and continued close attention recommended; Stable mediastinal lymphadenopathy with low level FDG accumulation comparing back to 06/09/2019. Repeat CT chest wo contrast 01/09/23 continues to show stability and likely reflects benign etiology. No additional imaging recommended.    #History of A-fib on-Coumadin/valve- [Dr.Arida]-incidental left atrial thrombus noted on CT imaging. Stable.    # Anemia/Cirhosis- 13 ? Etiology [hx Bleeding ulcer]- STABLE [Dr.Wohl s/p scopes}. Hmg stable over past several years with hemoglobin 12.7. No microcytosis or other cytopenias. Monitor.    # RCC  s/p Right ablation-2001. August 2021-CT scan partial evaluation. Underwent surveillance cysto with Dr. Bernardo Heater 11/23 which was negative. No imaging recommended.    # Incidental findings on Imaging MARCH 2023- PET scan: Hepatic cirrhosis; Aortic atherosclerosis;.I reviewed/discussed/counseled the patient.   # Dementia- following expected disease course which we reviewed briefly today.    DISPOSITION: 6 months- lab (cbc, cmp, ldh), Dr Rogue Bussing  All questions were answered. The patient knows to call the clinic with any problems, questions or concerns.    Verlon Au, NP 01/11/2023

## 2023-01-11 NOTE — Progress Notes (Signed)
Patient here for oncology follow-up appointment, concerns of SOB and low appetite

## 2023-01-15 DIAGNOSIS — I7300 Raynaud's syndrome without gangrene: Secondary | ICD-10-CM | POA: Diagnosis not present

## 2023-01-15 DIAGNOSIS — F02A18 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, mild, with other behavioral disturbance: Secondary | ICD-10-CM | POA: Diagnosis not present

## 2023-01-15 DIAGNOSIS — E1142 Type 2 diabetes mellitus with diabetic polyneuropathy: Secondary | ICD-10-CM | POA: Diagnosis not present

## 2023-01-15 DIAGNOSIS — E1159 Type 2 diabetes mellitus with other circulatory complications: Secondary | ICD-10-CM | POA: Diagnosis not present

## 2023-01-15 DIAGNOSIS — Z7901 Long term (current) use of anticoagulants: Secondary | ICD-10-CM | POA: Diagnosis not present

## 2023-01-15 DIAGNOSIS — E039 Hypothyroidism, unspecified: Secondary | ICD-10-CM | POA: Diagnosis not present

## 2023-01-15 DIAGNOSIS — E7800 Pure hypercholesterolemia, unspecified: Secondary | ICD-10-CM | POA: Diagnosis not present

## 2023-01-15 DIAGNOSIS — Z7982 Long term (current) use of aspirin: Secondary | ICD-10-CM | POA: Diagnosis not present

## 2023-01-15 DIAGNOSIS — Z9181 History of falling: Secondary | ICD-10-CM | POA: Diagnosis not present

## 2023-01-15 DIAGNOSIS — Z7983 Long term (current) use of bisphosphonates: Secondary | ICD-10-CM | POA: Diagnosis not present

## 2023-01-15 DIAGNOSIS — I152 Hypertension secondary to endocrine disorders: Secondary | ICD-10-CM | POA: Diagnosis not present

## 2023-01-19 ENCOUNTER — Other Ambulatory Visit: Payer: Self-pay | Admitting: Family Medicine

## 2023-01-23 ENCOUNTER — Ambulatory Visit: Payer: PPO | Attending: Cardiovascular Disease

## 2023-01-23 DIAGNOSIS — I482 Chronic atrial fibrillation, unspecified: Secondary | ICD-10-CM

## 2023-01-23 DIAGNOSIS — I4892 Unspecified atrial flutter: Secondary | ICD-10-CM

## 2023-01-23 DIAGNOSIS — Z5181 Encounter for therapeutic drug level monitoring: Secondary | ICD-10-CM

## 2023-01-23 DIAGNOSIS — I4891 Unspecified atrial fibrillation: Secondary | ICD-10-CM

## 2023-01-23 LAB — POCT INR: INR: 3.5 — AB (ref 2.0–3.0)

## 2023-01-23 NOTE — Patient Instructions (Signed)
Description   CONTINUE 1 tablet every day EXCEPT 1.5 tablets on TUESDAYS AND THURSDAYS.  Recheck INR in 5 weeks.

## 2023-01-24 DIAGNOSIS — Z9181 History of falling: Secondary | ICD-10-CM | POA: Diagnosis not present

## 2023-01-24 DIAGNOSIS — I152 Hypertension secondary to endocrine disorders: Secondary | ICD-10-CM | POA: Diagnosis not present

## 2023-01-24 DIAGNOSIS — Z7982 Long term (current) use of aspirin: Secondary | ICD-10-CM | POA: Diagnosis not present

## 2023-01-24 DIAGNOSIS — E039 Hypothyroidism, unspecified: Secondary | ICD-10-CM | POA: Diagnosis not present

## 2023-01-24 DIAGNOSIS — F02A18 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, mild, with other behavioral disturbance: Secondary | ICD-10-CM | POA: Diagnosis not present

## 2023-01-24 DIAGNOSIS — E1159 Type 2 diabetes mellitus with other circulatory complications: Secondary | ICD-10-CM | POA: Diagnosis not present

## 2023-01-24 DIAGNOSIS — Z7901 Long term (current) use of anticoagulants: Secondary | ICD-10-CM | POA: Diagnosis not present

## 2023-01-24 DIAGNOSIS — Z7983 Long term (current) use of bisphosphonates: Secondary | ICD-10-CM | POA: Diagnosis not present

## 2023-01-24 DIAGNOSIS — E1142 Type 2 diabetes mellitus with diabetic polyneuropathy: Secondary | ICD-10-CM | POA: Diagnosis not present

## 2023-01-24 DIAGNOSIS — E7800 Pure hypercholesterolemia, unspecified: Secondary | ICD-10-CM | POA: Diagnosis not present

## 2023-01-24 DIAGNOSIS — I7300 Raynaud's syndrome without gangrene: Secondary | ICD-10-CM | POA: Diagnosis not present

## 2023-01-29 ENCOUNTER — Telehealth: Payer: Self-pay

## 2023-01-29 DIAGNOSIS — R42 Dizziness and giddiness: Secondary | ICD-10-CM | POA: Diagnosis not present

## 2023-01-29 DIAGNOSIS — F0390 Unspecified dementia without behavioral disturbance: Secondary | ICD-10-CM | POA: Diagnosis not present

## 2023-01-29 DIAGNOSIS — R531 Weakness: Secondary | ICD-10-CM | POA: Diagnosis not present

## 2023-01-29 DIAGNOSIS — D692 Other nonthrombocytopenic purpura: Secondary | ICD-10-CM | POA: Diagnosis not present

## 2023-01-29 DIAGNOSIS — R410 Disorientation, unspecified: Secondary | ICD-10-CM | POA: Diagnosis not present

## 2023-01-29 DIAGNOSIS — R443 Hallucinations, unspecified: Secondary | ICD-10-CM | POA: Diagnosis not present

## 2023-01-29 NOTE — Telephone Encounter (Signed)
Copied from CRM [id number]. Topic: General - Other >> Jan 29, 2023 12:30 PM Santiya F wrote: Reason for CRM: [facility] is calling on behalf of pt. [facility] says pt's wife called yesterday and said that pt was having hallucinations and dizziness. [facility] went out and to the pt's home and evaluated pt and is calling to report the findings. Pt was dehydrated and [facility] says pt should come in for labs just to make sure everything is okay. Please follow up with [facility] at - [phone]

## 2023-01-29 NOTE — Telephone Encounter (Signed)
Ok please offer appt for me or another provider to evaluate him within the next 2 days

## 2023-01-31 ENCOUNTER — Ambulatory Visit (INDEPENDENT_AMBULATORY_CARE_PROVIDER_SITE_OTHER): Payer: PPO | Admitting: Family Medicine

## 2023-01-31 ENCOUNTER — Encounter: Payer: Self-pay | Admitting: Family Medicine

## 2023-01-31 ENCOUNTER — Telehealth: Payer: Self-pay | Admitting: Family Medicine

## 2023-01-31 VITALS — BP 118/53 | HR 72 | Wt 155.0 lb

## 2023-01-31 DIAGNOSIS — K7460 Unspecified cirrhosis of liver: Secondary | ICD-10-CM

## 2023-01-31 DIAGNOSIS — R270 Ataxia, unspecified: Secondary | ICD-10-CM | POA: Diagnosis not present

## 2023-01-31 DIAGNOSIS — E039 Hypothyroidism, unspecified: Secondary | ICD-10-CM | POA: Diagnosis not present

## 2023-01-31 DIAGNOSIS — F02A 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, mild, without behavioral disturbance, psychotic disturbance, mood disturbance, and anxiety: Secondary | ICD-10-CM

## 2023-01-31 DIAGNOSIS — R410 Disorientation, unspecified: Secondary | ICD-10-CM

## 2023-01-31 DIAGNOSIS — E1142 Type 2 diabetes mellitus with diabetic polyneuropathy: Secondary | ICD-10-CM | POA: Diagnosis not present

## 2023-01-31 LAB — POCT URINALYSIS DIPSTICK
Bilirubin, UA: NEGATIVE
Blood, UA: NEGATIVE
Glucose, UA: NEGATIVE
Ketones, UA: NEGATIVE
Leukocytes, UA: NEGATIVE
Nitrite, UA: NEGATIVE
Protein, UA: NEGATIVE
Spec Grav, UA: 1.01 (ref 1.010–1.025)
Urobilinogen, UA: 0.2 E.U./dL
pH, UA: 7 (ref 5.0–8.0)

## 2023-01-31 NOTE — Telephone Encounter (Signed)
Home Health Verbal Orders - Caller/Agency: Wilkie Aye with Hospice   Callback Number: [phone]  They are asking if Dr. Leonard Schwartz will be the hospice attending provider. They also are asking if Dr. B wants to sign orders and do comfort care or have the hospice provider do this.  Please advise

## 2023-01-31 NOTE — Progress Notes (Signed)
Acute Office Visit  Subjective:     Patient ID: [name] [name], male    DOB: 06-Jul-1934, 89 y.o.   MRN: [id number]  Chief Complaint  Patient presents with   Memory Loss    HPI Patient is in today for concerns for acute confusion and difficulty walking, with his wife and stepdaughter. Went to [employer] with family this past weekend.  CPAP was not working on Sunday night.  Woke up Monday with shortness of breath that improved with sitting up. Wife notes that he felt cold on Monday morning.  For the rest of that day he had visual hallucinations that persisted longer than normal, increased confusion, and difficulty walking or rising from chairs.   Seen that night  by [facility], suspected dehydration and gave fluids.   Since Monday has been at his cognitive baseline, but his difficulty walking and moving have persisted.  Wife and stepdaughter deny any trauma or falls.   Wife and stepdaughter would also like to discuss options for greater home support as wife has difficulty safely moving him and keeping up with his growing needs. He is seen at home by [facility] respite, PT, OT, and speech therapy. They do not have home health aids.     Review of Systems  Constitutional:  Negative for fever.  Respiratory:  Negative for shortness of breath.   Cardiovascular:  Negative for chest pain.  Gastrointestinal: Negative.  Negative for abdominal pain, blood in stool, constipation, diarrhea, melena, nausea and vomiting.  Musculoskeletal:  Negative for falls.  Skin:  Negative for itching and rash.  Neurological:  Positive for weakness. Negative for sensory change, speech change and loss of consciousness.        Objective:    BP (!) 118/53 (BP Location: Left Arm, Patient Position: Sitting, Cuff Size: Normal)   Pulse 72   Wt 155 lb (70.3 kg)   SpO2 100%   BMI 22.89 kg/m    Physical Exam Constitutional:      General: He is not in acute distress. HENT:     Head: Normocephalic and  atraumatic.     Right Ear: External ear normal.     Left Ear: External ear normal.     Mouth/Throat:     Mouth: Mucous membranes are moist.     Pharynx: Oropharynx is clear. No oropharyngeal exudate or posterior oropharyngeal erythema.     Comments: Lips: cyanotic  Eyes:     Extraocular Movements: Extraocular movements intact.     Conjunctiva/sclera: Conjunctivae normal.     Pupils: Pupils are equal, round, and reactive to light.  Cardiovascular:     Pulses: Normal pulses.     Heart sounds: Normal heart sounds.  Pulmonary:     Effort: Pulmonary effort is normal.     Breath sounds: Normal breath sounds.  Abdominal:     General: Abdomen is flat. Bowel sounds are normal. There is no distension.     Palpations: Abdomen is soft. There is no mass.     Tenderness: There is no abdominal tenderness.  Musculoskeletal:        General: Normal range of motion.     Cervical back: Normal range of motion and neck supple.     Right lower leg: No edema.     Left lower leg: No edema.  Skin:    General: Skin is warm and dry.     Capillary Refill: Capillary refill takes less than 2 seconds.     Nails: There is  no clubbing.     Comments: Bilateral lower legs: erythematous and dry flaking skin    Neurological:     Mental Status: Mental status is at baseline.     Cranial Nerves: No cranial nerve deficit, dysarthria or facial asymmetry.     Sensory: Sensation is intact.     Motor: No tremor, abnormal muscle tone or pronator drift.     Coordination: Finger-Nose-Finger Test normal.     Results for orders placed or performed in visit on 01/31/23  POCT Urinalysis Dipstick  Result Value Ref Range   Color, UA yellow    Clarity, UA     Glucose, UA Negative Negative   Bilirubin, UA Negative    Ketones, UA Negative    Spec Grav, UA 1.010 1.010 - 1.025   Blood, UA Negative    pH, UA 7.0 5.0 - 8.0   Protein, UA Negative Negative   Urobilinogen, UA 0.2 0.2 or 1.0 E.U./dL   Nitrite, UA negative     Leukocytes, UA Negative Negative   Appearance     Odor         01/31/2023   10:01 AM 03/27/2022    3:26 PM 11/15/2021    3:32 PM  MMSE - Mini Mental State Exam  Orientation to time Orientation to Place Registration Attention/ Calculation Recall 0 3 3  Language- name 2 objects Language- repeat 0 1 1  Language- follow 3 step command Language- read & follow direction Write a sentence Copy design Total score Assessment & Plan:   1. Delirium No signs of acute delirium at today's visit. Previous weekend's episode most likely due to hypoxia with non-functioning CPAP the night before onset.  Also possibly due to dehydration, progression of cirrhosis, or hyperthyroidism from over treatment with levothyroxine given previous elevated TSH.  UTI ruled out by negative UA. Further eval/management pending lab results Be sure to use CPAP regularly  Ordered: - POCT Urinalysis Dipstick - Ambulatory referral to Home Health - Comprehensive metabolic panel - CBC - TSH - Ambulatory referral to Hospice  2. Ataxia Most likely due to dementia progression and delirium episode.   Is unsteady at home and wife has difficulty moving him.  Referrals placed to home health and hospice for increased home support.  - Ambulatory referral to Home Health - Ambulatory referral to Hospice  3. 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, mild, without behavioral disturbance, psychotic disturbance, mood disturbance, and anxiety MMSE shows dementia progression at today's visit.  Referrals placed to home health and hospice for increased home support.  - Ambulatory referral to Home Health - Ambulatory referral to Hospice  4. Adult hypothyroidism Chronic and elevated TSH at last visit. Possibly contributed to previous delirium  Ordered TSH   - TSH  5. Cirrhosis of liver without ascites, unspecified hepatic cirrhosis  type Chronic and followed by GI.  Possibly contributed to previous delirium. Ordered CMP and CBC.   - Comprehensive metabolic panel - CBC - Ambulatory referral to Hospice  6. Type 2 diabetes mellitus with diabetic polyneuropathy, without long-term current use of insulin Chronic and A1c previously stable and well controlled  Due for recheck of A1c at today's visit.  - Hemoglobin A1c   Patient and family advised to  contact office if symptoms return or worsen.    No follow-ups on file.  Gilmer Mor, Medical Student   Patient seen along with MS3 student Ezekiel Slocumb. I personally evaluated this patient along with the student, and verified all aspects of the history, physical exam, and medical decision making as documented by the student. I agree with the student's documentation and have made all necessary edits.  Ayeden Gladman, Marzella Schlein, MD, MPH [facility]

## 2023-02-01 LAB — COMPREHENSIVE METABOLIC PANEL
ALT: 17 IU/L (ref 0–44)
AST: 26 IU/L (ref 0–40)
Albumin/Globulin Ratio: 1.3 (ref 1.2–2.2)
Albumin: 4 g/dL (ref 3.7–4.7)
Alkaline Phosphatase: 119 IU/L (ref 44–121)
BUN/Creatinine Ratio: 15 (ref 10–24)
BUN: 16 mg/dL (ref 8–27)
Bilirubin Total: 0.5 mg/dL (ref 0.0–1.2)
CO2: 25 mmol/L (ref 20–29)
Calcium: 9.3 mg/dL (ref 8.6–10.2)
Chloride: 98 mmol/L (ref 96–106)
Creatinine, Ser: 1.05 mg/dL (ref 0.76–1.27)
Globulin, Total: 3.2 g/dL (ref 1.5–4.5)
Glucose: 281 mg/dL — ABNORMAL HIGH (ref 70–99)
Potassium: 4.7 mmol/L (ref 3.5–5.2)
Sodium: 138 mmol/L (ref 134–144)
Total Protein: 7.2 g/dL (ref 6.0–8.5)
eGFR: 69 mL/min/{1.73_m2} (ref >59)

## 2023-02-01 LAB — CBC
Hematocrit: 38.7 % (ref 37.5–51.0)
Hemoglobin: 12.3 g/dL — ABNORMAL LOW (ref 13.0–17.7)
MCH: 30.4 pg (ref 26.6–33.0)
MCHC: 31.8 g/dL (ref 31.5–35.7)
MCV: 96 fL (ref 79–97)
Platelets: 206 10*3/uL (ref 150–450)
RBC: 4.04 x10E6/uL — ABNORMAL LOW (ref 4.14–5.80)
RDW: 12.1 % (ref 11.6–15.4)
WBC: 8.1 10*3/uL (ref 3.4–10.8)

## 2023-02-01 LAB — HEMOGLOBIN A1C
Est. average glucose Bld gHb Est-mCnc: 154 mg/dL
Hgb A1c MFr Bld: 7 % — ABNORMAL HIGH (ref 4.8–5.6)

## 2023-02-01 LAB — TSH: TSH: 2.99 u[IU]/mL (ref 0.450–4.500)

## 2023-02-01 NOTE — Telephone Encounter (Signed)
OK for verbals - I will remain as hospice attending and sign orders as needed

## 2023-02-01 NOTE — Telephone Encounter (Signed)
Orders given.  

## 2023-02-05 DIAGNOSIS — Z950 Presence of cardiac pacemaker: Secondary | ICD-10-CM | POA: Diagnosis not present

## 2023-02-08 ENCOUNTER — Telehealth: Payer: Self-pay | Admitting: Cardiovascular Disease

## 2023-02-08 NOTE — Telephone Encounter (Signed)
LVM that will cancel patient appt. And that he does not need to come in once on hospice if she feels it is too taxing.  Regarding the docotr seeing him in the hospital, will depend if he is there at the time of patient admit and case by case.  If further questions please call office on Monday.

## 2023-02-08 NOTE — Telephone Encounter (Signed)
Patient's wife is calling stating the patient is now on hospice care. She is wanting to know if the patient still needs to keep his appt for 04/30 due to this. She states they are coming to the home, this was advised by the PCP they felt it best to get him I early. She request a VM be left if she does not answer callback. She is also wondering if pt's on hospice still go to the hospital, if so she would still want Dr. Kirke Corin to see the patient there if possible even if the appointment is no longer needed. Please advise.

## 2023-02-12 ENCOUNTER — Ambulatory Visit: Payer: PPO | Admitting: Cardiovascular Disease

## 2023-02-21 ENCOUNTER — Other Ambulatory Visit: Payer: Self-pay

## 2023-02-21 DIAGNOSIS — K7460 Unspecified cirrhosis of liver: Secondary | ICD-10-CM

## 2023-02-22 ENCOUNTER — Other Ambulatory Visit: Payer: Self-pay | Admitting: Family Medicine

## 2023-02-22 DIAGNOSIS — M8008XA Age-related osteoporosis with current pathological fracture, vertebra(e), initial encounter for fracture: Secondary | ICD-10-CM

## 2023-02-22 NOTE — Telephone Encounter (Signed)
Requested Prescriptions  Refused Prescriptions Disp Refills   alendronate (FOSAMAX) 70 MG tablet [Pharmacy Med Name: ALENDRONATE SODIUM 70 MG TAB] 12 tablet 1    Sig: TAKE 1 TABLET EVERY 7 DAYS WITH A FULL GLASS OF WATER ON AN EMPTY STOMACH DO NOT LIE DOWN FOR AT LEAST 30 MIN     Endocrinology:  Bisphosphonates Failed - 02/22/2023  5:31 PM      Failed - Vitamin D in normal range and within 360 days    No results found for: "YQ6578IO9", "GE9528UX3", "[id number]", "25OHVITD3", "25OHVITD2", "25OHVITD1", "VD25OH"       Failed - Mg Level in normal range and within 360 days    Magnesium  Date Value Ref Range Status  12/01/2018 2.1 1.7 - 2.4 mg/dL Final    Comment:    Performed at [facility], [address] [place], [place] [address]         Failed - Phosphate in normal range and within 360 days    Phosphorus  Date Value Ref Range Status  12/01/2018 3.7 2.5 - 4.6 mg/dL Final    Comment:    Performed at [facility], [address] [place], [place] [address]         Passed - Ca in normal range and within 360 days    Calcium  Date Value Ref Range Status  01/31/2023 9.3 8.6 - 10.2 mg/dL Final         Passed - Cr in normal range and within 360 days    Creatinine, Ser  Date Value Ref Range Status  01/31/2023 1.05 0.76 - 1.27 mg/dL Final         Passed - eGFR is 30 or above and within 360 days    GFR calc Af Amer  Date Value Ref Range Status  10/25/2020 81 >59 mL/min/1.73 Final    Comment:    **In accordance with recommendations from the NKF-ASN Task force,**   [facility] is in the process of updating its eGFR calculation to the   2021 CKD-EPI creatinine equation that estimates kidney function   without a race variable.    GFR, Estimated  Date Value Ref Range Status  01/11/2023 >60 >60 mL/min Final    Comment:    (NOTE) Calculated using the CKD-EPI Creatinine Equation (2021)    eGFR  Date Value Ref Range Status  01/31/2023 69 >59 mL/min/1.73 Final          Passed - Valid encounter within last 12 months    Recent Outpatient Visits           3 weeks ago Delirium   [facility] [place], Marzella Schlein, MD   3 months ago Type 2 diabetes mellitus with diabetic polyneuropathy, without long-term current use of insulin [facility])   [place] [facility] [place], Marzella Schlein, MD   6 months ago Type 2 diabetes mellitus with diabetic polyneuropathy, without long-term current use of insulin [facility])   [place] [facility] [place], Marzella Schlein, MD   7 months ago Need for immunization against influenza   [facility] Ronnald Ramp, MD   11 months ago Essential (primary) hypertension    [facility] Bosie Clos, MD              Passed - Bone Mineral Density or Dexa Scan completed in the last 2 years

## 2023-02-27 ENCOUNTER — Ambulatory Visit: Payer: PPO | Attending: Cardiovascular Disease

## 2023-02-27 DIAGNOSIS — Z5181 Encounter for therapeutic drug level monitoring: Secondary | ICD-10-CM

## 2023-02-27 DIAGNOSIS — I4891 Unspecified atrial fibrillation: Secondary | ICD-10-CM

## 2023-02-27 DIAGNOSIS — I4892 Unspecified atrial flutter: Secondary | ICD-10-CM | POA: Diagnosis not present

## 2023-02-27 DIAGNOSIS — I482 Chronic atrial fibrillation, unspecified: Secondary | ICD-10-CM | POA: Diagnosis not present

## 2023-02-27 LAB — POCT INR: INR: 3.7 — AB (ref 2.0–3.0)

## 2023-02-27 NOTE — Patient Instructions (Signed)
CONTINUE 1 tablet every day EXCEPT 1.5 tablets on TUESDAYS AND THURSDAYS.  Recheck INR in 4 weeks. [facility] TON[facility]  [phone]

## 2023-03-16 ENCOUNTER — Other Ambulatory Visit: Payer: Self-pay | Admitting: Cardiovascular Disease

## 2023-03-18 NOTE — Telephone Encounter (Signed)
Patient on Hospice.  How to proceed with refill?  Thanks!

## 2023-03-27 ENCOUNTER — Ambulatory Visit: Payer: PPO | Attending: Cardiovascular Disease

## 2023-03-27 DIAGNOSIS — Z5181 Encounter for therapeutic drug level monitoring: Secondary | ICD-10-CM

## 2023-03-27 DIAGNOSIS — I4891 Unspecified atrial fibrillation: Secondary | ICD-10-CM | POA: Diagnosis not present

## 2023-03-27 DIAGNOSIS — I482 Chronic atrial fibrillation, unspecified: Secondary | ICD-10-CM

## 2023-03-27 DIAGNOSIS — I4892 Unspecified atrial flutter: Secondary | ICD-10-CM

## 2023-03-27 LAB — POCT INR: INR: 5.4 — AB (ref 2.0–3.0)

## 2023-03-27 NOTE — Patient Instructions (Signed)
Description   Skip today and tomorrow's dosage of Warfarin, then start taking 1 tablet every day except 1.5 tablets on Thursdays.  Recheck INR in 2 weeks. [facility]ON[facility]  [phone]

## 2023-04-09 ENCOUNTER — Other Ambulatory Visit: Payer: Self-pay | Admitting: Cardiovascular Disease

## 2023-04-10 ENCOUNTER — Ambulatory Visit: Payer: PPO | Attending: Cardiovascular Disease

## 2023-04-10 DIAGNOSIS — I4892 Unspecified atrial flutter: Secondary | ICD-10-CM | POA: Diagnosis not present

## 2023-04-10 DIAGNOSIS — Z5181 Encounter for therapeutic drug level monitoring: Secondary | ICD-10-CM | POA: Diagnosis not present

## 2023-04-10 DIAGNOSIS — I4891 Unspecified atrial fibrillation: Secondary | ICD-10-CM

## 2023-04-10 DIAGNOSIS — I482 Chronic atrial fibrillation, unspecified: Secondary | ICD-10-CM | POA: Diagnosis not present

## 2023-04-10 LAB — POCT INR: INR: 4.4 — AB (ref 2.0–3.0)

## 2023-04-10 NOTE — Telephone Encounter (Signed)
Refill request

## 2023-04-10 NOTE — Patient Instructions (Signed)
Skip today then start taking 1 tablet every day.  Recheck INR in 2 weeks.   [phone]

## 2023-04-10 NOTE — Telephone Encounter (Signed)
Refill request for warfarin:  Last INR was 5.4 on 03/27/23 Next INR due 04/10/23 LOV was 05/15/22  (Last appt was cancelled per Dr Kirke Corin because pt has been put on hospice.  Stated pt did not need to be seen)  Refill approved.

## 2023-04-22 ENCOUNTER — Telehealth: Payer: Self-pay

## 2023-04-22 ENCOUNTER — Other Ambulatory Visit: Payer: Self-pay | Admitting: Family Medicine

## 2023-04-22 NOTE — Telephone Encounter (Signed)
Copied from CRM [id number]. Topic: General - Other >> Apr 22, 2023 12:57 PM Ja-Kwan M wrote: Reason for CRM: [name] with [facility] requests test order for Warfarin to be faxed to [phone]. Cb# [phone]

## 2023-04-22 NOTE — Telephone Encounter (Signed)
Please fax INR order to them. Thanks

## 2023-04-23 NOTE — Telephone Encounter (Signed)
They need written order for INR order. Range and frequency. Please advise.

## 2023-04-24 ENCOUNTER — Ambulatory Visit: Payer: PPO

## 2023-04-24 ENCOUNTER — Telehealth: Payer: Self-pay

## 2023-04-24 ENCOUNTER — Telehealth: Payer: Self-pay | Admitting: Family Medicine

## 2023-04-24 DIAGNOSIS — Z952 Presence of prosthetic heart valve: Secondary | ICD-10-CM

## 2023-04-24 MED ORDER — WARFARIN SODIUM 2.5 MG PO TABS
2.5000 mg | ORAL_TABLET | ORAL | 0 refills | Status: DC
Start: 2023-04-25 — End: 2023-05-08

## 2023-04-24 NOTE — Telephone Encounter (Signed)
Mech aortic and mech mitral valve with goal INR of 2.5-3.5 04/10/23 INR 4.4 - coumadin decreased to 3mg  daily from 3mg  SunMTWFSat with 4.5 mg on Thursday.  04/24/23 INR 3.6 - coumadin currently 3 mg daily; decreasing to 3mg  every day except Thursday with 2.5 mg dose on Thursdays. Recheck in two weeks

## 2023-04-24 NOTE — Telephone Encounter (Signed)
I called and spoke to patient's wife in regards to INR managing.  I mentioned that since he is now under the care of his PCP and [facility], the Warfarin would now be managed by them.  [name] checked INR this morning and it was 3.6 and she was reaching out to PCP for advisement (see phone notes).  I told his wife to call if they need further assistance.

## 2023-04-24 NOTE — Telephone Encounter (Signed)
Handled in separate telephone encounter.

## 2023-04-24 NOTE — Telephone Encounter (Signed)
Patient's wife called stating [name] [name] is under hospice care and they check is INR at home this morning at it was 3.6.

## 2023-04-24 NOTE — Telephone Encounter (Signed)
[name] advised- INR goal 2.5-3.5. last INR check was on 04/10/23  INR was 4.4 Patient advised to hold warfarin 3mg  x 1 day then continue warfarin 3 mg daily. Fu in 2 weeks.  [name] will check INR today and will call back with result.  [name] took verbal order for INR check.

## 2023-04-24 NOTE — Telephone Encounter (Signed)
[name] [name] from [facility] called back to see if she can do the INR check today. Please send order or contact her at [phone].

## 2023-04-24 NOTE — Telephone Encounter (Signed)
[name] calling back with results  INR -3.6 / PT 35.1 pt currently taking 3 mg tablet once daily.   [name] requesting how to processed with dosage [phone]

## 2023-05-06 ENCOUNTER — Other Ambulatory Visit: Payer: Self-pay | Admitting: Family Medicine

## 2023-05-06 DIAGNOSIS — E039 Hypothyroidism, unspecified: Secondary | ICD-10-CM

## 2023-05-07 DIAGNOSIS — Z950 Presence of cardiac pacemaker: Secondary | ICD-10-CM | POA: Diagnosis not present

## 2023-05-07 DIAGNOSIS — I442 Atrioventricular block, complete: Secondary | ICD-10-CM | POA: Diagnosis not present

## 2023-05-07 DIAGNOSIS — R011 Cardiac murmur, unspecified: Secondary | ICD-10-CM | POA: Diagnosis not present

## 2023-05-07 DIAGNOSIS — I10 Essential (primary) hypertension: Secondary | ICD-10-CM | POA: Diagnosis not present

## 2023-05-07 DIAGNOSIS — I4891 Unspecified atrial fibrillation: Secondary | ICD-10-CM | POA: Diagnosis not present

## 2023-05-07 DIAGNOSIS — Z9889 Other specified postprocedural states: Secondary | ICD-10-CM | POA: Diagnosis not present

## 2023-05-07 DIAGNOSIS — R42 Dizziness and giddiness: Secondary | ICD-10-CM | POA: Diagnosis not present

## 2023-05-07 DIAGNOSIS — Z7901 Long term (current) use of anticoagulants: Secondary | ICD-10-CM | POA: Diagnosis not present

## 2023-05-07 DIAGNOSIS — R5383 Other fatigue: Secondary | ICD-10-CM | POA: Diagnosis not present

## 2023-05-07 DIAGNOSIS — I639 Cerebral infarction, unspecified: Secondary | ICD-10-CM | POA: Diagnosis not present

## 2023-05-07 DIAGNOSIS — I2510 Atherosclerotic heart disease of native coronary artery without angina pectoris: Secondary | ICD-10-CM | POA: Diagnosis not present

## 2023-05-07 DIAGNOSIS — Z45018 Encounter for adjustment and management of other part of cardiac pacemaker: Secondary | ICD-10-CM | POA: Diagnosis not present

## 2023-05-08 ENCOUNTER — Telehealth: Payer: Self-pay | Admitting: Family Medicine

## 2023-05-08 ENCOUNTER — Other Ambulatory Visit: Payer: Self-pay | Admitting: Family Medicine

## 2023-05-08 DIAGNOSIS — Z952 Presence of prosthetic heart valve: Secondary | ICD-10-CM

## 2023-05-08 MED ORDER — WARFARIN SODIUM 3 MG PO TABS: ORAL_TABLET | ORAL | 0 refills | Status: DC

## 2023-05-08 MED ORDER — WARFARIN SODIUM 2.5 MG PO TABS: 2.5000 mg | ORAL_TABLET | ORAL | 0 refills | Status: DC

## 2023-05-08 NOTE — Telephone Encounter (Signed)
Cesalea from [facility] called stated that she took the patients PT INR 3.2 and patient will be going to Hospice for 5 days for Respite Stay. Cesalea is needing call back orders. Please f/u with Cesalea

## 2023-05-08 NOTE — Telephone Encounter (Signed)
Ceselea is calling in again checking the status of verbal orders regarding the pt's dosing of Warfarin. Ceselea says that pt's wife said there had been a medication error with [facility]ter that she just notified Ceselea of. Ceselea says the orders can be done by another provider as she has spoken with Jacquenette Shone regarding the pt once before in Dr. Senaida Lange absence. Please follow up with Ceselea- [phone].

## 2023-05-09 NOTE — Telephone Encounter (Signed)
See other phone note

## 2023-05-09 NOTE — Telephone Encounter (Signed)
Left detailed message for Cesalea advising as below.

## 2023-05-09 NOTE — Telephone Encounter (Signed)
That INR is at goal for him. Continue current coumadin dose and recheck in 1 month.

## 2023-05-14 ENCOUNTER — Encounter: Payer: Self-pay | Admitting: Family Medicine

## 2023-05-15 DIAGNOSIS — G4733 Obstructive sleep apnea (adult) (pediatric): Secondary | ICD-10-CM | POA: Diagnosis not present

## 2023-05-20 ENCOUNTER — Telehealth: Payer: Self-pay

## 2023-05-20 NOTE — Telephone Encounter (Signed)
Copied from CRM [id number]. Topic: General - Deceased Patient >> 05/31/2023  2:51 PM Phill Myron wrote: Company: [employer] in [place] Death Certificate is on the [place] [employer] system and It is waiting you information and signature Case ID number # [id number]

## 2023-05-21 NOTE — Telephone Encounter (Signed)
Completed and submitted

## 2023-05-21 NOTE — Telephone Encounter (Signed)
Cathy at [employer] [facility] [place] notified.

## 2023-06-16 DEATH — deceased

## 2023-07-15 ENCOUNTER — Other Ambulatory Visit: Payer: PPO

## 2023-07-15 ENCOUNTER — Ambulatory Visit: Payer: PPO | Admitting: Internal Medicine

## 2023-08-26 ENCOUNTER — Other Ambulatory Visit: Payer: PPO | Admitting: Urology

## 2023-09-16 IMAGING — PT NM PET TUM IMG INITIAL (PI) SKULL BASE T - THIGH
7 series · 25 of 25 positions shown · non-contrast
Comparison: Chest CT 12/13/2021.  PET-CT 06/09/2019

CLINICAL DATA: Initial treatment strategy for left upper lobe lung
lesion.

EXAM:
NUCLEAR MEDICINE PET SKULL BASE TO THIGH
TECHNIQUE: 8.1 mCi F-18 FDG was injected intravenously. Full-ring PET imaging
was performed from the skull base to thigh after the radiotracer. CT
data was obtained and used for attenuation correction and anatomic
localization.
Fasting blood glucose: 121 mg/dl

[Series 3: pet sk_thigh ac · axial · 5.0mm · 4.07mm/px · z∈[-1222,-366]mm · 5 of 215 slices shown]
[im 1/215]
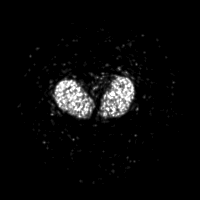
[im 54/215]
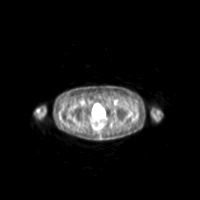
[im 108/215]
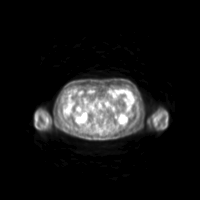
[im 161/215]
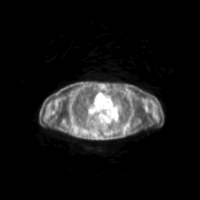
[im 215/215]
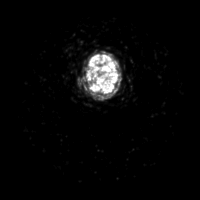

[Series 4: ct sk_thigh 5.0 bf37 · axial · 5.0mm · 0.98mm/px · z∈[-1222,-366]mm · 5 of 215 slices shown]
[im 1/215]
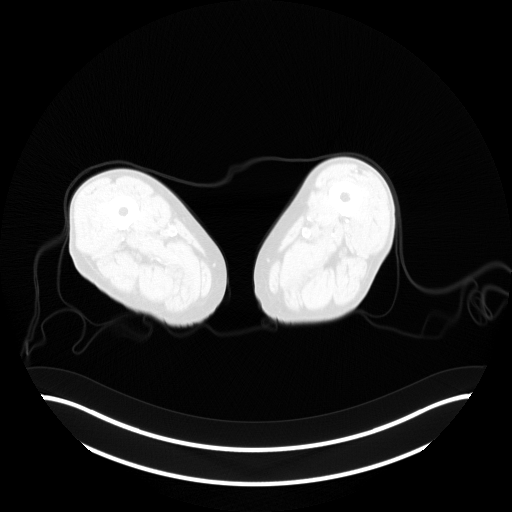
[im 54/215]
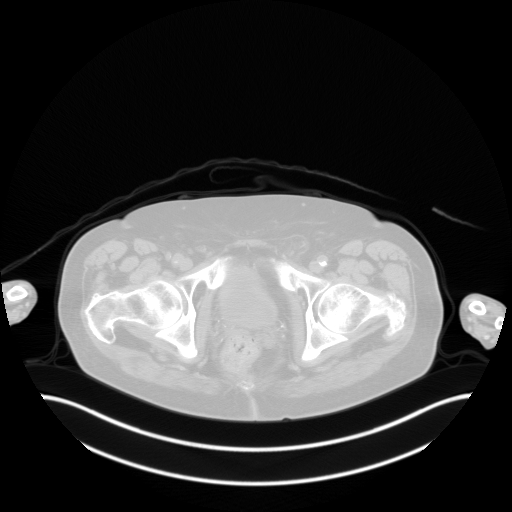
[im 108/215]
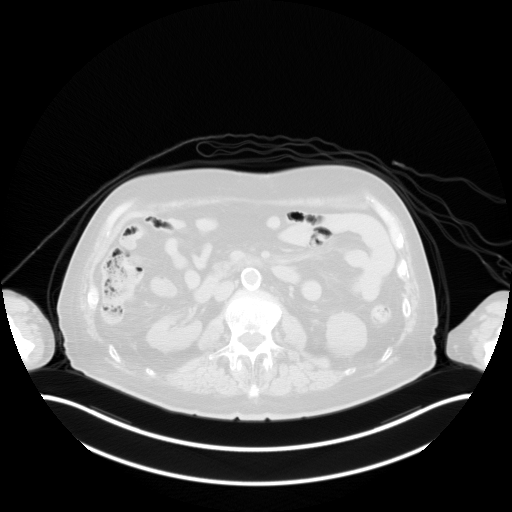
[im 161/215]
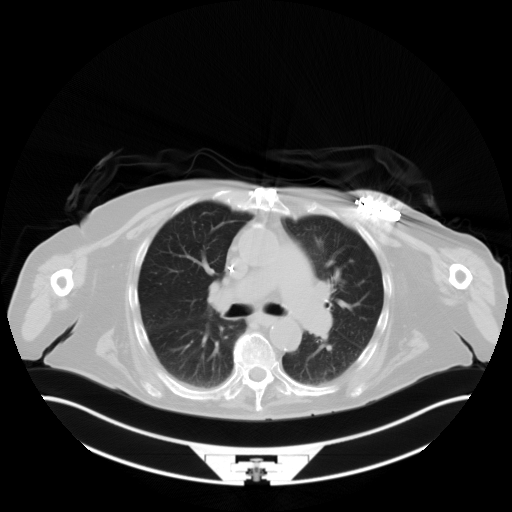
[im 215/215  brain]
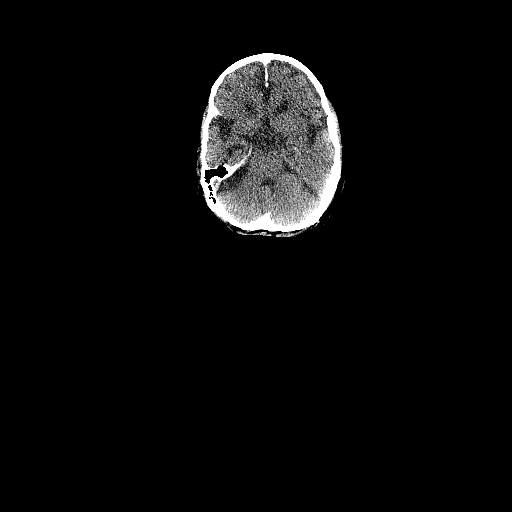

[Series 5: pet sk_thigh nac · axial · 5.0mm · 4.07mm/px · z∈[-1222,-366]mm · 5 of 215 slices shown]
[im 1/215]
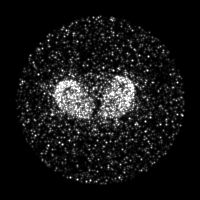
[im 54/215]
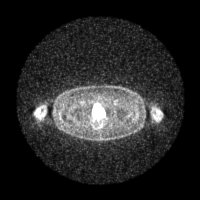
[im 108/215]
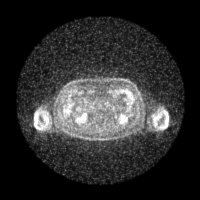
[im 161/215]
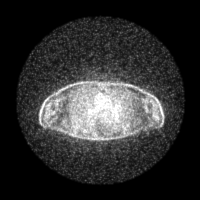
[im 215/215]
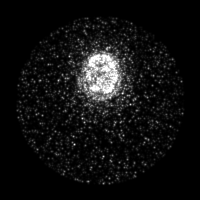

[Series 8: ct sk_thigh 5.0 br59 lung_bone · axial · 5.0mm · 0.65mm/px · z∈[-708,-468]mm · 2 of 61 slices shown]
[im 1/61]
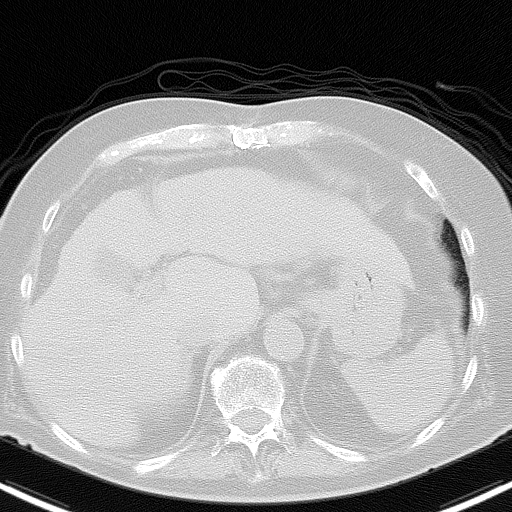
[im 61/61]
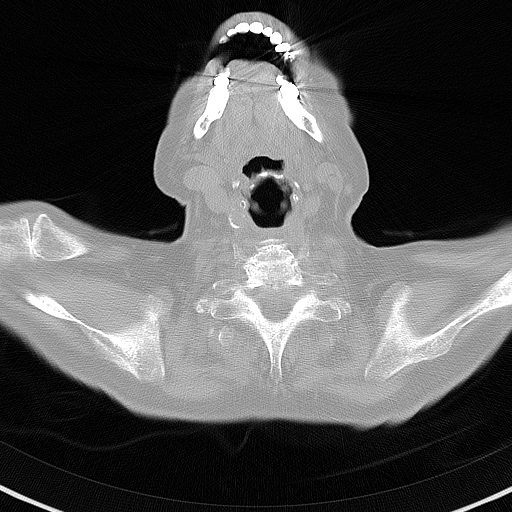

[Series 603: fused cor · 2 of 62 slices shown]
[im 1/62]
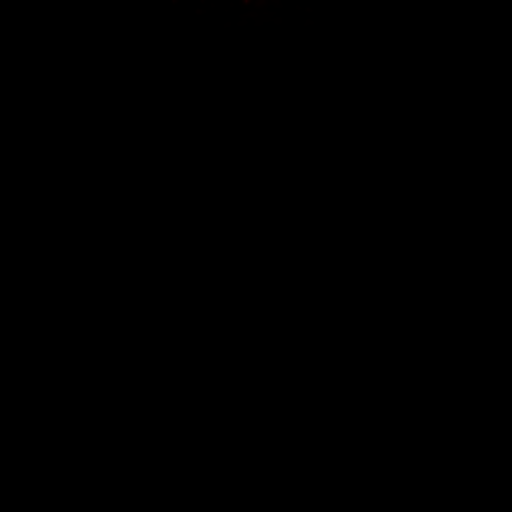
[im 62/62]
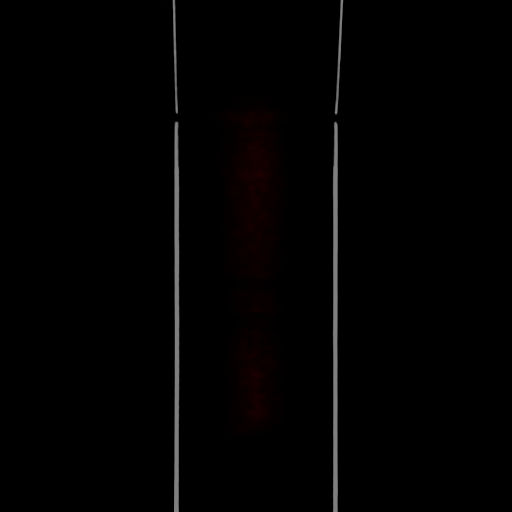

[Series 604: <mip collection> · coronal · 1.78mm/px · 1 of 32 slices shown]
[im 1/32]
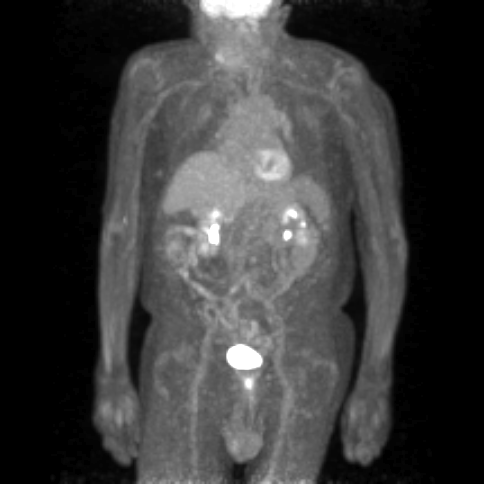

[Series 605: range-ct sk_thigh 5.0 bf37-tra-<alpha range> · 5 of 209 slices shown]
[im 1/209]
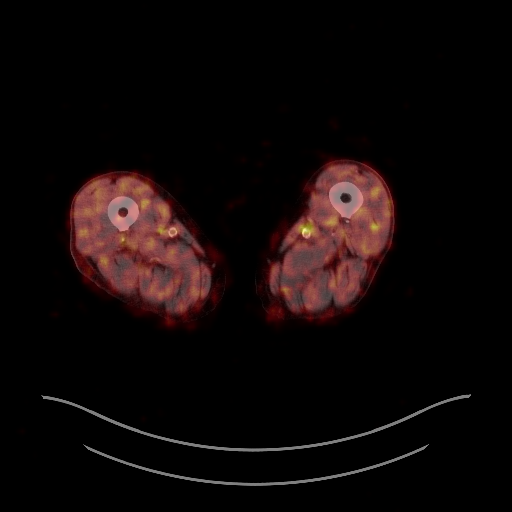
[im 53/209]
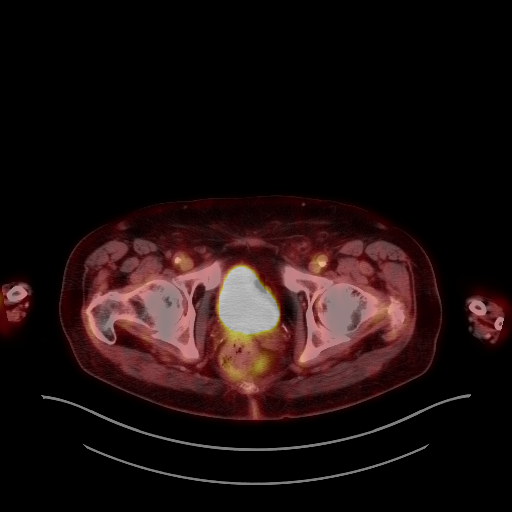
[im 105/209]
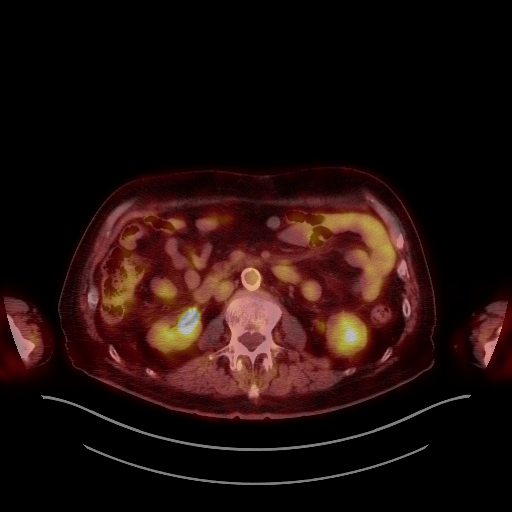
[im 157/209]
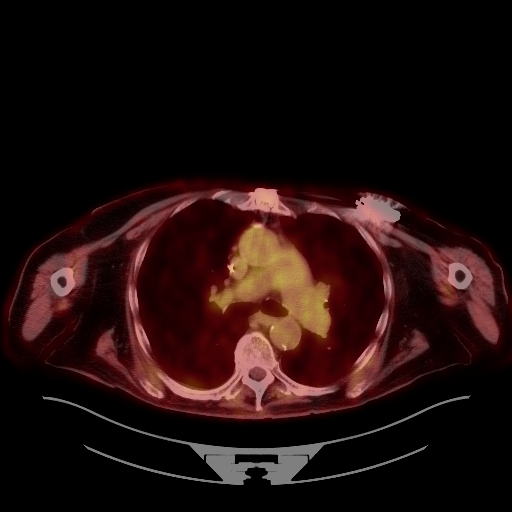
[im 209/209]
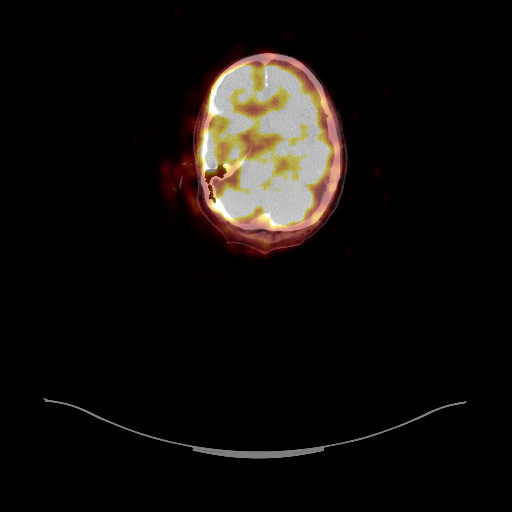

[25 of 25 positions shown; findings below may reference images not displayed]

FINDINGS: Mediastinal blood pool activity: SUV max

Liver activity: SUV max NA

NECK: No hypermetabolic lymph nodes in the neck.

Incidental CT findings: none

CHEST: 1.7 cm short axis right paratracheal node on 49/4 is stable
since prior PET-CT with SUV max = 3.5. Upper normal prevascular
lymph nodes are similar to prior. 11 mm short axis AP window lymph
node on 50/4 is stable with SUV max = 3.2. 12 mm short axis
subcarinal node was 13 mm short axis previously.

The mixed attenuation anterior left upper lobe nodule seen on the
chest CT of 12/13/2021 is not well demonstrated on CT imaging today
(non breath hold technique). No measurable hypermetabolism in the
lesion with SUV max = 1.1.

No other sites of unexpected or suspicious hypermetabolism in the
chest.

Incidental CT findings: Coronary artery calcification is evident.

ABDOMEN/PELVIS: No abnormal hypermetabolic activity within the
liver, pancreas, adrenal glands, or spleen. No hypermetabolic lymph
nodes in the abdomen or pelvis.

Incidental CT findings: Nodular hepatic contour is compatible with
cirrhosis. Densely calcified lesion interpolar left kidney is stable
since 06/09/2019 consistent with benign etiology. Diverticular
changes noted left colon without diverticulitis.

SKELETON: No focal hypermetabolic activity to suggest skeletal
metastasis.

Incidental CT findings: No worrisome lytic or sclerotic osseous
abnormality.
IMPRESSION: 1. Mixed attenuation anterior left upper lobe lesion of concern on
recent diagnostic chest CT shows no substantial hypermetabolism.
While reassuring, low-grade or well differentiated neoplasm can be
poorly FDG avid and continued close attention recommended.
2. Stable mediastinal lymphadenopathy with low level FDG
accumulation comparing back to 06/09/2019. Given lack of appreciable
change, this suggest benign/reactive etiology.
3. Cirrhosis
4.  Aortic Atherosclerois ([id number])
